# Patient Record
Sex: Male | Born: 1965
Health system: Southern US, Community
[De-identification: ages and names within clinical notes are randomized; demographics above are authoritative.]

## PROBLEM LIST (undated history)

## (undated) DIAGNOSIS — I739 Peripheral vascular disease, unspecified: Secondary | ICD-10-CM

## (undated) DIAGNOSIS — J449 Chronic obstructive pulmonary disease, unspecified: Secondary | ICD-10-CM

## (undated) DIAGNOSIS — N289 Disorder of kidney and ureter, unspecified: Secondary | ICD-10-CM

## (undated) DIAGNOSIS — T8859XA Other complications of anesthesia, initial encounter: Secondary | ICD-10-CM

## (undated) DIAGNOSIS — M1990 Unspecified osteoarthritis, unspecified site: Secondary | ICD-10-CM

## (undated) DIAGNOSIS — D62 Acute posthemorrhagic anemia: Secondary | ICD-10-CM

## (undated) DIAGNOSIS — I6529 Occlusion and stenosis of unspecified carotid artery: Secondary | ICD-10-CM

## (undated) DIAGNOSIS — E785 Hyperlipidemia, unspecified: Secondary | ICD-10-CM

## (undated) DIAGNOSIS — Z7982 Long term (current) use of aspirin: Secondary | ICD-10-CM

## (undated) DIAGNOSIS — I2510 Atherosclerotic heart disease of native coronary artery without angina pectoris: Secondary | ICD-10-CM

## (undated) DIAGNOSIS — Z720 Tobacco use: Secondary | ICD-10-CM

## (undated) DIAGNOSIS — Z7902 Long term (current) use of antithrombotics/antiplatelets: Secondary | ICD-10-CM

## (undated) DIAGNOSIS — E119 Type 2 diabetes mellitus without complications: Secondary | ICD-10-CM

## (undated) DIAGNOSIS — T7840XA Allergy, unspecified, initial encounter: Secondary | ICD-10-CM

## (undated) DIAGNOSIS — IMO0002 Reserved for concepts with insufficient information to code with codable children: Secondary | ICD-10-CM

## (undated) DIAGNOSIS — I7772 Dissection of iliac artery: Secondary | ICD-10-CM

## (undated) DIAGNOSIS — I700 Atherosclerosis of aorta: Secondary | ICD-10-CM

## (undated) DIAGNOSIS — K219 Gastro-esophageal reflux disease without esophagitis: Secondary | ICD-10-CM

## (undated) DIAGNOSIS — N400 Enlarged prostate without lower urinary tract symptoms: Secondary | ICD-10-CM

## (undated) DIAGNOSIS — J45909 Unspecified asthma, uncomplicated: Secondary | ICD-10-CM

## (undated) DIAGNOSIS — I779 Disorder of arteries and arterioles, unspecified: Secondary | ICD-10-CM

## (undated) DIAGNOSIS — I10 Essential (primary) hypertension: Secondary | ICD-10-CM

## (undated) DIAGNOSIS — T4145XA Adverse effect of unspecified anesthetic, initial encounter: Secondary | ICD-10-CM

## (undated) DIAGNOSIS — R0989 Other specified symptoms and signs involving the circulatory and respiratory systems: Secondary | ICD-10-CM

## (undated) HISTORY — DX: Unspecified asthma, uncomplicated: J45.909

## (undated) HISTORY — PX: BACK SURGERY: SHX140

## (undated) HISTORY — DX: Reserved for concepts with insufficient information to code with codable children: IMO0002

## (undated) HISTORY — DX: Essential (primary) hypertension: I10

## (undated) HISTORY — DX: Gastro-esophageal reflux disease without esophagitis: K21.9

## (undated) HISTORY — DX: Unspecified osteoarthritis, unspecified site: M19.90

## (undated) HISTORY — DX: Acute posthemorrhagic anemia: D62

## (undated) HISTORY — DX: Allergy, unspecified, initial encounter: T78.40XA

## (undated) HISTORY — PX: SPINE SURGERY: SHX786

---
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
## 2003-11-15 ENCOUNTER — Emergency Department (HOSPITAL_COMMUNITY): Admission: EM | Admit: 2003-11-15 | Discharge: 2003-11-15 | Payer: Self-pay | Admitting: Emergency Medicine

## 2004-07-11 ENCOUNTER — Emergency Department: Payer: Self-pay | Admitting: Emergency Medicine

## 2005-01-13 ENCOUNTER — Emergency Department (HOSPITAL_COMMUNITY): Admission: EM | Admit: 2005-01-13 | Discharge: 2005-01-13 | Payer: Self-pay | Admitting: Emergency Medicine

## 2006-01-14 ENCOUNTER — Emergency Department: Payer: Self-pay | Admitting: Internal Medicine

## 2006-01-23 ENCOUNTER — Ambulatory Visit: Payer: Self-pay | Admitting: Internal Medicine

## 2006-01-26 ENCOUNTER — Emergency Department: Payer: Self-pay | Admitting: Emergency Medicine

## 2006-11-22 ENCOUNTER — Inpatient Hospital Stay: Payer: Self-pay | Admitting: Internal Medicine

## 2006-11-22 ENCOUNTER — Other Ambulatory Visit: Payer: Self-pay

## 2006-11-25 ENCOUNTER — Inpatient Hospital Stay: Payer: Self-pay | Admitting: Unknown Physician Specialty

## 2007-12-02 ENCOUNTER — Ambulatory Visit: Payer: Self-pay | Admitting: Physician Assistant

## 2010-03-21 ENCOUNTER — Ambulatory Visit: Payer: Self-pay | Admitting: Neurosurgery

## 2010-07-04 ENCOUNTER — Other Ambulatory Visit (HOSPITAL_COMMUNITY): Payer: Self-pay | Admitting: Neurosurgery

## 2010-07-04 ENCOUNTER — Ambulatory Visit (HOSPITAL_COMMUNITY)
Admission: RE | Admit: 2010-07-04 | Discharge: 2010-07-04 | Disposition: A | Discharge status: Home / Self Care | Payer: Medicare Other | Source: Ambulatory Visit | Attending: Neurosurgery | Admitting: Neurosurgery

## 2010-07-04 ENCOUNTER — Encounter (HOSPITAL_COMMUNITY)
Admission: RE | Admit: 2010-07-04 | Discharge: 2010-07-04 | Disposition: A | Discharge status: Home / Self Care | Payer: Medicare Other | Source: Ambulatory Visit | Attending: Neurosurgery | Admitting: Neurosurgery

## 2010-07-04 DIAGNOSIS — I10 Essential (primary) hypertension: Secondary | ICD-10-CM | POA: Insufficient documentation

## 2010-07-04 DIAGNOSIS — Z01818 Encounter for other preprocedural examination: Secondary | ICD-10-CM | POA: Insufficient documentation

## 2010-07-04 DIAGNOSIS — Z01812 Encounter for preprocedural laboratory examination: Secondary | ICD-10-CM | POA: Insufficient documentation

## 2010-07-04 DIAGNOSIS — F17200 Nicotine dependence, unspecified, uncomplicated: Secondary | ICD-10-CM | POA: Insufficient documentation

## 2010-07-04 DIAGNOSIS — J438 Other emphysema: Secondary | ICD-10-CM | POA: Insufficient documentation

## 2010-07-04 DIAGNOSIS — Z01811 Encounter for preprocedural respiratory examination: Secondary | ICD-10-CM | POA: Insufficient documentation

## 2010-07-04 DIAGNOSIS — M5126 Other intervertebral disc displacement, lumbar region: Secondary | ICD-10-CM

## 2010-07-04 LAB — CBC
HCT: 46.3 % (ref 39.0–52.0)
Hemoglobin: 16 g/dL (ref 13.0–17.0)
MCH: 31.5 pg (ref 26.0–34.0)
MCV: 91.1 fL (ref 78.0–100.0)
Platelets: 407 10*3/uL — ABNORMAL HIGH (ref 150–400)
RBC: 5.08 MIL/uL (ref 4.22–5.81)
RDW: 13.3 % (ref 11.5–15.5)
WBC: 11 10*3/uL — ABNORMAL HIGH (ref 4.0–10.5)

## 2010-07-04 LAB — SURGICAL PCR SCREEN
MRSA, PCR: NEGATIVE
Staphylococcus aureus: NEGATIVE

## 2010-07-04 LAB — BASIC METABOLIC PANEL
BUN: 8 mg/dL (ref 6–23)
Calcium: 9.5 mg/dL (ref 8.4–10.5)
Chloride: 108 mEq/L (ref 96–112)
Creatinine, Ser: 0.71 mg/dL (ref 0.4–1.5)
GFR calc Af Amer: >60 mL/min (ref >60)
GFR calc non Af Amer: >60 mL/min (ref >60)
Glucose, Bld: 162 mg/dL — ABNORMAL HIGH (ref 70–99)
Potassium: 4.7 mEq/L (ref 3.5–5.1)
Sodium: 136 mEq/L (ref 135–145)

## 2010-07-09 ENCOUNTER — Inpatient Hospital Stay (HOSPITAL_COMMUNITY): Payer: Medicare Other

## 2010-07-09 ENCOUNTER — Inpatient Hospital Stay (HOSPITAL_COMMUNITY)
Admission: RE | Admit: 2010-07-09 | Discharge: 2010-07-11 | DRG: 460 | Disposition: A | Discharge status: Home Health | Payer: Medicare Other | Source: Ambulatory Visit | Attending: Neurosurgery | Admitting: Neurosurgery

## 2010-07-09 DIAGNOSIS — M51379 Other intervertebral disc degeneration, lumbosacral region without mention of lumbar back pain or lower extremity pain: Secondary | ICD-10-CM | POA: Diagnosis present

## 2010-07-09 DIAGNOSIS — I10 Essential (primary) hypertension: Secondary | ICD-10-CM | POA: Diagnosis present

## 2010-07-09 DIAGNOSIS — J449 Chronic obstructive pulmonary disease, unspecified: Secondary | ICD-10-CM | POA: Diagnosis present

## 2010-07-09 DIAGNOSIS — Z01810 Encounter for preprocedural cardiovascular examination: Secondary | ICD-10-CM

## 2010-07-09 DIAGNOSIS — J4489 Other specified chronic obstructive pulmonary disease: Secondary | ICD-10-CM | POA: Diagnosis present

## 2010-07-09 DIAGNOSIS — Z01812 Encounter for preprocedural laboratory examination: Secondary | ICD-10-CM

## 2010-07-09 DIAGNOSIS — F17200 Nicotine dependence, unspecified, uncomplicated: Secondary | ICD-10-CM | POA: Diagnosis present

## 2010-07-09 DIAGNOSIS — E119 Type 2 diabetes mellitus without complications: Secondary | ICD-10-CM | POA: Diagnosis present

## 2010-07-09 DIAGNOSIS — M5126 Other intervertebral disc displacement, lumbar region: Principal | ICD-10-CM | POA: Diagnosis present

## 2010-07-09 DIAGNOSIS — M5137 Other intervertebral disc degeneration, lumbosacral region: Secondary | ICD-10-CM | POA: Diagnosis present

## 2010-07-09 LAB — GLUCOSE, CAPILLARY
Glucose-Capillary: 102 mg/dL — ABNORMAL HIGH (ref 70–99)
Glucose-Capillary: 122 mg/dL — ABNORMAL HIGH (ref 70–99)
Glucose-Capillary: 165 mg/dL — ABNORMAL HIGH (ref 70–99)

## 2010-07-09 LAB — ABO/RH: ABO/RH(D): O POS

## 2010-07-09 LAB — TYPE AND SCREEN
ABO/RH(D): O POS
Antibody Screen: NEGATIVE

## 2010-07-10 LAB — POCT I-STAT 4, (NA,K, GLUC, HGB,HCT)
Glucose, Bld: 120 mg/dL — ABNORMAL HIGH (ref 70–99)
HCT: 36 % — ABNORMAL LOW (ref 39.0–52.0)
Hemoglobin: 12.2 g/dL — ABNORMAL LOW (ref 13.0–17.0)
Potassium: 4.3 mEq/L (ref 3.5–5.1)
Sodium: 139 mEq/L (ref 135–145)

## 2010-07-10 LAB — GLUCOSE, CAPILLARY
Glucose-Capillary: 159 mg/dL — ABNORMAL HIGH (ref 70–99)
Glucose-Capillary: 182 mg/dL — ABNORMAL HIGH (ref 70–99)
Glucose-Capillary: 121 mg/dL — ABNORMAL HIGH (ref 70–99)

## 2010-07-11 LAB — GLUCOSE, CAPILLARY
Glucose-Capillary: 136 mg/dL — ABNORMAL HIGH (ref 70–99)
Glucose-Capillary: 146 mg/dL — ABNORMAL HIGH (ref 70–99)

## 2010-07-12 NOTE — Op Note (Signed)
NAMEMELQUISEDEC, JOURNEY              ACCOUNT NO.:  [id number]  MEDICAL RECORD NO.:  [id number]           PATIENT TYPE:  I  LOCATION:  3013                         FACILITY:  [facility]  PHYSICIAN:  Donalee Citrin, M.D.        DATE OF BIRTH:  05-Aug-1965  DATE OF PROCEDURE:  07/09/2010 DATE OF DISCHARGE:                              OPERATIVE REPORT   PREOPERATIVE DIAGNOSIS:  Recurrent disk herniation, L4-L5 with lumbar spinal stenosis and left L5 radiculopathy.  PROCEDURE:  Redo decompressive laminectomy and posterior lumbar interbody fusion at L4-L5 using hybrid Telamon PEEK cage and Tangent allograft wedge.  Pedicle screw fixation at L4-L5 using the Globus REVERE pedicle screw system.  Posterolateral arthrodesis at L4-L5 using locally harvested autograft mixed with Actifuse and placement of large Hemovac drain.  SURGEON:  Donalee Citrin, MD  ASSISTANT:  Kathaleen Maser. Pool, MD  ANESTHESIA:  General endotracheal.  HISTORY OF PRESENT ILLNESS:  The patient is a 45 year old gentleman who underwent previous laminectomies with diskectomy many years ago and the patient had persistent and progressive back and left leg pain consistent with an L5 nerve root pattern.  MRI scan showed large recurrent disk herniation and severe degeneration of the flaps around the disk space. The patient was recommended reexploration, redo laminectomy, redo diskectomy, and interbody fusion at this level.  I went over the risks and benefits of the operation with him, he understood and agreed to proceed forward.  DESCRIPTION OF PROCEDURE:  The patient was brought to the OR, induced under general anesthesia, and positioned prone on the Wilson frame. Back was prepped and draped in the usual sterile fashion.  Old incision was opened up on the left side.  Scar tissue was dissected free and subperiosteal dissection carried out at lamina of L4-L5 bilaterally.  T- piece at L4-L5 were exposed.  Intraoperative x-ray identified  the appropriate level.  Spinous process was removed at L4.  A complete central decompression was begun first working on the right side where no previous surgery had been performed.  Complete medial facetectomy was performed decompressing the L4 and L5 on that side.  Then working through the scar, extensive amount of scar tissue causing severe stenosis around the L5 and a large recurrent disk herniation encased in scar were underneath the L5.  So working underneath the scar with sharp and blunt dissection, the L5 nerve was freed up off the pedicle and freed up off the recurrent disk herniation which was removed with a nerve hook and pituitary rongeurs.  The thecal sac was then mobilized and after the L4 and L5 were then exposed on the left and adequate decompression, diskectomy had been performed, attention was taken to the pedicle screw placement.  Using high-speed drill, pilot holes were drilled at L4, cannulated with the awl, probed, tapped with 5.5 tap, probed again, and 6 x 45 screw inserted at L4 on the left.  Again fluoroscopy was used at each step along the way as well as external and internal bony landmarks were confirmed no mediolateral breach.  Then the L5 screws were placed in the left and L4-L5 screws were placed on  the right.  After all 4 screws were placed, attention was taken to the interbody work.  The size 12 distractor was inserted on the left side L4- L5.  This was felt to be appropriate size and had good apposition of the endplates.  Disk space was then cleaned out in the right with 12 sized rotating cutter and chisel.  Then a Tangent allograft wedge was inserted on the right side and distractor was removed.  Fluoroscopy confirmed good position of the implant.  Disk space was then cleaned on the left, again with a rotating cutter and chisel.  Local autograft mixed with Actifuse and packed centrally after aggressive scraping of the central endplates with a 0-Epstein  curette.  Then a Telamon PEEK cage 10 x 20 mm packed with locally harvested autograft and mixed with Actifuse was inserted on the patient's left side.  Then after all the interbody workup was done, the wound was copiously irrigated.  Meticulous hemostasis was maintained.  Aggressive decortication was carried at T- piece and lateral gutters.  The remainder of the autograft mixed with Actifuse was then packed posterolaterally.  Then 45 mm rods were then placed and top-tighteners set down at L5.  L4 was compressed against L5. Postop fluoroscopy confirmed good position of the rods and implants. The foramina were reexplored and looked widely patent.  Gelfoam was laid on top of the dura and the wound was closed in layers with Vicryl after placement of a large Hemovac drain and the skin with 4-0 subcuticular. Benzoin and Steri-Strips were applied.  The patient went to the recovery room in stable condition.  At the end of the case, sponge, needle, and instrument counts were correct.          ______________________________ Donalee Citrin, M.D.     GC/MEDQ  D:  07/09/2010  T:  07/10/2010  Job:  [id number]  Electronically Signed by Donalee Citrin M.D. on 07/12/2010 11:26:57 AM

## 2010-07-12 NOTE — Discharge Summary (Signed)
  [name], Benjamin Conley              ACCOUNT NO.:  [id number]  MEDICAL RECORD NO.:  [id number]           PATIENT TYPE:  I  LOCATION:  3013                         FACILITY:  [facility]  PHYSICIAN:  Donalee Citrin, M.D.        DATE OF BIRTH:  1965/05/31  DATE OF ADMISSION:  07/09/2010 DATE OF DISCHARGE:  07/11/2010                              DISCHARGE SUMMARY   ADMITTING DIAGNOSIS:  Degenerative disk disease, recurrent herniated nucleus pulposus at L4-5.  PROCEDURE:  During this hospitalization was redo decompressive lumbar laminectomy and microdiskectomy with posterior lumbar interbody fusion, L4-5.  HOSPITAL COURSE:  The patient was admitted and immediately went the operating room and underwent the aforementioned procedure. Postoperatively, the patient did very well, had significant improvement in preoperative pain in his legs, progressively mobilized over the first couple of postoperative days and by the time of discharge, he was ambulating, voiding spontaneously, tolerating a regular diet, tolerating pain on p.o. pain medication, wound was clean and dry.  The patient was able to be discharged home with scheduled followup in approximately 1-2 weeks.  He was discharged on Percocet and Flexeril.  FINAL DIAGNOSIS:  Recurrent herniated nucleus pulposus at L4-5 and degenerative disk disease.          ______________________________ Donalee Citrin, M.D.     GC/MEDQ  D:  07/11/2010  T:  07/11/2010  Job:  [id number]  Electronically Signed by Donalee Citrin M.D. on 07/12/2010 11:27:00 AM

## 2010-08-09 ENCOUNTER — Ambulatory Visit
Admission: RE | Admit: 2010-08-09 | Discharge: 2010-08-09 | Disposition: A | Discharge status: Home / Self Care | Payer: Medicare Other | Source: Ambulatory Visit | Attending: Neurosurgery | Admitting: Neurosurgery

## 2010-08-09 ENCOUNTER — Other Ambulatory Visit: Payer: Self-pay | Admitting: Neurosurgery

## 2010-08-09 DIAGNOSIS — M545 Low back pain, unspecified: Secondary | ICD-10-CM

## 2010-12-05 ENCOUNTER — Ambulatory Visit: Payer: Self-pay | Admitting: Neurosurgery

## 2011-10-07 ENCOUNTER — Ambulatory Visit: Payer: Self-pay | Admitting: Neurosurgery

## 2013-08-30 ENCOUNTER — Emergency Department: Payer: Self-pay | Admitting: Emergency Medicine

## 2013-08-30 LAB — CBC
HCT: 47.8 % (ref 40.0–52.0)
HGB: 16 g/dL (ref 13.0–18.0)
MCH: 30.3 pg (ref 26.0–34.0)
MCHC: 33.5 g/dL (ref 32.0–36.0)
MCV: 90 fL (ref 80–100)
Platelet: 358 10*3/uL (ref 150–440)
RBC: 5.29 10*6/uL (ref 4.40–5.90)
RDW: 13.5 % (ref 11.5–14.5)
WBC: 10.5 10*3/uL (ref 3.8–10.6)

## 2013-08-31 LAB — BASIC METABOLIC PANEL
Anion Gap: 13 (ref 7–16)
BUN: 13 mg/dL (ref 7–18)
CHLORIDE: 101 mmol/L (ref 98–107)
Calcium, Total: 9.1 mg/dL (ref 8.5–10.1)
Co2: 23 mmol/L (ref 21–32)
Creatinine: 0.69 mg/dL (ref 0.60–1.30)
EGFR (African American): >60
EGFR (Non-African Amer.): >60
Glucose: 374 mg/dL — ABNORMAL HIGH (ref 65–99)
Osmolality: 289 (ref 275–301)
Potassium: 3.5 mmol/L (ref 3.5–5.1)
Sodium: 137 mmol/L (ref 136–145)

## 2013-08-31 LAB — TROPONIN I: Troponin-I: <0.02 ng/mL

## 2013-11-10 DIAGNOSIS — I10 Essential (primary) hypertension: Secondary | ICD-10-CM | POA: Insufficient documentation

## 2014-03-18 DIAGNOSIS — E119 Type 2 diabetes mellitus without complications: Secondary | ICD-10-CM | POA: Diagnosis not present

## 2014-03-18 DIAGNOSIS — E785 Hyperlipidemia, unspecified: Secondary | ICD-10-CM | POA: Diagnosis not present

## 2014-04-28 DIAGNOSIS — M5416 Radiculopathy, lumbar region: Secondary | ICD-10-CM | POA: Diagnosis not present

## 2014-04-28 DIAGNOSIS — Z6833 Body mass index (BMI) 33.0-33.9, adult: Secondary | ICD-10-CM | POA: Diagnosis not present

## 2014-04-28 DIAGNOSIS — M5137 Other intervertebral disc degeneration, lumbosacral region: Secondary | ICD-10-CM | POA: Diagnosis not present

## 2014-05-06 DIAGNOSIS — M5416 Radiculopathy, lumbar region: Secondary | ICD-10-CM | POA: Diagnosis not present

## 2014-05-12 DIAGNOSIS — M5126 Other intervertebral disc displacement, lumbar region: Secondary | ICD-10-CM | POA: Diagnosis not present

## 2014-05-12 DIAGNOSIS — M5416 Radiculopathy, lumbar region: Secondary | ICD-10-CM | POA: Diagnosis not present

## 2014-05-12 DIAGNOSIS — Z6834 Body mass index (BMI) 34.0-34.9, adult: Secondary | ICD-10-CM | POA: Diagnosis not present

## 2014-05-12 DIAGNOSIS — M4806 Spinal stenosis, lumbar region: Secondary | ICD-10-CM | POA: Diagnosis not present

## 2014-05-12 DIAGNOSIS — M5137 Other intervertebral disc degeneration, lumbosacral region: Secondary | ICD-10-CM | POA: Diagnosis not present

## 2014-05-30 ENCOUNTER — Encounter
Admit: 2014-05-30 | Disposition: A | Discharge status: Home / Self Care | Payer: Self-pay | Attending: Neurosurgery | Admitting: Neurosurgery

## 2014-05-30 DIAGNOSIS — R262 Difficulty in walking, not elsewhere classified: Secondary | ICD-10-CM | POA: Diagnosis not present

## 2014-05-30 DIAGNOSIS — M5418 Radiculopathy, sacral and sacrococcygeal region: Secondary | ICD-10-CM | POA: Diagnosis not present

## 2014-05-30 DIAGNOSIS — M545 Low back pain: Secondary | ICD-10-CM | POA: Diagnosis not present

## 2014-06-02 DIAGNOSIS — R262 Difficulty in walking, not elsewhere classified: Secondary | ICD-10-CM | POA: Diagnosis not present

## 2014-06-02 DIAGNOSIS — M545 Low back pain: Secondary | ICD-10-CM | POA: Diagnosis not present

## 2014-06-02 DIAGNOSIS — M5418 Radiculopathy, sacral and sacrococcygeal region: Secondary | ICD-10-CM | POA: Diagnosis not present

## 2014-06-06 DIAGNOSIS — M5418 Radiculopathy, sacral and sacrococcygeal region: Secondary | ICD-10-CM | POA: Diagnosis not present

## 2014-06-06 DIAGNOSIS — M545 Low back pain: Secondary | ICD-10-CM | POA: Diagnosis not present

## 2014-06-06 DIAGNOSIS — R262 Difficulty in walking, not elsewhere classified: Secondary | ICD-10-CM | POA: Diagnosis not present

## 2014-06-09 DIAGNOSIS — R262 Difficulty in walking, not elsewhere classified: Secondary | ICD-10-CM | POA: Diagnosis not present

## 2014-06-09 DIAGNOSIS — M545 Low back pain: Secondary | ICD-10-CM | POA: Diagnosis not present

## 2014-06-09 DIAGNOSIS — M5418 Radiculopathy, sacral and sacrococcygeal region: Secondary | ICD-10-CM | POA: Diagnosis not present

## 2014-06-10 ENCOUNTER — Encounter
Admit: 2014-06-10 | Disposition: A | Discharge status: Home / Self Care | Payer: Self-pay | Attending: Neurosurgery | Admitting: Neurosurgery

## 2014-06-13 DIAGNOSIS — R262 Difficulty in walking, not elsewhere classified: Secondary | ICD-10-CM | POA: Diagnosis not present

## 2014-06-13 DIAGNOSIS — M545 Low back pain: Secondary | ICD-10-CM | POA: Diagnosis not present

## 2014-06-13 DIAGNOSIS — M5416 Radiculopathy, lumbar region: Secondary | ICD-10-CM | POA: Diagnosis not present

## 2014-06-13 DIAGNOSIS — M79605 Pain in left leg: Secondary | ICD-10-CM | POA: Diagnosis not present

## 2014-06-15 DIAGNOSIS — M79605 Pain in left leg: Secondary | ICD-10-CM | POA: Diagnosis not present

## 2014-06-15 DIAGNOSIS — M545 Low back pain: Secondary | ICD-10-CM | POA: Diagnosis not present

## 2014-06-15 DIAGNOSIS — R262 Difficulty in walking, not elsewhere classified: Secondary | ICD-10-CM | POA: Diagnosis not present

## 2014-06-15 DIAGNOSIS — M5416 Radiculopathy, lumbar region: Secondary | ICD-10-CM | POA: Diagnosis not present

## 2014-06-20 DIAGNOSIS — M5416 Radiculopathy, lumbar region: Secondary | ICD-10-CM | POA: Diagnosis not present

## 2014-06-20 DIAGNOSIS — M79605 Pain in left leg: Secondary | ICD-10-CM | POA: Diagnosis not present

## 2014-06-20 DIAGNOSIS — M545 Low back pain: Secondary | ICD-10-CM | POA: Diagnosis not present

## 2014-06-20 DIAGNOSIS — R262 Difficulty in walking, not elsewhere classified: Secondary | ICD-10-CM | POA: Diagnosis not present

## 2014-06-22 DIAGNOSIS — R262 Difficulty in walking, not elsewhere classified: Secondary | ICD-10-CM | POA: Diagnosis not present

## 2014-06-22 DIAGNOSIS — M79605 Pain in left leg: Secondary | ICD-10-CM | POA: Diagnosis not present

## 2014-06-22 DIAGNOSIS — M5416 Radiculopathy, lumbar region: Secondary | ICD-10-CM | POA: Diagnosis not present

## 2014-06-22 DIAGNOSIS — M545 Low back pain: Secondary | ICD-10-CM | POA: Diagnosis not present

## 2014-06-23 DIAGNOSIS — M5137 Other intervertebral disc degeneration, lumbosacral region: Secondary | ICD-10-CM | POA: Diagnosis not present

## 2014-07-18 DIAGNOSIS — E119 Type 2 diabetes mellitus without complications: Secondary | ICD-10-CM | POA: Diagnosis not present

## 2014-07-18 DIAGNOSIS — E785 Hyperlipidemia, unspecified: Secondary | ICD-10-CM | POA: Diagnosis not present

## 2014-07-25 DIAGNOSIS — E785 Hyperlipidemia, unspecified: Secondary | ICD-10-CM | POA: Diagnosis not present

## 2014-07-25 DIAGNOSIS — Z0000 Encounter for general adult medical examination without abnormal findings: Secondary | ICD-10-CM | POA: Diagnosis not present

## 2014-07-25 DIAGNOSIS — E119 Type 2 diabetes mellitus without complications: Secondary | ICD-10-CM | POA: Diagnosis not present

## 2014-08-02 DIAGNOSIS — Z0000 Encounter for general adult medical examination without abnormal findings: Secondary | ICD-10-CM | POA: Diagnosis not present

## 2014-08-02 DIAGNOSIS — Z23 Encounter for immunization: Secondary | ICD-10-CM | POA: Diagnosis not present

## 2014-09-27 DIAGNOSIS — M5126 Other intervertebral disc displacement, lumbar region: Secondary | ICD-10-CM | POA: Diagnosis not present

## 2014-11-02 ENCOUNTER — Emergency Department
Admission: EM | Admit: 2014-11-02 | Discharge: 2014-11-02 | Disposition: A | Discharge status: Home / Self Care | Payer: Commercial Managed Care - HMO | Attending: Emergency Medicine | Admitting: Emergency Medicine

## 2014-11-02 DIAGNOSIS — R609 Edema, unspecified: Secondary | ICD-10-CM | POA: Diagnosis not present

## 2014-11-02 DIAGNOSIS — Z79899 Other long term (current) drug therapy: Secondary | ICD-10-CM | POA: Diagnosis not present

## 2014-11-02 DIAGNOSIS — Y92008 Other place in unspecified non-institutional (private) residence as the place of occurrence of the external cause: Secondary | ICD-10-CM | POA: Insufficient documentation

## 2014-11-02 DIAGNOSIS — Y998 Other external cause status: Secondary | ICD-10-CM | POA: Diagnosis not present

## 2014-11-02 DIAGNOSIS — E119 Type 2 diabetes mellitus without complications: Secondary | ICD-10-CM | POA: Diagnosis not present

## 2014-11-02 DIAGNOSIS — T782XXA Anaphylactic shock, unspecified, initial encounter: Secondary | ICD-10-CM

## 2014-11-02 DIAGNOSIS — R0600 Dyspnea, unspecified: Secondary | ICD-10-CM | POA: Diagnosis present

## 2014-11-02 DIAGNOSIS — Y9389 Activity, other specified: Secondary | ICD-10-CM | POA: Diagnosis not present

## 2014-11-02 DIAGNOSIS — T63481A Toxic effect of venom of other arthropod, accidental (unintentional), initial encounter: Secondary | ICD-10-CM

## 2014-11-02 DIAGNOSIS — Z720 Tobacco use: Secondary | ICD-10-CM | POA: Insufficient documentation

## 2014-11-02 DIAGNOSIS — T63461A Toxic effect of venom of wasps, accidental (unintentional), initial encounter: Secondary | ICD-10-CM | POA: Diagnosis not present

## 2014-11-02 DIAGNOSIS — T63441A Toxic effect of venom of bees, accidental (unintentional), initial encounter: Secondary | ICD-10-CM | POA: Diagnosis not present

## 2014-11-02 DIAGNOSIS — W57XXXA Bitten or stung by nonvenomous insect and other nonvenomous arthropods, initial encounter: Secondary | ICD-10-CM | POA: Diagnosis not present

## 2014-11-02 HISTORY — DX: Type 2 diabetes mellitus without complications: E11.9

## 2014-11-02 HISTORY — DX: Hyperlipidemia, unspecified: E78.5

## 2014-11-02 MED ORDER — EPINEPHRINE 0.3 MG/0.3ML IJ SOAJ: 0.3000 mg | Freq: Once | INTRAMUSCULAR | Status: DC

## 2014-11-02 MED ORDER — METHYLPREDNISOLONE SODIUM SUCC 125 MG IJ SOLR
125.0000 mg | Freq: Once | INTRAMUSCULAR | Status: AC
  Administered 2014-11-02: 125 mg via INTRAVENOUS

## 2014-11-02 MED ORDER — EPINEPHRINE HCL 1 MG/ML IJ SOLN
INTRAMUSCULAR | Status: AC
  Administered 2014-11-02: 0.3 mg via INTRAMUSCULAR
  Filled 2014-11-02: qty 1

## 2014-11-02 MED ORDER — DIPHENHYDRAMINE HCL 25 MG PO TABS: 25.0000 mg | ORAL_TABLET | Freq: Four times a day (QID) | ORAL | Status: DC | PRN

## 2014-11-02 MED ORDER — FAMOTIDINE IN NACL 20-0.9 MG/50ML-% IV SOLN
20.0000 mg | Freq: Once | INTRAVENOUS | Status: AC
  Administered 2014-11-02: 20 mg via INTRAVENOUS
  Filled 2014-11-02: qty 50

## 2014-11-02 MED ORDER — METHYLPREDNISOLONE SODIUM SUCC 125 MG IJ SOLR
INTRAMUSCULAR | Status: AC
  Administered 2014-11-02: 125 mg via INTRAVENOUS
  Filled 2014-11-02: qty 2

## 2014-11-02 MED ORDER — PREDNISONE 20 MG PO TABS: 40.0000 mg | ORAL_TABLET | Freq: Every day | ORAL | Status: DC

## 2014-11-02 MED ORDER — EPINEPHRINE HCL 1 MG/ML IJ SOLN
0.3000 mg | Freq: Once | INTRAMUSCULAR | Status: AC
  Administered 2014-11-02: 0.3 mg via INTRAMUSCULAR

## 2014-11-02 NOTE — Discharge Instructions (Signed)
Anaphylactic Reaction °An anaphylactic reaction is a sudden, severe allergic reaction that involves the whole body. It can be life threatening. A hospital stay is often required. People with asthma, eczema, or hay fever are slightly more likely to have an anaphylactic reaction. °CAUSES  °An anaphylactic reaction may be caused by anything to which you are allergic. After being exposed to the allergic substance, your immune system becomes sensitized to it. When you are exposed to that allergic substance again, an allergic reaction can occur. Common causes of an anaphylactic reaction include: °· Medicines. °· Foods, especially peanuts, wheat, shellfish, milk, and eggs. °· Insect bites or stings. °· Blood products. °· Chemicals, such as dyes, latex, and contrast material used for imaging tests. °SYMPTOMS  °When an allergic reaction occurs, the body releases histamine and other substances. These substances cause symptoms such as tightening of the airway. Symptoms often develop within seconds or minutes of exposure. Symptoms may include: °· Skin rash or hives. °· Itching. °· Chest tightness. °· Swelling of the eyes, tongue, or lips. °· Trouble breathing or swallowing. °· Lightheadedness or fainting. °· Anxiety or confusion. °· Stomach pains, vomiting, or diarrhea. °· Nasal congestion. °· A fast or irregular heartbeat (palpitations). °DIAGNOSIS  °Diagnosis is based on your history of recent exposure to allergic substances, your symptoms, and a physical exam. Your caregiver may also perform blood or urine tests to confirm the diagnosis. °TREATMENT  °Epinephrine medicine is the main treatment for an anaphylactic reaction. Other medicines that may be used for treatment include antihistamines, steroids, and albuterol. In severe cases, fluids and medicine to support blood pressure may be given through an intravenous line (IV). Even if you improve after treatment, you need to be observed to make sure your condition does not get  worse. This may require a stay in the hospital. °HOME CARE INSTRUCTIONS  °· Wear a medical alert bracelet or necklace stating your allergy. °· You and your family must learn how to use an anaphylaxis kit or give an epinephrine injection to temporarily treat an emergency allergic reaction. Always carry your epinephrine injection or anaphylaxis kit with you. This can be lifesaving if you have a severe reaction. °· Do not drive or perform tasks after treatment until the medicines used to treat your reaction have worn off, or until your caregiver says it is okay. °· If you have hives or a rash: °¨ Take medicines as directed by your caregiver. °¨ You may use an over-the-counter antihistamine (diphenhydramine) as needed. °¨ Apply cold compresses to the skin or take baths in cool water. Avoid hot baths or showers. °SEEK MEDICAL CARE IF:  °· You develop symptoms of an allergic reaction to a new substance. Symptoms may start right away or minutes later. °· You develop a rash, hives, or itching. °· You develop new symptoms. °SEEK IMMEDIATE MEDICAL CARE IF:  °· You have swelling of the mouth, difficulty breathing, or wheezing. °· You have a tight feeling in your chest or throat. °· You develop hives, swelling, or itching all over your body. °· You develop severe vomiting or diarrhea. °· You feel faint or pass out. °This is an emergency. Use your epinephrine injection or anaphylaxis kit as you have been instructed. Call your local emergency services (911 in [place] Even if you improve after the injection, you need to be examined at a hospital emergency department. °MAKE SURE YOU:  °· Understand these instructions. °· Will watch your condition. °· Will get help right away if you are not   doing well or get worse. Document Released: 02/25/2005 Document Revised: 03/02/2013 Document Reviewed: 05/29/2011 [facility] Patient Information 2015 [place], [place]. This information is not intended to replace advice given to you by your health  care provider. Make sure you discuss any questions you have with your health care provider.

## 2014-11-02 NOTE — ED Provider Notes (Signed)
[facility] Emergency Department Provider Note  ____________________________________________  Time seen: 1:05 PM on arrival by EMS  I have reviewed the triage vital signs and the nursing notes.   HISTORY  Chief Complaint Allergic Reaction    HPI Benjamin Conley is a 49 y.o. male who was at home when he was stung by wasp from 12:30 PM today. He had the sensation of throat swelling and difficulty breathing, diffuse rapid onset of hives, and vomiting. Denies any abdominal pain chest pain or syncope. He is no known wasp allergy although he does note that this past weekend he was stung by a bee or wasp of some sort and had a very large localized reaction with redness and swelling.  EMS gave the patient 50 mg of oral Benadryl   Past Medical History  Diagnosis Date  . Diabetes mellitus without complication   . Hyperlipemia     There are no active problems to display for this patient.   Past Surgical History  Procedure Laterality Date  . Back surgery      lumbar    Current Outpatient Rx  Name  Route  Sig  Dispense  Refill  . atorvastatin (LIPITOR) 40 MG tablet   Oral   Take 40 mg by mouth daily.         . metFORMIN (GLUCOPHAGE) 500 MG tablet   Oral   Take by mouth 2 (two) times daily with a meal.         . diphenhydrAMINE (BENADRYL) 25 MG tablet   Oral   Take 1 tablet (25 mg total) by mouth every 6 (six) hours as needed.   30 tablet   0   . EPINEPHrine 0.3 mg/0.3 mL IJ SOAJ injection   Intramuscular   Inject 0.3 mLs (0.3 mg total) into the muscle once. Follow package instructions as needed for severe allergy or anaphylactic reaction.   1 Device   2   . predniSONE (DELTASONE) 20 MG tablet   Oral   Take 2 tablets (40 mg total) by mouth daily.   8 tablet   0     Allergies Wasp venom  No family history on file.  Social History Social History  Substance Use Topics  . Smoking status: Current Every Day Smoker -- 1.00 packs/day     Types: Cigarettes  . Smokeless tobacco: Never Used  . Alcohol Use: No    Review of Systems  Constitutional: No fever or chills. No weight changes Eyes:No blurry vision or double vision.  ENT: No sore throat. Feels like throat is swollen, difficulty swallowing Cardiovascular: No chest pain. Respiratory: Shortness of breath Gastrointestinal: No abdominal pain at this time, recent vomiting during EMS transport.  No BRBPR or melena. Genitourinary: Negative for dysuria, urinary retention, bloody urine, or difficulty urinating. Musculoskeletal: Negative for back pain. No joint swelling or pain. Skin: Negative for rash. Neurological: Negative for headaches, focal weakness or numbness. Psychiatric:No anxiety or depression.   Endocrine:No hot/cold intolerance, changes in energy, or sleep difficulty.  10-point ROS otherwise negative.  ____________________________________________   PHYSICAL EXAM:  VITAL SIGNS: ED Triage Vitals  Enc Vitals Group     BP 11/02/14 1317 137/106 mmHg     Pulse Rate 11/02/14 1315 128     Resp 11/02/14 1315 30     Temp 11/02/14 1317 97.6 F (36.4 C)     Temp Source 11/02/14 1317 Oral     SpO2 11/02/14 1315 96 %     Weight 11/02/14  1317 197 lb (89.359 kg)     Height 11/02/14 1317 5\' 6"  (1.676 m)     Head Cir --      Peak Flow --      Pain Score --      Pain Loc --      Pain Edu? --      Excl. in [place]? --      Constitutional: Alert and oriented. Moderate distress. Eyes: No scleral icterus. No conjunctival pallor. PERRL. EOMI ENT   Head: Normocephalic and atraumatic.   Nose: No congestion/rhinnorhea. No septal hematoma   Mouth/Throat: MMM, no pharyngeal erythema. No peritonsillar mass. No uvula shift.   Neck: No stridor. No SubQ emphysema. No meningismus. Hematological/Lymphatic/Immunilogical: No cervical lymphadenopathy. Cardiovascular: Tachycardia heart rate 1:30. Normal and symmetric distal pulses are present in all extremities.  No murmurs, rubs, or gallops. Respiratory: Tachypnea restaurant rate 26-30, no retractions Breath sounds are clear and equal bilaterally. No wheezes/rales/rhonchi. Gastrointestinal: Soft and nontender. No distention. There is no CVA tenderness.  No rebound, rigidity, or guarding. Genitourinary: deferred Musculoskeletal: Nontender with normal range of motion in all extremities. No joint effusions.  No lower extremity tenderness.  No edema. Neurologic:   Normal speech and language.  CN 2-10 normal. Motor grossly intact. No pronator drift.  Normal gait. No gross focal neurologic deficits are appreciated.  Skin:  Skin is warm, dry and intact. Diffuse urticarial rash over all extremities and the trunk. Psychiatric: Mood and affect are normal. Speech and behavior are normal. Patient exhibits appropriate insight and judgment.  ____________________________________________    LABS (pertinent positives/negatives) (all labs ordered are listed, but only abnormal results are displayed) Labs Reviewed - No data to display ____________________________________________   EKG    ____________________________________________    RADIOLOGY    ____________________________________________   PROCEDURES CRITICAL CARE Performed by: Joni Fears, Wanona Stare   Total critical care time: 35 minutes  Critical care time was exclusive of separately billable procedures and treating other patients.  Critical care was necessary to treat or prevent imminent or life-threatening deterioration.  Critical care was time spent personally by me on the following activities: development of treatment plan with patient and/or surrogate as well as nursing, discussions with consultants, evaluation of patient's response to treatment, examination of patient, obtaining history from patient or surrogate, ordering and performing treatments and interventions, ordering and review of laboratory studies, ordering and review of  radiographic studies, pulse oximetry and re-evaluation of patient's condition. ____________________________________________   INITIAL IMPRESSION / ASSESSMENT AND PLAN / ED COURSE  Pertinent labs & imaging results that were available during my care of the patient were reviewed by me and considered in my medical decision making (see chart for details).  Patient presents with anaphylaxis with multiorgan system involvement after being stung by likely allergen from a wasp. He is tachycardic and tachypnea, so treat him with IV Pepcid and Solu-Medrol and intramuscular epinephrine. He is artery given 50 mg of oral Benadryl by EMS, as we will hold off on more Benadryl and await the onset of the oral Benadryl. We'll continue to monitor the patient given IV fluids in the meantime.  ----------------------------------------- 2:48 PM on 11/02/2014 -----------------------------------------  Patient improved. His reassessed at 1:30 and 2:00 and continues improving. Rash is still present but slightly better, vital signs are improving with rest or at about 20 and heart rate about 110. Blood pressure remained stable on oxygen level is stable. We'll keep the patient on steroids and antihistamines and provide a prescription for EpiPen and  have him follow-up with his primary care doctor in a few days.  ____________________________________________   FINAL CLINICAL IMPRESSION(S) / ED DIAGNOSES  Final diagnoses:  Anaphylaxis due to hymenoptera venom, accidental or unintentional, initial encounter      Carrie Mew, MD 11/02/14 [phone]

## 2014-11-02 NOTE — ED Notes (Signed)
Pt rash has improved, respirations WNL, states his throat does not feel as swollen..will continue to monitor the pt.

## 2014-11-02 NOTE — ED Notes (Signed)
Pt comes into the ED via EMS from home with c/o being stung by a wasp today around 1230pm and had sudden onset hives with feeling like he throat was closing.Marland KitchenEMS reports giving the pt benadryl 50mg  PO in route.Marland Kitchenrespirations WNL on arrival with ST noted on the monitor at 145.[name]

## 2014-11-02 NOTE — ED Notes (Signed)
Pt O2 sats dropped to 89% on RA, pt placed on 3L [place] and sats returned to 93-95%.[name]

## 2014-11-21 DIAGNOSIS — E119 Type 2 diabetes mellitus without complications: Secondary | ICD-10-CM | POA: Diagnosis not present

## 2014-11-21 DIAGNOSIS — E785 Hyperlipidemia, unspecified: Secondary | ICD-10-CM | POA: Diagnosis not present

## 2014-11-25 DIAGNOSIS — E119 Type 2 diabetes mellitus without complications: Secondary | ICD-10-CM | POA: Diagnosis not present

## 2014-11-25 DIAGNOSIS — E785 Hyperlipidemia, unspecified: Secondary | ICD-10-CM | POA: Diagnosis not present

## 2014-11-25 DIAGNOSIS — Z794 Long term (current) use of insulin: Secondary | ICD-10-CM | POA: Diagnosis not present

## 2015-02-15 DIAGNOSIS — M79652 Pain in left thigh: Secondary | ICD-10-CM | POA: Diagnosis not present

## 2015-02-15 DIAGNOSIS — M25562 Pain in left knee: Secondary | ICD-10-CM | POA: Diagnosis not present

## 2015-03-22 DIAGNOSIS — I10 Essential (primary) hypertension: Secondary | ICD-10-CM | POA: Diagnosis not present

## 2015-03-22 DIAGNOSIS — Z0000 Encounter for general adult medical examination without abnormal findings: Secondary | ICD-10-CM | POA: Diagnosis not present

## 2015-03-22 DIAGNOSIS — E119 Type 2 diabetes mellitus without complications: Secondary | ICD-10-CM | POA: Diagnosis not present

## 2015-03-28 DIAGNOSIS — I10 Essential (primary) hypertension: Secondary | ICD-10-CM | POA: Diagnosis not present

## 2015-03-28 DIAGNOSIS — Z794 Long term (current) use of insulin: Secondary | ICD-10-CM | POA: Diagnosis not present

## 2015-03-28 DIAGNOSIS — E119 Type 2 diabetes mellitus without complications: Secondary | ICD-10-CM | POA: Diagnosis not present

## 2015-03-28 DIAGNOSIS — Z79899 Other long term (current) drug therapy: Secondary | ICD-10-CM | POA: Diagnosis not present

## 2015-06-19 DIAGNOSIS — E119 Type 2 diabetes mellitus without complications: Secondary | ICD-10-CM | POA: Diagnosis not present

## 2015-06-19 DIAGNOSIS — Z794 Long term (current) use of insulin: Secondary | ICD-10-CM | POA: Diagnosis not present

## 2015-06-19 DIAGNOSIS — Z79899 Other long term (current) drug therapy: Secondary | ICD-10-CM | POA: Diagnosis not present

## 2015-06-26 DIAGNOSIS — R05 Cough: Secondary | ICD-10-CM | POA: Diagnosis not present

## 2015-11-16 DIAGNOSIS — Z1211 Encounter for screening for malignant neoplasm of colon: Secondary | ICD-10-CM | POA: Diagnosis not present

## 2015-11-16 DIAGNOSIS — K625 Hemorrhage of anus and rectum: Secondary | ICD-10-CM | POA: Diagnosis not present

## 2015-11-16 DIAGNOSIS — R197 Diarrhea, unspecified: Secondary | ICD-10-CM | POA: Diagnosis not present

## 2015-11-30 DIAGNOSIS — J45991 Cough variant asthma: Secondary | ICD-10-CM | POA: Diagnosis not present

## 2015-11-30 DIAGNOSIS — Z01818 Encounter for other preprocedural examination: Secondary | ICD-10-CM | POA: Diagnosis not present

## 2015-11-30 DIAGNOSIS — R05 Cough: Secondary | ICD-10-CM | POA: Diagnosis not present

## 2016-03-12 ENCOUNTER — Ambulatory Visit: Admit: 2016-03-12 | Payer: Medicare Other | Admitting: Gastroenterology

## 2016-03-12 SURGERY — COLONOSCOPY WITH PROPOFOL
Anesthesia: General

## 2018-01-30 DIAGNOSIS — H1089 Other conjunctivitis: Secondary | ICD-10-CM | POA: Diagnosis not present

## 2018-02-02 DIAGNOSIS — Z0100 Encounter for examination of eyes and vision without abnormal findings: Secondary | ICD-10-CM | POA: Diagnosis not present

## 2018-02-02 DIAGNOSIS — H524 Presbyopia: Secondary | ICD-10-CM | POA: Diagnosis not present

## 2018-02-02 DIAGNOSIS — E119 Type 2 diabetes mellitus without complications: Secondary | ICD-10-CM | POA: Diagnosis not present

## 2018-03-30 DIAGNOSIS — M7540 Impingement syndrome of unspecified shoulder: Secondary | ICD-10-CM | POA: Insufficient documentation

## 2018-03-30 DIAGNOSIS — M25552 Pain in left hip: Secondary | ICD-10-CM | POA: Diagnosis not present

## 2018-03-30 DIAGNOSIS — M7542 Impingement syndrome of left shoulder: Secondary | ICD-10-CM | POA: Diagnosis not present

## 2018-03-30 DIAGNOSIS — M545 Low back pain: Secondary | ICD-10-CM | POA: Diagnosis not present

## 2018-04-08 DIAGNOSIS — M25512 Pain in left shoulder: Secondary | ICD-10-CM | POA: Diagnosis not present

## 2018-04-08 DIAGNOSIS — M25552 Pain in left hip: Secondary | ICD-10-CM | POA: Diagnosis not present

## 2018-04-08 DIAGNOSIS — M6281 Muscle weakness (generalized): Secondary | ICD-10-CM | POA: Diagnosis not present

## 2018-04-13 DIAGNOSIS — M25512 Pain in left shoulder: Secondary | ICD-10-CM | POA: Diagnosis not present

## 2018-04-13 DIAGNOSIS — M25552 Pain in left hip: Secondary | ICD-10-CM | POA: Diagnosis not present

## 2018-04-13 DIAGNOSIS — M6281 Muscle weakness (generalized): Secondary | ICD-10-CM | POA: Diagnosis not present

## 2018-04-15 DIAGNOSIS — M25512 Pain in left shoulder: Secondary | ICD-10-CM | POA: Diagnosis not present

## 2018-04-15 DIAGNOSIS — M25552 Pain in left hip: Secondary | ICD-10-CM | POA: Diagnosis not present

## 2018-04-15 DIAGNOSIS — M6281 Muscle weakness (generalized): Secondary | ICD-10-CM | POA: Diagnosis not present

## 2018-04-20 DIAGNOSIS — M25512 Pain in left shoulder: Secondary | ICD-10-CM | POA: Diagnosis not present

## 2018-04-20 DIAGNOSIS — M25552 Pain in left hip: Secondary | ICD-10-CM | POA: Diagnosis not present

## 2018-04-20 DIAGNOSIS — M6281 Muscle weakness (generalized): Secondary | ICD-10-CM | POA: Diagnosis not present

## 2018-04-22 DIAGNOSIS — M6281 Muscle weakness (generalized): Secondary | ICD-10-CM | POA: Diagnosis not present

## 2018-04-22 DIAGNOSIS — M25512 Pain in left shoulder: Secondary | ICD-10-CM | POA: Diagnosis not present

## 2018-04-22 DIAGNOSIS — M25552 Pain in left hip: Secondary | ICD-10-CM | POA: Diagnosis not present

## 2018-04-28 DIAGNOSIS — M6281 Muscle weakness (generalized): Secondary | ICD-10-CM | POA: Diagnosis not present

## 2018-04-28 DIAGNOSIS — M25512 Pain in left shoulder: Secondary | ICD-10-CM | POA: Diagnosis not present

## 2018-04-28 DIAGNOSIS — M25552 Pain in left hip: Secondary | ICD-10-CM | POA: Diagnosis not present

## 2018-04-28 DIAGNOSIS — M7542 Impingement syndrome of left shoulder: Secondary | ICD-10-CM | POA: Diagnosis not present

## 2018-05-25 ENCOUNTER — Encounter: Payer: Self-pay | Admitting: Emergency Medicine

## 2018-05-25 ENCOUNTER — Other Ambulatory Visit: Payer: Self-pay

## 2018-05-25 DIAGNOSIS — Z5321 Procedure and treatment not carried out due to patient leaving prior to being seen by health care provider: Secondary | ICD-10-CM | POA: Insufficient documentation

## 2018-05-25 DIAGNOSIS — R05 Cough: Secondary | ICD-10-CM | POA: Insufficient documentation

## 2018-05-25 LAB — INFLUENZA PANEL BY PCR (TYPE A & B)
INFLAPCR: NEGATIVE
INFLBPCR: NEGATIVE

## 2018-05-25 NOTE — ED Triage Notes (Signed)
Pt arrived to the ED for complaints of cough, runny nose and being exposed to someone with the flu. Pt denies fever. Pt is AOx4 in no apparent distress.

## 2018-05-26 ENCOUNTER — Emergency Department
Admission: EM | Admit: 2018-05-26 | Discharge: 2018-05-26 | Disposition: A | Discharge status: Home / Self Care | Payer: Medicare HMO | Attending: Emergency Medicine | Admitting: Emergency Medicine

## 2018-10-14 DIAGNOSIS — Z1389 Encounter for screening for other disorder: Secondary | ICD-10-CM | POA: Diagnosis not present

## 2018-10-14 DIAGNOSIS — Z1159 Encounter for screening for other viral diseases: Secondary | ICD-10-CM | POA: Diagnosis not present

## 2018-10-14 DIAGNOSIS — E1165 Type 2 diabetes mellitus with hyperglycemia: Secondary | ICD-10-CM | POA: Diagnosis not present

## 2018-10-17 DIAGNOSIS — Z1211 Encounter for screening for malignant neoplasm of colon: Secondary | ICD-10-CM | POA: Diagnosis not present

## 2019-07-10 DIAGNOSIS — D735 Infarction of spleen: Secondary | ICD-10-CM

## 2019-07-10 HISTORY — PX: STENT PLACEMENT VASCULAR (ARMC HX): HXRAD1737

## 2019-07-10 HISTORY — DX: Infarction of spleen: D73.5

## 2019-08-01 ENCOUNTER — Inpatient Hospital Stay
Admission: EM | Admit: 2019-08-01 | Discharge: 2019-08-07 | DRG: 254 | Disposition: A | Discharge status: Home / Self Care | Payer: Medicare HMO | Attending: Internal Medicine | Admitting: Internal Medicine

## 2019-08-01 ENCOUNTER — Emergency Department: Payer: Medicare HMO

## 2019-08-01 ENCOUNTER — Other Ambulatory Visit: Payer: Self-pay

## 2019-08-01 DIAGNOSIS — J449 Chronic obstructive pulmonary disease, unspecified: Secondary | ICD-10-CM | POA: Diagnosis present

## 2019-08-01 DIAGNOSIS — E1165 Type 2 diabetes mellitus with hyperglycemia: Secondary | ICD-10-CM | POA: Diagnosis present

## 2019-08-01 DIAGNOSIS — F17210 Nicotine dependence, cigarettes, uncomplicated: Secondary | ICD-10-CM | POA: Diagnosis not present

## 2019-08-01 DIAGNOSIS — Z9114 Patient's other noncompliance with medication regimen: Secondary | ICD-10-CM

## 2019-08-01 DIAGNOSIS — D735 Infarction of spleen: Secondary | ICD-10-CM | POA: Diagnosis not present

## 2019-08-01 DIAGNOSIS — Z833 Family history of diabetes mellitus: Secondary | ICD-10-CM

## 2019-08-01 DIAGNOSIS — Z7984 Long term (current) use of oral hypoglycemic drugs: Secondary | ICD-10-CM | POA: Diagnosis not present

## 2019-08-01 DIAGNOSIS — I998 Other disorder of circulatory system: Secondary | ICD-10-CM

## 2019-08-01 DIAGNOSIS — Z83438 Family history of other disorder of lipoprotein metabolism and other lipidemia: Secondary | ICD-10-CM | POA: Diagnosis not present

## 2019-08-01 DIAGNOSIS — E785 Hyperlipidemia, unspecified: Secondary | ICD-10-CM

## 2019-08-01 DIAGNOSIS — E119 Type 2 diabetes mellitus without complications: Secondary | ICD-10-CM

## 2019-08-01 DIAGNOSIS — I748 Embolism and thrombosis of other arteries: Secondary | ICD-10-CM | POA: Diagnosis not present

## 2019-08-01 DIAGNOSIS — R1012 Left upper quadrant pain: Secondary | ICD-10-CM | POA: Diagnosis present

## 2019-08-01 DIAGNOSIS — Z7952 Long term (current) use of systemic steroids: Secondary | ICD-10-CM

## 2019-08-01 DIAGNOSIS — R000 Tachycardia, unspecified: Secondary | ICD-10-CM | POA: Diagnosis not present

## 2019-08-01 DIAGNOSIS — Z20822 Contact with and (suspected) exposure to covid-19: Secondary | ICD-10-CM | POA: Diagnosis not present

## 2019-08-01 DIAGNOSIS — R109 Unspecified abdominal pain: Secondary | ICD-10-CM | POA: Diagnosis not present

## 2019-08-01 DIAGNOSIS — R911 Solitary pulmonary nodule: Secondary | ICD-10-CM | POA: Diagnosis not present

## 2019-08-01 DIAGNOSIS — Z79899 Other long term (current) drug therapy: Secondary | ICD-10-CM

## 2019-08-01 DIAGNOSIS — J4489 Other specified chronic obstructive pulmonary disease: Secondary | ICD-10-CM | POA: Diagnosis present

## 2019-08-01 DIAGNOSIS — Z720 Tobacco use: Secondary | ICD-10-CM | POA: Diagnosis present

## 2019-08-01 DIAGNOSIS — Z03818 Encounter for observation for suspected exposure to other biological agents ruled out: Secondary | ICD-10-CM | POA: Diagnosis not present

## 2019-08-01 HISTORY — DX: Chronic obstructive pulmonary disease, unspecified: J44.9

## 2019-08-01 HISTORY — DX: Tobacco use: Z72.0

## 2019-08-01 LAB — CBC
HCT: 45.9 % (ref 39.0–52.0)
Hemoglobin: 15.5 g/dL (ref 13.0–17.0)
MCH: 30 pg (ref 26.0–34.0)
MCHC: 33.8 g/dL (ref 30.0–36.0)
MCV: 89 fL (ref 80.0–100.0)
Platelets: 383 10*3/uL (ref 150–400)
RBC: 5.16 MIL/uL (ref 4.22–5.81)
RDW: 12.4 % (ref 11.5–15.5)
WBC: 11.6 10*3/uL — ABNORMAL HIGH (ref 4.0–10.5)
nRBC: 0 % (ref 0.0–0.2)

## 2019-08-01 LAB — COMPREHENSIVE METABOLIC PANEL
ALT: 18 U/L (ref 0–44)
AST: 17 U/L (ref 15–41)
Albumin: 3.9 g/dL (ref 3.5–5.0)
Alkaline Phosphatase: 92 U/L (ref 38–126)
Anion gap: 12 (ref 5–15)
BUN: 17 mg/dL (ref 6–20)
CO2: 26 mmol/L (ref 22–32)
Calcium: 9.3 mg/dL (ref 8.9–10.3)
Chloride: 95 mmol/L — ABNORMAL LOW (ref 98–111)
Creatinine, Ser: 0.81 mg/dL (ref 0.61–1.24)
GFR calc Af Amer: >60 mL/min (ref >60)
GFR calc non Af Amer: >60 mL/min (ref >60)
Glucose, Bld: 285 mg/dL — ABNORMAL HIGH (ref 70–99)
Potassium: 3.9 mmol/L (ref 3.5–5.1)
Sodium: 133 mmol/L — ABNORMAL LOW (ref 135–145)
Total Bilirubin: 0.7 mg/dL (ref 0.3–1.2)
Total Protein: 8.2 g/dL — ABNORMAL HIGH (ref 6.5–8.1)

## 2019-08-01 LAB — ANTITHROMBIN III: AntiThromb III Func: 123 % — ABNORMAL HIGH (ref 75–120)

## 2019-08-01 LAB — URINALYSIS, COMPLETE (UACMP) WITH MICROSCOPIC
Bacteria, UA: NONE SEEN
Bilirubin Urine: NEGATIVE
Glucose, UA: >=500 mg/dL — AB
Hgb urine dipstick: NEGATIVE
Ketones, ur: 20 mg/dL — AB
Leukocytes,Ua: NEGATIVE
Nitrite: NEGATIVE
Protein, ur: NEGATIVE mg/dL
Specific Gravity, Urine: 1.024 (ref 1.005–1.030)
pH: 6 (ref 5.0–8.0)

## 2019-08-01 LAB — APTT: aPTT: 32 seconds (ref 24–36)

## 2019-08-01 LAB — HEPARIN LEVEL (UNFRACTIONATED)
Heparin Unfractionated: 0.51 IU/mL (ref 0.30–0.70)
Heparin Unfractionated: 0.36 IU/mL (ref 0.30–0.70)

## 2019-08-01 LAB — HEMOGLOBIN A1C
Hgb A1c MFr Bld: 11.3 % — ABNORMAL HIGH (ref 4.8–5.6)
Mean Plasma Glucose: 277.61 mg/dL

## 2019-08-01 LAB — GLUCOSE, CAPILLARY
Glucose-Capillary: 237 mg/dL — ABNORMAL HIGH (ref 70–99)
Glucose-Capillary: 246 mg/dL — ABNORMAL HIGH (ref 70–99)
Glucose-Capillary: 299 mg/dL — ABNORMAL HIGH (ref 70–99)
Glucose-Capillary: 241 mg/dL — ABNORMAL HIGH (ref 70–99)

## 2019-08-01 LAB — HIV ANTIBODY (ROUTINE TESTING W REFLEX): HIV Screen 4th Generation wRfx: NONREACTIVE

## 2019-08-01 LAB — PROTIME-INR
INR: 0.9 (ref 0.8–1.2)
Prothrombin Time: 11.3 seconds — ABNORMAL LOW (ref 11.4–15.2)

## 2019-08-01 LAB — SARS CORONAVIRUS 2 BY RT PCR (HOSPITAL ORDER, PERFORMED IN ~~LOC~~ HOSPITAL LAB): SARS Coronavirus 2: NEGATIVE

## 2019-08-01 LAB — LIPASE, BLOOD: Lipase: 20 U/L (ref 11–51)

## 2019-08-01 MED ORDER — HEPARIN BOLUS VIA INFUSION
4500.0000 [IU] | Freq: Once | INTRAVENOUS | Status: AC
  Administered 2019-08-01: 4500 [IU] via INTRAVENOUS
  Filled 2019-08-01: qty 4500

## 2019-08-01 MED ORDER — ACETAMINOPHEN 325 MG PO TABS: 650.0000 mg | ORAL_TABLET | Freq: Four times a day (QID) | ORAL | Status: DC | PRN

## 2019-08-01 MED ORDER — MORPHINE SULFATE (PF) 2 MG/ML IV SOLN
2.0000 mg | INTRAVENOUS | Status: DC | PRN
  Administered 2019-08-06 (×2): 2 mg via INTRAVENOUS
  Filled 2019-08-01 (×2): qty 1

## 2019-08-01 MED ORDER — IOHEXOL 9 MG/ML PO SOLN
500.0000 mL | Freq: Two times a day (BID) | ORAL | Status: DC | PRN
  Filled 2019-08-01: qty 500

## 2019-08-01 MED ORDER — NICOTINE 21 MG/24HR TD PT24
21.0000 mg | MEDICATED_PATCH | Freq: Every day | TRANSDERMAL | Status: DC
  Administered 2019-08-01 – 2019-08-07 (×7): 21 mg via TRANSDERMAL
  Filled 2019-08-01 (×7): qty 1

## 2019-08-01 MED ORDER — SODIUM CHLORIDE 0.9 % IV SOLN: INTRAVENOUS | Status: DC

## 2019-08-01 MED ORDER — ATORVASTATIN CALCIUM 20 MG PO TABS
40.0000 mg | ORAL_TABLET | Freq: Every day | ORAL | Status: DC
  Administered 2019-08-03: 40 mg via ORAL
  Filled 2019-08-01 (×6): qty 2

## 2019-08-01 MED ORDER — HEPARIN (PORCINE) 25000 UT/250ML-% IV SOLN
1550.0000 [IU]/h | INTRAVENOUS | Status: DC
  Administered 2019-08-01 (×2): 1300 [IU]/h via INTRAVENOUS
  Administered 2019-08-02 – 2019-08-03 (×2): 1450 [IU]/h via INTRAVENOUS
  Administered 2019-08-04: 1550 [IU]/h via INTRAVENOUS
  Administered 2019-08-04: 1450 [IU]/h via INTRAVENOUS
  Administered 2019-08-05 – 2019-08-06 (×2): 1550 [IU]/h via INTRAVENOUS
  Filled 2019-08-01 (×7): qty 250

## 2019-08-01 MED ORDER — IOHEXOL 300 MG/ML  SOLN
100.0000 mL | Freq: Once | INTRAMUSCULAR | Status: AC | PRN
  Administered 2019-08-01: 100 mL via INTRAVENOUS

## 2019-08-01 MED ORDER — OXYCODONE-ACETAMINOPHEN 5-325 MG PO TABS
1.0000 | ORAL_TABLET | ORAL | Status: DC | PRN
  Administered 2019-08-01 – 2019-08-04 (×5): 1 via ORAL
  Filled 2019-08-01 (×5): qty 1

## 2019-08-01 MED ORDER — SODIUM CHLORIDE 0.9% FLUSH
3.0000 mL | Freq: Once | INTRAVENOUS | Status: AC
  Administered 2019-08-01: 3 mL via INTRAVENOUS

## 2019-08-01 MED ORDER — DM-GUAIFENESIN ER 30-600 MG PO TB12
1.0000 | ORAL_TABLET | Freq: Two times a day (BID) | ORAL | Status: DC
  Administered 2019-08-01 – 2019-08-07 (×12): 1 via ORAL
  Filled 2019-08-01 (×12): qty 1

## 2019-08-01 MED ORDER — SENNOSIDES-DOCUSATE SODIUM 8.6-50 MG PO TABS: 1.0000 | ORAL_TABLET | Freq: Every evening | ORAL | Status: DC | PRN

## 2019-08-01 MED ORDER — ONDANSETRON HCL 4 MG/2ML IJ SOLN
4.0000 mg | Freq: Once | INTRAMUSCULAR | Status: AC
  Administered 2019-08-01: 4 mg via INTRAVENOUS
  Filled 2019-08-01: qty 2

## 2019-08-01 MED ORDER — ALBUTEROL SULFATE (2.5 MG/3ML) 0.083% IN NEBU: 3.0000 mL | INHALATION_SOLUTION | RESPIRATORY_TRACT | Status: DC | PRN

## 2019-08-01 MED ORDER — MORPHINE SULFATE (PF) 4 MG/ML IV SOLN
4.0000 mg | Freq: Once | INTRAVENOUS | Status: AC
  Administered 2019-08-01: 4 mg via INTRAVENOUS
  Filled 2019-08-01: qty 1

## 2019-08-01 MED ORDER — ONDANSETRON HCL 4 MG/2ML IJ SOLN: 4.0000 mg | Freq: Three times a day (TID) | INTRAMUSCULAR | Status: DC | PRN

## 2019-08-01 MED ORDER — INSULIN ASPART 100 UNIT/ML ~~LOC~~ SOLN
0.0000 [IU] | Freq: Three times a day (TID) | SUBCUTANEOUS | Status: DC
  Administered 2019-08-01 (×2): 3 [IU] via SUBCUTANEOUS
  Administered 2019-08-01: 5 [IU] via SUBCUTANEOUS
  Administered 2019-08-02: 3 [IU] via SUBCUTANEOUS
  Administered 2019-08-02: 7 [IU] via SUBCUTANEOUS
  Administered 2019-08-02 – 2019-08-03 (×2): 2 [IU] via SUBCUTANEOUS
  Administered 2019-08-03: 5 [IU] via SUBCUTANEOUS
  Administered 2019-08-03: 1 [IU] via SUBCUTANEOUS
  Administered 2019-08-04: 5 [IU] via SUBCUTANEOUS
  Administered 2019-08-04: 1 [IU] via SUBCUTANEOUS
  Administered 2019-08-04 – 2019-08-05 (×3): 2 [IU] via SUBCUTANEOUS
  Administered 2019-08-05: 1 [IU] via SUBCUTANEOUS
  Administered 2019-08-06: 2 [IU] via SUBCUTANEOUS
  Administered 2019-08-07: 1 [IU] via SUBCUTANEOUS
  Administered 2019-08-07: 3 [IU] via SUBCUTANEOUS
  Filled 2019-08-01 (×17): qty 1

## 2019-08-01 NOTE — H&P (Signed)
History and Physical    Benjamin Conley [id number] DOB: 04-15-1965 DOA: 08/01/2019  Referring MD/NP/PA:   PCP: Benjamin Sanes, MD   Patient coming from:  The patient is coming from home.  At baseline, pt is independent for most of ADL.        Chief Complaint: Abdominal pain  HPI: Benjamin Conley is a 54 y.o. male with medical history significant of hyperlipidemia, diabetes mellitus, tobacco abuse, COPD, who presents with abdominal pain.  Patient states that he has been having abdominal pain for more than 4 days.  The abdominal pain is located in the left upper abdomen, constant, sharp, 7 out of 10 severity, nonradiating.  No nausea, vomiting, diarrhea.  No fever or chills.  Patient states that he has a chronic mild dry cough due to COPD, which has not changed.  No chest pain, shortness of breath.  No symptoms of UTI or unilateral weakness.  Patient does not have rectal bleeding.  No recent fall or any injury.  ED Course: pt was found to have WBC 11.6, INR 0.9, PTT 32, lipase of 20, negative COVID-19 PCR, negative urinalysis, electrolytes renal function okay, temperature normal, blood pressure 145/94, tachycardia, oxygen saturation 98% on room air.  CT abdomen/pelvis that showed developing splenic infarcts, likely secondary to splenic artery occlusion, and splenic vein is patent. Pt is admitted to [place] bed as inpatient by accepting MD.  ED physician discussed with vascular surgeon, Dr. Teola Bradley.  Review of Systems:   General: no fevers, chills, no body weight gain, has fatigue HEENT: no blurry vision, hearing changes or sore throat Respiratory: no dyspnea, has coughing, no wheezing CV: no chest pain, no palpitations GI: no nausea, vomiting, has abdominal pain, no diarrhea, constipation GU: no dysuria, burning on urination, increased urinary frequency, hematuria  Ext: no leg edema Neuro: no unilateral weakness, numbness, or tingling, no vision change or hearing loss Skin: no rash,  no skin tear. MSK: No muscle spasm, no deformity, no limitation of range of movement in spin Heme: No easy bruising.  Travel history: No recent long distant travel.  Allergy:  Allergies  Allergen Reactions  . Wasp Venom Anaphylaxis  . Onion     Past Medical History:  Diagnosis Date  . COPD (chronic obstructive pulmonary disease) ([place])   . Diabetes mellitus without complication ([place])   . Hyperlipemia   . Tobacco abuse     Past Surgical History:  Procedure Laterality Date  . BACK SURGERY     lumbar    Social History:  reports that he has been smoking cigarettes. He has been smoking about 1.00 pack per day. He has never used smokeless tobacco. He reports that he does not drink alcohol or use drugs.  Family History:  Family History  Problem Relation Age of Onset  . Diabetes Mellitus II Mother   . Diabetes Mellitus II Brother   . Hyperlipidemia Brother      Prior to Admission medications   Medication Sig Start Date End Date Taking? Authorizing Provider  atorvastatin (LIPITOR) 40 MG tablet Take 40 mg by mouth daily.    [provider]  diphenhydrAMINE (BENADRYL) 25 MG tablet Take 1 tablet (25 mg total) by mouth every 6 (six) hours as needed. 11/02/14   Carrie Mew, MD  EPINEPHrine 0.3 mg/0.3 mL IJ SOAJ injection Inject 0.3 mLs (0.3 mg total) into the muscle once. Follow package instructions as needed for severe allergy or anaphylactic reaction. Patient not taking: Reported on 08/01/2019 11/02/14  Carrie Mew, MD  metFORMIN (GLUCOPHAGE) 500 MG tablet Take by mouth 2 (two) times daily with a meal.    [provider]  predniSONE (DELTASONE) 20 MG tablet Take 2 tablets (40 mg total) by mouth daily. 11/02/14   Carrie Mew, MD    Physical Exam: Vitals:   08/01/19 1100 08/01/19 1200 08/01/19 1230 08/01/19 1300  BP: 107/70     Pulse: (!) 101 95 91 93  Resp: 16 15 17 15   Temp:      TempSrc:      SpO2: 94% 93% 95% 95%  Weight:      Height:         General: Not in acute distress HEENT:       Eyes: PERRL, EOMI, no scleral icterus.       ENT: No discharge from the ears and nose, no pharynx injection, no tonsillar enlargement.        Neck: No JVD, no bruit, no mass felt. Heme: No neck lymph node enlargement. Cardiac: S1/S2, RRR, No murmurs, No gallops or rubs. Respiratory:  No rales, wheezing, rhonchi or rubs. GI: Soft, nondistended, has tenderness in LUQ, no rebound pain, no organomegaly, BS present. GU: No hematuria Ext: No pitting leg edema bilaterally. 2+DP/PT pulse bilaterally. Musculoskeletal: No joint deformities, No joint redness or warmth, no limitation of ROM in spin. Skin: No rashes.  Neuro: Alert, oriented X3, cranial nerves II-XII grossly intact, moves all extremities normally. Psych: Patient is not psychotic, no suicidal or hemocidal ideation.  Labs on Admission: I have personally reviewed following labs and imaging studies  CBC: Recent Labs  Lab 08/01/19 0137  WBC 11.6*  HGB 15.5  HCT 45.9  MCV 89.0  PLT [id number]   Basic Metabolic Panel: Recent Labs  Lab 08/01/19 0137  NA 133*  K 3.9  CL 95*  CO2 26  GLUCOSE 285*  BUN 17  CREATININE 0.81  CALCIUM 9.3   GFR: Estimated Creatinine Clearance: 101.4 mL/min (by C-G formula based on SCr of 0.81 mg/dL). Liver Function Tests: Recent Labs  Lab 08/01/19 0137  AST 17  ALT 18  ALKPHOS 92  BILITOT 0.7  PROT 8.2*  ALBUMIN 3.9   Recent Labs  Lab 08/01/19 0137  LIPASE 20   No results for input(s): AMMONIA in the last 168 hours. Coagulation Profile: Recent Labs  Lab 08/01/19 0610  INR 0.9   Cardiac Enzymes: No results for input(s): CKTOTAL, CKMB, CKMBINDEX, TROPONINI in the last 168 hours. BNP (last 3 results) No results for input(s): PROBNP in the last 8760 hours. HbA1C: Recent Labs    08/01/19 0731  HGBA1C 11.3*   CBG: Recent Labs  Lab 08/01/19 0826 08/01/19 1210  GLUCAP 237* 246*   Lipid Profile: No results for input(s): CHOL, HDL,  LDLCALC, TRIG, CHOLHDL, LDLDIRECT in the last 72 hours. Thyroid Function Tests: No results for input(s): TSH, T4TOTAL, FREET4, T3FREE, THYROIDAB in the last 72 hours. Anemia Panel: No results for input(s): VITAMINB12, FOLATE, FERRITIN, TIBC, IRON, RETICCTPCT in the last 72 hours. Urine analysis:    Component Value Date/Time   COLORURINE YELLOW (A) 08/01/2019 0137   APPEARANCEUR CLEAR (A) 08/01/2019 0137   LABSPEC 1.024 08/01/2019 0137   PHURINE 6.0 08/01/2019 0137   GLUCOSEU >=500 (A) 08/01/2019 0137   HGBUR NEGATIVE 08/01/2019 0137   BILIRUBINUR NEGATIVE 08/01/2019 0137   KETONESUR 20 (A) 08/01/2019 0137   PROTEINUR NEGATIVE 08/01/2019 0137   NITRITE NEGATIVE 08/01/2019 0137   LEUKOCYTESUR NEGATIVE 08/01/2019 0137   Sepsis  Labs: @LABRCNTIP (procalcitonin:4,lacticidven:4) ) Recent Results (from the past 240 hour(s))  SARS Coronavirus 2 by RT PCR (hospital order, performed in [facility] hospital lab) Nasopharyngeal Nasopharyngeal Swab     Status: None   Collection Time: 08/01/19  6:10 AM   Specimen: Nasopharyngeal Swab  Result Value Ref Range Status   SARS Coronavirus 2 NEGATIVE NEGATIVE Final    Comment: (NOTE) SARS-CoV-2 target nucleic acids are NOT DETECTED. The SARS-CoV-2 RNA is generally detectable in upper and lower respiratory specimens during the acute phase of infection. The lowest concentration of SARS-CoV-2 viral copies this assay can detect is 250 copies / mL. A negative result does not preclude SARS-CoV-2 infection and should not be used as the sole basis for treatment or other patient management decisions.  A negative result may occur with improper specimen collection / handling, submission of specimen other than nasopharyngeal swab, presence of viral mutation(s) within the areas targeted by this assay, and inadequate number of viral copies (<250 copies / mL). A negative result must be combined with clinical observations, patient history, and epidemiological  information. Fact Sheet for Patients:   StrictlyIdeas.no Fact Sheet for Healthcare Providers: BankingDealers.co.za This test is not yet approved or cleared  by the [place] FDA and has been authorized for detection and/or diagnosis of SARS-CoV-2 by FDA under an Emergency Use Authorization (EUA).  This EUA will remain in effect (meaning this test can be used) for the duration of the COVID-19 declaration under Section 564(b)(1) of the Act, 21 U.S.C. section 360bbb-3(b)(1), unless the authorization is terminated or revoked sooner. Performed at [facility], [address] [place], [place] [address]      Radiological Exams on Admission: CT Abdomen Pelvis W Contrast  Result Date: 08/01/2019 CLINICAL DATA:  Left upper quadrant abdominal pain x4 days EXAM: CT ABDOMEN AND PELVIS WITH CONTRAST TECHNIQUE: Multidetector CT imaging of the abdomen and pelvis was performed using the standard protocol following bolus administration of intravenous contrast. CONTRAST:  13mL OMNIPAQUE IOHEXOL 300 MG/ML  SOLN COMPARISON:  None. FINDINGS: Lower chest: Lung bases are clear. Hepatobiliary: Liver is within normal limits. Gallbladder is unremarkable. No intrahepatic or extrahepatic ductal dilatation. Pancreas: Within normal limits. Spleen: Heterogeneous hypoperfusion of the spleen, which persists on delayed imaging (series 7/image 1), suggesting developing splenic infarcts. Splenic vein is patent. However, there is splenic artery occlusion. Adrenals/Urinary Tract: Within normal limits. Kidneys are within normal limits.  No hydronephrosis. Bladder is mildly thick-walled although underdistended. Stomach/Bowel: Stomach is notable for a tiny hiatal hernia. No evidence of bowel obstruction. Appendix is not discretely visualized. Vascular/Lymphatic: No evidence of abdominal aortic aneurysm. Mild atherosclerotic calcifications the bilateral common iliac arteries.  Splenic artery occlusion, as described above. Reproductive: Prostate is unremarkable. Other: No abdominopelvic ascites. Musculoskeletal: Status post PLIF at L4-5. Mild degenerative changes of the visualized thoracolumbar spine. IMPRESSION: Developing splenic infarcts, likely secondary to splenic artery occlusion. Splenic vein is patent. These results were called by telephone at the time of interpretation on 08/01/2019 at 5:40 am to provider Dr Alfred Levins, who verbally acknowledged these results. Electronically Signed   By: Julian Hy M.D.   On: 08/01/2019 05:40     EKG: Independently reviewed.  Sinus rhythm, QTC 430, tachycardia, no ischemic change.  Assessment/Plan Principal Problem:   Splenic infarct Active Problems:   Diabetes mellitus without complication (HCC)   Hyperlipemia   COPD with chronic bronchitis (HCC)   Tobacco abuse   Splenic infarct: EDP discussed with vascular surgeon, Dr. Teola Bradley, "who recommended putting patient  on heparin and admitted to the hospitalist for close monitoring for possibility of splenic rupture and for evaluation of possible etiology of splenic artery occlusion".  -Admitted to MedSurg bed as inpatient -IV heparin started -As needed Percocet and morphine for pain -Check hypercoagulable panel  Diabetes mellitus without complication ([place]): Most recent A1c 6.8, well controled. Patient is taking Metformin at home -SSI  Hyperlipemia -lipitor  COPD with chronic bronchitis ([place]): stable -prn albuterol and Mucinex  Tobacco abuse: -Did counseling about importance of quitting smoking -Nicotine patch    DVT ppx: on IV Heparin   Code Status: Full code Family Communication:   Yes, patient's brother by phone Disposition Plan:  Anticipate discharge back to previous home environment Consults called: ED physician discussed with vascular surgeon, Dr. Teola Bradley Admission status: Med-surg bed  as inpt       Status is: Inpatient  Remains inpatient  appropriate because:Inpatient level of care appropriate due to severity of illness.  Patient has multiple comorbidities, now presents with splenic infarction.  His presentation is highly complicated.  Patient is at high risk of deteriorating, such as developing splenic rupture.  Patient will need to be monitored and treated in hospital for at least 2 days. Dispo: The patient is from: Home              Anticipated d/c is to: Home              Anticipated d/c date is: 2 days              Patient currently is not medically stable to d/c.           Date of Service 08/01/2019    Ivor Costa [facility]   If 7PM-7AM, please contact night-coverage [email or web address] 08/01/2019, 1:28 PM

## 2019-08-01 NOTE — TOC Initial Note (Signed)
Transition of Care [facility]) - Initial/Assessment Note    Patient Details  Name: Benjamin Conley MRN: [id number] Date of Birth: [birth year]  Transition of [facility]) CM/SW Contact:    Meriel Flavors, LCSW Phone Number: 08/01/2019, 4:45 PM  Clinical Narrative:                 Patient came to ER complaining of upper abdominal pain, admitted for possible Splenic infarct No TOC needs identified at this time. Patient will likely d/c back home/self        Patient Goals and CMS Choice        Expected Discharge Plan and Services                                                Prior Living Arrangements/Services                       Activities of Daily Living Home Assistive Devices/Equipment: None, Blood pressure cuff ADL Screening (condition at time of admission) Patient's cognitive ability adequate to safely complete daily activities?: Yes Is the patient deaf or have difficulty hearing?: No Does the patient have difficulty seeing, even when wearing glasses/contacts?: No Does the patient have difficulty concentrating, remembering, or making decisions?: No Patient able to express need for assistance with ADLs?: Yes Does the patient have difficulty dressing or bathing?: No Independently performs ADLs?: Yes (appropriate for developmental age) Does the patient have difficulty walking or climbing stairs?: Yes Weakness of Legs: Both Weakness of Arms/Hands: None  Permission Sought/Granted                  Emotional Assessment              Admission diagnosis:  Splenic infarction [D73.5] Splenic infarct [D73.5] 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  . COPD with chronic bronchitis ([place]) 08/01/2019  . Tobacco abuse 08/01/2019  . Diabetes mellitus without complication ([place])   . Hyperlipemia    PCP:  Charlotte Sanes, MD Pharmacy:   [facility] [place], [place] - [address] [place] [address] Phone: [phone] Fax: [phone]  [facility] [address] [place] - [place] [address] Phone: [phone] Fax: [phone]     Social Determinants of Health (SDOH) Interventions    Readmission Risk Interventions No flowsheet data found.

## 2019-08-01 NOTE — Consult Note (Signed)
ANTICOAGULATION CONSULT NOTE - Initial Consult  Pharmacy Consult for Heparin Drip Indication: Splenic Artery Occlusion  Allergies  Allergen Reactions  . Wasp Venom Anaphylaxis  . Coffee Flavor Nausea And Vomiting    Patient states coffee gives him nausea and stomach cramps  . Onion     Patient Measurements: Height: 5\' 4"  (162.6 cm) Weight: 81.2 kg (179 lb) IBW/kg (Calculated) : 59.2 Heparin Dosing Weight: 76.2 kg  Vital Signs: Temp: 98.4 F (36.9 C) (05/23 2114) Temp Source: Oral (05/23 2114) BP: 138/96 (05/23 2114) Pulse Rate: 105 (05/23 2114)  Labs: Recent Labs    08/01/19 0137 08/01/19 0610 08/01/19 1352 08/01/19 2041  HGB 15.5  --   --   --   HCT 45.9  --   --   --   PLT 383  --   --   --   APTT  --  32  --   --   LABPROT  --  11.3*  --   --   INR  --  0.9  --   --   HEPARINUNFRC  --   --  0.51 0.36  CREATININE 0.81  --   --   --     Estimated Creatinine Clearance: 101.4 mL/min (by C-G formula based on SCr of 0.81 mg/dL).   Medical History: Past Medical History:  Diagnosis Date  . COPD (chronic obstructive pulmonary disease) ([place])   . Diabetes mellitus without complication ([place])   . Hyperlipemia   . Tobacco abuse     Medications:  Medications Prior to Admission  Medication Sig Dispense Refill Last Dose  . atorvastatin (LIPITOR) 40 MG tablet Take 40 mg by mouth daily.   Not Taking at Unknown time  . diphenhydrAMINE (BENADRYL) 25 MG tablet Take 1 tablet (25 mg total) by mouth every 6 (six) hours as needed. 30 tablet 0   . EPINEPHrine 0.3 mg/0.3 mL IJ SOAJ injection Inject 0.3 mLs (0.3 mg total) into the muscle once. Follow package instructions as needed for severe allergy or anaphylactic reaction. (Patient not taking: Reported on 08/01/2019) 1 Device 2 Not Taking at Unknown time  . metFORMIN (GLUCOPHAGE) 500 MG tablet Take by mouth 2 (two) times daily with a meal.   Not Taking at Unknown time  . predniSONE (DELTASONE) 20 MG tablet Take 2 tablets (40 mg  total) by mouth daily. 8 tablet 0    Scheduled:  . atorvastatin  40 mg Oral Daily  . dextromethorphan-guaiFENesin  1 tablet Oral BID  . insulin aspart  0-9 Units Subcutaneous TID WC  . nicotine  21 mg Transdermal Daily   Infusions:  . heparin 1,300 Units/hr (08/01/19 2204)   PRN: acetaminophen, albuterol, iohexol, morphine injection, ondansetron (ZOFRAN) IV, oxyCODONE-acetaminophen, senna-docusate Anti-infectives (From admission, onward)   None      Assessment: Pharmacy has been consulted to initiate heparin infusion in 53yo patient complaining of left upper quadrant abdominal pain x 4 days. CT of abdomen shows developing splenic infarcts, likely secondary to splenic artery occlusion. Splenic vein is patent. Patient with no history of PTA anticoagulant use. Baseline labs: Hgb 15.5, Plts 383, aPTT/INR pending. Will initiate heparin drip immediately.  Given 4500 units bolus and heparin infusion started at 1300 units/hr  0523 1352 HL 0.51   Goal of Therapy:  Heparin level 0.3-0.7 units/ml Monitor platelets by anticoagulation protocol: Yes   Plan:  0523 2041: HL 0.36 therapeutic x 2 - Continue current rate of 1300 units/hr.  Check anti-Xa level with AM labs. Continue  to monitor H&H and platelets  Pearla Dubonnet, PharmD Clinical Pharmacist 08/01/2019 10:05 PM

## 2019-08-01 NOTE — Consult Note (Signed)
ANTICOAGULATION CONSULT NOTE - Initial Consult  Pharmacy Consult for Heparin Drip Indication: Splenic Artery Occlusion  Allergies  Allergen Reactions  . Wasp Venom Anaphylaxis  . Onion     Conley Measurements: Height: 5\' 4"  (162.6 cm) Weight: 81.2 kg (179 lb) IBW/kg (Calculated) : 59.2 Heparin Dosing Weight: 76.2 kg  Vital Signs: BP: 136/100 (05/23 1430) Pulse Rate: 101 (05/23 1430)  Labs: Recent Labs    08/01/19 0137 08/01/19 0610 08/01/19 1352  HGB 15.5  --   --   HCT 45.9  --   --   PLT 383  --   --   APTT  --  32  --   LABPROT  --  11.3*  --   INR  --  0.9  --   HEPARINUNFRC  --   --  0.51  CREATININE 0.81  --   --     Estimated Creatinine Clearance: 101.4 mL/min (by C-G formula based on SCr of 0.81 mg/dL).   Medical History: Past Medical History:  Diagnosis Date  . COPD (chronic obstructive pulmonary disease) ([place])   . Diabetes mellitus without complication ([place])   . Hyperlipemia   . Tobacco abuse     Medications:  (Not in a hospital admission)  Scheduled:  . dextromethorphan-guaiFENesin  1 tablet Oral BID  . insulin aspart  0-9 Units Subcutaneous TID WC  . nicotine  21 mg Transdermal Daily   Infusions:  . heparin 1,300 Units/hr (08/01/19 0745)   PRN: acetaminophen, albuterol, iohexol, morphine injection, ondansetron (ZOFRAN) IV, oxyCODONE-acetaminophen, senna-docusate Anti-infectives (From admission, onward)   None      Assessment: Pharmacy has been consulted to initiate heparin infusion in Benjamin Conley complaining of left upper quadrant abdominal pain x 4 days. CT of abdomen shows developing splenic infarcts, likely secondary to splenic artery occlusion. Splenic vein is patent. Conley with no history of PTA anticoagulant use. Baseline labs: Hgb 15.5, Plts 383, aPTT/INR pending. Will initiate heparin drip immediately.  Given 4500 units bolus and heparin infusion started at 1300 units/hr  0523 1352 HL 0.51  Goal of Therapy:  Heparin  level 0.3-0.7 units/ml Monitor platelets by anticoagulation protocol: Yes   Plan:  HL 0.51 therapeutic x 1 - Continue current rate of 1300 units/hr.  Check anti-Xa level in 6 hours and daily while on heparin Continue to monitor H&H and platelets  Lu Duffel, PharmD, BCPS Clinical Pharmacist 08/01/2019 2:32 PM

## 2019-08-01 NOTE — ED Notes (Signed)
Pt sitting on side of bed eating lunch.

## 2019-08-01 NOTE — ED Provider Notes (Signed)
[facility] Emergency Department Provider Note  ____________________________________________  Time seen: Approximately 5:56 AM  I have reviewed the triage vital signs and the nursing notes.   HISTORY  Chief Complaint Abdominal Pain   HPI Benjamin Conley is a 54 y.o. male with a history of diabetes and hyperlipidemia who presents for evaluation of abdominal pain.   Patient is complaining of sharp severe constant left upper quadrant abdominal pain for 4 days.  Normal appetite, no nausea, vomiting, diarrhea, trauma, fever, dysuria or hematuria.  Normal bowel movements.  No prior abdominal surgeries.  No chest pain or shortness of breath.  No recent Covid vaccinations or exposures.  Past Medical History:  Diagnosis Date  . Diabetes mellitus without complication ([place])   . Hyperlipemia     There are no problems to display for this patient.   Past Surgical History:  Procedure Laterality Date  . BACK SURGERY     lumbar    Prior to Admission medications   Medication Sig Start Date End Date Taking? Authorizing Provider  atorvastatin (LIPITOR) 40 MG tablet Take 40 mg by mouth daily.    [provider]  diphenhydrAMINE (BENADRYL) 25 MG tablet Take 1 tablet (25 mg total) by mouth every 6 (six) hours as needed. 11/02/14   Carrie Mew, MD  EPINEPHrine 0.3 mg/0.3 mL IJ SOAJ injection Inject 0.3 mLs (0.3 mg total) into the muscle once. Follow package instructions as needed for severe allergy or anaphylactic reaction. 11/02/14   Carrie Mew, MD  metFORMIN (GLUCOPHAGE) 500 MG tablet Take by mouth 2 (two) times daily with a meal.    [provider]  predniSONE (DELTASONE) 20 MG tablet Take 2 tablets (40 mg total) by mouth daily. 11/02/14   Carrie Mew, MD    Allergies Wasp venom and Onion  History reviewed. No pertinent family history.  Social History Social History   Tobacco Use  . Smoking status: Current Every Day Smoker      Packs/day: 1.00    Types: Cigarettes  . Smokeless tobacco: Never Used  Substance Use Topics  . Alcohol use: No  . Drug use: No    Review of Systems  Constitutional: Negative for fever. Eyes: Negative for visual changes. ENT: Negative for sore throat. Neck: No neck pain  Cardiovascular: Negative for chest pain. Respiratory: Negative for shortness of breath. Gastrointestinal: +LUQ abdominal pain. No vomiting or diarrhea. Genitourinary: Negative for dysuria. Musculoskeletal: Negative for back pain. Skin: Negative for rash. Neurological: Negative for headaches, weakness or numbness. Psych: No SI or HI  ____________________________________________   PHYSICAL EXAM:  VITAL SIGNS: ED Triage Vitals  Enc Vitals Group     BP 08/01/19 0135 (!) 143/94     Pulse Rate 08/01/19 0135 (!) 104     Resp 08/01/19 0539 18     Temp 08/01/19 0135 98.3 F (36.8 C)     Temp Source 08/01/19 0135 Oral     SpO2 08/01/19 0135 100 %     Weight 08/01/19 0124 179 lb (81.2 kg)     Height 08/01/19 0124 5\' 4"  (1.626 m)     Head Circumference --      Peak Flow --      Pain Score 08/01/19 0124 7     Pain Loc --      Pain Edu? --      Excl. in [place]? --     Constitutional: Alert and oriented. Well appearing and in no apparent distress. HEENT:  Head: Normocephalic and atraumatic.         Eyes: Conjunctivae are normal. Sclera is non-icteric.       Mouth/Throat: Mucous membranes are moist.       Neck: Supple with no signs of meningismus. Cardiovascular: Regular rate and rhythm. No murmurs, gallops, or rubs. 2+ symmetrical distal pulses are present in all extremities. No JVD. Respiratory: Normal respiratory effort. Lungs are clear to auscultation bilaterally. No wheezes, crackles, or rhonchi.  Gastrointestinal: Soft, tender to palpation in the left upper quadrant, and non distended with positive bowel sounds. No rebound or guarding. Genitourinary: No CVA tenderness. Musculoskeletal: No edema,  cyanosis, or erythema of extremities. Neurologic: Normal speech and language. Face is symmetric. Moving all extremities. No gross focal neurologic deficits are appreciated. Skin: Skin is warm, dry and intact. No rash noted. Psychiatric: Mood and affect are normal. Speech and behavior are normal.  ____________________________________________   LABS (all labs ordered are listed, but only abnormal results are displayed)  Labs Reviewed  COMPREHENSIVE METABOLIC PANEL - Abnormal; Notable for the following components:      Result Value   Sodium 133 (*)    Chloride 95 (*)    Glucose, Bld 285 (*)    Total Protein 8.2 (*)    All other components within normal limits  CBC - Abnormal; Notable for the following components:   WBC 11.6 (*)    All other components within normal limits  URINALYSIS, COMPLETE (UACMP) WITH MICROSCOPIC - Abnormal; Notable for the following components:   Color, Urine YELLOW (*)    APPearance CLEAR (*)    Glucose, UA >=500 (*)    Ketones, ur 20 (*)    All other components within normal limits  SARS CORONAVIRUS 2 BY RT PCR (HOSPITAL ORDER, [place] [facility])  LIPASE, BLOOD  APTT  PROTIME-INR   ____________________________________________  EKG  ED ECG REPORT I, Rudene Re, the attending physician, personally viewed and interpreted this ECG.  Sinus tachycardia, rate of 103, normal intervals, normal axis, no ST elevations or depressions.  Otherwise normal EKG. ____________________________________________  RADIOLOGY  I have personally reviewed the images performed during this visit and I agree with the Radiologist's read.   Interpretation by Radiologist:  CT Abdomen Pelvis W Contrast  Result Date: 08/01/2019 CLINICAL DATA:  Left upper quadrant abdominal pain x4 days EXAM: CT ABDOMEN AND PELVIS WITH CONTRAST TECHNIQUE: Multidetector CT imaging of the abdomen and pelvis was performed using the standard protocol following bolus  administration of intravenous contrast. CONTRAST:  155mL OMNIPAQUE IOHEXOL 300 MG/ML  SOLN COMPARISON:  None. FINDINGS: Lower chest: Lung bases are clear. Hepatobiliary: Liver is within normal limits. Gallbladder is unremarkable. No intrahepatic or extrahepatic ductal dilatation. Pancreas: Within normal limits. Spleen: Heterogeneous hypoperfusion of the spleen, which persists on delayed imaging (series 7/image 1), suggesting developing splenic infarcts. Splenic vein is patent. However, there is splenic artery occlusion. Adrenals/Urinary Tract: Within normal limits. Kidneys are within normal limits.  No hydronephrosis. Bladder is mildly thick-walled although underdistended. Stomach/Bowel: Stomach is notable for a tiny hiatal hernia. No evidence of bowel obstruction. Appendix is not discretely visualized. Vascular/Lymphatic: No evidence of abdominal aortic aneurysm. Mild atherosclerotic calcifications the bilateral common iliac arteries. Splenic artery occlusion, as described above. Reproductive: Prostate is unremarkable. Other: No abdominopelvic ascites. Musculoskeletal: Status post PLIF at L4-5. Mild degenerative changes of the visualized thoracolumbar spine. IMPRESSION: Developing splenic infarcts, likely secondary to splenic artery occlusion. Splenic vein is patent. These results were called by telephone at  the time of interpretation on 08/01/2019 at 5:40 am to provider Dr Alfred Levins, who verbally acknowledged these results. Electronically Signed   By: Julian Hy M.D.   On: 08/01/2019 05:40     ____________________________________________   PROCEDURES  Procedure(s) performed:yes .1-3 Lead EKG Interpretation Performed by: Rudene Re, MD Authorized by: Rudene Re, MD     Interpretation: normal     ECG rate assessment: normal     Rhythm: sinus rhythm     Ectopy: none     Critical Care performed: yes  CRITICAL CARE Performed by: Rudene Re  ?  Total critical care  time: 35 min  Critical care time was exclusive of separately billable procedures and treating other patients.  Critical care was necessary to treat or prevent imminent or life-threatening deterioration.  Critical care was time spent personally by me on the following activities: development of treatment plan with patient and/or surrogate as well as nursing, discussions with consultants, evaluation of patient's response to treatment, examination of patient, obtaining history from patient or surrogate, ordering and performing treatments and interventions, ordering and review of laboratory studies, ordering and review of radiographic studies, pulse oximetry and re-evaluation of patient's condition.  ____________________________________________   INITIAL IMPRESSION / ASSESSMENT AND PLAN / ED COURSE  54 y.o. male with a history of diabetes and hyperlipidemia who presents for evaluation of LUQ abdominal pain x 4 days.  Patient is well-appearing with normal vital signs, abdomen is soft with left upper quadrant tenderness but no rebound or guarding.  Review of medical records done.  Labs showing hyperglycemia no evidence of DKA.  Patient is noncompliant with his diabetic medications.  Mild leukocytosis with white count of 11.6.  UA negative for UTI.  CT abdomen pelvis showing splenic infarct which is confirmed by radiology most likely due to splenic artery occlusion.  Patient has no history of coagulopathy, no trauma, no history of sickle cell, no Covid vaccinations, no Covid infection.  Discussed with Dr. Teola Bradley from vascular surgery who recommended putting patient on heparin and admitted to the hospitalist for close monitoring for possibility of splenic rupture and for evaluation of possible etiology of splenic artery occlusion.  Patient denies any personal or family history of malignancy.  Will give IV morphine and Zofran for symptom relief.      _____________________________________________ Please note:   Patient was evaluated in Emergency Department today for the symptoms described in the history of present illness. Patient was evaluated in the context of the global COVID-19 pandemic, which necessitated consideration that the patient might be at risk for infection with the SARS-CoV-2 virus that causes COVID-19. Institutional protocols and algorithms that pertain to the evaluation of patients at risk for COVID-19 are in a state of rapid change based on information released by regulatory bodies including the CDC and federal and state organizations. These policies and algorithms were followed during the patient's care in the ED.  Some ED evaluations and interventions may be delayed as a result of limited staffing during the pandemic.   [place] Controlled Substance Database was reviewed by me. ____________________________________________   FINAL CLINICAL IMPRESSION(S) / ED DIAGNOSES   Final diagnoses:  Thrombosis of splenic artery (HCC)  Splenic infarct      NEW MEDICATIONS STARTED DURING THIS VISIT:  ED Discharge Orders    None       Note:  This document was prepared using Dragon voice recognition software and may include unintentional dictation errors.    Alfred Levins, [place], MD 08/01/19 [phone]

## 2019-08-01 NOTE — ED Triage Notes (Signed)
Four days of left upper quadrant abdominal pain. Patient denies N/V/D. States appetite is okay. Last NBM was two days ago; small amount

## 2019-08-01 NOTE — Consult Note (Signed)
ANTICOAGULATION CONSULT NOTE - Initial Consult  Pharmacy Consult for Heparin Drip Indication: Splenic Artery Occlusion  Allergies  Allergen Reactions  . Wasp Venom Anaphylaxis  . Onion     Patient Measurements: Height: 5\' 4"  (162.6 cm) Weight: 81.2 kg (179 lb) IBW/kg (Calculated) : 59.2 Heparin Dosing Weight: 76.2 kg  Vital Signs: Temp: 98.3 F (36.8 C) (05/23 0135) Temp Source: Oral (05/23 0135) BP: 145/94 (05/23 0539) Pulse Rate: 97 (05/23 0539)  Labs: Recent Labs    08/01/19 0137  HGB 15.5  HCT 45.9  PLT 383  CREATININE 0.81    Estimated Creatinine Clearance: 101.4 mL/min (by C-G formula based on SCr of 0.81 mg/dL).   Medical History: Past Medical History:  Diagnosis Date  . Diabetes mellitus without complication ([place])   . Hyperlipemia     Medications:  (Not in a hospital admission)  Scheduled:  . heparin  4,500 Units Intravenous Once  . sodium chloride flush  3 mL Intravenous Once   Infusions:  . heparin     PRN: iohexol Anti-infectives (From admission, onward)   None      Assessment: Pharmacy has been consulted to initiate heparin infusion in 53yo patient complaining of left upper quadrant abdominal pain x 4 days. CT of abdomen shows developing splenic infarcts, likely secondary to splenic artery occlusion. Splenic vein is patent. Patient with no history of PTA anticoagulant use. Baseline labs: Hgb 15.5, Plts 383, aPTT/INR pending. Will initiate heparin drip immediately.  Goal of Therapy:  Heparin level 0.3-0.7 units/ml Monitor platelets by anticoagulation protocol: Yes   Plan:  Give 4500 units bolus x 1 Start heparin infusion at 1300 units/hr Check anti-Xa level in 6 hours and daily while on heparin Continue to monitor H&H and platelets  Trystin Terhune A Roselynn Whitacre 08/01/2019,5:57 AM

## 2019-08-02 LAB — GLUCOSE, CAPILLARY
Glucose-Capillary: 229 mg/dL — ABNORMAL HIGH (ref 70–99)
Glucose-Capillary: 323 mg/dL — ABNORMAL HIGH (ref 70–99)
Glucose-Capillary: 191 mg/dL — ABNORMAL HIGH (ref 70–99)
Glucose-Capillary: 240 mg/dL — ABNORMAL HIGH (ref 70–99)

## 2019-08-02 LAB — BASIC METABOLIC PANEL
Anion gap: 11 (ref 5–15)
BUN: 10 mg/dL (ref 6–20)
CO2: 27 mmol/L (ref 22–32)
Calcium: 9.1 mg/dL (ref 8.9–10.3)
Chloride: 97 mmol/L — ABNORMAL LOW (ref 98–111)
Creatinine, Ser: 0.51 mg/dL — ABNORMAL LOW (ref 0.61–1.24)
GFR calc Af Amer: >60 mL/min (ref >60)
GFR calc non Af Amer: >60 mL/min (ref >60)
Glucose, Bld: 231 mg/dL — ABNORMAL HIGH (ref 70–99)
Potassium: 4.1 mmol/L (ref 3.5–5.1)
Sodium: 135 mmol/L (ref 135–145)

## 2019-08-02 LAB — CBC
HCT: 44.3 % (ref 39.0–52.0)
Hemoglobin: 14.9 g/dL (ref 13.0–17.0)
MCH: 30 pg (ref 26.0–34.0)
MCHC: 33.6 g/dL (ref 30.0–36.0)
MCV: 89.3 fL (ref 80.0–100.0)
Platelets: 377 10*3/uL (ref 150–400)
RBC: 4.96 MIL/uL (ref 4.22–5.81)
RDW: 12.3 % (ref 11.5–15.5)
WBC: 10.4 10*3/uL (ref 4.0–10.5)
nRBC: 0 % (ref 0.0–0.2)

## 2019-08-02 LAB — HEPARIN LEVEL (UNFRACTIONATED)
Heparin Unfractionated: 0.25 IU/mL — ABNORMAL LOW (ref 0.30–0.70)
Heparin Unfractionated: 0.36 IU/mL (ref 0.30–0.70)
Heparin Unfractionated: 0.4 IU/mL (ref 0.30–0.70)

## 2019-08-02 LAB — HOMOCYSTEINE: Homocysteine: 9.6 umol/L (ref 0.0–14.5)

## 2019-08-02 MED ORDER — INSULIN ASPART 100 UNIT/ML ~~LOC~~ SOLN
3.0000 [IU] | Freq: Three times a day (TID) | SUBCUTANEOUS | Status: DC
  Administered 2019-08-02 – 2019-08-06 (×11): 3 [IU] via SUBCUTANEOUS
  Filled 2019-08-02 (×11): qty 1

## 2019-08-02 MED ORDER — INSULIN GLARGINE 100 UNIT/ML ~~LOC~~ SOLN
15.0000 [IU] | Freq: Every day | SUBCUTANEOUS | Status: DC
  Administered 2019-08-02 – 2019-08-06 (×5): 15 [IU] via SUBCUTANEOUS
  Filled 2019-08-02 (×5): qty 0.15

## 2019-08-02 MED ORDER — LIVING WELL WITH DIABETES BOOK
Freq: Once | Status: AC
  Filled 2019-08-02: qty 1

## 2019-08-02 MED ORDER — HEPARIN BOLUS VIA INFUSION
1100.0000 [IU] | Freq: Once | INTRAVENOUS | Status: AC
  Administered 2019-08-02: 1100 [IU] via INTRAVENOUS
  Filled 2019-08-02: qty 1100

## 2019-08-02 NOTE — Consult Note (Signed)
ANTICOAGULATION CONSULT NOTE -  Pharmacy Consult for Heparin Drip Indication: Splenic Artery Occlusion  Allergies  Allergen Reactions  . Wasp Venom Anaphylaxis  . Coffee Flavor Nausea And Vomiting    Patient states coffee gives him nausea and stomach cramps  . Onion     Patient Measurements: Height: 5\' 4"  (162.6 cm) Weight: 81.2 kg (179 lb) IBW/kg (Calculated) : 59.2 Heparin Dosing Weight: 76.2 kg  Vital Signs: Temp: 98.7 F (37.1 C) (05/24 1154) Temp Source: Oral (05/24 1154) BP: 122/86 (05/24 1154) Pulse Rate: 107 (05/24 1154)  Labs: Recent Labs    08/01/19 0137 08/01/19 0610 08/01/19 1352 08/01/19 2041 08/02/19 0556 08/02/19 1352  HGB 15.5  --   --   --  14.9  --   HCT 45.9  --   --   --  44.3  --   PLT 383  --   --   --  377  --   APTT  --  32  --   --   --   --   LABPROT  --  11.3*  --   --   --   --   INR  --  0.9  --   --   --   --   HEPARINUNFRC  --   --    < > 0.36 0.25* 0.36  CREATININE 0.81  --   --   --  0.51*  --    < > = values in this interval not displayed.    Estimated Creatinine Clearance: 102.7 mL/min (A) (by C-G formula based on SCr of 0.51 mg/dL (L)).   Medical History: Past Medical History:  Diagnosis Date  . COPD (chronic obstructive pulmonary disease) ([place])   . Diabetes mellitus without complication ([place])   . Hyperlipemia   . Tobacco abuse     Medications:  Medications Prior to Admission  Medication Sig Dispense Refill Last Dose  . atorvastatin (LIPITOR) 40 MG tablet Take 40 mg by mouth daily.   Not Taking at Unknown time  . diphenhydrAMINE (BENADRYL) 25 MG tablet Take 1 tablet (25 mg total) by mouth every 6 (six) hours as needed. 30 tablet 0   . EPINEPHrine 0.3 mg/0.3 mL IJ SOAJ injection Inject 0.3 mLs (0.3 mg total) into the muscle once. Follow package instructions as needed for severe allergy or anaphylactic reaction. (Patient not taking: Reported on 08/01/2019) 1 Device 2 Not Taking at Unknown time  . metFORMIN (GLUCOPHAGE)  500 MG tablet Take by mouth 2 (two) times daily with a meal.   Not Taking at Unknown time  . predniSONE (DELTASONE) 20 MG tablet Take 2 tablets (40 mg total) by mouth daily. 8 tablet 0    Scheduled:  . atorvastatin  40 mg Oral Daily  . dextromethorphan-guaiFENesin  1 tablet Oral BID  . insulin aspart  0-9 Units Subcutaneous TID WC  . insulin aspart  3 Units Subcutaneous TID WC  . insulin glargine  15 Units Subcutaneous QHS  . nicotine  21 mg Transdermal Daily   Infusions:  . heparin 1,450 Units/hr (08/02/19 0808)   PRN: acetaminophen, albuterol, iohexol, morphine injection, ondansetron (ZOFRAN) IV, oxyCODONE-acetaminophen, senna-docusate Anti-infectives (From admission, onward)   None      Assessment: Pharmacy has been consulted to initiate heparin infusion in 54yo patient complaining of left upper quadrant abdominal pain x 4 days. CT of abdomen shows developing splenic infarcts, likely secondary to splenic artery occlusion. Splenic vein is patent. Patient with no history of PTA anticoagulant  use. Baseline labs: Hgb 15.5, Plts 383, aPTT/INR pending. Will initiate heparin drip immediately.  Given 4500 units bolus and heparin infusion started at 1300 units/hr  0523 1352 HL 0.51 0523 2041: HL 0.36 therapeutic x 2 - Continue current rate of 1300 units/hr. [id number] : HL 0.25 subtherapeutic - Will order bolus of 1100 units x 1 and increase drip rate to 1450 units/hr   Goal of Therapy:  Heparin level 0.3-0.7 units/ml Monitor platelets by anticoagulation protocol: Yes   Plan:  [id number]  : HL 0.36 therapeutic - Will continue drip rate of 1450 units/hr. Will check for confirmatory level in 6 hours  Check anti-Xa level with AM labs. Continue to monitor H&H and platelets  Noralee Space, PharmD Clinical Pharmacist 08/02/2019 3:25 PM

## 2019-08-02 NOTE — Progress Notes (Addendum)
Inpatient Diabetes Program Recommendations  AACE/ADA: New Consensus Statement on Inpatient Glycemic Control (2015)  Target Ranges:  Prepandial:   less than 140 mg/dL      Peak postprandial:   less than 180 mg/dL (1-2 hours)      Critically ill patients:  140 - 180 mg/dL   Lab Results  Component Value Date   GLUCAP 229 (H) 08/02/2019   HGBA1C 11.3 (H) 08/01/2019    Review of Glycemic Control Results for Benjamin Conley, Benjamin Conley (MRN [id number]) as of 08/02/2019 09:49  Ref. Range 08/01/2019 15:40 08/01/2019 21:11 08/02/2019 07:43  Glucose-Capillary Latest Ref Range: 70 - 99 mg/dL 299 (H) 241 (H) 229 (H)   Diabetes history: DM 2 Outpatient Diabetes medications:  Metformin 500 mg bid Current orders for Inpatient glycemic control:  Novolog sensitive tid with meals  Inpatient Diabetes Program Recommendations:    A1C markedly increased indicating average blood sugars=278 mg/dL.   Please consider adding Lantus 16 units daily.  Also consider adding Novolog 3 units tid with meals.  Will discuss A1C with patient.  May need insulin at d/c.   Thanks  Adah Perl, RN, BC-ADM Inpatient Diabetes Coordinator Pager [phone] (8a-5p)  16:30- Spoke to patient.  He states that he stopped taking insulin in 2017 because it made him feel bad and made him have diarrhea.  Explained that insulin usually doesn't cause GI issues and patient confirmed he was also on Metformin.  I wonder if this may have caused his GI upset.  Explained current A1C and the ill effects that high blood sugars have on blood sugars.  Patient states he has not felt bad since being off insulin.  Encouraged patient to take insulin and improve glycemic control.  Explained that MD has started basal insulin and we can see while he's here if it hurts his stomach.  Patient agreeable to this.  Ordered LWWD booklet as well.  May only be willing to do one shot a day at d/c?

## 2019-08-02 NOTE — Consult Note (Signed)
ANTICOAGULATION CONSULT NOTE -  Pharmacy Consult for Heparin Drip Indication: Splenic Artery Occlusion  Allergies  Allergen Reactions  . Wasp Venom Anaphylaxis  . Coffee Flavor Nausea And Vomiting    Patient states coffee gives him nausea and stomach cramps  . Onion     Patient Measurements: Height: 5\' 4"  (162.6 cm) Weight: 81.2 kg (179 lb) IBW/kg (Calculated) : 59.2 Heparin Dosing Weight: 76.2 kg  Vital Signs: Temp: 97.7 F (36.5 C) (05/24 0441) Temp Source: Oral (05/24 0441) BP: 125/97 (05/24 0441) Pulse Rate: 104 (05/24 0441)  Labs: Recent Labs    08/01/19 0137 08/01/19 0610 08/01/19 1352 08/01/19 2041 08/02/19 0556  HGB 15.5  --   --   --  14.9  HCT 45.9  --   --   --  44.3  PLT 383  --   --   --  377  APTT  --  32  --   --   --   LABPROT  --  11.3*  --   --   --   INR  --  0.9  --   --   --   HEPARINUNFRC  --   --  0.51 0.36 0.25*  CREATININE 0.81  --   --   --   --     Estimated Creatinine Clearance: 101.4 mL/min (by C-G formula based on SCr of 0.81 mg/dL).   Medical History: Past Medical History:  Diagnosis Date  . COPD (chronic obstructive pulmonary disease) ([place])   . Diabetes mellitus without complication ([place])   . Hyperlipemia   . Tobacco abuse     Medications:  Medications Prior to Admission  Medication Sig Dispense Refill Last Dose  . atorvastatin (LIPITOR) 40 MG tablet Take 40 mg by mouth daily.   Not Taking at Unknown time  . diphenhydrAMINE (BENADRYL) 25 MG tablet Take 1 tablet (25 mg total) by mouth every 6 (six) hours as needed. 30 tablet 0   . EPINEPHrine 0.3 mg/0.3 mL IJ SOAJ injection Inject 0.3 mLs (0.3 mg total) into the muscle once. Follow package instructions as needed for severe allergy or anaphylactic reaction. (Patient not taking: Reported on 08/01/2019) 1 Device 2 Not Taking at Unknown time  . metFORMIN (GLUCOPHAGE) 500 MG tablet Take by mouth 2 (two) times daily with a meal.   Not Taking at Unknown time  . predniSONE  (DELTASONE) 20 MG tablet Take 2 tablets (40 mg total) by mouth daily. 8 tablet 0    Scheduled:  . atorvastatin  40 mg Oral Daily  . dextromethorphan-guaiFENesin  1 tablet Oral BID  . insulin aspart  0-9 Units Subcutaneous TID WC  . nicotine  21 mg Transdermal Daily   Infusions:  . heparin 1,300 Units/hr (08/02/19 0520)   PRN: acetaminophen, albuterol, iohexol, morphine injection, ondansetron (ZOFRAN) IV, oxyCODONE-acetaminophen, senna-docusate Anti-infectives (From admission, onward)   None      Assessment: Pharmacy has been consulted to initiate heparin infusion in 53yo patient complaining of left upper quadrant abdominal pain x 4 days. CT of abdomen shows developing splenic infarcts, likely secondary to splenic artery occlusion. Splenic vein is patent. Patient with no history of PTA anticoagulant use. Baseline labs: Hgb 15.5, Plts 383, aPTT/INR pending. Will initiate heparin drip immediately.  Given 4500 units bolus and heparin infusion started at 1300 units/hr  0523 1352 HL 0.51 0523 2041: HL 0.36 therapeutic x 2 - Continue current rate of 1300 units/hr.   Goal of Therapy:  Heparin level 0.3-0.7 units/ml Monitor platelets  by anticoagulation protocol: Yes   Plan:  [id number] : HL 0.25 subtherapeutic - Will order bolus of 1100 units x 1 and increase drip rate to 1450 units/hr.  Check anti-Xa level with AM labs. Continue to monitor H&H and platelets  Noralee Space, PharmD Clinical Pharmacist 08/02/2019 7:27 AM

## 2019-08-02 NOTE — Progress Notes (Signed)
PROGRESS NOTE    Benjamin Conley  [id number] DOB: 1965-08-23 DOA: 08/01/2019 PCP: Charlotte Sanes, MD   Brief Narrative:  [name] is a 54 y.o. male with medical history significant of hyperlipidemia, diabetes mellitus, tobacco abuse, COPD, who presents with abdominal pain. CT abdomen/pelvis that showed developing splenic infarcts, likely secondary to splenic artery occlusion, and splenic vein is patent. Pt is admitted to [facility] bed as inpatient by accepting MD.  ED physician discussed with vascular surgeon, Dr. Teola Bradley.  And they advise starting him on heparin infusion.  Subjective: Continues to have left upper quadrant abdominal pain, 3 out of 10 this morning.  No nausea or vomiting.  He was asking for a cigarette.  Per patient he stopped taking his insulin couple of years ago, stating that he wants nature to take his course.  Per patient he does not want to live like a walking medicine cabinet.  Assessment & Plan:   Principal Problem:   Splenic infarct Active Problems:   Diabetes mellitus without complication (HCC)   Hyperlipemia   COPD with chronic bronchitis (HCC)   Tobacco abuse  Splenic infarct: EDP discussed with vascular surgeon, Dr. Teola Bradley, "who recommended putting patient on heparin and admitted to the hospitalist for close monitoring for possibility of splenic rupture and for evaluation of possible etiology of splenic artery occlusion". -Hypercoagulable labs are drawn-pending results. -Continue with heparin infusion. -Continue with pain management. -Continue to monitor as he is high risk for rupture.  Type 2 diabetes.  Prior A1c in the system was done in 2017 and it was 6.8.  Since then he stopped taking his insulin.  Repeat A1c 11.3 with hyperglycemia. Discussed in detail regarding taking care of his diabetes to prevent complications which can drastically decreased his quality of life. -Start him on Lantus 15 units at bedtime. -3 Units of NovoLog with  meals. -Continue with SSI  Hyperlipemia -lipitor  COPD with chronic bronchitis ([place]): stable -prn albuterol and Mucinex  Tobacco abuse: -Did counseling about importance of quitting smoking -Nicotine patch   Objective: Vitals:   08/01/19 2104 08/01/19 2114 08/02/19 0441 08/02/19 1154  BP:  (!) 138/96 (!) 125/97 122/86  Pulse: 87 (!) 105 (!) 104 (!) 107  Resp:  16 16   Temp:  98.4 F (36.9 C) 97.7 F (36.5 C) 98.7 F (37.1 C)  TempSrc:  Oral Oral Oral  SpO2: 92% 97% 96% 95%  Weight:      Height:        Intake/Output Summary (Last 24 hours) at 08/02/2019 1410 Last data filed at 08/02/2019 1000 Gross per 24 hour  Intake 1403.92 ml  Output 425 ml  Net 978.92 ml   Filed Weights   08/01/19 0124  Weight: 81.2 kg    Examination:  General exam: Appears calm and comfortable  Respiratory system: Clear to auscultation. Respiratory effort normal. Cardiovascular system: S1 & S2 heard, RRR. No JVD, murmurs, rubs, gallops or clicks. Gastrointestinal system: Soft, LUQ tenderness, nondistended, bowel sounds positive. Central nervous system: Alert and oriented. No focal neurological deficits.Symmetric 5 x 5 power. Extremities: No edema, no cyanosis, pulses intact and symmetrical. Skin: No rashes, lesions or ulcers Psychiatry: Judgement and insight appear normal. Mood & affect appropriate.    DVT prophylaxis: Heparin Code Status: Full Family Communication: Discussed with patient Disposition Plan:  Status is: Inpatient  Remains inpatient appropriate because:IV treatments appropriate due to intensity of illness or inability to take PO   Dispo: The patient is from: Home  Anticipated d/c is to: Home              Anticipated d/c date is: 2 days              Patient currently is not medically stable to d/c.  Patient is currently on heparin infusion for splenic infarct, pending lab work to find a cause.   Consultants:   Vascular surgery.  Procedures:   Antimicrobials:   Data Reviewed: I have personally reviewed following labs and imaging studies  CBC: Recent Labs  Lab 08/01/19 0137 08/02/19 0556  WBC 11.6* 10.4  HGB 15.5 14.9  HCT 45.9 44.3  MCV 89.0 89.3  PLT 383 [id number]   Basic Metabolic Panel: Recent Labs  Lab 08/01/19 0137 08/02/19 0556  NA 133* 135  K 3.9 4.1  CL 95* 97*  CO2 26 27  GLUCOSE 285* 231*  BUN 17 10  CREATININE 0.81 0.51*  CALCIUM 9.3 9.1   GFR: Estimated Creatinine Clearance: 102.7 mL/min (A) (by C-G formula based on SCr of 0.51 mg/dL (L)). Liver Function Tests: Recent Labs  Lab 08/01/19 0137  AST 17  ALT 18  ALKPHOS 92  BILITOT 0.7  PROT 8.2*  ALBUMIN 3.9   Recent Labs  Lab 08/01/19 0137  LIPASE 20   No results for input(s): AMMONIA in the last 168 hours. Coagulation Profile: Recent Labs  Lab 08/01/19 0610  INR 0.9   Cardiac Enzymes: No results for input(s): CKTOTAL, CKMB, CKMBINDEX, TROPONINI in the last 168 hours. BNP (last 3 results) No results for input(s): PROBNP in the last 8760 hours. HbA1C: Recent Labs    08/01/19 0731  HGBA1C 11.3*   CBG: Recent Labs  Lab 08/01/19 1210 08/01/19 1540 08/01/19 2111 08/02/19 0743 08/02/19 1152  GLUCAP 246* 299* 241* 229* 323*   Lipid Profile: No results for input(s): CHOL, HDL, LDLCALC, TRIG, CHOLHDL, LDLDIRECT in the last 72 hours. Thyroid Function Tests: No results for input(s): TSH, T4TOTAL, FREET4, T3FREE, THYROIDAB in the last 72 hours. Anemia Panel: No results for input(s): VITAMINB12, FOLATE, FERRITIN, TIBC, IRON, RETICCTPCT in the last 72 hours. Sepsis Labs: No results for input(s): PROCALCITON, LATICACIDVEN in the last 168 hours.  Recent Results (from the past 240 hour(s))  SARS Coronavirus 2 by RT PCR (hospital order, performed in [facility] hospital lab) Nasopharyngeal Nasopharyngeal Swab     Status: None   Collection Time: 08/01/19  6:10 AM   Specimen: Nasopharyngeal Swab  Result Value Ref Range Status   SARS  Coronavirus 2 NEGATIVE NEGATIVE Final    Comment: (NOTE) SARS-CoV-2 target nucleic acids are NOT DETECTED. The SARS-CoV-2 RNA is generally detectable in upper and lower respiratory specimens during the acute phase of infection. The lowest concentration of SARS-CoV-2 viral copies this assay can detect is 250 copies / mL. A negative result does not preclude SARS-CoV-2 infection and should not be used as the sole basis for treatment or other patient management decisions.  A negative result may occur with improper specimen collection / handling, submission of specimen other than nasopharyngeal swab, presence of viral mutation(s) within the areas targeted by this assay, and inadequate number of viral copies (<250 copies / mL). A negative result must be combined with clinical observations, patient history, and epidemiological information. Fact Sheet for Patients:   StrictlyIdeas.no Fact Sheet for Healthcare Providers: BankingDealers.co.za This test is not yet approved or cleared  by the [place] FDA and has been authorized for detection and/or diagnosis of SARS-CoV-2 by FDA under an Emergency  Use Authorization (EUA).  This EUA will remain in effect (meaning this test can be used) for the duration of the COVID-19 declaration under Section 564(b)(1) of the Act, 21 U.S.C. section 360bbb-3(b)(1), unless the authorization is terminated or revoked sooner. Performed at [facility], [address], [place], [place] [address]      Radiology Studies: CT Abdomen Pelvis W Contrast  Result Date: 08/01/2019 CLINICAL DATA:  Left upper quadrant abdominal pain x4 days EXAM: CT ABDOMEN AND PELVIS WITH CONTRAST TECHNIQUE: Multidetector CT imaging of the abdomen and pelvis was performed using the standard protocol following bolus administration of intravenous contrast. CONTRAST:  161mL OMNIPAQUE IOHEXOL 300 MG/ML  SOLN COMPARISON:  None. FINDINGS:  Lower chest: Lung bases are clear. Hepatobiliary: Liver is within normal limits. Gallbladder is unremarkable. No intrahepatic or extrahepatic ductal dilatation. Pancreas: Within normal limits. Spleen: Heterogeneous hypoperfusion of the spleen, which persists on delayed imaging (series 7/image 1), suggesting developing splenic infarcts. Splenic vein is patent. However, there is splenic artery occlusion. Adrenals/Urinary Tract: Within normal limits. Kidneys are within normal limits.  No hydronephrosis. Bladder is mildly thick-walled although underdistended. Stomach/Bowel: Stomach is notable for a tiny hiatal hernia. No evidence of bowel obstruction. Appendix is not discretely visualized. Vascular/Lymphatic: No evidence of abdominal aortic aneurysm. Mild atherosclerotic calcifications the bilateral common iliac arteries. Splenic artery occlusion, as described above. Reproductive: Prostate is unremarkable. Other: No abdominopelvic ascites. Musculoskeletal: Status post PLIF at L4-5. Mild degenerative changes of the visualized thoracolumbar spine. IMPRESSION: Developing splenic infarcts, likely secondary to splenic artery occlusion. Splenic vein is patent. These results were called by telephone at the time of interpretation on 08/01/2019 at 5:40 am to provider Dr Alfred Levins, who verbally acknowledged these results. Electronically Signed   By: Julian Hy M.D.   On: 08/01/2019 05:40    Scheduled Meds: . atorvastatin  40 mg Oral Daily  . dextromethorphan-guaiFENesin  1 tablet Oral BID  . insulin aspart  0-9 Units Subcutaneous TID WC  . insulin aspart  3 Units Subcutaneous TID WC  . insulin glargine  15 Units Subcutaneous QHS  . nicotine  21 mg Transdermal Daily   Continuous Infusions: . heparin 1,450 Units/hr (08/02/19 0808)     LOS: 1 day   Time spent: 45 minutes.  [name], MD [facility]  If 7PM-7AM, please contact night-coverage [email or web address]  08/02/2019, 2:10 PM   This record has  been created using Systems analyst. Errors have been sought and corrected,but may not always be located. Such creation errors do not reflect on the standard of care.

## 2019-08-02 NOTE — Consult Note (Signed)
ANTICOAGULATION CONSULT NOTE -  Pharmacy Consult for Heparin Drip Indication: Splenic Artery Occlusion  Allergies  Allergen Reactions  . Wasp Venom Anaphylaxis  . Coffee Flavor Nausea And Vomiting    Patient states coffee gives him nausea and stomach cramps  . Onion     Patient Measurements: Height: 5\' 4"  (162.6 cm) Weight: 81.2 kg (179 lb) IBW/kg (Calculated) : 59.2 Heparin Dosing Weight: 76.2 kg  Vital Signs: Temp: 98 F (36.7 C) (05/24 2012) Temp Source: Oral (05/24 2012) BP: 123/85 (05/24 2012) Pulse Rate: 94 (05/24 2012)  Labs: Recent Labs    08/01/19 0137 08/01/19 0610 08/01/19 1352 08/02/19 0556 08/02/19 1352 08/02/19 2034  HGB 15.5  --   --  14.9  --   --   HCT 45.9  --   --  44.3  --   --   PLT 383  --   --  377  --   --   APTT  --  32  --   --   --   --   LABPROT  --  11.3*  --   --   --   --   INR  --  0.9  --   --   --   --   HEPARINUNFRC  --   --    < > 0.25* 0.36 0.40  CREATININE 0.81  --   --  0.51*  --   --    < > = values in this interval not displayed.    Estimated Creatinine Clearance: 102.7 mL/min (A) (by C-G formula based on SCr of 0.51 mg/dL (L)).   Medical History: Past Medical History:  Diagnosis Date  . COPD (chronic obstructive pulmonary disease) ([place])   . Diabetes mellitus without complication ([place])   . Hyperlipemia   . Tobacco abuse     Medications:  Medications Prior to Admission  Medication Sig Dispense Refill Last Dose  . atorvastatin (LIPITOR) 40 MG tablet Take 40 mg by mouth daily.   Not Taking at Unknown time  . diphenhydrAMINE (BENADRYL) 25 MG tablet Take 1 tablet (25 mg total) by mouth every 6 (six) hours as needed. 30 tablet 0   . EPINEPHrine 0.3 mg/0.3 mL IJ SOAJ injection Inject 0.3 mLs (0.3 mg total) into the muscle once. Follow package instructions as needed for severe allergy or anaphylactic reaction. (Patient not taking: Reported on 08/01/2019) 1 Device 2 Not Taking at Unknown time  . metFORMIN (GLUCOPHAGE) 500  MG tablet Take by mouth 2 (two) times daily with a meal.   Not Taking at Unknown time  . predniSONE (DELTASONE) 20 MG tablet Take 2 tablets (40 mg total) by mouth daily. 8 tablet 0    Scheduled:  . atorvastatin  40 mg Oral Daily  . dextromethorphan-guaiFENesin  1 tablet Oral BID  . insulin aspart  0-9 Units Subcutaneous TID WC  . insulin aspart  3 Units Subcutaneous TID WC  . insulin glargine  15 Units Subcutaneous QHS  . nicotine  21 mg Transdermal Daily   Infusions:  . heparin 1,450 Units/hr (08/02/19 2112)   PRN: acetaminophen, albuterol, iohexol, morphine injection, ondansetron (ZOFRAN) IV, oxyCODONE-acetaminophen, senna-docusate Anti-infectives (From admission, onward)   None      Assessment: Pharmacy has been consulted to initiate heparin infusion in 54yo patient complaining of left upper quadrant abdominal pain x 4 days. CT of abdomen shows developing splenic infarcts, likely secondary to splenic artery occlusion. Splenic vein is patent. Patient with no history of PTA anticoagulant  use. Baseline labs: Hgb 15.5, Plts 383, aPTT/INR pending. Will initiate heparin drip immediately.  Given 4500 units bolus and heparin infusion started at 1300 units/hr  5/24 2034 HL 0.4    Goal of Therapy:  Heparin level 0.3-0.7 units/ml Monitor platelets by anticoagulation protocol: Yes   Plan:  Heparin level is therapeutic x 2 - Will continue drip rate of 1450 units/hr. Will check HL and CBC with AM labs.   Check anti-Xa level with AM labs. Continue to monitor H&H and platelets  Oswald Hillock, PharmD Clinical Pharmacist 08/02/2019 9:43 PM

## 2019-08-03 LAB — CBC
HCT: 44.6 % (ref 39.0–52.0)
Hemoglobin: 15 g/dL (ref 13.0–17.0)
MCH: 30.2 pg (ref 26.0–34.0)
MCHC: 33.6 g/dL (ref 30.0–36.0)
MCV: 89.7 fL (ref 80.0–100.0)
Platelets: 404 10*3/uL — ABNORMAL HIGH (ref 150–400)
RBC: 4.97 MIL/uL (ref 4.22–5.81)
RDW: 12.2 % (ref 11.5–15.5)
WBC: 9.9 10*3/uL (ref 4.0–10.5)
nRBC: 0 % (ref 0.0–0.2)

## 2019-08-03 LAB — CARDIOLIPIN ANTIBODIES, IGG, IGM, IGA
Anticardiolipin IgA: <9 APL U/mL (ref 0–11)
Anticardiolipin IgG: <9 GPL U/mL (ref 0–14)
Anticardiolipin IgM: <9 MPL U/mL (ref 0–12)

## 2019-08-03 LAB — PROTEIN C ACTIVITY: Protein C Activity: 165 % (ref 73–180)

## 2019-08-03 LAB — GLUCOSE, CAPILLARY
Glucose-Capillary: 182 mg/dL — ABNORMAL HIGH (ref 70–99)
Glucose-Capillary: 263 mg/dL — ABNORMAL HIGH (ref 70–99)
Glucose-Capillary: 143 mg/dL — ABNORMAL HIGH (ref 70–99)
Glucose-Capillary: 224 mg/dL — ABNORMAL HIGH (ref 70–99)

## 2019-08-03 LAB — PROTEIN S, TOTAL: Protein S Ag, Total: 120 % (ref 60–150)

## 2019-08-03 LAB — HEPARIN LEVEL (UNFRACTIONATED): Heparin Unfractionated: 0.39 IU/mL (ref 0.30–0.70)

## 2019-08-03 LAB — PROTEIN S ACTIVITY: Protein S Activity: 117 % (ref 63–140)

## 2019-08-03 NOTE — Consult Note (Signed)
ANTICOAGULATION CONSULT NOTE -  Pharmacy Consult for Heparin Drip Indication: Splenic Artery Occlusion  Allergies  Allergen Reactions  . Wasp Venom Anaphylaxis  . Coffee Flavor Nausea And Vomiting    Patient states coffee gives him nausea and stomach cramps  . Onion    Patient Measurements: Height: 5\' 4"  (162.6 cm) Weight: 81.2 kg (179 lb) IBW/kg (Calculated) : 59.2 Heparin Dosing Weight: 76.2 kg  Vital Signs: Temp: 98.5 F (36.9 C) (05/25 0514) Temp Source: Oral (05/25 0514) BP: 108/79 (05/25 0514) Pulse Rate: 107 (05/25 0514)  Labs: Recent Labs     0000 08/01/19 0137 08/01/19 0610 08/01/19 1352 08/02/19 0556 08/02/19 0556 08/02/19 1352 08/02/19 2034 08/03/19 0349  HGB   < > 15.5  --   --  14.9  --   --   --  15.0  HCT  --  45.9  --   --  44.3  --   --   --  44.6  PLT  --  383  --   --  377  --   --   --  404*  APTT  --   --  32  --   --   --   --   --   --   LABPROT  --   --  11.3*  --   --   --   --   --   --   INR  --   --  0.9  --   --   --   --   --   --   HEPARINUNFRC  --   --   --    < > 0.25*   < > 0.36 0.40 0.39  CREATININE  --  0.81  --   --  0.51*  --   --   --   --    < > = values in this interval not displayed.    Estimated Creatinine Clearance: 102.7 mL/min (A) (by C-G formula based on SCr of 0.51 mg/dL (L)).  Medical History: Past Medical History:  Diagnosis Date  . COPD (chronic obstructive pulmonary disease) ([place])   . Diabetes mellitus without complication ([place])   . Hyperlipemia   . Tobacco abuse     Medications:  Medications Prior to Admission  Medication Sig Dispense Refill Last Dose  . atorvastatin (LIPITOR) 40 MG tablet Take 40 mg by mouth daily.   Not Taking at Unknown time  . diphenhydrAMINE (BENADRYL) 25 MG tablet Take 1 tablet (25 mg total) by mouth every 6 (six) hours as needed. 30 tablet 0   . EPINEPHrine 0.3 mg/0.3 mL IJ SOAJ injection Inject 0.3 mLs (0.3 mg total) into the muscle once. Follow package instructions as needed  for severe allergy or anaphylactic reaction. (Patient not taking: Reported on 08/01/2019) 1 Device 2 Not Taking at Unknown time  . metFORMIN (GLUCOPHAGE) 500 MG tablet Take by mouth 2 (two) times daily with a meal.   Not Taking at Unknown time  . predniSONE (DELTASONE) 20 MG tablet Take 2 tablets (40 mg total) by mouth daily. 8 tablet 0    Scheduled:  . atorvastatin  40 mg Oral Daily  . dextromethorphan-guaiFENesin  1 tablet Oral BID  . insulin aspart  0-9 Units Subcutaneous TID WC  . insulin aspart  3 Units Subcutaneous TID WC  . insulin glargine  15 Units Subcutaneous QHS  . nicotine  21 mg Transdermal Daily   Infusions:  . heparin 1,450 Units/hr (08/03/19 0547)  PRN: acetaminophen, albuterol, iohexol, morphine injection, ondansetron (ZOFRAN) IV, oxyCODONE-acetaminophen, senna-docusate Anti-infectives (From admission, onward)   None     Assessment: Pharmacy has been consulted to initiate heparin infusion in 54yo patient complaining of left upper quadrant abdominal pain x 4 days. CT of abdomen shows developing splenic infarcts, likely secondary to splenic artery occlusion. Splenic vein is patent. Patient with no history of PTA anticoagulant use. Baseline labs: Hgb 15.5, Plts 383, aPTT/INR pending. Will initiate heparin drip immediately.  Given 4500 units bolus and heparin infusion started at 1300 units/hr  5/24 2034 HL 0.4  0525 0349 HL 0.39, CBC stable  Goal of Therapy:  Heparin level 0.3-0.7 units/ml Monitor platelets by anticoagulation protocol: Yes   Plan:  Heparin level is therapeutic x 3 - Will continue drip rate of 1450 units/hr. Will check HL and CBC with AM labs.   Check anti-Xa level with AM labs. Continue to monitor H&H and platelets  Ena Dawley, PharmD Clinical Pharmacist 54/25/2021 5:52 AM

## 2019-08-03 NOTE — Progress Notes (Signed)
PROGRESS NOTE    Benjamin Conley  [id number] DOB: 10/07/1965 DOA: 08/01/2019 PCP: Charlotte Sanes, MD   Brief Narrative:  Benjamin Conley is a 54 y.o. male with medical history significant of hyperlipidemia, diabetes mellitus, tobacco abuse, COPD, who presents with abdominal pain. CT abdomen/pelvis that showed developing splenic infarcts, likely secondary to splenic artery occlusion, and splenic vein is patent. Pt is admitted to [place] bed as inpatient by accepting MD.  ED physician discussed with vascular surgeon, Dr. Teola Bradley.  And they advise starting him on heparin infusion.  Subjective: His left upper quadrant pain is improving, continue to have some dull ache.  Denies any nausea or vomiting.  Assessment & Plan:   Principal Problem:   Splenic infarct Active Problems:   Diabetes mellitus without complication (HCC)   Hyperlipemia   COPD with chronic bronchitis (HCC)   Tobacco abuse  Splenic infarct: EDP discussed with vascular surgeon, Dr. Teola Bradley, "who recommended putting patient on heparin and admitted to the hospitalist for close monitoring for possibility of splenic rupture and for evaluation of possible etiology of splenic artery occlusion". -Hypercoagulable labs so far are normal homocystine, protein C and S, borderline elevation of Antithrombin III function, rest of the labs are pending. \-Continue with heparin infusion. -Continue with pain management. -Continue to monitor as he is high risk for rupture. -Started vascular surgery today, apparently was not in their list-awaiting further recommendations  Type 2 diabetes.  Prior A1c in the system was done in 2017 and it was 6.8.  Since then he stopped taking his insulin.  Repeat A1c 11.3 with hyperglycemia. Discussed in detail regarding taking care of his diabetes to prevent complications which can drastically decreased his quality of life. -Start him on Lantus 15 units at bedtime. -3 Units of NovoLog with  meals. -Continue with SSI  Hyperlipemia -lipitor  COPD with chronic bronchitis ([place]): stable -prn albuterol and Mucinex  Tobacco abuse: -Did counseling about importance of quitting smoking -Nicotine patch   Objective: Vitals:   08/02/19 1154 08/02/19 2012 08/03/19 0514 08/03/19 1142  BP: 122/86 123/85 108/79 112/80  Pulse: (!) 107 94 (!) 107 97  Resp:  18 18 18   Temp: 98.7 F (37.1 C) 98 F (36.7 C) 98.5 F (36.9 C) 98.4 F (36.9 C)  TempSrc: Oral Oral Oral Oral  SpO2: 95% 98% 96% 96%  Weight:      Height:        Intake/Output Summary (Last 24 hours) at 08/03/2019 1520 Last data filed at 08/03/2019 1330 Gross per 24 hour  Intake 719.65 ml  Output 1050 ml  Net -330.35 ml   Filed Weights   08/01/19 0124  Weight: 81.2 kg    Examination:  General exam: Appears calm and comfortable  Respiratory system: Clear to auscultation. Respiratory effort normal. Cardiovascular system: S1 & S2 heard, RRR. No JVD, murmurs, rubs, gallops or clicks. Gastrointestinal system: Soft, LUQ tenderness, nondistended, bowel sounds positive. Central nervous system: Alert and oriented. No focal neurological deficits.Symmetric 5 x 5 power. Extremities: No edema, no cyanosis, pulses intact and symmetrical. Skin: No rashes, lesions or ulcers Psychiatry: Judgement and insight appear normal. Mood & affect appropriate.    DVT prophylaxis: Heparin Code Status: Full Family Communication: Discussed with patient Disposition Plan:  Status is: Inpatient  Remains inpatient appropriate because:IV treatments appropriate due to intensity of illness or inability to take PO   Dispo: The patient is from: Home              Anticipated  d/c is to: Home              Anticipated d/c date is: 2 days              Patient currently is not medically stable to d/c.  Patient is currently on heparin infusion for splenic infarct, pending lab work to find a cause.  Pending vascular surgery  recommendations.   Consultants:   Vascular surgery.  Procedures:  Antimicrobials:   Data Reviewed: I have personally reviewed following labs and imaging studies  CBC: Recent Labs  Lab 08/01/19 0137 08/02/19 0556 08/03/19 0349  WBC 11.6* 10.4 9.9  HGB 15.5 14.9 15.0  HCT 45.9 44.3 44.6  MCV 89.0 89.3 89.7  PLT 383 377 [id number]*   Basic Metabolic Panel: Recent Labs  Lab 08/01/19 0137 08/02/19 0556  NA 133* 135  K 3.9 4.1  CL 95* 97*  CO2 26 27  GLUCOSE 285* 231*  BUN 17 10  CREATININE 0.81 0.51*  CALCIUM 9.3 9.1   GFR: Estimated Creatinine Clearance: 102.7 mL/min (A) (by C-G formula based on SCr of 0.51 mg/dL (L)). Liver Function Tests: Recent Labs  Lab 08/01/19 0137  AST 17  ALT 18  ALKPHOS 92  BILITOT 0.7  PROT 8.2*  ALBUMIN 3.9   Recent Labs  Lab 08/01/19 0137  LIPASE 20   No results for input(s): AMMONIA in the last 168 hours. Coagulation Profile: Recent Labs  Lab 08/01/19 0610  INR 0.9   Cardiac Enzymes: No results for input(s): CKTOTAL, CKMB, CKMBINDEX, TROPONINI in the last 168 hours. BNP (last 3 results) No results for input(s): PROBNP in the last 8760 hours. HbA1C: Recent Labs    08/01/19 0731  HGBA1C 11.3*   CBG: Recent Labs  Lab 08/02/19 1152 08/02/19 1634 08/02/19 2106 08/03/19 0751 08/03/19 1141  GLUCAP 323* 191* 240* 182* 263*   Lipid Profile: No results for input(s): CHOL, HDL, LDLCALC, TRIG, CHOLHDL, LDLDIRECT in the last 72 hours. Thyroid Function Tests: No results for input(s): TSH, T4TOTAL, FREET4, T3FREE, THYROIDAB in the last 72 hours. Anemia Panel: No results for input(s): VITAMINB12, FOLATE, FERRITIN, TIBC, IRON, RETICCTPCT in the last 72 hours. Sepsis Labs: No results for input(s): PROCALCITON, LATICACIDVEN in the last 168 hours.  Recent Results (from the past 240 hour(s))  SARS Coronavirus 2 by RT PCR (hospital order, performed in [facility] hospital lab) Nasopharyngeal Nasopharyngeal Swab     Status: None    Collection Time: 08/01/19  6:10 AM   Specimen: Nasopharyngeal Swab  Result Value Ref Range Status   SARS Coronavirus 2 NEGATIVE NEGATIVE Final    Comment: (NOTE) SARS-CoV-2 target nucleic acids are NOT DETECTED. The SARS-CoV-2 RNA is generally detectable in upper and lower respiratory specimens during the acute phase of infection. The lowest concentration of SARS-CoV-2 viral copies this assay can detect is 250 copies / mL. A negative result does not preclude SARS-CoV-2 infection and should not be used as the sole basis for treatment or other patient management decisions.  A negative result may occur with improper specimen collection / handling, submission of specimen other than nasopharyngeal swab, presence of viral mutation(s) within the areas targeted by this assay, and inadequate number of viral copies (<250 copies / mL). A negative result must be combined with clinical observations, patient history, and epidemiological information. Fact Sheet for Patients:   StrictlyIdeas.no Fact Sheet for Healthcare Providers: BankingDealers.co.za This test is not yet approved or cleared  by the [place] FDA and has been authorized  for detection and/or diagnosis of SARS-CoV-2 by FDA under an Emergency Use Authorization (EUA).  This EUA will remain in effect (meaning this test can be used) for the duration of the COVID-19 declaration under Section 564(b)(1) of the Act, 21 U.S.C. section 360bbb-3(b)(1), unless the authorization is terminated or revoked sooner. Performed at [facility], [address], [place], [place] [address]      Radiology Studies: No results found.  Scheduled Meds: . atorvastatin  40 mg Oral Daily  . dextromethorphan-guaiFENesin  1 tablet Oral BID  . insulin aspart  0-9 Units Subcutaneous TID WC  . insulin aspart  3 Units Subcutaneous TID WC  . insulin glargine  15 Units Subcutaneous QHS  . nicotine  21  mg Transdermal Daily   Continuous Infusions: . heparin 1,450 Units/hr (08/03/19 1035)     LOS: 2 days   Time spent: 40 minutes.  [name], MD [facility]  If 7PM-7AM, please contact night-coverage [email or web address]  08/03/2019, 3:20 PM   This record has been created using Systems analyst. Errors have been sought and corrected,but may not always be located. Such creation errors do not reflect on the standard of care.

## 2019-08-04 ENCOUNTER — Encounter: Payer: Self-pay | Admitting: Internal Medicine

## 2019-08-04 ENCOUNTER — Inpatient Hospital Stay: Payer: Medicare HMO

## 2019-08-04 LAB — HEPARIN LEVEL (UNFRACTIONATED)
Heparin Unfractionated: 0.29 IU/mL — ABNORMAL LOW (ref 0.30–0.70)
Heparin Unfractionated: 0.35 IU/mL (ref 0.30–0.70)
Heparin Unfractionated: 0.4 IU/mL (ref 0.30–0.70)

## 2019-08-04 LAB — GLUCOSE, CAPILLARY
Glucose-Capillary: 130 mg/dL — ABNORMAL HIGH (ref 70–99)
Glucose-Capillary: 195 mg/dL — ABNORMAL HIGH (ref 70–99)
Glucose-Capillary: 259 mg/dL — ABNORMAL HIGH (ref 70–99)
Glucose-Capillary: 193 mg/dL — ABNORMAL HIGH (ref 70–99)

## 2019-08-04 LAB — CBC
HCT: 43.3 % (ref 39.0–52.0)
Hemoglobin: 14.7 g/dL (ref 13.0–17.0)
MCH: 29.8 pg (ref 26.0–34.0)
MCHC: 33.9 g/dL (ref 30.0–36.0)
MCV: 87.7 fL (ref 80.0–100.0)
Platelets: 422 10*3/uL — ABNORMAL HIGH (ref 150–400)
RBC: 4.94 MIL/uL (ref 4.22–5.81)
RDW: 12.2 % (ref 11.5–15.5)
WBC: 10 10*3/uL (ref 4.0–10.5)
nRBC: 0 % (ref 0.0–0.2)

## 2019-08-04 LAB — DRVVT CONFIRM: dRVVT Confirm: 1.1 ratio (ref 0.8–1.2)

## 2019-08-04 LAB — DRVVT MIX: dRVVT Mix: 43 s — ABNORMAL HIGH (ref 0.0–40.4)

## 2019-08-04 LAB — LUPUS ANTICOAGULANT PANEL
DRVVT: 51 s — ABNORMAL HIGH (ref 0.0–47.0)
PTT Lupus Anticoagulant: 39.5 s (ref 0.0–51.9)

## 2019-08-04 LAB — BETA-2-GLYCOPROTEIN I ABS, IGG/M/A
Beta-2 Glyco I IgG: <9 GPI IgG units (ref 0–20)
Beta-2-Glycoprotein I IgA: <9 GPI IgA units (ref 0–25)
Beta-2-Glycoprotein I IgM: <9 GPI IgM units (ref 0–32)

## 2019-08-04 LAB — PROTEIN C, TOTAL: Protein C, Total: 152 % — ABNORMAL HIGH (ref 60–150)

## 2019-08-04 MED ORDER — IOHEXOL 350 MG/ML SOLN
75.0000 mL | Freq: Once | INTRAVENOUS | Status: AC | PRN
  Administered 2019-08-04: 75 mL via INTRAVENOUS

## 2019-08-04 NOTE — Progress Notes (Signed)
Inpatient Diabetes Program Recommendations  AACE/ADA: New Consensus Statement on Inpatient Glycemic Control   Target Ranges:  Prepandial:   less than 140 mg/dL      Peak postprandial:   less than 180 mg/dL (1-2 hours)      Critically ill patients:  140 - 180 mg/dL   Results for [name], Benjamin Conley (MRN [id number]) as of 08/04/2019 08:21  Ref. Range 08/03/2019 07:51 08/03/2019 11:41 08/03/2019 16:46 08/03/2019 21:15 08/04/2019 08:02  Glucose-Capillary Latest Ref Range: 70 - 99 mg/dL 182 (H) 263 (H) 143 (H) 224 (H) 130 (H)   Review of Glycemic Control  Diabetes history: DM2 Outpatient Diabetes medications: Metformin 500 mg BID Current orders for Inpatient glycemic control: Lantus 15 units QHS, Novolog 3 units TID with meals for meal coverage, Novolog 0-9 units TID with meals  Inpatient Diabetes Program Recommendations:   Insulin - Meal Coverage: Please consider increasing meal coverage to Novolog 5 units TID with meals if patient eats at least 50% of meals.  Thanks, Barnie Alderman, RN, MSN, CDE Diabetes Coordinator Inpatient Diabetes Program [phone] (Team Pager from 8am to 5pm)

## 2019-08-04 NOTE — Consult Note (Signed)
ANTICOAGULATION CONSULT NOTE -  Pharmacy Consult for Heparin Drip Indication: Splenic Artery Occlusion  Allergies  Allergen Reactions  . Wasp Venom Anaphylaxis  . Coffee Flavor Nausea And Vomiting    Patient states coffee gives him nausea and stomach cramps  . Onion    Patient Measurements: Height: 5\' 4"  (162.6 cm) Weight: 81.2 kg (179 lb) IBW/kg (Calculated) : 59.2 Heparin Dosing Weight: 76.2 kg  Vital Signs: Temp: 98.2 F (36.8 C) (05/26 1138) Temp Source: Oral (05/26 1138) BP: 119/71 (05/26 1138) Pulse Rate: 110 (05/26 1138)  Labs: Recent Labs    08/02/19 0556 08/02/19 1352 08/03/19 0349 08/04/19 0411 08/04/19 1200  HGB 14.9  --  15.0 14.7  --   HCT 44.3  --  44.6 43.3  --   PLT 377  --  404* 422*  --   HEPARINUNFRC 0.25*   < > 0.39 0.29* 0.35  CREATININE 0.51*  --   --   --   --    < > = values in this interval not displayed.    Estimated Creatinine Clearance: 102.7 mL/min (A) (by C-G formula based on SCr of 0.51 mg/dL (L)).  Medical History: Past Medical History:  Diagnosis Date  . COPD (chronic obstructive pulmonary disease) ([place])   . Diabetes mellitus without complication ([place])   . Hyperlipemia   . Tobacco abuse     Medications:  Medications Prior to Admission  Medication Sig Dispense Refill Last Dose  . atorvastatin (LIPITOR) 40 MG tablet Take 40 mg by mouth daily.   Not Taking at Unknown time  . diphenhydrAMINE (BENADRYL) 25 MG tablet Take 1 tablet (25 mg total) by mouth every 6 (six) hours as needed. 30 tablet 0   . EPINEPHrine 0.3 mg/0.3 mL IJ SOAJ injection Inject 0.3 mLs (0.3 mg total) into the muscle once. Follow package instructions as needed for severe allergy or anaphylactic reaction. (Patient not taking: Reported on 08/01/2019) 1 Device 2 Not Taking at Unknown time  . metFORMIN (GLUCOPHAGE) 500 MG tablet Take by mouth 2 (two) times daily with a meal.   Not Taking at Unknown time  . predniSONE (DELTASONE) 20 MG tablet Take 2 tablets (40 mg  total) by mouth daily. 8 tablet 0    Scheduled:  . atorvastatin  40 mg Oral Daily  . dextromethorphan-guaiFENesin  1 tablet Oral BID  . insulin aspart  0-9 Units Subcutaneous TID WC  . insulin aspart  3 Units Subcutaneous TID WC  . insulin glargine  15 Units Subcutaneous QHS  . nicotine  21 mg Transdermal Daily   Infusions:  . heparin 1,550 Units/hr (08/04/19 0532)   PRN: acetaminophen, albuterol, iohexol, morphine injection, ondansetron (ZOFRAN) IV, oxyCODONE-acetaminophen, senna-docusate Anti-infectives (From admission, onward)   None     Assessment: Pharmacy has been consulted to initiate heparin infusion in 53yo patient complaining of left upper quadrant abdominal pain x 4 days. CT of abdomen shows developing splenic infarcts, likely secondary to splenic artery occlusion. Splenic vein is patent. Patient with no history of PTA anticoagulant use. Baseline labs: Hgb 15.5, Plts 383, aPTT/INR pending. Will initiate heparin drip immediately.  Given 4500 units bolus and heparin infusion started at 1300 units/hr  5/24 2034 HL 0.4  0525 0349 HL 0.39, CBC stable 0526 0411 HL 0.29, subtherapeutic x 1.  CBC stable.  0526 1200 HL 0.35  Goal of Therapy:  Heparin level 0.3-0.7 units/ml Monitor platelets by anticoagulation protocol: Yes   Plan:  Heparin level is therapeutic.  - Will continue  heparin drip @ 1550 units/hr. Will check HL in 6 hours and CBC daily.   Continue to monitor H&H and platelets  Lu Duffel, PharmD Clinical Pharmacist 08/04/2019 2:13 PM

## 2019-08-04 NOTE — Consult Note (Signed)
ANTICOAGULATION CONSULT NOTE -  Pharmacy Consult for Heparin Drip Indication: Splenic Artery Occlusion  Allergies  Allergen Reactions  . Wasp Venom Anaphylaxis  . Coffee Flavor Nausea And Vomiting    Patient states coffee gives him nausea and stomach cramps  . Onion    Patient Measurements: Height: 5\' 4"  (162.6 cm) Weight: 81.2 kg (179 lb) IBW/kg (Calculated) : 59.2 Heparin Dosing Weight: 76.2 kg  Vital Signs: Temp: 98 F (36.7 C) (05/26 0434) Temp Source: Oral (05/26 0434) BP: 122/80 (05/26 0434) Pulse Rate: 91 (05/26 0434)  Labs: Recent Labs    08/01/19 0610 08/01/19 1352 08/02/19 0556 08/02/19 0556 08/02/19 1352 08/02/19 2034 08/03/19 0349 08/04/19 0411  HGB  --   --  14.9   < >  --   --  15.0 14.7  HCT  --   --  44.3  --   --   --  44.6 43.3  PLT  --   --  377  --   --   --  404* 422*  APTT 32  --   --   --   --   --   --   --   LABPROT 11.3*  --   --   --   --   --   --   --   INR 0.9  --   --   --   --   --   --   --   HEPARINUNFRC  --    < > 0.25*  --    < > 0.40 0.39 0.29*  CREATININE  --   --  0.51*  --   --   --   --   --    < > = values in this interval not displayed.    Estimated Creatinine Clearance: 102.7 mL/min (A) (by C-G formula based on SCr of 0.51 mg/dL (L)).  Medical History: Past Medical History:  Diagnosis Date  . COPD (chronic obstructive pulmonary disease) ([place])   . Diabetes mellitus without complication ([place])   . Hyperlipemia   . Tobacco abuse     Medications:  Medications Prior to Admission  Medication Sig Dispense Refill Last Dose  . atorvastatin (LIPITOR) 40 MG tablet Take 40 mg by mouth daily.   Not Taking at Unknown time  . diphenhydrAMINE (BENADRYL) 25 MG tablet Take 1 tablet (25 mg total) by mouth every 6 (six) hours as needed. 30 tablet 0   . EPINEPHrine 0.3 mg/0.3 mL IJ SOAJ injection Inject 0.3 mLs (0.3 mg total) into the muscle once. Follow package instructions as needed for severe allergy or anaphylactic reaction.  (Patient not taking: Reported on 08/01/2019) 1 Device 2 Not Taking at Unknown time  . metFORMIN (GLUCOPHAGE) 500 MG tablet Take by mouth 2 (two) times daily with a meal.   Not Taking at Unknown time  . predniSONE (DELTASONE) 20 MG tablet Take 2 tablets (40 mg total) by mouth daily. 8 tablet 0    Scheduled:  . atorvastatin  40 mg Oral Daily  . dextromethorphan-guaiFENesin  1 tablet Oral BID  . insulin aspart  0-9 Units Subcutaneous TID WC  . insulin aspart  3 Units Subcutaneous TID WC  . insulin glargine  15 Units Subcutaneous QHS  . nicotine  21 mg Transdermal Daily   Infusions:  . heparin 1,450 Units/hr (08/04/19 0211)   PRN: acetaminophen, albuterol, iohexol, morphine injection, ondansetron (ZOFRAN) IV, oxyCODONE-acetaminophen, senna-docusate Anti-infectives (From admission, onward)   None     Assessment:  Pharmacy has been consulted to initiate heparin infusion in 54yo patient complaining of left upper quadrant abdominal pain x 4 days. CT of abdomen shows developing splenic infarcts, likely secondary to splenic artery occlusion. Splenic vein is patent. Patient with no history of PTA anticoagulant use. Baseline labs: Hgb 15.5, Plts 383, aPTT/INR pending. Will initiate heparin drip immediately.  Given 4500 units bolus and heparin infusion started at 1300 units/hr  5/24 2034 HL 0.4  0525 0349 HL 0.39, CBC stable 0526 0411 HL 0.29, subtherapeutic x 1.  CBC stable.   Goal of Therapy:  Heparin level 0.3-0.7 units/ml Monitor platelets by anticoagulation protocol: Yes   Plan:  Heparin level is slightly subtherapeutic.  - Will increase Heparin drip to 1550 units/hr. Will check HL in 6 hours and CBC daily.   Continue to monitor H&H and platelets  Ena Dawley, PharmD Clinical Pharmacist 08/04/2019 5:30 AM

## 2019-08-04 NOTE — Progress Notes (Signed)
PROGRESS NOTE    Benjamin Conley  [id number] DOB: 26-Sep-1965 DOA: 08/01/2019 PCP: Charlotte Sanes, MD   Brief Narrative:  Benjamin Conley is a 54 y.o. male with medical history significant of hyperlipidemia, diabetes mellitus, tobacco abuse, COPD, who presents with abdominal pain. CT abdomen/pelvis that showed developing splenic infarcts, likely secondary to splenic artery occlusion, and splenic vein is patent. Pt is admitted to [facility] bed as inpatient by accepting MD.  ED physician discussed with vascular surgeon, Dr. Teola Bradley.  And they advise starting him on heparin infusion.  Subjective: Patient has no new complaints today,His left upper quadrant pain is improving,   Assessment & Plan:   Principal Problem:   Splenic infarct Active Problems:   Diabetes mellitus without complication (HCC)   Hyperlipemia   COPD with chronic bronchitis (HCC)   Tobacco abuse  Splenic infarct: EDP discussed with vascular surgeon, Dr. Teola Bradley, "who recommended putting patient on heparin and admitted to the hospitalist for close monitoring for possibility of splenic rupture and for evaluation of possible etiology of splenic artery occlusion". -Hypercoagulable labs so far are normal homocystine, protein C and S, borderline elevation of Antithrombin III function, dRVVT Mix mildly elevated,rest of the labs are pending. CTA of chest and abdomen with a small dissection and/or pseudoaneurysm of the distal splenic artery, caliber no greater than 6 mm, likely source of splenic infarctions. -Going for spleenic angiography by Vascular surgery on Friday. -Continue with heparin infusion. -Continue with pain management. -Continue to monitor as he is high risk for rupture.  Type 2 diabetes.  Prior A1c in the system was done in 2017 and it was 6.8.  Since then he stopped taking his insulin.  Repeat A1c 11.3 with hyperglycemia. Discussed in detail regarding taking care of his diabetes to prevent complications which  can drastically decreased his quality of life. -Start him on Lantus 15 units at bedtime. -3 Units of NovoLog with meals. -Continue with SSI  Hyperlipemia -lipitor  COPD with chronic bronchitis ([place]): stable -prn albuterol and Mucinex  Tobacco abuse: -Did counseling about importance of quitting smoking -Nicotine patch   Objective: Vitals:   08/04/19 0434 08/04/19 0900 08/04/19 1138 08/04/19 2027  BP: 122/80 117/82 119/71 113/84  Pulse: 91 (!) 103 (!) 110 98  Resp:   15   Temp: 98 F (36.7 C) 98.5 F (36.9 C) 98.2 F (36.8 C) 98.8 F (37.1 C)  TempSrc: Oral Oral Oral Oral  SpO2: 98% 97% 100% 97%  Weight:      Height:        Intake/Output Summary (Last 24 hours) at 08/04/2019 2206 Last data filed at 08/04/2019 2034 Gross per 24 hour  Intake 480 ml  Output 1725 ml  Net -1245 ml   Filed Weights   08/01/19 0124  Weight: 81.2 kg    Examination:  General exam: Appears calm and comfortable  Respiratory system: Clear to auscultation. Respiratory effort normal. Cardiovascular system: S1 & S2 heard, RRR. No JVD, murmurs, rubs, gallops or clicks. Gastrointestinal system: Soft, LUQ tenderness, nondistended, bowel sounds positive. Central nervous system: Alert and oriented. No focal neurological deficits.Symmetric 5 x 5 power. Extremities: No edema, no cyanosis, pulses intact and symmetrical. Skin: No rashes, lesions or ulcers Psychiatry: Judgement and insight appear normal. Mood & affect appropriate.    DVT prophylaxis: Heparin Code Status: Full Family Communication: Discussed with patient Disposition Plan:  Status is: Inpatient  Remains inpatient appropriate because:IV treatments appropriate due to intensity of illness or inability to take PO  Dispo: The patient is from: Home              Anticipated d/c is to: Home              Anticipated d/c date is: 2 days              Patient currently is not medically stable to d/c.  Patient is currently on heparin  infusion for splenic infarct, pending lab work to find a cause.  Pending vascular surgery recommendations.   Consultants:   Vascular surgery.  Procedures:  Antimicrobials:   Data Reviewed: I have personally reviewed following labs and imaging studies  CBC: Recent Labs  Lab 08/01/19 0137 08/02/19 0556 08/03/19 0349 08/04/19 0411  WBC 11.6* 10.4 9.9 10.0  HGB 15.5 14.9 15.0 14.7  HCT 45.9 44.3 44.6 43.3  MCV 89.0 89.3 89.7 87.7  PLT 383 377 404* [id number]*   Basic Metabolic Panel: Recent Labs  Lab 08/01/19 0137 08/02/19 0556  NA 133* 135  K 3.9 4.1  CL 95* 97*  CO2 26 27  GLUCOSE 285* 231*  BUN 17 10  CREATININE 0.81 0.51*  CALCIUM 9.3 9.1   GFR: Estimated Creatinine Clearance: 102.7 mL/min (A) (by C-G formula based on SCr of 0.51 mg/dL (L)). Liver Function Tests: Recent Labs  Lab 08/01/19 0137  AST 17  ALT 18  ALKPHOS 92  BILITOT 0.7  PROT 8.2*  ALBUMIN 3.9   Recent Labs  Lab 08/01/19 0137  LIPASE 20   No results for input(s): AMMONIA in the last 168 hours. Coagulation Profile: Recent Labs  Lab 08/01/19 0610  INR 0.9   Cardiac Enzymes: No results for input(s): CKTOTAL, CKMB, CKMBINDEX, TROPONINI in the last 168 hours. BNP (last 3 results) No results for input(s): PROBNP in the last 8760 hours. HbA1C: No results for input(s): HGBA1C in the last 72 hours. CBG: Recent Labs  Lab 08/03/19 2115 08/04/19 0802 08/04/19 1138 08/04/19 1603 08/04/19 2135  GLUCAP 224* 130* 195* 259* 193*   Lipid Profile: No results for input(s): CHOL, HDL, LDLCALC, TRIG, CHOLHDL, LDLDIRECT in the last 72 hours. Thyroid Function Tests: No results for input(s): TSH, T4TOTAL, FREET4, T3FREE, THYROIDAB in the last 72 hours. Anemia Panel: No results for input(s): VITAMINB12, FOLATE, FERRITIN, TIBC, IRON, RETICCTPCT in the last 72 hours. Sepsis Labs: No results for input(s): PROCALCITON, LATICACIDVEN in the last 168 hours.  Recent Results (from the past 240 hour(s))    SARS Coronavirus 2 by RT PCR (hospital order, performed in [facility] hospital lab) Nasopharyngeal Nasopharyngeal Swab     Status: None   Collection Time: 08/01/19  6:10 AM   Specimen: Nasopharyngeal Swab  Result Value Ref Range Status   SARS Coronavirus 2 NEGATIVE NEGATIVE Final    Comment: (NOTE) SARS-CoV-2 target nucleic acids are NOT DETECTED. The SARS-CoV-2 RNA is generally detectable in upper and lower respiratory specimens during the acute phase of infection. The lowest concentration of SARS-CoV-2 viral copies this assay can detect is 250 copies / mL. A negative result does not preclude SARS-CoV-2 infection and should not be used as the sole basis for treatment or other patient management decisions.  A negative result may occur with improper specimen collection / handling, submission of specimen other than nasopharyngeal swab, presence of viral mutation(s) within the areas targeted by this assay, and inadequate number of viral copies (<250 copies / mL). A negative result must be combined with clinical observations, patient history, and epidemiological information. Fact Sheet for Patients:  StrictlyIdeas.no Fact Sheet for Healthcare Providers: BankingDealers.co.za This test is not yet approved or cleared  by the [place] FDA and has been authorized for detection and/or diagnosis of SARS-CoV-2 by FDA under an Emergency Use Authorization (EUA).  This EUA will remain in effect (meaning this test can be used) for the duration of the COVID-19 declaration under Section 564(b)(1) of the Act, 21 U.S.C. section 360bbb-3(b)(1), unless the authorization is terminated or revoked sooner. Performed at [facility], [address] [place], [place] [address]      Radiology Studies: CT ANGIO ABDOMEN W &/OR WO CONTRAST  Result Date: 08/04/2019 CLINICAL DATA:  Ischemia, abdominal pain, developing splenic infarction on prior CT  EXAM: CT ANGIOGRAPHY CHEST AND ABDOMEN TECHNIQUE: Multidetector CT imaging of the chest and abdomen was performed using the standard protocol during bolus administration of intravenous contrast. Multiplanar CT image reconstructions and MIPs were obtained to evaluate the vascular anatomy. CONTRAST:  38mL OMNIPAQUE IOHEXOL 350 MG/ML SOLN COMPARISON:  CT abdomen pelvis, 08/01/2019 FINDINGS: CTA CHEST FINDINGS Cardiovascular: Preferential opacification of the thoracic aorta. Normal contour and caliber of the thoracic aorta. No evidence of aneurysm, dissection, or other acute aortic pathology. No significant atherosclerosis. Normal heart size. No pericardial effusion. Mediastinum/Nodes: No enlarged mediastinal, hilar, or axillary lymph nodes. Thyroid gland, trachea, and esophagus demonstrate no significant findings. Lungs/Pleura: Small, benign calcified pulmonary nodules of the left lower lobe. 3 mm noncalcified nodule of the lateral left lung base (series 5, image 107). No pleural effusion or pneumothorax. Musculoskeletal: No chest wall abnormality. No acute or significant osseous findings. Review of the MIP images confirms the above findings. CTA ABDOMEN FINDINGS VASCULAR Normal contour and caliber of the abdominal aorta. No evidence of aneurysm, dissection, or other acute aortic pathology. Standard branching pattern of the abdominal aorta with solitary bilateral renal arteries. There is a small dissection and or pseudoaneurysm of the distal splenic artery, caliber no greater than 6 mm (series 7, image 86, series 4, image 102, series 6, image 53) of and mild atherosclerosis of the included common iliac arteries. No significant aortic atherosclerosis Review of the MIP images confirms the above findings. NON-VASCULAR Hepatobiliary: No solid liver abnormality is seen. Hepatic steatosis. No gallstones, gallbladder wall thickening, or biliary dilatation. Pancreas: Unremarkable. No pancreatic ductal dilatation or surrounding  inflammatory changes. Spleen: Redemonstrated infarctions of the spleen. Adrenals/Urinary Tract: Adrenal glands are unremarkable. Kidneys are normal, without renal calculi, solid lesion, or hydronephrosis. Bladder is unremarkable. Stomach/Bowel: Stomach is within normal limits. Appendix appears normal. No evidence of bowel wall thickening, distention, or inflammatory changes. Lymphatic: No enlarged abdominal lymph nodes. Other: No abdominal wall hernia or abnormality. No abdominopelvic ascites. Musculoskeletal: No acute or significant osseous findings. Review of the MIP images confirms the above findings. IMPRESSION: 1. There is a small dissection and/or pseudoaneurysm of the distal splenic artery, caliber no greater than 6 mm, likely source of splenic infarctions. 2. No other significant vascular abnormality in the chest or abdomen. 3. Redemonstrated infarctions of the spleen. 4. Hepatic steatosis. 5. There is a 3 mm noncalcified nodule of the lateral left lung base. No follow-up needed if patient is low-risk. Non-contrast chest CT can be considered in 12 months if patient is high-risk. This recommendation follows the consensus statement: Guidelines for Management of Incidental Pulmonary Nodules Detected on CT Images: From the Fleischner Society 2017; Radiology 2017; 284:228-243. Electronically Signed   By: Eddie Candle M.D.   On: 08/04/2019 11:12   CT ANGIO CHEST AORTA W/CM &/OR WO/CM  Result Date: 08/04/2019 CLINICAL DATA:  Ischemia, abdominal pain, developing splenic infarction on prior CT EXAM: CT ANGIOGRAPHY CHEST AND ABDOMEN TECHNIQUE: Multidetector CT imaging of the chest and abdomen was performed using the standard protocol during bolus administration of intravenous contrast. Multiplanar CT image reconstructions and MIPs were obtained to evaluate the vascular anatomy. CONTRAST:  36mL OMNIPAQUE IOHEXOL 350 MG/ML SOLN COMPARISON:  CT abdomen pelvis, 08/01/2019 FINDINGS: CTA CHEST FINDINGS Cardiovascular:  Preferential opacification of the thoracic aorta. Normal contour and caliber of the thoracic aorta. No evidence of aneurysm, dissection, or other acute aortic pathology. No significant atherosclerosis. Normal heart size. No pericardial effusion. Mediastinum/Nodes: No enlarged mediastinal, hilar, or axillary lymph nodes. Thyroid gland, trachea, and esophagus demonstrate no significant findings. Lungs/Pleura: Small, benign calcified pulmonary nodules of the left lower lobe. 3 mm noncalcified nodule of the lateral left lung base (series 5, image 107). No pleural effusion or pneumothorax. Musculoskeletal: No chest wall abnormality. No acute or significant osseous findings. Review of the MIP images confirms the above findings. CTA ABDOMEN FINDINGS VASCULAR Normal contour and caliber of the abdominal aorta. No evidence of aneurysm, dissection, or other acute aortic pathology. Standard branching pattern of the abdominal aorta with solitary bilateral renal arteries. There is a small dissection and or pseudoaneurysm of the distal splenic artery, caliber no greater than 6 mm (series 7, image 86, series 4, image 102, series 6, image 53) of and mild atherosclerosis of the included common iliac arteries. No significant aortic atherosclerosis Review of the MIP images confirms the above findings. NON-VASCULAR Hepatobiliary: No solid liver abnormality is seen. Hepatic steatosis. No gallstones, gallbladder wall thickening, or biliary dilatation. Pancreas: Unremarkable. No pancreatic ductal dilatation or surrounding inflammatory changes. Spleen: Redemonstrated infarctions of the spleen. Adrenals/Urinary Tract: Adrenal glands are unremarkable. Kidneys are normal, without renal calculi, solid lesion, or hydronephrosis. Bladder is unremarkable. Stomach/Bowel: Stomach is within normal limits. Appendix appears normal. No evidence of bowel wall thickening, distention, or inflammatory changes. Lymphatic: No enlarged abdominal lymph nodes.  Other: No abdominal wall hernia or abnormality. No abdominopelvic ascites. Musculoskeletal: No acute or significant osseous findings. Review of the MIP images confirms the above findings. IMPRESSION: 1. There is a small dissection and/or pseudoaneurysm of the distal splenic artery, caliber no greater than 6 mm, likely source of splenic infarctions. 2. No other significant vascular abnormality in the chest or abdomen. 3. Redemonstrated infarctions of the spleen. 4. Hepatic steatosis. 5. There is a 3 mm noncalcified nodule of the lateral left lung base. No follow-up needed if patient is low-risk. Non-contrast chest CT can be considered in 12 months if patient is high-risk. This recommendation follows the consensus statement: Guidelines for Management of Incidental Pulmonary Nodules Detected on CT Images: From the Fleischner Society 2017; Radiology 2017; 284:228-243. Electronically Signed   By: Eddie Candle M.D.   On: 08/04/2019 11:12    Scheduled Meds: . atorvastatin  40 mg Oral Daily  . dextromethorphan-guaiFENesin  1 tablet Oral BID  . insulin aspart  0-9 Units Subcutaneous TID WC  . insulin aspart  3 Units Subcutaneous TID WC  . insulin glargine  15 Units Subcutaneous QHS  . nicotine  21 mg Transdermal Daily   Continuous Infusions: . heparin 1,550 Units/hr (08/04/19 1907)     LOS: 3 days   Time spent: 40 minutes.  [name], MD [facility]  If 7PM-7AM, please contact night-coverage [email or web address]  08/04/2019, 10:06 PM   This record has been created using Systems analyst. Errors have been sought  and corrected,but may not always be located. Such creation errors do not reflect on the standard of care.

## 2019-08-04 NOTE — Progress Notes (Signed)
[facility]    Plan: Splenic angiogram with Dr. [name] on Friday. Full consult to follow tomorrow AM  Discussed with Dr. Eber Hong [facility] PA-C 08/04/2019 4:46 PM

## 2019-08-04 NOTE — Care Management Important Message (Signed)
Important Message  Patient Details  Name: Rodner Kummer MRN: [id number] Date of Birth: 11/12/1965   Medicare Important Message Given:  Yes     Dannette Barbara 08/04/2019, 11:56 AM

## 2019-08-04 NOTE — Consult Note (Signed)
ANTICOAGULATION CONSULT NOTE -  Pharmacy Consult for Heparin Drip Indication: Splenic Artery Occlusion  Allergies  Allergen Reactions  . Wasp Venom Anaphylaxis  . Coffee Flavor Nausea And Vomiting    Patient states coffee gives him nausea and stomach cramps  . Onion    Patient Measurements: Height: 5\' 4"  (162.6 cm) Weight: 81.2 kg (179 lb) IBW/kg (Calculated) : 59.2 Heparin Dosing Weight: 76.2 kg  Vital Signs: Temp: 98.2 F (36.8 C) (05/26 1138) Temp Source: Oral (05/26 1138) BP: 119/71 (05/26 1138) Pulse Rate: 110 (05/26 1138)  Labs: Recent Labs    08/02/19 0556 08/02/19 1352 08/03/19 0349 08/03/19 0349 08/04/19 0411 08/04/19 1200 08/04/19 1730  HGB 14.9  --  15.0  --  14.7  --   --   HCT 44.3  --  44.6  --  43.3  --   --   PLT 377  --  404*  --  422*  --   --   HEPARINUNFRC 0.25*   < > 0.39   < > 0.29* 0.35 0.40  CREATININE 0.51*  --   --   --   --   --   --    < > = values in this interval not displayed.    Estimated Creatinine Clearance: 102.7 mL/min (A) (by C-G formula based on SCr of 0.51 mg/dL (L)).  Medical History: Past Medical History:  Diagnosis Date  . COPD (chronic obstructive pulmonary disease) ([place])   . Diabetes mellitus without complication ([place])   . Hyperlipemia   . Tobacco abuse     Medications:  Medications Prior to Admission  Medication Sig Dispense Refill Last Dose  . atorvastatin (LIPITOR) 40 MG tablet Take 40 mg by mouth daily.   Not Taking at Unknown time  . diphenhydrAMINE (BENADRYL) 25 MG tablet Take 1 tablet (25 mg total) by mouth every 6 (six) hours as needed. 30 tablet 0   . EPINEPHrine 0.3 mg/0.3 mL IJ SOAJ injection Inject 0.3 mLs (0.3 mg total) into the muscle once. Follow package instructions as needed for severe allergy or anaphylactic reaction. (Patient not taking: Reported on 08/01/2019) 1 Device 2 Not Taking at Unknown time  . metFORMIN (GLUCOPHAGE) 500 MG tablet Take by mouth 2 (two) times daily with a meal.   Not Taking  at Unknown time  . predniSONE (DELTASONE) 20 MG tablet Take 2 tablets (40 mg total) by mouth daily. 8 tablet 0    Scheduled:  . atorvastatin  40 mg Oral Daily  . dextromethorphan-guaiFENesin  1 tablet Oral BID  . insulin aspart  0-9 Units Subcutaneous TID WC  . insulin aspart  3 Units Subcutaneous TID WC  . insulin glargine  15 Units Subcutaneous QHS  . nicotine  21 mg Transdermal Daily   Infusions:  . heparin 1,550 Units/hr (08/04/19 0532)   PRN: acetaminophen, albuterol, iohexol, morphine injection, ondansetron (ZOFRAN) IV, oxyCODONE-acetaminophen, senna-docusate Anti-infectives (From admission, onward)   None     Assessment: Pharmacy has been consulted to initiate heparin infusion in 54yo patient complaining of left upper quadrant abdominal pain x 4 days. CT of abdomen shows developing splenic infarcts, likely secondary to splenic artery occlusion. Splenic vein is patent. Patient with no history of PTA anticoagulant use. Baseline labs: Hgb 15.5, Plts 383, aPTT/INR pending. Will initiate heparin drip immediately.  Given 4500 units bolus and heparin infusion started at 1300 units/hr  5/24 2034 HL 0.4  0525 0349 HL 0.39, CBC stable 0526 0411 HL 0.29, subtherapeutic x 1.  CBC  stable.  0526 1200 HL 0.35 0526 1730 HL 0.40  Goal of Therapy:  Heparin level 0.3-0.7 units/ml Monitor platelets by anticoagulation protocol: Yes   Plan:  Heparin level is therapeutic x2.  - Will continue heparin drip @ 1550 units/hr. Will recheck HL with AM labs and CBC daily.   Continue to monitor H&H and platelets  Pearla Dubonnet, PharmD Clinical Pharmacist 08/04/2019 5:54 PM

## 2019-08-05 ENCOUNTER — Other Ambulatory Visit (INDEPENDENT_AMBULATORY_CARE_PROVIDER_SITE_OTHER): Payer: Self-pay | Admitting: Vascular Surgery

## 2019-08-05 DIAGNOSIS — I748 Embolism and thrombosis of other arteries: Principal | ICD-10-CM

## 2019-08-05 DIAGNOSIS — I998 Other disorder of circulatory system: Secondary | ICD-10-CM

## 2019-08-05 LAB — CBC
HCT: 42.8 % (ref 39.0–52.0)
Hemoglobin: 14 g/dL (ref 13.0–17.0)
MCH: 29.4 pg (ref 26.0–34.0)
MCHC: 32.7 g/dL (ref 30.0–36.0)
MCV: 89.9 fL (ref 80.0–100.0)
Platelets: 453 10*3/uL — ABNORMAL HIGH (ref 150–400)
RBC: 4.76 MIL/uL (ref 4.22–5.81)
RDW: 12 % (ref 11.5–15.5)
WBC: 9.8 10*3/uL (ref 4.0–10.5)
nRBC: 0 % (ref 0.0–0.2)

## 2019-08-05 LAB — GLUCOSE, CAPILLARY
Glucose-Capillary: 172 mg/dL — ABNORMAL HIGH (ref 70–99)
Glucose-Capillary: 175 mg/dL — ABNORMAL HIGH (ref 70–99)
Glucose-Capillary: 142 mg/dL — ABNORMAL HIGH (ref 70–99)
Glucose-Capillary: 330 mg/dL — ABNORMAL HIGH (ref 70–99)

## 2019-08-05 LAB — HEPARIN LEVEL (UNFRACTIONATED): Heparin Unfractionated: 0.51 IU/mL (ref 0.30–0.70)

## 2019-08-05 LAB — FACTOR 5 LEIDEN

## 2019-08-05 LAB — PROTHROMBIN GENE MUTATION

## 2019-08-05 MED ORDER — SODIUM CHLORIDE 0.9 % IV SOLN: INTRAVENOUS | Status: AC

## 2019-08-05 NOTE — Consult Note (Signed)
ANTICOAGULATION CONSULT NOTE -  Pharmacy Consult for Heparin Drip Indication: Splenic Artery Occlusion  Allergies  Allergen Reactions  . Wasp Venom Anaphylaxis  . Coffee Flavor Nausea And Vomiting    Patient states coffee gives him nausea and stomach cramps  . Onion    Patient Measurements: Height: 5\' 4"  (162.6 cm) Weight: 81.2 kg (179 lb) IBW/kg (Calculated) : 59.2 Heparin Dosing Weight: 76.2 kg  Vital Signs: Temp: 97.3 F (36.3 C) (05/27 0506) Temp Source: Oral (05/27 0506) BP: 113/78 (05/27 0506) Pulse Rate: 84 (05/27 0506)  Labs: Recent Labs    08/03/19 0349 08/03/19 0349 08/04/19 0411 08/04/19 0411 08/04/19 1200 08/04/19 1730 08/05/19 0410  HGB 15.0   < > 14.7  --   --   --  14.0  HCT 44.6  --  43.3  --   --   --  42.8  PLT 404*  --  422*  --   --   --  453*  HEPARINUNFRC 0.39   < > 0.29*   < > 0.35 0.40 0.51   < > = values in this interval not displayed.    Estimated Creatinine Clearance: 102.7 mL/min (A) (by C-G formula based on SCr of 0.51 mg/dL (L)).  Medical History: Past Medical History:  Diagnosis Date  . COPD (chronic obstructive pulmonary disease) ([place])   . Diabetes mellitus without complication ([place])   . Hyperlipemia   . Tobacco abuse     Medications:  Medications Prior to Admission  Medication Sig Dispense Refill Last Dose  . atorvastatin (LIPITOR) 40 MG tablet Take 40 mg by mouth daily.   Not Taking at Unknown time  . diphenhydrAMINE (BENADRYL) 25 MG tablet Take 1 tablet (25 mg total) by mouth every 6 (six) hours as needed. 30 tablet 0   . EPINEPHrine 0.3 mg/0.3 mL IJ SOAJ injection Inject 0.3 mLs (0.3 mg total) into the muscle once. Follow package instructions as needed for severe allergy or anaphylactic reaction. (Patient not taking: Reported on 08/01/2019) 1 Device 2 Not Taking at Unknown time  . metFORMIN (GLUCOPHAGE) 500 MG tablet Take by mouth 2 (two) times daily with a meal.   Not Taking at Unknown time  . predniSONE (DELTASONE) 20 MG  tablet Take 2 tablets (40 mg total) by mouth daily. 8 tablet 0    Scheduled:  . atorvastatin  40 mg Oral Daily  . dextromethorphan-guaiFENesin  1 tablet Oral BID  . insulin aspart  0-9 Units Subcutaneous TID WC  . insulin aspart  3 Units Subcutaneous TID WC  . insulin glargine  15 Units Subcutaneous QHS  . nicotine  21 mg Transdermal Daily   Infusions:  . heparin 1,550 Units/hr (08/04/19 1907)   PRN: acetaminophen, albuterol, iohexol, morphine injection, ondansetron (ZOFRAN) IV, oxyCODONE-acetaminophen, senna-docusate Anti-infectives (From admission, onward)   None     Assessment: Pharmacy has been consulted to initiate heparin infusion in 53yo patient complaining of left upper quadrant abdominal pain x 4 days. CT of abdomen shows developing splenic infarcts, likely secondary to splenic artery occlusion. Splenic vein is patent. Patient with no history of PTA anticoagulant use. Baseline labs: Hgb 15.5, Plts 383, aPTT/INR pending. Will initiate heparin drip immediately.  Given 4500 units bolus and heparin infusion started at 1300 units/hr  5/24 2034 HL 0.4  0525 0349 HL 0.39, CBC stable 0526 0411 HL 0.29, subtherapeutic x 1.  CBC stable.  0526 1200 HL 0.35 0526 1730 HL 0.40 0527 0410 HL 0.51, therapeutic, CBC stable  Goal of  Therapy:  Heparin level 0.3-0.7 units/ml Monitor platelets by anticoagulation protocol: Yes   Plan:  Heparin level is therapeutic x 3.  - Will continue heparin drip @ 1550 units/hr. Will recheck HL with AM labs and CBC daily.   Continue to monitor H&H and platelets  Ena Dawley, PharmD Clinical Pharmacist 08/05/2019 6:46 AM

## 2019-08-05 NOTE — Progress Notes (Signed)
PROGRESS NOTE    Benjamin Conley  [id number] DOB: May 02, 1965 DOA: 08/01/2019 PCP: Charlotte Sanes, MD   Brief Narrative:  Benjamin Conley is a 54 y.o. male with medical history significant of hyperlipidemia, diabetes mellitus, tobacco abuse, COPD, who presents with abdominal pain. CT abdomen/pelvis that showed developing splenic infarcts, likely secondary to splenic artery occlusion, and splenic vein is patent. Pt is admitted to [place] bed as inpatient by accepting MD.  ED physician discussed with vascular surgeon, Dr. Teola Bradley.  And they advise starting him on heparin infusion.  Subjective: Patient has no new complaints today.  Assessment & Plan:   Principal Problem:   Splenic infarct Active Problems:   Diabetes mellitus without complication (HCC)   Hyperlipemia   COPD with chronic bronchitis (HCC)   Tobacco abuse  Splenic infarct: EDP discussed with vascular surgeon, Dr. Teola Bradley, "who recommended putting patient on heparin and admitted to the hospitalist for close monitoring for possibility of splenic rupture and for evaluation of possible etiology of splenic artery occlusion". -Hypercoagulable labs so far are normal homocystine, protein C and S, borderline elevation of Antithrombin III function, dRVVT Mix mildly elevated,rest of the labs are pending. CTA of chest and abdomen with a small dissection and/or pseudoaneurysm of the distal splenic artery, caliber no greater than 6 mm, likely source of splenic infarctions. -Going for spleenic angiography by Vascular surgery tomorrow. -Continue with heparin infusion. -Continue with pain management. -Continue to monitor as he is high risk for rupture.  Type 2 diabetes.  Prior A1c in the system was done in 2017 and it was 6.8.  Since then he stopped taking his insulin.  Repeat A1c 11.3 with hyperglycemia. Discussed in detail regarding taking care of his diabetes to prevent complications which can drastically decreased his quality of  life. -Continue Lantus 15 units at bedtime. -3 Units of NovoLog with meals. -Continue with SSI  Hyperlipemia -lipitor  COPD with chronic bronchitis ([place]): stable -prn albuterol and Mucinex  Tobacco abuse: -Did counseling about importance of quitting smoking -Nicotine patch   Objective: Vitals:   08/04/19 1138 08/04/19 2027 08/05/19 0506 08/05/19 1215  BP: 119/71 113/84 113/78 119/85  Pulse: (!) 110 98 84 92  Resp: 15  18   Temp: 98.2 F (36.8 C) 98.8 F (37.1 C) (!) 97.3 F (36.3 C) (!) 97.3 F (36.3 C)  TempSrc: Oral Oral Oral Oral  SpO2: 100% 97% 99% 99%  Weight:      Height:        Intake/Output Summary (Last 24 hours) at 08/05/2019 1910 Last data filed at 08/05/2019 1733 Gross per 24 hour  Intake 240 ml  Output 2225 ml  Net -1985 ml   Filed Weights   08/01/19 0124  Weight: 81.2 kg    Examination:  General exam: Appears calm and comfortable  Respiratory system: Clear to auscultation. Respiratory effort normal. Cardiovascular system: S1 & S2 heard, RRR. No JVD, murmurs, rubs, gallops or clicks. Gastrointestinal system: Soft, LUQ tenderness, nondistended, bowel sounds positive. Central nervous system: Alert and oriented. No focal neurological deficits.Symmetric 5 x 5 power. Extremities: No edema, no cyanosis, pulses intact and symmetrical. Skin: No rashes, lesions or ulcers Psychiatry: Judgement and insight appear normal. Mood & affect appropriate.    DVT prophylaxis: Heparin Code Status: Full Family Communication: Discussed with patient Disposition Plan:  Status is: Inpatient  Remains inpatient appropriate because:IV treatments appropriate due to intensity of illness or inability to take PO   Dispo: The patient is from: Home  Anticipated d/c is to: Home              Anticipated d/c date is: 2 days              Patient currently is not medically stable to d/c.  Patient is currently on heparin infusion for splenic infarct, pending lab  work to find a cause.  Going for splenic angiography tomorrow.  Consultants:   Vascular surgery.  Procedures:  Antimicrobials:   Data Reviewed: I have personally reviewed following labs and imaging studies  CBC: Recent Labs  Lab 08/01/19 0137 08/02/19 0556 08/03/19 0349 08/04/19 0411 08/05/19 0410  WBC 11.6* 10.4 9.9 10.0 9.8  HGB 15.5 14.9 15.0 14.7 14.0  HCT 45.9 44.3 44.6 43.3 42.8  MCV 89.0 89.3 89.7 87.7 89.9  PLT 383 377 404* 422* 0000000*   Basic Metabolic Panel: Recent Labs  Lab 08/01/19 0137 08/02/19 0556  NA 133* 135  K 3.9 4.1  CL 95* 97*  CO2 26 27  GLUCOSE 285* 231*  BUN 17 10  CREATININE 0.81 0.51*  CALCIUM 9.3 9.1   GFR: Estimated Creatinine Clearance: 102.7 mL/min (A) (by C-G formula based on SCr of 0.51 mg/dL (L)). Liver Function Tests: Recent Labs  Lab 08/01/19 0137  AST 17  ALT 18  ALKPHOS 92  BILITOT 0.7  PROT 8.2*  ALBUMIN 3.9   Recent Labs  Lab 08/01/19 0137  LIPASE 20   No results for input(s): AMMONIA in the last 168 hours. Coagulation Profile: Recent Labs  Lab 08/01/19 0610  INR 0.9   Cardiac Enzymes: No results for input(s): CKTOTAL, CKMB, CKMBINDEX, TROPONINI in the last 168 hours. BNP (last 3 results) No results for input(s): PROBNP in the last 8760 hours. HbA1C: No results for input(s): HGBA1C in the last 72 hours. CBG: Recent Labs  Lab 08/04/19 1603 08/04/19 2135 08/05/19 0755 08/05/19 1213 08/05/19 1641  GLUCAP 259* 193* 172* 175* 142*   Lipid Profile: No results for input(s): CHOL, HDL, LDLCALC, TRIG, CHOLHDL, LDLDIRECT in the last 72 hours. Thyroid Function Tests: No results for input(s): TSH, T4TOTAL, FREET4, T3FREE, THYROIDAB in the last 72 hours. Anemia Panel: No results for input(s): VITAMINB12, FOLATE, FERRITIN, TIBC, IRON, RETICCTPCT in the last 72 hours. Sepsis Labs: No results for input(s): PROCALCITON, LATICACIDVEN in the last 168 hours.  Recent Results (from the past 240 hour(s))  SARS  Coronavirus 2 by RT PCR (hospital order, performed in [facility] hospital lab) Nasopharyngeal Nasopharyngeal Swab     Status: None   Collection Time: 08/01/19  6:10 AM   Specimen: Nasopharyngeal Swab  Result Value Ref Range Status   SARS Coronavirus 2 NEGATIVE NEGATIVE Final    Comment: (NOTE) SARS-CoV-2 target nucleic acids are NOT DETECTED. The SARS-CoV-2 RNA is generally detectable in upper and lower respiratory specimens during the acute phase of infection. The lowest concentration of SARS-CoV-2 viral copies this assay can detect is 250 copies / mL. A negative result does not preclude SARS-CoV-2 infection and should not be used as the sole basis for treatment or other patient management decisions.  A negative result may occur with improper specimen collection / handling, submission of specimen other than nasopharyngeal swab, presence of viral mutation(s) within the areas targeted by this assay, and inadequate number of viral copies (<250 copies / mL). A negative result must be combined with clinical observations, patient history, and epidemiological information. Fact Sheet for Patients:   StrictlyIdeas.no Fact Sheet for Healthcare Providers: BankingDealers.co.za This test is not yet  approved or cleared  by the [place] and has been authorized for detection and/or diagnosis of SARS-CoV-2 by FDA under an Emergency Use Authorization (EUA).  This EUA will remain in effect (meaning this test can be used) for the duration of the COVID-19 declaration under Section 564(b)(1) of the Act, 21 U.S.C. section 360bbb-3(b)(1), unless the authorization is terminated or revoked sooner. Performed at [facility], [address] [place], [place] [address]      Radiology Studies: CT ANGIO ABDOMEN W &/OR WO CONTRAST  Result Date: 08/04/2019 CLINICAL DATA:  Ischemia, abdominal pain, developing splenic infarction on prior CT EXAM:  CT ANGIOGRAPHY CHEST AND ABDOMEN TECHNIQUE: Multidetector CT imaging of the chest and abdomen was performed using the standard protocol during bolus administration of intravenous contrast. Multiplanar CT image reconstructions and MIPs were obtained to evaluate the vascular anatomy. CONTRAST:  69mL OMNIPAQUE IOHEXOL 350 MG/ML SOLN COMPARISON:  CT abdomen pelvis, 08/01/2019 FINDINGS: CTA CHEST FINDINGS Cardiovascular: Preferential opacification of the thoracic aorta. Normal contour and caliber of the thoracic aorta. No evidence of aneurysm, dissection, or other acute aortic pathology. No significant atherosclerosis. Normal heart size. No pericardial effusion. Mediastinum/Nodes: No enlarged mediastinal, hilar, or axillary lymph nodes. Thyroid gland, trachea, and esophagus demonstrate no significant findings. Lungs/Pleura: Small, benign calcified pulmonary nodules of the left lower lobe. 3 mm noncalcified nodule of the lateral left lung base (series 5, image 107). No pleural effusion or pneumothorax. Musculoskeletal: No chest wall abnormality. No acute or significant osseous findings. Review of the MIP images confirms the above findings. CTA ABDOMEN FINDINGS VASCULAR Normal contour and caliber of the abdominal aorta. No evidence of aneurysm, dissection, or other acute aortic pathology. Standard branching pattern of the abdominal aorta with solitary bilateral renal arteries. There is a small dissection and or pseudoaneurysm of the distal splenic artery, caliber no greater than 6 mm (series 7, image 86, series 4, image 102, series 6, image 53) of and mild atherosclerosis of the included common iliac arteries. No significant aortic atherosclerosis Review of the MIP images confirms the above findings. NON-VASCULAR Hepatobiliary: No solid liver abnormality is seen. Hepatic steatosis. No gallstones, gallbladder wall thickening, or biliary dilatation. Pancreas: Unremarkable. No pancreatic ductal dilatation or surrounding  inflammatory changes. Spleen: Redemonstrated infarctions of the spleen. Adrenals/Urinary Tract: Adrenal glands are unremarkable. Kidneys are normal, without renal calculi, solid lesion, or hydronephrosis. Bladder is unremarkable. Stomach/Bowel: Stomach is within normal limits. Appendix appears normal. No evidence of bowel wall thickening, distention, or inflammatory changes. Lymphatic: No enlarged abdominal lymph nodes. Other: No abdominal wall hernia or abnormality. No abdominopelvic ascites. Musculoskeletal: No acute or significant osseous findings. Review of the MIP images confirms the above findings. IMPRESSION: 1. There is a small dissection and/or pseudoaneurysm of the distal splenic artery, caliber no greater than 6 mm, likely source of splenic infarctions. 2. No other significant vascular abnormality in the chest or abdomen. 3. Redemonstrated infarctions of the spleen. 4. Hepatic steatosis. 5. There is a 3 mm noncalcified nodule of the lateral left lung base. No follow-up needed if patient is low-risk. Non-contrast chest CT can be considered in 12 months if patient is high-risk. This recommendation follows the consensus statement: Guidelines for Management of Incidental Pulmonary Nodules Detected on CT Images: From the Fleischner Society 2017; Radiology 2017; 284:228-243. Electronically Signed   By: Eddie Candle M.D.   On: 08/04/2019 11:12   CT ANGIO CHEST AORTA W/CM &/OR WO/CM  Result Date: 08/04/2019 CLINICAL DATA:  Ischemia, abdominal pain, developing splenic  infarction on prior CT EXAM: CT ANGIOGRAPHY CHEST AND ABDOMEN TECHNIQUE: Multidetector CT imaging of the chest and abdomen was performed using the standard protocol during bolus administration of intravenous contrast. Multiplanar CT image reconstructions and MIPs were obtained to evaluate the vascular anatomy. CONTRAST:  63mL OMNIPAQUE IOHEXOL 350 MG/ML SOLN COMPARISON:  CT abdomen pelvis, 08/01/2019 FINDINGS: CTA CHEST FINDINGS Cardiovascular:  Preferential opacification of the thoracic aorta. Normal contour and caliber of the thoracic aorta. No evidence of aneurysm, dissection, or other acute aortic pathology. No significant atherosclerosis. Normal heart size. No pericardial effusion. Mediastinum/Nodes: No enlarged mediastinal, hilar, or axillary lymph nodes. Thyroid gland, trachea, and esophagus demonstrate no significant findings. Lungs/Pleura: Small, benign calcified pulmonary nodules of the left lower lobe. 3 mm noncalcified nodule of the lateral left lung base (series 5, image 107). No pleural effusion or pneumothorax. Musculoskeletal: No chest wall abnormality. No acute or significant osseous findings. Review of the MIP images confirms the above findings. CTA ABDOMEN FINDINGS VASCULAR Normal contour and caliber of the abdominal aorta. No evidence of aneurysm, dissection, or other acute aortic pathology. Standard branching pattern of the abdominal aorta with solitary bilateral renal arteries. There is a small dissection and or pseudoaneurysm of the distal splenic artery, caliber no greater than 6 mm (series 7, image 86, series 4, image 102, series 6, image 53) of and mild atherosclerosis of the included common iliac arteries. No significant aortic atherosclerosis Review of the MIP images confirms the above findings. NON-VASCULAR Hepatobiliary: No solid liver abnormality is seen. Hepatic steatosis. No gallstones, gallbladder wall thickening, or biliary dilatation. Pancreas: Unremarkable. No pancreatic ductal dilatation or surrounding inflammatory changes. Spleen: Redemonstrated infarctions of the spleen. Adrenals/Urinary Tract: Adrenal glands are unremarkable. Kidneys are normal, without renal calculi, solid lesion, or hydronephrosis. Bladder is unremarkable. Stomach/Bowel: Stomach is within normal limits. Appendix appears normal. No evidence of bowel wall thickening, distention, or inflammatory changes. Lymphatic: No enlarged abdominal lymph nodes.  Other: No abdominal wall hernia or abnormality. No abdominopelvic ascites. Musculoskeletal: No acute or significant osseous findings. Review of the MIP images confirms the above findings. IMPRESSION: 1. There is a small dissection and/or pseudoaneurysm of the distal splenic artery, caliber no greater than 6 mm, likely source of splenic infarctions. 2. No other significant vascular abnormality in the chest or abdomen. 3. Redemonstrated infarctions of the spleen. 4. Hepatic steatosis. 5. There is a 3 mm noncalcified nodule of the lateral left lung base. No follow-up needed if patient is low-risk. Non-contrast chest CT can be considered in 12 months if patient is high-risk. This recommendation follows the consensus statement: Guidelines for Management of Incidental Pulmonary Nodules Detected on CT Images: From the Fleischner Society 2017; Radiology 2017; 284:228-243. Electronically Signed   By: Eddie Candle M.D.   On: 08/04/2019 11:12    Scheduled Meds: . atorvastatin  40 mg Oral Daily  . dextromethorphan-guaiFENesin  1 tablet Oral BID  . insulin aspart  0-9 Units Subcutaneous TID WC  . insulin aspart  3 Units Subcutaneous TID WC  . insulin glargine  15 Units Subcutaneous QHS  . nicotine  21 mg Transdermal Daily   Continuous Infusions: . [START ON 08/06/2019] sodium chloride    . heparin 1,550 Units/hr (08/05/19 1122)     LOS: 4 days   Time spent: 40 minutes.  [name], MD [facility]  If 7PM-7AM, please contact night-coverage [email or web address]  08/05/2019, 7:10 PM   This record has been created using Systems analyst. Errors have been sought and corrected,but  may not always be located. Such creation errors do not reflect on the standard of care.

## 2019-08-05 NOTE — Progress Notes (Signed)
Inpatient Diabetes Program Recommendations  AACE/ADA: New Consensus Statement on Inpatient Glycemic Control   Target Ranges:  Prepandial:   less than 140 mg/dL      Peak postprandial:   less than 180 mg/dL (1-2 hours)      Critically ill patients:  140 - 180 mg/dL   Results for CLAIBORN, MCCALIP (MRN [id number]) as of 08/05/2019 08:04  Ref. Range 08/04/2019 08:02 08/04/2019 11:38 08/04/2019 16:03 08/04/2019 21:35  Glucose-Capillary Latest Ref Range: 70 - 99 mg/dL 130 (H) 195 (H) 259 (H) 193 (H)   Review of Glycemic Control  Diabetes history: DM2 Outpatient Diabetes medications: Metformin 500 mg BID Current orders for Inpatient glycemic control: Lantus 15 units QHS, Novolog 3 units TID with meals for meal coverage, Novolog 0-9 units TID with meals  Inpatient Diabetes Program Recommendations:   Insulin - Meal Coverage: Please consider increasing meal coverage to Novolog 5 units TID with meals if patient eats at least 50% of meals.  Thanks, Barnie Alderman, RN, MSN, CDE Diabetes Coordinator Inpatient Diabetes Program [phone] (Team Pager from 8am to 5pm)

## 2019-08-05 NOTE — Consult Note (Signed)
[name] Consult Note  MRN : [id number]  Benjamin Conley is a 54 y.o. (24-Mar-1965) male who presents with chief complaint of  Chief Complaint  Patient presents with  . Abdominal Pain   History of Present Illness:  The patient is a 54 year old male with past medical history of uncontrolled diabetes type 2, hyperlipidemia, COPD with chronic bronchitis, active tobacco abuse who presented to the [facility] emergency department with chief complaint of left upper quadrant pain.  Patient endorses a history of progressively worsening left upper quadrant pain describing the pain is severe and constant.  Denies any nausea, vomiting or diarrhea.  Denies any shortness of breath or chest pain.  08/04/19 CT A/P: 1. There is a small dissection and/or pseudoaneurysm of the distal splenic artery, caliber no greater than 6 mm, likely source of splenic infarctions. 2. No other significant vascular abnormality in the chest or abdomen. 3. Redemonstrated infarctions of the spleen. 4. Hepatic steatosis. 5. There is a 3 mm noncalcified nodule of the lateral left lung base.   Vascular surgery was consulted by Dr. Reesa Chew for possible vascular intervention.  Current Facility-Administered Medications  Medication Dose Route Frequency Provider Last Rate Last Admin  . [START ON 08/06/2019] 0.9 %  sodium chloride infusion   Intravenous Continuous Sacoya Mcgourty A, PA-C      . acetaminophen (TYLENOL) tablet 650 mg  650 mg Oral Q6H PRN Ivor Costa, MD      . albuterol (PROVENTIL) (2.5 MG/3ML) 0.083% nebulizer solution 3 mL  3 mL Inhalation Q4H PRN Ivor Costa, MD      . atorvastatin (LIPITOR) tablet 40 mg  40 mg Oral Daily Ivor Costa, MD   40 mg at 08/03/19 1009  . dextromethorphan-guaiFENesin (MUCINEX DM) 30-600 MG per 12 hr tablet 1 tablet  1 tablet Oral BID Ivor Costa, MD   1 tablet at 08/05/19 0819  . heparin ADULT infusion 100 units/mL (25000  units/271mL sodium chloride 0.45%)  1,550 Units/hr Intravenous Continuous Hart Robinsons A, RPH 15.5 mL/hr at 08/05/19 1122 1,550 Units/hr at 08/05/19 1122  . insulin aspart (novoLOG) injection 0-9 Units  0-9 Units Subcutaneous TID WC Ivor Costa, MD   2 Units at 08/05/19 0820  . insulin aspart (novoLOG) injection 3 Units  3 Units Subcutaneous TID WC [name], MD   3 Units at 08/05/19 0820  . insulin glargine (LANTUS) injection 15 Units  15 Units Subcutaneous QHS [name], MD   15 Units at 08/04/19 2225  . iohexol (OMNIPAQUE) 9 MG/ML oral solution 500 mL  500 mL Oral BID PRN Paulette Blanch, MD      . morphine 2 MG/ML injection 2 mg  2 mg Intravenous Q3H PRN Ivor Costa, MD      . nicotine (NICODERM CQ - dosed in mg/24 hours) patch 21 mg  21 mg Transdermal Daily Ivor Costa, MD   21 mg at 08/05/19 0820  . ondansetron (ZOFRAN) injection 4 mg  4 mg Intravenous Q8H PRN Ivor Costa, MD      . oxyCODONE-acetaminophen (PERCOCET/ROXICET) 5-325 MG per tablet 1 tablet  1 tablet Oral Q4H PRN Ivor Costa, MD   1 tablet at 08/04/19 2231  . senna-docusate (Senokot-S) tablet 1 tablet  1 tablet Oral QHS PRN Ivor Costa, MD       Past Medical History:  Diagnosis Date  . COPD (chronic obstructive pulmonary disease) ([place])   . Diabetes mellitus without complication ([place])   . Hyperlipemia   .  Tobacco abuse    Past Surgical History:  Procedure Laterality Date  . BACK SURGERY     lumbar   Social History Social History   Tobacco Use  . Smoking status: Current Every Day Smoker    Packs/day: 1.00    Types: Cigarettes  . Smokeless tobacco: Never Used  Substance Use Topics  . Alcohol use: No  . Drug use: No   Family History Family History  Problem Relation Age of Onset  . Diabetes Mellitus II Mother   . Diabetes Mellitus II Brother   . Hyperlipidemia Brother   Denies family history of peripheral artery disease, venous disease or renal disease.  Allergies  Allergen Reactions  . Wasp Venom Anaphylaxis   . Coffee Flavor Nausea And Vomiting    Patient states coffee gives him nausea and stomach cramps  . Onion    REVIEW OF SYSTEMS (Negative unless checked)  Constitutional: [] Weight loss  [] Fever  [] Chills Cardiac: [] Chest pain   [] Chest pressure   [] Palpitations   [] Shortness of breath when laying flat   [] Shortness of breath at rest   [] Shortness of breath with exertion. Vascular:  [] Pain in legs with walking   [] Pain in legs at rest   [] Pain in legs when laying flat   [] Claudication   [] Pain in feet when walking  [] Pain in feet at rest  [] Pain in feet when laying flat   [] History of DVT   [] Phlebitis   [] Swelling in legs   [] Varicose veins   [] Non-healing ulcers Pulmonary:   [] Uses home oxygen   [] Productive cough   [] Hemoptysis   [] Wheeze  [] COPD   [] Asthma Neurologic:  [] Dizziness  [] Blackouts   [] Seizures   [] History of stroke   [] History of TIA  [] Aphasia   [] Temporary blindness   [] Dysphagia   [] Weakness or numbness in arms   [] Weakness or numbness in legs Musculoskeletal:  [] Arthritis   [] Joint swelling   [] Joint pain   [] Low back pain Hematologic:  [] Easy bruising  [] Easy bleeding   [] Hypercoagulable state   [] Anemic  [] Hepatitis Gastrointestinal:  [] Blood in stool   [] Vomiting blood  [] Gastroesophageal reflux/heartburn   [] Difficulty swallowing. Genitourinary:  [] Chronic kidney disease   [] Difficult urination  [] Frequent urination  [] Burning with urination   [] Blood in urine Skin:  [] Rashes   [] Ulcers   [] Wounds Psychological:  [] History of anxiety   []  History of major depression.  Positive for left upper extremity pain  Physical Examination  Vitals:   08/04/19 1138 08/04/19 2027 08/05/19 0506 08/05/19 1215  BP: 119/71 113/84 113/78 119/85  Pulse: (!) 110 98 84 92  Resp: 15  18   Temp: 98.2 F (36.8 C) 98.8 F (37.1 C) (!) 97.3 F (36.3 C) (!) 97.3 F (36.3 C)  TempSrc: Oral Oral Oral Oral  SpO2: 100% 97% 99% 99%  Weight:      Height:       Body mass index is 30.73  kg/m. Gen:  WD/WN, NAD Head: [place]/AT, No temporalis wasting. Prominent temp pulse not noted. Ear/Nose/Throat: Hearing grossly intact, nares w/o erythema or drainage, oropharynx w/o Erythema/Exudate Eyes: Sclera non-icteric, conjunctiva clear Neck: Trachea midline.  No JVD.  Pulmonary:  Good air movement, respirations not labored, equal bilaterally.  Cardiac: RRR, normal S1, S2. Vascular:  Vessel Right Left  Radial Palpable Palpable  Ulnar Palpable Palpable  Brachial Palpable Palpable  Carotid Palpable, without bruit Palpable, without bruit  Aorta Not palpable N/A  Femoral Palpable Palpable  Popliteal Palpable Palpable  PT Palpable Palpable  DP Palpable Palpable   Gastrointestinal: soft, non-tender/non-distended. No guarding/reflex.  Musculoskeletal: M/S 5/5 throughout.  Extremities without ischemic changes.  No deformity or atrophy. No edema. Neurologic: Sensation grossly intact in extremities.  Symmetrical.  Speech is fluent. Motor exam as listed above. Psychiatric: Judgment intact, Mood & affect appropriate for pt's clinical situation. Dermatologic: No rashes or ulcers noted.  No cellulitis or open wounds. Lymph : No Cervical, Axillary, or Inguinal lymphadenopathy.  CBC Lab Results  Component Value Date   WBC 9.8 08/05/2019   HGB 14.0 08/05/2019   HCT 42.8 08/05/2019   MCV 89.9 08/05/2019   PLT 453 (H) 08/05/2019   BMET    Component Value Date/Time   NA 135 08/02/2019 0556   NA 137 08/30/2013 2322   K 4.1 08/02/2019 0556   K 3.5 08/30/2013 2322   CL 97 (L) 08/02/2019 0556   CL 101 08/30/2013 2322   CO2 27 08/02/2019 0556   CO2 23 08/30/2013 2322   GLUCOSE 231 (H) 08/02/2019 0556   GLUCOSE 374 (H) 08/30/2013 2322   BUN 10 08/02/2019 0556   BUN 13 08/30/2013 2322   CREATININE 0.51 (L) 08/02/2019 0556   CREATININE 0.69 08/30/2013 2322   CALCIUM 9.1 08/02/2019 0556   CALCIUM 9.1 08/30/2013 2322   GFRNONAA >60 08/02/2019 0556   GFRNONAA >60 08/30/2013 2322   GFRAA  >60 08/02/2019 0556   GFRAA >60 08/30/2013 2322   Estimated Creatinine Clearance: 102.7 mL/min (A) (by C-G formula based on SCr of 0.51 mg/dL (L)).  COAG Lab Results  Component Value Date   INR 0.9 08/01/2019   Radiology CT ANGIO ABDOMEN W &/OR WO CONTRAST  Result Date: 08/04/2019 CLINICAL DATA:  Ischemia, abdominal pain, developing splenic infarction on prior CT EXAM: CT ANGIOGRAPHY CHEST AND ABDOMEN TECHNIQUE: Multidetector CT imaging of the chest and abdomen was performed using the standard protocol during bolus administration of intravenous contrast. Multiplanar CT image reconstructions and MIPs were obtained to evaluate the vascular anatomy. CONTRAST:  77mL OMNIPAQUE IOHEXOL 350 MG/ML SOLN COMPARISON:  CT abdomen pelvis, 08/01/2019 FINDINGS: CTA CHEST FINDINGS Cardiovascular: Preferential opacification of the thoracic aorta. Normal contour and caliber of the thoracic aorta. No evidence of aneurysm, dissection, or other acute aortic pathology. No significant atherosclerosis. Normal heart size. No pericardial effusion. Mediastinum/Nodes: No enlarged mediastinal, hilar, or axillary lymph nodes. Thyroid gland, trachea, and esophagus demonstrate no significant findings. Lungs/Pleura: Small, benign calcified pulmonary nodules of the left lower lobe. 3 mm noncalcified nodule of the lateral left lung base (series 5, image 107). No pleural effusion or pneumothorax. Musculoskeletal: No chest wall abnormality. No acute or significant osseous findings. Review of the MIP images confirms the above findings. CTA ABDOMEN FINDINGS VASCULAR Normal contour and caliber of the abdominal aorta. No evidence of aneurysm, dissection, or other acute aortic pathology. Standard branching pattern of the abdominal aorta with solitary bilateral renal arteries. There is a small dissection and or pseudoaneurysm of the distal splenic artery, caliber no greater than 6 mm (series 7, image 86, series 4, image 102, series 6, image 53)  of and mild atherosclerosis of the included common iliac arteries. No significant aortic atherosclerosis Review of the MIP images confirms the above findings. NON-VASCULAR Hepatobiliary: No solid liver abnormality is seen. Hepatic steatosis. No gallstones, gallbladder wall thickening, or biliary dilatation. Pancreas: Unremarkable. No pancreatic ductal dilatation or surrounding inflammatory changes. Spleen: Redemonstrated infarctions of the spleen. Adrenals/Urinary Tract: Adrenal glands are unremarkable. Kidneys are normal, without renal calculi, solid  lesion, or hydronephrosis. Bladder is unremarkable. Stomach/Bowel: Stomach is within normal limits. Appendix appears normal. No evidence of bowel wall thickening, distention, or inflammatory changes. Lymphatic: No enlarged abdominal lymph nodes. Other: No abdominal wall hernia or abnormality. No abdominopelvic ascites. Musculoskeletal: No acute or significant osseous findings. Review of the MIP images confirms the above findings. IMPRESSION: 1. There is a small dissection and/or pseudoaneurysm of the distal splenic artery, caliber no greater than 6 mm, likely source of splenic infarctions. 2. No other significant vascular abnormality in the chest or abdomen. 3. Redemonstrated infarctions of the spleen. 4. Hepatic steatosis. 5. There is a 3 mm noncalcified nodule of the lateral left lung base. No follow-up needed if patient is low-risk. Non-contrast chest CT can be considered in 12 months if patient is high-risk. This recommendation follows the consensus statement: Guidelines for Management of Incidental Pulmonary Nodules Detected on CT Images: From the Fleischner Society 2017; Radiology 2017; 284:228-243. Electronically Signed   By: Eddie Candle M.D.   On: 08/04/2019 11:12   CT Abdomen Pelvis W Contrast  Result Date: 08/01/2019 CLINICAL DATA:  Left upper quadrant abdominal pain x4 days EXAM: CT ABDOMEN AND PELVIS WITH CONTRAST TECHNIQUE: Multidetector CT imaging of  the abdomen and pelvis was performed using the standard protocol following bolus administration of intravenous contrast. CONTRAST:  128mL OMNIPAQUE IOHEXOL 300 MG/ML  SOLN COMPARISON:  None. FINDINGS: Lower chest: Lung bases are clear. Hepatobiliary: Liver is within normal limits. Gallbladder is unremarkable. No intrahepatic or extrahepatic ductal dilatation. Pancreas: Within normal limits. Spleen: Heterogeneous hypoperfusion of the spleen, which persists on delayed imaging (series 7/image 1), suggesting developing splenic infarcts. Splenic vein is patent. However, there is splenic artery occlusion. Adrenals/Urinary Tract: Within normal limits. Kidneys are within normal limits.  No hydronephrosis. Bladder is mildly thick-walled although underdistended. Stomach/Bowel: Stomach is notable for a tiny hiatal hernia. No evidence of bowel obstruction. Appendix is not discretely visualized. Vascular/Lymphatic: No evidence of abdominal aortic aneurysm. Mild atherosclerotic calcifications the bilateral common iliac arteries. Splenic artery occlusion, as described above. Reproductive: Prostate is unremarkable. Other: No abdominopelvic ascites. Musculoskeletal: Status post PLIF at L4-5. Mild degenerative changes of the visualized thoracolumbar spine. IMPRESSION: Developing splenic infarcts, likely secondary to splenic artery occlusion. Splenic vein is patent. These results were called by telephone at the time of interpretation on 08/01/2019 at 5:40 am to provider Dr Alfred Levins, who verbally acknowledged these results. Electronically Signed   By: Julian Hy M.D.   On: 08/01/2019 05:40   CT ANGIO CHEST AORTA W/CM &/OR WO/CM  Result Date: 08/04/2019 CLINICAL DATA:  Ischemia, abdominal pain, developing splenic infarction on prior CT EXAM: CT ANGIOGRAPHY CHEST AND ABDOMEN TECHNIQUE: Multidetector CT imaging of the chest and abdomen was performed using the standard protocol during bolus administration of intravenous contrast.  Multiplanar CT image reconstructions and MIPs were obtained to evaluate the vascular anatomy. CONTRAST:  69mL OMNIPAQUE IOHEXOL 350 MG/ML SOLN COMPARISON:  CT abdomen pelvis, 08/01/2019 FINDINGS: CTA CHEST FINDINGS Cardiovascular: Preferential opacification of the thoracic aorta. Normal contour and caliber of the thoracic aorta. No evidence of aneurysm, dissection, or other acute aortic pathology. No significant atherosclerosis. Normal heart size. No pericardial effusion. Mediastinum/Nodes: No enlarged mediastinal, hilar, or axillary lymph nodes. Thyroid gland, trachea, and esophagus demonstrate no significant findings. Lungs/Pleura: Small, benign calcified pulmonary nodules of the left lower lobe. 3 mm noncalcified nodule of the lateral left lung base (series 5, image 107). No pleural effusion or pneumothorax. Musculoskeletal: No chest wall abnormality. No acute or significant  osseous findings. Review of the MIP images confirms the above findings. CTA ABDOMEN FINDINGS VASCULAR Normal contour and caliber of the abdominal aorta. No evidence of aneurysm, dissection, or other acute aortic pathology. Standard branching pattern of the abdominal aorta with solitary bilateral renal arteries. There is a small dissection and or pseudoaneurysm of the distal splenic artery, caliber no greater than 6 mm (series 7, image 86, series 4, image 102, series 6, image 53) of and mild atherosclerosis of the included common iliac arteries. No significant aortic atherosclerosis Review of the MIP images confirms the above findings. NON-VASCULAR Hepatobiliary: No solid liver abnormality is seen. Hepatic steatosis. No gallstones, gallbladder wall thickening, or biliary dilatation. Pancreas: Unremarkable. No pancreatic ductal dilatation or surrounding inflammatory changes. Spleen: Redemonstrated infarctions of the spleen. Adrenals/Urinary Tract: Adrenal glands are unremarkable. Kidneys are normal, without renal calculi, solid lesion, or  hydronephrosis. Bladder is unremarkable. Stomach/Bowel: Stomach is within normal limits. Appendix appears normal. No evidence of bowel wall thickening, distention, or inflammatory changes. Lymphatic: No enlarged abdominal lymph nodes. Other: No abdominal wall hernia or abnormality. No abdominopelvic ascites. Musculoskeletal: No acute or significant osseous findings. Review of the MIP images confirms the above findings. IMPRESSION: 1. There is a small dissection and/or pseudoaneurysm of the distal splenic artery, caliber no greater than 6 mm, likely source of splenic infarctions. 2. No other significant vascular abnormality in the chest or abdomen. 3. Redemonstrated infarctions of the spleen. 4. Hepatic steatosis. 5. There is a 3 mm noncalcified nodule of the lateral left lung base. No follow-up needed if patient is low-risk. Non-contrast chest CT can be considered in 12 months if patient is high-risk. This recommendation follows the consensus statement: Guidelines for Management of Incidental Pulmonary Nodules Detected on CT Images: From the Fleischner Society 2017; Radiology 2017; 284:228-243. Electronically Signed   By: Eddie Candle M.D.   On: 08/04/2019 11:12   Assessment/Plan The patient is a 54 year old male with past medical history of uncontrolled diabetes type 2, hyperlipidemia, COPD with chronic bronchitis, active tobacco abuse who presented to the [facility] emergency department with chief complaint of left upper quadrant pain.  Found to have possible dissection and/or pseudoaneurysm of the distal splenic artery with splenic infarctions  1.  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  Recommend the patient undergo a splenic angiogram to assess his anatomy and rule out dissection or pseudoaneurysm as cause of splenic artery thrombosis/splenic infarctions.  Procedure,  risks and benefits plan to the patient.  All questions answered.  The patient was to proceed.  Continue heparin drip this does not need to be stopped for the angiogram.  We will plan on this early tomorrow afternoon with Dr. [name]  2.  Hyperlipidemia: Recommend aspirin and statin for medical management Encouraged good control as its slows the progression of atherosclerotic disease.  3. Diabetes: Now on appropriate medications.  Patient had stopped his diabetic medication on his own in the past. Encouraged good control as its slows the progression of atherosclerotic disease.  4. Tobacco Abuse: We had a discussion for approximately three minutes regarding the absolute need for smoking cessation due to the deleterious nature of tobacco on the vascular system. We discussed the tobacco use would diminish patency of any intervention, and likely significantly worsen progression of disease. We discussed multiple agents for quitting including replacement therapy or medications to reduce cravings such as Chantix. The patient voices their  understanding of the importance of smoking cessation.  Discussed with Dr. Francene Castle, PA-C  08/05/2019 2:30 PM  This note was created with Dragon medical transcription system.  Any error is purely unintentional

## 2019-08-06 ENCOUNTER — Encounter
Admission: EM | Disposition: A | Discharge status: Home / Self Care | Payer: Self-pay | Source: Home / Self Care | Attending: Internal Medicine

## 2019-08-06 DIAGNOSIS — D735 Infarction of spleen: Secondary | ICD-10-CM | POA: Diagnosis not present

## 2019-08-06 HISTORY — PX: VISCERAL ANGIOGRAPHY: CATH118276

## 2019-08-06 LAB — BASIC METABOLIC PANEL
Anion gap: 8 (ref 5–15)
BUN: 14 mg/dL (ref 6–20)
CO2: 25 mmol/L (ref 22–32)
Calcium: 9 mg/dL (ref 8.9–10.3)
Chloride: 105 mmol/L (ref 98–111)
Creatinine, Ser: 0.62 mg/dL (ref 0.61–1.24)
GFR calc Af Amer: >60 mL/min (ref >60)
GFR calc non Af Amer: >60 mL/min (ref >60)
Glucose, Bld: 254 mg/dL — ABNORMAL HIGH (ref 70–99)
Potassium: 3.9 mmol/L (ref 3.5–5.1)
Sodium: 138 mmol/L (ref 135–145)

## 2019-08-06 LAB — CBC
HCT: 41.6 % (ref 39.0–52.0)
Hemoglobin: 14.1 g/dL (ref 13.0–17.0)
MCH: 29.5 pg (ref 26.0–34.0)
MCHC: 33.9 g/dL (ref 30.0–36.0)
MCV: 87 fL (ref 80.0–100.0)
Platelets: 486 10*3/uL — ABNORMAL HIGH (ref 150–400)
RBC: 4.78 MIL/uL (ref 4.22–5.81)
RDW: 12.1 % (ref 11.5–15.5)
WBC: 8.8 10*3/uL (ref 4.0–10.5)
nRBC: 0 % (ref 0.0–0.2)

## 2019-08-06 LAB — HEPARIN LEVEL (UNFRACTIONATED): Heparin Unfractionated: 0.42 IU/mL (ref 0.30–0.70)

## 2019-08-06 LAB — GLUCOSE, CAPILLARY
Glucose-Capillary: 196 mg/dL — ABNORMAL HIGH (ref 70–99)
Glucose-Capillary: 97 mg/dL (ref 70–99)
Glucose-Capillary: 112 mg/dL — ABNORMAL HIGH (ref 70–99)
Glucose-Capillary: 103 mg/dL — ABNORMAL HIGH (ref 70–99)
Glucose-Capillary: 286 mg/dL — ABNORMAL HIGH (ref 70–99)

## 2019-08-06 SURGERY — VISCERAL ANGIOGRAPHY
Anesthesia: Moderate Sedation

## 2019-08-06 MED ORDER — FENTANYL CITRATE (PF) 100 MCG/2ML IJ SOLN
INTRAMUSCULAR | Status: AC
  Filled 2019-08-06: qty 2

## 2019-08-06 MED ORDER — HEPARIN SODIUM (PORCINE) 1000 UNIT/ML IJ SOLN
INTRAMUSCULAR | Status: AC
  Filled 2019-08-06: qty 1

## 2019-08-06 MED ORDER — METHYLPREDNISOLONE SODIUM SUCC 125 MG IJ SOLR: 125.0000 mg | Freq: Once | INTRAMUSCULAR | Status: DC | PRN

## 2019-08-06 MED ORDER — ASPIRIN EC 81 MG PO TBEC
81.0000 mg | DELAYED_RELEASE_TABLET | Freq: Every day | ORAL | Status: DC
  Administered 2019-08-06 – 2019-08-07 (×2): 81 mg via ORAL
  Filled 2019-08-06: qty 1

## 2019-08-06 MED ORDER — DIPHENHYDRAMINE HCL 50 MG/ML IJ SOLN: 50.0000 mg | Freq: Once | INTRAMUSCULAR | Status: DC | PRN

## 2019-08-06 MED ORDER — SODIUM CHLORIDE 0.9 % IV SOLN: INTRAVENOUS | Status: DC

## 2019-08-06 MED ORDER — HEPARIN SODIUM (PORCINE) 1000 UNIT/ML IJ SOLN
INTRAMUSCULAR | Status: DC | PRN
  Administered 2019-08-06: 4000 [IU] via INTRAVENOUS

## 2019-08-06 MED ORDER — MIDAZOLAM HCL 2 MG/2ML IJ SOLN
INTRAMUSCULAR | Status: DC | PRN
  Administered 2019-08-06 (×2): 1 mg via INTRAVENOUS
  Administered 2019-08-06: 0.5 mg via INTRAVENOUS
  Administered 2019-08-06: 2 mg via INTRAVENOUS
  Administered 2019-08-06: 1 mg via INTRAVENOUS

## 2019-08-06 MED ORDER — ASPIRIN EC 81 MG PO TBEC
DELAYED_RELEASE_TABLET | ORAL | Status: AC
  Filled 2019-08-06: qty 1

## 2019-08-06 MED ORDER — MIDAZOLAM HCL 5 MG/5ML IJ SOLN
INTRAMUSCULAR | Status: AC
  Filled 2019-08-06: qty 5

## 2019-08-06 MED ORDER — CLOPIDOGREL BISULFATE 75 MG PO TABS
ORAL_TABLET | ORAL | Status: AC
  Administered 2019-08-06: 300 mg via ORAL
  Filled 2019-08-06: qty 4

## 2019-08-06 MED ORDER — FENTANYL CITRATE (PF) 100 MCG/2ML IJ SOLN
INTRAMUSCULAR | Status: DC | PRN
  Administered 2019-08-06 (×2): 12.5 ug via INTRAVENOUS
  Administered 2019-08-06 (×2): 25 ug via INTRAVENOUS
  Administered 2019-08-06: 50 ug via INTRAVENOUS

## 2019-08-06 MED ORDER — MIDAZOLAM HCL 2 MG/ML PO SYRP
8.0000 mg | ORAL_SOLUTION | Freq: Once | ORAL | Status: DC | PRN
  Filled 2019-08-06: qty 4

## 2019-08-06 MED ORDER — CLOPIDOGREL BISULFATE 75 MG PO TABS
150.0000 mg | ORAL_TABLET | Freq: Every day | ORAL | Status: DC
  Administered 2019-08-07: 150 mg via ORAL
  Filled 2019-08-06: qty 2

## 2019-08-06 MED ORDER — INSULIN ASPART 100 UNIT/ML ~~LOC~~ SOLN
5.0000 [IU] | Freq: Three times a day (TID) | SUBCUTANEOUS | Status: DC
  Administered 2019-08-07 (×2): 5 [IU] via SUBCUTANEOUS
  Filled 2019-08-06 (×2): qty 1

## 2019-08-06 MED ORDER — CEFAZOLIN SODIUM-DEXTROSE 1-4 GM/50ML-% IV SOLN
1.0000 g | Freq: Once | INTRAVENOUS | Status: AC
  Administered 2019-08-06: 1 g via INTRAVENOUS
  Filled 2019-08-06: qty 50

## 2019-08-06 MED ORDER — FAMOTIDINE 20 MG PO TABS: 40.0000 mg | ORAL_TABLET | Freq: Once | ORAL | Status: DC | PRN

## 2019-08-06 MED ORDER — CLOPIDOGREL BISULFATE 300 MG PO TABS: 300.0000 mg | ORAL_TABLET | ORAL | Status: AC

## 2019-08-06 MED ORDER — MIDAZOLAM HCL 2 MG/2ML IJ SOLN
INTRAMUSCULAR | Status: AC
  Filled 2019-08-06: qty 2

## 2019-08-06 MED ORDER — CEFAZOLIN SODIUM-DEXTROSE 1-4 GM/50ML-% IV SOLN
INTRAVENOUS | Status: AC
  Filled 2019-08-06: qty 50

## 2019-08-06 MED ORDER — HYDROMORPHONE HCL 1 MG/ML IJ SOLN: 1.0000 mg | Freq: Once | INTRAMUSCULAR | Status: DC | PRN

## 2019-08-06 MED ORDER — ONDANSETRON HCL 4 MG/2ML IJ SOLN: 4.0000 mg | Freq: Four times a day (QID) | INTRAMUSCULAR | Status: DC | PRN

## 2019-08-06 SURGICAL SUPPLY — 24 items
CATH ANGIO 5F PIGTAIL 65CM (CATHETERS) ×1 IMPLANT
CATH MICROCATH PRGRT 2.8F 110 (CATHETERS) IMPLANT
CATH SEEKER 014X150 (CATHETERS) ×1 IMPLANT
CATH VS15FR (CATHETERS) ×1 IMPLANT
DEVICE PRESTO INFLATION (MISCELLANEOUS) ×1 IMPLANT
DEVICE SAFEGUARD 24CM (GAUZE/BANDAGES/DRESSINGS) ×1 IMPLANT
DEVICE STARCLOSE SE CLOSURE (Vascular Products) ×1 IMPLANT
GLIDEWIRE ADV .014X300CM (WIRE) ×1 IMPLANT
GLIDEWIRE STIFF .35X180X3 HYDR (WIRE) ×1 IMPLANT
GUIDEWIRE PFTE-COATED .018X300 (WIRE) ×2 IMPLANT
MICROCATH PROGREAT 2.8F 110 CM (CATHETERS) ×2
NDL ENTRY 21GA 7CM ECHOTIP (NEEDLE) IMPLANT
NEEDLE ENTRY 21GA 7CM ECHOTIP (NEEDLE) ×2 IMPLANT
PACK ANGIOGRAPHY (CUSTOM PROCEDURE TRAY) ×1 IMPLANT
SET INTRO CAPELLA COAXIAL (SET/KITS/TRAYS/PACK) ×1 IMPLANT
SHEATH ANL 5FRX45 (SHEATH) ×1 IMPLANT
SHEATH ANL2 6FRX45 HC (SHEATH) ×1 IMPLANT
SHEATH BRITE TIP 5FRX11 (SHEATH) ×2 IMPLANT
STENT HERCULINK RX 5.0X15X135 (Permanent Stent) IMPLANT
STENT RESOLUTE ONYX 4.5X15 (Permanent Stent) ×1 IMPLANT
SYR MEDRAD MARK 7 150ML (SYRINGE) ×1 IMPLANT
TUBING CONTRAST HIGH PRESS 72 (TUBING) ×2 IMPLANT
WIRE G 018X200 V18 (WIRE) ×1 IMPLANT
WIRE SPARTACORE .014X300CM (WIRE) ×1 IMPLANT

## 2019-08-06 NOTE — Consult Note (Signed)
ANTICOAGULATION CONSULT NOTE -  Pharmacy Consult for Heparin Drip Indication: Splenic Artery Occlusion  Allergies  Allergen Reactions  . Wasp Venom Anaphylaxis  . Coffee Flavor Nausea And Vomiting    Patient states coffee gives him nausea and stomach cramps  . Onion    Patient Measurements: Height: 5\' 4"  (162.6 cm) Weight: 81.2 kg (179 lb) IBW/kg (Calculated) : 59.2 Heparin Dosing Weight: 76.2 kg  Vital Signs: Temp: 98 F (36.7 C) (05/28 0513) Temp Source: Oral (05/28 0513) BP: 117/84 (05/28 0513) Pulse Rate: 93 (05/28 0513)  Labs: Recent Labs    08/04/19 0411 08/04/19 0411 08/04/19 1200 08/04/19 1730 08/05/19 0410 08/06/19 0523  HGB 14.7   < >  --   --  14.0 14.1  HCT 43.3  --   --   --  42.8 41.6  PLT 422*  --   --   --  453* 486*  HEPARINUNFRC 0.29*  --    < > 0.40 0.51 0.42  CREATININE  --   --   --   --   --  0.62   < > = values in this interval not displayed.    Estimated Creatinine Clearance: 102.7 mL/min (by C-G formula based on SCr of 0.62 mg/dL).  Medical History: Past Medical History:  Diagnosis Date  . COPD (chronic obstructive pulmonary disease) ([place])   . Diabetes mellitus without complication ([place])   . Hyperlipemia   . Tobacco abuse     Medications:  Medications Prior to Admission  Medication Sig Dispense Refill Last Dose  . atorvastatin (LIPITOR) 40 MG tablet Take 40 mg by mouth daily.   Not Taking at Unknown time  . diphenhydrAMINE (BENADRYL) 25 MG tablet Take 1 tablet (25 mg total) by mouth every 6 (six) hours as needed. 30 tablet 0   . EPINEPHrine 0.3 mg/0.3 mL IJ SOAJ injection Inject 0.3 mLs (0.3 mg total) into the muscle once. Follow package instructions as needed for severe allergy or anaphylactic reaction. (Patient not taking: Reported on 08/01/2019) 1 Device 2 Not Taking at Unknown time  . metFORMIN (GLUCOPHAGE) 500 MG tablet Take by mouth 2 (two) times daily with a meal.   Not Taking at Unknown time  . predniSONE (DELTASONE) 20 MG  tablet Take 2 tablets (40 mg total) by mouth daily. 8 tablet 0    Scheduled:  . atorvastatin  40 mg Oral Daily  . dextromethorphan-guaiFENesin  1 tablet Oral BID  . insulin aspart  0-9 Units Subcutaneous TID WC  . insulin aspart  3 Units Subcutaneous TID WC  . insulin glargine  15 Units Subcutaneous QHS  . nicotine  21 mg Transdermal Daily   Infusions:  . sodium chloride 75 mL/hr at 08/06/19 0027  . heparin 1,550 Units/hr (08/06/19 0555)   PRN: acetaminophen, albuterol, iohexol, morphine injection, ondansetron (ZOFRAN) IV, oxyCODONE-acetaminophen, senna-docusate Anti-infectives (From admission, onward)   None     Assessment: Pharmacy has been consulted to initiate heparin infusion in 54yo patient complaining of left upper quadrant abdominal pain x 4 days. CT of abdomen shows developing splenic infarcts, likely secondary to splenic artery occlusion. Splenic vein is patent. Patient with no history of PTA anticoagulant use. Baseline labs: Hgb 15.5, Plts 383, aPTT/INR pending. Will initiate heparin drip immediately.  Given 4500 units bolus and heparin infusion started at 1300 units/hr  5/24 2034 HL 0.4  0525 0349 HL 0.39, CBC stable 0526 0411 HL 0.29, subtherapeutic x 1.  CBC stable.  0526 1200 HL 0.35 0526 1730  HL 0.40 0527 0410 HL 0.51, therapeutic, CBC stable 0528 0523 HL 0.42, therapeutic, CBC stable  Goal of Therapy:  Heparin level 0.3-0.7 units/ml Monitor platelets by anticoagulation protocol: Yes   Plan:  Heparin level is therapeutic x 4.  - Will continue heparin drip @ 1550 units/hr. Will recheck HL with AM labs and CBC daily.   Continue to monitor H&H and platelets  Ena Dawley, PharmD Clinical Pharmacist 08/06/2019 6:28 AM

## 2019-08-06 NOTE — H&P (Signed)
[facility] History & Physical Update  The patient was interviewed and re-examined.  The patient's previous History and Physical has been reviewed and is unchanged.  There is no change in the plan of care. We plan to proceed with the scheduled procedure.  Hortencia Pilar, MD  08/06/2019, 4:19 PM

## 2019-08-06 NOTE — Progress Notes (Signed)
PROGRESS NOTE    Benjamin Conley  [id number] DOB: September 15, 1965 DOA: 08/01/2019 PCP: Charlotte Sanes, MD   Brief Narrative:  Benjamin Conley is a 54 y.o. male with medical history significant of hyperlipidemia, diabetes mellitus, tobacco abuse, COPD, who presents with abdominal pain. CT abdomen/pelvis that showed developing splenic infarcts, likely secondary to splenic artery occlusion, and splenic vein is patent. Pt is admitted to [facility] bed as inpatient by accepting MD.  ED physician discussed with vascular surgeon, Dr. Teola Bradley.  And they advise starting him on heparin infusion.  Subjective: Patient has no new complaints today.  He was waiting for his angiography later today.  Assessment & Plan:   Principal Problem:   Splenic infarct Active Problems:   Diabetes mellitus without complication (HCC)   Hyperlipemia   COPD with chronic bronchitis (HCC)   Tobacco abuse  Splenic infarct: EDP discussed with vascular surgeon, Dr. Teola Bradley, "who recommended putting patient on heparin and admitted to the hospitalist for close monitoring for possibility of splenic rupture and for evaluation of possible etiology of splenic artery occlusion". -Hypercoagulable labs so far are normal homocystine, protein C and S, borderline elevation of Antithrombin III function, dRVVT Mix mildly elevated,rest of the labs are pending. CTA of chest and abdomen with a small dissection and/or pseudoaneurysm of the distal splenic artery, caliber no greater than 6 mm, likely source of splenic infarctions. -Going for spleenic angiography by Vascular surgery today. -Continue with heparin infusion. -Continue with pain management. -Continue to monitor as he is high risk for rupture.  Type 2 diabetes.  Prior A1c in the system was done in 2017 and it was 6.8.  Since then he stopped taking his insulin.  Repeat A1c 11.3 with hyperglycemia. Discussed in detail regarding taking care of his diabetes to prevent complications  which can drastically decreased his quality of life. -Continue Lantus 15 units at bedtime. -3 Units of NovoLog with meals. -Continue with SSI  Hyperlipemia -lipitor  COPD with chronic bronchitis ([place]): stable -prn albuterol and Mucinex  Tobacco abuse: -Did counseling about importance of quitting smoking -Nicotine patch   Objective: Vitals:   08/06/19 0513 08/06/19 0942 08/06/19 1147 08/06/19 1408  BP: 117/84 109/79 119/77   Pulse: 93 79 84 93  Resp: 16 16 (!) 22 20  Temp: 98 F (36.7 C) 98.3 F (36.8 C) 97.7 F (36.5 C) 98 F (36.7 C)  TempSrc: Oral Oral Oral Oral  SpO2: 97% 98% 99% 98%  Weight:    81.2 kg  Height:    5\' 4"  (1.626 m)    Intake/Output Summary (Last 24 hours) at 08/06/2019 1457 Last data filed at 08/06/2019 0740 Gross per 24 hour  Intake 2472.63 ml  Output 2525 ml  Net -52.37 ml   Filed Weights   08/01/19 0124 08/06/19 1408  Weight: 81.2 kg 81.2 kg    Examination:  General exam: Appears calm and comfortable  Respiratory system: Clear to auscultation. Respiratory effort normal. Cardiovascular system: S1 & S2 heard, RRR. No JVD, murmurs, rubs, gallops or clicks. Gastrointestinal system: Soft, LUQ tenderness, nondistended, bowel sounds positive. Central nervous system: Alert and oriented. No focal neurological deficits.Symmetric 5 x 5 power. Extremities: No edema, no cyanosis, pulses intact and symmetrical. Skin: No rashes, lesions or ulcers Psychiatry: Judgement and insight appear normal. Mood & affect appropriate.    DVT prophylaxis: Heparin Code Status: Full Family Communication: Discussed with patient Disposition Plan:  Status is: Inpatient  Remains inpatient appropriate because:IV treatments appropriate due to intensity of illness  or inability to take PO   Dispo: The patient is from: Home              Anticipated d/c is to: Home              Anticipated d/c date is: 1 day.              Patient currently is not medically stable to  d/c.  Patient is currently on heparin infusion for splenic infarct, pending lab work to find a cause.  Going for splenic angiography today.  Consultants:   Vascular surgery.  Procedures:  Antimicrobials:   Data Reviewed: I have personally reviewed following labs and imaging studies  CBC: Recent Labs  Lab 08/02/19 0556 08/03/19 0349 08/04/19 0411 08/05/19 0410 08/06/19 0523  WBC 10.4 9.9 10.0 9.8 8.8  HGB 14.9 15.0 14.7 14.0 14.1  HCT 44.3 44.6 43.3 42.8 41.6  MCV 89.3 89.7 87.7 89.9 87.0  PLT 377 404* 422* 453* [id number]*   Basic Metabolic Panel: Recent Labs  Lab 08/01/19 0137 08/02/19 0556 08/06/19 0523  NA 133* 135 138  K 3.9 4.1 3.9  CL 95* 97* 105  CO2 26 27 25   GLUCOSE 285* 231* 254*  BUN 17 10 14   CREATININE 0.81 0.51* 0.62  CALCIUM 9.3 9.1 9.0   GFR: Estimated Creatinine Clearance: 102.7 mL/min (by C-G formula based on SCr of 0.62 mg/dL). Liver Function Tests: Recent Labs  Lab 08/01/19 0137  AST 17  ALT 18  ALKPHOS 92  BILITOT 0.7  PROT 8.2*  ALBUMIN 3.9   Recent Labs  Lab 08/01/19 0137  LIPASE 20   No results for input(s): AMMONIA in the last 168 hours. Coagulation Profile: Recent Labs  Lab 08/01/19 0610  INR 0.9   Cardiac Enzymes: No results for input(s): CKTOTAL, CKMB, CKMBINDEX, TROPONINI in the last 168 hours. BNP (last 3 results) No results for input(s): PROBNP in the last 8760 hours. HbA1C: No results for input(s): HGBA1C in the last 72 hours. CBG: Recent Labs  Lab 08/05/19 1213 08/05/19 1641 08/05/19 2050 08/06/19 0738 08/06/19 1144  GLUCAP 175* 142* 330* 196* 97   Lipid Profile: No results for input(s): CHOL, HDL, LDLCALC, TRIG, CHOLHDL, LDLDIRECT in the last 72 hours. Thyroid Function Tests: No results for input(s): TSH, T4TOTAL, FREET4, T3FREE, THYROIDAB in the last 72 hours. Anemia Panel: No results for input(s): VITAMINB12, FOLATE, FERRITIN, TIBC, IRON, RETICCTPCT in the last 72 hours. Sepsis Labs: No results for  input(s): PROCALCITON, LATICACIDVEN in the last 168 hours.  Recent Results (from the past 240 hour(s))  SARS Coronavirus 2 by RT PCR (hospital order, performed in [facility] hospital lab) Nasopharyngeal Nasopharyngeal Swab     Status: None   Collection Time: 08/01/19  6:10 AM   Specimen: Nasopharyngeal Swab  Result Value Ref Range Status   SARS Coronavirus 2 NEGATIVE NEGATIVE Final    Comment: (NOTE) SARS-CoV-2 target nucleic acids are NOT DETECTED. The SARS-CoV-2 RNA is generally detectable in upper and lower respiratory specimens during the acute phase of infection. The lowest concentration of SARS-CoV-2 viral copies this assay can detect is 250 copies / mL. A negative result does not preclude SARS-CoV-2 infection and should not be used as the sole basis for treatment or other patient management decisions.  A negative result may occur with improper specimen collection / handling, submission of specimen other than nasopharyngeal swab, presence of viral mutation(s) within the areas targeted by this assay, and inadequate number of viral copies (<250 copies /  mL). A negative result must be combined with clinical observations, patient history, and epidemiological information. Fact Sheet for Patients:   StrictlyIdeas.no Fact Sheet for Healthcare Providers: BankingDealers.co.za This test is not yet approved or cleared  by the [place] FDA and has been authorized for detection and/or diagnosis of SARS-CoV-2 by FDA under an Emergency Use Authorization (EUA).  This EUA will remain in effect (meaning this test can be used) for the duration of the COVID-19 declaration under Section 564(b)(1) of the Act, 21 U.S.C. section 360bbb-3(b)(1), unless the authorization is terminated or revoked sooner. Performed at [facility], [address], [place], [place] [address]      Radiology Studies: No results found.  Scheduled Meds: .  [MAR Hold] atorvastatin  40 mg Oral Daily  . [MAR Hold] dextromethorphan-guaiFENesin  1 tablet Oral BID  . [MAR Hold] insulin aspart  0-9 Units Subcutaneous TID WC  . [MAR Hold] insulin aspart  5 Units Subcutaneous TID WC  . [MAR Hold] insulin glargine  15 Units Subcutaneous QHS  . [MAR Hold] nicotine  21 mg Transdermal Daily   Continuous Infusions: . sodium chloride 75 mL/hr at 08/06/19 1233  . sodium chloride    . ceFAZolin    .  ceFAZolin (ANCEF) IV    . heparin 1,550 Units/hr (08/06/19 0740)     LOS: 5 days   Time spent: 35 minutes.  [name], MD [facility]  If 7PM-7AM, please contact night-coverage [email or web address]  08/06/2019, 2:57 PM   This record has been created using Systems analyst. Errors have been sought and corrected,but may not always be located. Such creation errors do not reflect on the standard of care.

## 2019-08-06 NOTE — Op Note (Addendum)
[facility] Percutaneous Study/Intervention Procedural Note   Date of Surgery: 08/06/2019  Surgeon(s): Hortencia Pilar   Assistants:None  Pre-operative Diagnosis: Splenic infarction; ischemic spleen Post-operative diagnosis: Same; subtotal occlusion of the distal splenic artery  Procedure(s) Performed: 1. Ultrasound guidance for vascular access right femoral artery 2. Catheter placement into splenic artery from right femoral approach 3. Aortogram and selective celiac angiogram 4. Balloon expandable stent placement to the splenic artery using a 4.5 mm diameter x 15 mm length resolute Onyx balloon expandable stent 5. StarClose closure device right femoral artery  Anesthesia: Continuous ECG pulse oximetry and cardiopulmonary monitoring was performed by the interventional radiology nurse.  Parenteral Versed and fentanyl was utilized.  Total sedation time was approximately 1 hour 20 minutes.  Fluoro Time: 16.3 minutes  Contrast: 50 cc  Indications: Patient is a 54 year old gentleman who presented with the acute onset of left flank pain.  Work-up has demonstrated a splenic infarction associated with ischemia.  CT angiogram suggests a stricture or occlusion in the more distal splenic artery.  Angiogram is performed to evaluate the lesion more thoroughly and potentially allow treatment. Risks and benefits were discussed and informed consent was obtained.  Procedure: The patient was identified and appropriate procedural time out was performed. The patient was then placed supine on the table and prepped and draped in the usual sterile fashion.Moderate conscious sedation was administered during a face to face encounter with the patient with the RN monitoring their vital signs, mental status, telemetry and pulse oximetry throughout the procedure.  Ultrasound was used to evaluate the right  common femoral artery. It was patent . A digital ultrasound image was acquired. A micropuncture needle was used to access the right common femoral artery under direct ultrasound guidance and a permanent image was performed. Micro sheath followed by a 0.035 J wire was advanced without resistance and a 5Fr sheath was placed. Pigtail catheter was then placed into the aorta and a lateral projection view was performed which showed typical orientations of the celiac and SMA.  I then selective cannulated the celiac with a VS 1. Selective imaging of the celiac and subsequently the splenic artery demonstrated subtotal occlusion approximately two thirds distal to the origin. The patient was then given 4000 units of intravenous heparin and I exchanged for a 6 French Ansell sheath and was able to negotiate the [place] so that the tip was within the splenic artery proper allowing me to perform treatment. I was able to navigate across the lesion without difficulty with a whisper wire and a 0.014 seeker catheter.  Initially I attempted to cross the lesion with a Herculink stent but this would not track through the tortuous splenic artery ultimately after trying several different wires still being unable to get the Herculink through I then selected a 4.5 mm diameter by 15 mm length balloon expandable resolute Onyx stent which tracked quite easily and deployed this across the stenosis of the splenic artery.  The balloon was inflated to 12 atm.  Less than 5% residual stenosis was seen on completion angiogram and I elected to terminate the procedure. The guide catheter was removed and oblique arteriogram was performed of the right femoral artery. StarClose closure device was deployed in the usual fashion with excellent hemostatic result.  Findings:  Aortogram:The aorta is opacified with a bolus injection contrast.  It is widely patent.  There is minimal evidence for atherosclerotic changes.  There are normal  origins to the SMA and the celiac.  There  appears to be a 40% smooth narrowing of the origin of the celiac consistent with mild median arcuate ligament syndrome.  Beyond this lesion the celiac is widely patent.  This did not appear to be hemodynamically significant and therefore I did not feel that intervention at this level is indicated. Splenic artery:The common hepatic and splenic arteries are widely patent proximally.  The celiac is quite tortuous.  There is a subtotal occlusion of the splenic artery approximately two thirds distal to its origin.  Beyond this there is reconstitution of the artery with filling of the splenic hilum.  Following angioplasty and stent placement with the resolute Onyx stent there is now wide patency with less than 5% residual stenosis and normal filling of the splenic hilum.     Disposition: Patient was taken to the recovery room in stable condition having tolerated the procedure well.   Hortencia Pilar 08/06/2019 6:19 PM   This note was created with Dragon Medical transcription system. Any errors in dictation are purely unintentional.

## 2019-08-06 NOTE — Care Management Important Message (Signed)
Important Message  Patient Details  Name: Benjamin Conley MRN: [id number] Date of Birth: [birth year]   Medicare Important Message Given:  Yes     Dannette Barbara 08/06/2019, 12:22 PM

## 2019-08-07 LAB — CBC
HCT: 41.1 % (ref 39.0–52.0)
Hemoglobin: 13.5 g/dL (ref 13.0–17.0)
MCH: 29.5 pg (ref 26.0–34.0)
MCHC: 32.8 g/dL (ref 30.0–36.0)
MCV: 89.9 fL (ref 80.0–100.0)
Platelets: 525 10*3/uL — ABNORMAL HIGH (ref 150–400)
RBC: 4.57 MIL/uL (ref 4.22–5.81)
RDW: 12.3 % (ref 11.5–15.5)
WBC: 9.6 10*3/uL (ref 4.0–10.5)
nRBC: 0 % (ref 0.0–0.2)

## 2019-08-07 LAB — BASIC METABOLIC PANEL
Anion gap: 6 (ref 5–15)
BUN: 10 mg/dL (ref 6–20)
CO2: 29 mmol/L (ref 22–32)
Calcium: 8.7 mg/dL — ABNORMAL LOW (ref 8.9–10.3)
Chloride: 102 mmol/L (ref 98–111)
Creatinine, Ser: 0.61 mg/dL (ref 0.61–1.24)
GFR calc Af Amer: >60 mL/min (ref >60)
GFR calc non Af Amer: >60 mL/min (ref >60)
Glucose, Bld: 158 mg/dL — ABNORMAL HIGH (ref 70–99)
Potassium: 3.8 mmol/L (ref 3.5–5.1)
Sodium: 137 mmol/L (ref 135–145)

## 2019-08-07 LAB — MAGNESIUM: Magnesium: 2.1 mg/dL (ref 1.7–2.4)

## 2019-08-07 LAB — GLUCOSE, CAPILLARY
Glucose-Capillary: 147 mg/dL — ABNORMAL HIGH (ref 70–99)
Glucose-Capillary: 227 mg/dL — ABNORMAL HIGH (ref 70–99)

## 2019-08-07 MED ORDER — ATORVASTATIN CALCIUM 40 MG PO TABS: 40.0000 mg | ORAL_TABLET | Freq: Every day | ORAL | 1 refills | Status: DC

## 2019-08-07 MED ORDER — ASPIRIN 81 MG PO TBEC: 81.0000 mg | DELAYED_RELEASE_TABLET | Freq: Every day | ORAL | 1 refills | Status: DC

## 2019-08-07 MED ORDER — NICOTINE 21 MG/24HR TD PT24: 21.0000 mg | MEDICATED_PATCH | Freq: Every day | TRANSDERMAL | 0 refills | Status: DC

## 2019-08-07 MED ORDER — PEN NEEDLES 31G X 5 MM MISC
20.0000 [IU] | Freq: Every day | 2 refills | Status: DC
Start: 2019-08-07 — End: 2019-08-21

## 2019-08-07 MED ORDER — METFORMIN HCL 500 MG PO TABS: 500.0000 mg | ORAL_TABLET | Freq: Two times a day (BID) | ORAL | 3 refills | Status: DC

## 2019-08-07 MED ORDER — BLOOD GLUCOSE MONITOR KIT: PACK | 0 refills | Status: DC

## 2019-08-07 MED ORDER — INSULIN GLARGINE 100 UNIT/ML SOLOSTAR PEN: 20.0000 [IU] | PEN_INJECTOR | Freq: Every day | SUBCUTANEOUS | 11 refills | Status: DC

## 2019-08-07 MED ORDER — CLOPIDOGREL BISULFATE 75 MG PO TABS: 150.0000 mg | ORAL_TABLET | Freq: Every day | ORAL | 1 refills | Status: DC

## 2019-08-07 MED ORDER — INSULIN GLARGINE 100 UNIT/ML ~~LOC~~ SOLN
20.0000 [IU] | Freq: Every day | SUBCUTANEOUS | Status: DC
  Filled 2019-08-07: qty 0.2

## 2019-08-07 NOTE — Progress Notes (Signed)
Patient is being discharged home.  Discharge papers given and explained to pt.  Pt verbalize understanding.  Meds and f/u reviewed.  Rx sent electronically to pharmacy.  Pt made aware.  Awaiting transportation.

## 2019-08-07 NOTE — Discharge Summary (Signed)
Physician Discharge Summary  Benjamin Conley WUJ:[id number] DOB: [birth year] DOA: 08/01/2019  PCP: Charlotte Sanes, MD  Admit date: 08/01/2019 Discharge date: 08/07/2019  Admitted From: Home Disposition:  Home  Recommendations for Outpatient Follow-up:  1. Follow up with PCP in 1-2 weeks 2. Follow-up with vascular surgery. 3. Please obtain BMP/CBC in one week 4. Please follow up on the following pending results:None  Home Health: No Equipment/Devices:None Discharge Condition: Stable CODE STATUS: Full Diet recommendation: Heart Healthy / Carb Modified   Brief/Interim Summary: Benjamin Ray Creameris a 54 y.o.malewith medical history significant ofhyperlipidemia, diabetes mellitus, tobacco abuse, COPD, who presents with abdominal pain. CT abdomen/pelvis that showed developing splenic infarcts, likely secondary to splenic artery occlusion, and splenic vein is patent.  He was started on heparin infusion and vascular surgery was consulted. Hypercoagulable labs so far are normal homocystine, protein C and S, borderline elevation of Antithrombin III function, dRVVT Mix mildly elevated. CTA of chest and abdomen with a small dissection and/or pseudoaneurysm of the distal splenic artery, caliber no greater than 6 mm, likely source of splenic infarctions. Vascular surgery took him for angiography and found a stenosis of distal splenic artery.  A successful stent was placed.  Patient was discharged home on aspirin and Plavix by vascular surgery and he will follow-up with them as an outpatient.  He will also continue with Lipitor.  Patient has been history of diabetes.  He stopped taking all his medications for the last couple of years, stating that he does not want to be a walking medicine cabinet.  A1c was elevated at 11.3.  Blood sugar was maintained with basal and short acting insulin during hospitalization.  He was discharged home on Lantus of 20 units at bedtime and requested to resume his prior  dose of Metformin.  He needs a close follow-up with his primary care physician for further management.  We counseled him quite extensively.  Discharge Diagnoses:  Principal Problem:   Splenic infarct Active Problems:   Diabetes mellitus without complication ([place])   Hyperlipemia   COPD with chronic bronchitis (HCC)   Tobacco abuse  Discharge Instructions  Discharge Instructions    Diet - low sodium heart healthy   Complete by: As directed    Discharge instructions   Complete by: As directed    It was pleasure taking care of you. You will take aspirin and Plavix along with Lipitor daily and will follow up with vascular surgery. Take care of your diabetes.  We are sending you home on Lantus 20 units at bedtime and Metformin twice daily.  Please follow-up with your primary care physician for further needs and a better control of your diabetes.   Increase activity slowly   Complete by: As directed    Increase activity slowly   Complete by: As directed      Allergies as of 08/07/2019      Reactions   Wasp Venom Anaphylaxis   Coffee Flavor Nausea And Vomiting   Patient states coffee gives him nausea and stomach cramps   Onion       Medication List    STOP taking these medications   predniSONE 20 MG tablet Commonly known as: Deltasone     TAKE these medications   aspirin 81 MG EC tablet Take 1 tablet (81 mg total) by mouth daily.   atorvastatin 40 MG tablet Commonly known as: LIPITOR Take 1 tablet (40 mg total) by mouth daily.   blood glucose meter kit and supplies Kit Dispense based  on patient and insurance preference. Use up to four times daily as directed. (FOR ICD-9 250.00, 250.01).   clopidogrel 75 MG tablet Commonly known as: PLAVIX Take 2 tablets (150 mg total) by mouth daily.   diphenhydrAMINE 25 MG tablet Commonly known as: BENADRYL Take 1 tablet (25 mg total) by mouth every 6 (six) hours as needed.   EPINEPHrine 0.3 mg/0.3 mL Soaj injection Commonly  known as: EPI-PEN Inject 0.3 mLs (0.3 mg total) into the muscle once. Follow package instructions as needed for severe allergy or anaphylactic reaction.   insulin glargine 100 UNIT/ML Solostar Pen Commonly known as: LANTUS Inject 20 Units into the skin at bedtime.   metFORMIN 500 MG tablet Commonly known as: GLUCOPHAGE Take 1 tablet (500 mg total) by mouth 2 (two) times daily with a meal. What changed: how much to take   nicotine 21 mg/24hr patch Commonly known as: NICODERM CQ - dosed in mg/24 hours Place 1 patch (21 mg total) onto the skin daily.   Pen Needles 31G X 5 MM Misc 20 Units by Does not apply route at bedtime.      Follow-up Information    Schnier, [name], MD Follow up in 1 month(s).   Specialties: Vascular Surgery, Cardiology, Radiology, Vascular Surgery Why: Will need splenic artery duplex with visit.  Contact information: [place] [address] [phone]        Charlotte Sanes, MD. Schedule an appointment as soon as possible for a visit.   Specialty: Family Medicine Contact information: [address] [address] [phone]          Allergies  Allergen Reactions  . Wasp Venom Anaphylaxis  . Coffee Flavor Nausea And Vomiting    Patient states coffee gives him nausea and stomach cramps  . Onion     Consultations:  Vascular surgery  Procedures/Studies: CT ANGIO ABDOMEN W &/OR WO CONTRAST  Result Date: 08/04/2019 CLINICAL DATA:  Ischemia, abdominal pain, developing splenic infarction on prior CT EXAM: CT ANGIOGRAPHY CHEST AND ABDOMEN TECHNIQUE: Multidetector CT imaging of the chest and abdomen was performed using the standard protocol during bolus administration of intravenous contrast. Multiplanar CT image reconstructions and MIPs were obtained to evaluate the vascular anatomy. CONTRAST:  34m OMNIPAQUE IOHEXOL 350 MG/ML SOLN COMPARISON:  CT abdomen pelvis, 08/01/2019 FINDINGS: CTA CHEST FINDINGS Cardiovascular:  Preferential opacification of the thoracic aorta. Normal contour and caliber of the thoracic aorta. No evidence of aneurysm, dissection, or other acute aortic pathology. No significant atherosclerosis. Normal heart size. No pericardial effusion. Mediastinum/Nodes: No enlarged mediastinal, hilar, or axillary lymph nodes. Thyroid gland, trachea, and esophagus demonstrate no significant findings. Lungs/Pleura: Small, benign calcified pulmonary nodules of the left lower lobe. 3 mm noncalcified nodule of the lateral left lung base (series 5, image 107). No pleural effusion or pneumothorax. Musculoskeletal: No chest wall abnormality. No acute or significant osseous findings. Review of the MIP images confirms the above findings. CTA ABDOMEN FINDINGS VASCULAR Normal contour and caliber of the abdominal aorta. No evidence of aneurysm, dissection, or other acute aortic pathology. Standard branching pattern of the abdominal aorta with solitary bilateral renal arteries. There is a small dissection and or pseudoaneurysm of the distal splenic artery, caliber no greater than 6 mm (series 7, image 86, series 4, image 102, series 6, image 53) of and mild atherosclerosis of the included common iliac arteries. No significant aortic atherosclerosis Review of the MIP images confirms the above findings. NON-VASCULAR Hepatobiliary: No solid liver abnormality is seen.  Hepatic steatosis. No gallstones, gallbladder wall thickening, or biliary dilatation. Pancreas: Unremarkable. No pancreatic ductal dilatation or surrounding inflammatory changes. Spleen: Redemonstrated infarctions of the spleen. Adrenals/Urinary Tract: Adrenal glands are unremarkable. Kidneys are normal, without renal calculi, solid lesion, or hydronephrosis. Bladder is unremarkable. Stomach/Bowel: Stomach is within normal limits. Appendix appears normal. No evidence of bowel wall thickening, distention, or inflammatory changes. Lymphatic: No enlarged abdominal lymph nodes.  Other: No abdominal wall hernia or abnormality. No abdominopelvic ascites. Musculoskeletal: No acute or significant osseous findings. Review of the MIP images confirms the above findings. IMPRESSION: 1. There is a small dissection and/or pseudoaneurysm of the distal splenic artery, caliber no greater than 6 mm, likely source of splenic infarctions. 2. No other significant vascular abnormality in the chest or abdomen. 3. Redemonstrated infarctions of the spleen. 4. Hepatic steatosis. 5. There is a 3 mm noncalcified nodule of the lateral left lung base. No follow-up needed if patient is low-risk. Non-contrast chest CT can be considered in 12 months if patient is high-risk. This recommendation follows the consensus statement: Guidelines for Management of Incidental Pulmonary Nodules Detected on CT Images: From the Fleischner Society 2017; Radiology 2017; 284:228-243. Electronically Signed   By: Eddie Candle M.D.   On: 08/04/2019 11:12   CT Abdomen Pelvis W Contrast  Result Date: 08/01/2019 CLINICAL DATA:  Left upper quadrant abdominal pain x4 days EXAM: CT ABDOMEN AND PELVIS WITH CONTRAST TECHNIQUE: Multidetector CT imaging of the abdomen and pelvis was performed using the standard protocol following bolus administration of intravenous contrast. CONTRAST:  121m OMNIPAQUE IOHEXOL 300 MG/ML  SOLN COMPARISON:  None. FINDINGS: Lower chest: Lung bases are clear. Hepatobiliary: Liver is within normal limits. Gallbladder is unremarkable. No intrahepatic or extrahepatic ductal dilatation. Pancreas: Within normal limits. Spleen: Heterogeneous hypoperfusion of the spleen, which persists on delayed imaging (series 7/image 1), suggesting developing splenic infarcts. Splenic vein is patent. However, there is splenic artery occlusion. Adrenals/Urinary Tract: Within normal limits. Kidneys are within normal limits.  No hydronephrosis. Bladder is mildly thick-walled although underdistended. Stomach/Bowel: Stomach is notable for a  tiny hiatal hernia. No evidence of bowel obstruction. Appendix is not discretely visualized. Vascular/Lymphatic: No evidence of abdominal aortic aneurysm. Mild atherosclerotic calcifications the bilateral common iliac arteries. Splenic artery occlusion, as described above. Reproductive: Prostate is unremarkable. Other: No abdominopelvic ascites. Musculoskeletal: Status post PLIF at L4-5. Mild degenerative changes of the visualized thoracolumbar spine. IMPRESSION: Developing splenic infarcts, likely secondary to splenic artery occlusion. Splenic vein is patent. These results were called by telephone at the time of interpretation on 08/01/2019 at 5:40 am to provider Dr VAlfred Levins who verbally acknowledged these results. Electronically Signed   By: SJulian HyM.D.   On: 08/01/2019 05:40   CT ANGIO CHEST AORTA W/CM &/OR WO/CM  Result Date: 08/04/2019 CLINICAL DATA:  Ischemia, abdominal pain, developing splenic infarction on prior CT EXAM: CT ANGIOGRAPHY CHEST AND ABDOMEN TECHNIQUE: Multidetector CT imaging of the chest and abdomen was performed using the standard protocol during bolus administration of intravenous contrast. Multiplanar CT image reconstructions and MIPs were obtained to evaluate the vascular anatomy. CONTRAST:  763mOMNIPAQUE IOHEXOL 350 MG/ML SOLN COMPARISON:  CT abdomen pelvis, 08/01/2019 FINDINGS: CTA CHEST FINDINGS Cardiovascular: Preferential opacification of the thoracic aorta. Normal contour and caliber of the thoracic aorta. No evidence of aneurysm, dissection, or other acute aortic pathology. No significant atherosclerosis. Normal heart size. No pericardial effusion. Mediastinum/Nodes: No enlarged mediastinal, hilar, or axillary lymph nodes. Thyroid gland, trachea, and esophagus demonstrate no significant findings.  Lungs/Pleura: Small, benign calcified pulmonary nodules of the left lower lobe. 3 mm noncalcified nodule of the lateral left lung base (series 5, image 107). No pleural  effusion or pneumothorax. Musculoskeletal: No chest wall abnormality. No acute or significant osseous findings. Review of the MIP images confirms the above findings. CTA ABDOMEN FINDINGS VASCULAR Normal contour and caliber of the abdominal aorta. No evidence of aneurysm, dissection, or other acute aortic pathology. Standard branching pattern of the abdominal aorta with solitary bilateral renal arteries. There is a small dissection and or pseudoaneurysm of the distal splenic artery, caliber no greater than 6 mm (series 7, image 86, series 4, image 102, series 6, image 53) of and mild atherosclerosis of the included common iliac arteries. No significant aortic atherosclerosis Review of the MIP images confirms the above findings. NON-VASCULAR Hepatobiliary: No solid liver abnormality is seen. Hepatic steatosis. No gallstones, gallbladder wall thickening, or biliary dilatation. Pancreas: Unremarkable. No pancreatic ductal dilatation or surrounding inflammatory changes. Spleen: Redemonstrated infarctions of the spleen. Adrenals/Urinary Tract: Adrenal glands are unremarkable. Kidneys are normal, without renal calculi, solid lesion, or hydronephrosis. Bladder is unremarkable. Stomach/Bowel: Stomach is within normal limits. Appendix appears normal. No evidence of bowel wall thickening, distention, or inflammatory changes. Lymphatic: No enlarged abdominal lymph nodes. Other: No abdominal wall hernia or abnormality. No abdominopelvic ascites. Musculoskeletal: No acute or significant osseous findings. Review of the MIP images confirms the above findings. IMPRESSION: 1. There is a small dissection and/or pseudoaneurysm of the distal splenic artery, caliber no greater than 6 mm, likely source of splenic infarctions. 2. No other significant vascular abnormality in the chest or abdomen. 3. Redemonstrated infarctions of the spleen. 4. Hepatic steatosis. 5. There is a 3 mm noncalcified nodule of the lateral left lung base. No  follow-up needed if patient is low-risk. Non-contrast chest CT can be considered in 12 months if patient is high-risk. This recommendation follows the consensus statement: Guidelines for Management of Incidental Pulmonary Nodules Detected on CT Images: From the Fleischner Society 2017; Radiology 2017; 284:228-243. Electronically Signed   By: Eddie Candle M.D.   On: 08/04/2019 11:12    Subjective: Was feeling better when seen today.  Denies any more pain.  He was ready to go home.  Discharge Exam: Vitals:   08/07/19 0755 08/07/19 1132  BP: 110/84 136/90  Pulse: 96 88  Resp: (!) 24 20  Temp: 99.6 F (37.6 C) 97.9 F (36.6 C)  SpO2: 97% 99%   Vitals:   08/06/19 1949 08/07/19 0500 08/07/19 0755 08/07/19 1132  BP: 106/67 127/82 110/84 136/90  Pulse: 91 86 96 88  Resp: 18 18 (!) 24 20  Temp: 98 F (36.7 C) 98.1 F (36.7 C) 99.6 F (37.6 C) 97.9 F (36.6 C)  TempSrc: Oral Oral Oral Oral  SpO2: 100% 98% 97% 99%  Weight:      Height:        General: Pt is alert, awake, not in acute distress Cardiovascular: RRR, S1/S2 +, no rubs, no gallops Respiratory: CTA bilaterally, no wheezing, no rhonchi Abdominal: Soft, NT, ND, bowel sounds + Extremities: no edema, no cyanosis   The results of significant diagnostics from this hospitalization (including imaging, microbiology, ancillary and laboratory) are listed below for reference.    Microbiology: Recent Results (from the past 240 hour(s))  SARS Coronavirus 2 by RT PCR (hospital order, performed in [facility] hospital lab) Nasopharyngeal Nasopharyngeal Swab     Status: None   Collection Time: 08/01/19  6:10 AM  Specimen: Nasopharyngeal Swab  Result Value Ref Range Status   SARS Coronavirus 2 NEGATIVE NEGATIVE Final    Comment: (NOTE) SARS-CoV-2 target nucleic acids are NOT DETECTED. The SARS-CoV-2 RNA is generally detectable in upper and lower respiratory specimens during the acute phase of infection. The lowest concentration of  SARS-CoV-2 viral copies this assay can detect is 250 copies / mL. A negative result does not preclude SARS-CoV-2 infection and should not be used as the sole basis for treatment or other patient management decisions.  A negative result may occur with improper specimen collection / handling, submission of specimen other than nasopharyngeal swab, presence of viral mutation(s) within the areas targeted by this assay, and inadequate number of viral copies (<250 copies / mL). A negative result must be combined with clinical observations, patient history, and epidemiological information. Fact Sheet for Patients:   StrictlyIdeas.no Fact Sheet for Healthcare Providers: BankingDealers.co.za This test is not yet approved or cleared  by the [place] FDA and has been authorized for detection and/or diagnosis of SARS-CoV-2 by FDA under an Emergency Use Authorization (EUA).  This EUA will remain in effect (meaning this test can be used) for the duration of the COVID-19 declaration under Section 564(b)(1) of the Act, 21 U.S.C. section 360bbb-3(b)(1), unless the authorization is terminated or revoked sooner. Performed at [facility], [address] [place], [place] [address]      Labs: BNP (last 3 results) No results for input(s): BNP in the last 8760 hours. Basic Metabolic Panel: Recent Labs  Lab 08/01/19 0137 08/02/19 0556 08/06/19 0523 08/07/19 0440  NA 133* 135 138 137  K 3.9 4.1 3.9 3.8  CL 95* 97* 105 102  CO2 26 27 25 29   GLUCOSE 285* 231* 254* 158*  BUN 17 10 14 10   CREATININE 0.81 0.51* 0.62 0.61  CALCIUM 9.3 9.1 9.0 8.7*  MG  --   --   --  2.1   Liver Function Tests: Recent Labs  Lab 08/01/19 0137  AST 17  ALT 18  ALKPHOS 92  BILITOT 0.7  PROT 8.2*  ALBUMIN 3.9   Recent Labs  Lab 08/01/19 0137  LIPASE 20   No results for input(s): AMMONIA in the last 168 hours. CBC: Recent Labs  Lab 08/03/19 0349  08/04/19 0411 08/05/19 0410 08/06/19 0523 08/07/19 0440  WBC 9.9 10.0 9.8 8.8 9.6  HGB 15.0 14.7 14.0 14.1 13.5  HCT 44.6 43.3 42.8 41.6 41.1  MCV 89.7 87.7 89.9 87.0 89.9  PLT 404* 422* 453* 486* 525*   Cardiac Enzymes: No results for input(s): CKTOTAL, CKMB, CKMBINDEX, TROPONINI in the last 168 hours. BNP: Invalid input(s): POCBNP CBG: Recent Labs  Lab 08/06/19 1502 08/06/19 1814 08/06/19 2102 08/07/19 0751 08/07/19 1128  GLUCAP 112* 103* 286* 147* 227*   D-Dimer No results for input(s): DDIMER in the last 72 hours. Hgb A1c No results for input(s): HGBA1C in the last 72 hours. Lipid Profile No results for input(s): CHOL, HDL, LDLCALC, TRIG, CHOLHDL, LDLDIRECT in the last 72 hours. Thyroid function studies No results for input(s): TSH, T4TOTAL, T3FREE, THYROIDAB in the last 72 hours.  Invalid input(s): FREET3 Anemia work up No results for input(s): VITAMINB12, FOLATE, FERRITIN, TIBC, IRON, RETICCTPCT in the last 72 hours. Urinalysis    Component Value Date/Time   COLORURINE YELLOW (A) 08/01/2019 0137   APPEARANCEUR CLEAR (A) 08/01/2019 0137   LABSPEC 1.024 08/01/2019 0137   PHURINE 6.0 08/01/2019 0137   GLUCOSEU >=500 (A) 08/01/2019 0137  HGBUR NEGATIVE 08/01/2019 [place] 08/01/2019 0137   KETONESUR 20 (A) 08/01/2019 0137   PROTEINUR NEGATIVE 08/01/2019 0137   NITRITE NEGATIVE 08/01/2019 0137   LEUKOCYTESUR NEGATIVE 08/01/2019 0137   Sepsis Labs Invalid input(s): PROCALCITONIN,  WBC,  LACTICIDVEN Microbiology Recent Results (from the past 240 hour(s))  SARS Coronavirus 2 by RT PCR (hospital order, performed in [facility] hospital lab) Nasopharyngeal Nasopharyngeal Swab     Status: None   Collection Time: 08/01/19  6:10 AM   Specimen: Nasopharyngeal Swab  Result Value Ref Range Status   SARS Coronavirus 2 NEGATIVE NEGATIVE Final    Comment: (NOTE) SARS-CoV-2 target nucleic acids are NOT DETECTED. The SARS-CoV-2 RNA is generally  detectable in upper and lower respiratory specimens during the acute phase of infection. The lowest concentration of SARS-CoV-2 viral copies this assay can detect is 250 copies / mL. A negative result does not preclude SARS-CoV-2 infection and should not be used as the sole basis for treatment or other patient management decisions.  A negative result may occur with improper specimen collection / handling, submission of specimen other than nasopharyngeal swab, presence of viral mutation(s) within the areas targeted by this assay, and inadequate number of viral copies (<250 copies / mL). A negative result must be combined with clinical observations, patient history, and epidemiological information. Fact Sheet for Patients:   StrictlyIdeas.no Fact Sheet for Healthcare Providers: BankingDealers.co.za This test is not yet approved or cleared  by the [place] FDA and has been authorized for detection and/or diagnosis of SARS-CoV-2 by FDA under an Emergency Use Authorization (EUA).  This EUA will remain in effect (meaning this test can be used) for the duration of the COVID-19 declaration under Section 564(b)(1) of the Act, 21 U.S.C. section 360bbb-3(b)(1), unless the authorization is terminated or revoked sooner. Performed at [facility], [address] [place], [place] [address]     Time coordinating discharge: Over 30 minutes  SIGNED:  [name], MD  [facility] 08/07/2019, 1:40 PM  If 7PM-7AM, please contact night-coverage [email or web address]  This record has been created using Systems analyst. Errors have been sought and corrected,but may not always be located. Such creation errors do not reflect on the standard of care.

## 2019-08-07 NOTE — Progress Notes (Signed)
    Subjective  - POD #1, s/p splenic artery stent  Pain much better today Wants to go home   Physical Exam:  Right groin soft.   abd soft with minimal tenderness       Assessment/Plan:  POD #1  Stable for discharge from vascular perspective Remove groin dressing prior to discharge Continue asa, plavix, and statin  Wells Brigitte Soderberg 08/07/2019 10:40 AM --  Vitals:   08/07/19 0500 08/07/19 0755  BP: 127/82 110/84  Pulse: 86 96  Resp: 18 (!) 24  Temp: 98.1 F (36.7 C) 99.6 F (37.6 C)  SpO2: 98% 97%    Intake/Output Summary (Last 24 hours) at 08/07/2019 1040 Last data filed at 08/07/2019 0912 Gross per 24 hour  Intake --  Output 1450 ml  Net -1450 ml     Laboratory CBC    Component Value Date/Time   WBC 9.6 08/07/2019 0440   HGB 13.5 08/07/2019 0440   HGB 16.0 08/30/2013 2322   HCT 41.1 08/07/2019 0440   HCT 47.8 08/30/2013 2322   PLT 525 (H) 08/07/2019 0440   PLT 358 08/30/2013 2322    BMET    Component Value Date/Time   NA 137 08/07/2019 0440   NA 137 08/30/2013 2322   K 3.8 08/07/2019 0440   K 3.5 08/30/2013 2322   CL 102 08/07/2019 0440   CL 101 08/30/2013 2322   CO2 29 08/07/2019 0440   CO2 23 08/30/2013 2322   GLUCOSE 158 (H) 08/07/2019 0440   GLUCOSE 374 (H) 08/30/2013 2322   BUN 10 08/07/2019 0440   BUN 13 08/30/2013 2322   CREATININE 0.61 08/07/2019 0440   CREATININE 0.69 08/30/2013 2322   CALCIUM 8.7 (L) 08/07/2019 0440   CALCIUM 9.1 08/30/2013 2322   GFRNONAA >60 08/07/2019 0440   GFRNONAA >60 08/30/2013 2322   GFRAA >60 08/07/2019 0440   GFRAA >60 08/30/2013 2322    COAG Lab Results  Component Value Date   INR 0.9 08/01/2019   No results found for: PTT  Antibiotics Anti-infectives (From admission, onward)   Start     Dose/Rate Route Frequency Ordered Stop   08/06/19 1400  ceFAZolin (ANCEF) IVPB 1 g/50 mL premix    Note to Pharmacy: To be given in specials   1 g 100 mL/hr over 30 Minutes Intravenous  Once 08/06/19  1340 08/06/19 1853       V. Leia Alf, M.D., [facility] [place] Office: [phone] Pager:  [phone]

## 2019-08-09 ENCOUNTER — Telehealth: Payer: Self-pay | Admitting: *Deleted

## 2019-08-10 ENCOUNTER — Other Ambulatory Visit: Payer: Self-pay

## 2019-08-10 ENCOUNTER — Encounter: Payer: Self-pay | Admitting: Cardiology

## 2019-08-11 ENCOUNTER — Encounter: Payer: Self-pay | Admitting: Nurse Practitioner

## 2019-08-11 ENCOUNTER — Other Ambulatory Visit: Payer: Self-pay

## 2019-08-11 ENCOUNTER — Ambulatory Visit (INDEPENDENT_AMBULATORY_CARE_PROVIDER_SITE_OTHER): Payer: Medicare HMO | Admitting: Nurse Practitioner

## 2019-08-11 VITALS — BP 120/82 | HR 80 | Temp 97.4°F | Ht 64.0 in | Wt 173.0 lb

## 2019-08-11 DIAGNOSIS — Z720 Tobacco use: Secondary | ICD-10-CM | POA: Diagnosis not present

## 2019-08-11 DIAGNOSIS — D735 Infarction of spleen: Secondary | ICD-10-CM | POA: Diagnosis not present

## 2019-08-11 DIAGNOSIS — E119 Type 2 diabetes mellitus without complications: Secondary | ICD-10-CM

## 2019-08-11 DIAGNOSIS — I10 Essential (primary) hypertension: Secondary | ICD-10-CM | POA: Diagnosis not present

## 2019-08-11 DIAGNOSIS — E875 Hyperkalemia: Secondary | ICD-10-CM | POA: Diagnosis not present

## 2019-08-11 DIAGNOSIS — J449 Chronic obstructive pulmonary disease, unspecified: Secondary | ICD-10-CM

## 2019-08-11 DIAGNOSIS — E785 Hyperlipidemia, unspecified: Secondary | ICD-10-CM | POA: Diagnosis not present

## 2019-08-11 LAB — LIPID PANEL
Cholesterol: 175 mg/dL (ref 0–200)
HDL: 30.8 mg/dL — ABNORMAL LOW (ref >39.00)
LDL Cholesterol: 108 mg/dL — ABNORMAL HIGH (ref 0–99)
NonHDL: 144.69
Total CHOL/HDL Ratio: 6
Triglycerides: 184 mg/dL — ABNORMAL HIGH (ref 0.0–149.0)
VLDL: 36.8 mg/dL (ref 0.0–40.0)

## 2019-08-11 LAB — BASIC METABOLIC PANEL
BUN: 11 mg/dL (ref 6–23)
CO2: 30 mEq/L (ref 19–32)
Calcium: 10.1 mg/dL (ref 8.4–10.5)
Chloride: 99 mEq/L (ref 96–112)
Creatinine, Ser: 0.66 mg/dL (ref 0.40–1.50)
GFR: 125.87 mL/min (ref >60.00)
Glucose, Bld: 214 mg/dL — ABNORMAL HIGH (ref 70–99)
Potassium: 5.2 mEq/L — ABNORMAL HIGH (ref 3.5–5.1)
Sodium: 135 mEq/L (ref 135–145)

## 2019-08-11 MED ORDER — CLOPIDOGREL BISULFATE 75 MG PO TABS: 150.0000 mg | ORAL_TABLET | Freq: Every day | ORAL | 1 refills | Status: DC

## 2019-08-11 NOTE — Progress Notes (Signed)
New Patient Office Visit  Subjective:  Patient ID: Benjamin Conley, male    DOB: 10-28-1965  Age: 54 y.o. MRN: [id number]  CC:  Chief Complaint  Patient presents with  . Transitions Of Care    hospital f/u [facility]    HPI Benjamin Conley is a 54 year old male who presents to get established with a PCP.  He presented to the emergency department with left upper quadrant abdominal pain.  He was admitted 08/01/19-08/07/19. Work-up demonstrated a splenic infarction associated with ischemia.  He underwent a CT angiogram that showed a stricture or occlusion in the distal splenic artery.  He was started on heparin infusion and vascular surgery was consulted.  Hypercoagulable labs so far normal homocystine, protein C and S, borderline elevation Antithrombin III function,dRVVT Mix mildly elevated.  CTA of the chest and abdomen with a small dissection and/or pseudoaneurysm of the distal splenic artery, caliber no greater than 6 mm, likely source of splenic infarctions.  He underwent an angiography by Dr. [name] and found a stenosis of distal splenic artery.  Successful stent was placed.  Patient was discharged home on aspirin and Plavix per vascular surgery.  He will also continue Lipitor.   Discharge diagnoses: Splenic Infarction Active problems:  Diabetes mellitus without complication,  Hyperlipidemia,  COPD with chronic bronchitis,  Tobacco abuse  Splenic infarction and s/p stent placement: He reports that he is taking his aspirin 81 mg daily.  He reports that he is unable to get the Plavix filled.  He says it was not at the pharmacy.  Patient does have health insurance and has no problems with medication affordability.  We looked into this today and he needs a prior authorization that was not completed.  We completed this, and I re-ordered his Plavix 75 mg 1 tablet twice daily as per recommendation of vascular surgery.  He will continue on his aspirin as well.  Patient states he is feeling well post  procedure.  Still has some mild left upper quadrant discomfort especially when he presses on the area or stretches his body.  He has follow-up arranged with vascular surgeon 09/09/2019.  T2DM: Patient has a history of diabetes mellitus and stopped taking all of his chronic medications in 2015-05-22.  He quit taking insulin before his mother died in May 22, 2015. He got tired of taking it and being a "talking medicine cabinet."  He wants to "let nature take its course." His admitting A1c was 11.3.  He was started on basal and short acting insulin during the hospitalization and discharged home on Lantus 20 units at bedtime and advised to resume his prior dose of Metformin 500 mg twice daily.    Currently, he reports today that he is taking his insulin and Metformin as directed.  He reports that he is checking his blood sugars 3 times daily.FBS -147, before lunch-280-240 and at bedtime- 200-300. Recalls results today by memory. He eats whatever he wants and skips breakfast. His first meal is at lunch at 1pm and supper is anywhere from  4pm-9pm.  No diarrhea with Metformin.  No hypoglycemia. He has not had an eye exam as long as he can remember.  He denies any neuropathy.  Lab Results  Component Value Date   HGBA1C 11.3 (H) 08/01/2019   Over wight: 29.7 : Advised heart healthy carb modified. Diet on DC from [facility].   Wt Readings from Last 3 Encounters:  08/11/19 173 lb (78.5 kg)  08/06/19 179 lb 0.2 oz (81.2 kg)  05/25/18 185 lb (83.9 kg)    HLD:  Lab Results  Component Value Date   CHOL 175 08/11/2019   HDL 30.80 (L) 08/11/2019   LDLCALC 108 (H) 08/11/2019   TRIG 184.0 (H) 08/11/2019   CHOLHDL 6 08/11/2019    Past Medical History:  Diagnosis Date  . COPD (chronic obstructive pulmonary disease) ([place])   . Diabetes mellitus without complication ([place])   . Hyperlipemia   . 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 Tobacco abuse     Past Surgical History:  Procedure Laterality Date  . BACK SURGERY     lumbar  .  STENT PLACEMENT VASCULAR ([place] [facility])  07/2019   stenosis of distal splenic artery and stent placed  . VISCERAL ANGIOGRAPHY N/A 08/06/2019   Procedure: VISCERAL ANGIOGRAPHY;  Surgeon: [name], MD;  Location: [facility];  Service: Cardiovascular;  Laterality: N/A;    Family History  Problem Relation Age of Onset  . Diabetes Mellitus II Mother   . Diabetes Mellitus II Brother   . Hyperlipidemia Brother     Social History   Socioeconomic History  . Marital status: Single    Spouse name: Not on file  . Number of children: Not on file  . Years of education: Not on file  . Highest education level: Not on file  Occupational History  . Not on file  Tobacco Use  . Smoking status: Current Every Day Smoker    Packs/day: 2.00    Years: 48.00    Pack years: 96.00    Types: Cigarettes  . Smokeless tobacco: Never Used  Substance and Sexual Activity  . Alcohol use: No    Comment: Never been a problem  . Drug use: No  . Sexual activity: Yes  Other Topics Concern  . Not on file  Social History Narrative   Single, lives alone, unemployed.   Social Determinants of Health   Financial Resource Strain:   . Difficulty of Paying Living Expenses:   Food Insecurity:   . Worried About Charity fundraiser in the Last Year:   . Arboriculturist in the Last Year:   Transportation Needs:   . Film/video editor (Medical):   [name] Lack of Transportation (Non-Medical):   Physical Activity:   . Days of Exercise per Week:   . Minutes of Exercise per Session:   Stress:   . Feeling of Stress :   Social Connections:   . Frequency of Communication with Friends and Family:   . Frequency of Social Gatherings with Friends and Family:   . Attends Religious Services:   . Active Member of Clubs or Organizations:   . Attends Archivist Meetings:   [name] Marital Status:   Intimate Partner Violence:   . Fear of Current or Ex-Partner:   . Emotionally Abused:   [name] Physically Abused:    . Sexually Abused:     ROS Review of Systems  Constitutional: Negative for chills, fatigue and fever.  HENT: Negative for congestion, sinus pressure and sore throat.   Eyes: Negative.   Respiratory: Positive for cough and shortness of breath.        Smokers cough and COPD with slight SOB- baseline. Smokes 2 ppd. Thinking about quitting. Smoking since 54 yo. He has tried Chantix, nicotine patches- worked a little while and would get urge and start back. He saw PULM in 2017- good PFTs. Used inhalers in the past.   Cardiovascular: Negative for chest pain, palpitations  and leg swelling.  Gastrointestinal: Positive for abdominal pain.       LUQ pain improving  Endocrine: Negative for cold intolerance, heat intolerance, polydipsia and polyuria.  Genitourinary: Negative for difficulty urinating.  Musculoskeletal: Positive for arthralgias and back pain.       Rods in his back and chronic left hip pain arthritis. No meds now- quiet now.  Skin: Negative.   Allergic/Immunologic: Negative for environmental allergies.  Neurological: Negative for dizziness, seizures, syncope, light-headedness and headaches.  Hematological: Negative for adenopathy. Does not bruise/bleed easily.  Psychiatric/Behavioral:       No concerns about depression/anxiety: PHQ-9:9 . He felt depressed a while back- 7 years ago. Sleep issues d/t back, poor energy, not working since back surgery. No SI/HI.     Objective:   Today's Vitals: BP 120/82 (BP Location: Left Arm, Patient Position: Sitting, Cuff Size: Normal)   Pulse 80   Temp (!) 97.4 F (36.3 C) (Skin)   Ht 5' 4"  (1.626 m)   Wt 173 lb (78.5 kg)   SpO2 99%   BMI 29.70 kg/m   Physical Exam Vitals reviewed.  Constitutional:      Appearance: Normal appearance.  HENT:     Head: Normocephalic.  Eyes:     Conjunctiva/sclera: Conjunctivae normal.     Pupils: Pupils are equal, round, and reactive to light.  Cardiovascular:     Rate and Rhythm: Normal rate and  regular rhythm.     Pulses: Normal pulses.     Heart sounds: Normal heart sounds.  Pulmonary:     Effort: Pulmonary effort is normal.     Breath sounds: Normal breath sounds. No wheezing, rhonchi or rales.  Abdominal:     Palpations: Abdomen is soft.     Tenderness: There is abdominal tenderness.     Comments: Mild LUQ tenderness- improving from splenic infarct and stent placement.  Musculoskeletal:        General: Normal range of motion.     Cervical back: Normal range of motion and neck supple.  Skin:    General: Skin is warm and dry.  Neurological:     General: No focal deficit present.     Mental Status: He is alert and oriented to person, place, and time.  Psychiatric:        Mood and Affect: Mood normal.        Behavior: Behavior normal.     Assessment & Plan:   Problem List Items Addressed This Visit      Cardiovascular and Mediastinum   Hypertension   Relevant Orders   Referral to Chronic Care Management Services     Respiratory   COPD with chronic bronchitis ([place])     Endocrine   Diabetes mellitus without complication ([place]) - Primary   Relevant Orders   Referral to Chronic Care Management Services   CBC with Differential/Platelet   Basic Metabolic Panel (BMET) (Completed)   Lipid Profile (Completed)   Urine Microalbumin w/creat. ratio     Other   Splenic infarct   Relevant Orders   Referral to Chronic Care Management Services   Hyperlipemia   Relevant Orders   Referral to Chronic Care Management Services   Tobacco abuse   Relevant Orders   Referral to Chronic Care Management Services    Other Visit Diagnoses    Hyperkalemia       Relevant Orders   Basic Metabolic Panel (BMET)      Outpatient Encounter Medications as of 08/11/2019  Medication Sig  .  aspirin EC 81 MG EC tablet Take 1 tablet (81 mg total) by mouth daily.  [name] atorvastatin (LIPITOR) 40 MG tablet Take 1 tablet (40 mg total) by mouth daily.  . blood glucose meter kit and supplies KIT  Dispense based on patient and insurance preference. Use up to four times daily as directed. (FOR ICD-9 250.00, 250.01).  . Blood Glucose Monitoring Suppl (GLUCOCOM BLOOD GLUCOSE MONITOR) DEVI USE TO CHECK BLOOD SUGARS DAILY  . clopidogrel (PLAVIX) 75 MG tablet Take 2 tablets (150 mg total) by mouth daily.  . diphenhydrAMINE (BENADRYL) 25 MG tablet Take 1 tablet (25 mg total) by mouth every 6 (six) hours as needed.  [name] EPINEPHrine 0.3 mg/0.3 mL IJ SOAJ injection Inject 0.3 mLs (0.3 mg total) into the muscle once. Follow package instructions as needed for severe allergy or anaphylactic reaction.  . insulin glargine (LANTUS) 100 UNIT/ML Solostar Pen Inject 20 Units into the skin at bedtime.  . Insulin Pen Needle (PEN NEEDLES) 31G X 5 MM MISC 20 Units by Does not apply route at bedtime.  . metFORMIN (GLUCOPHAGE) 500 MG tablet Take 1 tablet (500 mg total) by mouth 2 (two) times daily with a meal.  . [DISCONTINUED] clopidogrel (PLAVIX) 75 MG tablet Take 2 tablets (150 mg total) by mouth daily.  . [DISCONTINUED] lisinopril (ZESTRIL) 5 MG tablet TAKE 1 TABLET EVERY DAY  . [DISCONTINUED] nicotine (NICODERM CQ - DOSED IN MG/24 HOURS) 21 mg/24hr patch Place 1 patch (21 mg total) onto the skin daily.  . [DISCONTINUED] sildenafil (REVATIO) 20 MG tablet Take 1-5 pills as needed   No facility-administered encounter medications on file as of 08/11/2019.   Please [facility] the [facility] to get the PLAVIX- and take as directed. He has not yet filled this Rx.  He is not interested in changing his eating pattern.  He reports that he has not had hypoglycemic episodes on his current insulin and Metformin regimen.  I have requested consult with the  chronic care management team.  They may be able to help [place] with his chronic health problems.  The patient voices that he does not want to take medication. He is not taking lisinopril.  His potassium was little elevated and we can repeat that.  He reports he does not have  problems with depression or anxiety.  He will need close follow-up for medication compliance.  Discussed the importance of tobacco cessation, benefits of quitting, implications of continued use. Provided patient with information on 1-800-QUIT-NOW .   Please see me in 2 weeks.   Follow-up: Return in about 2 weeks (around 08/25/2019).  This visit occurred during the SARS-CoV-2 public health emergency.  Safety protocols were in place, including screening questions prior to the visit, additional usage of staff PPE, and extensive cleaning of exam room while observing appropriate contact time as indicated for disinfecting solutions.   [name], NP

## 2019-08-11 NOTE — Patient Instructions (Addendum)
Please go to the lab today and we will call you with the results.  Please go to the [facility] to get the PLAVIX- and take as directed.   I have placed a referral to our Chronic care Management team and you will be getting a phone call form them. They will help [place] with smoking and diabetes, BP, cholesterol  management.   Please see me in 2 weeks.  Diabetes Mellitus and Nutrition, Adult When you have diabetes (diabetes mellitus), it is very important to have healthy eating habits because your blood sugar (glucose) levels are greatly affected by what you eat and drink. Eating healthy foods in the appropriate amounts, at about the same times every day, can help you:  Control your blood glucose.  Lower your risk of heart disease.  Improve your blood pressure.  Reach or maintain a healthy weight. Every person with diabetes is different, and each person has different needs for a meal plan. Your health care provider may recommend that you work with a diet and nutrition specialist (dietitian) to make a meal plan that is best for you. Your meal plan may vary depending on factors such as:  The calories you need.  The medicines you take.  Your weight.  Your blood glucose, blood pressure, and cholesterol levels.  Your activity level.  Other health conditions you have, such as heart or kidney disease. How do carbohydrates affect me? Carbohydrates, also called carbs, affect your blood glucose level more than any other type of food. Eating carbs naturally raises the amount of glucose in your blood. Carb counting is a method for keeping track of how many carbs you eat. Counting carbs is important to keep your blood glucose at a healthy level, especially if you use insulin or take certain oral diabetes medicines. It is important to know how many carbs you can safely have in each meal. This is different for every person. Your dietitian can help you calculate how many carbs you should have at each  meal and for each snack. Foods that contain carbs include:  Bread, cereal, rice, pasta, and crackers.  Potatoes and corn.  Peas, beans, and lentils.  Milk and yogurt.  Fruit and juice.  Desserts, such as cakes, cookies, ice cream, and candy. How does alcohol affect me? Alcohol can cause a sudden decrease in blood glucose (hypoglycemia), especially if you use insulin or take certain oral diabetes medicines. Hypoglycemia can be a life-threatening condition. Symptoms of hypoglycemia (sleepiness, dizziness, and confusion) are similar to symptoms of having too much alcohol. If your health care provider says that alcohol is safe for you, follow these guidelines:  Limit alcohol intake to no more than 1 drink per day for nonpregnant women and 2 drinks per day for men. One drink equals 12 oz of beer, 5 oz of wine, or 1 oz of hard liquor.  Do not drink on an empty stomach.  Keep yourself hydrated with water, diet soda, or unsweetened iced tea.  Keep in mind that regular soda, juice, and other mixers may contain a lot of sugar and must be counted as carbs. What are tips for following this plan?  Reading food labels  Start by checking the serving size on the "Nutrition Facts" label of packaged foods and drinks. The amount of calories, carbs, fats, and other nutrients listed on the label is based on one serving of the item. Many items contain more than one serving per package.  Check the total grams (g) of  carbs in one serving. You can calculate the number of servings of carbs in one serving by dividing the total carbs by 15. For example, if a food has 30 g of total carbs, it would be equal to 2 servings of carbs.  Check the number of grams (g) of saturated and trans fats in one serving. Choose foods that have low or no amount of these fats.  Check the number of milligrams (mg) of salt (sodium) in one serving. Most people should limit total sodium intake to less than 2,300 mg per  day.  Always check the nutrition information of foods labeled as "low-fat" or "nonfat". These foods may be higher in added sugar or refined carbs and should be avoided.  Talk to your dietitian to identify your daily goals for nutrients listed on the label. Shopping  Avoid buying canned, premade, or processed foods. These foods tend to be high in fat, sodium, and added sugar.  Shop around the outside edge of the grocery store. This includes fresh fruits and vegetables, bulk grains, fresh meats, and fresh dairy. Cooking  Use low-heat cooking methods, such as baking, instead of high-heat cooking methods like deep frying.  Cook using healthy oils, such as olive, canola, or sunflower oil.  Avoid cooking with butter, cream, or high-fat meats. Meal planning  Eat meals and snacks regularly, preferably at the same times every day. Avoid going long periods of time without eating.  Eat foods high in fiber, such as fresh fruits, vegetables, beans, and whole grains. Talk to your dietitian about how many servings of carbs you can eat at each meal.  Eat 4-6 ounces (oz) of lean protein each day, such as lean meat, chicken, fish, eggs, or tofu. One oz of lean protein is equal to: ? 1 oz of meat, chicken, or fish. ? 1 egg. ?  cup of tofu.  Eat some foods each day that contain healthy fats, such as avocado, nuts, seeds, and fish. Lifestyle  Check your blood glucose regularly.  Exercise regularly as told by your health care provider. This may include: ? 150 minutes of moderate-intensity or vigorous-intensity exercise each week. This could be brisk walking, biking, or water aerobics. ? Stretching and doing strength exercises, such as yoga or weightlifting, at least 2 times a week.  Take medicines as told by your health care provider.  Do not use any products that contain nicotine or tobacco, such as cigarettes and e-cigarettes. If you need help quitting, ask your health care provider.  Work with  a Social worker or diabetes educator to identify strategies to manage stress and any emotional and social challenges. Questions to ask a health care provider  Do I need to meet with a diabetes educator?  Do I need to meet with a dietitian?  What number can I call if I have questions?  When are the best times to check my blood glucose? Where to find more information:  American Diabetes Association: diabetes.org  Academy of Nutrition and Dietetics: www.eatright.[employer] of Diabetes and Digestive and Kidney Diseases (NIH): [employer] Summary  A healthy meal plan will help you control your blood glucose and maintain a healthy lifestyle.  Working with a diet and nutrition specialist (dietitian) can help you make a meal plan that is best for you.  Keep in mind that carbohydrates (carbs) and alcohol have immediate effects on your blood glucose levels. It is important to count carbs and to use alcohol carefully. This information is not intended to replace  advice given to you by your health care provider. Make sure you discuss any questions you have with your health care provider. Document Revised: 02/07/2017 Document Reviewed: 04/01/2016 Elsevier Patient Education  2020 [employer].  Steps to Quit Smoking Smoking tobacco is the leading cause of preventable death. It can affect almost every organ in the body. Smoking puts you and those around you at risk for developing many serious chronic diseases. Quitting smoking can be difficult, but it is one of the best things that you can do for your health. It is never too late to quit. How do I get ready to quit? When you decide to quit smoking, create a plan to help you succeed. Before you quit:  Pick a date to quit. Set a date within the next 2 weeks to give you time to prepare.  Write down the reasons why you are quitting. Keep this list in places where you will see it often.  Tell your family, friends, and co-workers that  you are quitting. Support from your loved ones can make quitting easier.  Talk with your health care provider about your options for quitting smoking.  Find out what treatment options are covered by your health insurance.  Identify people, places, things, and activities that make you want to smoke (triggers). Avoid them. What first steps can I take to quit smoking?  Throw away all cigarettes at home, at work, and in your car.  Throw away smoking accessories, such as Scientist, research (medical).  Clean your car. Make sure to empty the ashtray.  Clean your home, including curtains and carpets. What strategies can I use to quit smoking? Talk with your health care provider about combining strategies, such as taking medicines while you are also receiving in-person counseling. Using these two strategies together makes you more likely to succeed in quitting than if you used either strategy on its own.  If you are pregnant or breastfeeding, talk with your health care provider about finding counseling or other support strategies to quit smoking. Do not take medicine to help you quit smoking unless your health care provider tells you to do so. To quit smoking: Quit right away  Quit smoking completely, instead of gradually reducing how much you smoke over a period of time. Research shows that stopping smoking right away is more successful than gradually quitting.  Attend in-person counseling to help you build problem-solving skills. You are more likely to succeed in quitting if you attend counseling sessions regularly. Even short sessions of 10 minutes can be effective. Take medicine You may take medicines to help you quit smoking. Some medicines require a prescription and some you can purchase over-the-counter. Medicines may have nicotine in them to replace the nicotine in cigarettes. Medicines may:  Help to stop cravings.  Help to relieve withdrawal symptoms. Your health care provider may  recommend:  Nicotine patches, gum, or lozenges.  Nicotine inhalers or sprays.  Non-nicotine medicine that is taken by mouth. Find resources Find resources and support systems that can help you to quit smoking and remain smoke-free after you quit. These resources are most helpful when you use them often. They include:  Online chats with a Social worker.  Telephone quitlines.  Printed Furniture conservator/restorer.  Support groups or group counseling.  Text messaging programs.  Mobile phone apps or applications. Use apps that can help you stick to your quit plan by providing reminders, tips, and encouragement. There are many free apps for mobile devices as well as websites. Examples  include Quit Guide from the [employer] and smokefree.gov What things can I do to make it easier to quit?   Reach out to your family and friends for support and encouragement. Call telephone quitlines (1-800-QUIT-NOW), reach out to support groups, or work with a counselor for support.  Ask people who smoke to avoid smoking around you.  Avoid places that trigger you to smoke, such as bars, parties, or smoke-break areas at work.  Spend time with people who do not smoke.  Lessen the stress in your life. Stress can be a smoking trigger for some people. To lessen stress, try: ? Exercising regularly. ? Doing deep-breathing exercises. ? Doing yoga. ? Meditating. ? Performing a body scan. This involves closing your eyes, scanning your body from head to toe, and noticing which parts of your body are particularly tense. Try to relax the muscles in those areas. How will I feel when I quit smoking? Day 1 to 3 weeks Within the first 24 hours of quitting smoking, you may start to feel withdrawal symptoms. These symptoms are usually most noticeable 2-3 days after quitting, but they usually do not last for more than 2-3 weeks. You may experience these symptoms:  Mood swings.  Restlessness, anxiety, or irritability.  Trouble  concentrating.  Dizziness.  Strong cravings for sugary foods and nicotine.  Mild weight gain.  Constipation.  Nausea.  Coughing or a sore throat.  Changes in how the medicines that you take for unrelated issues work in your body.  Depression.  Trouble sleeping (insomnia). Week 3 and afterward After the first 2-3 weeks of quitting, you may start to notice more positive results, such as:  Improved sense of smell and taste.  Decreased coughing and sore throat.  Slower heart rate.  Lower blood pressure.  Clearer skin.  The ability to breathe more easily.  Fewer sick days. Quitting smoking can be very challenging. Do not get discouraged if you are not successful the first time. Some people need to make many attempts to quit before they achieve long-term success. Do your best to stick to your quit plan, and talk with your health care provider if you have any questions or concerns. Summary  Smoking tobacco is the leading cause of preventable death. Quitting smoking is one of the best things that you can do for your health.  When you decide to quit smoking, create a plan to help you succeed.  Quit smoking right away, not slowly over a period of time.  When you start quitting, seek help from your health care provider, family, or friends. This information is not intended to replace advice given to you by your health care provider. Make sure you discuss any questions you have with your health care provider. Document Revised: 11/20/2018 Document Reviewed: 05/16/2018 Elsevier Patient Education  [place].

## 2019-08-12 ENCOUNTER — Encounter: Payer: Self-pay | Admitting: Nurse Practitioner

## 2019-08-13 ENCOUNTER — Other Ambulatory Visit: Payer: Self-pay

## 2019-08-13 ENCOUNTER — Telehealth: Payer: Self-pay | Admitting: Nurse Practitioner

## 2019-08-13 ENCOUNTER — Other Ambulatory Visit (INDEPENDENT_AMBULATORY_CARE_PROVIDER_SITE_OTHER): Payer: Medicare HMO

## 2019-08-13 DIAGNOSIS — E875 Hyperkalemia: Secondary | ICD-10-CM | POA: Diagnosis not present

## 2019-08-13 DIAGNOSIS — E119 Type 2 diabetes mellitus without complications: Secondary | ICD-10-CM

## 2019-08-13 LAB — CBC WITH DIFFERENTIAL/PLATELET
Basophils Absolute: 0.1 10*3/uL (ref 0.0–0.1)
Basophils Relative: 0.8 % (ref 0.0–3.0)
Eosinophils Absolute: 0.1 10*3/uL (ref 0.0–0.7)
Eosinophils Relative: 1.2 % (ref 0.0–5.0)
HCT: 41.7 % (ref 39.0–52.0)
Hemoglobin: 13.7 g/dL (ref 13.0–17.0)
Lymphocytes Relative: 33.2 % (ref 12.0–46.0)
Lymphs Abs: 3.4 10*3/uL (ref 0.7–4.0)
MCHC: 32.9 g/dL (ref 30.0–36.0)
MCV: 90.5 fl (ref 78.0–100.0)
Monocytes Absolute: 0.9 10*3/uL (ref 0.1–1.0)
Monocytes Relative: 8.5 % (ref 3.0–12.0)
Neutro Abs: 5.8 10*3/uL (ref 1.4–7.7)
Neutrophils Relative %: 56.3 % (ref 43.0–77.0)
Platelets: 687 10*3/uL — ABNORMAL HIGH (ref 150.0–400.0)
RBC: 4.61 Mil/uL (ref 4.22–5.81)
RDW: 13.5 % (ref 11.5–15.5)
WBC: 10.3 10*3/uL (ref 4.0–10.5)

## 2019-08-13 LAB — BASIC METABOLIC PANEL
BUN: 12 mg/dL (ref 6–23)
CO2: 31 mEq/L (ref 19–32)
Calcium: 9.8 mg/dL (ref 8.4–10.5)
Chloride: 101 mEq/L (ref 96–112)
Creatinine, Ser: 0.62 mg/dL (ref 0.40–1.50)
GFR: 135.29 mL/min (ref >60.00)
Glucose, Bld: 188 mg/dL — ABNORMAL HIGH (ref 70–99)
Potassium: 4.4 mEq/L (ref 3.5–5.1)
Sodium: 139 mEq/L (ref 135–145)

## 2019-08-13 LAB — MICROALBUMIN / CREATININE URINE RATIO
Creatinine,U: 70.7 mg/dL
Microalb Creat Ratio: 1 mg/g (ref 0.0–30.0)
Microalb, Ur: <0.7 mg/dL (ref 0.0–1.9)

## 2019-08-13 NOTE — Chronic Care Management (AMB) (Signed)
Chronic Care Management   Note  08/13/2019 Name: Keymari Sato MRN: [id number] DOB: 05-Nov-1965  Robley Fries Weissinger is a 54 y.o. year old male who is a primary care patient of [name], NP. I reached out to [employer] by phone today in response to a referral sent by Mr. Rocklin Soderquist Heming's PCP, [name] NP     Mr. Kassa was given information about Chronic Care Management services today including:  1. CCM service includes personalized support from designated clinical staff supervised by his physician, including individualized plan of care and coordination with other care providers 2. 24/7 contact phone numbers for assistance for urgent and routine care needs. 3. Service will only be billed when office clinical staff spend 20 minutes or more in a month to coordinate care. 4. Only one practitioner may furnish and bill the service in a calendar month. 5. The patient may stop CCM services at any time (effective at the end of the month) by phone call to the office staff. 6. The patient will be responsible for cost sharing (co-pay) of up to 20% of the service fee (after annual deductible is met).  Patient agreed to services and verbal consent obtained.   Follow up plan: Telephone appointment with care management team member scheduled for:08/27/2019  Glenna Durand, [facility] ??[email or web address]  ??[phone]

## 2019-08-18 ENCOUNTER — Other Ambulatory Visit: Payer: Self-pay | Admitting: Nurse Practitioner

## 2019-08-18 DIAGNOSIS — D735 Infarction of spleen: Secondary | ICD-10-CM

## 2019-08-18 DIAGNOSIS — D75839 Thrombocytosis, unspecified: Secondary | ICD-10-CM

## 2019-08-18 NOTE — Progress Notes (Signed)
Referral  hematology placed for thrombocytosis, hx of splenic infarct.

## 2019-08-19 ENCOUNTER — Other Ambulatory Visit: Payer: Self-pay

## 2019-08-19 ENCOUNTER — Telehealth: Payer: Self-pay | Admitting: Nurse Practitioner

## 2019-08-19 ENCOUNTER — Encounter: Payer: Self-pay | Admitting: Nurse Practitioner

## 2019-08-19 ENCOUNTER — Ambulatory Visit (INDEPENDENT_AMBULATORY_CARE_PROVIDER_SITE_OTHER): Payer: Medicare HMO | Admitting: Nurse Practitioner

## 2019-08-19 VITALS — Temp 97.5°F | Ht 64.02 in | Wt 175.0 lb

## 2019-08-19 DIAGNOSIS — I471 Supraventricular tachycardia, unspecified: Secondary | ICD-10-CM

## 2019-08-19 DIAGNOSIS — R000 Tachycardia, unspecified: Secondary | ICD-10-CM | POA: Insufficient documentation

## 2019-08-19 DIAGNOSIS — R079 Chest pain, unspecified: Secondary | ICD-10-CM | POA: Insufficient documentation

## 2019-08-19 DIAGNOSIS — I951 Orthostatic hypotension: Secondary | ICD-10-CM | POA: Insufficient documentation

## 2019-08-19 DIAGNOSIS — R42 Dizziness and giddiness: Secondary | ICD-10-CM

## 2019-08-19 DIAGNOSIS — D735 Infarction of spleen: Secondary | ICD-10-CM | POA: Diagnosis not present

## 2019-08-19 HISTORY — DX: Supraventricular tachycardia, unspecified: I47.10

## 2019-08-19 HISTORY — DX: Supraventricular tachycardia: I47.1

## 2019-08-19 LAB — COMPREHENSIVE METABOLIC PANEL
ALT: 20 U/L (ref 0–53)
AST: 14 U/L (ref 0–37)
Albumin: 4.1 g/dL (ref 3.5–5.2)
Alkaline Phosphatase: 98 U/L (ref 39–117)
BUN: 17 mg/dL (ref 6–23)
CO2: 30 mEq/L (ref 19–32)
Calcium: 9.5 mg/dL (ref 8.4–10.5)
Chloride: 100 mEq/L (ref 96–112)
Creatinine, Ser: 0.62 mg/dL (ref 0.40–1.50)
GFR: 135.28 mL/min (ref >60.00)
Glucose, Bld: 192 mg/dL — ABNORMAL HIGH (ref 70–99)
Potassium: 3.9 mEq/L (ref 3.5–5.1)
Sodium: 137 mEq/L (ref 135–145)
Total Bilirubin: 0.2 mg/dL (ref 0.2–1.2)
Total Protein: 7.4 g/dL (ref 6.0–8.3)

## 2019-08-19 LAB — CBC WITH DIFFERENTIAL/PLATELET
Basophils Absolute: 0.1 10*3/uL (ref 0.0–0.1)
Basophils Relative: 0.8 % (ref 0.0–3.0)
Eosinophils Absolute: 0.1 10*3/uL (ref 0.0–0.7)
Eosinophils Relative: 0.8 % (ref 0.0–5.0)
HCT: 35.9 % — ABNORMAL LOW (ref 39.0–52.0)
Hemoglobin: 11.9 g/dL — ABNORMAL LOW (ref 13.0–17.0)
Lymphocytes Relative: 31.8 % (ref 12.0–46.0)
Lymphs Abs: 3.8 10*3/uL (ref 0.7–4.0)
MCHC: 33.2 g/dL (ref 30.0–36.0)
MCV: 90.7 fl (ref 78.0–100.0)
Monocytes Absolute: 1.1 10*3/uL — ABNORMAL HIGH (ref 0.1–1.0)
Monocytes Relative: 9.6 % (ref 3.0–12.0)
Neutro Abs: 6.8 10*3/uL (ref 1.4–7.7)
Neutrophils Relative %: 57 % (ref 43.0–77.0)
Platelets: 659 10*3/uL — ABNORMAL HIGH (ref 150.0–400.0)
RBC: 3.96 Mil/uL — ABNORMAL LOW (ref 4.22–5.81)
RDW: 13.4 % (ref 11.5–15.5)
WBC: 12 10*3/uL — ABNORMAL HIGH (ref 4.0–10.5)

## 2019-08-19 NOTE — Patient Instructions (Addendum)
We have  ordered a brain scan for your dizziness and lightheadedness.    We have ordered you to see cardiology as well for your fast heart rate, dizziness lightheadedness and mild intermittent left chest discomfort.  Please go to our laboratory after this for blood work.  I will call you with results and further instructions pending results.  Go home, take it easy, do not do any exertional or heavy work especially outdoors.  Try to cut back on tobacco use.  We can discuss nicotine patch in the near future.  If you develop shortness of breath, chest pain, worsening dizziness or lightheadedness, headache,  or any new symptoms of concern please call 911 for emergency care.      Dizziness Dizziness is a common problem. It is a feeling of unsteadiness or light-headedness. You may feel like you are about to faint. Dizziness can lead to injury if you stumble or fall. Anyone can become dizzy, but dizziness is more common in older adults. This condition can be caused by a number of things, including medicines, dehydration, or illness. Follow these instructions at home: Eating and drinking  Drink enough fluid to keep your urine clear or pale yellow. This helps to keep you from becoming dehydrated. Try to drink more clear fluids, such as water.  Do not drink alcohol.  Limit your caffeine intake if told to do so by your health care provider. Check ingredients and nutrition facts to see if a food or beverage contains caffeine.  Limit your salt (sodium) intake if told to do so by your health care provider. Check ingredients and nutrition facts to see if a food or beverage contains sodium. Activity  Avoid making quick movements. ? Rise slowly from chairs and steady yourself until you feel okay. ? In the morning, first sit up on the side of the bed. When you feel okay, stand slowly while you hold onto something until you know that your balance is fine.  If you need to stand in one place for a long  time, move your legs often. Tighten and relax the muscles in your legs while you are standing.  Do not drive or use heavy machinery if you feel dizzy.  Avoid bending down if you feel dizzy. Place items in your home so that they are easy for you to reach without leaning over. Lifestyle  Do not use any products that contain nicotine or tobacco, such as cigarettes and e-cigarettes. If you need help quitting, ask your health care provider.  Try to reduce your stress level by using methods such as yoga or meditation. Talk with your health care provider if you need help to manage your stress. General instructions  Watch your dizziness for any changes.  Take over-the-counter and prescription medicines only as told by your health care provider. Talk with your health care provider if you think that your dizziness is caused by a medicine that you are taking.  Tell a friend or a family member that you are feeling dizzy. If he or she notices any changes in your behavior, have this person call your health care provider.  Keep all follow-up visits as told by your health care provider. This is important. Contact a health care provider if:  Your dizziness does not go away.  Your dizziness or light-headedness gets worse.  You feel nauseous.  You have reduced hearing.  You have new symptoms.  You are unsteady on your feet or you feel like the room is spinning. Get  help right away if:  You vomit or have diarrhea and are unable to eat or drink anything.  You have problems talking, walking, swallowing, or using your arms, hands, or legs.  You feel generally weak.  You are not thinking clearly or you have trouble forming sentences. It may take a friend or family member to notice this.  You have chest pain, abdominal pain, shortness of breath, or sweating.  Your vision changes.  You have any bleeding.  You have a severe headache.  You have neck pain or a stiff neck.  You have a  fever. These symptoms may represent a serious problem that is an emergency. Do not wait to see if the symptoms will go away. Get medical help right away. Call your local emergency services (911 in the [place] Do not drive yourself to the hospital. Summary  Dizziness is a feeling of unsteadiness or light-headedness. This condition can be caused by a number of things, including medicines, dehydration, or illness.  Anyone can become dizzy, but dizziness is more common in older adults.  Drink enough fluid to keep your urine clear or pale yellow. Do not drink alcohol.  Avoid making quick movements if you feel dizzy. Monitor your dizziness for any changes. This information is not intended to replace advice given to you by your health care provider. Make sure you discuss any questions you have with your health care provider. Document Revised: 02/28/2017 Document Reviewed: 03/30/2016 Elsevier Patient Education  [place].  Nonspecific Chest Pain, Adult Chest pain can be caused by many different conditions. It can be caused by a condition that is life-threatening and requires treatment right away. It can also be caused by something that is not life-threatening. If you have chest pain, it can be hard to know the difference, so it is important to get help right away to make sure that you do not have a serious condition. Some life-threatening causes of chest pain include:  Heart attack.  A tear in the body's main blood vessel (aortic dissection).  Inflammation around your heart (pericarditis).  A problem in the lungs, such as a blood clot (pulmonary embolism) or a collapsed lung (pneumothorax). Some non life-threatening causes of chest pain include:  Heartburn.  Anxiety or stress.  Damage to the bones, muscles, and cartilage that make up your chest wall.  Pneumonia or bronchitis.  Shingles infection (varicella-zoster virus). Chest pain can feel like:  Pain or discomfort on the  surface of your chest or deep in your chest.  Crushing, pressure, aching, or squeezing pain.  Burning or tingling.  Dull or sharp pain that is worse when you move, cough, or take a deep breath.  Pain or discomfort that is also felt in your back, neck, jaw, shoulder, or arm, or pain that spreads to any of these areas. Your chest pain may come and go. It may also be constant. Your health care provider will do lab tests and other studies to find the cause of your pain. Treatment will depend on the cause of your chest pain. Follow these instructions at home: Medicines  Take over-the-counter and prescription medicines only as told by your health care provider.  If you were prescribed an antibiotic, take it as told by your health care provider. Do not stop taking the antibiotic even if you start to feel better. Lifestyle   Rest as directed by your health care provider.  Do not use any products that contain nicotine or tobacco, such as cigarettes and  e-cigarettes. If you need help quitting, ask your health care provider.  Do not drink alcohol.  Make healthy lifestyle choices as recommended. These may include: ? Getting regular exercise. Ask your health care provider to suggest some activities that are safe for you. ? Eating a heart-healthy diet. This includes plenty of fresh fruits and vegetables, whole grains, low-fat (lean) protein, and low-fat dairy products. A dietitian can help you find healthy eating options. ? Maintaining a healthy weight. ? Managing any other health conditions you have, such as high blood pressure (hypertension) or diabetes. ? Reducing stress, such as with yoga or relaxation techniques. General instructions  Pay attention to any changes in your symptoms. Tell your health care provider about them or any new symptoms.  Avoid any activities that cause chest pain.  Keep all follow-up visits as told by your health care provider. This is important. This includes  visits for any further testing if your chest pain does not go away. Contact a health care provider if:  Your chest pain does not go away.  You feel depressed.  You have a fever. Get help right away if:  Your chest pain gets worse.  You have a cough that gets worse, or you cough up blood.  You have severe pain in your abdomen.  You faint.  You have sudden, unexplained chest discomfort.  You have sudden, unexplained discomfort in your arms, back, neck, or jaw.  You have shortness of breath at any time.  You suddenly start to sweat, or your skin gets clammy.  You feel nausea or you vomit.  You suddenly feel lightheaded or dizzy.  You have severe weakness, or unexplained weakness or fatigue.  Your heart begins to beat quickly, or it feels like it is skipping beats. These symptoms may represent a serious problem that is an emergency. Do not wait to see if the symptoms will go away. Get medical help right away. Call your local emergency services (911 in the [place] Do not drive yourself to the hospital. Summary  Chest pain can be caused by a condition that is serious and requires urgent treatment. It may also be caused by something that is not life-threatening.  If you have chest pain, it is very important to see your health care provider. Your health care provider may do lab tests and other studies to find the cause of your pain.  Follow your health care provider's instructions on taking medicines, making lifestyle changes, and getting emergency treatment if symptoms become worse.  Keep all follow-up visits as told by your health care provider. This includes visits for any further testing if your chest pain does not go away. This information is not intended to replace advice given to you by your health care provider. Make sure you discuss any questions you have with your health care provider. Document Revised: 08/28/2017 Document Reviewed: 08/28/2017 Elsevier Patient Education   [place].  Sinus Tachycardia  Sinus tachycardia is a kind of fast heartbeat. In sinus tachycardia, the heart beats more than 100 times a minute. Sinus tachycardia starts in a part of the heart called the sinus node. Sinus tachycardia may be harmless, or it may be a sign of a serious condition. What are the causes? This condition may be caused by:  Exercise or exertion.  A fever.  Pain.  Loss of body fluids (dehydration).  Severe bleeding (hemorrhage).  Anxiety and stress.  Certain substances, including: ? Alcohol. ? Caffeine. ? Tobacco and nicotine products. ? Cold  medicines. ? Illegal drugs.  Medical conditions including: ? Heart disease. ? An infection. ? An overactive thyroid (hyperthyroidism). ? A lack of red blood cells (anemia). What are the signs or symptoms? Symptoms of this condition include:  A feeling that the heart is beating quickly (palpitations).  Suddenly noticing your heartbeat (cardiac awareness).  Dizziness.  Tiredness (fatigue).  Shortness of breath.  Chest pain.  Nausea.  Fainting. How is this diagnosed? This condition is diagnosed with:  A physical exam.  Other tests, such as: ? Blood tests. ? An electrocardiogram (ECG). This test measures the electrical activity of the heart. ? Ambulatory cardiac monitor. This records your heartbeats for 24 hours or more. You may be referred to a heart specialist (cardiologist). How is this treated? Treatment for this condition depends on the cause or the underlying condition. Treatment may involve:  Treating the underlying condition.  Taking new medicines or changing your current medicines as told by your health care provider.  Making changes to your diet or lifestyle. Follow these instructions at home: Lifestyle   Do not use any products that contain nicotine or tobacco, such as cigarettes and e-cigarettes. If you need help quitting, ask your health care provider.  Do not  use illegal drugs, such as cocaine.  Learn relaxation methods to help you when you get stressed or anxious. These include deep breathing.  Avoid caffeine or other stimulants. Alcohol use   Do not drink alcohol if: ? Your health care provider tells you not to drink. ? You are pregnant, may be pregnant, or are planning to become pregnant.  If you drink alcohol, limit how much you have: ? 0-1 drink a day for women. ? 0-2 drinks a day for men.  Be aware of how much alcohol is in your drink. In the [place] one drink equals one typical bottle of beer (12 oz), one-half glass of wine (5 oz), or one shot of hard liquor (1 oz). General instructions  Drink enough fluids to keep your urine pale yellow.  Take over-the-counter and prescription medicines only as told by your health care provider.  Keep all follow-up visits as told by your health care provider. This is important. Contact a health care provider if you have:  A fever.  Vomiting or diarrhea that does not go away. Get help right away if you:  Have pain in your chest, upper arms, jaw, or neck.  Become weak or dizzy.  Feel faint.  Have palpitations that do not go away. Summary  In sinus tachycardia, the heart beats more than 100 times a minute.  Sinus tachycardia may be harmless, or it may be a sign of a serious condition.  Treatment for this condition depends on the cause or the underlying condition.  Get help right away if you have pain in your chest, upper arms, jaw, or neck. This information is not intended to replace advice given to you by your health care provider. Make sure you discuss any questions you have with your health care provider. Document Revised: 04/16/2017 Document Reviewed: 04/16/2017 Elsevier Patient Education  [place].

## 2019-08-19 NOTE — Progress Notes (Signed)
Established Patient Office Visit  Subjective:  Patient ID: Benjamin Conley, male    DOB: [birth year]  Age: 54 y.o. MRN: [id number]  CC:  Chief Complaint  Patient presents with  . Medication Check    HPI Benjamin Conley is a 54 year old male with T2DM, COPD, TOB, HLD,  and recent splenic infarct admission 08/01/2019 through 08/07/2019.  He underwent angiography by Dr. [name], and a successful stent was placed at the stenosis of the distal splenic artery.  He was discharged home on aspirin and Plavix.   He established care at this office on 08/11/2019 and he was not taking the Plavix.  I re-ordered it and made sure it was  pre-authorized and ready for pick up. He started Plavix  and has been on ASA 81 mg daily.  Laboratory studies reveal a rising platelet count and he has appointment with Dr. Grayland Ormond next Friday.  He telephoned in the office today reporting onset of dizziness and lightheadedness that started about 3 to 4 days ago.  He reports it came on  him suddenly and occurs if he is sitting,  or just walking, and especially if he bends over. When he bends over and lifts his head up, he feels like he could pass out.  Yesterday, he felt like his eyeballs had a lot of pressure and like they were going to pop out of his eyes. He does not have a headache.   He does have a blood pressure cuff at home and checked his blood pressure reporting 120/70-80 range.  His heart rate has been higher at home around  100-120.  His baseline heart rate is in the 80s.  He does check his blood sugars and they have been running around 150s, on new start Lantus insulin and Metformin.  He has had no hypoglycemic episodes.  He has had no fevers or chills.  He does not feel systemically ill.  He has had no unusual cough,  or sputum production.   Orthostatic vital signs obtained: Supine:  118/82 ,  heart rate 110  Sitting:    120/72,   heart rate 119,  Standing: 122/68, heart rate 122. Pulse oximetry 98%  He is  getting infrequent left chest tightness off and on -has little pressure but lasts a few minutes and resolves.  This is nonexertional.  He thinks this started just over the last few days as well.  Feels like may be a muscle spasm.  He has noted no unusual shortness of breath or DOE.  He has been a heavy lifelong smoker, and reports he is at his baseline.  He has noted no leg pain or swelling.  He has had no prolonged car rides or sitting.  He denies GERD symptoms.  No nausea or vomiting.  He denies headache, blurred vision, extremity numbness, tingling or weakness.   He has noted no difficulty with speech or with his thought processes.  He has had no seizure activity.   He does have mild constipation, and straining to have a stool on Monday saw blood smear on the brown stool and small blood streak on  the toilet tissue.  No blood in the commode.  He has not had a bowel movement since.  He denies any abdominal pain, cramps, or discomfort.  He is eating a normal diet and says he is hydrated. His LUQ abdominal pain from the spleen infarct/stent is getting better- less discomfort.   BP Readings from Last 3 Encounters:  08/11/19 120/82  08/07/19 136/90  05/25/18 (!) 154/84   Pulse Readings from Last 3 Encounters:  08/11/19 80  08/07/19 88  05/25/18 (!) 115    Past Medical History:  Diagnosis Date  . COPD (chronic obstructive pulmonary disease) ([place])   . Diabetes mellitus without complication ([place])   . Hyperlipemia   . 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 Tobacco abuse     Past Surgical History:  Procedure Laterality Date  . BACK SURGERY     lumbar  . STENT PLACEMENT VASCULAR ([place] [facility])  07/2019   stenosis of distal splenic artery and stent placed  . VISCERAL ANGIOGRAPHY N/A 08/06/2019   Procedure: VISCERAL ANGIOGRAPHY;  Surgeon: [name], MD;  Location: [facility];  Service: Cardiovascular;  Laterality: N/A;    Family History  Problem Relation Age of Onset  . Diabetes  Mellitus II Mother   . Diabetes Mellitus II Brother   . Hyperlipidemia Brother     Social History   Socioeconomic History  . Marital status: Single    Spouse name: Not on file  . Number of children: Not on file  . Years of education: Not on file  . Highest education level: Not on file  Occupational History  . Not on file  Tobacco Use  . Smoking status: Current Every Day Smoker    Packs/day: 2.00    Years: 48.00    Pack years: 96.00    Types: Cigarettes  . Smokeless tobacco: Never Used  Vaping Use  . Vaping Use: Never used  Substance and Sexual Activity  . Alcohol use: No    Comment: Never been a problem  . Drug use: No  . Sexual activity: Yes  Other Topics Concern  . Not on file  Social History Narrative   Single, lives alone, unemployed.   Social Determinants of Health   Financial Resource Strain:   . Difficulty of Paying Living Expenses:   Food Insecurity:   . Worried About Charity fundraiser in the Last Year:   . Arboriculturist in the Last Year:   Transportation Needs:   . Film/video editor (Medical):   [name] Lack of Transportation (Non-Medical):   Physical Activity:   . Days of Exercise per Week:   . Minutes of Exercise per Session:   Stress:   . Feeling of Stress :   Social Connections:   . Frequency of Communication with Friends and Family:   . Frequency of Social Gatherings with Friends and Family:   . Attends Religious Services:   . Active Member of Clubs or Organizations:   . Attends Archivist Meetings:   [name] Marital Status:   Intimate Partner Violence:   . Fear of Current or Ex-Partner:   . Emotionally Abused:   [name] Physically Abused:   . Sexually Abused:     Outpatient Medications Prior to Visit  Medication Sig Dispense Refill  . aspirin EC 81 MG EC tablet Take 1 tablet (81 mg total) by mouth daily. 90 tablet 1  . atorvastatin (LIPITOR) 40 MG tablet Take 1 tablet (40 mg total) by mouth daily. 90 tablet 1  . blood glucose meter  kit and supplies KIT Dispense based on patient and insurance preference. Use up to four times daily as directed. (FOR ICD-9 250.00, 250.01). 1 each 0  . Blood Glucose Monitoring Suppl (GLUCOCOM BLOOD GLUCOSE MONITOR) DEVI USE TO CHECK BLOOD SUGARS DAILY    . clopidogrel (PLAVIX) 75 MG tablet Take 2 tablets (  150 mg total) by mouth daily. 90 tablet 1  . diphenhydrAMINE (BENADRYL) 25 MG tablet Take 1 tablet (25 mg total) by mouth every 6 (six) hours as needed. 30 tablet 0  . EPINEPHrine 0.3 mg/0.3 mL IJ SOAJ injection Inject 0.3 mLs (0.3 mg total) into the muscle once. Follow package instructions as needed for severe allergy or anaphylactic reaction. 1 Device 2  . insulin glargine (LANTUS) 100 UNIT/ML Solostar Pen Inject 20 Units into the skin at bedtime. 15 mL 11  . Insulin Pen Needle (PEN NEEDLES) 31G X 5 MM MISC 20 Units by Does not apply route at bedtime. 100 each 2  . metFORMIN (GLUCOPHAGE) 500 MG tablet Take 1 tablet (500 mg total) by mouth 2 (two) times daily with a meal. 60 tablet 3  . RELION TRUE METRIX TEST STRIPS test strip      No facility-administered medications prior to visit.    Allergies  Allergen Reactions  . Wasp Venom Anaphylaxis  . Wasp Venom Protein Anaphylaxis  . Coffee Flavor Nausea And Vomiting    Patient states coffee gives him nausea and stomach cramps  . Onion     Review of systems obtained and pertinent positives noted in history of present illness otherwise negative.   Objective:      Temp (!) 97.5 F (36.4 C)   Ht 5' 4.02" (1.626 m)   Wt 175 lb (79.4 kg)   SpO2 98%   BMI 30.02 kg/m    Physical Exam Vitals reviewed.  Constitutional:      General: He is not in acute distress.    Appearance: Normal appearance. He is normal weight.  HENT:     Head: Normocephalic.     Right Ear: Tympanic membrane normal.     Left Ear: Tympanic membrane normal.     Ears:     Comments: Some scattered cerum noted bilateral, not impacted. Eyes:     Extraocular  Movements: Extraocular movements intact.     Conjunctiva/sclera: Conjunctivae normal.     Pupils: Pupils are equal, round, and reactive to light.  Cardiovascular:     Rate and Rhythm: Regular rhythm. Tachycardia present.     Pulses: Normal pulses.     Heart sounds: Normal heart sounds. No murmur heard.      Comments: No Lext edema, calves are soft and non tender- not enlarged. Normal lower ext.  Pulmonary:     Effort: Pulmonary effort is normal.     Breath sounds: Normal breath sounds. No wheezing or rales.  Abdominal:     Palpations: Abdomen is soft.     Comments: Very slight LUQ tenderness today- improved from last visit. Soft and non tender otherwise.   Musculoskeletal:        General: Normal range of motion.     Cervical back: Normal range of motion and neck supple.     Right lower leg: No edema.     Left lower leg: No edema.  Skin:    General: Skin is warm and dry.  Neurological:     General: No focal deficit present.     Mental Status: He is alert and oriented to person, place, and time.     Cranial Nerves: No cranial nerve deficit.     Sensory: No sensory deficit.     Motor: No weakness.     Coordination: Coordination normal.     Gait: Gait normal.  Psychiatric:        Mood and Affect: Mood normal.  Behavior: Behavior normal.     Health Maintenance Due  Topic Date Due  . Hepatitis C Screening  Never done  . PNEUMOCOCCAL POLYSACCHARIDE VACCINE AGE 60-64 HIGH RISK  Never done  . FOOT EXAM  Never done  . OPHTHALMOLOGY EXAM  Never done  . COVID-19 Vaccine (1) Never done  . COLONOSCOPY  Never done    There are no preventive care reminders to display for this patient.  No results found for: TSH Lab Results  Component Value Date   WBC 12.0 (H) 08/19/2019   HGB 11.9 (L) 08/19/2019   HCT 35.9 (L) 08/19/2019   MCV 90.7 08/19/2019   PLT 659.0 (H) 08/19/2019   Lab Results  Component Value Date   NA 137 08/19/2019   K 3.9 08/19/2019   CO2 30 08/19/2019    GLUCOSE 192 (H) 08/19/2019   BUN 17 08/19/2019   CREATININE 0.62 08/19/2019   BILITOT 0.2 08/19/2019   ALKPHOS 98 08/19/2019   AST 14 08/19/2019   ALT 20 08/19/2019   PROT 7.4 08/19/2019   ALBUMIN 4.1 08/19/2019   CALCIUM 9.5 08/19/2019   ANIONGAP 6 08/07/2019   GFR 135.28 08/19/2019   Lab Results  Component Value Date   CHOL 175 08/11/2019   Lab Results  Component Value Date   HDL 30.80 (L) 08/11/2019   Lab Results  Component Value Date   LDLCALC 108 (H) 08/11/2019   Lab Results  Component Value Date   TRIG 184.0 (H) 08/11/2019   Lab Results  Component Value Date   CHOLHDL 6 08/11/2019   Lab Results  Component Value Date   HGBA1C 11.3 (H) 08/01/2019      Assessment & Plan:   Problem List Items Addressed This Visit      Other   Splenic infarct - Primary   Relevant Orders   Ambulatory referral to Cardiology   MR Brain Wo Contrast   Dizziness   Relevant Orders   Comp Met (CMET) (Completed)   CBC with Differential/Platelet (Completed)   MR Brain Wo Contrast   Sinus tachycardia   Relevant Orders   Ambulatory referral to Cardiology   Chest pain   Relevant Orders   Ambulatory referral to Cardiology   CT ANGIO CHEST PE W OR WO CONTRAST      No orders of the defined types were placed in this encounter. This case was discussed with Dr. Einar Pheasant in collaboration of care.    Patient Advised:  We have  ordered a brain scan for your dizziness and lightheadedness.    We have ordered you to see cardiology as well for your fast heart rate, dizziness lightheadedness and mild intermittent left chest discomfort.  Please go to our laboratory after this for blood work.  I will call you with results and further instructions pending results.  Go home, take it easy, do not do any exertional or heavy work especially outdoors.  Try to cut back on tobacco use.  We can discuss nicotine patch in the near future.  If you develop shortness of breath, chest pain,  worsening dizziness or lightheadedness, headache,  or any new symptoms of concern please call 911 for emergency care.  Addendum: Laboratory studies return with slightly low hemoglobin 11.9 down from 13.7, WBC 12.0, platelet count says is still elevated but stable 659.  C-Met is unremarkable except for non fasting glucose in diabetic patient 192.  His renal function is normal and he does not appear to be dehydrated.  Patient has  an appointment with hematology next Fri.    I spoke with Dr. [name] who is on-call today and we discussed the case.  Concerning is the patient's tachycardia, intermittent left chest discomfort although he does not endorse worsening shortness of breath, cough, fevers or chills, or exertional component.  We discussed the fact the patient had a delay in starting his post splenic artery stent Plavix therapy.  The patient does not show signs of orthostatic hypotension to explain the tachycardia.  He does not show signs of acute bleeding the abdomen or dehydration.  Decision was made to obtain a CT chest angio tomorrow which will help [place] rule out a PE, pneumonia, atelectasis, and  view some of the spleen as well.  Patient has no abdominal pain and  marked improvement in abdominal tenderness and abdomen exam was unremarkable.  We would expect him to have abdominal pain similar to what he had when he came in for admission if he has developed occlusion of the stent.  He had negative edema, calf tenderness.  Negative carotid bruit.    EKG today showed ST with no acute ST-T changes. Cardiology consult has been placed.  He will require close follow-up.  I was unable to reach him by phone today and [facility] for him to call the office tomorrow.   Follow-up: Return in about 6 days (around 08/25/2019).   This visit occurred during the SARS-CoV-2 public health emergency.  Safety protocols were in place, including screening questions prior to the visit, additional usage of staff PPE, and extensive  cleaning of exam room while observing appropriate contact time as indicated for disinfecting solutions.   [name], NP

## 2019-08-19 NOTE — Telephone Encounter (Signed)
We need to make sure he is taking his meds correctly. If he can't read off the labels and tell you what he is taking- he will need to be seen in person with his medications- all of them -in hand. Can he come in today and see me? I have time.

## 2019-08-19 NOTE — Telephone Encounter (Signed)
Pt started a couple new medication(he was unsure of what they are) the other day and since then he has been having lightheadedness upon standing  and feels like his eyes are going to pop out at times

## 2019-08-19 NOTE — Telephone Encounter (Signed)
Appoinjtment has been scheduled.

## 2019-08-20 ENCOUNTER — Encounter: Payer: Self-pay | Admitting: Nurse Practitioner

## 2019-08-20 ENCOUNTER — Other Ambulatory Visit: Payer: Self-pay | Admitting: Nurse Practitioner

## 2019-08-20 ENCOUNTER — Ambulatory Visit (INDEPENDENT_AMBULATORY_CARE_PROVIDER_SITE_OTHER): Payer: Medicare HMO | Admitting: Cardiology

## 2019-08-20 ENCOUNTER — Ambulatory Visit
Admission: RE | Admit: 2019-08-20 | Discharge: 2019-08-20 | Disposition: A | Discharge status: Home / Self Care | Payer: Medicare HMO | Source: Ambulatory Visit | Attending: Nurse Practitioner | Admitting: Nurse Practitioner

## 2019-08-20 ENCOUNTER — Encounter: Payer: Self-pay | Admitting: Cardiology

## 2019-08-20 ENCOUNTER — Telehealth: Payer: Self-pay | Admitting: Nurse Practitioner

## 2019-08-20 ENCOUNTER — Other Ambulatory Visit (INDEPENDENT_AMBULATORY_CARE_PROVIDER_SITE_OTHER): Payer: Medicare HMO

## 2019-08-20 ENCOUNTER — Ambulatory Visit: Payer: Medicare HMO

## 2019-08-20 VITALS — BP 112/76 | Ht 64.0 in | Wt 175.2 lb

## 2019-08-20 DIAGNOSIS — R0600 Dyspnea, unspecified: Secondary | ICD-10-CM

## 2019-08-20 DIAGNOSIS — J984 Other disorders of lung: Secondary | ICD-10-CM | POA: Diagnosis not present

## 2019-08-20 DIAGNOSIS — K5521 Angiodysplasia of colon with hemorrhage: Secondary | ICD-10-CM | POA: Diagnosis present

## 2019-08-20 DIAGNOSIS — R000 Tachycardia, unspecified: Secondary | ICD-10-CM

## 2019-08-20 DIAGNOSIS — F17210 Nicotine dependence, cigarettes, uncomplicated: Secondary | ICD-10-CM | POA: Diagnosis present

## 2019-08-20 DIAGNOSIS — D62 Acute posthemorrhagic anemia: Secondary | ICD-10-CM | POA: Diagnosis present

## 2019-08-20 DIAGNOSIS — R42 Dizziness and giddiness: Secondary | ICD-10-CM

## 2019-08-20 DIAGNOSIS — D735 Infarction of spleen: Secondary | ICD-10-CM

## 2019-08-20 DIAGNOSIS — R55 Syncope and collapse: Secondary | ICD-10-CM | POA: Diagnosis not present

## 2019-08-20 DIAGNOSIS — R0609 Other forms of dyspnea: Secondary | ICD-10-CM

## 2019-08-20 DIAGNOSIS — Z7902 Long term (current) use of antithrombotics/antiplatelets: Secondary | ICD-10-CM | POA: Diagnosis not present

## 2019-08-20 DIAGNOSIS — Z95820 Peripheral vascular angioplasty status with implants and grafts: Secondary | ICD-10-CM | POA: Diagnosis not present

## 2019-08-20 DIAGNOSIS — Z20822 Contact with and (suspected) exposure to covid-19: Secondary | ICD-10-CM | POA: Diagnosis present

## 2019-08-20 DIAGNOSIS — E7800 Pure hypercholesterolemia, unspecified: Secondary | ICD-10-CM

## 2019-08-20 DIAGNOSIS — J449 Chronic obstructive pulmonary disease, unspecified: Secondary | ICD-10-CM | POA: Diagnosis present

## 2019-08-20 DIAGNOSIS — K922 Gastrointestinal hemorrhage, unspecified: Secondary | ICD-10-CM | POA: Diagnosis present

## 2019-08-20 DIAGNOSIS — I10 Essential (primary) hypertension: Secondary | ICD-10-CM | POA: Diagnosis present

## 2019-08-20 DIAGNOSIS — K31811 Angiodysplasia of stomach and duodenum with bleeding: Secondary | ICD-10-CM | POA: Diagnosis present

## 2019-08-20 DIAGNOSIS — F17200 Nicotine dependence, unspecified, uncomplicated: Secondary | ICD-10-CM

## 2019-08-20 DIAGNOSIS — R079 Chest pain, unspecified: Secondary | ICD-10-CM | POA: Diagnosis not present

## 2019-08-20 DIAGNOSIS — R9089 Other abnormal findings on diagnostic imaging of central nervous system: Secondary | ICD-10-CM

## 2019-08-20 DIAGNOSIS — E1165 Type 2 diabetes mellitus with hyperglycemia: Secondary | ICD-10-CM | POA: Diagnosis present

## 2019-08-20 DIAGNOSIS — IMO0001 Reserved for inherently not codable concepts without codable children: Secondary | ICD-10-CM

## 2019-08-20 DIAGNOSIS — Z833 Family history of diabetes mellitus: Secondary | ICD-10-CM | POA: Diagnosis not present

## 2019-08-20 DIAGNOSIS — E119 Type 2 diabetes mellitus without complications: Secondary | ICD-10-CM | POA: Diagnosis not present

## 2019-08-20 DIAGNOSIS — R0902 Hypoxemia: Secondary | ICD-10-CM | POA: Diagnosis not present

## 2019-08-20 DIAGNOSIS — K640 First degree hemorrhoids: Secondary | ICD-10-CM | POA: Diagnosis present

## 2019-08-20 DIAGNOSIS — D72829 Elevated white blood cell count, unspecified: Secondary | ICD-10-CM

## 2019-08-20 DIAGNOSIS — E0865 Diabetes mellitus due to underlying condition with hyperglycemia: Secondary | ICD-10-CM | POA: Diagnosis not present

## 2019-08-20 DIAGNOSIS — R58 Hemorrhage, not elsewhere classified: Secondary | ICD-10-CM | POA: Diagnosis not present

## 2019-08-20 DIAGNOSIS — I951 Orthostatic hypotension: Secondary | ICD-10-CM | POA: Diagnosis present

## 2019-08-20 DIAGNOSIS — Q2733 Arteriovenous malformation of digestive system vessel: Secondary | ICD-10-CM | POA: Diagnosis not present

## 2019-08-20 DIAGNOSIS — I471 Supraventricular tachycardia: Secondary | ICD-10-CM

## 2019-08-20 DIAGNOSIS — Z83438 Family history of other disorder of lipoprotein metabolism and other lipidemia: Secondary | ICD-10-CM | POA: Diagnosis not present

## 2019-08-20 DIAGNOSIS — K635 Polyp of colon: Secondary | ICD-10-CM | POA: Diagnosis present

## 2019-08-20 DIAGNOSIS — E785 Hyperlipidemia, unspecified: Secondary | ICD-10-CM | POA: Diagnosis present

## 2019-08-20 DIAGNOSIS — Z79899 Other long term (current) drug therapy: Secondary | ICD-10-CM | POA: Diagnosis not present

## 2019-08-20 DIAGNOSIS — K5520 Angiodysplasia of colon without hemorrhage: Secondary | ICD-10-CM | POA: Diagnosis not present

## 2019-08-20 DIAGNOSIS — K921 Melena: Secondary | ICD-10-CM | POA: Diagnosis not present

## 2019-08-20 DIAGNOSIS — I4711 Inappropriate sinus tachycardia, so stated: Secondary | ICD-10-CM

## 2019-08-20 DIAGNOSIS — Z7982 Long term (current) use of aspirin: Secondary | ICD-10-CM | POA: Diagnosis not present

## 2019-08-20 DIAGNOSIS — Z794 Long term (current) use of insulin: Secondary | ICD-10-CM | POA: Diagnosis not present

## 2019-08-20 DIAGNOSIS — E876 Hypokalemia: Secondary | ICD-10-CM | POA: Diagnosis present

## 2019-08-20 LAB — TSH: TSH: 1.68 u[IU]/mL (ref 0.35–4.50)

## 2019-08-20 MED ORDER — METOPROLOL SUCCINATE ER 25 MG PO TB24
25.0000 mg | ORAL_TABLET | Freq: Every day | ORAL | 3 refills | Status: DC
Start: 2019-08-20 — End: 2020-04-17

## 2019-08-20 NOTE — Progress Notes (Signed)
Tried to call Dr. Henrine Screws office twice and did not get an answer either time. Office number is [phone]

## 2019-08-20 NOTE — Telephone Encounter (Signed)
I called him with the results of the brain MRI. He read it and his questions were answered.He agrees to Neurology referral.   CXR PA/LAT today at [facility] for his tachycardia and mild leukocytosis.   For dizziness: MRI showed normal sinuses, normal orbitals, no acute brain changes to explain.  He does  report today that he has a splashing and  ringing in his ears and that has been present for the last 3 to 4 months. This is a/w positional vertigo.   I advised over-the-counter Debrox to use as package instructed and we will examine his ears next week.  If he needs irrigation we can do that.  He did not have a cerumen impaction noted, but there may be not wax that is troubling him.  If no improvement, consider ENT referral.

## 2019-08-20 NOTE — Progress Notes (Signed)
I would like to speak with Dr. Henrine Screws office staff to give him a patient update and to discuss next plan of care.   PLAN: Dr. Ronalee Belts is in agreement with plans to start with a CXR as not overly concerned with a PE.

## 2019-08-20 NOTE — Progress Notes (Signed)
Adding TSH to his labs from yesterday for tachycardia per Cardiology recommendation.

## 2019-08-20 NOTE — Progress Notes (Signed)
Cardiology Office Note:    Date:  08/20/2019   ID:  Benjamin Conley, DOB [birth year], MRN [id number]  PCP:  Benjamin Regal, NP  [facility] Cardiologist:  Benjamin Sable, MD  Lead Electrophysiologist:  None   Referring MD: Benjamin Regal, NP   Chief Complaint  Patient presents with   New Patient (Initial Visit)    Hospital F/U-Splenic infarct. Patient also reports chest pain/tachycardia/dizziness; Meds verbally reviewed with patient.    History of Present Illness:    Benjamin Conley is a 54 y.o. male with a hx of diabetes, splenic infarct status post stent placement, COPD, hyperlipidemia, current smoker x40+ years who presents due to dizziness, shortness of breath and chest pain.  Patient had worsening left upper quadrant abdominal pain.  He presented to the ED about 2 weeks ago where CT abdomen showed distal splenic aneurysm, obstruction, dissection.  He was diagnosed with splenic infarct and had a stent placed to the splenic artery.  His abdominal pain symptoms have improved somewhat, he has occasional left-sided chest discomfort which he attributes to the splenic infarct.  He states having shortness of breath with exertion which is ongoing for some time now.  He attributes the shortness of breath to COPD.  He is a current smoker.  Patient also notes dizziness over the past 3 to 4 days.  Symptoms usually occur when he bends down and rises.  Or standing up from a seated position.  Denies palpitations or irregular heartbeats.  States his heart rates have been elevated over the past 2 to 3 weeks, up to 120 bpm upon checks at home.  Past Medical History:  Diagnosis Date   COPD (chronic obstructive pulmonary disease) ([place])    Diabetes mellitus without complication ([place])    Hyperlipemia    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   Tobacco abuse     Past Surgical History:  Procedure Laterality Date   BACK SURGERY     lumbar   STENT PLACEMENT VASCULAR ([place] [facility])   07/2019   stenosis of distal splenic artery and stent placed   VISCERAL ANGIOGRAPHY N/A 08/06/2019   Procedure: VISCERAL ANGIOGRAPHY;  Surgeon: Benjamin Cabal, MD;  Location: [facility];  Service: Cardiovascular;  Laterality: N/A;    Current Medications: Current Meds  Medication Sig   aspirin EC 81 MG EC tablet Take 1 tablet (81 mg total) by mouth daily.   atorvastatin (LIPITOR) 40 MG tablet Take 1 tablet (40 mg total) by mouth daily.   blood glucose meter kit and supplies KIT Dispense based on patient and insurance preference. Use up to four times daily as directed. (FOR ICD-9 250.00, 250.01).   Blood Glucose Monitoring Suppl (GLUCOCOM BLOOD GLUCOSE MONITOR) DEVI USE TO CHECK BLOOD SUGARS DAILY   clopidogrel (PLAVIX) 75 MG tablet Take 2 tablets (150 mg total) by mouth daily.   diphenhydrAMINE (BENADRYL) 25 MG tablet Take 1 tablet (25 mg total) by mouth every 6 (six) hours as needed.   EPINEPHrine 0.3 mg/0.3 mL IJ SOAJ injection Inject 0.3 mLs (0.3 mg total) into the muscle once. Follow package instructions as needed for severe allergy or anaphylactic reaction.   insulin glargine (LANTUS) 100 UNIT/ML Solostar Pen Inject 20 Units into the skin at bedtime.   Insulin Pen Needle (PEN NEEDLES) 31G X 5 MM MISC 20 Units by Does not apply route at bedtime.   metFORMIN (GLUCOPHAGE) 500 MG tablet Take 1 tablet (500 mg total) by mouth 2 (two) times daily with a  meal.   RELION TRUE METRIX TEST STRIPS test strip      Allergies:   Wasp venom, Wasp venom protein, Coffee flavor, and Onion   Social History   Socioeconomic History   Marital status: Single    Spouse name: Not on file   Number of children: Not on file   Years of education: Not on file   Highest education level: Not on file  Occupational History   Not on file  Tobacco Use   Smoking status: Current Every Day Smoker    Packs/day: 2.00    Years: 48.00    Pack years: 96.00    Types: Cigarettes    Smokeless tobacco: Never Used  Scientific laboratory technician Use: Never used  Substance and Sexual Activity   Alcohol use: No    Comment: Never been a problem   Drug use: No   Sexual activity: Yes  Other Topics Concern   Not on file  Social History Narrative   Single, lives alone, unemployed.   Social Determinants of Health   Financial Resource Strain:    Difficulty of Paying Living Expenses:   Food Insecurity:    Worried About Charity fundraiser in the Last Year:    Arboriculturist in the Last Year:   Transportation Needs:    Film/video editor (Medical):    Lack of Transportation (Non-Medical):   Physical Activity:    Days of Exercise per Week:    Minutes of Exercise per Session:   Stress:    Feeling of Stress :   Social Connections:    Frequency of Communication with Friends and Family:    Frequency of Social Gatherings with Friends and Family:    Attends Religious Services:    Active Member of Clubs or Organizations:    Attends Archivist Meetings:    Marital Status:      Family History: The patient's family history includes Diabetes Mellitus II in his brother and mother; Hyperlipidemia in his brother.  ROS:   Please see the history of present illness.     All other systems reviewed and are negative.  EKGs/Labs/Other Studies Reviewed:    The following studies were reviewed today:   EKG:  EKG is  ordered today.  The ekg ordered today demonstrates sinus tachycardia  Recent Labs: 08/07/2019: Magnesium 2.1 08/19/2019: ALT 20; BUN 17; Creatinine, Ser 0.62; Hemoglobin 11.9; Platelets 659.0; Potassium 3.9; Sodium 137  Recent Lipid Panel    Component Value Date/Time   CHOL 175 08/11/2019 1138   TRIG 184.0 (H) 08/11/2019 1138   HDL 30.80 (L) 08/11/2019 1138   CHOLHDL 6 08/11/2019 1138   VLDL 36.8 08/11/2019 1138   LDLCALC 108 (H) 08/11/2019 1138    Physical Exam:    VS:  BP 112/76 (BP Location: Right Arm, Patient Position: Sitting,  Cuff Size: Normal)    Ht 5' 4"  (1.626 m)    Wt 175 lb 4 oz (79.5 kg)    SpO2 98%    BMI 30.08 kg/m     Wt Readings from Last 3 Encounters:  08/20/19 175 lb 4 oz (79.5 kg)  08/19/19 175 lb (79.4 kg)  08/11/19 173 lb (78.5 kg)     GEN:  Well nourished, well developed in no acute distress HEENT: Normal NECK: No JVD; No carotid bruits LYMPHATICS: No lymphadenopathy CARDIAC: Tachycardic, regular, no murmurs, rubs, gallops RESPIRATORY:  Clear to auscultation without rales, wheezing or rhonchi  ABDOMEN: Soft, non-tender, non-distended MUSCULOSKELETAL:  No edema; No deformity  SKIN: Warm and dry NEUROLOGIC:  Alert and oriented x 3 PSYCHIATRIC:  Normal affect   ASSESSMENT:    1. Dyspnea on exertion   2. Inappropriate sinus tachycardia   3. Pure hypercholesterolemia   4. Smoking   5. Dizziness   6. Chest pain, unspecified type    PLAN:    In order of problems listed above:  1. Patient with dyspnea on exertion and atypical chest pain.  Symptoms could be secondary to COPD, but she has risk factors of diabetes, hyperlipidemia, current smoker.  Will evaluate patient with echocardiogram and Lexiscan Myoview for presence of ischemia. 2. Patient's heart rate elevated up to 114 bpm.  EKG shows sinus tachycardia.  He likely has inappropriate sinus tachycardia.  Recommend thyroid testing by primary care provider.  We will start Toprol-XL 25 mg daily, to help with tachycardia as this could lead to cardiac dysfunction. 3. History of hyperlipidemia continue statin. 4. Patient is a current smoker.  Smoking cessation advised. 5. Patient with history of dizziness, orthostatic vitals in the office today did not show any evidence for orthostasis.  He however felt dizzy upon moving from laying to sitting position and sitting to standing.  Symptoms also alcohol when he bends and raises up.  These are consistent with positional vertigo and not cardiac etiology.  MRI was performed today.  Management as per  primary care/neurology.  Follow-up after echocardiogram and Myoview.  Total encounter time 62 minutes  Greater than 50% was spent in counseling and coordination of care with the patient    Medication Adjustments/Labs and Tests Ordered: Current medicines are reviewed at length with the patient today.  Concerns regarding medicines are outlined above.  Orders Placed This Encounter  Procedures   NM Myocar Multi W/Spect W/Wall Motion / EF   EKG 12-Lead   ECHOCARDIOGRAM COMPLETE   Meds ordered this encounter  Medications   metoprolol succinate (TOPROL XL) 25 MG 24 hr tablet    Sig: Take 1 tablet (25 mg total) by mouth daily.    Dispense:  90 tablet    Refill:  3    Patient Instructions  Medication Instructions:  Your physician has recommended you make the following change in your medication:  1. START Metoprolol succinate 25 mg once daily  *If you need a refill on your cardiac medications before your next appointment, please call your pharmacy*   Lab Work: None  If you have labs (blood work) drawn today and your tests are completely normal, you will receive your results only by:  [place] (if you have MyChart) OR  A paper copy in the mail If you have any lab test that is abnormal or we need to change your treatment, we will call you to review the results.   Testing/Procedures:  Your physician has requested that you have an echocardiogram. Echocardiography is a painless test that uses sound waves to create images of your heart. It provides your doctor with information about the size and shape of your heart and how well your hearts chambers and valves are working. This procedure takes approximately one hour. There are no restrictions for this procedure.   [place]  Your caregiver has ordered a Stress Test with nuclear imaging. The purpose of this test is to evaluate the blood supply to your heart muscle. This procedure is referred to as a "Non-Invasive  Stress Test." This is because other than having an IV started in your vein, nothing is inserted or "  invades" your body. Cardiac stress tests are done to find areas of poor blood flow to the heart by determining the extent of coronary artery disease (CAD). Some patients exercise on a treadmill, which naturally increases the blood flow to your heart, while others who are  unable to walk on a treadmill due to physical limitations have a pharmacologic/chemical stress agent called Lexiscan . This medicine will mimic walking on a treadmill by temporarily increasing your coronary blood flow.   Please note: these test may take anywhere between 2-4 hours to complete  PLEASE REPORT TO [place] AT THE FIRST DESK WILL DIRECT YOU WHERE TO GO  Date of Procedure:_____________________________________  Arrival Time for Procedure:______________________________  Instructions regarding medication:   _XX___ : Hold diabetes medication morning of procedure  _XX___:  Hold Metoprolol succinate the morning of procedure  _XX___:  Take only 1/2 dose of insulin night before test. (10 units)  PLEASE NOTIFY THE OFFICE AT LEAST 24 HOURS IN ADVANCE IF YOU ARE UNABLE TO KEEP YOUR APPOINTMENT.  [phone] AND  PLEASE NOTIFY NUCLEAR MEDICINE AT [facility] [place] AT LEAST 24 HOURS IN ADVANCE IF YOU ARE UNABLE TO KEEP YOUR APPOINTMENT. [phone]  How to prepare for your Myoview test:  1. Do not eat or drink after midnight 2. No caffeine for 24 hours prior to test 3. No smoking 24 hours prior to test. 4. Your medication may be taken with water.  If your doctor stopped a medication because of this test, do not take that medication. 5. Ladies, please do not wear dresses.  Skirts or pants are appropriate. Please wear a short sleeve shirt. 6. No perfume, cologne or lotion. 7. Wear comfortable walking shoes.     Follow-Up: At [facility], you and your health needs are our priority.  As part of  our continuing mission to provide you with exceptional heart care, we have created designated Provider Care Teams.  These Care Teams include your primary Cardiologist (physician) and Advanced Practice Providers (APPs -  Physician Assistants and Nurse Practitioners) who all work together to provide you with the care you need, when you need it.   Your next appointment:   Follow up after testing  The format for your next appointment:   In Person  Provider:    You may see Benjamin Sable, MD or one of the following Advanced Practice Providers on your designated Care Team:    Murray Hodgkins, NP  Christell Faith, PA-C  Marrianne Mood, PA-C      Signed, Benjamin Sable, MD  08/20/2019 11:54 AM    [place]

## 2019-08-20 NOTE — Patient Instructions (Addendum)
Medication Instructions:  Your physician has recommended you make the following change in your medication:  1. START Metoprolol succinate 25 mg once daily  *If you need a refill on your cardiac medications before your next appointment, please call your pharmacy*   Lab Work: None  If you have labs (blood work) drawn today and your tests are completely normal, you will receive your results only by: [name] MyChart Message (if you have MyChart) OR . A paper copy in the mail If you have any lab test that is abnormal or we need to change your treatment, we will call you to review the results.   Testing/Procedures:  Your physician has requested that you have an echocardiogram. Echocardiography is a painless test that uses sound waves to create images of your heart. It provides your doctor with information about the size and shape of your heart and how well your heart's chambers and valves are working. This procedure takes approximately one hour. There are no restrictions for this procedure.   [place]  Your caregiver has ordered a Stress Test with nuclear imaging. The purpose of this test is to evaluate the blood supply to your heart muscle. This procedure is referred to as a "Non-Invasive Stress Test." This is because other than having an IV started in your vein, nothing is inserted or "invades" your body. Cardiac stress tests are done to find areas of poor blood flow to the heart by determining the extent of coronary artery disease (CAD). Some patients exercise on a treadmill, which naturally increases the blood flow to your heart, while others who are  unable to walk on a treadmill due to physical limitations have a pharmacologic/chemical stress agent called Lexiscan . This medicine will mimic walking on a treadmill by temporarily increasing your coronary blood flow.   Please note: these test may take anywhere between 2-4 hours to complete  PLEASE REPORT TO [place] AT THE FIRST DESK WILL DIRECT YOU WHERE TO GO  Date of Procedure:_____________________________________  Arrival Time for Procedure:______________________________  Instructions regarding medication:   _XX___ : Hold diabetes medication morning of procedure  _XX___:  Hold Metoprolol succinate the morning of procedure  _XX___:  Take only 1/2 dose of insulin night before test. (10 units)  PLEASE NOTIFY THE OFFICE AT LEAST 24 HOURS IN ADVANCE IF YOU ARE UNABLE TO KEEP YOUR APPOINTMENT.  [phone] AND  PLEASE NOTIFY NUCLEAR MEDICINE AT [facility] AT LEAST 24 HOURS IN ADVANCE IF YOU ARE UNABLE TO KEEP YOUR APPOINTMENT. [phone]  How to prepare for your Myoview test:  1. Do not eat or drink after midnight 2. No caffeine for 24 hours prior to test 3. No smoking 24 hours prior to test. 4. Your medication may be taken with water.  If your doctor stopped a medication because of this test, do not take that medication. 5. Ladies, please do not wear dresses.  Skirts or pants are appropriate. Please wear a short sleeve shirt. 6. No perfume, cologne or lotion. 7. Wear comfortable walking shoes.     Follow-Up: At [facility], you and your health needs are our priority.  As part of our continuing mission to provide you with exceptional heart care, we have created designated Provider Care Teams.  These Care Teams include your primary Cardiologist (physician) and Advanced Practice Providers (APPs -  Physician Assistants and Nurse Practitioners) who all work together to provide you with the care you need, when you need it.  Your next appointment:   Follow up after testing  The format for your next appointment:   In Person  Provider:    You may see [name], MD or one of the following Advanced Practice Providers on your designated Care Team:    Murray Hodgkins, NP  Christell Faith, PA-C  Marrianne Mood, PA-C

## 2019-08-21 ENCOUNTER — Other Ambulatory Visit: Payer: Self-pay

## 2019-08-21 ENCOUNTER — Emergency Department
Admission: EM | Admit: 2019-08-21 | Discharge: 2019-08-21 | Discharge status: Against Medical Advice | Payer: Medicare HMO | Source: Home / Self Care

## 2019-08-21 ENCOUNTER — Inpatient Hospital Stay
Admission: EM | Admit: 2019-08-21 | Discharge: 2019-08-25 | DRG: 378 | Disposition: A | Discharge status: Home / Self Care | Payer: Medicare HMO | Attending: Internal Medicine | Admitting: Internal Medicine

## 2019-08-21 ENCOUNTER — Emergency Department: Payer: Medicare HMO

## 2019-08-21 DIAGNOSIS — K5521 Angiodysplasia of colon with hemorrhage: Secondary | ICD-10-CM | POA: Diagnosis present

## 2019-08-21 DIAGNOSIS — K922 Gastrointestinal hemorrhage, unspecified: Secondary | ICD-10-CM | POA: Diagnosis present

## 2019-08-21 DIAGNOSIS — J4489 Other specified chronic obstructive pulmonary disease: Secondary | ICD-10-CM | POA: Diagnosis present

## 2019-08-21 DIAGNOSIS — Z7982 Long term (current) use of aspirin: Secondary | ICD-10-CM

## 2019-08-21 DIAGNOSIS — E876 Hypokalemia: Secondary | ICD-10-CM | POA: Diagnosis present

## 2019-08-21 DIAGNOSIS — K921 Melena: Secondary | ICD-10-CM

## 2019-08-21 DIAGNOSIS — R42 Dizziness and giddiness: Secondary | ICD-10-CM

## 2019-08-21 DIAGNOSIS — Z95820 Peripheral vascular angioplasty status with implants and grafts: Secondary | ICD-10-CM

## 2019-08-21 DIAGNOSIS — J449 Chronic obstructive pulmonary disease, unspecified: Secondary | ICD-10-CM | POA: Diagnosis present

## 2019-08-21 DIAGNOSIS — K31811 Angiodysplasia of stomach and duodenum with bleeding: Principal | ICD-10-CM | POA: Diagnosis present

## 2019-08-21 DIAGNOSIS — Z833 Family history of diabetes mellitus: Secondary | ICD-10-CM

## 2019-08-21 DIAGNOSIS — K640 First degree hemorrhoids: Secondary | ICD-10-CM | POA: Diagnosis present

## 2019-08-21 DIAGNOSIS — Z7902 Long term (current) use of antithrombotics/antiplatelets: Secondary | ICD-10-CM

## 2019-08-21 DIAGNOSIS — Z95828 Presence of other vascular implants and grafts: Secondary | ICD-10-CM

## 2019-08-21 DIAGNOSIS — E785 Hyperlipidemia, unspecified: Secondary | ICD-10-CM | POA: Diagnosis present

## 2019-08-21 DIAGNOSIS — R55 Syncope and collapse: Secondary | ICD-10-CM

## 2019-08-21 DIAGNOSIS — Z83438 Family history of other disorder of lipoprotein metabolism and other lipidemia: Secondary | ICD-10-CM

## 2019-08-21 DIAGNOSIS — K635 Polyp of colon: Secondary | ICD-10-CM | POA: Diagnosis present

## 2019-08-21 DIAGNOSIS — E1165 Type 2 diabetes mellitus with hyperglycemia: Secondary | ICD-10-CM | POA: Diagnosis present

## 2019-08-21 DIAGNOSIS — F17210 Nicotine dependence, cigarettes, uncomplicated: Secondary | ICD-10-CM | POA: Diagnosis present

## 2019-08-21 DIAGNOSIS — Z20822 Contact with and (suspected) exposure to covid-19: Secondary | ICD-10-CM | POA: Diagnosis present

## 2019-08-21 DIAGNOSIS — Z79899 Other long term (current) drug therapy: Secondary | ICD-10-CM

## 2019-08-21 DIAGNOSIS — Z794 Long term (current) use of insulin: Secondary | ICD-10-CM

## 2019-08-21 DIAGNOSIS — K5520 Angiodysplasia of colon without hemorrhage: Secondary | ICD-10-CM

## 2019-08-21 DIAGNOSIS — I10 Essential (primary) hypertension: Secondary | ICD-10-CM | POA: Diagnosis present

## 2019-08-21 DIAGNOSIS — D62 Acute posthemorrhagic anemia: Secondary | ICD-10-CM

## 2019-08-21 DIAGNOSIS — I951 Orthostatic hypotension: Secondary | ICD-10-CM | POA: Diagnosis present

## 2019-08-21 DIAGNOSIS — E119 Type 2 diabetes mellitus without complications: Secondary | ICD-10-CM

## 2019-08-21 HISTORY — DX: Melena: K92.1

## 2019-08-21 HISTORY — DX: Syncope and collapse: R42

## 2019-08-21 HISTORY — DX: Gastrointestinal hemorrhage, unspecified: K92.2

## 2019-08-21 HISTORY — DX: Syncope and collapse: R55

## 2019-08-21 HISTORY — DX: Other complications of anesthesia, initial encounter: T88.59XA

## 2019-08-21 LAB — BASIC METABOLIC PANEL
Anion gap: 10 (ref 5–15)
BUN: 8 mg/dL (ref 6–20)
CO2: 25 mmol/L (ref 22–32)
Calcium: 8.7 mg/dL — ABNORMAL LOW (ref 8.9–10.3)
Chloride: 101 mmol/L (ref 98–111)
Creatinine, Ser: 0.67 mg/dL (ref 0.61–1.24)
GFR calc Af Amer: >60 mL/min (ref >60)
GFR calc non Af Amer: >60 mL/min (ref >60)
Glucose, Bld: 268 mg/dL — ABNORMAL HIGH (ref 70–99)
Potassium: 3.7 mmol/L (ref 3.5–5.1)
Sodium: 136 mmol/L (ref 135–145)

## 2019-08-21 LAB — HEMOGLOBIN AND HEMATOCRIT, BLOOD
HCT: 31.2 % — ABNORMAL LOW (ref 39.0–52.0)
Hemoglobin: 10.3 g/dL — ABNORMAL LOW (ref 13.0–17.0)

## 2019-08-21 LAB — COMPREHENSIVE METABOLIC PANEL
ALT: 21 U/L (ref 0–44)
AST: 15 U/L (ref 15–41)
Albumin: 3.8 g/dL (ref 3.5–5.0)
Alkaline Phosphatase: 78 U/L (ref 38–126)
Anion gap: 9 (ref 5–15)
BUN: 10 mg/dL (ref 6–20)
CO2: 29 mmol/L (ref 22–32)
Calcium: 9 mg/dL (ref 8.9–10.3)
Chloride: 101 mmol/L (ref 98–111)
Creatinine, Ser: 0.7 mg/dL (ref 0.61–1.24)
GFR calc Af Amer: >60 mL/min (ref >60)
GFR calc non Af Amer: >60 mL/min (ref >60)
Glucose, Bld: 134 mg/dL — ABNORMAL HIGH (ref 70–99)
Potassium: 3.2 mmol/L — ABNORMAL LOW (ref 3.5–5.1)
Sodium: 139 mmol/L (ref 135–145)
Total Bilirubin: 0.6 mg/dL (ref 0.3–1.2)
Total Protein: 7.6 g/dL (ref 6.5–8.1)

## 2019-08-21 LAB — CBC WITH DIFFERENTIAL/PLATELET
Abs Immature Granulocytes: 0.03 10*3/uL (ref 0.00–0.07)
Basophils Absolute: 0.1 10*3/uL (ref 0.0–0.1)
Basophils Relative: 1 %
Eosinophils Absolute: 0.1 10*3/uL (ref 0.0–0.5)
Eosinophils Relative: 1 %
HCT: 34 % — ABNORMAL LOW (ref 39.0–52.0)
Hemoglobin: 11.5 g/dL — ABNORMAL LOW (ref 13.0–17.0)
Immature Granulocytes: 0 %
Lymphocytes Relative: 34 %
Lymphs Abs: 3.2 10*3/uL (ref 0.7–4.0)
MCH: 29.9 pg (ref 26.0–34.0)
MCHC: 33.8 g/dL (ref 30.0–36.0)
MCV: 88.3 fL (ref 80.0–100.0)
Monocytes Absolute: 0.9 10*3/uL (ref 0.1–1.0)
Monocytes Relative: 9 %
Neutro Abs: 5.3 10*3/uL (ref 1.7–7.7)
Neutrophils Relative %: 55 %
Platelets: 650 10*3/uL — ABNORMAL HIGH (ref 150–400)
RBC: 3.85 MIL/uL — ABNORMAL LOW (ref 4.22–5.81)
RDW: 13.2 % (ref 11.5–15.5)
WBC: 9.6 10*3/uL (ref 4.0–10.5)
nRBC: 0 % (ref 0.0–0.2)

## 2019-08-21 LAB — CBC
HCT: 35.1 % — ABNORMAL LOW (ref 39.0–52.0)
Hemoglobin: 11.5 g/dL — ABNORMAL LOW (ref 13.0–17.0)
MCH: 29.9 pg (ref 26.0–34.0)
MCHC: 32.8 g/dL (ref 30.0–36.0)
MCV: 91.4 fL (ref 80.0–100.0)
Platelets: 655 10*3/uL — ABNORMAL HIGH (ref 150–400)
RBC: 3.84 MIL/uL — ABNORMAL LOW (ref 4.22–5.81)
RDW: 13 % (ref 11.5–15.5)
WBC: 15.5 10*3/uL — ABNORMAL HIGH (ref 4.0–10.5)
nRBC: 0 % (ref 0.0–0.2)

## 2019-08-21 LAB — TYPE AND SCREEN
ABO/RH(D): O POS
Antibody Screen: NEGATIVE

## 2019-08-21 LAB — TROPONIN I (HIGH SENSITIVITY)
Troponin I (High Sensitivity): 3 ng/L (ref <18)
Troponin I (High Sensitivity): 4 ng/L (ref <18)

## 2019-08-21 LAB — PROTIME-INR
INR: 1 (ref 0.8–1.2)
Prothrombin Time: 12.8 seconds (ref 11.4–15.2)

## 2019-08-21 LAB — SARS CORONAVIRUS 2 BY RT PCR (HOSPITAL ORDER, PERFORMED IN ~~LOC~~ HOSPITAL LAB): SARS Coronavirus 2: NEGATIVE

## 2019-08-21 LAB — GLUCOSE, CAPILLARY
Glucose-Capillary: 131 mg/dL — ABNORMAL HIGH (ref 70–99)
Glucose-Capillary: 77 mg/dL (ref 70–99)

## 2019-08-21 LAB — APTT: aPTT: 32 seconds (ref 24–36)

## 2019-08-21 MED ORDER — PANTOPRAZOLE SODIUM 40 MG IV SOLR: 40.0000 mg | Freq: Two times a day (BID) | INTRAVENOUS | Status: DC

## 2019-08-21 MED ORDER — SODIUM CHLORIDE 0.9 % IV SOLN
8.0000 mg/h | INTRAVENOUS | Status: DC
  Administered 2019-08-21 – 2019-08-24 (×5): 8 mg/h via INTRAVENOUS
  Filled 2019-08-21 (×6): qty 80

## 2019-08-21 MED ORDER — IOHEXOL 350 MG/ML SOLN
75.0000 mL | Freq: Once | INTRAVENOUS | Status: AC | PRN
  Administered 2019-08-21: 75 mL via INTRAVENOUS

## 2019-08-21 MED ORDER — ACETAMINOPHEN 325 MG PO TABS: 650.0000 mg | ORAL_TABLET | Freq: Four times a day (QID) | ORAL | Status: DC | PRN

## 2019-08-21 MED ORDER — SODIUM CHLORIDE 0.9 % IV SOLN
80.0000 mg | Freq: Once | INTRAVENOUS | Status: AC
  Administered 2019-08-21: 80 mg via INTRAVENOUS
  Filled 2019-08-21: qty 80

## 2019-08-21 MED ORDER — INSULIN ASPART 100 UNIT/ML ~~LOC~~ SOLN
0.0000 [IU] | SUBCUTANEOUS | Status: DC
  Administered 2019-08-21 – 2019-08-22 (×2): 2 [IU] via SUBCUTANEOUS
  Administered 2019-08-22: 3 [IU] via SUBCUTANEOUS
  Administered 2019-08-23: 2 [IU] via SUBCUTANEOUS
  Administered 2019-08-23: 3 [IU] via SUBCUTANEOUS
  Administered 2019-08-23 – 2019-08-24 (×2): 5 [IU] via SUBCUTANEOUS
  Administered 2019-08-25: 2 [IU] via SUBCUTANEOUS
  Administered 2019-08-25: 5 [IU] via SUBCUTANEOUS
  Filled 2019-08-21 (×9): qty 1

## 2019-08-21 MED ORDER — SODIUM CHLORIDE 0.9 % IV SOLN: INTRAVENOUS | Status: DC

## 2019-08-21 MED ORDER — ONDANSETRON HCL 4 MG/2ML IJ SOLN: 4.0000 mg | Freq: Four times a day (QID) | INTRAMUSCULAR | Status: DC | PRN

## 2019-08-21 MED ORDER — MORPHINE SULFATE (PF) 2 MG/ML IV SOLN: 2.0000 mg | INTRAVENOUS | Status: DC | PRN

## 2019-08-21 MED ORDER — HYDROCODONE-ACETAMINOPHEN 5-325 MG PO TABS: 1.0000 | ORAL_TABLET | ORAL | Status: DC | PRN

## 2019-08-21 MED ORDER — ACETAMINOPHEN 650 MG RE SUPP: 650.0000 mg | Freq: Four times a day (QID) | RECTAL | Status: DC | PRN

## 2019-08-21 MED ORDER — ONDANSETRON HCL 4 MG PO TABS: 4.0000 mg | ORAL_TABLET | Freq: Four times a day (QID) | ORAL | Status: DC | PRN

## 2019-08-21 NOTE — ED Triage Notes (Signed)
Patient arrived via EMS, from home, AOx4 and ambulatory. Patient chief complaint is weakness that has been going on for about a week. Patient did recently have a stent placed in the spleen.   Patient states he had several near syncopal episodes today and some black tarry stools with about 2 stools prior having some bright red streaks of blood.

## 2019-08-21 NOTE — H&P (Signed)
History and Physical    Benjamin Conley NID:[id number] DOB: [birth year] DOA: 08/21/2019  PCP: [name], NP   Patient coming from: home I have personally briefly reviewed patient's old medical records in [place]  Chief Complaint: Weakness, black stool, almost passed out, recent stenting spleen  HPI: Benjamin Conley is a 54 y.o. male with medical history significant for diabetes and hypertension, who is status post placement of splenic artery stent on 08/06/2019 by Dr. Franchot Gallo, no on aspirin and Plavix who complains of a 1 week history of weakness and lightheadedness feeling like he was going to pass out.  In the past 24 hours of arrival he had 3 episodes of black stool.  He denies associated abdominal pain or vomiting.  He denies chest pain, shortness of breath or palpitations.  Denies headache, blurred vision or one-sided weakness numbness or tingling.  Denies nausea, vomiting or dysuria.  He saw his cardiologist yesterday with a complaint of weakness and had an MRI of the brain that showed no acute intracranial abnormality but did show some possibly nonspecific findings.   ED Course: On arrival she was slightly tachycardic at 111 but with blood pressure of 162/99 and otherwise normal vitals.  Blood work notable for hemoglobin of 11.5 which is down from 15.6. on 08/13/19.  Stool guaiac was positive.  CT angio of the chest today showed no evidence of PE.  Showed splenic artery stent with mottled up appearance of the spleen consistent with prior infarcts.  Patient was started on Protonix bolus and infusion.  Hospitalist consulted for admission.  Review of Systems: As per HPI otherwise 10 point review of systems negative.    Past Medical History:  Diagnosis Date  . COPD (chronic obstructive pulmonary disease) ([place])   . Diabetes mellitus without complication ([place])   . Hyperlipemia   . 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 Tobacco abuse     Past Surgical History:  Procedure  Laterality Date  . BACK SURGERY     lumbar  . STENT PLACEMENT VASCULAR ([place] HX)  07/2019   stenosis of distal splenic artery and stent placed  . VISCERAL ANGIOGRAPHY N/A 08/06/2019   Procedure: VISCERAL ANGIOGRAPHY;  Surgeon: [name], MD;  Location: [facility];  Service: Cardiovascular;  Laterality: N/A;     reports that he has been smoking cigarettes. He has a 96.00 pack-year smoking history. He has never used smokeless tobacco. He reports that he does not drink alcohol and does not use drugs.  Allergies  Allergen Reactions  . Wasp Venom Anaphylaxis  . Wasp Venom Protein Anaphylaxis  . Coffee Flavor Nausea And Vomiting    Patient states coffee gives him nausea and stomach cramps  . Onion     Family History  Problem Relation Age of Onset  . Diabetes Mellitus II Mother   . Diabetes Mellitus II Brother   . Hyperlipidemia Brother      Prior to Admission medications   Medication Sig Start Date End Date Taking? Authorizing Provider  aspirin EC 81 MG EC tablet Take 1 tablet (81 mg total) by mouth daily. 08/07/19  Yes [name], MD  atorvastatin (LIPITOR) 40 MG tablet Take 1 tablet (40 mg total) by mouth daily. 08/07/19  Yes [name], MD  clopidogrel (PLAVIX) 75 MG tablet Take 2 tablets (150 mg total) by mouth daily. 08/11/19  Yes [name], NP  insulin glargine (LANTUS) 100 UNIT/ML Solostar Pen Inject 20 Units into the skin at bedtime.  08/07/19  Yes [name], MD  metFORMIN (GLUCOPHAGE) 500 MG tablet Take 1 tablet (500 mg total) by mouth 2 (two) times daily with a meal. 08/07/19  Yes [name], MD  metoprolol succinate (TOPROL XL) 25 MG 24 hr tablet Take 1 tablet (25 mg total) by mouth daily. 08/20/19  Yes [name], MD    Physical Exam: Vitals:   08/21/19 1730 08/21/19 1750 08/21/19 1800 08/21/19 1850  BP: 113/78  104/79   Pulse: (!) 101 92 92 96  Resp: (!) 25 (!) 24 10 (!) 21  SpO2: 98% 98% 100% 98%  Weight:      Height:          Vitals:   08/21/19 1730 08/21/19 1750 08/21/19 1800 08/21/19 1850  BP: 113/78  104/79   Pulse: (!) 101 92 92 96  Resp: (!) 25 (!) 24 10 (!) 21  SpO2: 98% 98% 100% 98%  Weight:      Height:          Constitutional: Alert and oriented x 3 . Not in any apparent distress HEENT:      Head: Normocephalic and atraumatic.         Eyes: PERLA, EOMI, Conjunctivae are normal. Sclera is non-icteric.       Mouth/Throat: Mucous membranes are moist.       Neck: Supple with no signs of meningismus. Cardiovascular: Regular rate and rhythm. No murmurs, gallops, or rubs. 2+ symmetrical distal pulses are present . No JVD. No LE edema Respiratory: Respiratory effort normal .Lungs sounds clear bilaterally. No wheezes, crackles, or rhonchi.  Gastrointestinal: Soft, non tender, and non distended with positive bowel sounds. No rebound or guarding. Genitourinary: No CVA tenderness. Musculoskeletal: Nontender with normal range of motion in all extremities. No edema, cyanosis, or erythema of extremities. Neurologic: Normal speech and language. Face is symmetric. Moving all extremities. No gross focal neurologic deficits . Skin: Skin is warm, dry.  No rash or ulcers Psychiatric: Mood and affect are normal Speech and behavior are normal   Labs on Admission: I have personally reviewed following labs and imaging studies  CBC: Recent Labs  Lab 08/19/19 1257 08/21/19 0318 08/21/19 1620  WBC 12.0* 15.5* 9.6  NEUTROABS 6.8  --  5.3  HGB 11.9* 11.5* 11.5*  HCT 35.9* 35.1* 34.0*  MCV 90.7 91.4 88.3  PLT 659.0* 655* 921*   Basic Metabolic Panel: Recent Labs  Lab 08/19/19 1257 08/21/19 0318 08/21/19 1620  NA 137 139 136  K 3.9 3.2* 3.7  CL 100 101 101  CO2 30 29 25   GLUCOSE 192* 134* 268*  BUN 17 10 8   CREATININE 0.62 0.70 0.67  CALCIUM 9.5 9.0 8.7*   GFR: Estimated Creatinine Clearance: 101.7 mL/min (by C-G formula based on SCr of 0.67 mg/dL). Liver Function Tests: Recent Labs  Lab  08/19/19 1257 08/21/19 0318  AST 14 15  ALT 20 21  ALKPHOS 98 78  BILITOT 0.2 0.6  PROT 7.4 7.6  ALBUMIN 4.1 3.8   No results for input(s): LIPASE, AMYLASE in the last 168 hours. No results for input(s): AMMONIA in the last 168 hours. Coagulation Profile: Recent Labs  Lab 08/21/19 1620  INR 1.0   Cardiac Enzymes: No results for input(s): CKTOTAL, CKMB, CKMBINDEX, TROPONINI in the last 168 hours. BNP (last 3 results) No results for input(s): PROBNP in the last 8760 hours. HbA1C: No results for input(s): HGBA1C in the last 72 hours. CBG: No results for input(s): GLUCAP in the last 168  hours. Lipid Profile: No results for input(s): CHOL, HDL, LDLCALC, TRIG, CHOLHDL, LDLDIRECT in the last 72 hours. Thyroid Function Tests: Recent Labs    08/20/19 1452  TSH 1.68   Anemia Panel: No results for input(s): VITAMINB12, FOLATE, FERRITIN, TIBC, IRON, RETICCTPCT in the last 72 hours. Urine analysis:    Component Value Date/Time   COLORURINE YELLOW (A) 08/01/2019 0137   APPEARANCEUR CLEAR (A) 08/01/2019 0137   LABSPEC 1.024 08/01/2019 0137   PHURINE 6.0 08/01/2019 0137   GLUCOSEU >=500 (A) 08/01/2019 0137   HGBUR NEGATIVE 08/01/2019 0137   BILIRUBINUR NEGATIVE 08/01/2019 0137   KETONESUR 20 (A) 08/01/2019 0137   PROTEINUR NEGATIVE 08/01/2019 0137   NITRITE NEGATIVE 08/01/2019 0137   LEUKOCYTESUR NEGATIVE 08/01/2019 0137    Radiological Exams on Admission: DG Chest 2 View  Result Date: 08/21/2019 CLINICAL DATA:  New onset tachycardia and leukocytosis EXAM: CHEST - 2 VIEW COMPARISON:  08/04/2019 CT FINDINGS: The heart size and mediastinal contours are within normal limits. Both lungs are clear. The visualized skeletal structures are unremarkable. IMPRESSION: No active cardiopulmonary disease. Electronically Signed   By: Inez Catalina M.D.   On: 08/21/2019 18:58   CT Angio Chest PE W and/or Wo Contrast  Result Date: 08/21/2019 CLINICAL DATA:  Tachycardia and weakness EXAM: CT  ANGIOGRAPHY CHEST WITH CONTRAST TECHNIQUE: Multidetector CT imaging of the chest was performed using the standard protocol during bolus administration of intravenous contrast. Multiplanar CT image reconstructions and MIPs were obtained to evaluate the vascular anatomy. CONTRAST:  47mL OMNIPAQUE IOHEXOL 350 MG/ML SOLN COMPARISON:  Chest x-ray from the previous day. FINDINGS: Cardiovascular: Thoracic aorta and its branches are within normal limits. No cardiac enlargement is seen. Mild coronary calcifications are noted. The pulmonary artery shows a normal branching pattern. No definitive filling defect to suggest pulmonary embolism is seen. Mediastinum/Nodes: Thoracic inlet is within normal limits. No sizable hilar or mediastinal adenopathy is noted. A few small calcified lymph nodes are noted within the left infrahilar region. The esophagus is air-filled and within normal limits. Lungs/Pleura: The lungs are well aerated bilaterally. No focal infiltrate or sizable effusion is seen. No pneumothorax is seen. No parenchymal nodules are noted. Upper Abdomen: Scattered calcifications are noted within the spleen consistent with prior granulomatous disease. Splenic artery stent is noted. Persistent mottled enhancement of the spleen is noted consistent with the prior infarcts. The remainder of the upper abdomen appears within normal limits. Musculoskeletal: No chest wall abnormality. No acute or significant osseous findings. Review of the MIP images confirms the above findings. IMPRESSION: No evidence of pulmonary emboli. Changes of prior granulomatous disease. Patent splenic artery stent. Persistent mottled enhancement of the spleen is noted consistent with prior infarcts Electronically Signed   By: Inez Catalina M.D.   On: 08/21/2019 18:58   MR Brain Wo Contrast  Result Date: 08/20/2019 CLINICAL DATA:  Dizziness, nonspecific.  Dizziness for 4-5 days. EXAM: MRI HEAD WITHOUT CONTRAST TECHNIQUE: Multiplanar, multiecho pulse  sequences of the brain and surrounding structures were obtained without intravenous contrast. COMPARISON:  None. FINDINGS: Brain: No acute infarct, hemorrhage, or mass lesion is present. Periventricular and scattered subcortical T2 hyperintensities bilaterally are moderately advanced for age. Mild atrophy is present. The ventricles are proportionate to the degree of atrophy. No significant extraaxial fluid collection is present. White matter changes continue into the brainstem. Cerebellum is unremarkable. The internal auditory canals are within normal limits. Vascular: Flow is present in the major intracranial arteries. Skull and upper cervical spine: The craniocervical junction  is normal. Upper cervical spine is within normal limits. Marrow signal is unremarkable. Sinuses/Orbits: The paranasal sinuses and mastoid air cells are clear. The globes and orbits are within normal limits. IMPRESSION: 1. No acute intracranial abnormality. 2. Periventricular and scattered subcortical T2 hyperintensities bilaterally are moderately advanced for age. The finding is nonspecific but can be seen in the setting of chronic microvascular ischemia, a demyelinating process such as multiple sclerosis, vasculitis, complicated migraine headaches, or as the sequelae of a prior infectious or inflammatory process. Electronically Signed   By: San Morelle M.D.   On: 08/20/2019 09:50    EKG: Independently reviewed.   Assessment/Plan Principal Problem: Melena/upper GI bleed Acute blood loss anemia/symptomatic anemia -Patient presents with 1 day history of melanotic stools preceded by postural dizziness.   -Stool for occult blood positive -Type and cross and transfuse -Serial H&H's -Close hemodynamic monitoring -Hold aspirin and Plavix for now -Continue IV Protonix infusion -GI consult    Postural dizziness with presyncope Symptomatic anemia -Presyncope likely related to GI bleed/symptomatic anemia  -Hemoglobin 11.5,  down from 13.7 on 08/13/2019 -Management as outlined above    Presence of arterial stent - splenic artery -Patient underwent splenic artery stent placement on 08/06/2019 by Dr. Franchot Gallo -Currently on aspirin and Plavix which can continue to give another at this time patient stable but will defer to vascular.  Next dose scheduled for in the a.m.    Diabetes mellitus without complication (HCC) -Sliding scale insulin coverage -Hemoglobin A1c on 528 was 11.4 consistent with poor outpatient control    COPD with chronic bronchitis ([place]) -Not acutely exacerbated -DuoNebs as needed    Hypertension -Blood pressure stable.  Continue home antihypertensives  DVT prophylaxis: SCDs given GI bleed Code Status: full code  Family Communication:  none  Disposition Plan: Back to previous home environment Consults called: GI Status:obs    Athena Masse MD [facility]     08/21/2019, 8:12 PM

## 2019-08-21 NOTE — ED Notes (Signed)
Pt states he is going home. Pt encouraged to return for any reason.

## 2019-08-21 NOTE — ED Notes (Signed)
Waiting on protonix from pharmacy.  

## 2019-08-21 NOTE — ED Provider Notes (Signed)
[facility] Emergency Department Provider Note   ____________________________________________   I have reviewed the triage vital signs and the nursing notes.   HISTORY  Chief Complaint Weakness   History limited by: Not Limited   HPI Benjamin Conley is a 54 y.o. male who presents to the emergency department today because of concern for near syncope. The patient states that he has been feeling weak and dizzy for roughly 1 week. He recently underwent renal artery stenting. Says that he has been worked up for the dizziness, had MRI which was negative for acute lesion. Today he felt like he almost passed out twice. Additionally the patient states that he noticed GI bleeding. Says that he has had multiple black bowel movements today. Has started plavix since the stent placement. Had some slight abdominal discomfort however that has resolved.   Records reviewed. Per medical record review patient has a history of COPD, DM, HLD.   Past Medical History:  Diagnosis Date  . COPD (chronic obstructive pulmonary disease) ([place])   . Diabetes mellitus without complication ([place])   . Hyperlipemia   . 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 Tobacco abuse     Patient Active Problem List   Diagnosis Date Noted  . Paroxysmal supraventricular tachycardia ([place]) 08/19/2019  . Dizziness 08/19/2019  . Sinus tachycardia 08/19/2019  . Chest pain 08/19/2019  . 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  . COPD with chronic bronchitis ([place]) 08/01/2019  . Tobacco abuse 08/01/2019  . Diabetes mellitus without complication ([place])   . Hyperlipemia   . Hypertension 11/10/2013    Past Surgical History:  Procedure Laterality Date  . BACK SURGERY     lumbar  . STENT PLACEMENT VASCULAR ([place] HX)  07/2019   stenosis of distal splenic artery and stent placed  . VISCERAL ANGIOGRAPHY N/A 08/06/2019   Procedure: VISCERAL ANGIOGRAPHY;  Surgeon: [name], MD;  Location: [facility];  Service:  Cardiovascular;  Laterality: N/A;    Prior to Admission medications   Medication Sig Start Date End Date Taking? Authorizing Provider  aspirin EC 81 MG EC tablet Take 1 tablet (81 mg total) by mouth daily. 08/07/19   [name], MD  atorvastatin (LIPITOR) 40 MG tablet Take 1 tablet (40 mg total) by mouth daily. 08/07/19   [name], MD  blood glucose meter kit and supplies KIT Dispense based on patient and insurance preference. Use up to four times daily as directed. (FOR ICD-9 250.00, 250.01). 08/07/19   [name], MD  Blood Glucose Monitoring Suppl (GLUCOCOM BLOOD GLUCOSE MONITOR) DEVI USE TO CHECK BLOOD SUGARS DAILY 04/20/14   [provider]  clopidogrel (PLAVIX) 75 MG tablet Take 2 tablets (150 mg total) by mouth daily. 08/11/19   [name], NP  diphenhydrAMINE (BENADRYL) 25 MG tablet Take 1 tablet (25 mg total) by mouth every 6 (six) hours as needed. 11/02/14   Carrie Mew, MD  EPINEPHrine 0.3 mg/0.3 mL IJ SOAJ injection Inject 0.3 mLs (0.3 mg total) into the muscle once. Follow package instructions as needed for severe allergy or anaphylactic reaction. 11/02/14   Carrie Mew, MD  insulin glargine (LANTUS) 100 UNIT/ML Solostar Pen Inject 20 Units into the skin at bedtime. 08/07/19   [name], MD  Insulin Pen Needle (PEN NEEDLES) 31G X 5 MM MISC 20 Units by Does not apply route at bedtime. 08/07/19   [name], MD  metFORMIN (GLUCOPHAGE) 500 MG tablet Take 1 tablet (500 mg total) by mouth 2 (two) times daily  with a meal. 08/07/19   [name], MD  metoprolol succinate (TOPROL XL) 25 MG 24 hr tablet Take 1 tablet (25 mg total) by mouth daily. 08/20/19   [name], MD  RELION TRUE METRIX TEST STRIPS test strip  08/09/19   [provider]    Allergies Wasp venom, Wasp venom protein, Coffee flavor, and Onion  Family History  Problem Relation Age of Onset  . Diabetes Mellitus II Mother   . Diabetes Mellitus II Brother   .  Hyperlipidemia Brother     Social History Social History   Tobacco Use  . Smoking status: Current Every Day Smoker    Packs/day: 2.00    Years: 48.00    Pack years: 96.00    Types: Cigarettes  . Smokeless tobacco: Never Used  Vaping Use  . Vaping Use: Never used  Substance Use Topics  . Alcohol use: No    Comment: Never been a problem  . Drug use: No    Review of Systems Constitutional: No fever/chills Eyes: No visual changes. ENT: No sore throat. Cardiovascular: Denies chest pain. Respiratory: Denies shortness of breath. Gastrointestinal: Positive for abdominal pain, resolved. Positive for rectal bleeding, black bowel movements.   Genitourinary: Negative for dysuria. Musculoskeletal: Negative for back pain. Skin: Negative for rash. Neurological: Positive for dizziness. ____________________________________________   PHYSICAL EXAM:  VITAL SIGNS: ED Triage Vitals  Enc Vitals Group     BP 08/21/19 1610 (!) 162/99     Pulse Rate 08/21/19 1610 (!) 111     Resp 08/21/19 1610 (!) 21     Temp --      Temp src --      SpO2 08/21/19 1610 99 %     Weight 08/21/19 1621 175 lb (79.4 kg)     Height 08/21/19 1621 5' 4"  (1.626 m)     Head Circumference --      Peak Flow --      Pain Score 08/21/19 1621 0    Constitutional: Alert and oriented.  Eyes: Conjunctivae are normal.  ENT      Head: Normocephalic and atraumatic.      Nose: No congestion/rhinnorhea.      Mouth/Throat: Mucous membranes are moist.      Neck: No stridor. Hematological/Lymphatic/Immunilogical: No cervical lymphadenopathy. Cardiovascular: Tachycardic, regular rhythm.  No murmurs, rubs, or gallops.  Respiratory: Normal respiratory effort without tachypnea nor retractions. Breath sounds are clear and equal bilaterally. No wheezes/rales/rhonchi. Gastrointestinal: Soft and non tender. No rebound. No guarding.  Rectal: Black stool on glove. GUIAC positive.  Musculoskeletal: Normal range of motion in all  extremities. No lower extremity edema. Neurologic:  Normal speech and language. No gross focal neurologic deficits are appreciated.  Skin:  Skin is warm, dry and intact. No rash noted. Psychiatric: Mood and affect are normal. Speech and behavior are normal. Patient exhibits appropriate insight and judgment.  ____________________________________________    LABS (pertinent positives/negatives)  BMP wnl except glu 268, ca 8.7 CBC wbc 9.6, hgb 11.5, plt 650 Trop hs 4 ____________________________________________   EKG  I, Nance Pear, attending physician, personally viewed and interpreted this EKG  EKG Time: 1616 Rate: 109 Rhythm: sinus tachycardia Axis: normal Intervals: qtc 435 QRS: narrow ST changes: no st elevation Impression: abnormal ekg  ____________________________________________    RADIOLOGY  CT angio No PE  ____________________________________________   PROCEDURES  Procedures  ____________________________________________   INITIAL IMPRESSION / ASSESSMENT AND PLAN / ED COURSE  Pertinent labs & imaging results that were available  during my care of the patient were reviewed by me and considered in my medical decision making (see chart for details).   Patient presented to the emergency department today because of concerns for dizziness, weakness near syncopal episodes as well as GI bleed.  In terms of the dizziness patient has been worked up for this as an outpatient with negative MRI.  He states today it was worse with a near syncopal episode.  I did obtain a CT angio to investigate for possible PE.  This was negative.  Additionally blood work without concerning anemia.  Terms of the GI bleed he did have melanotic stool.  He was recently started on Plavix.  Do think patient would benefit from observation further management in the hospital.  I discussed findings and plan with patient. ____________________________________________   FINAL CLINICAL  IMPRESSION(S) / ED DIAGNOSES  Final diagnoses:  Near syncope  Gastrointestinal hemorrhage, unspecified gastrointestinal hemorrhage type     Note: This dictation was prepared with Dragon dictation. Any transcriptional errors that result from this process are unintentional     Nance Pear, MD 08/21/19 2014

## 2019-08-21 NOTE — ED Triage Notes (Signed)
Pt states 4-5 days of dizziness and bleeding in stools. Pt states did have left sided abd pain earlier, but is gone currently. Pt denies vomiting, fever.

## 2019-08-22 DIAGNOSIS — R55 Syncope and collapse: Secondary | ICD-10-CM

## 2019-08-22 DIAGNOSIS — Z79899 Other long term (current) drug therapy: Secondary | ICD-10-CM | POA: Diagnosis not present

## 2019-08-22 DIAGNOSIS — R42 Dizziness and giddiness: Secondary | ICD-10-CM

## 2019-08-22 DIAGNOSIS — K5521 Angiodysplasia of colon with hemorrhage: Secondary | ICD-10-CM | POA: Diagnosis present

## 2019-08-22 DIAGNOSIS — Z794 Long term (current) use of insulin: Secondary | ICD-10-CM | POA: Diagnosis not present

## 2019-08-22 DIAGNOSIS — D62 Acute posthemorrhagic anemia: Secondary | ICD-10-CM | POA: Diagnosis present

## 2019-08-22 DIAGNOSIS — Z83438 Family history of other disorder of lipoprotein metabolism and other lipidemia: Secondary | ICD-10-CM | POA: Diagnosis not present

## 2019-08-22 DIAGNOSIS — K5520 Angiodysplasia of colon without hemorrhage: Secondary | ICD-10-CM | POA: Diagnosis not present

## 2019-08-22 DIAGNOSIS — K635 Polyp of colon: Secondary | ICD-10-CM | POA: Diagnosis present

## 2019-08-22 DIAGNOSIS — E785 Hyperlipidemia, unspecified: Secondary | ICD-10-CM | POA: Diagnosis present

## 2019-08-22 DIAGNOSIS — K640 First degree hemorrhoids: Secondary | ICD-10-CM | POA: Diagnosis present

## 2019-08-22 DIAGNOSIS — I10 Essential (primary) hypertension: Secondary | ICD-10-CM | POA: Diagnosis present

## 2019-08-22 DIAGNOSIS — F17210 Nicotine dependence, cigarettes, uncomplicated: Secondary | ICD-10-CM | POA: Diagnosis present

## 2019-08-22 DIAGNOSIS — E876 Hypokalemia: Secondary | ICD-10-CM | POA: Diagnosis present

## 2019-08-22 DIAGNOSIS — E0865 Diabetes mellitus due to underlying condition with hyperglycemia: Secondary | ICD-10-CM | POA: Diagnosis not present

## 2019-08-22 DIAGNOSIS — E1165 Type 2 diabetes mellitus with hyperglycemia: Secondary | ICD-10-CM | POA: Diagnosis present

## 2019-08-22 DIAGNOSIS — Z833 Family history of diabetes mellitus: Secondary | ICD-10-CM | POA: Diagnosis not present

## 2019-08-22 DIAGNOSIS — K31811 Angiodysplasia of stomach and duodenum with bleeding: Secondary | ICD-10-CM | POA: Diagnosis present

## 2019-08-22 DIAGNOSIS — K921 Melena: Secondary | ICD-10-CM | POA: Diagnosis present

## 2019-08-22 DIAGNOSIS — I951 Orthostatic hypotension: Secondary | ICD-10-CM | POA: Diagnosis present

## 2019-08-22 DIAGNOSIS — K922 Gastrointestinal hemorrhage, unspecified: Secondary | ICD-10-CM | POA: Diagnosis present

## 2019-08-22 DIAGNOSIS — E119 Type 2 diabetes mellitus without complications: Secondary | ICD-10-CM | POA: Diagnosis not present

## 2019-08-22 DIAGNOSIS — Z7982 Long term (current) use of aspirin: Secondary | ICD-10-CM | POA: Diagnosis not present

## 2019-08-22 DIAGNOSIS — Z95820 Peripheral vascular angioplasty status with implants and grafts: Secondary | ICD-10-CM | POA: Diagnosis not present

## 2019-08-22 DIAGNOSIS — Z7902 Long term (current) use of antithrombotics/antiplatelets: Secondary | ICD-10-CM | POA: Diagnosis not present

## 2019-08-22 DIAGNOSIS — Z20822 Contact with and (suspected) exposure to covid-19: Secondary | ICD-10-CM | POA: Diagnosis present

## 2019-08-22 DIAGNOSIS — J449 Chronic obstructive pulmonary disease, unspecified: Secondary | ICD-10-CM | POA: Diagnosis present

## 2019-08-22 LAB — GLUCOSE, CAPILLARY
Glucose-Capillary: 98 mg/dL (ref 70–99)
Glucose-Capillary: 108 mg/dL — ABNORMAL HIGH (ref 70–99)
Glucose-Capillary: 101 mg/dL — ABNORMAL HIGH (ref 70–99)
Glucose-Capillary: 187 mg/dL — ABNORMAL HIGH (ref 70–99)
Glucose-Capillary: 126 mg/dL — ABNORMAL HIGH (ref 70–99)

## 2019-08-22 LAB — HEMOGLOBIN AND HEMATOCRIT, BLOOD
HCT: 31.4 % — ABNORMAL LOW (ref 39.0–52.0)
HCT: 31.1 % — ABNORMAL LOW (ref 39.0–52.0)
Hemoglobin: 10.1 g/dL — ABNORMAL LOW (ref 13.0–17.0)
Hemoglobin: 10 g/dL — ABNORMAL LOW (ref 13.0–17.0)

## 2019-08-22 MED ORDER — INSULIN GLARGINE 100 UNIT/ML ~~LOC~~ SOLN
20.0000 [IU] | Freq: Every day | SUBCUTANEOUS | Status: DC
  Administered 2019-08-22: 20 [IU] via SUBCUTANEOUS
  Filled 2019-08-22 (×2): qty 0.2

## 2019-08-22 MED ORDER — NICOTINE 21 MG/24HR TD PT24
21.0000 mg | MEDICATED_PATCH | Freq: Every day | TRANSDERMAL | Status: DC
  Administered 2019-08-23 – 2019-08-24 (×2): 21 mg via TRANSDERMAL
  Filled 2019-08-22 (×3): qty 1

## 2019-08-22 NOTE — ED Notes (Signed)
Report to royce, rn.  

## 2019-08-22 NOTE — Progress Notes (Signed)
PROGRESS NOTE    Benjamin Conley  ZHY:[id number] DOB: [birth year] DOA: 08/21/2019 PCP: [name], NP  Chief Complaint  Patient presents with  . Weakness    Brief Narrative:  54 year old male with hypertension and type 2 diabetes mellitus, recent splenic artery stent and on aspirin and Plavix presented with 1 week of generalized weakness with lightheadedness and near syncopal episode.  On the day of admission he had 3 episodes of black stool.  No nausea, vomiting, abdominal pain or hematemesis.  He saw his cardiologist 1 day back with the symptoms and had an MRI of the brain that was unremarkable. In the ED he was tachycardic in the 110s, blood pressure 160/99 but following admission was hypotensive with systolic blood pressure in the 80s.  Blood work showed hemoglobin of 11.5 (almost 3 g drop from 1 week ago).  Stool Hemoccult was positive.  CT angiogram of the chest negative for PE, showed a splenic artery stent with mottled appearance suggestive of prior infarct.  Patient started on Protonix drip and admitted to medical floor.   Assessment & Plan:   Principal Problem:   Melena with acute blood loss anemia About 3 g drop in hemoglobin.  Continue Protonix drip.  Aspirin and Plavix on hold.  Reports he has lost almost 8 pounds in the past 2 weeks.  GI consult appreciated and recommends at least 3-5 days off Plavix before endoscopic procedure and possible need for hemostasis. Serial H&H, type and screen.  Patient continues to smoke and counseled on cessation.  Denies use of NSAIDs.  Active Problems: Near syncope Secondary to orthostatic hypotension and symptomatic anemia.  Continue IV fluids.  Symptoms better today.  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  Surgery as needed.     Diabetes mellitus type II, uncontrolled with hyperglycemia (HCC) A1c of 11.3.  Resume bedtime Lantus.  Hold Metformin and monitor on sliding scale  coverage.    COPD with chronic bronchitis ([place]) Not on any meds.  Counseled on smoking cessation.  (Smokes 1/2 pack/day).  Nicotine patch.  Dyslipidemia Continue statin  Hypokalemia Replenished   DVT prophylaxis: SCDs Code Status: Full code Family Communication: None Disposition:   Status is: Inpatient  Remains inpatient appropriate because:Hemodynamically unstable.  Needs serial H&H monitoring and inpatient GI work-up.   Dispo: The patient is from: Home              Anticipated d/c is to: Home              Anticipated d/c date is: 3 days              Patient currently is not medically stable to d/c.       Consultants:   GI   Procedures: CT angiogram chest  Antimicrobials: (None  Subjective: Seen and examined.  Denies any dizziness, chest pain or shortness of breath.  Has not had melena since admission.  Objective: Vitals:   08/22/19 0700 08/22/19 0730 08/22/19 0800 08/22/19 0843  BP: 94/71 99/70 115/89 116/85  Pulse: 88 90 95 88  Resp: 15  18 17   Temp:    98.5 F (36.9 C)  TempSrc:    Oral  SpO2: 96% 98% 100% 100%  Weight:      Height:        Intake/Output Summary (Last 24 hours) at 08/22/2019 1142 Last data filed at 08/21/2019 2100 Gross per 24 hour  Intake 100 ml  Output --  Net 100 ml   Filed Weights   08/21/19 1621  Weight: 79.4 kg    Examination:  General: Middle-aged male not in distress HEENT: Pallor present, moist mucosa, supple neck Chest: Clear bilaterally CVs: Normal S1-S2 GI: Soft, nondistended, epigastric tenderness to pressure, bowel sounds present Musculoskeletal: Warm, no edema    Data Reviewed: I have personally reviewed following labs and imaging studies  CBC: Recent Labs  Lab 08/19/19 1257 08/21/19 0318 08/21/19 1620 08/21/19 2335 08/22/19 0530  WBC 12.0* 15.5* 9.6  --   --   NEUTROABS 6.8  --  5.3  --   --   HGB 11.9* 11.5* 11.5* 10.3* 10.1*  HCT 35.9* 35.1* 34.0* 31.2* 31.4*  MCV 90.7 91.4 88.3  --   --    PLT 659.0* 655* 650*  --   --     Basic Metabolic Panel: Recent Labs  Lab 08/19/19 1257 08/21/19 0318 08/21/19 1620  NA 137 139 136  K 3.9 3.2* 3.7  CL 100 101 101  CO2 30 29 25   GLUCOSE 192* 134* 268*  BUN 17 10 8   CREATININE 0.62 0.70 0.67  CALCIUM 9.5 9.0 8.7*    GFR: Estimated Creatinine Clearance: 101.7 mL/min (by C-G formula based on SCr of 0.67 mg/dL).  Liver Function Tests: Recent Labs  Lab 08/19/19 1257 08/21/19 0318  AST 14 15  ALT 20 21  ALKPHOS 98 78  BILITOT 0.2 0.6  PROT 7.4 7.6  ALBUMIN 4.1 3.8    CBG: Recent Labs  Lab 08/21/19 2104 08/21/19 2332 08/22/19 0328 08/22/19 0716  GLUCAP 131* 77 98 108*     Recent Results (from the past 240 hour(s))  SARS Coronavirus 2 by RT PCR (hospital order, performed in [facility] hospital lab) Nasopharyngeal Nasopharyngeal Swab     Status: None   Collection Time: 08/21/19  7:20 PM   Specimen: Nasopharyngeal Swab  Result Value Ref Range Status   SARS Coronavirus 2 NEGATIVE NEGATIVE Final    Comment: (NOTE) SARS-CoV-2 target nucleic acids are NOT DETECTED.  The SARS-CoV-2 RNA is generally detectable in upper and lower respiratory specimens during the acute phase of infection. The lowest concentration of SARS-CoV-2 viral copies this assay can detect is 250 copies / mL. A negative result does not preclude SARS-CoV-2 infection and should not be used as the sole basis for treatment or other patient management decisions.  A negative result may occur with improper specimen collection / handling, submission of specimen other than nasopharyngeal swab, presence of viral mutation(s) within the areas targeted by this assay, and inadequate number of viral copies (<250 copies / mL). A negative result must be combined with clinical observations, patient history, and epidemiological information.  Fact Sheet for Patients:   StrictlyIdeas.no  Fact Sheet for Healthcare  Providers: BankingDealers.co.za  This test is not yet approved or  cleared by the [place] FDA and has been authorized for detection and/or diagnosis of SARS-CoV-2 by FDA under an Emergency Use Authorization (EUA).  This EUA will remain in effect (meaning this test can be used) for the duration of the COVID-19 declaration under Section 564(b)(1) of the Act, 21 U.S.C. section 360bbb-3(b)(1), unless the authorization is terminated or revoked sooner.  Performed at [facility], [address] [place], [place] [address]          Radiology Studies: DG Chest 2 View  Result Date: 08/21/2019 CLINICAL DATA:  New onset tachycardia and leukocytosis EXAM: CHEST - 2 VIEW COMPARISON:  08/04/2019 CT FINDINGS: The heart size and mediastinal contours are within normal limits. Both lungs are clear. The visualized skeletal structures are unremarkable. IMPRESSION: No active cardiopulmonary disease. Electronically Signed   By: Inez Catalina M.D.   On: 08/21/2019 18:58   CT Angio Chest PE W and/or Wo Contrast  Result Date: 08/21/2019 CLINICAL DATA:  Tachycardia and weakness EXAM: CT ANGIOGRAPHY CHEST WITH CONTRAST TECHNIQUE: Multidetector CT imaging of the chest was performed using the standard protocol during bolus administration of intravenous contrast. Multiplanar CT image reconstructions and MIPs were obtained to evaluate the vascular anatomy. CONTRAST:  47mL OMNIPAQUE IOHEXOL 350 MG/ML SOLN COMPARISON:  Chest x-ray from the previous day. FINDINGS: Cardiovascular: Thoracic aorta and its branches are within normal limits. No cardiac enlargement is seen. Mild coronary calcifications are noted. The pulmonary artery shows a normal branching pattern. No definitive filling defect to suggest pulmonary embolism is seen. Mediastinum/Nodes: Thoracic inlet is within normal limits. No sizable hilar or mediastinal adenopathy is noted. A few small calcified lymph nodes are noted within  the left infrahilar region. The esophagus is air-filled and within normal limits. Lungs/Pleura: The lungs are well aerated bilaterally. No focal infiltrate or sizable effusion is seen. No pneumothorax is seen. No parenchymal nodules are noted. Upper Abdomen: Scattered calcifications are noted within the spleen consistent with prior granulomatous disease. Splenic artery stent is noted. Persistent mottled enhancement of the spleen is noted consistent with the prior infarcts. The remainder of the upper abdomen appears within normal limits. Musculoskeletal: No chest wall abnormality. No acute or significant osseous findings. Review of the MIP images confirms the above findings. IMPRESSION: No evidence of pulmonary emboli. Changes of prior granulomatous disease. Patent splenic artery stent. Persistent mottled enhancement of the spleen is noted consistent with prior infarcts Electronically Signed   By: Inez Catalina M.D.   On: 08/21/2019 18:58        Scheduled Meds: . insulin aspart  0-15 Units Subcutaneous Q4H  . nicotine  21 mg Transdermal Daily  . [START ON 08/25/2019] pantoprazole  40 mg Intravenous Q12H   Continuous Infusions: . sodium chloride 100 mL/hr at 08/21/19 2112  . pantoprozole (PROTONIX) infusion 8 mg/hr (08/21/19 2110)     LOS: 0 days    Time spent: 35 minutes    Raliegh Scobie, MD [facility]   To contact the attending provider between 7A-7P or the covering provider during after hours 7P-7A, please log into the web site [email or web address] and access using universal [place] password for that web site. If you do not have the password, please call the hospital operator.  08/22/2019, 11:42 AM

## 2019-08-22 NOTE — Consult Note (Signed)
Vonda Antigua, MD [address], [place], [place], [place], [address] [address], [place], [place], [place], [address] Phone: [phone]  Fax: [phone]  Consultation  Referring Provider:     Dr. Clementeen Graham Primary Care Physician:  [name], NP Reason for Consultation:    Melena  Date of Admission:  08/21/2019 Date of Consultation:  08/22/2019         HPI:   Benjamin Conley is a 54 y.o. male with history of splenic artery stenting, on aspirin and Plavix, presents with 2 days of melena.  Denies abdominal pain.  No hematemesis.  Last Plavix use was yesterday.  Has not had a bowel movement today.  Baseline hemoglobin, 13-14.  11.9 on presentation.  10.1 this morning.  Past Medical History:  Diagnosis Date  . COPD (chronic obstructive pulmonary disease) ([place])   . Diabetes mellitus without complication ([place])   . Hyperlipemia   . 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 Tobacco abuse     Past Surgical History:  Procedure Laterality Date  . BACK SURGERY     lumbar  . STENT PLACEMENT VASCULAR ([place] [facility])  07/2019   stenosis of distal splenic artery and stent placed  . VISCERAL ANGIOGRAPHY N/A 08/06/2019   Procedure: VISCERAL ANGIOGRAPHY;  Surgeon: [name], MD;  Location: [facility];  Service: Cardiovascular;  Laterality: N/A;    Prior to Admission medications   Medication Sig Start Date End Date Taking? Authorizing Provider  aspirin EC 81 MG EC tablet Take 1 tablet (81 mg total) by mouth daily. 08/07/19  Yes [name], MD  atorvastatin (LIPITOR) 40 MG tablet Take 1 tablet (40 mg total) by mouth daily. 08/07/19  Yes [name], MD  clopidogrel (PLAVIX) 75 MG tablet Take 2 tablets (150 mg total) by mouth daily. 08/11/19  Yes [name], NP  insulin glargine (LANTUS) 100 UNIT/ML Solostar Pen Inject 20 Units into the skin at bedtime. 08/07/19  Yes [name], MD  metFORMIN (GLUCOPHAGE) 500 MG tablet Take 1 tablet (500 mg total) by mouth 2  (two) times daily with a meal. 08/07/19  Yes [name], MD  metoprolol succinate (TOPROL XL) 25 MG 24 hr tablet Take 1 tablet (25 mg total) by mouth daily. 08/20/19  Yes [name], MD    Family History  Problem Relation Age of Onset  . Diabetes Mellitus II Mother   . Diabetes Mellitus II Brother   . Hyperlipidemia Brother      Social History   Tobacco Use  . Smoking status: Current Every Day Smoker    Packs/day: 2.00    Years: 48.00    Pack years: 96.00    Types: Cigarettes  . Smokeless tobacco: Never Used  Vaping Use  . Vaping Use: Never used  Substance Use Topics  . Alcohol use: No    Comment: Never been a problem  . Drug use: No    Allergies as of 08/21/2019 - Review Complete 08/21/2019  Allergen Reaction Noted  . Wasp venom Anaphylaxis 11/02/2014  . Wasp venom protein Anaphylaxis 11/25/2014  . Coffee flavor Nausea And Vomiting 08/01/2019  . Onion  05/25/2018    Review of Systems:    All systems reviewed and negative except where noted in HPI.   Physical Exam:  Vital signs in last 24 hours: Vitals:   08/22/19 0700 08/22/19 0730 08/22/19 0800 08/22/19 0843  BP: 94/71 99/70 115/89 116/85  Pulse: 88 90 95 88  Resp: 15  18  17  Temp:    98.5 F (36.9 C)  TempSrc:    Oral  SpO2: 96% 98% 100% 100%  Weight:      Height:       Last BM Date: 08/21/19 General:   Pleasant, cooperative in NAD Head:  Normocephalic and atraumatic. Eyes:   No icterus.   Conjunctiva pink. PERRLA. Ears:  Normal auditory acuity. Neck:  Supple; no masses or thyroidomegaly Lungs: Respirations even and unlabored. Lungs clear to auscultation bilaterally.   No wheezes, crackles, or rhonchi.  Abdomen:  Soft, nondistended, nontender. Normal bowel sounds. No appreciable masses or hepatomegaly.  No rebound or guarding.  Neurologic:  Alert and oriented x3;  grossly normal neurologically. Skin:  Intact without significant lesions or rashes. Cervical Nodes:  No significant cervical  adenopathy. Psych:  Alert and cooperative. Normal affect.  LAB RESULTS: Recent Labs    08/19/19 1257 08/19/19 1257 08/21/19 0318 08/21/19 0318 08/21/19 1620 08/21/19 2335 08/22/19 0530  WBC 12.0*  --  15.5*  --  9.6  --   --   HGB 11.9*   < > 11.5*   < > 11.5* 10.3* 10.1*  HCT 35.9*   < > 35.1*   < > 34.0* 31.2* 31.4*  PLT 659.0*  --  655*  --  650*  --   --    < > = values in this interval not displayed.   BMET Recent Labs    08/19/19 1257 08/21/19 0318 08/21/19 1620  NA 137 139 136  K 3.9 3.2* 3.7  CL 100 101 101  CO2 30 29 25   GLUCOSE 192* 134* 268*  BUN 17 10 8   CREATININE 0.62 0.70 0.67  CALCIUM 9.5 9.0 8.7*   LFT Recent Labs    08/21/19 0318  PROT 7.6  ALBUMIN 3.8  AST 15  ALT 21  ALKPHOS 78  BILITOT 0.6   PT/INR Recent Labs    08/21/19 1620  LABPROT 12.8  INR 1.0    STUDIES: DG Chest 2 View  Result Date: 08/21/2019 CLINICAL DATA:  New onset tachycardia and leukocytosis EXAM: CHEST - 2 VIEW COMPARISON:  08/04/2019 CT FINDINGS: The heart size and mediastinal contours are within normal limits. Both lungs are clear. The visualized skeletal structures are unremarkable. IMPRESSION: No active cardiopulmonary disease. Electronically Signed   By: Inez Catalina M.D.   On: 08/21/2019 18:58   CT Angio Chest PE W and/or Wo Contrast  Result Date: 08/21/2019 CLINICAL DATA:  Tachycardia and weakness EXAM: CT ANGIOGRAPHY CHEST WITH CONTRAST TECHNIQUE: Multidetector CT imaging of the chest was performed using the standard protocol during bolus administration of intravenous contrast. Multiplanar CT image reconstructions and MIPs were obtained to evaluate the vascular anatomy. CONTRAST:  19mL OMNIPAQUE IOHEXOL 350 MG/ML SOLN COMPARISON:  Chest x-ray from the previous day. FINDINGS: Cardiovascular: Thoracic aorta and its branches are within normal limits. No cardiac enlargement is seen. Mild coronary calcifications are noted. The pulmonary artery shows a normal branching  pattern. No definitive filling defect to suggest pulmonary embolism is seen. Mediastinum/Nodes: Thoracic inlet is within normal limits. No sizable hilar or mediastinal adenopathy is noted. A few small calcified lymph nodes are noted within the left infrahilar region. The esophagus is air-filled and within normal limits. Lungs/Pleura: The lungs are well aerated bilaterally. No focal infiltrate or sizable effusion is seen. No pneumothorax is seen. No parenchymal nodules are noted. Upper Abdomen: Scattered calcifications are noted within the spleen consistent with prior granulomatous disease. Splenic artery stent is  noted. Persistent mottled enhancement of the spleen is noted consistent with the prior infarcts. The remainder of the upper abdomen appears within normal limits. Musculoskeletal: No chest wall abnormality. No acute or significant osseous findings. Review of the MIP images confirms the above findings. IMPRESSION: No evidence of pulmonary emboli. Changes of prior granulomatous disease. Patent splenic artery stent. Persistent mottled enhancement of the spleen is noted consistent with prior infarcts Electronically Signed   By: Inez Catalina M.D.   On: 08/21/2019 18:58      Impression / Plan:   Benjamin Conley is a 54 y.o. y/o male with melena for 2 days, hemodynamically stable with lowest hemoglobin at 10.1 since presentation, with recent aspirin and Plavix use due to splenic artery stent  Patient will need 3 to 5 days off Plavix before proceeding with endoscopic procedures, as  therapeutic endoscopic procedures for hemostasis are considered high risk  Patient is hemodynamically stable at this time, with no further bowel movement since admission.  However, if patient has recurrence of bleeding prior to the above timeline to hold Plavix, would recommend RBC scan or CTA depending on the rate of bleeding at that time.  If localized with these studies, patient will need vascular evaluation.  Earlier  endoscopy can also be considered if needed and patient will need to be reevaluated at that time  PPI IV twice daily  Continue serial CBCs and transfuse PRN Avoid NSAIDs Maintain 2 large-bore IV lines Please page GI with any acute hemodynamic changes, or signs of active GI bleeding  Dr. Allen Norris to follow from tomorrow  Thank you for involving me in the care of this patient.      LOS: 0 days   Virgel Manifold, MD  08/22/2019, 10:43 AM

## 2019-08-22 NOTE — Plan of Care (Signed)
Pt admitted today from the ED.  VSS. Denies pain.   Per pt he had black stool x2 during the shift. Denies n/v.  Tolerates clear liquid diet.

## 2019-08-23 ENCOUNTER — Telehealth: Payer: Self-pay | Admitting: Nurse Practitioner

## 2019-08-23 DIAGNOSIS — E119 Type 2 diabetes mellitus without complications: Secondary | ICD-10-CM

## 2019-08-23 DIAGNOSIS — K921 Melena: Secondary | ICD-10-CM

## 2019-08-23 LAB — GLUCOSE, CAPILLARY
Glucose-Capillary: 107 mg/dL — ABNORMAL HIGH (ref 70–99)
Glucose-Capillary: 150 mg/dL — ABNORMAL HIGH (ref 70–99)
Glucose-Capillary: 154 mg/dL — ABNORMAL HIGH (ref 70–99)
Glucose-Capillary: 64 mg/dL — ABNORMAL LOW (ref 70–99)
Glucose-Capillary: 78 mg/dL (ref 70–99)
Glucose-Capillary: 88 mg/dL (ref 70–99)

## 2019-08-23 LAB — HEMOGLOBIN AND HEMATOCRIT, BLOOD
HCT: 30.5 % — ABNORMAL LOW (ref 39.0–52.0)
HCT: 31 % — ABNORMAL LOW (ref 39.0–52.0)
Hemoglobin: 10.4 g/dL — ABNORMAL LOW (ref 13.0–17.0)
Hemoglobin: 9.9 g/dL — ABNORMAL LOW (ref 13.0–17.0)

## 2019-08-23 MED ORDER — PEG 3350-KCL-NA BICARB-NACL 420 G PO SOLR
4000.0000 mL | Freq: Once | ORAL | Status: AC
  Administered 2019-08-23: 4000 mL via ORAL
  Filled 2019-08-23: qty 4000

## 2019-08-23 MED ORDER — SODIUM CHLORIDE 0.9 % IV SOLN: INTRAVENOUS | Status: AC

## 2019-08-23 MED ORDER — INSULIN GLARGINE 100 UNIT/ML ~~LOC~~ SOLN
10.0000 [IU] | Freq: Every day | SUBCUTANEOUS | Status: DC
  Administered 2019-08-23 – 2019-08-24 (×2): 10 [IU] via SUBCUTANEOUS
  Filled 2019-08-23 (×3): qty 0.1

## 2019-08-23 NOTE — Progress Notes (Signed)
Initial Nutrition Assessment  DOCUMENTATION CODES:   Not applicable  INTERVENTION:   RD will monitor for diet advancement and order supplements if needed  NUTRITION DIAGNOSIS:   Inadequate oral intake related to acute illness (GIB) as evidenced by other (comment) (pt on clear liquid diet).  GOAL:   Patient will meet greater than or equal to 90% of their needs  MONITOR:   Supplement acceptance, Labs, Weight trends, Skin, I & O's, Diet advancement, PO intake  REASON FOR ASSESSMENT:   Malnutrition Screening Tool    ASSESSMENT:   54 year old male with COPD, hypertension, type 2 diabetes mellitus, recent splenic artery stent and on aspirin and Plavix presented with 1 week of generalized weakness with lightheadedness and near syncopal episode.   Pt eating 100% of his clear liquid diet. Plan is for NPO tonight and EGD/colonoscopy tomorrow. RD will monitor for diet advancement and order supplements if needed.   Per chart, pt down 10lbs(5%) over the past year; RD unsure how recently weight loss occurred.   Medications reviewed and include: insulin, nicotine, protonix  Labs reviewed: Hgb 10.4(L), Hct 31.0(L) cbgs- 88, 107, 154, 64, 78 x 24 hrs AIC 11.3(H)- 5/23  NUTRITION - FOCUSED PHYSICAL EXAM:  unable to perform at this time   Diet Order:   Diet Order            Diet NPO time specified  Diet effective midnight           Diet clear liquid Room service appropriate? Yes; Fluid consistency: Thin  Diet effective now                EDUCATION NEEDS:   No education needs have been identified at this time  Skin:  Skin Assessment: Reviewed RN Assessment  Last BM:  6/14- TYPE 7  Height:   Ht Readings from Last 1 Encounters:  08/21/19 5\' 4"  (1.626 m)    Weight:   Wt Readings from Last 1 Encounters:  08/21/19 79.4 kg    Ideal Body Weight:  59 kg  BMI:  Body mass index is 30.04 kg/m.  Estimated Nutritional Needs:   Kcal:  1800-2100kcal/day  Protein:   90-100g/day  Fluid:  >1.8L/day  Koleen Distance MS, RD, LDN Please refer to [facility] for RD and/or RD on-call/weekend/after hours pager

## 2019-08-23 NOTE — Telephone Encounter (Signed)
Pt wanted to let PCP know that he is in the hospital and has been there since Saturday. He went to fainting and blood in stool. FYI

## 2019-08-23 NOTE — Progress Notes (Signed)
PROGRESS NOTE    Benjamin Conley  WCB:[id number] DOB: [birth year] DOA: 08/21/2019 PCP: [name], NP  Chief Complaint  Patient presents with  . Weakness    Brief Narrative:  54 year old male with hypertension and type 2 diabetes mellitus, recent splenic artery stent and on aspirin and Plavix presented with 1 week of generalized weakness with lightheadedness and near syncopal episode.  On the day of admission he had 3 episodes of black stool.  No nausea, vomiting, abdominal pain or hematemesis.  He saw his cardiologist 1 day back with the symptoms and had an MRI of the brain that was unremarkable. In the ED he was tachycardic in the 110s, blood pressure 160/99 but following admission was hypotensive with systolic blood pressure in the 80s.  Blood work showed hemoglobin of 11.5 (almost 3 g drop from 1 week ago).  Stool Hemoccult was positive.  CT angiogram of the chest negative for PE, showed a splenic artery stent with mottled appearance suggestive of prior infarct.  Patient started on Protonix drip and admitted to medical floor.   Assessment & Plan:   Principal Problem:   Melena with acute blood loss anemia About 3 g drop in hemoglobin.  Denies use of NSAIDs.  Aspirin and Plavix on hold.  Continue Protonix drip.  Reports he has lost almost 8 pounds in the past 2 weeks.  GI consult appreciated and recommends at least 3-5 days off Plavix before endoscopic procedure and possible need for hemostasis. Monitor serial H&H.  Type and screen.  Counseled on smoking cessation.  Patient continues to smoke and counseled on cessation.    Active Problems: Near syncope Secondary to orthostatic hypotension and symptomatic anemia.  Improved with IV fluids.  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  Notified vascular surgery (had follow-up appointment in the office with Marcelle Overlie PA this week).     Diabetes mellitus type II,  uncontrolled with hyperglycemia (HCC) A1c of 11.3.  Continue bedtime Lantus.  CBG stable hold Metformin and monitor on sliding scale coverage.    COPD with chronic bronchitis ([place]) Not on any meds.  Counseled on smoking cessation.  (Smokes 1 and half pack/day).  Nicotine patch.  Dyslipidemia Continue statin  Hypokalemia Replenished   DVT prophylaxis: SCDs Code Status: Full code Family Communication: None Disposition:   Status is: Inpatient  Remains inpatient appropriate because:Hemodynamically unstable.  Needs inpatient GI work-up for symptomatic anemia and GI bleed.   Dispo: The patient is from: Home              Anticipated d/c is to: Home              Anticipated d/c date is: 3 days              Patient currently is not medically stable to d/c.       Consultants:   GI   Procedures: CT angiogram chest  Antimicrobials: (None  Subjective: Seen and examined.  Denies any dizziness.  No further melena.  Objective: Vitals:   08/22/19 1633 08/22/19 2033 08/23/19 0003 08/23/19 0748  BP: 110/87 112/84 100/70 113/76  Pulse: 82 82 85 87  Resp: 16 16 16    Temp:  98.3 F (36.8 C) 97.8 F (36.6 C) (!) 97.5 F (36.4 C)  TempSrc:  Oral Oral Oral  SpO2: 100% 100% 99% 100%  Weight:      Height:        Intake/Output  Summary (Last 24 hours) at 08/23/2019 1232 Last data filed at 08/23/2019 1019 Gross per 24 hour  Intake 2994.23 ml  Output 1845 ml  Net 1149.23 ml   Filed Weights   08/21/19 1621  Weight: 79.4 kg   Physical exam Not in distress HEENT: Pallor +, moist mucosa Chest: Clear CVs: Normal S1-S2 GI: Soft, nondistended, no epigastric tenderness, bowel sounds present Musculoskeletal: Warm, no edema   Data Reviewed: I have personally reviewed following labs and imaging studies  CBC: Recent Labs  Lab 08/19/19 1257 08/19/19 1257 08/21/19 0318 08/21/19 0318 08/21/19 1620 08/21/19 1620 08/21/19 2335 08/22/19 0530 08/22/19 1552 08/22/19 2354  08/23/19 0835  WBC 12.0*  --  15.5*  --  9.6  --   --   --   --   --   --   NEUTROABS 6.8  --   --   --  5.3  --   --   --   --   --   --   HGB 11.9*   < > 11.5*   < > 11.5*   < > 10.3* 10.1* 10.0* 9.9* 10.4*  HCT 35.9*   < > 35.1*   < > 34.0*   < > 31.2* 31.4* 31.1* 30.5* 31.0*  MCV 90.7  --  91.4  --  88.3  --   --   --   --   --   --   PLT 659.0*  --  655*  --  650*  --   --   --   --   --   --    < > = values in this interval not displayed.    Basic Metabolic Panel: Recent Labs  Lab 08/19/19 1257 08/21/19 0318 08/21/19 1620  NA 137 139 136  K 3.9 3.2* 3.7  CL 100 101 101  CO2 30 29 25   GLUCOSE 192* 134* 268*  BUN 17 10 8   CREATININE 0.62 0.70 0.67  CALCIUM 9.5 9.0 8.7*    GFR: Estimated Creatinine Clearance: 101.7 mL/min (by C-G formula based on SCr of 0.67 mg/dL).  Liver Function Tests: Recent Labs  Lab 08/19/19 1257 08/21/19 0318  AST 14 15  ALT 20 21  ALKPHOS 98 78  BILITOT 0.2 0.6  PROT 7.4 7.6  ALBUMIN 4.1 3.8    CBG: Recent Labs  Lab 08/22/19 2003 08/23/19 0004 08/23/19 0424 08/23/19 0749 08/23/19 1134  GLUCAP 126* 150* 88 107* 154*     Recent Results (from the past 240 hour(s))  SARS Coronavirus 2 by RT PCR (hospital order, performed in [facility] hospital lab) Nasopharyngeal Nasopharyngeal Swab     Status: None   Collection Time: 08/21/19  7:20 PM   Specimen: Nasopharyngeal Swab  Result Value Ref Range Status   SARS Coronavirus 2 NEGATIVE NEGATIVE Final    Comment: (NOTE) SARS-CoV-2 target nucleic acids are NOT DETECTED.  The SARS-CoV-2 RNA is generally detectable in upper and lower respiratory specimens during the acute phase of infection. The lowest concentration of SARS-CoV-2 viral copies this assay can detect is 250 copies / mL. A negative result does not preclude SARS-CoV-2 infection and should not be used as the sole basis for treatment or other patient management decisions.  A negative result may occur with improper specimen  collection / handling, submission of specimen other than nasopharyngeal swab, presence of viral mutation(s) within the areas targeted by this assay, and inadequate number of viral copies (<250 copies / mL). A negative result must be  combined with clinical observations, patient history, and epidemiological information.  Fact Sheet for Patients:   StrictlyIdeas.no  Fact Sheet for Healthcare Providers: BankingDealers.co.za  This test is not yet approved or  cleared by the [place] FDA and has been authorized for detection and/or diagnosis of SARS-CoV-2 by FDA under an Emergency Use Authorization (EUA).  This EUA will remain in effect (meaning this test can be used) for the duration of the COVID-19 declaration under Section 564(b)(1) of the Act, 21 U.S.C. section 360bbb-3(b)(1), unless the authorization is terminated or revoked sooner.  Performed at [facility], [address] [place], [place] [address]          Radiology Studies: CT Angio Chest PE W and/or Wo Contrast  Result Date: 08/21/2019 CLINICAL DATA:  Tachycardia and weakness EXAM: CT ANGIOGRAPHY CHEST WITH CONTRAST TECHNIQUE: Multidetector CT imaging of the chest was performed using the standard protocol during bolus administration of intravenous contrast. Multiplanar CT image reconstructions and MIPs were obtained to evaluate the vascular anatomy. CONTRAST:  44mL OMNIPAQUE IOHEXOL 350 MG/ML SOLN COMPARISON:  Chest x-ray from the previous day. FINDINGS: Cardiovascular: Thoracic aorta and its branches are within normal limits. No cardiac enlargement is seen. Mild coronary calcifications are noted. The pulmonary artery shows a normal branching pattern. No definitive filling defect to suggest pulmonary embolism is seen. Mediastinum/Nodes: Thoracic inlet is within normal limits. No sizable hilar or mediastinal adenopathy is noted. A few small calcified lymph nodes are  noted within the left infrahilar region. The esophagus is air-filled and within normal limits. Lungs/Pleura: The lungs are well aerated bilaterally. No focal infiltrate or sizable effusion is seen. No pneumothorax is seen. No parenchymal nodules are noted. Upper Abdomen: Scattered calcifications are noted within the spleen consistent with prior granulomatous disease. Splenic artery stent is noted. Persistent mottled enhancement of the spleen is noted consistent with the prior infarcts. The remainder of the upper abdomen appears within normal limits. Musculoskeletal: No chest wall abnormality. No acute or significant osseous findings. Review of the MIP images confirms the above findings. IMPRESSION: No evidence of pulmonary emboli. Changes of prior granulomatous disease. Patent splenic artery stent. Persistent mottled enhancement of the spleen is noted consistent with prior infarcts Electronically Signed   By: Inez Catalina M.D.   On: 08/21/2019 18:58        Scheduled Meds: . insulin aspart  0-15 Units Subcutaneous Q4H  . insulin glargine  20 Units Subcutaneous QHS  . nicotine  21 mg Transdermal Daily  . [START ON 08/25/2019] pantoprazole  40 mg Intravenous Q12H  . polyethylene glycol-electrolytes  4,000 mL Oral Once   Continuous Infusions: . pantoprozole (PROTONIX) infusion 8 mg/hr (08/23/19 0428)     LOS: 1 day    Time spent: 25 minutes    Shamyra Farias, MD [facility]   To contact the attending provider between 7A-7P or the covering provider during after hours 7P-7A, please log into the web site [email or web address] and access using universal [place] password for that web site. If you do not have the password, please call the hospital operator.  08/23/2019, 12:32 PM

## 2019-08-23 NOTE — Progress Notes (Signed)
Ch visited with Pt as part of routine rounding. Pt was cordial, but did not want prayer or visit.

## 2019-08-23 NOTE — Progress Notes (Addendum)
  Shift Summary:  Patient had no acute events overnight.  VSS and urinary output adequate.  No BMs on this shift.  Protonix gtt and IVF continued.  Patient SR on tele. FSBSs range between 88 and 150. Will continue to monitor patient.

## 2019-08-23 NOTE — Progress Notes (Signed)
Benjamin Lame, MD [facility]   [address] [place], [place] [address] Phone: [phone] Fax : [phone]   Subjective: This patient came in with anemia and had been on Plavix for splenic artery stent placement.  The patient also takes aspirin.  The patient states that his last stools have been clearing up but he came in with black stools.  The patient was found to have tachycardia and was admitted on Plavix.  The patient's Plavix was stopped and GI was consulted for source of his anemia/melena.  The patient denies ever having a colonoscopy in the past.   Objective: Vital signs in last 24 hours: Vitals:   08/22/19 1633 08/22/19 2033 08/23/19 0003 08/23/19 0748  BP: 110/87 112/84 100/70 113/76  Pulse: 82 82 85 87  Resp: 16 16 16    Temp:  98.3 F (36.8 C) 97.8 F (36.6 C) (!) 97.5 F (36.4 C)  TempSrc:  Oral Oral Oral  SpO2: 100% 100% 99% 100%  Weight:      Height:       Weight change:   Intake/Output Summary (Last 24 hours) at 08/23/2019 1316 Last data filed at 08/23/2019 1019 Gross per 24 hour  Intake 2994.23 ml  Output 1845 ml  Net 1149.23 ml     Exam: Heart:: Regular rate and rhythm, S1S2 present or without murmur or extra heart sounds Lungs: normal and clear to auscultation and percussion Abdomen: soft, nontender, normal bowel sounds   Lab Results: @LABTEST2 @ Micro Results: Recent Results (from the past 240 hour(s))  SARS Coronavirus 2 by RT PCR (hospital order, performed in [place] hospital lab) Nasopharyngeal Nasopharyngeal Swab     Status: None   Collection Time: 08/21/19  7:20 PM   Specimen: Nasopharyngeal Swab  Result Value Ref Range Status   SARS Coronavirus 2 NEGATIVE NEGATIVE Final    Comment: (NOTE) SARS-CoV-2 target nucleic acids are NOT DETECTED.  The SARS-CoV-2 RNA is generally detectable in upper and lower respiratory specimens during the acute phase of infection. The lowest concentration of SARS-CoV-2 viral copies this assay can  detect is 250 copies / mL. A negative result does not preclude SARS-CoV-2 infection and should not be used as the sole basis for treatment or other patient management decisions.  A negative result may occur with improper specimen collection / handling, submission of specimen other than nasopharyngeal swab, presence of viral mutation(s) within the areas targeted by this assay, and inadequate number of viral copies (<250 copies / mL). A negative result must be combined with clinical observations, patient history, and epidemiological information.  Fact Sheet for Patients:   StrictlyIdeas.no  Fact Sheet for Healthcare Providers: BankingDealers.co.za  This test is not yet approved or  cleared by the [place] FDA and has been authorized for detection and/or diagnosis of SARS-CoV-2 by FDA under an Emergency Use Authorization (EUA).  This EUA will remain in effect (meaning this test can be used) for the duration of the COVID-19 declaration under Section 564(b)(1) of the Act, 21 U.S.C. section 360bbb-3(b)(1), unless the authorization is terminated or revoked sooner.  Performed at [facility], [place], [place] [address]    Studies/Results: CT Angio Chest PE W and/or Wo Contrast  Result Date: 08/21/2019 CLINICAL DATA:  Tachycardia and weakness EXAM: CT ANGIOGRAPHY CHEST WITH CONTRAST TECHNIQUE: Multidetector CT imaging of the chest was performed using the standard protocol during bolus administration of intravenous contrast. Multiplanar CT image reconstructions and MIPs were obtained to evaluate the vascular anatomy. CONTRAST:  70mL OMNIPAQUE IOHEXOL 350 MG/ML SOLN COMPARISON:  Chest x-ray from the previous day. FINDINGS: Cardiovascular: Thoracic aorta and its branches are within normal limits. No cardiac enlargement is seen. Mild coronary calcifications are noted. The pulmonary artery shows a normal branching pattern.  No definitive filling defect to suggest pulmonary embolism is seen. Mediastinum/Nodes: Thoracic inlet is within normal limits. No sizable hilar or mediastinal adenopathy is noted. A few small calcified lymph nodes are noted within the left infrahilar region. The esophagus is air-filled and within normal limits. Lungs/Pleura: The lungs are well aerated bilaterally. No focal infiltrate or sizable effusion is seen. No pneumothorax is seen. No parenchymal nodules are noted. Upper Abdomen: Scattered calcifications are noted within the spleen consistent with prior granulomatous disease. Splenic artery stent is noted. Persistent mottled enhancement of the spleen is noted consistent with the prior infarcts. The remainder of the upper abdomen appears within normal limits. Musculoskeletal: No chest wall abnormality. No acute or significant osseous findings. Review of the MIP images confirms the above findings. IMPRESSION: No evidence of pulmonary emboli. Changes of prior granulomatous disease. Patent splenic artery stent. Persistent mottled enhancement of the spleen is noted consistent with prior infarcts Electronically Signed   By: Inez Catalina M.D.   On: 08/21/2019 18:58   Medications: I have reviewed the patient's current medications. Scheduled Meds: . insulin aspart  0-15 Units Subcutaneous Q4H  . insulin glargine  20 Units Subcutaneous QHS  . nicotine  21 mg Transdermal Daily  . [START ON 08/25/2019] pantoprazole  40 mg Intravenous Q12H  . polyethylene glycol-electrolytes  4,000 mL Oral Once   Continuous Infusions: . pantoprozole (PROTONIX) infusion 8 mg/hr (08/23/19 0428)   PRN Meds:.acetaminophen **OR** acetaminophen, HYDROcodone-acetaminophen, morphine injection, ondansetron **OR** ondansetron (ZOFRAN) IV   Assessment: Principal Problem:   Melena Active Problems:   Diabetes mellitus without complication (HCC)   COPD with chronic bronchitis (HCC)   Hypertension   Postural dizziness with  presyncope   Presence of arterial stent - splenic artery   Upper GI bleed   Gastrointestinal hemorrhage with melena    Plan: This patient came in with a history of a GI bleed likely from an upper source but the patient has never had a colonoscopy.  Since the patient will need to go on anticoagulation and is at risk of any further stopping of his anticoagulation I have explained to him that a upper endoscopy and colonoscopy should be done at the same time.  The patient will be set up for an EGD and colonoscopy for tomorrow.  The patient will also be given a prep for the colonoscopy to be done tomorrow..   LOS: 1 day   River Mckercher 08/23/2019, 1:16 PM Pager [phone] 7am-5pm  Check AMION for 5pm -7am coverage and on weekends

## 2019-08-23 NOTE — TOC Initial Note (Signed)
Transition of Care [facility]) - Initial/Assessment Note    Patient Details  Name: Benjamin Conley MRN: [id number] Date of Birth: [birth year]  [facility] CM/SW Contact:    Elease Hashimoto, LCSW Phone Number: 08/23/2019, 9:32 AM  Clinical Narrative:  Met with pt who lives with his brother and was independent prior to admission. He is followed by a PCP and does drive along with his brother. They assist one another and pt feels he has no needs except to figure out the bleeding issue. Will continue to follow and work on any discharge needs.                 Expected Discharge Plan: Home/Self Care Barriers to Discharge: Continued Medical Work up   Patient Goals and CMS Choice Patient states their goals for this hospitalization and ongoing recovery are:: I hope they find out what is causing me to bleed and fix it      Expected Discharge Plan and Services Expected Discharge Plan: Home/Self Care In-house Referral: Clinical Social Work     Living arrangements for the past 2 months: Mobile Home                                      Prior Living Arrangements/Services Living arrangements for the past 2 months: Mobile Home Lives with:: Siblings Patient language and need for interpreter reviewed:: No Do you feel safe going back to the place where you live?: Yes      Need for Family Participation in Patient Care: No (Comment) Care giver support system in place?: No (comment)   Criminal Activity/Legal Involvement Pertinent to Current Situation/Hospitalization: No - Comment as needed  Activities of Daily Living Home Assistive Devices/Equipment: Cane (specify quad or straight) (Straight) ADL Screening (condition at time of admission) Patient's cognitive ability adequate to safely complete daily activities?: Yes Is the patient deaf or have difficulty hearing?: No Does the patient have difficulty seeing, even when wearing glasses/contacts?: No Does the patient have  difficulty concentrating, remembering, or making decisions?: No Patient able to express need for assistance with ADLs?: Yes Does the patient have difficulty dressing or bathing?: No Independently performs ADLs?: Yes (appropriate for developmental age) Does the patient have difficulty walking or climbing stairs?: Yes Weakness of Legs: Both Weakness of Arms/Hands: None  Permission Sought/Granted Permission sought to share information with : Family Supports Permission granted to share information with : Yes, Verbal Permission Granted  Share Information with NAME: Annalee Genta     Permission granted to share info w Relationship: brother     Emotional Assessment Appearance:: Appears stated age Attitude/Demeanor/Rapport: Gracious Affect (typically observed): Adaptable, Accepting Orientation: : Oriented to Self, Oriented to Place, Oriented to  Time, Oriented to Situation Alcohol / Substance Use: Tobacco Use Psych Involvement: No (comment)  Admission diagnosis:  Upper GI bleed [K92.2] Near syncope [R55] Gastrointestinal hemorrhage with melena [K92.1] Gastrointestinal hemorrhage, unspecified gastrointestinal hemorrhage type [K92.2] Patient Active Problem List   Diagnosis Date Noted   Gastrointestinal hemorrhage with melena 08/22/2019   Melena 08/21/2019   Postural dizziness with presyncope 08/21/2019   Presence of arterial stent - splenic artery 08/21/2019   Upper GI bleed 08/21/2019   Paroxysmal supraventricular tachycardia ([place]) 08/19/2019   Dizziness 08/19/2019   Sinus tachycardia 08/19/2019   Chest pain 08/19/2019   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   COPD with chronic bronchitis ([place]) 08/01/2019   Tobacco abuse 08/01/2019  Diabetes mellitus without complication ([place])    Hyperlipemia    Hypertension 11/10/2013   PCP:  [name], NP Pharmacy:   [facility] [place], [place] - [address] [place]  [address] Phone: [phone] Fax: [phone]  [place] [address] [place] - [place] [address] Phone: [phone] Fax: [phone]     Social Determinants of Health (SDOH) Interventions    Readmission Risk Interventions No flowsheet data found.

## 2019-08-23 NOTE — Plan of Care (Signed)
  Problem: Education: Goal: Ability to identify signs and symptoms of gastrointestinal bleeding will improve Outcome: Progressing   Problem: Bowel/Gastric: Goal: Will show no signs and symptoms of gastrointestinal bleeding Outcome: Progressing   Problem: Education: Goal: Knowledge of General Education information will improve Description: Including pain rating scale, medication(s)/side effects and non-pharmacologic comfort measures Outcome: Progressing   Problem: Clinical Measurements: Goal: Ability to maintain clinical measurements within normal limits will improve Outcome: Progressing   Problem: Activity: Goal: Risk for activity intolerance will decrease Outcome: Progressing   Problem: Pain Managment: Goal: General experience of comfort will improve Outcome: Progressing   Problem: Safety: Goal: Ability to remain free from injury will improve Outcome: Progressing

## 2019-08-24 ENCOUNTER — Encounter
Admission: EM | Disposition: A | Discharge status: Home / Self Care | Payer: Self-pay | Source: Home / Self Care | Attending: Internal Medicine

## 2019-08-24 ENCOUNTER — Other Ambulatory Visit: Payer: Self-pay

## 2019-08-24 ENCOUNTER — Inpatient Hospital Stay: Payer: Medicare HMO | Admitting: Certified Registered"

## 2019-08-24 ENCOUNTER — Encounter: Payer: Self-pay | Admitting: Internal Medicine

## 2019-08-24 DIAGNOSIS — D62 Acute posthemorrhagic anemia: Secondary | ICD-10-CM

## 2019-08-24 DIAGNOSIS — K922 Gastrointestinal hemorrhage, unspecified: Secondary | ICD-10-CM

## 2019-08-24 DIAGNOSIS — D75839 Thrombocytosis, unspecified: Secondary | ICD-10-CM | POA: Insufficient documentation

## 2019-08-24 DIAGNOSIS — K5520 Angiodysplasia of colon without hemorrhage: Secondary | ICD-10-CM

## 2019-08-24 HISTORY — PX: ESOPHAGOGASTRODUODENOSCOPY (EGD) WITH PROPOFOL: SHX5813

## 2019-08-24 HISTORY — PX: COLONOSCOPY WITH PROPOFOL: SHX5780

## 2019-08-24 LAB — GLUCOSE, CAPILLARY
Glucose-Capillary: 116 mg/dL — ABNORMAL HIGH (ref 70–99)
Glucose-Capillary: 119 mg/dL — ABNORMAL HIGH (ref 70–99)
Glucose-Capillary: 185 mg/dL — ABNORMAL HIGH (ref 70–99)
Glucose-Capillary: 205 mg/dL — ABNORMAL HIGH (ref 70–99)
Glucose-Capillary: 219 mg/dL — ABNORMAL HIGH (ref 70–99)
Glucose-Capillary: 46 mg/dL — ABNORMAL LOW (ref 70–99)
Glucose-Capillary: 77 mg/dL (ref 70–99)
Glucose-Capillary: 78 mg/dL (ref 70–99)
Glucose-Capillary: 86 mg/dL (ref 70–99)
Glucose-Capillary: 87 mg/dL (ref 70–99)
Glucose-Capillary: 99 mg/dL (ref 70–99)

## 2019-08-24 LAB — HEMOGLOBIN AND HEMATOCRIT, BLOOD
HCT: 30.7 % — ABNORMAL LOW (ref 39.0–52.0)
Hemoglobin: 10 g/dL — ABNORMAL LOW (ref 13.0–17.0)

## 2019-08-24 SURGERY — COLONOSCOPY WITH PROPOFOL
Anesthesia: General

## 2019-08-24 MED ORDER — PHENYLEPHRINE HCL (PRESSORS) 10 MG/ML IV SOLN
INTRAVENOUS | Status: DC | PRN
  Administered 2019-08-24 (×3): 100 ug via INTRAVENOUS

## 2019-08-24 MED ORDER — EPHEDRINE SULFATE 50 MG/ML IJ SOLN
INTRAMUSCULAR | Status: DC | PRN
Start: 2019-08-24 — End: 2019-08-24
  Administered 2019-08-24 (×2): 5 mg via INTRAVENOUS

## 2019-08-24 MED ORDER — ONDANSETRON HCL 4 MG/2ML IJ SOLN: 4.0000 mg | Freq: Once | INTRAMUSCULAR | Status: DC | PRN

## 2019-08-24 MED ORDER — PROPOFOL 500 MG/50ML IV EMUL
INTRAVENOUS | Status: DC | PRN
  Administered 2019-08-24: 145 ug/kg/min via INTRAVENOUS

## 2019-08-24 MED ORDER — DEXTROSE 50 % IV SOLN: 25.0000 g | INTRAVENOUS | Status: AC

## 2019-08-24 MED ORDER — PROPOFOL 10 MG/ML IV BOLUS
INTRAVENOUS | Status: DC | PRN
  Administered 2019-08-24: 50 mg via INTRAVENOUS
  Administered 2019-08-24: 10 mg via INTRAVENOUS

## 2019-08-24 MED ORDER — GLYCOPYRROLATE 0.2 MG/ML IJ SOLN
INTRAMUSCULAR | Status: DC | PRN
  Administered 2019-08-24: .2 mg via INTRAVENOUS

## 2019-08-24 MED ORDER — DEXTROSE 50 % IV SOLN
12.5000 g | Freq: Once | INTRAVENOUS | Status: AC
  Administered 2019-08-24: 12.5 g via INTRAVENOUS

## 2019-08-24 MED ORDER — FENTANYL CITRATE (PF) 100 MCG/2ML IJ SOLN: 25.0000 ug | INTRAMUSCULAR | Status: DC | PRN

## 2019-08-24 MED ORDER — DEXTROSE 50 % IV SOLN
INTRAVENOUS | Status: AC
  Filled 2019-08-24: qty 50

## 2019-08-24 MED ORDER — PANTOPRAZOLE SODIUM 40 MG IV SOLR
40.0000 mg | Freq: Two times a day (BID) | INTRAVENOUS | Status: DC
  Administered 2019-08-24 – 2019-08-25 (×3): 40 mg via INTRAVENOUS
  Filled 2019-08-24 (×3): qty 40

## 2019-08-24 MED ORDER — DEXTROSE 50 % IV SOLN
INTRAVENOUS | Status: AC
  Administered 2019-08-24: 25 g via INTRAVENOUS
  Filled 2019-08-24: qty 50

## 2019-08-24 MED ORDER — LIDOCAINE HCL (CARDIAC) PF 100 MG/5ML IV SOSY
PREFILLED_SYRINGE | INTRAVENOUS | Status: DC | PRN
  Administered 2019-08-24: 100 mg via INTRAVENOUS

## 2019-08-24 NOTE — Plan of Care (Signed)
°  Problem: Education: Goal: Ability to identify signs and symptoms of gastrointestinal bleeding will improve Outcome: Progressing   Problem: Bowel/Gastric: Goal: Will show no signs and symptoms of gastrointestinal bleeding Outcome: Progressing   Problem: Education: Goal: Knowledge of General Education information will improve Description: Including pain rating scale, medication(s)/side effects and non-pharmacologic comfort measures Outcome: Progressing   Problem: Clinical Measurements: Goal: Ability to maintain clinical measurements within normal limits will improve Outcome: Progressing   Problem: Activity: Goal: Risk for activity intolerance will decrease Outcome: Progressing   Problem: Pain Managment: Goal: General experience of comfort will improve Outcome: Progressing   Problem: Safety: Goal: Ability to remain free from injury will improve Outcome: Progressing

## 2019-08-24 NOTE — Plan of Care (Signed)
  Problem: Education: Goal: Ability to identify signs and symptoms of gastrointestinal bleeding will improve Outcome: Progressing   Problem: Bowel/Gastric: Goal: Will show no signs and symptoms of gastrointestinal bleeding Outcome: Progressing   Problem: Education: Goal: Knowledge of General Education information will improve Description: Including pain rating scale, medication(s)/side effects and non-pharmacologic comfort measures Outcome: Progressing   Problem: Clinical Measurements: Goal: Ability to maintain clinical measurements within normal limits will improve Outcome: Progressing   Problem: Activity: Goal: Risk for activity intolerance will decrease Outcome: Progressing   Problem: Pain Managment: Goal: General experience of comfort will improve Outcome: Progressing   Problem: Safety: Goal: Ability to remain free from injury will improve Outcome: Progressing

## 2019-08-24 NOTE — Transfer of Care (Signed)
Immediate Anesthesia Transfer of Care Note  Patient: Benjamin Conley  Procedure(s) Performed: COLONOSCOPY WITH PROPOFOL (N/A ) ESOPHAGOGASTRODUODENOSCOPY (EGD) WITH PROPOFOL (N/A )  Patient Location: Endoscopy Unit  Anesthesia Type:General  Level of Consciousness: awake, drowsy and patient cooperative  Airway & Oxygen Therapy: Patient Spontanous Breathing and Patient connected to face mask oxygen  Post-op Assessment: Report given to RN and Post -op Vital signs reviewed and stable  Post vital signs: Reviewed and stable  Last Vitals:  Vitals Value Taken Time  BP    Temp    Pulse    Resp    SpO2      Last Pain:  Vitals:   08/24/19 1035  TempSrc: Temporal  PainSc: 2          Complications: No complications documented.

## 2019-08-24 NOTE — Progress Notes (Signed)
[place]  [phone]) [phone] [phone]) [phone]  ID: [name] OB: 12/04/1965  MR#: [id number]  VQM#:[id number]  Patient Care Team: [name], NP as PCP - General (Nurse Practitioner) [name], MD as PCP - Cardiology (Cardiology) [name], [facility] as Pharmacist (Pharmacist) Lloyd Huger, MD as Consulting Physician (Oncology)  CHIEF COMPLAINT: Iron deficiency anemia, thrombocytosis.  INTERVAL HISTORY: Patient is a 54 year old male who was noted to have an increasing platelet count and decreasing hemoglobin on routine blood work.  He currently feels well and is asymptomatic.  He does not complain of any weakness or fatigue.  He has a good appetite and denies weight loss.  He has no chest pain, shortness of breath, cough, or hemoptysis.  He denies any nausea, vomiting, constipation, or diarrhea.  He denies any melena or hematochezia.  He has no urinary complaints.  Patient feels at his baseline offers no specific complaints today.  REVIEW OF SYSTEMS:   Review of Systems  Constitutional: Negative.  Negative for fever, malaise/fatigue and weight loss.  Respiratory: Negative.  Negative for cough, hemoptysis and shortness of breath.   Cardiovascular: Negative.  Negative for chest pain and leg swelling.  Gastrointestinal: Negative.  Negative for abdominal pain, blood in stool and melena.  Genitourinary: Negative.  Negative for hematuria.  Musculoskeletal: Negative.  Negative for back pain.  Skin: Negative.  Negative for rash.  Neurological: Negative.  Negative for dizziness, weakness and headaches.  Psychiatric/Behavioral: Negative.  The patient is not nervous/anxious.     As per HPI. Otherwise, a complete review of systems is negative.  PAST MEDICAL HISTORY: Past Medical History:  Diagnosis Date  . Complication of anesthesia    c/o difficulty breathing after anesthesia  . COPD (chronic obstructive pulmonary disease)  ([place])   . Diabetes mellitus without complication ([place])   . Hyperlipemia   . 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 Tobacco abuse     PAST SURGICAL HISTORY: Past Surgical History:  Procedure Laterality Date  . BACK SURGERY     lumbar  . COLONOSCOPY WITH PROPOFOL N/A 08/24/2019   Procedure: COLONOSCOPY WITH PROPOFOL;  Surgeon: [name], MD;  Location: [facility] ENDOSCOPY;  Service: Endoscopy;  Laterality: N/A;  . ESOPHAGOGASTRODUODENOSCOPY (EGD) WITH PROPOFOL N/A 08/24/2019   Procedure: ESOPHAGOGASTRODUODENOSCOPY (EGD) WITH PROPOFOL;  Surgeon: [name], MD;  Location: [facility] ENDOSCOPY;  Service: Endoscopy;  Laterality: N/A;  . STENT PLACEMENT VASCULAR ([place] [facility])  07/2019   stenosis of distal splenic artery and stent placed  . VISCERAL ANGIOGRAPHY N/A 08/06/2019   Procedure: VISCERAL ANGIOGRAPHY;  Surgeon: [name], MD;  Location: [place] CV LAB;  Service: Cardiovascular;  Laterality: N/A;    FAMILY HISTORY: Family History  Problem Relation Age of Onset  . Diabetes Mellitus II Mother   . Diabetes Mellitus II Brother   . Hyperlipidemia Brother   . Colon cancer Maternal Aunt   . Colon cancer Maternal Uncle     ADVANCED DIRECTIVES (Y/N):  N  HEALTH MAINTENANCE: Social History   Tobacco Use  . Smoking status: Current Every Day Smoker    Packs/day: 2.00    Years: 48.00    Pack years: 96.00    Types: Cigarettes  . Smokeless tobacco: Never Used  Vaping Use  . Vaping Use: Never used  Substance Use Topics  . Alcohol use: No    Comment: Never been a problem  . Drug use: No     Colonoscopy:  PAP:  Bone density:  Lipid panel:  Allergies  Allergen Reactions  . Wasp Venom Anaphylaxis  . Wasp Venom Protein Anaphylaxis  . Coffee Flavor Nausea And Vomiting    Patient states coffee gives him nausea and stomach cramps  . Onion     Current Outpatient Medications  Medication Sig Dispense Refill  . aspirin EC 81 MG EC tablet Take 1 tablet (81 mg total) by mouth daily.  90 tablet 1  . atorvastatin (LIPITOR) 40 MG tablet Take 1 tablet (40 mg total) by mouth daily. 90 tablet 1  . clopidogrel (PLAVIX) 75 MG tablet Take 2 tablets (150 mg total) by mouth daily. 90 tablet 1  . insulin glargine (LANTUS) 100 UNIT/ML Solostar Pen Inject 20 Units into the skin at bedtime. 15 mL 11  . metFORMIN (GLUCOPHAGE) 500 MG tablet Take 1 tablet (500 mg total) by mouth 2 (two) times daily with a meal. 60 tablet 3  . metoprolol succinate (TOPROL XL) 25 MG 24 hr tablet Take 1 tablet (25 mg total) by mouth daily. 90 tablet 3  . pantoprazole (PROTONIX) 40 MG tablet Take 1 tablet (40 mg total) by mouth daily. 30 tablet 0   No current facility-administered medications for this visit.    OBJECTIVE: Vitals:   08/27/19 1341  BP: 127/82  Pulse: 95  Temp: (!) 97 F (36.1 C)  SpO2: 100%     Body mass index is 30.07 kg/m.    ECOG FS:0 - Asymptomatic  General: Well-developed, well-nourished, no acute distress. Eyes: Pink conjunctiva, anicteric sclera. HEENT: Normocephalic, moist mucous membranes. Lungs: No audible wheezing or coughing. Heart: Regular rate and rhythm. Abdomen: Soft, nontender, no obvious distention. Musculoskeletal: No edema, cyanosis, or clubbing. Neuro: Alert, answering all questions appropriately. Cranial nerves grossly intact. Skin: No rashes or petechiae noted. Psych: Normal affect. Lymphatics: No cervical, calvicular, axillary or inguinal LAD.   LAB RESULTS:  Lab Results  Component Value Date   NA 136 08/21/2019   K 3.7 08/21/2019   CL 101 08/21/2019   CO2 25 08/21/2019   GLUCOSE 268 (H) 08/21/2019   BUN 8 08/21/2019   CREATININE 0.67 08/21/2019   CALCIUM 8.7 (L) 08/21/2019   PROT 7.6 08/21/2019   ALBUMIN 3.8 08/21/2019   AST 15 08/21/2019   ALT 21 08/21/2019   ALKPHOS 78 08/21/2019   BILITOT 0.6 08/21/2019   GFRNONAA >60 08/21/2019   GFRAA >60 08/21/2019    Lab Results  Component Value Date   WBC 8.4 08/27/2019   NEUTROABS 5.3  08/21/2019   HGB 10.4 (L) 08/27/2019   HCT 32.0 (L) 08/27/2019   MCV 90.7 08/27/2019   PLT 565 (H) 08/27/2019   Lab Results  Component Value Date   IRON 35 (L) 08/27/2019   TIBC 371 08/27/2019   IRONPCTSAT 9 (L) 08/27/2019   Lab Results  Component Value Date   FERRITIN 28 08/27/2019     STUDIES: DG Chest 2 View  Result Date: 08/21/2019 CLINICAL DATA:  New onset tachycardia and leukocytosis EXAM: CHEST - 2 VIEW COMPARISON:  08/04/2019 CT FINDINGS: The heart size and mediastinal contours are within normal limits. Both lungs are clear. The visualized skeletal structures are unremarkable. IMPRESSION: No active cardiopulmonary disease. Electronically Signed   By: Inez Catalina M.D.   On: 08/21/2019 18:58   CT Angio Chest PE W and/or Wo Contrast  Result Date: 08/21/2019 CLINICAL DATA:  Tachycardia and weakness EXAM: CT ANGIOGRAPHY CHEST WITH CONTRAST TECHNIQUE: Multidetector CT imaging of the chest was performed using the standard protocol during bolus administration of  intravenous contrast. Multiplanar CT image reconstructions and MIPs were obtained to evaluate the vascular anatomy. CONTRAST:  74mL OMNIPAQUE IOHEXOL 350 MG/ML SOLN COMPARISON:  Chest x-ray from the previous day. FINDINGS: Cardiovascular: Thoracic aorta and its branches are within normal limits. No cardiac enlargement is seen. Mild coronary calcifications are noted. The pulmonary artery shows a normal branching pattern. No definitive filling defect to suggest pulmonary embolism is seen. Mediastinum/Nodes: Thoracic inlet is within normal limits. No sizable hilar or mediastinal adenopathy is noted. A few small calcified lymph nodes are noted within the left infrahilar region. The esophagus is air-filled and within normal limits. Lungs/Pleura: The lungs are well aerated bilaterally. No focal infiltrate or sizable effusion is seen. No pneumothorax is seen. No parenchymal nodules are noted. Upper Abdomen: Scattered calcifications are  noted within the spleen consistent with prior granulomatous disease. Splenic artery stent is noted. Persistent mottled enhancement of the spleen is noted consistent with the prior infarcts. The remainder of the upper abdomen appears within normal limits. Musculoskeletal: No chest wall abnormality. No acute or significant osseous findings. Review of the MIP images confirms the above findings. IMPRESSION: No evidence of pulmonary emboli. Changes of prior granulomatous disease. Patent splenic artery stent. Persistent mottled enhancement of the spleen is noted consistent with prior infarcts Electronically Signed   By: Inez Catalina M.D.   On: 08/21/2019 18:58   CT ANGIO ABDOMEN W &/OR WO CONTRAST  Result Date: 08/04/2019 CLINICAL DATA:  Ischemia, abdominal pain, developing splenic infarction on prior CT EXAM: CT ANGIOGRAPHY CHEST AND ABDOMEN TECHNIQUE: Multidetector CT imaging of the chest and abdomen was performed using the standard protocol during bolus administration of intravenous contrast. Multiplanar CT image reconstructions and MIPs were obtained to evaluate the vascular anatomy. CONTRAST:  65mL OMNIPAQUE IOHEXOL 350 MG/ML SOLN COMPARISON:  CT abdomen pelvis, 08/01/2019 FINDINGS: CTA CHEST FINDINGS Cardiovascular: Preferential opacification of the thoracic aorta. Normal contour and caliber of the thoracic aorta. No evidence of aneurysm, dissection, or other acute aortic pathology. No significant atherosclerosis. Normal heart size. No pericardial effusion. Mediastinum/Nodes: No enlarged mediastinal, hilar, or axillary lymph nodes. Thyroid gland, trachea, and esophagus demonstrate no significant findings. Lungs/Pleura: Small, benign calcified pulmonary nodules of the left lower lobe. 3 mm noncalcified nodule of the lateral left lung base (series 5, image 107). No pleural effusion or pneumothorax. Musculoskeletal: No chest wall abnormality. No acute or significant osseous findings. Review of the MIP images  confirms the above findings. CTA ABDOMEN FINDINGS VASCULAR Normal contour and caliber of the abdominal aorta. No evidence of aneurysm, dissection, or other acute aortic pathology. Standard branching pattern of the abdominal aorta with solitary bilateral renal arteries. There is a small dissection and or pseudoaneurysm of the distal splenic artery, caliber no greater than 6 mm (series 7, image 86, series 4, image 102, series 6, image 53) of and mild atherosclerosis of the included common iliac arteries. No significant aortic atherosclerosis Review of the MIP images confirms the above findings. NON-VASCULAR Hepatobiliary: No solid liver abnormality is seen. Hepatic steatosis. No gallstones, gallbladder wall thickening, or biliary dilatation. Pancreas: Unremarkable. No pancreatic ductal dilatation or surrounding inflammatory changes. Spleen: Redemonstrated infarctions of the spleen. Adrenals/Urinary Tract: Adrenal glands are unremarkable. Kidneys are normal, without renal calculi, solid lesion, or hydronephrosis. Bladder is unremarkable. Stomach/Bowel: Stomach is within normal limits. Appendix appears normal. No evidence of bowel wall thickening, distention, or inflammatory changes. Lymphatic: No enlarged abdominal lymph nodes. Other: No abdominal wall hernia or abnormality. No abdominopelvic ascites. Musculoskeletal: No acute or  significant osseous findings. Review of the MIP images confirms the above findings. IMPRESSION: 1. There is a small dissection and/or pseudoaneurysm of the distal splenic artery, caliber no greater than 6 mm, likely source of splenic infarctions. 2. No other significant vascular abnormality in the chest or abdomen. 3. Redemonstrated infarctions of the spleen. 4. Hepatic steatosis. 5. There is a 3 mm noncalcified nodule of the lateral left lung base. No follow-up needed if patient is low-risk. Non-contrast chest CT can be considered in 12 months if patient is high-risk. This recommendation  follows the consensus statement: Guidelines for Management of Incidental Pulmonary Nodules Detected on CT Images: From the Fleischner Society 2017; Radiology 2017; 284:228-243. Electronically Signed   By: Eddie Candle M.D.   On: 08/04/2019 11:12   MR Brain Wo Contrast  Result Date: 08/20/2019 CLINICAL DATA:  Dizziness, nonspecific.  Dizziness for 4-5 days. EXAM: MRI HEAD WITHOUT CONTRAST TECHNIQUE: Multiplanar, multiecho pulse sequences of the brain and surrounding structures were obtained without intravenous contrast. COMPARISON:  None. FINDINGS: Brain: No acute infarct, hemorrhage, or mass lesion is present. Periventricular and scattered subcortical T2 hyperintensities bilaterally are moderately advanced for age. Mild atrophy is present. The ventricles are proportionate to the degree of atrophy. No significant extraaxial fluid collection is present. White matter changes continue into the brainstem. Cerebellum is unremarkable. The internal auditory canals are within normal limits. Vascular: Flow is present in the major intracranial arteries. Skull and upper cervical spine: The craniocervical junction is normal. Upper cervical spine is within normal limits. Marrow signal is unremarkable. Sinuses/Orbits: The paranasal sinuses and mastoid air cells are clear. The globes and orbits are within normal limits. IMPRESSION: 1. No acute intracranial abnormality. 2. Periventricular and scattered subcortical T2 hyperintensities bilaterally are moderately advanced for age. The finding is nonspecific but can be seen in the setting of chronic microvascular ischemia, a demyelinating process such as multiple sclerosis, vasculitis, complicated migraine headaches, or as the sequelae of a prior infectious or inflammatory process. Electronically Signed   By: San Morelle M.D.   On: 08/20/2019 09:50   CT Abdomen Pelvis W Contrast  Result Date: 08/01/2019 CLINICAL DATA:  Left upper quadrant abdominal pain x4 days EXAM: CT  ABDOMEN AND PELVIS WITH CONTRAST TECHNIQUE: Multidetector CT imaging of the abdomen and pelvis was performed using the standard protocol following bolus administration of intravenous contrast. CONTRAST:  175mL OMNIPAQUE IOHEXOL 300 MG/ML  SOLN COMPARISON:  None. FINDINGS: Lower chest: Lung bases are clear. Hepatobiliary: Liver is within normal limits. Gallbladder is unremarkable. No intrahepatic or extrahepatic ductal dilatation. Pancreas: Within normal limits. Spleen: Heterogeneous hypoperfusion of the spleen, which persists on delayed imaging (series 7/image 1), suggesting developing splenic infarcts. Splenic vein is patent. However, there is splenic artery occlusion. Adrenals/Urinary Tract: Within normal limits. Kidneys are within normal limits.  No hydronephrosis. Bladder is mildly thick-walled although underdistended. Stomach/Bowel: Stomach is notable for a tiny hiatal hernia. No evidence of bowel obstruction. Appendix is not discretely visualized. Vascular/Lymphatic: No evidence of abdominal aortic aneurysm. Mild atherosclerotic calcifications the bilateral common iliac arteries. Splenic artery occlusion, as described above. Reproductive: Prostate is unremarkable. Other: No abdominopelvic ascites. Musculoskeletal: Status post PLIF at L4-5. Mild degenerative changes of the visualized thoracolumbar spine. IMPRESSION: Developing splenic infarcts, likely secondary to splenic artery occlusion. Splenic vein is patent. These results were called by telephone at the time of interpretation on 08/01/2019 at 5:40 am to provider Dr Alfred Levins, who verbally acknowledged these results. Electronically Signed   By: Henderson Newcomer.D.  On: 08/01/2019 05:40   PERIPHERAL VASCULAR CATHETERIZATION  Result Date: 08/10/2019 See op note  CT ANGIO CHEST AORTA W/CM &/OR WO/CM  Result Date: 08/04/2019 CLINICAL DATA:  Ischemia, abdominal pain, developing splenic infarction on prior CT EXAM: CT ANGIOGRAPHY CHEST AND ABDOMEN  TECHNIQUE: Multidetector CT imaging of the chest and abdomen was performed using the standard protocol during bolus administration of intravenous contrast. Multiplanar CT image reconstructions and MIPs were obtained to evaluate the vascular anatomy. CONTRAST:  30mL OMNIPAQUE IOHEXOL 350 MG/ML SOLN COMPARISON:  CT abdomen pelvis, 08/01/2019 FINDINGS: CTA CHEST FINDINGS Cardiovascular: Preferential opacification of the thoracic aorta. Normal contour and caliber of the thoracic aorta. No evidence of aneurysm, dissection, or other acute aortic pathology. No significant atherosclerosis. Normal heart size. No pericardial effusion. Mediastinum/Nodes: No enlarged mediastinal, hilar, or axillary lymph nodes. Thyroid gland, trachea, and esophagus demonstrate no significant findings. Lungs/Pleura: Small, benign calcified pulmonary nodules of the left lower lobe. 3 mm noncalcified nodule of the lateral left lung base (series 5, image 107). No pleural effusion or pneumothorax. Musculoskeletal: No chest wall abnormality. No acute or significant osseous findings. Review of the MIP images confirms the above findings. CTA ABDOMEN FINDINGS VASCULAR Normal contour and caliber of the abdominal aorta. No evidence of aneurysm, dissection, or other acute aortic pathology. Standard branching pattern of the abdominal aorta with solitary bilateral renal arteries. There is a small dissection and or pseudoaneurysm of the distal splenic artery, caliber no greater than 6 mm (series 7, image 86, series 4, image 102, series 6, image 53) of and mild atherosclerosis of the included common iliac arteries. No significant aortic atherosclerosis Review of the MIP images confirms the above findings. NON-VASCULAR Hepatobiliary: No solid liver abnormality is seen. Hepatic steatosis. No gallstones, gallbladder wall thickening, or biliary dilatation. Pancreas: Unremarkable. No pancreatic ductal dilatation or surrounding inflammatory changes. Spleen:  Redemonstrated infarctions of the spleen. Adrenals/Urinary Tract: Adrenal glands are unremarkable. Kidneys are normal, without renal calculi, solid lesion, or hydronephrosis. Bladder is unremarkable. Stomach/Bowel: Stomach is within normal limits. Appendix appears normal. No evidence of bowel wall thickening, distention, or inflammatory changes. Lymphatic: No enlarged abdominal lymph nodes. Other: No abdominal wall hernia or abnormality. No abdominopelvic ascites. Musculoskeletal: No acute or significant osseous findings. Review of the MIP images confirms the above findings. IMPRESSION: 1. There is a small dissection and/or pseudoaneurysm of the distal splenic artery, caliber no greater than 6 mm, likely source of splenic infarctions. 2. No other significant vascular abnormality in the chest or abdomen. 3. Redemonstrated infarctions of the spleen. 4. Hepatic steatosis. 5. There is a 3 mm noncalcified nodule of the lateral left lung base. No follow-up needed if patient is low-risk. Non-contrast chest CT can be considered in 12 months if patient is high-risk. This recommendation follows the consensus statement: Guidelines for Management of Incidental Pulmonary Nodules Detected on CT Images: From the Fleischner Society 2017; Radiology 2017; 284:228-243. Electronically Signed   By: Eddie Candle M.D.   On: 08/04/2019 11:12    ASSESSMENT: Iron deficiency anemia, thrombocytosis. PLAN:   1. Iron deficiency anemia: Although patient's hemoglobin has improved, it remains decreased to 10.4 along with decreased iron stores.  Colonoscopy and EGD in June 2021 revealed a total of 4 angiodysplastic lesions that were cauterized with argon laser.  No other significant pathology was noted.  Patient will benefit from IV iron.  Return to clinic in 3 weeks for further evaluation, discussion of his laboratory results, and initiation of treatment. 2.  Thrombocytosis: Secondary to iron  deficiency.  IV iron as above.  JAK2 mutation was  sent for completeness and is pending at time of dictation. 3.  B12 deficiency: Patient will receive IM B12 injections at next clinic visit.  I spent a total of 45 minutes reviewing chart data, face-to-face evaluation with the patient, counseling and coordination of care as detailed above.   Patient expressed understanding and was in agreement with this plan. He also understands that He can call clinic at any time with any questions, concerns, or complaints.    Lloyd Huger, MD   08/29/2019 8:16 AM

## 2019-08-24 NOTE — Progress Notes (Signed)
PROGRESS NOTE    Benjamin Conley  FOY:[id number] DOB: 1965/03/17 DOA: 08/21/2019 PCP: [name], NP  Chief Complaint  Patient presents with  . Weakness    Brief Narrative:  54 year old male with hypertension and type 2 diabetes mellitus, recent splenic artery stent and on aspirin and Plavix presented with 1 week of generalized weakness with lightheadedness and near syncopal episode.  On the day of admission he had 3 episodes of black stool.  No nausea, vomiting, abdominal pain or hematemesis.  He saw his cardiologist 1 day back with the symptoms and had an MRI of the brain that was unremarkable. In the ED he was tachycardic in the 110s, blood pressure 160/99 but following admission was hypotensive with systolic blood pressure in the 80s.  Blood work showed hemoglobin of 11.5 (almost 3 g drop from 1 week ago).  Stool Hemoccult was positive.  CT angiogram of the chest negative for PE, showed a splenic artery stent with mottled appearance suggestive of prior infarct.  Patient started on Protonix drip and admitted to medical floor.   Assessment & Plan:   Principal Problem:   Melena with acute blood loss anemia About 3 g drop in hemoglobin.  Denies use of NSAIDs.  Aspirin and Plavix on hold.  Received Protonix reports he has lost almost 8 pounds in the past 2 weeks.  EGD done showing 2 nonbleeding angiodysplastic lesion in the duodenum which was treated with APC.  Colonoscopy showed nonbleeding angiodysplastic lesion treated with APC.  Nonbleeding internal hemorrhoids. Follow H&H in a.m.  Counseled on smoking cessation. Discussed with GI on resuming Plavix.  Active Problems: Near syncope Secondary to orthostatic hypotension and symptomatic anemia.  Improved with IV fluids.  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  Notified vascular surgery (had follow-up appointment in the office with Marcelle Overlie PA on 6/16.  Patient  informed to reschedule appointment.).     Diabetes mellitus type II, uncontrolled with hyperglycemia (HCC) A1c of 11.3.  Continue bedtime Lantus.  CBG stable, hold Metformin and monitor on sliding scale coverage.    COPD with chronic bronchitis ([place]) Not on any meds.  Counseled on smoking cessation.  (Smokes 1 and half pack/day).  Nicotine patch.  Dyslipidemia Continue statin  Hypokalemia Replenished   DVT prophylaxis: SCDs Code Status: Full code Family Communication: None Disposition:   Status is: Inpatient  Remains inpatient appropriate because:Hemodynamically unstable.  Needs overnight monitoring post EGD and APC for stable H&H.   Dispo: The patient is from: Home              Anticipated d/c is to: Home              Anticipated d/c date is: 1 day              Patient currently is not medically stable to d/c.       Consultants:   GI   Procedures: CT angiogram chest, EGD and colonoscopy  Antimicrobials: (None  Subjective: Seen and examined.  No overnight events.  Objective: Vitals:   08/24/19 1149 08/24/19 1159 08/24/19 1209 08/24/19 1255  BP: (!) 86/44 108/74 116/73 124/82  Pulse: 93 (!) 104 94 84  Resp: 20 (!) 22 13 18   Temp: (!) 96.9 F (36.1 C)   (!) 97.5 F (36.4 C)  TempSrc: Temporal   Oral  SpO2: 100% 100% 100% 100%  Weight:      Height:  Intake/Output Summary (Last 24 hours) at 08/24/2019 1354 Last data filed at 08/24/2019 1346 Gross per 24 hour  Intake 2333.24 ml  Output 3800 ml  Net -1466.76 ml   Filed Weights   08/21/19 1621  Weight: 79.4 kg   Physical exam Not in distress HEENT: Pallor present, moist mucosa Chest: Clear bilaterally CVs: Normal S1-S2 GI: Soft, nondistended, nontender Musculoskeletal: Warm, no edema    Data Reviewed: I have personally reviewed following labs and imaging studies  CBC: Recent Labs  Lab 08/19/19 1257 08/19/19 1257 08/21/19 0318 08/21/19 0318 08/21/19 1620 08/21/19 2335  08/22/19 0530 08/22/19 1552 08/22/19 2354 08/23/19 0835 08/24/19 0756  WBC 12.0*  --  15.5*  --  9.6  --   --   --   --   --   --   NEUTROABS 6.8  --   --   --  5.3  --   --   --   --   --   --   HGB 11.9*   < > 11.5*   < > 11.5*   < > 10.1* 10.0* 9.9* 10.4* 10.0*  HCT 35.9*   < > 35.1*   < > 34.0*   < > 31.4* 31.1* 30.5* 31.0* 30.7*  MCV 90.7  --  91.4  --  88.3  --   --   --   --   --   --   PLT 659.0*  --  655*  --  650*  --   --   --   --   --   --    < > = values in this interval not displayed.    Basic Metabolic Panel: Recent Labs  Lab 08/19/19 1257 08/21/19 0318 08/21/19 1620  NA 137 139 136  K 3.9 3.2* 3.7  CL 100 101 101  CO2 30 29 25   GLUCOSE 192* 134* 268*  BUN 17 10 8   CREATININE 0.62 0.70 0.67  CALCIUM 9.5 9.0 8.7*    GFR: Estimated Creatinine Clearance: 101.7 mL/min (by C-G formula based on SCr of 0.67 mg/dL).  Liver Function Tests: Recent Labs  Lab 08/19/19 1257 08/21/19 0318  AST 14 15  ALT 20 21  ALKPHOS 98 78  BILITOT 0.2 0.6  PROT 7.4 7.6  ALBUMIN 4.1 3.8    CBG: Recent Labs  Lab 08/24/19 0150 08/24/19 0354 08/24/19 0751 08/24/19 1040 08/24/19 1256  GLUCAP 185* 116* 86 77 99     Recent Results (from the past 240 hour(s))  SARS Coronavirus 2 by RT PCR (hospital order, performed in [facility] hospital lab) Nasopharyngeal Nasopharyngeal Swab     Status: None   Collection Time: 08/21/19  7:20 PM   Specimen: Nasopharyngeal Swab  Result Value Ref Range Status   SARS Coronavirus 2 NEGATIVE NEGATIVE Final    Comment: (NOTE) SARS-CoV-2 target nucleic acids are NOT DETECTED.  The SARS-CoV-2 RNA is generally detectable in upper and lower respiratory specimens during the acute phase of infection. The lowest concentration of SARS-CoV-2 viral copies this assay can detect is 250 copies / mL. A negative result does not preclude SARS-CoV-2 infection and should not be used as the sole basis for treatment or other patient management decisions.   A negative result may occur with improper specimen collection / handling, submission of specimen other than nasopharyngeal swab, presence of viral mutation(s) within the areas targeted by this assay, and inadequate number of viral copies (<250 copies / mL). A negative result must be combined with  clinical observations, patient history, and epidemiological information.  Fact Sheet for Patients:   StrictlyIdeas.no  Fact Sheet for Healthcare Providers: BankingDealers.co.za  This test is not yet approved or  cleared by the [place] FDA and has been authorized for detection and/or diagnosis of SARS-CoV-2 by FDA under an Emergency Use Authorization (EUA).  This EUA will remain in effect (meaning this test can be used) for the duration of the COVID-19 declaration under Section 564(b)(1) of the Act, 21 U.S.C. section 360bbb-3(b)(1), unless the authorization is terminated or revoked sooner.  Performed at [facility], [address], [place], [place] [address]          Radiology Studies: No results found.      Scheduled Meds: . dextrose      . insulin aspart  0-15 Units Subcutaneous Q4H  . insulin glargine  10 Units Subcutaneous QHS  . nicotine  21 mg Transdermal Daily  . pantoprazole  40 mg Intravenous Q12H   Continuous Infusions: . sodium chloride 75 mL/hr at 08/24/19 1305     LOS: 2 days    Time spent: 25 minutes    Mathius Birkeland, MD [facility]   To contact the attending provider between 7A-7P or the covering provider during after hours 7P-7A, please log into the web site [email or web address] and access using universal [place] password for that web site. If you do not have the password, please call the hospital operator.  08/24/2019, 1:54 PM

## 2019-08-24 NOTE — Progress Notes (Signed)
Patient FSBS rechecked after scheduled FSBS taken ~0000,  due to patient concern of feeling as if his was low.  Repeat FSBS 46 @ ~0100, Patient NPO due to pending EGD today. Hypoglycemic protocol was not implemented and c/o worsening hypoglycemia while awaiting order implementation and subsequent med verification, ad so 118 mL apple juice given to patient. Provider notified, instructed to implement hypoglycemic protocol order set. Once implemented patient admin 25 g of Dextrose 50. FSBS re-check 185, will continue to monitor. Patient NPO since MN with exception of 118 mL of apple juice at about 0100-0130. Will continue to monitor patient.

## 2019-08-24 NOTE — Progress Notes (Signed)
Shift Summary:  VSS and urinary output adequate. Patient drank all of bowel prep. BMs  clear and light brown with very little to no sediment.  Protonix gtt and IVF continued. Patient SR on tele. FSBSs range between 46 and 219. Patient NPO since midnight with exception  If 118 mLs of apple juice at about 0100-0130. Will continue to monitor patient. Possible colonoscopy/egd today.  Will continue to monitor.

## 2019-08-24 NOTE — Anesthesia Postprocedure Evaluation (Signed)
Anesthesia Post Note  Patient: Benjamin Conley  Procedure(s) Performed: COLONOSCOPY WITH PROPOFOL (N/A ) ESOPHAGOGASTRODUODENOSCOPY (EGD) WITH PROPOFOL (N/A )  Patient location during evaluation: Endoscopy Anesthesia Type: General Level of consciousness: awake and alert and oriented Pain management: pain level controlled Vital Signs Assessment: post-procedure vital signs reviewed and stable Respiratory status: spontaneous breathing Cardiovascular status: blood pressure returned to baseline Anesthetic complications: no   No complications documented.   Last Vitals:  Vitals:   08/24/19 1159 08/24/19 1209  BP: 108/74 116/73  Pulse: (!) 104 94  Resp: (!) 22 13  Temp:    SpO2: 100% 100%    Last Pain:  Vitals:   08/24/19 1209  TempSrc:   PainSc: 0-No pain                 Dasani Crear

## 2019-08-24 NOTE — Anesthesia Preprocedure Evaluation (Signed)
Anesthesia Evaluation  Patient identified by MRN, date of birth, ID band Patient awake    Reviewed: Allergy & Precautions, NPO status , Patient's Chart, lab work & pertinent test results  History of Anesthesia Complications (+) history of anesthetic complications  Airway Mallampati: III       Dental   Pulmonary COPD, Current Smoker,           Cardiovascular hypertension,      Neuro/Psych negative neurological ROS  negative psych ROS   GI/Hepatic negative GI ROS,   Endo/Other  diabetes  Renal/GU   negative genitourinary   Musculoskeletal negative musculoskeletal ROS (+)   Abdominal   Peds negative pediatric ROS (+)  Hematology negative hematology ROS (+)   Anesthesia Other Findings Past Medical History: No date: Complication of anesthesia     Comment:  c/o difficulty breathing after anesthesia No date: COPD (chronic obstructive pulmonary disease) (HCC) No date: Diabetes mellitus without complication (HCC) No date: Hyperlipemia 07/2019: Splenic infarction No date: Tobacco abuse  Reproductive/Obstetrics                             Anesthesia Physical Anesthesia Plan  ASA: III  Anesthesia Plan: General   Post-op Pain Management:    Induction: Intravenous  PONV Risk Score and Plan:   Airway Management Planned: Nasal Cannula  Additional Equipment:   Intra-op Plan:   Post-operative Plan:   Informed Consent: I have reviewed the patients History and Physical, chart, labs and discussed the procedure including the risks, benefits and alternatives for the proposed anesthesia with the patient or authorized representative who has indicated his/her understanding and acceptance.     Dental advisory given  Plan Discussed with: CRNA and Surgeon  Anesthesia Plan Comments:         Anesthesia Quick Evaluation

## 2019-08-24 NOTE — OR Nursing (Signed)
Transporter Roselyn Reef here to take pt back to his room via bed.

## 2019-08-24 NOTE — Progress Notes (Signed)
Inpatient Diabetes Program Recommendations  AACE/ADA: New Consensus Statement on Inpatient Glycemic Control (2015)  Target Ranges:  Prepandial:   less than 140 mg/dL      Peak postprandial:   less than 180 mg/dL (1-2 hours)      Critically ill patients:  140 - 180 mg/dL   Lab Results  Component Value Date   GLUCAP 86 08/24/2019   HGBA1C 11.3 (H) 08/01/2019    Review of Glycemic Control Results for Benjamin Conley, Benjamin Conley (MRN [id number]) as of 08/24/2019 09:22  Ref. Range 08/23/2019 11:34 08/23/2019 16:08 08/23/2019 16:29 08/23/2019 20:06 08/24/2019 00:01 08/24/2019 01:01 08/24/2019 01:50 08/24/2019 03:54 08/24/2019 07:51  Glucose-Capillary Latest Ref Range: 70 - 99 mg/dL 154 (H)  3 units given late at 1409 pm 64 (L) 78 219 (H)  Novolog 5 units given late at 2127 pm  Lantus 10 units 87 46 (L) 185 (H) 116 (H) 86   Diabetes history: DM 2 Outpatient Diabetes medications: Lantus 20 units, Metformin 500 mg bid Current orders for Inpatient glycemic control:  Lantus 10 units Novolog 0-15 units Q4 hours  A1c 11.3% in 5/23. DM Coordinator spoke with pt at that time and pt was placed back on insulin  Inpatient Diabetes Program Recommendations:    Pt having hypoglycemia after Novolog doses were given late. Consider decreasing Novolog to 0-9 units Q4 hours  Thanks,  Tama Headings RN, MSN, BC-ADM Inpatient Diabetes Coordinator Team Pager [phone] (8a-5p)

## 2019-08-24 NOTE — Op Note (Signed)
[facility] Gastroenterology Patient Name: Benjamin Conley Procedure Date: 08/24/2019 11:08 AM MRN: [id number] Account #: [id number] Date of Birth: 12-07-1965 Admit Type: Inpatient Age: 54 Room: [facility] ENDO ROOM 4 Gender: Male Note Status: Finalized Procedure:             Colonoscopy Indications:           Acute post hemorrhagic anemia Providers:             [name] MD, MD Referring MD:          Janalyn Harder. Jerelene Redden, NP (Referring MD) Medicines:             Propofol per Anesthesia Complications:         No immediate complications. Procedure:             Pre-Anesthesia Assessment:                        - Prior to the procedure, a History and Physical was                         performed, and patient medications and allergies were                         reviewed. The patient's tolerance of previous                         anesthesia was also reviewed. The risks and benefits                         of the procedure and the sedation options and risks                         were discussed with the patient. All questions were                         answered, and informed consent was obtained. Prior                         Anticoagulants: The patient has taken no previous                         anticoagulant or antiplatelet agents. ASA Grade                         Assessment: III - A patient with severe systemic                         disease. After reviewing the risks and benefits, the                         patient was deemed in satisfactory condition to                         undergo the procedure.                        After obtaining informed consent, the colonoscope was  passed under direct vision. Throughout the procedure,                         the patient's blood pressure, pulse, and oxygen                         saturations were monitored continuously. The                         Colonoscope was introduced through the anus and                          advanced to the the cecum, identified by appendiceal                         orifice and ileocecal valve. The colonoscopy was                         performed without difficulty. The patient tolerated                         the procedure well. The quality of the bowel                         preparation was excellent. Findings:      The perianal and digital rectal examinations were normal.      A 4 mm polyp was found in the sigmoid colon. The polyp was sessile. The       polyp was removed with a cold biopsy forceps. Resection and retrieval       were complete.      Non-bleeding internal hemorrhoids were found during retroflexion. The       hemorrhoids were Grade I (internal hemorrhoids that do not prolapse).      Two small angiodysplastic lesions without bleeding were found in the       cecum. Coagulation for tissue destruction using argon plasma at 2       liters/minute and 30 watts was successful.      A single small angiodysplastic lesion without bleeding was found in the       ascending colon. Coagulation for tissue destruction using argon plasma       at 2 liters/minute and 30 watts was successful. Impression:            - One 4 mm polyp in the sigmoid colon, removed with a                         cold biopsy forceps. Resected and retrieved.                        - Non-bleeding internal hemorrhoids.                        - Two non-bleeding colonic angiodysplastic lesions.                         Treated with argon plasma coagulation (APC).                        - A single non-bleeding colonic angiodysplastic  lesion. Treated with argon plasma coagulation (APC). Recommendation:        - Return patient to hospital ward for ongoing care.                        - Resume previous diet.                        - Continue present medications. Procedure Code(s):     --- Professional ---                        [phone], Colonoscopy, flexible;  with ablation of                         tumor(s), polyp(s), or other lesion(s) (includes pre-                         and post-dilation and guide wire passage, when                         performed)                        [id number], 26, Colonoscopy, flexible; with biopsy, single                         or multiple Diagnosis Code(s):     --- Professional ---                        D62, Acute posthemorrhagic anemia                        K55.20, Angiodysplasia of colon without hemorrhage                        K63.5, Polyp of colon CPT copyright 2019 American Medical Association. All rights reserved. The codes documented in this report are preliminary and upon coder review may  be revised to meet current compliance requirements. [name] MD, MD 08/24/2019 11:48:54 AM This report has been signed electronically. Number of Addenda: 0 Note Initiated On: 08/24/2019 11:08 AM Scope Withdrawal Time: 0 hours 10 minutes 4 seconds  Total Procedure Duration: 0 hours 14 minutes 3 seconds  Estimated Blood Loss:  Estimated blood loss: none.      [facility]

## 2019-08-24 NOTE — Op Note (Signed)
[facility] Gastroenterology Patient Name: Benjamin Conley Procedure Date: 08/24/2019 11:08 AM MRN: [id number] Account #: [id number] Date of Birth: 08/15/1965 Admit Type: Inpatient Age: 54 Room: [facility] ENDO ROOM 4 Gender: Male Note Status: Finalized Procedure:             Upper GI endoscopy Indications:           Acute post hemorrhagic anemia Providers:             [name] MD, MD Referring MD:          Janalyn Harder. Jerelene Redden, NP (Referring MD) Medicines:             Propofol per Anesthesia Complications:         No immediate complications. Procedure:             Pre-Anesthesia Assessment:                        - Prior to the procedure, a History and Physical was                         performed, and patient medications and allergies were                         reviewed. The patient's tolerance of previous                         anesthesia was also reviewed. The risks and benefits                         of the procedure and the sedation options and risks                         were discussed with the patient. All questions were                         answered, and informed consent was obtained. Prior                         Anticoagulants: The patient has taken Plavix                         (clopidogrel), last dose was 5 days prior to                         procedure. ASA Grade Assessment: III - A patient with                         severe systemic disease. After reviewing the risks and                         benefits, the patient was deemed in satisfactory                         condition to undergo the procedure.                        After obtaining informed consent, the endoscope was  passed under direct vision. Throughout the procedure,                         the patient's blood pressure, pulse, and oxygen                         saturations were monitored continuously. The Endoscope                         was introduced  through the mouth, and advanced to the                         third part of duodenum. The upper GI endoscopy was                         accomplished without difficulty. The patient tolerated                         the procedure well. Findings:      The examined esophagus was normal.      The entire examined stomach was normal.      Two angiodysplastic lesions without bleeding were found in the second       portion of the duodenum and in the third portion of the duodenum.       Coagulation for hemostasis using argon plasma at 2 liters/minute and 60       watts was successful. Impression:            - Normal esophagus.                        - Normal stomach.                        - Two non-bleeding angiodysplastic lesions in the                         duodenum. Treated with argon plasma coagulation (APC).                        - No specimens collected. Recommendation:        - Return patient to hospital ward for ongoing care.                        -                        - Continue present medications.                        - Perform a colonoscopy today. Procedure Code(s):     --- Professional ---                        [phone], Esophagogastroduodenoscopy, flexible,                         transoral; with control of bleeding, any method Diagnosis Code(s):     --- Professional ---                        D62, Acute posthemorrhagic anemia  K31.819, Angiodysplasia of stomach and duodenum                         without bleeding CPT copyright 2019 American Medical Association. All rights reserved. The codes documented in this report are preliminary and upon coder review may  be revised to meet current compliance requirements. [name] MD, MD 08/24/2019 11:29:42 AM This report has been signed electronically. Number of Addenda: 0 Note Initiated On: 08/24/2019 11:08 AM Estimated Blood Loss:  Estimated blood loss: none.      [facility]

## 2019-08-25 ENCOUNTER — Encounter: Payer: Self-pay | Admitting: Gastroenterology

## 2019-08-25 ENCOUNTER — Ambulatory Visit: Payer: Medicare HMO | Admitting: Nurse Practitioner

## 2019-08-25 LAB — SURGICAL PATHOLOGY

## 2019-08-25 LAB — GLUCOSE, CAPILLARY
Glucose-Capillary: 96 mg/dL (ref 70–99)
Glucose-Capillary: 130 mg/dL — ABNORMAL HIGH (ref 70–99)
Glucose-Capillary: 241 mg/dL — ABNORMAL HIGH (ref 70–99)

## 2019-08-25 LAB — CBC
HCT: 28.7 % — ABNORMAL LOW (ref 39.0–52.0)
Hemoglobin: 9.4 g/dL — ABNORMAL LOW (ref 13.0–17.0)
MCH: 29.7 pg (ref 26.0–34.0)
MCHC: 32.8 g/dL (ref 30.0–36.0)
MCV: 90.8 fL (ref 80.0–100.0)
Platelets: 554 10*3/uL — ABNORMAL HIGH (ref 150–400)
RBC: 3.16 MIL/uL — ABNORMAL LOW (ref 4.22–5.81)
RDW: 13.3 % (ref 11.5–15.5)
WBC: 10 10*3/uL (ref 4.0–10.5)
nRBC: 0 % (ref 0.0–0.2)

## 2019-08-25 MED ORDER — CLOPIDOGREL BISULFATE 75 MG PO TABS: 150.0000 mg | ORAL_TABLET | Freq: Every day | ORAL | 1 refills | Status: DC

## 2019-08-25 MED ORDER — PANTOPRAZOLE SODIUM 40 MG PO TBEC
40.0000 mg | DELAYED_RELEASE_TABLET | Freq: Every day | ORAL | 0 refills | Status: DC
Start: 2019-08-25 — End: 2019-09-27

## 2019-08-25 NOTE — Progress Notes (Signed)
Shift Summary:  Patient has had no acute events overnight. Patient tolerating diet well. FSBS raned between 78 -205. VSS and urinary output adequate and BMs. Patient SR on tele. Will continue to monitor.

## 2019-08-25 NOTE — TOC Transition Note (Signed)
Transition of Care [facility]) - CM/SW Discharge Note   Patient Details  Name: Benjamin Conley MRN: [id number] Date of Birth: 1965/12/15  [facility] CM/SW Contact:  Elease Hashimoto, LCSW Phone Number: 08/25/2019, 1:20 PM   Clinical Narrative:  Pt medically stable to return home with his brother. He has no discharge needs. Will follow up with GI and PCP. Brother to come and pick him up and transport home. No further follow due to DC today.     Final next level of care: Home/Self Care Barriers to Discharge: Barriers Resolved   Patient Goals and CMS Choice Patient states their goals for this hospitalization and ongoing recovery are:: I hope they find out what is causing me to bleed and fix it      Discharge Placement                Patient to be transferred to facility by: Freddie-brother Name of family member notified: brother Patient and family notified of of transfer: 08/25/19  Discharge Plan and Services In-house Referral: Clinical Social Work                                   Social Determinants of Health (SDOH) Interventions     Readmission Risk Interventions No flowsheet data found.

## 2019-08-25 NOTE — Discharge Summary (Signed)
Physician Discharge Summary  Juwon Scripter QHU:[id number] DOB: [birth year] DOA: 08/21/2019  PCP: [name], NP  Admit date: 08/21/2019 Discharge date: 08/25/2019  Admitted From: home Disposition:  home  Recommendations for Outpatient Follow-up:  1. Follow up with PCP in 1-2 weeks 2. F/u vascular surg in 1 week 3. F/u GI in 2 weeks   Home Health: no  Equipment/Devices:   Discharge Condition: stable  CODE STATUS: full  Diet recommendation: Heart Healthy / Carb Modified   Brief/Interim Summary: HPI was taken from Dr. Damita Dunnings: Charlotte Crumb Bralon Antkowiak is a 54 y.o. male with medical history significant for diabetes and hypertension, who is status post placement of splenic artery stent on 08/06/2019 by Dr. Franchot Gallo, no on aspirin and Plavix who complains of a 1 week history of weakness and lightheadedness feeling like he was going to pass out.  In the past 24 hours of arrival he had 3 episodes of black stool.  He denies associated abdominal pain or vomiting.  He denies chest pain, shortness of breath or palpitations.  Denies headache, blurred vision or one-sided weakness numbness or tingling.  Denies nausea, vomiting or dysuria.  He saw his cardiologist yesterday with a complaint of weakness and had an MRI of the brain that showed no acute intracranial abnormality but did show some possibly nonspecific findings.   ED Course: On arrival she was slightly tachycardic at 111 but with blood pressure of 162/99 and otherwise normal vitals.  Blood work notable for hemoglobin of 11.5 which is down from 15.6. on 08/13/19.  Stool guaiac was positive.  CT angio of the chest today showed no evidence of PE.  Showed splenic artery stent with mottled up appearance of the spleen consistent with prior infarcts.  Patient was started on Protonix bolus and infusion.  Hospitalist consulted for admission.  Hospital Course from Dr. Lenise Herald 08/25/19: Pt presented w/ melena and had a EGD and colonoscopy done. EGD showed  angiodysplastic lesions w/o bleeding in duodenum s/p hemostasis w/ argon plasma. Colonoscopy showed 78mm polyp in the  sigmoid colon which was removed for biopsy, internal non-bleeding hemorrhoids, & 2 non-bleeding colonic angiodysplastic lesions. Pt may restart taking aspirin but must hold plavix for another 3 days as per GI. Pt did not need therapy as per was ambulating and transferring independently. For more information, please see previous progress notes.   Discharge Diagnoses:  Principal Problem:   Melena Active Problems:   Diabetes mellitus without complication (HCC)   COPD with chronic bronchitis (HCC)   Hypertension   Postural dizziness with presyncope   Presence of arterial stent - splenic artery   Upper GI bleed   Gastrointestinal hemorrhage with melena   Acute posthemorrhagic anemia   Angiodysplasia of intestinal tract     Melena: with acute blood loss anemia.  Denies use of NSAIDs.  Aspirin and plavix on hold. EGD done showing 2 nonbleeding angiodysplastic lesion in the duodenum which was treated with APC.  Colonoscopy showed nonbleeding angiodysplastic lesion treated with APC.  Nonbleeding internal hemorrhoids.  Near syncope: secondary to orthostatic hypotension and symptomatic anemia. 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 Restart aspirin and hold Plavix x 3 days more as per GI. Notified vascular surgery (had follow-up appointment in the office with Marcelle Overlie PA on 6/16.  Patient informed to reschedule appointment.).  DM2: uncontrolled. HbA1c of 11.3.  Continue lantus & SSI w/ accuchecks. Continue to hold metformin   COPD with chronic bronchitis: nebs prn. W/o exacerbation  Smoker: smokes 1 and half pack/day.  Nicotine patch to prevent w/drawal. Smoking cessation counseling   Dyslipidemia: continue statin  Hypokalemia: WNL. Will continue to monitor    Discharge Instructions  Discharge Instructions    Diet - low sodium heart  healthy   Complete by: As directed    Diet Carb Modified   Complete by: As directed    Discharge instructions   Complete by: As directed    May restart taking aspirin but do NOT take plavix/clopidogrel for 3 days starting 08/25/19. F/u PCP in 1-2 weeks. F/u GI, Dr. Allen Norris, in 1-2 weeks   Increase activity slowly   Complete by: As directed      Allergies as of 08/25/2019      Reactions   Wasp Venom Anaphylaxis   Wasp Venom Protein Anaphylaxis   Coffee Flavor Nausea And Vomiting   Patient states coffee gives him nausea and stomach cramps   Onion       Medication List    TAKE these medications   aspirin 81 MG EC tablet Take 1 tablet (81 mg total) by mouth daily.   atorvastatin 40 MG tablet Commonly known as: LIPITOR Take 1 tablet (40 mg total) by mouth daily.   clopidogrel 75 MG tablet Commonly known as: PLAVIX Take 2 tablets (150 mg total) by mouth daily.   insulin glargine 100 UNIT/ML Solostar Pen Commonly known as: LANTUS Inject 20 Units into the skin at bedtime.   metFORMIN 500 MG tablet Commonly known as: GLUCOPHAGE Take 1 tablet (500 mg total) by mouth 2 (two) times daily with a meal.   metoprolol succinate 25 MG 24 hr tablet Commonly known as: Toprol XL Take 1 tablet (25 mg total) by mouth daily.   pantoprazole 40 MG tablet Commonly known as: Protonix Take 1 tablet (40 mg total) by mouth daily.       Follow-up Information    [name], NP Follow up.   Specialty: Nurse Practitioner Contact information: [address] [place] [address] [phone]        [name], MD .   Specialties: Cardiology, Radiology Contact information: [address] [place] [address] [phone]              Allergies  Allergen Reactions  . Wasp Venom Anaphylaxis  . Wasp Venom Protein Anaphylaxis  . Coffee Flavor Nausea And Vomiting    Patient states coffee gives him nausea and stomach cramps  . Onion      Consultations:  GI   Procedures/Studies: DG Chest 2 View  Result Date: 08/21/2019 CLINICAL DATA:  New onset tachycardia and leukocytosis EXAM: CHEST - 2 VIEW COMPARISON:  08/04/2019 CT FINDINGS: The heart size and mediastinal contours are within normal limits. Both lungs are clear. The visualized skeletal structures are unremarkable. IMPRESSION: No active cardiopulmonary disease. Electronically Signed   By: Inez Catalina M.D.   On: 08/21/2019 18:58   CT Angio Chest PE W and/or Wo Contrast  Result Date: 08/21/2019 CLINICAL DATA:  Tachycardia and weakness EXAM: CT ANGIOGRAPHY CHEST WITH CONTRAST TECHNIQUE: Multidetector CT imaging of the chest was performed using the standard protocol during bolus administration of intravenous contrast. Multiplanar CT image reconstructions and MIPs were obtained to evaluate the vascular anatomy. CONTRAST:  56mL OMNIPAQUE IOHEXOL 350 MG/ML SOLN COMPARISON:  Chest x-ray from the previous day. FINDINGS: Cardiovascular: Thoracic aorta and its branches are within normal limits. No cardiac enlargement is seen. Mild coronary calcifications are noted. The pulmonary artery shows  a normal branching pattern. No definitive filling defect to suggest pulmonary embolism is seen. Mediastinum/Nodes: Thoracic inlet is within normal limits. No sizable hilar or mediastinal adenopathy is noted. A few small calcified lymph nodes are noted within the left infrahilar region. The esophagus is air-filled and within normal limits. Lungs/Pleura: The lungs are well aerated bilaterally. No focal infiltrate or sizable effusion is seen. No pneumothorax is seen. No parenchymal nodules are noted. Upper Abdomen: Scattered calcifications are noted within the spleen consistent with prior granulomatous disease. Splenic artery stent is noted. Persistent mottled enhancement of the spleen is noted consistent with the prior infarcts. The remainder of the upper abdomen appears within normal limits.  Musculoskeletal: No chest wall abnormality. No acute or significant osseous findings. Review of the MIP images confirms the above findings. IMPRESSION: No evidence of pulmonary emboli. Changes of prior granulomatous disease. Patent splenic artery stent. Persistent mottled enhancement of the spleen is noted consistent with prior infarcts Electronically Signed   By: Inez Catalina M.D.   On: 08/21/2019 18:58   CT ANGIO ABDOMEN W &/OR WO CONTRAST  Result Date: 08/04/2019 CLINICAL DATA:  Ischemia, abdominal pain, developing splenic infarction on prior CT EXAM: CT ANGIOGRAPHY CHEST AND ABDOMEN TECHNIQUE: Multidetector CT imaging of the chest and abdomen was performed using the standard protocol during bolus administration of intravenous contrast. Multiplanar CT image reconstructions and MIPs were obtained to evaluate the vascular anatomy. CONTRAST:  38mL OMNIPAQUE IOHEXOL 350 MG/ML SOLN COMPARISON:  CT abdomen pelvis, 08/01/2019 FINDINGS: CTA CHEST FINDINGS Cardiovascular: Preferential opacification of the thoracic aorta. Normal contour and caliber of the thoracic aorta. No evidence of aneurysm, dissection, or other acute aortic pathology. No significant atherosclerosis. Normal heart size. No pericardial effusion. Mediastinum/Nodes: No enlarged mediastinal, hilar, or axillary lymph nodes. Thyroid gland, trachea, and esophagus demonstrate no significant findings. Lungs/Pleura: Small, benign calcified pulmonary nodules of the left lower lobe. 3 mm noncalcified nodule of the lateral left lung base (series 5, image 107). No pleural effusion or pneumothorax. Musculoskeletal: No chest wall abnormality. No acute or significant osseous findings. Review of the MIP images confirms the above findings. CTA ABDOMEN FINDINGS VASCULAR Normal contour and caliber of the abdominal aorta. No evidence of aneurysm, dissection, or other acute aortic pathology. Standard branching pattern of the abdominal aorta with solitary bilateral renal  arteries. There is a small dissection and or pseudoaneurysm of the distal splenic artery, caliber no greater than 6 mm (series 7, image 86, series 4, image 102, series 6, image 53) of and mild atherosclerosis of the included common iliac arteries. No significant aortic atherosclerosis Review of the MIP images confirms the above findings. NON-VASCULAR Hepatobiliary: No solid liver abnormality is seen. Hepatic steatosis. No gallstones, gallbladder wall thickening, or biliary dilatation. Pancreas: Unremarkable. No pancreatic ductal dilatation or surrounding inflammatory changes. Spleen: Redemonstrated infarctions of the spleen. Adrenals/Urinary Tract: Adrenal glands are unremarkable. Kidneys are normal, without renal calculi, solid lesion, or hydronephrosis. Bladder is unremarkable. Stomach/Bowel: Stomach is within normal limits. Appendix appears normal. No evidence of bowel wall thickening, distention, or inflammatory changes. Lymphatic: No enlarged abdominal lymph nodes. Other: No abdominal wall hernia or abnormality. No abdominopelvic ascites. Musculoskeletal: No acute or significant osseous findings. Review of the MIP images confirms the above findings. IMPRESSION: 1. There is a small dissection and/or pseudoaneurysm of the distal splenic artery, caliber no greater than 6 mm, likely source of splenic infarctions. 2. No other significant vascular abnormality in the chest or abdomen. 3. Redemonstrated infarctions of the spleen. 4. Hepatic  steatosis. 5. There is a 3 mm noncalcified nodule of the lateral left lung base. No follow-up needed if patient is low-risk. Non-contrast chest CT can be considered in 12 months if patient is high-risk. This recommendation follows the consensus statement: Guidelines for Management of Incidental Pulmonary Nodules Detected on CT Images: From the Fleischner Society 2017; Radiology 2017; 284:228-243. Electronically Signed   By: Eddie Candle M.D.   On: 08/04/2019 11:12   MR Brain Wo  Contrast  Result Date: 08/20/2019 CLINICAL DATA:  Dizziness, nonspecific.  Dizziness for 4-5 days. EXAM: MRI HEAD WITHOUT CONTRAST TECHNIQUE: Multiplanar, multiecho pulse sequences of the brain and surrounding structures were obtained without intravenous contrast. COMPARISON:  None. FINDINGS: Brain: No acute infarct, hemorrhage, or mass lesion is present. Periventricular and scattered subcortical T2 hyperintensities bilaterally are moderately advanced for age. Mild atrophy is present. The ventricles are proportionate to the degree of atrophy. No significant extraaxial fluid collection is present. White matter changes continue into the brainstem. Cerebellum is unremarkable. The internal auditory canals are within normal limits. Vascular: Flow is present in the major intracranial arteries. Skull and upper cervical spine: The craniocervical junction is normal. Upper cervical spine is within normal limits. Marrow signal is unremarkable. Sinuses/Orbits: The paranasal sinuses and mastoid air cells are clear. The globes and orbits are within normal limits. IMPRESSION: 1. No acute intracranial abnormality. 2. Periventricular and scattered subcortical T2 hyperintensities bilaterally are moderately advanced for age. The finding is nonspecific but can be seen in the setting of chronic microvascular ischemia, a demyelinating process such as multiple sclerosis, vasculitis, complicated migraine headaches, or as the sequelae of a prior infectious or inflammatory process. Electronically Signed   By: San Morelle M.D.   On: 08/20/2019 09:50   CT Abdomen Pelvis W Contrast  Result Date: 08/01/2019 CLINICAL DATA:  Left upper quadrant abdominal pain x4 days EXAM: CT ABDOMEN AND PELVIS WITH CONTRAST TECHNIQUE: Multidetector CT imaging of the abdomen and pelvis was performed using the standard protocol following bolus administration of intravenous contrast. CONTRAST:  123mL OMNIPAQUE IOHEXOL 300 MG/ML  SOLN COMPARISON:   None. FINDINGS: Lower chest: Lung bases are clear. Hepatobiliary: Liver is within normal limits. Gallbladder is unremarkable. No intrahepatic or extrahepatic ductal dilatation. Pancreas: Within normal limits. Spleen: Heterogeneous hypoperfusion of the spleen, which persists on delayed imaging (series 7/image 1), suggesting developing splenic infarcts. Splenic vein is patent. However, there is splenic artery occlusion. Adrenals/Urinary Tract: Within normal limits. Kidneys are within normal limits.  No hydronephrosis. Bladder is mildly thick-walled although underdistended. Stomach/Bowel: Stomach is notable for a tiny hiatal hernia. No evidence of bowel obstruction. Appendix is not discretely visualized. Vascular/Lymphatic: No evidence of abdominal aortic aneurysm. Mild atherosclerotic calcifications the bilateral common iliac arteries. Splenic artery occlusion, as described above. Reproductive: Prostate is unremarkable. Other: No abdominopelvic ascites. Musculoskeletal: Status post PLIF at L4-5. Mild degenerative changes of the visualized thoracolumbar spine. IMPRESSION: Developing splenic infarcts, likely secondary to splenic artery occlusion. Splenic vein is patent. These results were called by telephone at the time of interpretation on 08/01/2019 at 5:40 am to provider Dr Alfred Levins, who verbally acknowledged these results. Electronically Signed   By: Julian Hy M.D.   On: 08/01/2019 05:40   PERIPHERAL VASCULAR CATHETERIZATION  Result Date: 08/10/2019 See op note  CT ANGIO CHEST AORTA W/CM &/OR WO/CM  Result Date: 08/04/2019 CLINICAL DATA:  Ischemia, abdominal pain, developing splenic infarction on prior CT EXAM: CT ANGIOGRAPHY CHEST AND ABDOMEN TECHNIQUE: Multidetector CT imaging of the chest and abdomen was  performed using the standard protocol during bolus administration of intravenous contrast. Multiplanar CT image reconstructions and MIPs were obtained to evaluate the vascular anatomy. CONTRAST:   80mL OMNIPAQUE IOHEXOL 350 MG/ML SOLN COMPARISON:  CT abdomen pelvis, 08/01/2019 FINDINGS: CTA CHEST FINDINGS Cardiovascular: Preferential opacification of the thoracic aorta. Normal contour and caliber of the thoracic aorta. No evidence of aneurysm, dissection, or other acute aortic pathology. No significant atherosclerosis. Normal heart size. No pericardial effusion. Mediastinum/Nodes: No enlarged mediastinal, hilar, or axillary lymph nodes. Thyroid gland, trachea, and esophagus demonstrate no significant findings. Lungs/Pleura: Small, benign calcified pulmonary nodules of the left lower lobe. 3 mm noncalcified nodule of the lateral left lung base (series 5, image 107). No pleural effusion or pneumothorax. Musculoskeletal: No chest wall abnormality. No acute or significant osseous findings. Review of the MIP images confirms the above findings. CTA ABDOMEN FINDINGS VASCULAR Normal contour and caliber of the abdominal aorta. No evidence of aneurysm, dissection, or other acute aortic pathology. Standard branching pattern of the abdominal aorta with solitary bilateral renal arteries. There is a small dissection and or pseudoaneurysm of the distal splenic artery, caliber no greater than 6 mm (series 7, image 86, series 4, image 102, series 6, image 53) of and mild atherosclerosis of the included common iliac arteries. No significant aortic atherosclerosis Review of the MIP images confirms the above findings. NON-VASCULAR Hepatobiliary: No solid liver abnormality is seen. Hepatic steatosis. No gallstones, gallbladder wall thickening, or biliary dilatation. Pancreas: Unremarkable. No pancreatic ductal dilatation or surrounding inflammatory changes. Spleen: Redemonstrated infarctions of the spleen. Adrenals/Urinary Tract: Adrenal glands are unremarkable. Kidneys are normal, without renal calculi, solid lesion, or hydronephrosis. Bladder is unremarkable. Stomach/Bowel: Stomach is within normal limits. Appendix appears  normal. No evidence of bowel wall thickening, distention, or inflammatory changes. Lymphatic: No enlarged abdominal lymph nodes. Other: No abdominal wall hernia or abnormality. No abdominopelvic ascites. Musculoskeletal: No acute or significant osseous findings. Review of the MIP images confirms the above findings. IMPRESSION: 1. There is a small dissection and/or pseudoaneurysm of the distal splenic artery, caliber no greater than 6 mm, likely source of splenic infarctions. 2. No other significant vascular abnormality in the chest or abdomen. 3. Redemonstrated infarctions of the spleen. 4. Hepatic steatosis. 5. There is a 3 mm noncalcified nodule of the lateral left lung base. No follow-up needed if patient is low-risk. Non-contrast chest CT can be considered in 12 months if patient is high-risk. This recommendation follows the consensus statement: Guidelines for Management of Incidental Pulmonary Nodules Detected on CT Images: From the Fleischner Society 2017; Radiology 2017; 284:228-243. Electronically Signed   By: Eddie Candle M.D.   On: 08/04/2019 11:12    (Echo, Carotid, EGD, Colonoscopy, ERCP)    Subjective: Pt c/o fatigue   Discharge Exam: Vitals:   08/25/19 0744 08/25/19 0745  BP: 105/74 113/76  Pulse: 81 81  Resp:  16  Temp: 98.1 F (36.7 C)   SpO2: 98%    Vitals:   08/24/19 1558 08/24/19 2020 08/25/19 0744 08/25/19 0745  BP: 116/82 111/79 105/74 113/76  Pulse: 82 82 81 81  Resp: 16 18  16   Temp: 97.6 F (36.4 C) 98.9 F (37.2 C) 98.1 F (36.7 C)   TempSrc: Oral Oral Oral   SpO2: 100% 100% 98%   Weight:      Height:        General: Pt is alert, awake, not in acute distress Cardiovascular:  S1/S2 +, no rubs, no gallops Respiratory: CTA bilaterally, no wheezing,  no rhonchi Abdominal: Soft, NT, ND, bowel sounds + Extremities: no edema, no cyanosis    The results of significant diagnostics from this hospitalization (including imaging, microbiology, ancillary and  laboratory) are listed below for reference.     Microbiology: Recent Results (from the past 240 hour(s))  SARS Coronavirus 2 by RT PCR (hospital order, performed in [facility] hospital lab) Nasopharyngeal Nasopharyngeal Swab     Status: None   Collection Time: 08/21/19  7:20 PM   Specimen: Nasopharyngeal Swab  Result Value Ref Range Status   SARS Coronavirus 2 NEGATIVE NEGATIVE Final    Comment: (NOTE) SARS-CoV-2 target nucleic acids are NOT DETECTED.  The SARS-CoV-2 RNA is generally detectable in upper and lower respiratory specimens during the acute phase of infection. The lowest concentration of SARS-CoV-2 viral copies this assay can detect is 250 copies / mL. A negative result does not preclude SARS-CoV-2 infection and should not be used as the sole basis for treatment or other patient management decisions.  A negative result may occur with improper specimen collection / handling, submission of specimen other than nasopharyngeal swab, presence of viral mutation(s) within the areas targeted by this assay, and inadequate number of viral copies (<250 copies / mL). A negative result must be combined with clinical observations, patient history, and epidemiological information.  Fact Sheet for Patients:   StrictlyIdeas.no  Fact Sheet for Healthcare Providers: BankingDealers.co.za  This test is not yet approved or  cleared by the [place] FDA and has been authorized for detection and/or diagnosis of SARS-CoV-2 by FDA under an Emergency Use Authorization (EUA).  This EUA will remain in effect (meaning this test can be used) for the duration of the COVID-19 declaration under Section 564(b)(1) of the Act, 21 U.S.C. section 360bbb-3(b)(1), unless the authorization is terminated or revoked sooner.  Performed at [facility], [address] [place], [place] [address]      Labs: BNP (last 3 results) No results for  input(s): BNP in the last 8760 hours. Basic Metabolic Panel: Recent Labs  Lab 08/19/19 1257 08/21/19 0318 08/21/19 1620  NA 137 139 136  K 3.9 3.2* 3.7  CL 100 101 101  CO2 30 29 25   GLUCOSE 192* 134* 268*  BUN 17 10 8   CREATININE 0.62 0.70 0.67  CALCIUM 9.5 9.0 8.7*   Liver Function Tests: Recent Labs  Lab 08/19/19 1257 08/21/19 0318  AST 14 15  ALT 20 21  ALKPHOS 98 78  BILITOT 0.2 0.6  PROT 7.4 7.6  ALBUMIN 4.1 3.8   No results for input(s): LIPASE, AMYLASE in the last 168 hours. No results for input(s): AMMONIA in the last 168 hours. CBC: Recent Labs  Lab 08/19/19 1257 08/19/19 1257 08/21/19 0318 08/21/19 0318 08/21/19 1620 08/21/19 2335 08/22/19 1552 08/22/19 2354 08/23/19 0835 08/24/19 0756 08/25/19 0450  WBC 12.0*  --  15.5*  --  9.6  --   --   --   --   --  10.0  NEUTROABS 6.8  --   --   --  5.3  --   --   --   --   --   --   HGB 11.9*   < > 11.5*   < > 11.5*   < > 10.0* 9.9* 10.4* 10.0* 9.4*  HCT 35.9*   < > 35.1*   < > 34.0*   < > 31.1* 30.5* 31.0* 30.7* 28.7*  MCV 90.7  --  91.4  --  88.3  --   --   --   --   --  90.8  PLT 659.0*  --  655*  --  650*  --   --   --   --   --  554*   < > = values in this interval not displayed.   Cardiac Enzymes: No results for input(s): CKTOTAL, CKMB, CKMBINDEX, TROPONINI in the last 168 hours. BNP: Invalid input(s): POCBNP CBG: Recent Labs  Lab 08/24/19 2020 08/24/19 2329 08/25/19 0413 08/25/19 0747 08/25/19 1131  GLUCAP 205* 78 96 130* 241*   D-Dimer No results for input(s): DDIMER in the last 72 hours. Hgb A1c No results for input(s): HGBA1C in the last 72 hours. Lipid Profile No results for input(s): CHOL, HDL, LDLCALC, TRIG, CHOLHDL, LDLDIRECT in the last 72 hours. Thyroid function studies No results for input(s): TSH, T4TOTAL, T3FREE, THYROIDAB in the last 72 hours.  Invalid input(s): FREET3 Anemia work up No results for input(s): VITAMINB12, FOLATE, FERRITIN, TIBC, IRON, RETICCTPCT in the  last 72 hours. Urinalysis    Component Value Date/Time   COLORURINE YELLOW (A) 08/01/2019 0137   APPEARANCEUR CLEAR (A) 08/01/2019 0137   LABSPEC 1.024 08/01/2019 0137   PHURINE 6.0 08/01/2019 0137   GLUCOSEU >=500 (A) 08/01/2019 0137   HGBUR NEGATIVE 08/01/2019 0137   BILIRUBINUR NEGATIVE 08/01/2019 0137   KETONESUR 20 (A) 08/01/2019 0137   PROTEINUR NEGATIVE 08/01/2019 0137   NITRITE NEGATIVE 08/01/2019 0137   LEUKOCYTESUR NEGATIVE 08/01/2019 0137   Sepsis Labs Invalid input(s): PROCALCITONIN,  WBC,  LACTICIDVEN Microbiology Recent Results (from the past 240 hour(s))  SARS Coronavirus 2 by RT PCR (hospital order, performed in [place] hospital lab) Nasopharyngeal Nasopharyngeal Swab     Status: None   Collection Time: 08/21/19  7:20 PM   Specimen: Nasopharyngeal Swab  Result Value Ref Range Status   SARS Coronavirus 2 NEGATIVE NEGATIVE Final    Comment: (NOTE) SARS-CoV-2 target nucleic acids are NOT DETECTED.  The SARS-CoV-2 RNA is generally detectable in upper and lower respiratory specimens during the acute phase of infection. The lowest concentration of SARS-CoV-2 viral copies this assay can detect is 250 copies / mL. A negative result does not preclude SARS-CoV-2 infection and should not be used as the sole basis for treatment or other patient management decisions.  A negative result may occur with improper specimen collection / handling, submission of specimen other than nasopharyngeal swab, presence of viral mutation(s) within the areas targeted by this assay, and inadequate number of viral copies (<250 copies / mL). A negative result must be combined with clinical observations, patient history, and epidemiological information.  Fact Sheet for Patients:   StrictlyIdeas.no  Fact Sheet for Healthcare Providers: BankingDealers.co.za  This test is not yet approved or  cleared by the [place] FDA and has been  authorized for detection and/or diagnosis of SARS-CoV-2 by FDA under an Emergency Use Authorization (EUA).  This EUA will remain in effect (meaning this test can be used) for the duration of the COVID-19 declaration under Section 564(b)(1) of the Act, 21 U.S.C. section 360bbb-3(b)(1), unless the authorization is terminated or revoked sooner.  Performed at [facility], [address], [place], [place] [address]      Time coordinating discharge: Over 30 minutes  SIGNED:   [name], MD  [facility] 08/25/2019, 12:51 PM Pager   If 7PM-7AM, please contact night-coverage [email or web address]

## 2019-08-25 NOTE — Plan of Care (Signed)
  Problem: Education: Goal: Ability to identify signs and symptoms of gastrointestinal bleeding will improve Outcome: Progressing   Problem: Bowel/Gastric: Goal: Will show no signs and symptoms of gastrointestinal bleeding Outcome: Progressing   Problem: Education: Goal: Knowledge of General Education information will improve Description: Including pain rating scale, medication(s)/side effects and non-pharmacologic comfort measures Outcome: Progressing   Problem: Clinical Measurements: Goal: Ability to maintain clinical measurements within normal limits will improve Outcome: Progressing   Problem: Activity: Goal: Risk for activity intolerance will decrease Outcome: Progressing   Problem: Pain Managment: Goal: General experience of comfort will improve Outcome: Progressing   Problem: Safety: Goal: Ability to remain free from injury will improve Outcome: Progressing

## 2019-08-25 NOTE — Care Management Important Message (Signed)
Important Message  Patient Details  Name: Benjamin Conley MRN: [id number] Date of Birth: [birth year]   Medicare Important Message Given:  Yes     Juliann Pulse A Niel Peretti 08/25/2019, 10:40 AM

## 2019-08-25 NOTE — Discharge Instructions (Signed)
Carbohydrate Counting for Diabetes Mellitus, Adult  Carbohydrate counting is a method of keeping track of how many carbohydrates you eat. Eating carbohydrates naturally increases the amount of sugar (glucose) in the blood. Counting how many carbohydrates you eat helps keep your blood glucose within normal limits, which helps you manage your diabetes (diabetes mellitus). It is important to know how many carbohydrates you can safely have in each meal. This is different for every person. A diet and nutrition specialist (registered dietitian) can help you make a meal plan and calculate how many carbohydrates you should have at each meal and snack. Carbohydrates are found in the following foods:  Grains, such as breads and cereals.  Dried beans and soy products.  Starchy vegetables, such as potatoes, peas, and corn.  Fruit and fruit juices.  Milk and yogurt.  Sweets and snack foods, such as cake, cookies, candy, chips, and soft drinks. How do I count carbohydrates? There are two ways to count carbohydrates in food. You can use either of the methods or a combination of both. Reading "Nutrition Facts" on packaged food The "Nutrition Facts" list is included on the labels of almost all packaged foods and beverages in the [place] It includes:  The serving size.  Information about nutrients in each serving, including the grams (g) of carbohydrate per serving. To use the "Nutrition Facts":  Decide how many servings you will have.  Multiply the number of servings by the number of carbohydrates per serving.  The resulting number is the total amount of carbohydrates that you will be having. Learning standard serving sizes of other foods When you eat carbohydrate foods that are not packaged or do not include "Nutrition Facts" on the label, you need to measure the servings in order to count the amount of carbohydrates:  Measure the foods that you will eat with a food scale or measuring cup, if  needed.  Decide how many standard-size servings you will eat.  Multiply the number of servings by 15. Most carbohydrate-rich foods have about 15 g of carbohydrates per serving. ? For example, if you eat 8 oz (170 g) of strawberries, you will have eaten 2 servings and 30 g of carbohydrates (2 servings x 15 g = 30 g).  For foods that have more than one food mixed, such as soups and casseroles, you must count the carbohydrates in each food that is included. The following list contains standard serving sizes of common carbohydrate-rich foods. Each of these servings has about 15 g of carbohydrates:   hamburger bun or  English muffin.   oz (15 mL) syrup.   oz (14 g) jelly.  1 slice of bread.  1 six-inch tortilla.  3 oz (85 g) cooked rice or pasta.  4 oz (113 g) cooked dried beans.  4 oz (113 g) starchy vegetable, such as peas, corn, or potatoes.  4 oz (113 g) hot cereal.  4 oz (113 g) mashed potatoes or  of a large baked potato.  4 oz (113 g) canned or frozen fruit.  4 oz (120 mL) fruit juice.  4-6 crackers.  6 chicken nuggets.  6 oz (170 g) unsweetened dry cereal.  6 oz (170 g) plain fat-free yogurt or yogurt sweetened with artificial sweeteners.  8 oz (240 mL) milk.  8 oz (170 g) fresh fruit or one small piece of fruit.  24 oz (680 g) popped popcorn. Example of carbohydrate counting Sample meal  3 oz (85 g) chicken breast.  6 oz (170 g)   brown rice.  4 oz (113 g) corn.  8 oz (240 mL) milk.  8 oz (170 g) strawberries with sugar-free whipped topping. Carbohydrate calculation 1. Identify the foods that contain carbohydrates: ? Rice. ? Corn. ? Milk. ? Strawberries. 2. Calculate how many servings you have of each food: ? 2 servings rice. ? 1 serving corn. ? 1 serving milk. ? 1 serving strawberries. 3. Multiply each number of servings by 15 g: ? 2 servings rice x 15 g = 30 g. ? 1 serving corn x 15 g = 15 g. ? 1 serving milk x 15 g = 15 g. ? 1  serving strawberries x 15 g = 15 g. 4. Add together all of the amounts to find the total grams of carbohydrates eaten: ? 30 g + 15 g + 15 g + 15 g = 75 g of carbohydrates total. Summary  Carbohydrate counting is a method of keeping track of how many carbohydrates you eat.  Eating carbohydrates naturally increases the amount of sugar (glucose) in the blood.  Counting how many carbohydrates you eat helps keep your blood glucose within normal limits, which helps you manage your diabetes.  A diet and nutrition specialist (registered dietitian) can help you make a meal plan and calculate how many carbohydrates you should have at each meal and snack. This information is not intended to replace advice given to you by your health care provider. Make sure you discuss any questions you have with your health care provider. Document Revised: 09/19/2016 Document Reviewed: 08/09/2015 Elsevier Patient Education  2020 Elsevier Inc.  

## 2019-08-27 ENCOUNTER — Inpatient Hospital Stay: Payer: Medicare HMO

## 2019-08-27 ENCOUNTER — Encounter: Payer: Self-pay | Admitting: Oncology

## 2019-08-27 ENCOUNTER — Telehealth: Payer: Self-pay

## 2019-08-27 ENCOUNTER — Ambulatory Visit (INDEPENDENT_AMBULATORY_CARE_PROVIDER_SITE_OTHER): Payer: Medicare HMO | Admitting: Pharmacist

## 2019-08-27 ENCOUNTER — Inpatient Hospital Stay: Payer: Medicare HMO | Attending: Oncology | Admitting: Oncology

## 2019-08-27 ENCOUNTER — Other Ambulatory Visit: Payer: Self-pay

## 2019-08-27 DIAGNOSIS — E119 Type 2 diabetes mellitus without complications: Secondary | ICD-10-CM

## 2019-08-27 DIAGNOSIS — D735 Infarction of spleen: Secondary | ICD-10-CM

## 2019-08-27 DIAGNOSIS — E538 Deficiency of other specified B group vitamins: Secondary | ICD-10-CM | POA: Diagnosis not present

## 2019-08-27 DIAGNOSIS — D473 Essential (hemorrhagic) thrombocythemia: Secondary | ICD-10-CM | POA: Diagnosis not present

## 2019-08-27 DIAGNOSIS — D75839 Thrombocytosis, unspecified: Secondary | ICD-10-CM

## 2019-08-27 DIAGNOSIS — F17210 Nicotine dependence, cigarettes, uncomplicated: Secondary | ICD-10-CM | POA: Insufficient documentation

## 2019-08-27 DIAGNOSIS — D500 Iron deficiency anemia secondary to blood loss (chronic): Secondary | ICD-10-CM

## 2019-08-27 DIAGNOSIS — I10 Essential (primary) hypertension: Secondary | ICD-10-CM

## 2019-08-27 DIAGNOSIS — D509 Iron deficiency anemia, unspecified: Secondary | ICD-10-CM | POA: Insufficient documentation

## 2019-08-27 DIAGNOSIS — E785 Hyperlipidemia, unspecified: Secondary | ICD-10-CM | POA: Diagnosis not present

## 2019-08-27 DIAGNOSIS — Z79899 Other long term (current) drug therapy: Secondary | ICD-10-CM | POA: Insufficient documentation

## 2019-08-27 LAB — DAT, POLYSPECIFIC AHG (ARMC ONLY): Polyspecific AHG test: NEGATIVE

## 2019-08-27 LAB — FERRITIN: Ferritin: 28 ng/mL (ref 24–336)

## 2019-08-27 LAB — CBC
HCT: 32 % — ABNORMAL LOW (ref 39.0–52.0)
Hemoglobin: 10.4 g/dL — ABNORMAL LOW (ref 13.0–17.0)
MCH: 29.5 pg (ref 26.0–34.0)
MCHC: 32.5 g/dL (ref 30.0–36.0)
MCV: 90.7 fL (ref 80.0–100.0)
Platelets: 565 10*3/uL — ABNORMAL HIGH (ref 150–400)
RBC: 3.53 MIL/uL — ABNORMAL LOW (ref 4.22–5.81)
RDW: 13.4 % (ref 11.5–15.5)
WBC: 8.4 10*3/uL (ref 4.0–10.5)
nRBC: 0 % (ref 0.0–0.2)

## 2019-08-27 LAB — IRON AND TIBC
Iron: 35 ug/dL — ABNORMAL LOW (ref 45–182)
Saturation Ratios: 9 % — ABNORMAL LOW (ref 17.9–39.5)
TIBC: 371 ug/dL (ref 250–450)
UIBC: 336 ug/dL

## 2019-08-27 LAB — FOLATE: Folate: 9.6 ng/mL (ref >5.9)

## 2019-08-27 LAB — RETICULOCYTES
Immature Retic Fract: 26 % — ABNORMAL HIGH (ref 2.3–15.9)
RBC.: 3.5 MIL/uL — ABNORMAL LOW (ref 4.22–5.81)
Retic Count, Absolute: 87.5 10*3/uL (ref 19.0–186.0)
Retic Ct Pct: 2.5 % (ref 0.4–3.1)

## 2019-08-27 LAB — LACTATE DEHYDROGENASE: LDH: 115 U/L (ref 98–192)

## 2019-08-27 LAB — VITAMIN B12: Vitamin B-12: 162 pg/mL — ABNORMAL LOW (ref 180–914)

## 2019-08-27 NOTE — Patient Instructions (Addendum)
[name],   It was great talking to you today!  Remember, our goal A1c is <7% - this is the best way to reduce your risk of long term complications from diabetes. An A1c of <7% corresponds with fasting sugar readings <130 and 2 hour after meal readings <180. Between now and your next appointment with [name], please check your sugar twice daily - fasting (before you eat your first meal of the day) and then 2 hours after supper. Write down these readings on the attached log and bring to your appointment with [name].   See the enclosed handout. Look specifically at the last page, and pay attention to appropriate portion sizes of carbohydrates.   Call me with any questions!  Catie Darnelle Maffucci, PharmD [phone]  Visit Information  Goals Addressed              This Visit's Progress     Patient Stated   .  PharmD "I want to stay healthy" (pt-stated)        CARE PLAN ENTRY (see longitudinal plan of care for additional care plan information)  Current Barriers:  . Diabetes: uncontrolled; complicated by chronic medical conditions including splenic infarct , most recent A1c 11.3% o Reports that he stopped his medications in 2016, and has been drinking so much soda, explaining his significant A1c elevation o Expresses his desire to avoid hypoglycemia - had an episode in the hospital of hypo down to 46 o Expresses his desire to avoid unnecessary medications . Most recent eGFR: >60 mL/min . Current antihyperglycemic regimen: metformin 500 mg BID, Lantus 20 units daily  o Skipped last night's insulin d/t a reading of 106 . Denies any episodes of hypoglycemia  . Reports hyperglycemic symptoms, including polyuria (w/ decreased stream),  . Current meal patterns: o Breakfast: generally skips, wakes up around 9-10 am; sometimes eats a sandwich, sometimes an egg; o Lunch: sandwich, sometimes hamburger o Supper: double cheeseburger (fixed from home); generally fixes supper at the house;  pork chops tonight, mashed taters, corn;  o Drinks: has a "pepsi addiction"; has cut back to 1 fountain drink a day; drinking juice (no sugar added) . Current exercise: mowing the yard, metal detecting  . Current blood glucose readings:  o Fasting: 130-160s o After meals: 140-200s . Cardiovascular risk reduction: o Current hypertensive regimen: metoprolol succinate 25 mg daily  o Current hyperlipidemia regimen: atorvastatin 40 mg daily  o Current antiplatelet regimen: ASA 81 mg daily + clopidogrel 150 mg daily (s/p splenic infarction); confirms he has been holding clopidogrel since discharge for GIB, and will restart tomorrow  Pharmacist Clinical Goal(s):  [name] Over the next 90 days, patient will work with PharmD and primary care provider to address optimized medication management  Interventions: . Comprehensive medication review performed, medication list updated in electronic medical record . Inter-disciplinary care team collaboration (see longitudinal plan of care) . Extensive dietary discussion. Encouraged to switch from soda/juice to water w/ flavoring packets or diet drinks. Discussed doing this will avoid unnecessary medication addition. He verbalizes understanding. Mailing Planning healthy meals handout for patient to review appropriate portion sizes of carbohydrate meals . Reviewed goal A1c, goal fasting, and goal 2 hour post prandial glucose. Patient is to check fasting and 2 hour post supper readings between now and his next PCP visit. Mailing log for his use . Reviewed max dose metformin. Renal function appropriate. Recommend increasing to 1000 mg BID. Will collaborate w/ PCP on this recommendation . Patient worried about overnight hypoglycemia,  leading to skipping insulin. Recommend he move Lantus administration to the morning. He feels more comfortable with this. Will start tomorrow.   Patient Self Care Activities:  . Patient will check blood glucose BID, document, and provide at  future appointments . Patient will take medications as prescribed . Patient will report any questions or concerns to provider   Initial goal documentation        The patient verbalized understanding of instructions provided today and agreed to receive a mailed copy of patient instruction and/or educational materials.  Plan: - Scheduled f/u call in ~ 6 weeks  Catie Darnelle Maffucci, PharmD, [place], [place] Pharmacist [place] [phone]

## 2019-08-27 NOTE — Chronic Care Management (AMB) (Signed)
Chronic Care Management   Note  08/27/2019 Name: Benjamin Conley MRN: [id number] DOB: 1966-01-19   Subjective:  Benjamin Conley is a 54 y.o. year old male who is a primary care patient of [name], NP. The CCM team was consulted for assistance with chronic disease management and care coordination needs.    Contacted patient for medication management review.   Review of patient status, including review of consultants reports, laboratory and other test data, was performed as part of comprehensive evaluation and provision of chronic care management services.   SDOH (Social Determinants of Health) assessments and interventions performed:  no  Objective:  Lab Results  Component Value Date   CREATININE 0.67 08/21/2019   CREATININE 0.70 08/21/2019   CREATININE 0.62 08/19/2019    Lab Results  Component Value Date   HGBA1C 11.3 (H) 08/01/2019       Component Value Date/Time   CHOL 175 08/11/2019 1138   TRIG 184.0 (H) 08/11/2019 1138   HDL 30.80 (L) 08/11/2019 1138   CHOLHDL 6 08/11/2019 1138   VLDL 36.8 08/11/2019 1138   LDLCALC 108 (H) 08/11/2019 1138    Clinical ASCVD: No  The 10-year ASCVD risk score [name], et al., 2013) is: 25.3%   Values used to calculate the score:     Age: 60 years     Sex: Male     Is Non-Hispanic African American: No     Diabetic: Yes     Tobacco smoker: Yes     Systolic Blood Pressure: 916 mmHg     Is BP treated: Yes     HDL Cholesterol: 30.8 mg/dL     Total Cholesterol: 175 mg/dL    BP Readings from Last 3 Encounters:  08/27/19 127/82  08/25/19 113/76  08/21/19 104/73    Allergies  Allergen Reactions   Wasp Venom Anaphylaxis   Wasp Venom Protein Anaphylaxis   Coffee Flavor Nausea And Vomiting    Patient states coffee gives him nausea and stomach cramps   Onion     Medications Reviewed Today    Reviewed by [name], [name] (Pharmacist) on 08/27/19 at 1539  Med List Status: <None>  Medication  Order Taking? Sig Documenting Provider Last Dose Status Informant  aspirin EC 81 MG EC tablet [id number] Yes Take 1 tablet (81 mg total) by mouth daily. [name], MD Taking Active Self  atorvastatin (LIPITOR) 40 MG tablet [id number] Yes Take 1 tablet (40 mg total) by mouth daily. [name], MD Taking Active Self  clopidogrel (PLAVIX) 75 MG tablet [id number] Yes Take 2 tablets (150 mg total) by mouth daily. [name], MD Taking Active   insulin glargine (LANTUS) 100 UNIT/ML Solostar Pen [id number] Yes Inject 20 Units into the skin at bedtime. [name], MD Taking Active Self  metFORMIN (GLUCOPHAGE) 500 MG tablet [id number] Yes Take 1 tablet (500 mg total) by mouth 2 (two) times daily with a meal. [name], MD Taking Active Self  metoprolol succinate (TOPROL XL) 25 MG 24 hr tablet [id number] Yes Take 1 tablet (25 mg total) by mouth daily. [name], MD Taking Active Self  pantoprazole (PROTONIX) 40 MG tablet [id number] Yes Take 1 tablet (40 mg total) by mouth daily. [name], MD Taking Active            Assessment:   Goals Addressed              This Visit's Progress     Patient Stated  PharmD "I want to stay healthy" (pt-stated)        CARE PLAN ENTRY (see longitudinal plan of care for additional care plan information)  Current Barriers:   Diabetes: uncontrolled; complicated by chronic medical conditions including splenic infarct , most recent A1c 11.3% o Reports that he stopped his medications in 2016, and has been drinking so much soda, explaining his significant A1c elevation o Expresses his desire to avoid hypoglycemia - had an episode in the hospital of hypo down to 10 o Expresses his desire to avoid unnecessary medications  Most recent eGFR: >60 mL/min  Current antihyperglycemic regimen: metformin 500 mg BID, Lantus 20 units daily  o Skipped last night's insulin d/t a reading of 106  Denies any episodes of hypoglycemia    Reports hyperglycemic symptoms, including polyuria (w/ decreased stream),   Current meal patterns: o Breakfast: generally skips, wakes up around 9-10 am; sometimes eats a sandwich, sometimes an egg; o Lunch: sandwich, sometimes hamburger o Supper: double cheeseburger (fixed from home); generally fixes supper at the house; pork chops tonight, mashed taters, corn;  o Drinks: has a "pepsi addiction"; has cut back to 1 fountain drink a day; drinking juice (no sugar added)  Current exercise: mowing the yard, metal detecting   Current blood glucose readings:  o Fasting: 130-160s o After meals: 140-200s  Cardiovascular risk reduction: o Current hypertensive regimen: metoprolol succinate 25 mg daily  o Current hyperlipidemia regimen: atorvastatin 40 mg daily  o Current antiplatelet regimen: ASA 81 mg daily + clopidogrel 150 mg daily (s/p splenic infarction); confirms he has been holding clopidogrel since discharge for GIB, and will restart tomorrow  Pharmacist Clinical Goal(s):   Over the next 90 days, patient will work with PharmD and primary care provider to address optimized medication management  Interventions:  Comprehensive medication review performed, medication list updated in electronic medical record  Inter-disciplinary care team collaboration (see longitudinal plan of care)  Extensive dietary discussion. Encouraged to switch from soda/juice to water w/ flavoring packets or diet drinks. Discussed doing this will avoid unnecessary medication addition. He verbalizes understanding. Mailing Planning healthy meals handout for patient to review appropriate portion sizes of carbohydrate meals  Reviewed goal A1c, goal fasting, and goal 2 hour post prandial glucose. Patient is to check fasting and 2 hour post supper readings between now and his next PCP visit. Mailing log for his use  Reviewed max dose metformin. Renal function appropriate. Recommend increasing to 1000 mg BID. Will  collaborate w/ PCP on this recommendation  Patient worried about overnight hypoglycemia, leading to skipping insulin. Recommend he move Lantus administration to the morning. He feels more comfortable with this. Will start tomorrow.   Patient Self Care Activities:   Patient will check blood glucose BID, document, and provide at future appointments  Patient will take medications as prescribed  Patient will report any questions or concerns to provider   Initial goal documentation        Plan: - Scheduled f/u call in ~ 6 weeks  Catie Darnelle Maffucci, PharmD, [place], [place] Pharmacist [place] [phone]

## 2019-08-27 NOTE — Telephone Encounter (Signed)
Transition Care Management Follow-up Telephone Call  Date of discharge and from where: 08/25/19 from [facility]  How have you been since you were released from the hospital? Patient states, "I am doing pretty good, I feel better." Denies site of bloody stool, chest pain, SOB, abd pain, n/v/d and all other symptoms.   Any questions or concerns? L hand knuckles slightly swelling since yesterday, wrist a little stiff since IV was removed from the area. Notes he was "stuck" just above the L wrist. Denies redness, streaks, pain, cool to touch/feeling. Encouraged to go to ED if symptoms persist or worsen.    Items Reviewed:  Did the pt receive and understand the discharge instructions provided? Increase activity as tolerated.  Medications obtained and verified? Yes, restart taking aspirin but do not take plavix for 3 days starting 08/25/19. Taking all other scheduled medications as directed.   Any new allergies since your discharge? No  Dietary orders reviewed? Heart healthy/ modified carb  Do you have support at home? Yes, brother  Functional Questionnaire: (I = Independent and D = Dependent) ADLs: i  Bathing/Dressing- i  Meal Prep- i  Eating- i  Maintaining continence- i  Transferring/Ambulation- i  Managing Meds- i  Follow up appointments reviewed:   PCP Hospital f/u appt confirmed? Scheduled to see [name] on 09/06/19 @ 11:00.  Sooner appointment offered, declined.   [facility] f/u appt confirmed? Scheduled to see Cardiology and will schedule with GI, Dr. Allen Norris [phone].   Are transportation arrangements needed? No  If their condition worsens, is the pt aware to call PCP or go to the Emergency Dept.? Yes  Was the patient provided with contact information for the PCP's office or ED? Yes  Was to pt encouraged to call back with questions or concerns? Yes

## 2019-08-27 NOTE — Progress Notes (Signed)
Pt here for first visit at [facility]. Questions and concerns gone over. Pt has no specific questions or concerns at this time.

## 2019-08-28 LAB — HAPTOGLOBIN: Haptoglobin: 273 mg/dL (ref 29–370)

## 2019-08-29 ENCOUNTER — Other Ambulatory Visit: Payer: Self-pay

## 2019-08-29 ENCOUNTER — Emergency Department: Payer: Medicare HMO

## 2019-08-29 ENCOUNTER — Encounter: Payer: Self-pay | Admitting: Emergency Medicine

## 2019-08-29 ENCOUNTER — Emergency Department
Admission: EM | Admit: 2019-08-29 | Discharge: 2019-08-29 | Disposition: A | Discharge status: Home / Self Care | Payer: Medicare HMO | Attending: Emergency Medicine | Admitting: Emergency Medicine

## 2019-08-29 DIAGNOSIS — Z794 Long term (current) use of insulin: Secondary | ICD-10-CM | POA: Insufficient documentation

## 2019-08-29 DIAGNOSIS — J449 Chronic obstructive pulmonary disease, unspecified: Secondary | ICD-10-CM | POA: Diagnosis not present

## 2019-08-29 DIAGNOSIS — D500 Iron deficiency anemia secondary to blood loss (chronic): Secondary | ICD-10-CM | POA: Insufficient documentation

## 2019-08-29 DIAGNOSIS — M795 Residual foreign body in soft tissue: Secondary | ICD-10-CM

## 2019-08-29 DIAGNOSIS — M25532 Pain in left wrist: Secondary | ICD-10-CM | POA: Diagnosis present

## 2019-08-29 DIAGNOSIS — F17210 Nicotine dependence, cigarettes, uncomplicated: Secondary | ICD-10-CM | POA: Diagnosis not present

## 2019-08-29 DIAGNOSIS — I10 Essential (primary) hypertension: Secondary | ICD-10-CM | POA: Insufficient documentation

## 2019-08-29 DIAGNOSIS — Z7982 Long term (current) use of aspirin: Secondary | ICD-10-CM | POA: Insufficient documentation

## 2019-08-29 DIAGNOSIS — M7989 Other specified soft tissue disorders: Secondary | ICD-10-CM | POA: Diagnosis not present

## 2019-08-29 DIAGNOSIS — Z79899 Other long term (current) drug therapy: Secondary | ICD-10-CM | POA: Diagnosis not present

## 2019-08-29 DIAGNOSIS — I808 Phlebitis and thrombophlebitis of other sites: Secondary | ICD-10-CM

## 2019-08-29 DIAGNOSIS — E119 Type 2 diabetes mellitus without complications: Secondary | ICD-10-CM | POA: Diagnosis not present

## 2019-08-29 DIAGNOSIS — S60852A Superficial foreign body of left wrist, initial encounter: Secondary | ICD-10-CM | POA: Diagnosis not present

## 2019-08-29 MED ORDER — CEPHALEXIN 500 MG PO CAPS
500.0000 mg | ORAL_CAPSULE | Freq: Three times a day (TID) | ORAL | 0 refills | Status: DC
Start: 2019-08-29 — End: 2019-09-06

## 2019-08-29 NOTE — ED Provider Notes (Signed)
[facility] Emergency Department Provider Note  ____________________________________________   First MD Initiated Contact with Patient 08/29/19 1114     (approximate)  I have reviewed the triage vital signs and the nursing notes.   HISTORY  Chief Complaint Wrist Pain    HPI Benjamin Conley is a 54 y.o. male presents emergency department with concerns of an IV catheter being stuck in the wrist.  States that he hit his arm against the bed catheter stop up in his arm and then the nurse removed it.  States he feels like part of the cath may still be in his arm.  Area became very swollen and hard.  Now he can feel a small straight hard object in the dorsum of the left wrist.  No fever or chills.  No drainage from site.    Past Medical History:  Diagnosis Date  . Complication of anesthesia    c/o difficulty breathing after anesthesia  . COPD (chronic obstructive pulmonary disease) ([place])   . Diabetes mellitus without complication ([place])   . Hyperlipemia   . 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 Tobacco abuse     Patient Active Problem List   Diagnosis Date Noted  . Iron deficiency anemia due to chronic blood loss 08/29/2019  . Thrombocytosis ([place]) 08/24/2019  . Acute posthemorrhagic anemia   . Angiodysplasia of intestinal tract   . Gastrointestinal hemorrhage with melena 08/22/2019  . Melena 08/21/2019  . Postural dizziness with presyncope 08/21/2019  . Presence of arterial stent - splenic artery 08/21/2019  . Upper GI bleed 08/21/2019  . Paroxysmal supraventricular tachycardia ([place]) 08/19/2019  . Dizziness 08/19/2019  . Sinus tachycardia 08/19/2019  . Chest pain 08/19/2019  . 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  . COPD with chronic bronchitis ([place]) 08/01/2019  . Tobacco abuse 08/01/2019  . Diabetes mellitus without complication ([place])   . Hyperlipemia   . Hypertension 11/10/2013    Past Surgical History:  Procedure Laterality Date  . BACK SURGERY      lumbar  . COLONOSCOPY WITH PROPOFOL N/A 08/24/2019   Procedure: COLONOSCOPY WITH PROPOFOL;  Surgeon: [name], MD;  [facility] ENDOSCOPY;  Service: Endoscopy;  Laterality: N/A;  . ESOPHAGOGASTRODUODENOSCOPY (EGD) WITH PROPOFOL N/A 08/24/2019   Procedure: ESOPHAGOGASTRODUODENOSCOPY (EGD) WITH PROPOFOL;  Surgeon: [name], MD;  Location: [facility] ENDOSCOPY;  Service: Endoscopy;  Laterality: N/A;  . STENT PLACEMENT VASCULAR ([place] [facility])  07/2019   stenosis of distal splenic artery and stent placed  . VISCERAL ANGIOGRAPHY N/A 08/06/2019   Procedure: VISCERAL ANGIOGRAPHY;  Surgeon: [name], MD;  Location: [facility] CV LAB;  Service: Cardiovascular;  Laterality: N/A;    Prior to Admission medications   Medication Sig Start Date End Date Taking? Authorizing Provider  aspirin EC 81 MG EC tablet Take 1 tablet (81 mg total) by mouth daily. 08/07/19   [name], MD  atorvastatin (LIPITOR) 40 MG tablet Take 1 tablet (40 mg total) by mouth daily. 08/07/19   [name], MD  cephALEXin (KEFLEX) 500 MG capsule Take 1 capsule (500 mg total) by mouth 3 (three) times daily. 08/29/19   Caryn Section Linden Dolin, PA-C  clopidogrel (PLAVIX) 75 MG tablet Take 2 tablets (150 mg total) by mouth daily. 08/25/19   [name], MD  insulin glargine (LANTUS) 100 UNIT/ML Solostar Pen Inject 20 Units into the skin at bedtime. 08/07/19   [name], MD  metFORMIN (GLUCOPHAGE) 500 MG tablet Take 1 tablet (500 mg total) by mouth 2 (two) times daily  with a meal. 08/07/19   [name], MD  metoprolol succinate (TOPROL XL) 25 MG 24 hr tablet Take 1 tablet (25 mg total) by mouth daily. 08/20/19   [name], MD  pantoprazole (PROTONIX) 40 MG tablet Take 1 tablet (40 mg total) by mouth daily. 08/25/19 09/24/19  [name], MD    Allergies Wasp venom, Wasp venom protein, Coffee flavor, and Onion  Family History  Problem Relation Age of Onset  . Diabetes Mellitus II Mother   . Diabetes  Mellitus II Brother   . Hyperlipidemia Brother   . Colon cancer Maternal Aunt   . Colon cancer Maternal Uncle     Social History Social History   Tobacco Use  . Smoking status: Current Every Day Smoker    Packs/day: 2.00    Years: 48.00    Pack years: 96.00    Types: Cigarettes  . Smokeless tobacco: Never Used  Vaping Use  . Vaping Use: Never used  Substance Use Topics  . Alcohol use: No    Comment: Never been a problem  . Drug use: No    Review of Systems  Constitutional: No fever/chills Eyes: No visual changes. ENT: No sore throat. Respiratory: Denies cough Cardiovascular: Denies chest pain Gastrointestinal: Denies abdominal pain Genitourinary: Negative for dysuria. Musculoskeletal: Negative for back pain.  Questionable foreign body in the left wrist Skin: Negative for rash. Psychiatric: no mood changes,     ____________________________________________   PHYSICAL EXAM:  VITAL SIGNS: ED Triage Vitals  Enc Vitals Group     BP 08/29/19 1010 132/80     Pulse Rate 08/29/19 1010 91     Resp 08/29/19 1010 16     Temp 08/29/19 1010 98.3 F (36.8 C)     Temp Source 08/29/19 1010 Oral     SpO2 08/29/19 1010 100 %     Weight 08/29/19 1008 175 lb (79.4 kg)     Height 08/29/19 1008 5\' 4"  (1.626 m)     Head Circumference --      Peak Flow --      Pain Score 08/29/19 1008 2     Pain Loc --      Pain Edu? --      Excl. in [place]? --     Constitutional: Alert and oriented. Well appearing and in no acute distress. Eyes: Conjunctivae are normal.  Head: Atraumatic. Nose: No congestion/rhinnorhea. Mouth/Throat: Mucous membranes are moist.   Neck:  supple no lymphadenopathy noted Cardiovascular: Normal rate, regular rhythm.  Respiratory: Normal respiratory effort.  No retractions,  GU: deferred Musculoskeletal: FROM all extremities, warm and well perfused, left wrist does have a 1 inch hard area noted along the vein of the left wrist, area is mobile, no pus or  drainage is noted.  Minimal swelling noted Neurologic:  Normal speech and language.  Skin:  Skin is warm, dry and intact. No rash noted. Psychiatric: Mood and affect are normal. Speech and behavior are normal.  ____________________________________________   LABS (all labs ordered are listed, but only abnormal results are displayed)  Labs Reviewed - No data to display ____________________________________________   ____________________________________________  RADIOLOGY  Ultrasound soft tissue left wrist shows thrombophlebitis of the left wrist  ____________________________________________   PROCEDURES  Procedure(s) performed: No  Procedures    ____________________________________________   INITIAL IMPRESSION / ASSESSMENT AND PLAN / ED COURSE  Pertinent labs & imaging results that were available during my care of the patient were reviewed by me and considered in my  medical decision making (see chart for details).   Patient is 54 year old male presents emergency department with concerns of left wrist pain and feeling like the catheter from his IV is still stuck in his wrist.  See HPI.  Physical exam shows the patient to appear well.  Area is a little tender you do feel a hard sensation along the vein.  Ultrasound soft tissue of the left wrist does show thrombophlebitis but no foreign body.  I explained this to the patient.  He is currently on Plavix and aspirin he should continue these medications.  Is given prescription for Keflex to prevent infection.  Follow-up with his regular doctor this week for recheck.  Return emergency department worsening.  Is discharged stable condition.    Benjamin Conley was evaluated in Emergency Department on 08/29/2019 for the symptoms described in the history of present illness. He was evaluated in the context of the global COVID-19 pandemic, which necessitated consideration that the patient might be at risk for infection with the  SARS-CoV-2 virus that causes COVID-19. Institutional protocols and algorithms that pertain to the evaluation of patients at risk for COVID-19 are in a state of rapid change based on information released by regulatory bodies including the CDC and federal and state organizations. These policies and algorithms were followed during the patient's care in the ED.   As part of my medical decision making, I reviewed the following data within the [place] notes reviewed and incorporated, Old chart reviewed, Radiograph reviewed , Notes from prior ED visits and [place] Controlled Substance Database  ____________________________________________   FINAL CLINICAL IMPRESSION(S) / ED DIAGNOSES  Final diagnoses:  Foreign body (FB) in soft tissue  Thrombophlebitis of arm, left      NEW MEDICATIONS STARTED DURING THIS VISIT:  New Prescriptions   CEPHALEXIN (KEFLEX) 500 MG CAPSULE    Take 1 capsule (500 mg total) by mouth 3 (three) times daily.     Note:  This document was prepared using Dragon voice recognition software and may include unintentional dictation errors.    Versie Starks, PA-C 08/29/19 1316    Blake Divine, MD 08/29/19 [phone]

## 2019-08-29 NOTE — ED Notes (Signed)
See triage note, pt states "I think I have an IV catheter stuck in my wrist". Reports burning to left wrist. No swelling or irritation noted to area.  Pt reports he was in the hospital on Wednesday and RN did take the IV out when he was in the hospital.

## 2019-08-29 NOTE — ED Triage Notes (Signed)
Pt to ED via POV states that he was in the hospital last week. Pt reports that he had an IV in his left wrist. Pt states that he hit his wrist on the bed and the IV became dislodged. Pt states that the nurse came in and removed the IV. Pt states that yesterday he noted swelling in the area and this morning the area feels hard. Pt wants to be sure that the catheter was not left in his arm. Pt is in NAD.

## 2019-08-29 NOTE — ED Notes (Signed)
First Nurse Note: Pt to ED via POV, pt states that he thinks that there is an IV catheter in his left wrist. Pt is in NAD.

## 2019-08-29 NOTE — Discharge Instructions (Addendum)
Continue to take your Plavix and aspirin daily.  You have been placed on antibiotic to prevent infection.  If worsening return emergency department. This is a superficial thrombophlebitis which is not dangerous.  Follow-up with your doctor in 3 to 4 days for a follow-up appointment.

## 2019-08-30 ENCOUNTER — Encounter
Admission: RE | Admit: 2019-08-30 | Discharge: 2019-08-30 | Disposition: A | Discharge status: Home / Self Care | Payer: Medicare HMO | Source: Ambulatory Visit | Attending: Cardiology | Admitting: Cardiology

## 2019-08-30 ENCOUNTER — Telehealth: Payer: Self-pay | Admitting: Cardiology

## 2019-08-30 ENCOUNTER — Other Ambulatory Visit: Payer: Self-pay | Admitting: Nurse Practitioner

## 2019-08-30 DIAGNOSIS — R000 Tachycardia, unspecified: Secondary | ICD-10-CM | POA: Insufficient documentation

## 2019-08-30 DIAGNOSIS — R079 Chest pain, unspecified: Secondary | ICD-10-CM

## 2019-08-30 DIAGNOSIS — I4711 Inappropriate sinus tachycardia, so stated: Secondary | ICD-10-CM

## 2019-08-30 DIAGNOSIS — R0600 Dyspnea, unspecified: Secondary | ICD-10-CM | POA: Insufficient documentation

## 2019-08-30 DIAGNOSIS — R0609 Other forms of dyspnea: Secondary | ICD-10-CM

## 2019-08-30 MED ORDER — METFORMIN HCL 1000 MG PO TABS
1000.0000 mg | ORAL_TABLET | Freq: Two times a day (BID) | ORAL | 3 refills | Status: DC
Start: 2019-08-30 — End: 2019-09-07

## 2019-08-30 NOTE — Telephone Encounter (Signed)
Patient states he smoked a cigarrete this morning and was unable to take his stress test. He would like to Dr. Garen Lah to call him something in to help him stop smoking.

## 2019-08-30 NOTE — Progress Notes (Signed)
Increased his dose of metformin to 1 gm twice daily with meals. Monitor for GI disturbance and diarrhea. F/up visit 09/06/19.

## 2019-08-31 ENCOUNTER — Telehealth: Payer: Self-pay | Admitting: Nurse Practitioner

## 2019-08-31 NOTE — Telephone Encounter (Signed)
Patient aware of rx dose change and advised to bring in all medications to next OV

## 2019-08-31 NOTE — Telephone Encounter (Signed)
Please call him and advise that he increase his Metformin to 1000 mg twice daily. He hs 500 mg tabs now. I re- ordered it at 1000 mg. Also, ask him to put all of his medicines in a bag and bring in to me at next OV.

## 2019-09-01 ENCOUNTER — Other Ambulatory Visit: Payer: Self-pay

## 2019-09-01 ENCOUNTER — Encounter: Payer: Self-pay | Admitting: Nurse Practitioner

## 2019-09-01 ENCOUNTER — Encounter
Admission: RE | Admit: 2019-09-01 | Discharge: 2019-09-01 | Disposition: A | Discharge status: Home / Self Care | Payer: Medicare HMO | Source: Ambulatory Visit | Attending: Cardiology | Admitting: Cardiology

## 2019-09-01 DIAGNOSIS — R079 Chest pain, unspecified: Secondary | ICD-10-CM | POA: Insufficient documentation

## 2019-09-01 DIAGNOSIS — R0600 Dyspnea, unspecified: Secondary | ICD-10-CM | POA: Diagnosis not present

## 2019-09-01 DIAGNOSIS — R000 Tachycardia, unspecified: Secondary | ICD-10-CM | POA: Diagnosis not present

## 2019-09-01 LAB — NM MYOCAR MULTI W/SPECT W/WALL MOTION / EF
Estimated workload: 1 METS
Exercise duration (min): 0 min
Exercise duration (sec): 0 s
LV dias vol: 103 mL (ref 62–150)
LV sys vol: 42 mL
MPHR: 167 {beats}/min
Peak HR: 117 {beats}/min
Percent HR: 70 %
Rest HR: 85 {beats}/min
TID: 1.03

## 2019-09-01 LAB — JAK2 GENOTYPR

## 2019-09-01 MED ORDER — TECHNETIUM TC 99M TETROFOSMIN IV KIT
32.0800 | PACK | Freq: Once | INTRAVENOUS | Status: AC | PRN
  Administered 2019-09-01: 32.08 via INTRAVENOUS

## 2019-09-01 MED ORDER — REGADENOSON 0.4 MG/5ML IV SOLN
0.4000 mg | Freq: Once | INTRAVENOUS | Status: AC
  Administered 2019-09-01: 0.4 mg via INTRAVENOUS

## 2019-09-01 MED ORDER — TECHNETIUM TC 99M TETROFOSMIN IV KIT
10.0000 | PACK | Freq: Once | INTRAVENOUS | Status: AC | PRN
  Administered 2019-09-01: 10.82 via INTRAVENOUS

## 2019-09-01 NOTE — Telephone Encounter (Signed)
I called him with dose back down to Metformin 500 mg BID again until diarrhea improves. I will see him in the office. He is feeling well.

## 2019-09-06 ENCOUNTER — Ambulatory Visit (INDEPENDENT_AMBULATORY_CARE_PROVIDER_SITE_OTHER): Payer: Medicare HMO | Admitting: Nurse Practitioner

## 2019-09-06 ENCOUNTER — Other Ambulatory Visit: Payer: Self-pay

## 2019-09-06 ENCOUNTER — Encounter: Payer: Self-pay | Admitting: Podiatry

## 2019-09-06 ENCOUNTER — Ambulatory Visit (INDEPENDENT_AMBULATORY_CARE_PROVIDER_SITE_OTHER): Payer: Medicare HMO | Admitting: Podiatry

## 2019-09-06 ENCOUNTER — Encounter: Payer: Self-pay | Admitting: Nurse Practitioner

## 2019-09-06 VITALS — BP 112/72 | HR 97 | Temp 98.1°F | Ht 64.0 in | Wt 175.1 lb

## 2019-09-06 DIAGNOSIS — D689 Coagulation defect, unspecified: Secondary | ICD-10-CM

## 2019-09-06 DIAGNOSIS — R42 Dizziness and giddiness: Secondary | ICD-10-CM

## 2019-09-06 DIAGNOSIS — B351 Tinea unguium: Secondary | ICD-10-CM | POA: Diagnosis not present

## 2019-09-06 DIAGNOSIS — E119 Type 2 diabetes mellitus without complications: Secondary | ICD-10-CM | POA: Diagnosis not present

## 2019-09-06 DIAGNOSIS — M79674 Pain in right toe(s): Secondary | ICD-10-CM

## 2019-09-06 DIAGNOSIS — D735 Infarction of spleen: Secondary | ICD-10-CM | POA: Diagnosis not present

## 2019-09-06 DIAGNOSIS — K922 Gastrointestinal hemorrhage, unspecified: Secondary | ICD-10-CM | POA: Diagnosis not present

## 2019-09-06 DIAGNOSIS — E1140 Type 2 diabetes mellitus with diabetic neuropathy, unspecified: Secondary | ICD-10-CM | POA: Insufficient documentation

## 2019-09-06 DIAGNOSIS — M79675 Pain in left toe(s): Secondary | ICD-10-CM

## 2019-09-06 DIAGNOSIS — IMO0002 Reserved for concepts with insufficient information to code with codable children: Secondary | ICD-10-CM | POA: Insufficient documentation

## 2019-09-06 DIAGNOSIS — E1142 Type 2 diabetes mellitus with diabetic polyneuropathy: Secondary | ICD-10-CM

## 2019-09-06 LAB — CBC WITH DIFFERENTIAL/PLATELET
Basophils Absolute: 0.1 10*3/uL (ref 0.0–0.1)
Basophils Relative: 0.8 % (ref 0.0–3.0)
Eosinophils Absolute: 0.1 10*3/uL (ref 0.0–0.7)
Eosinophils Relative: 1 % (ref 0.0–5.0)
HCT: 36.5 % — ABNORMAL LOW (ref 39.0–52.0)
Hemoglobin: 12.1 g/dL — ABNORMAL LOW (ref 13.0–17.0)
Lymphocytes Relative: 37.2 % (ref 12.0–46.0)
Lymphs Abs: 3.2 10*3/uL (ref 0.7–4.0)
MCHC: 33.2 g/dL (ref 30.0–36.0)
MCV: 90.7 fl (ref 78.0–100.0)
Monocytes Absolute: 0.7 10*3/uL (ref 0.1–1.0)
Monocytes Relative: 7.8 % (ref 3.0–12.0)
Neutro Abs: 4.7 10*3/uL (ref 1.4–7.7)
Neutrophils Relative %: 53.2 % (ref 43.0–77.0)
Platelets: 448 10*3/uL — ABNORMAL HIGH (ref 150.0–400.0)
RBC: 4.03 Mil/uL — ABNORMAL LOW (ref 4.22–5.81)
RDW: 14.3 % (ref 11.5–15.5)
WBC: 8.7 10*3/uL (ref 4.0–10.5)

## 2019-09-06 LAB — COMPREHENSIVE METABOLIC PANEL
ALT: 17 U/L (ref 0–53)
AST: 11 U/L (ref 0–37)
Albumin: 4.5 g/dL (ref 3.5–5.2)
Alkaline Phosphatase: 79 U/L (ref 39–117)
BUN: 14 mg/dL (ref 6–23)
CO2: 31 mEq/L (ref 19–32)
Calcium: 9.9 mg/dL (ref 8.4–10.5)
Chloride: 99 mEq/L (ref 96–112)
Creatinine, Ser: 0.62 mg/dL (ref 0.40–1.50)
GFR: 135.25 mL/min (ref >60.00)
Glucose, Bld: 180 mg/dL — ABNORMAL HIGH (ref 70–99)
Potassium: 4.7 mEq/L (ref 3.5–5.1)
Sodium: 137 mEq/L (ref 135–145)
Total Bilirubin: 0.3 mg/dL (ref 0.2–1.2)
Total Protein: 7.4 g/dL (ref 6.0–8.3)

## 2019-09-06 NOTE — Progress Notes (Signed)
Established Patient Office Visit  Subjective:  Patient ID: Benjamin Conley, male    DOB: 10/10/1965  Age: 54 y.o. MRN: [id number]  CC:  Chief Complaint  Patient presents with  . Follow-up    hospital follow up    HPI Benjamin Conley is a 54 year old with history of uncontrolled diabetes mellitus, hypertension, HLD, tobacco dependence, splenic artery is stented 08/06/2019, presents for follow-up of chronic health problems.  He is followed by several specialists: Vascular surgery, Hematology, Cardiology, Chronic Care Management.   He had a recent hospital admission for upper GI bleed.  The EGD showed AVMs without bleeding in the duodenum treated with argon plasma.  The colonoscopy showed a 4 mm polyp, non bleeding internal hemorrhoids and 2 non bleeding colonic AVMs.  Patient was restarted on his aspirin and Plavix.  He was also seen in the emergency room 08/29/19 for forearm IV site vein stiffness. Dx as thrombophlebitis he was given a prescription for Keflex but did not start.  He has had no signs of infection at that site.  T2DM: Metformin 500 mg BID- doing Ok.  He did not tolerate 1 g twice daily as he had severe diarrhea. He will cut those 1 gm tablets in half.  We backed him down to what he is comfortable with.  He is taking Lantus 15 units in the morning and he checked his blood sugar- last recalls 216 and this FBS 164. No lows at home. He did have lows in the hospital: His body felt shakey, hot all over, could hardly move when has blood sugar fell to 46. He felt hot, sweaty, shakey when his blood sugar was 64 in the hospital. He does have glucose tablets, in the house and knows how to use them.He is eating regular meals. He is still weak and slowly gaining his strength back. Check met b today.   Lab Results  Component Value Date   HGBA1C 11.3 (H) 08/01/2019   Hx of UGI bleed and AVM's :  Pantoprazole 40 mg daily- indefinite use. He is not passing any black stool. No abdomen pain or  cramps. No BM since he had diarrhea from increased doses of Metformin. He does not feel constipated. Eating normally- gained one pound. No LUQ pain. No N/V.  He has an appointment with Hematology to receive IV iron treatments and B12. Check Hgb today.   NEURO:He still gets a little dizzy if tilts his head back or if kneeling over and raises up. Neurology referral was placed for MRI changes showing periventricular and scattered subcortical T2 hyperintensities bilaterally.  Patient reports that several years ago he was coughing and noticed he lost the vision in his eyes. This is happened 2017.   HTN: On metoprolol XL 25 mg per day. He has had no chest pain, shortness of breath, DOE.  Dizziness is better, but still happens occasionally.  Orthostatic vital signs: Supine 110/80 pulse 78 Sitting 108/72 pulse 106 Standing 104/68 pulse 112  HLD: Presents on atorvastatin 40 mg daily- started 08/07/19. F/up with Cardiology  Lab Results  Component Value Date   CHOL 175 08/11/2019   HDL 30.80 (L) 08/11/2019   LDLCALC 108 (H) 08/11/2019   TRIG 184.0 (H) 08/11/2019   CHOLHDL 6 08/11/2019   Tobacco: He and his brother make their own cigarettes in the living room. Not ready to quit, yet. Will talk to is brother to see if they can get that out of the house- too tempting otherwise.   Past  Medical History:  Diagnosis Date  . Complication of anesthesia    c/o difficulty breathing after anesthesia  . COPD (chronic obstructive pulmonary disease) ([place])   . Diabetes mellitus without complication ([place])   . Hyperlipemia   . 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 Tobacco abuse     Past Surgical History:  Procedure Laterality Date  . BACK SURGERY     lumbar  . COLONOSCOPY WITH PROPOFOL N/A 08/24/2019   Procedure: COLONOSCOPY WITH PROPOFOL;  Surgeon: [name], MD;  Location: [facility] ENDOSCOPY;  Service: Endoscopy;  Laterality: N/A;  . ESOPHAGOGASTRODUODENOSCOPY (EGD) WITH PROPOFOL N/A 08/24/2019   Procedure:  ESOPHAGOGASTRODUODENOSCOPY (EGD) WITH PROPOFOL;  Surgeon: [name], MD;  Location: [facility] ENDOSCOPY;  Service: Endoscopy;  Laterality: N/A;  . STENT PLACEMENT VASCULAR ([place] [facility])  07/2019   stenosis of distal splenic artery and stent placed  . VISCERAL ANGIOGRAPHY N/A 08/06/2019   Procedure: VISCERAL ANGIOGRAPHY;  Surgeon: [name], MD;  Location: [place] CV LAB;  Service: Cardiovascular;  Laterality: N/A;    Family History  Problem Relation Age of Onset  . Diabetes Mellitus II Mother   . Diabetes Mellitus II Brother   . Hyperlipidemia Brother   . Colon cancer Maternal Aunt   . Colon cancer Maternal Uncle     Social History   Socioeconomic History  . Marital status: Single    Spouse name: Not on file  . Number of children: Not on file  . Years of education: Not on file  . Highest education level: Not on file  Occupational History  . Not on file  Tobacco Use  . Smoking status: Current Every Day Smoker    Packs/day: 2.00    Years: 48.00    Pack years: 96.00    Types: Cigarettes  . Smokeless tobacco: Never Used  Vaping Use  . Vaping Use: Never used  Substance and Sexual Activity  . Alcohol use: No    Comment: Never been a problem  . Drug use: No  . Sexual activity: Yes  Other Topics Concern  . Not on file  Social History Narrative   Single, lives alone, unemployed.   Social Determinants of Health   Financial Resource Strain:   . Difficulty of Paying Living Expenses:   Food Insecurity:   . Worried About Charity fundraiser in the Last Year:   . Arboriculturist in the Last Year:   Transportation Needs:   . Film/video editor (Medical):   [name] Lack of Transportation (Non-Medical):   Physical Activity:   . Days of Exercise per Week:   . Minutes of Exercise per Session:   Stress:   . Feeling of Stress :   Social Connections:   . Frequency of Communication with Friends and Family:   . Frequency of Social Gatherings with Friends and Family:   .  Attends Religious Services:   . Active Member of Clubs or Organizations:   . Attends Archivist Meetings:   [name] Marital Status:   Intimate Partner Violence:   . Fear of Current or Ex-Partner:   . Emotionally Abused:   [name] Physically Abused:   . Sexually Abused:     Outpatient Medications Prior to Visit  Medication Sig Dispense Refill  . aspirin EC 81 MG EC tablet Take 1 tablet (81 mg total) by mouth daily. 90 tablet 1  . atorvastatin (LIPITOR) 40 MG tablet Take 1 tablet (40 mg total) by mouth daily. 90 tablet 1  . clopidogrel (  PLAVIX) 75 MG tablet Take 2 tablets (150 mg total) by mouth daily. 90 tablet 1  . insulin glargine (LANTUS) 100 UNIT/ML Solostar Pen Inject 20 Units into the skin at bedtime. 15 mL 11  . metoprolol succinate (TOPROL XL) 25 MG 24 hr tablet Take 1 tablet (25 mg total) by mouth daily. 90 tablet 3  . ofloxacin (OCUFLOX) 0.3 % ophthalmic solution ofloxacin 0.3 % eye drops  INSTILL 1 DROP INTO LEFT EYE 4 TIMES DAILY    . pantoprazole (PROTONIX) 40 MG tablet Take 1 tablet (40 mg total) by mouth daily. 30 tablet 0  . cephALEXin (KEFLEX) 500 MG capsule Take 1 capsule (500 mg total) by mouth 3 (three) times daily. 21 capsule 0  . metFORMIN (GLUCOPHAGE) 1000 MG tablet Take 1 tablet (1,000 mg total) by mouth 2 (two) times daily with a meal. 180 tablet 3  . cyclobenzaprine (FLEXERIL) 10 MG tablet cyclobenzaprine 10 mg tablet    . meloxicam (MOBIC) 15 MG tablet meloxicam 15 mg tablet     No facility-administered medications prior to visit.    Allergies  Allergen Reactions  . Wasp Venom Anaphylaxis  . Wasp Venom Protein Anaphylaxis  . Coffee Flavor Nausea And Vomiting    Patient states coffee gives him nausea and stomach cramps  . Onion    Review of Systems  Constitutional: Negative for chills and fever.  HENT: Negative.   Eyes: Negative.   Respiratory: Positive for cough.        Chronic smokers cough  Cardiovascular: Positive for chest pain. Negative for  palpitations and leg swelling.  Gastrointestinal: Negative for abdominal pain, blood in stool, constipation, diarrhea and nausea.  Endocrine: Negative for cold intolerance, heat intolerance, polydipsia and polyuria.  Genitourinary: Negative for difficulty urinating and hematuria.  Musculoskeletal: Negative.        His concern is toenails turn black and fall off. Feet in poor condition. Never wears socks. Shoes are worn out.  Allergic/Immunologic: Negative.   Neurological: Positive for dizziness. Negative for seizures, light-headedness and headaches.  Hematological: Negative for adenopathy. Bruises/bleeds easily.  Psychiatric/Behavioral:       No concerns with depression/anxiety.    Pertinent positives none history of present illness otherwise negative.   Objective:    Physical Exam Vitals reviewed.  Constitutional:      Appearance: Normal appearance.  HENT:     Head: Normocephalic.  Eyes:     Pupils: Pupils are equal, round, and reactive to light.  Cardiovascular:     Rate and Rhythm: Normal rate and regular rhythm.     Pulses: Normal pulses.     Heart sounds: Normal heart sounds.  Pulmonary:     Effort: Pulmonary effort is normal.     Breath sounds: Normal breath sounds. No wheezing or rales.  Abdominal:     Palpations: Abdomen is soft.     Tenderness: There is no abdominal tenderness.  Musculoskeletal:        General: Normal range of motion.     Cervical back: Normal range of motion and neck supple.     Comments: Feet with dark nailbeds  Skin:    General: Skin is warm and dry.  Neurological:     General: No focal deficit present.     Mental Status: He is alert and oriented to person, place, and time.  Psychiatric:        Mood and Affect: Mood normal.        Behavior: Behavior normal.  BP 112/72 (BP Location: Left Arm, Patient Position: Sitting, Cuff Size: Normal)   Pulse 97   Temp 98.1 F (36.7 C) (Skin)   Ht 5' 4"  (1.626 m)   Wt 175 lb 1.6 oz (79.4 kg)    SpO2 98%   BMI 30.06 kg/m  Wt Readings from Last 3 Encounters:  09/06/19 175 lb 1.6 oz (79.4 kg)  08/29/19 175 lb (79.4 kg)  08/27/19 175 lb 3.2 oz (79.5 kg)     Health Maintenance Due  Topic Date Due  . Hepatitis C Screening  Never done  . PNEUMOCOCCAL POLYSACCHARIDE VACCINE AGE 25-64 HIGH RISK  Never done  . FOOT EXAM  Never done  . OPHTHALMOLOGY EXAM  Never done  . COVID-19 Vaccine (1) Never done    There are no preventive care reminders to display for this patient.  Lab Results  Component Value Date   TSH 1.68 08/20/2019   Lab Results  Component Value Date   WBC 8.7 09/06/2019   HGB 12.1 (L) 09/06/2019   HCT 36.5 (L) 09/06/2019   MCV 90.7 09/06/2019   PLT 448.0 (H) 09/06/2019   Lab Results  Component Value Date   NA 137 09/06/2019   K 4.7 09/06/2019   CO2 31 09/06/2019   GLUCOSE 180 (H) 09/06/2019   BUN 14 09/06/2019   CREATININE 0.62 09/06/2019   BILITOT 0.3 09/06/2019   ALKPHOS 79 09/06/2019   AST 11 09/06/2019   ALT 17 09/06/2019   PROT 7.4 09/06/2019   ALBUMIN 4.5 09/06/2019   CALCIUM 9.9 09/06/2019   ANIONGAP 10 08/21/2019   GFR 135.25 09/06/2019   Lab Results  Component Value Date   CHOL 175 08/11/2019   Lab Results  Component Value Date   HDL 30.80 (L) 08/11/2019   Lab Results  Component Value Date   LDLCALC 108 (H) 08/11/2019   Lab Results  Component Value Date   TRIG 184.0 (H) 08/11/2019   Lab Results  Component Value Date   CHOLHDL 6 08/11/2019   Lab Results  Component Value Date   HGBA1C 11.3 (H) 08/01/2019      Assessment & Plan:   Problem List Items Addressed This Visit      Digestive   Upper GI bleed   Relevant Orders   CBC with Differential/Platelet (Completed)     Endocrine   Diabetes mellitus without complication ([place]) - Primary   Relevant Medications   metFORMIN (GLUCOPHAGE) 1000 MG tablet   Other Relevant Orders   Comp Met (CMET) (Completed)   Ambulatory referral to Podiatry     Other   Splenic  infarct   Relevant Orders   CBC with Differential/Platelet (Completed)   Dizziness   Relevant Orders   Comp Met (CMET) (Completed)      Meds ordered this encounter  Medications  . metFORMIN (GLUCOPHAGE) 1000 MG tablet    Sig: Take 0.5 tablets (500 mg total) by mouth 2 (two) times daily with a meal.    Dispense:  180 tablet    Refill:  3    Order Specific Question:   Supervising Provider    Answer:   Einar Pheasant [[id number]]   Labs today and will check your blood sugar and Hgb since you feel dizzy.  He looks well.  We discussed hypoglycemia signs and symptoms and he was advised to keep his glucose tablets in various rooms in the house in his car.  He has them all at one spot in the house.  He is going  to continue with Metformin 500 mg twice daily and Lantus 15  units in the morning.   Referral to podiatry for toenail and foot care.  Patient advised that he needs to wear socks, as that will keep his feet cleaner, and  have less bacteria that can infect his toes. He needs new shoes. He reports diabetic neuropathy.  Keep the NEUROLOGY appt  As planned.   Follow-up: Return in about 1 month (around 10/06/2019).  This visit occurred during the SARS-CoV-2 public health emergency.  Safety protocols were in place, including screening questions prior to the visit, additional usage of staff PPE, and extensive cleaning of exam room while observing appropriate contact time as indicated for disinfecting solutions.    [name], NP

## 2019-09-06 NOTE — Patient Instructions (Addendum)
Labs today and will check your blood sugar and Hgb since you feel dizzy.   His body felt shakey, hot all over, could hardly move when has blood sugar fell to 46. In the hospital.  He felt hot, sweaty, shakey when his blood sugar was 64- in the hospital.   Keep your glucose  tablets in various rooms and car- keep them handy.   Stay on the Metformin 500 mg twice daily and Lantus 15  units in the morning.   Keep the NEUROLOGY appt .

## 2019-09-06 NOTE — Progress Notes (Signed)
This patient returns to my office for at risk foot care.  This patient requires this care by a professional since this patient will be at risk due to having coagulation defect, diabetes.  Patient is taking plavix.  Patient says his nails are turning black and frequently come off .  This patient is unable to cut nails himself since the patient cannot reach his nails.These nails are painful walking and wearing shoes.  This patient presents for at risk foot care today.  General Appearance  Alert, conversant and in no acute stress.  Vascular  Dorsalis pedis and posterior tibial  pulses are palpable  bilaterally.  Capillary return is within normal limits  bilaterally. Temperature is within normal limits  bilaterally.  Neurologic  Senn-Weinstein monofilament wire test absent  bilaterally. Muscle power within normal limits bilaterally.  Nails Thick disfigured discolored nails with subungual debris  from hallux to fifth toes bilaterally. No evidence of bacterial infection or drainage bilaterally.  Orthopedic  No limitations of motion  feet .  No crepitus or effusions noted.  Mild  HAV  B/L.  Skin  normotropic skin with no porokeratosis noted bilaterally.  No signs of infections or ulcers noted.     Onychomycosis  Pain in right toes  Pain in left toes  Diabetic neuropathy.  Consent was obtained for treatment procedures.   Mechanical debridement of nails 1-5  bilaterally performed with a nail nipper.  Filed with dremel without incident. Patient qualifies for diabetic shoes due to DPN and HAV.  This patient would benefit from a new pair of diabetic shoes. Patient to make an appointment with pedorthist. These shoes may cause trauma to the nails which he is unable to feel due to absent  LOPS.   Return office visit  3 months                    Told patient to return for periodic foot care and evaluation due to potential at risk complications.   Gardiner Barefoot DPM

## 2019-09-07 ENCOUNTER — Telehealth: Payer: Self-pay

## 2019-09-07 MED ORDER — METFORMIN HCL 1000 MG PO TABS: 500.0000 mg | ORAL_TABLET | Freq: Two times a day (BID) | ORAL | 3 refills | Status: DC

## 2019-09-07 NOTE — Telephone Encounter (Signed)
Call to patient to review stress test results.    Pt verbalized understanding and has no further questions at this time.    Advised pt to call for any further questions or concerns.  No further orders.   

## 2019-09-07 NOTE — Telephone Encounter (Signed)
-----   Message from [name], MD sent at 09/06/2019  6:39 PM EDT ----- No risk stress test, no evidence for ischemia.

## 2019-09-08 ENCOUNTER — Encounter: Payer: Self-pay | Admitting: Nurse Practitioner

## 2019-09-09 ENCOUNTER — Encounter: Payer: Self-pay | Admitting: Nurse Practitioner

## 2019-09-09 ENCOUNTER — Ambulatory Visit
Admission: EM | Admit: 2019-09-09 | Discharge: 2019-09-09 | Disposition: A | Discharge status: Home / Self Care | Payer: Medicare HMO | Attending: Emergency Medicine | Admitting: Emergency Medicine

## 2019-09-09 ENCOUNTER — Encounter (INDEPENDENT_AMBULATORY_CARE_PROVIDER_SITE_OTHER): Payer: Medicare HMO

## 2019-09-09 ENCOUNTER — Ambulatory Visit (INDEPENDENT_AMBULATORY_CARE_PROVIDER_SITE_OTHER): Payer: Medicare HMO | Admitting: Nurse Practitioner

## 2019-09-09 ENCOUNTER — Other Ambulatory Visit: Payer: Self-pay

## 2019-09-09 DIAGNOSIS — K047 Periapical abscess without sinus: Secondary | ICD-10-CM

## 2019-09-09 DIAGNOSIS — K0889 Other specified disorders of teeth and supporting structures: Secondary | ICD-10-CM | POA: Diagnosis not present

## 2019-09-09 MED ORDER — RELION TRUE METRIX TEST STRIPS VI STRP: ORAL_STRIP | 12 refills | Status: DC

## 2019-09-09 MED ORDER — AMOXICILLIN 875 MG PO TABS
875.0000 mg | ORAL_TABLET | Freq: Two times a day (BID) | ORAL | 0 refills | Status: AC
Start: 2019-09-09 — End: 2019-09-16

## 2019-09-09 NOTE — ED Provider Notes (Signed)
Benjamin Conley    CSN: [id number] Arrival date & time: 09/09/19  1338      History   Chief Complaint Chief Complaint  Patient presents with  . Dental Pain  . medical advice    HPI Benjamin Conley is a 54 y.o. male.   Patient presents with left upper dental pain and gum swelling.  He had some leftover cephalexin at home and started taking that last night.  He states he experienced high blood pressure with this medication (140/80).  He denies fever, chills, difficulty swallowing, shortness of breath, or other symptoms.  The history is provided by the patient.    Past Medical History:  Diagnosis Date  . Complication of anesthesia    c/o difficulty breathing after anesthesia  . COPD (chronic obstructive pulmonary disease) ([place])   . Diabetes mellitus without complication ([place])   . Hyperlipemia   . 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 Tobacco abuse     Patient Active Problem List   Diagnosis Date Noted  . Pain due to onychomycosis of toenails of both feet 09/06/2019  . Diabetic neuropathy ([place]) 09/06/2019  . Coagulation disorder ([place]) 09/06/2019  . Iron deficiency anemia due to chronic blood loss 08/29/2019  . Thrombocytosis ([place]) 08/24/2019  . Acute posthemorrhagic anemia   . Angiodysplasia of intestinal tract   . Gastrointestinal hemorrhage with melena 08/22/2019  . Melena 08/21/2019  . Postural dizziness with presyncope 08/21/2019  . Presence of arterial stent - splenic artery 08/21/2019  . Upper GI bleed 08/21/2019  . Paroxysmal supraventricular tachycardia ([place]) 08/19/2019  . Dizziness 08/19/2019  . Sinus tachycardia 08/19/2019  . Chest pain 08/19/2019  . 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  . COPD with chronic bronchitis ([place]) 08/01/2019  . Tobacco abuse 08/01/2019  . Diabetes mellitus without complication ([place])   . Hyperlipemia   . Hypertension 11/10/2013    Past Surgical History:  Procedure Laterality Date  . BACK SURGERY     lumbar  . COLONOSCOPY WITH  PROPOFOL N/A 08/24/2019   Procedure: COLONOSCOPY WITH PROPOFOL;  Surgeon: [name], MD;  Location: [facility] ENDOSCOPY;  Service: Endoscopy;  Laterality: N/A;  . ESOPHAGOGASTRODUODENOSCOPY (EGD) WITH PROPOFOL N/A 08/24/2019   Procedure: ESOPHAGOGASTRODUODENOSCOPY (EGD) WITH PROPOFOL;  Surgeon: [name], MD;  Location[facility] ENDOSCOPY;  Service: Endoscopy;  Laterality: N/A;  . STENT PLACEMENT VASCULAR ([place] HX)  07/2019   stenosis of distal splenic artery and stent placed  . VISCERAL ANGIOGRAPHY N/A 08/06/2019   Procedure: VISCERAL ANGIOGRAPHY;  Surgeon: [name], MD;  Location: [place] CV LAB;  Service: Cardiovascular;  Laterality: N/A;       Home Medications    Prior to Admission medications   Medication Sig Start Date End Date Taking? Authorizing Provider  amoxicillin (AMOXIL) 875 MG tablet Take 1 tablet (875 mg total) by mouth 2 (two) times daily for 7 days. 09/09/19 09/16/19  Sharion Balloon, NP  aspirin EC 81 MG EC tablet Take 1 tablet (81 mg total) by mouth daily. 08/07/19   [name], MD  atorvastatin (LIPITOR) 40 MG tablet Take 1 tablet (40 mg total) by mouth daily. 08/07/19   [name], MD  clopidogrel (PLAVIX) 75 MG tablet Take 2 tablets (150 mg total) by mouth daily. 08/25/19   [name], MD  insulin glargine (LANTUS) 100 UNIT/ML Solostar Pen Inject 20 Units into the skin at bedtime. 08/07/19   [name], MD  metFORMIN (GLUCOPHAGE) 1000 MG tablet Take 0.5 tablets (500 mg total) by mouth 2 (two)  times daily with a meal. 09/07/19   [name], NP  metoprolol succinate (TOPROL XL) 25 MG 24 hr tablet Take 1 tablet (25 mg total) by mouth daily. 08/20/19   [name], MD  ofloxacin (OCUFLOX) 0.3 % ophthalmic solution ofloxacin 0.3 % eye drops  INSTILL 1 DROP INTO LEFT EYE 4 TIMES DAILY    [provider]  pantoprazole (PROTONIX) 40 MG tablet Take 1 tablet (40 mg total) by mouth daily. 08/25/19 09/24/19  [name], MD    Family  History Family History  Problem Relation Age of Onset  . Diabetes Mellitus II Mother   . Diabetes Mellitus II Brother   . Hyperlipidemia Brother   . Colon cancer Maternal Aunt   . Colon cancer Maternal Uncle     Social History Social History   Tobacco Use  . Smoking status: Current Every Day Smoker    Packs/day: 2.00    Years: 48.00    Pack years: 96.00    Types: Cigarettes  . Smokeless tobacco: Never Used  Vaping Use  . Vaping Use: Never used  Substance Use Topics  . Alcohol use: No    Comment: Never been a problem  . Drug use: No     Allergies   Wasp venom, Wasp venom protein, Coffee flavor, and Onion   Review of Systems Review of Systems  Constitutional: Negative for chills and fever.  HENT: Positive for dental problem. Negative for ear pain, sore throat and trouble swallowing.   Eyes: Negative for pain and visual disturbance.  Respiratory: Negative for cough and shortness of breath.   Cardiovascular: Negative for chest pain and palpitations.  Gastrointestinal: Negative for abdominal pain and vomiting.  Genitourinary: Negative for dysuria and hematuria.  Musculoskeletal: Negative for arthralgias and back pain.  Skin: Negative for color change and rash.  Neurological: Negative for seizures and syncope.  All other systems reviewed and are negative.    Physical Exam Triage Vital Signs ED Triage Vitals [09/09/19 1341]  Enc Vitals Group     BP 124/84     Pulse Rate (!) 112     Resp 16     Temp 97.9 F (36.6 C)     Temp src      SpO2 97 %     Weight      Height      Head Circumference      Peak Flow      Pain Score      Pain Loc      Pain Edu?      Excl. in [place]?    No data found.  Updated Vital Signs BP 124/84   Pulse (!) 112   Temp 97.9 F (36.6 C)   Resp 16   SpO2 97%   Visual Acuity Right Eye Distance:   Left Eye Distance:   Bilateral Distance:    Right Eye Near:   Left Eye Near:    Bilateral Near:     Physical Exam Vitals and  nursing note reviewed.  Constitutional:      General: He is not in acute distress.    Appearance: He is well-developed.  HENT:     Head: Normocephalic and atraumatic.     Mouth/Throat:     Mouth: Mucous membranes are moist.     Dentition: Dental caries present. No gingival swelling.     Pharynx: Oropharynx is clear. Uvula midline. No uvula swelling.     Comments: Multiple missing teeth; remaining teeth in  poor repair.  Eyes:     Conjunctiva/sclera: Conjunctivae normal.  Cardiovascular:     Rate and Rhythm: Normal rate and regular rhythm.     Heart sounds: No murmur heard.   Pulmonary:     Effort: Pulmonary effort is normal. No respiratory distress.     Breath sounds: Normal breath sounds.  Abdominal:     Palpations: Abdomen is soft.     Tenderness: There is no abdominal tenderness. There is no guarding.  Musculoskeletal:     Cervical back: Neck supple.  Skin:    General: Skin is warm and dry.     Findings: No rash.  Neurological:     General: No focal deficit present.     Mental Status: He is alert and oriented to person, place, and time.     Gait: Gait normal.  Psychiatric:        Mood and Affect: Mood normal.        Behavior: Behavior normal.      UC Treatments / Results  Labs (all labs ordered are listed, but only abnormal results are displayed) Labs Reviewed - No data to display  EKG   Radiology No results found.  Procedures Procedures (including critical care time)  Medications Ordered in UC Medications - No data to display  Initial Impression / Assessment and Plan / UC Course  I have reviewed the triage vital signs and the nursing notes.  Pertinent labs & imaging results that were available during my care of the patient were reviewed by me and considered in my medical decision making (see chart for details).   Dental pain and infection.  Treating with amoxicillin.  Instructed patient to stop taking the cephalexin.  Instructed him to schedule an  appointment with a dentist as soon as possible; dental resource guide provided.  Instructed him to return here or go to the ED if he has worsening symptoms.  Patient agrees to plan of care.   Final Clinical Impressions(s) / UC Diagnoses   Final diagnoses:  Pain, dental  Dental infection     Discharge Instructions     Take the antibiotic as prescribed.    A dental resource guide is attached.  Please call to make an appointment with a dentist as soon as possible.    Return here or go to the emergency department if you develop increased pain, swelling, fever, chills, or other concerning symptoms.        ED Prescriptions    Medication Sig Dispense Auth. Provider   amoxicillin (AMOXIL) 875 MG tablet Take 1 tablet (875 mg total) by mouth 2 (two) times daily for 7 days. 14 tablet Sharion Balloon, NP     I have reviewed the PDMP during this encounter.   Sharion Balloon, NP 09/09/19 [phone]

## 2019-09-09 NOTE — Discharge Instructions (Addendum)
Take the antibiotic as prescribed.    A dental resource guide is attached.  Please call to make an appointment with a dentist as soon as possible.    Return here or go to the emergency department if you develop increased pain, swelling, fever, chills, or other concerning symptoms.

## 2019-09-09 NOTE — ED Triage Notes (Signed)
C/o dental pain and swelling that started last night. Also reports that he started taking cephalexin 500mg  TID today and reports his BP shot up 140/80 and began experiencing lower right back pain which has since resolved. Unsure if he should continue taking abx.

## 2019-09-12 NOTE — Progress Notes (Signed)
Benjamin Conley  [phone] Fax:(336) [phone]  ID: Benjamin Conley OB: 1965/10/14  MR#: [id number]  BHA#:[id number]  Patient Care Team: [name], NP as PCP - General (Nurse Practitioner) [name], MD as PCP - Cardiology (Cardiology) [name], [facility] as Pharmacist (Pharmacist) Lloyd Huger, MD as Consulting Physician (Oncology)  CHIEF COMPLAINT: Iron deficiency anemia, thrombocytosis.  INTERVAL HISTORY: Patient returns to clinic today for further evaluation, discussion of his laboratory results, and initiation of treatment with IV Venofer and IM B12.  He continues to feel well remains asymptomatic.  He does not complain of any weakness or fatigue.  He has a good appetite and denies weight loss.  He has no chest pain, shortness of breath, cough, or hemoptysis.  He denies any nausea, vomiting, constipation, or diarrhea.  He denies any melena or hematochezia.  He has no urinary complaints.  Patient offers no specific complaints today.  REVIEW OF SYSTEMS:   Review of Systems  Constitutional: Negative.  Negative for fever, malaise/fatigue and weight loss.  Respiratory: Negative.  Negative for cough, hemoptysis and shortness of breath.   Cardiovascular: Negative.  Negative for chest pain and leg swelling.  Gastrointestinal: Negative.  Negative for abdominal pain, blood in stool and melena.  Genitourinary: Negative.  Negative for hematuria.  Musculoskeletal: Negative.  Negative for back pain.  Skin: Negative.  Negative for rash.  Neurological: Negative.  Negative for dizziness, weakness and headaches.  Psychiatric/Behavioral: Negative.  The patient is not nervous/anxious.     As per HPI. Otherwise, a complete review of systems is negative.  PAST MEDICAL HISTORY: Past Medical History:  Diagnosis Date  . Complication of anesthesia    c/o difficulty breathing after anesthesia  . COPD (chronic obstructive pulmonary disease)  ([place])   . Diabetes mellitus without complication ([place])   . Hyperlipemia   . 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 Tobacco abuse     PAST SURGICAL HISTORY: Past Surgical History:  Procedure Laterality Date  . BACK SURGERY     lumbar  . COLONOSCOPY WITH PROPOFOL N/A 08/24/2019   Procedure: COLONOSCOPY WITH PROPOFOL;  Surgeon: [name], MD;  Location: [facility] ENDOSCOPY;  Service: Endoscopy;  Laterality: N/A;  . ESOPHAGOGASTRODUODENOSCOPY (EGD) WITH PROPOFOL N/A 08/24/2019   Procedure: ESOPHAGOGASTRODUODENOSCOPY (EGD) WITH PROPOFOL;  Surgeon: [name], MD;  Location: [facility] ENDOSCOPY;  Service: Endoscopy;  Laterality: N/A;  . STENT PLACEMENT VASCULAR ([place] HX)  07/2019   stenosis of distal splenic artery and stent placed  . VISCERAL ANGIOGRAPHY N/A 08/06/2019   Procedure: VISCERAL ANGIOGRAPHY;  Surgeon: [name], MD;  Location: [place] CV LAB;  Service: Cardiovascular;  Laterality: N/A;    FAMILY HISTORY: Family History  Problem Relation Age of Onset  . Diabetes Mellitus II Mother   . Diabetes Mellitus II Brother   . Hyperlipidemia Brother   . Colon cancer Maternal Aunt   . Colon cancer Maternal Uncle     ADVANCED DIRECTIVES (Y/N):  N  HEALTH MAINTENANCE: Social History   Tobacco Use  . Smoking status: Current Every Day Smoker    Packs/day: 2.00    Years: 48.00    Pack years: 96.00    Types: Cigarettes  . Smokeless tobacco: Never Used  Vaping Use  . Vaping Use: Never used  Substance Use Topics  . Alcohol use: No    Comment: Never been a problem  . Drug use: No     Colonoscopy:  PAP:  Bone density:  Lipid panel:  Allergies  Allergen Reactions  . Wasp Venom Anaphylaxis  . Wasp Venom Protein Anaphylaxis  . Coffee Flavor Nausea And Vomiting    Patient states coffee gives him nausea and stomach cramps  . Onion     Current Outpatient Medications  Medication Sig Dispense Refill  . aspirin EC 81 MG EC tablet Take 1 tablet (81 mg total) by mouth daily.  90 tablet 1  . atorvastatin (LIPITOR) 40 MG tablet Take 1 tablet (40 mg total) by mouth daily. 90 tablet 1  . clopidogrel (PLAVIX) 75 MG tablet Take 2 tablets (150 mg total) by mouth daily. 90 tablet 1  . glucose blood (RELION TRUE METRIX TEST STRIPS) test strip Use as instructed to check blood sugars twice daily. Dx E11.9 200 each 12  . insulin glargine (LANTUS) 100 UNIT/ML Solostar Pen Inject 20 Units into the skin at bedtime. 15 mL 11  . metFORMIN (GLUCOPHAGE) 1000 MG tablet Take 0.5 tablets (500 mg total) by mouth 2 (two) times daily with a meal. 180 tablet 3  . metoprolol succinate (TOPROL XL) 25 MG 24 hr tablet Take 1 tablet (25 mg total) by mouth daily. 90 tablet 3  . ofloxacin (OCUFLOX) 0.3 % ophthalmic solution ofloxacin 0.3 % eye drops  INSTILL 1 DROP INTO LEFT EYE 4 TIMES DAILY    . pantoprazole (PROTONIX) 40 MG tablet Take 1 tablet (40 mg total) by mouth daily. 30 tablet 0   No current facility-administered medications for this visit.    OBJECTIVE: Vitals:   09/17/19 1357  BP: 120/73  Pulse: (!) 101  Temp: 98.7 F (37.1 C)  SpO2: 100%     Body mass index is 30.93 kg/m.    ECOG FS:0 - Asymptomatic  General: Well-developed, well-nourished, no acute distress. Eyes: Pink conjunctiva, anicteric sclera. HEENT: Normocephalic, moist mucous membranes. Lungs: No audible wheezing or coughing. Heart: Regular rate and rhythm. Abdomen: Soft, nontender, no obvious distention. Musculoskeletal: No edema, cyanosis, or clubbing. Neuro: Alert, answering all questions appropriately. Cranial nerves grossly intact. Skin: No rashes or petechiae noted. Psych: Normal affect.  LAB RESULTS:  Lab Results  Component Value Date   NA 137 09/06/2019   K 4.7 09/06/2019   CL 99 09/06/2019   CO2 31 09/06/2019   GLUCOSE 180 (H) 09/06/2019   BUN 14 09/06/2019   CREATININE 0.62 09/06/2019   CALCIUM 9.9 09/06/2019   PROT 7.4 09/06/2019   ALBUMIN 4.5 09/06/2019   AST 11 09/06/2019   ALT 17  09/06/2019   ALKPHOS 79 09/06/2019   BILITOT 0.3 09/06/2019   GFRNONAA >60 08/21/2019   GFRAA >60 08/21/2019    Lab Results  Component Value Date   WBC 8.7 09/06/2019   NEUTROABS 4.7 09/06/2019   HGB 12.1 (L) 09/06/2019   HCT 36.5 (L) 09/06/2019   MCV 90.7 09/06/2019   PLT 448.0 (H) 09/06/2019   Lab Results  Component Value Date   IRON 35 (L) 08/27/2019   TIBC 371 08/27/2019   IRONPCTSAT 9 (L) 08/27/2019   Lab Results  Component Value Date   FERRITIN 28 08/27/2019     STUDIES: DG Chest 2 View  Result Date: 08/21/2019 CLINICAL DATA:  New onset tachycardia and leukocytosis EXAM: CHEST - 2 VIEW COMPARISON:  08/04/2019 CT FINDINGS: The heart size and mediastinal contours are within normal limits. Both lungs are clear. The visualized skeletal structures are unremarkable. IMPRESSION: No active cardiopulmonary disease. Electronically Signed   By: Inez Catalina M.D.   On: 08/21/2019 18:58   CT Angio Chest  PE W and/or Wo Contrast  Result Date: 08/21/2019 CLINICAL DATA:  Tachycardia and weakness EXAM: CT ANGIOGRAPHY CHEST WITH CONTRAST TECHNIQUE: Multidetector CT imaging of the chest was performed using the standard protocol during bolus administration of intravenous contrast. Multiplanar CT image reconstructions and MIPs were obtained to evaluate the vascular anatomy. CONTRAST:  36mL OMNIPAQUE IOHEXOL 350 MG/ML SOLN COMPARISON:  Chest x-ray from the previous day. FINDINGS: Cardiovascular: Thoracic aorta and its branches are within normal limits. No cardiac enlargement is seen. Mild coronary calcifications are noted. The pulmonary artery shows a normal branching pattern. No definitive filling defect to suggest pulmonary embolism is seen. Mediastinum/Nodes: Thoracic inlet is within normal limits. No sizable hilar or mediastinal adenopathy is noted. A few small calcified lymph nodes are noted within the left infrahilar region. The esophagus is air-filled and within normal limits. Lungs/Pleura:  The lungs are well aerated bilaterally. No focal infiltrate or sizable effusion is seen. No pneumothorax is seen. No parenchymal nodules are noted. Upper Abdomen: Scattered calcifications are noted within the spleen consistent with prior granulomatous disease. Splenic artery stent is noted. Persistent mottled enhancement of the spleen is noted consistent with the prior infarcts. The remainder of the upper abdomen appears within normal limits. Musculoskeletal: No chest wall abnormality. No acute or significant osseous findings. Review of the MIP images confirms the above findings. IMPRESSION: No evidence of pulmonary emboli. Changes of prior granulomatous disease. Patent splenic artery stent. Persistent mottled enhancement of the spleen is noted consistent with prior infarcts Electronically Signed   By: Inez Catalina M.D.   On: 08/21/2019 18:58   MR Brain Wo Contrast  Result Date: 08/20/2019 CLINICAL DATA:  Dizziness, nonspecific.  Dizziness for 4-5 days. EXAM: MRI HEAD WITHOUT CONTRAST TECHNIQUE: Multiplanar, multiecho pulse sequences of the brain and surrounding structures were obtained without intravenous contrast. COMPARISON:  None. FINDINGS: Brain: No acute infarct, hemorrhage, or mass lesion is present. Periventricular and scattered subcortical T2 hyperintensities bilaterally are moderately advanced for age. Mild atrophy is present. The ventricles are proportionate to the degree of atrophy. No significant extraaxial fluid collection is present. White matter changes continue into the brainstem. Cerebellum is unremarkable. The internal auditory canals are within normal limits. Vascular: Flow is present in the major intracranial arteries. Skull and upper cervical spine: The craniocervical junction is normal. Upper cervical spine is within normal limits. Marrow signal is unremarkable. Sinuses/Orbits: The paranasal sinuses and mastoid air cells are clear. The globes and orbits are within normal limits.  IMPRESSION: 1. No acute intracranial abnormality. 2. Periventricular and scattered subcortical T2 hyperintensities bilaterally are moderately advanced for age. The finding is nonspecific but can be seen in the setting of chronic microvascular ischemia, a demyelinating process such as multiple sclerosis, vasculitis, complicated migraine headaches, or as the sequelae of a prior infectious or inflammatory process. Electronically Signed   By: San Morelle M.D.   On: 08/20/2019 09:50   NM Myocar Multi W/Spect W/Wall Motion / EF  Result Date: 09/01/2019  Low risk, probably normal pharmacologic myocardial perfusions stress test.  There is a small in size, mild in severity, minimally reversible defect involving the mid anteroseptal and apical anterior segments most consistent with artifact but cannot rule out subtle scar and periinfarct ischemia.  The left ventricular ejection fraction is normal to mildly decreased (59% by Siemens calculation, 45% by QGS). Recommend echo correlation.  There is no significant coronary artery calcification on the attenuation correction CT.    [place] LT UPPER EXTREM LTD SOFT TISSUE NON VASCULAR  Result Date: 08/29/2019 CLINICAL DATA:  Dorsal left hand and wrist pain and swelling. Recent left hand IV which was removed approximately 1 week ago EXAM: ULTRASOUND LEFT UPPER EXTREMITY LIMITED TECHNIQUE: Ultrasound examination of the upper extremity soft tissues was performed in the area of clinical concern. COMPARISON:  None. FINDINGS: Within the dorsal aspect of the left hand and wrist in the area of patient's clinical concern, there is a superficial vein with an occlusive echogenic thrombus compatible with a superficial thrombophlebitis. No surrounding soft tissue edema. No fluid collection. No solid or cystic mass or other abnormality. No sonographic evidence of a soft tissue foreign body. IMPRESSION: Superficial thrombophlebitis of a vein in the region of the dorsal left hand and  wrist. Findings are likely secondary to recent IV catheter at this location. There is no soft tissue fluid collection or evidence of foreign body. Electronically Signed   By: Davina Poke D.O.   On: 08/29/2019 13:01    ASSESSMENT: Iron deficiency anemia, thrombocytosis. PLAN:   1. Iron deficiency anemia: Patient's most recent hemoglobin is only mildly decreased at 12.1, but his iron stores are significantly reduced. Colonoscopy and EGD in June 2021 revealed a total of 4 angiodysplastic lesions that were cauterized with argon laser.  No other significant pathology was noted.  Proceed with 510 mg IV Feraheme today.  Return to clinic in 1 week for second infusion.  Patient will then return to clinic in 3 months with repeat laboratory work, further evaluation, and consideration of additional treatment if necessary. 2.  Thrombocytosis: Secondary to iron deficiency.  IV iron as above.  JAK2 mutation is negative. 3.  B12 deficiency: Proceed with 1000 mcg intramuscular B12 today.  Patient will receive a second dose at his appointment in 3 months.  I spent a total of 30 minutes reviewing chart data, face-to-face evaluation with the patient, counseling and coordination of care as detailed above.  Patient expressed understanding and was in agreement with this plan. He also understands that He can call clinic at any time with any questions, concerns, or complaints.    Lloyd Huger, MD   09/18/2019 1:34 PM

## 2019-09-15 ENCOUNTER — Encounter: Payer: Self-pay | Admitting: Nurse Practitioner

## 2019-09-16 NOTE — Telephone Encounter (Signed)
Can you triage this.patient message?

## 2019-09-17 ENCOUNTER — Other Ambulatory Visit: Payer: Self-pay

## 2019-09-17 ENCOUNTER — Inpatient Hospital Stay: Payer: Medicare HMO | Attending: Oncology | Admitting: Oncology

## 2019-09-17 ENCOUNTER — Telehealth: Payer: Medicare HMO | Admitting: Oncology

## 2019-09-17 ENCOUNTER — Inpatient Hospital Stay: Payer: Medicare HMO

## 2019-09-17 ENCOUNTER — Encounter: Payer: Self-pay | Admitting: Oncology

## 2019-09-17 VITALS — BP 120/73 | HR 101 | Temp 98.7°F | Wt 180.2 lb

## 2019-09-17 VITALS — BP 100/64 | HR 87 | Temp 96.1°F

## 2019-09-17 DIAGNOSIS — D500 Iron deficiency anemia secondary to blood loss (chronic): Secondary | ICD-10-CM

## 2019-09-17 DIAGNOSIS — E538 Deficiency of other specified B group vitamins: Secondary | ICD-10-CM | POA: Diagnosis not present

## 2019-09-17 DIAGNOSIS — Z79899 Other long term (current) drug therapy: Secondary | ICD-10-CM | POA: Insufficient documentation

## 2019-09-17 DIAGNOSIS — D509 Iron deficiency anemia, unspecified: Secondary | ICD-10-CM | POA: Insufficient documentation

## 2019-09-17 DIAGNOSIS — D473 Essential (hemorrhagic) thrombocythemia: Secondary | ICD-10-CM | POA: Diagnosis not present

## 2019-09-17 MED ORDER — CYANOCOBALAMIN 1000 MCG/ML IJ SOLN
1000.0000 ug | Freq: Once | INTRAMUSCULAR | Status: AC
  Administered 2019-09-17: 1000 ug via INTRAMUSCULAR
  Filled 2019-09-17: qty 1

## 2019-09-17 MED ORDER — SODIUM CHLORIDE 0.9 % IV SOLN
Freq: Once | INTRAVENOUS | Status: AC
  Filled 2019-09-17: qty 250

## 2019-09-17 MED ORDER — SODIUM CHLORIDE 0.9 % IV SOLN
510.0000 mg | Freq: Once | INTRAVENOUS | Status: AC
  Administered 2019-09-17: 510 mg via INTRAVENOUS
  Filled 2019-09-17: qty 510

## 2019-09-17 NOTE — Progress Notes (Signed)
Pt tolerated infusion well. No s/s of distress or reaction noted. Pt educated to report to ER in the event of an emergency or call clinic with any questions, pt verbalizes understanding. Pt and VS stable at discharge.

## 2019-09-21 ENCOUNTER — Encounter: Payer: Self-pay | Admitting: Nurse Practitioner

## 2019-09-21 ENCOUNTER — Telehealth: Payer: Self-pay | Admitting: Cardiology

## 2019-09-21 NOTE — Telephone Encounter (Signed)
His CARDIOLOGIST has to make blood thinner recommendations.

## 2019-09-21 NOTE — Telephone Encounter (Signed)
   [facility] Pre-operative Risk Assessment    HEARTCARE STAFF: - Please ensure there is not already an duplicate clearance open for this procedure. - Under Visit Info/Reason for Call, type in Other and utilize the format Clearance MM/DD/YY or Clearance TBD. Do not use dashes or single digits. - If request is for dental extraction, please clarify the # of teeth to be extracted.  Request for surgical clearance:  What type of surgery is being performed? 7 extractions   When is this surgery scheduled? TBD  What type of clearance is required (medical clearance vs. Pharmacy clearance to hold med vs. Both)? both  Are there any medications that need to be held prior to surgery and how long? Plavix instructions    Practice name and name of physician performing surgery? [facility] - Dr Marc Morgans  What is the office phone number? [phone]   7.   What is the office fax number? [phone]  8.   Anesthesia type (None, local, MAC, general) ? None   Ace Gins 09/21/2019, 12:41 PM  _________________________________________________________________

## 2019-09-21 NOTE — Telephone Encounter (Signed)
   Primary Cardiologist: [name], MD  Chart reviewed as part of pre-operative protocol coverage. Appears patient established care with Dr. Garen Lah 08/2018 at which time he had chest pain/DOE complaints. He underwent a stress test 09/01/19 which without ischemia, though EF reportedly 45%. Echo is pending for 09/28/19. Will await these results prior to providing further preop assessment.  In regards to his plavix, it appears this was started following a distal splenic artery stent for a splenic infarct, managed by vascular surgery. Will defer recommendations for holding plavix to his vascular surgeon, Dr. [name].   Abigail Butts, PA-C 09/21/2019, 1:00 PM

## 2019-09-22 ENCOUNTER — Telehealth: Payer: Self-pay | Admitting: Nurse Practitioner

## 2019-09-22 ENCOUNTER — Encounter: Payer: Self-pay | Admitting: Nurse Practitioner

## 2019-09-22 ENCOUNTER — Ambulatory Visit: Payer: Medicare HMO | Admitting: Orthotics

## 2019-09-22 ENCOUNTER — Other Ambulatory Visit: Payer: Self-pay

## 2019-09-22 DIAGNOSIS — H9313 Tinnitus, bilateral: Secondary | ICD-10-CM | POA: Diagnosis not present

## 2019-09-22 DIAGNOSIS — B351 Tinea unguium: Secondary | ICD-10-CM

## 2019-09-22 DIAGNOSIS — M79674 Pain in right toe(s): Secondary | ICD-10-CM

## 2019-09-22 DIAGNOSIS — G479 Sleep disorder, unspecified: Secondary | ICD-10-CM | POA: Diagnosis not present

## 2019-09-22 DIAGNOSIS — R9089 Other abnormal findings on diagnostic imaging of central nervous system: Secondary | ICD-10-CM | POA: Insufficient documentation

## 2019-09-22 DIAGNOSIS — R42 Dizziness and giddiness: Secondary | ICD-10-CM | POA: Diagnosis not present

## 2019-09-22 DIAGNOSIS — E1142 Type 2 diabetes mellitus with diabetic polyneuropathy: Secondary | ICD-10-CM

## 2019-09-22 NOTE — Telephone Encounter (Signed)
I called Mr. Nguyenthi and spoke to him about his feeling smothered at night.  He says it also happens some during the daytime and has been an intermittent problem in the past and he was told it was probably  COPD and even asthma.  He has noticed that it occurs more at night, but he has had it during the daytime to when he would walk into the laundromat or place where there is a lot of chemicals.  He checked his pulse ox today is 98%, heart rate is his baseline 100, he is breathing well and has no shortness of breath, wheezing, cough, chest pain dizziness,or  lightheadedness.  He saw Dr. Melrose Nakayama in Neurology today and has plans to have a carotid ultrasound done.  He has not had sleep apnea studies.He feels well.     Plan: Office visit with me tomorrow at 1130.

## 2019-09-22 NOTE — Telephone Encounter (Signed)
Done

## 2019-09-22 NOTE — Progress Notes (Signed)
Being seen by NP and couldn't tell me who was the managing physician; niether could the dr. Gabriel Carina.

## 2019-09-22 NOTE — Telephone Encounter (Signed)
Benjamin Conley called from [facility] and stated that pt need diabetic shoes and [name] can not sign for his shoes has to have her supervisor to sign off on them. Please contact pt to let him know.

## 2019-09-23 ENCOUNTER — Emergency Department
Admission: EM | Admit: 2019-09-23 | Discharge: 2019-09-23 | Disposition: A | Discharge status: Home / Self Care | Payer: Medicare HMO | Attending: Emergency Medicine | Admitting: Emergency Medicine

## 2019-09-23 ENCOUNTER — Emergency Department: Payer: Medicare HMO

## 2019-09-23 ENCOUNTER — Ambulatory Visit: Payer: Medicare HMO | Admitting: Nurse Practitioner

## 2019-09-23 ENCOUNTER — Other Ambulatory Visit: Payer: Self-pay

## 2019-09-23 ENCOUNTER — Inpatient Hospital Stay: Payer: Medicare HMO

## 2019-09-23 DIAGNOSIS — E1140 Type 2 diabetes mellitus with diabetic neuropathy, unspecified: Secondary | ICD-10-CM | POA: Insufficient documentation

## 2019-09-23 DIAGNOSIS — R9082 White matter disease, unspecified: Secondary | ICD-10-CM | POA: Diagnosis not present

## 2019-09-23 DIAGNOSIS — M25512 Pain in left shoulder: Secondary | ICD-10-CM | POA: Insufficient documentation

## 2019-09-23 DIAGNOSIS — Z79899 Other long term (current) drug therapy: Secondary | ICD-10-CM | POA: Diagnosis not present

## 2019-09-23 DIAGNOSIS — Z794 Long term (current) use of insulin: Secondary | ICD-10-CM | POA: Diagnosis not present

## 2019-09-23 DIAGNOSIS — I10 Essential (primary) hypertension: Secondary | ICD-10-CM | POA: Diagnosis not present

## 2019-09-23 DIAGNOSIS — R21 Rash and other nonspecific skin eruption: Secondary | ICD-10-CM | POA: Diagnosis not present

## 2019-09-23 DIAGNOSIS — F17210 Nicotine dependence, cigarettes, uncomplicated: Secondary | ICD-10-CM | POA: Insufficient documentation

## 2019-09-23 DIAGNOSIS — Z7982 Long term (current) use of aspirin: Secondary | ICD-10-CM | POA: Insufficient documentation

## 2019-09-23 DIAGNOSIS — R0602 Shortness of breath: Secondary | ICD-10-CM | POA: Diagnosis not present

## 2019-09-23 DIAGNOSIS — Z20822 Contact with and (suspected) exposure to covid-19: Secondary | ICD-10-CM | POA: Insufficient documentation

## 2019-09-23 DIAGNOSIS — R519 Headache, unspecified: Secondary | ICD-10-CM | POA: Insufficient documentation

## 2019-09-23 DIAGNOSIS — J441 Chronic obstructive pulmonary disease with (acute) exacerbation: Secondary | ICD-10-CM | POA: Diagnosis not present

## 2019-09-23 LAB — TROPONIN I (HIGH SENSITIVITY)
Troponin I (High Sensitivity): 5 ng/L (ref <18)
Troponin I (High Sensitivity): 5 ng/L (ref <18)

## 2019-09-23 LAB — CBC
HCT: 34.6 % — ABNORMAL LOW (ref 39.0–52.0)
Hemoglobin: 11.4 g/dL — ABNORMAL LOW (ref 13.0–17.0)
MCH: 29.3 pg (ref 26.0–34.0)
MCHC: 32.9 g/dL (ref 30.0–36.0)
MCV: 88.9 fL (ref 80.0–100.0)
Platelets: 756 10*3/uL — ABNORMAL HIGH (ref 150–400)
RBC: 3.89 MIL/uL — ABNORMAL LOW (ref 4.22–5.81)
RDW: 14.6 % (ref 11.5–15.5)
WBC: 9.7 10*3/uL (ref 4.0–10.5)
nRBC: 0 % (ref 0.0–0.2)

## 2019-09-23 LAB — BRAIN NATRIURETIC PEPTIDE: B Natriuretic Peptide: 35.9 pg/mL (ref 0.0–100.0)

## 2019-09-23 LAB — BASIC METABOLIC PANEL
Anion gap: 12 (ref 5–15)
BUN: 8 mg/dL (ref 6–20)
CO2: 25 mmol/L (ref 22–32)
Calcium: 9.1 mg/dL (ref 8.9–10.3)
Chloride: 103 mmol/L (ref 98–111)
Creatinine, Ser: 0.64 mg/dL (ref 0.61–1.24)
GFR calc Af Amer: >60 mL/min (ref >60)
GFR calc non Af Amer: >60 mL/min (ref >60)
Glucose, Bld: 126 mg/dL — ABNORMAL HIGH (ref 70–99)
Potassium: 3.8 mmol/L (ref 3.5–5.1)
Sodium: 140 mmol/L (ref 135–145)

## 2019-09-23 LAB — SARS CORONAVIRUS 2 BY RT PCR (HOSPITAL ORDER, PERFORMED IN ~~LOC~~ HOSPITAL LAB): SARS Coronavirus 2: NEGATIVE

## 2019-09-23 MED ORDER — IOHEXOL 350 MG/ML SOLN
75.0000 mL | Freq: Once | INTRAVENOUS | Status: AC | PRN
  Administered 2019-09-23: 75 mL via INTRAVENOUS

## 2019-09-23 MED ORDER — ACETAMINOPHEN 500 MG PO TABS
1000.0000 mg | ORAL_TABLET | Freq: Once | ORAL | Status: AC
  Administered 2019-09-23: 1000 mg via ORAL
  Filled 2019-09-23: qty 2

## 2019-09-23 MED ORDER — IPRATROPIUM-ALBUTEROL 0.5-2.5 (3) MG/3ML IN SOLN
3.0000 mL | Freq: Once | RESPIRATORY_TRACT | Status: AC
  Administered 2019-09-23: 3 mL via RESPIRATORY_TRACT
  Filled 2019-09-23: qty 9

## 2019-09-23 MED ORDER — IPRATROPIUM-ALBUTEROL 0.5-2.5 (3) MG/3ML IN SOLN
3.0000 mL | Freq: Once | RESPIRATORY_TRACT | Status: AC
  Administered 2019-09-23: 3 mL via RESPIRATORY_TRACT

## 2019-09-23 MED ORDER — SODIUM CHLORIDE 0.9% FLUSH: 3.0000 mL | Freq: Once | INTRAVENOUS | Status: DC

## 2019-09-23 MED ORDER — PREDNISONE 20 MG PO TABS: 40.0000 mg | ORAL_TABLET | Freq: Every day | ORAL | 0 refills | Status: AC

## 2019-09-23 MED ORDER — METHYLPREDNISOLONE SODIUM SUCC 125 MG IJ SOLR
125.0000 mg | Freq: Once | INTRAMUSCULAR | Status: AC
  Administered 2019-09-23: 125 mg via INTRAVENOUS
  Filled 2019-09-23: qty 2

## 2019-09-23 NOTE — ED Notes (Signed)
Ambulatory oxygen sat 98% with no respiratory difficulty. EDP made aware. Juice provided to pt.

## 2019-09-23 NOTE — ED Provider Notes (Signed)
[facility] Emergency Department Provider Note  ____________________________________________   First MD Initiated Contact with Patient 09/23/19 [phone]     (approximate)  I have reviewed the triage vital signs and the nursing notes.   HISTORY  Chief Complaint Shortness of Breath    HPI Benjamin Conley is a 54 y.o. male with COPD, hyperlipidemia, diabetes who comes in with shortness of breath.  Patient reports 4 days of shortness of breath.  Patient states that the shortness of breath is mostly at nighttime, moderate, nothing makes it better.  He reports a history of COPD but states that this feels worse than his normal.  He reports a chronic cough, no fevers, no Covid contacts.  Reports some pain underneath his left shoulder blade that is new.  He reports having a recent splenic clot that he is on Plavix for.  Denies any leg swelling or other risk factors for PE.  He also reports having a headache for the past few days but seems to be more new than normal.  However currently the pain is only mild in nature.          Past Medical History:  Diagnosis Date  . Complication of anesthesia    c/o difficulty breathing after anesthesia  . COPD (chronic obstructive pulmonary disease) ([place])   . Diabetes mellitus without complication ([place])   . Hyperlipemia   . 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 Tobacco abuse     Patient Active Problem List   Diagnosis Date Noted  . Pain due to onychomycosis of toenails of both feet 09/06/2019  . Diabetic neuropathy ([place]) 09/06/2019  . Coagulation disorder ([place]) 09/06/2019  . Iron deficiency anemia due to chronic blood loss 08/29/2019  . Thrombocytosis ([place]) 08/24/2019  . Acute posthemorrhagic anemia   . Angiodysplasia of intestinal tract   . Gastrointestinal hemorrhage with melena 08/22/2019  . Melena 08/21/2019  . Postural dizziness with presyncope 08/21/2019  . Presence of arterial stent - splenic artery 08/21/2019  . Upper  GI bleed 08/21/2019  . Paroxysmal supraventricular tachycardia ([place]) 08/19/2019  . Dizziness 08/19/2019  . Sinus tachycardia 08/19/2019  . Chest pain 08/19/2019  . 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  . COPD with chronic bronchitis ([place]) 08/01/2019  . Tobacco abuse 08/01/2019  . Diabetes mellitus without complication ([place])   . Hyperlipemia   . Impingement syndrome of shoulder region 03/30/2018  . Hypertension 11/10/2013    Past Surgical History:  Procedure Laterality Date  . BACK SURGERY     lumbar  . COLONOSCOPY WITH PROPOFOL N/A 08/24/2019   Procedure: COLONOSCOPY WITH PROPOFOL;  Surgeon: [name], MD;  Location: [facility] ENDOSCOPY;  Service: Endoscopy;  Laterality: N/A;  . ESOPHAGOGASTRODUODENOSCOPY (EGD) WITH PROPOFOL N/A 08/24/2019   Procedure: ESOPHAGOGASTRODUODENOSCOPY (EGD) WITH PROPOFOL;  Surgeon: [name], MD;  Location: [facility] ENDOSCOPY;  Service: Endoscopy;  Laterality: N/A;  . STENT PLACEMENT VASCULAR ([place] [facility])  07/2019   stenosis of distal splenic artery and stent placed  . VISCERAL ANGIOGRAPHY N/A 08/06/2019   Procedure: VISCERAL ANGIOGRAPHY;  Surgeon: [name], MD;  Location: [facility] CV LAB;  Service: Cardiovascular;  Laterality: N/A;    Prior to Admission medications   Medication Sig Start Date End Date Taking? Authorizing Provider  aspirin EC 81 MG EC tablet Take 1 tablet (81 mg total) by mouth daily. 08/07/19   [name], MD  atorvastatin (LIPITOR) 40 MG tablet Take 1 tablet (40 mg total) by mouth daily. 08/07/19   [name], MD  clopidogrel (  PLAVIX) 75 MG tablet Take 2 tablets (150 mg total) by mouth daily. 08/25/19   [name], MD  glucose blood (RELION TRUE METRIX TEST STRIPS) test strip Use as instructed to check blood sugars twice daily. Dx E11.9 09/09/19   [name], NP  insulin glargine (LANTUS) 100 UNIT/ML Solostar Pen Inject 20 Units into the skin at bedtime. 08/07/19   [name], MD  metFORMIN (GLUCOPHAGE) 1000 MG tablet  Take 0.5 tablets (500 mg total) by mouth 2 (two) times daily with a meal. 09/07/19   [name], NP  metoprolol succinate (TOPROL XL) 25 MG 24 hr tablet Take 1 tablet (25 mg total) by mouth daily. 08/20/19   [name], MD  ofloxacin (OCUFLOX) 0.3 % ophthalmic solution ofloxacin 0.3 % eye drops  INSTILL 1 DROP INTO LEFT EYE 4 TIMES DAILY    [provider]  pantoprazole (PROTONIX) 40 MG tablet Take 1 tablet (40 mg total) by mouth daily. 08/25/19 09/24/19  [name], MD    Allergies Wasp venom, Wasp venom protein, Coffee flavor, and Onion  Family History  Problem Relation Age of Onset  . Diabetes Mellitus II Mother   . Diabetes Mellitus II Brother   . Hyperlipidemia Brother   . Colon cancer Maternal Aunt   . Colon cancer Maternal Uncle     Social History Social History   Tobacco Use  . Smoking status: Current Every Day Smoker    Packs/day: 2.00    Years: 48.00    Pack years: 96.00    Types: Cigarettes  . Smokeless tobacco: Never Used  Vaping Use  . Vaping Use: Never used  Substance Use Topics  . Alcohol use: No    Comment: Never been a problem  . Drug use: No      Review of Systems Constitutional: No fever/chills Eyes: No visual changes. ENT: No sore throat. Cardiovascular: No chest pain, Respiratory: Positive for SOB Gastrointestinal: No abdominal pain.  No nausea, no vomiting.  No diarrhea.  No constipation. Genitourinary: Negative for dysuria. Musculoskeletal: Negative for back pain.  Left shoulder blade pain Skin: Negative for rash. Neurological: Negative for headaches, focal weakness or numbness. All other ROS negative ____________________________________________   PHYSICAL EXAM:  VITAL SIGNS: ED Triage Vitals [09/23/19 0058]  Enc Vitals Group     BP (!) 165/95     Pulse Rate (!) 105     Resp 18     Temp (!) 97.5 F (36.4 C)     Temp Source Oral     SpO2 100 %     Weight 179 lb (81.2 kg)     Height 5\' 4"  (1.626 m)      Head Circumference      Peak Flow      Pain Score 1     Pain Loc      Pain Edu?      Excl. in [place]?     Constitutional: Alert and oriented. Well appearing and in no acute distress. Eyes: Conjunctivae are normal. EOMI. Head: Atraumatic. Nose: No congestion/rhinnorhea. Mouth/Throat: Mucous membranes are moist.   Neck: No stridor. Trachea Midline. FROM Cardiovascular: Tachycardic, regular rhythm. Grossly normal heart sounds.  Good peripheral circulation. Respiratory: Clear lungs, no increased work of breathing Gastrointestinal: Soft and nontender. No distention. No abdominal bruits.  Musculoskeletal: No lower extremity tenderness nor edema.  No joint effusions. Neurologic:  Normal speech and language. No gross focal neurologic deficits are appreciated.  Skin:  Skin is warm, dry  and intact. No rash noted. Psychiatric: Mood and affect are normal. Speech and behavior are normal. GU: Deferred   ____________________________________________   LABS (all labs ordered are listed, but only abnormal results are displayed)  Labs Reviewed  BASIC METABOLIC PANEL - Abnormal; Notable for the following components:      Result Value   Glucose, Bld 126 (*)    All other components within normal limits  CBC - Abnormal; Notable for the following components:   RBC 3.89 (*)    Hemoglobin 11.4 (*)    HCT 34.6 (*)    Platelets 756 (*)    All other components within normal limits  TROPONIN I (HIGH SENSITIVITY)  TROPONIN I (HIGH SENSITIVITY)   ____________________________________________   ED ECG REPORT I, Vanessa Almira, the attending physician, personally viewed and interpreted this ECG.  Sinus tachycardia rate of 109, no ST elevation, T wave inversion lead III, normal intervals ____________________________________________  RADIOLOGY Robert Bellow, personally viewed and evaluated these images (plain radiographs) as part of my medical decision making, as well as reviewing the written report by  the radiologist.  ED MD interpretation: No pneumonia  Official radiology report(s): DG Chest 2 View  Result Date: 09/23/2019 CLINICAL DATA:  54 year old male with shortness of breath. EXAM: CHEST - 2 VIEW COMPARISON:  Chest radiograph dated 08/20/2019. FINDINGS: The heart size and mediastinal contours are within normal limits. Both lungs are clear. The visualized skeletal structures are unremarkable. IMPRESSION: No active cardiopulmonary disease. Electronically Signed   By: Anner Crete M.D.   On: 09/23/2019 01:25    ____________________________________________   PROCEDURES  Procedure(s) performed (including Critical Care):  Procedures   ____________________________________________   INITIAL IMPRESSION / ASSESSMENT AND PLAN / ED COURSE   Noa Trashawn Oquendo was evaluated in Emergency Department on 09/23/2019 for the symptoms described in the history of present illness. He was evaluated in the context of the global COVID-19 pandemic, which necessitated consideration that the patient might be at risk for infection with the SARS-CoV-2 virus that causes COVID-19. Institutional protocols and algorithms that pertain to the evaluation of patients at risk for COVID-19 are in a state of rapid change based on information released by regulatory bodies including the CDC and federal and state organizations. These policies and algorithms were followed during the patient's care in the ED.     Pt presents with SOB.  Patient has a history of COPD and suspect is most likely related to it but he does not have any significant wheezing and is pretachycardic when up to the 130s with breathing.  Will get CT PE given he does have a history of recent splenic clot just make sure is no evidence of pulmonary embolism.  Will get CT head as well given he reports some new headaches in case we need to anticoagulate patient make sure no mass.    PNA-will get xray to evaluation Anemia-CBC to evaluate ACS- will get  trops Arrhythmia-Will get EKG and keep on monitor.  COVID- will get testing per algorithm. PE-lower suspicion given no risk factors and other cause more likely  Labs are notable for normal white count although his platelets are significantly elevated at 756.  Patient had prior platelet elevation will have patient follow this up with her primary care doctor  Hemoglobin is around baseline at 11.4  Cardiac markers negative  Chest x-ray negative for pneumonia  CT PE negative  CT head negative  Reevaluated patient and breathing is much better.  Patient is  a little tachycardic but he is not taking his metoprolol for 2 days he reports.  He states that he normally has some elevated heart rates.  We will have patient ambulate around the room but he feels comfortable going home.  We will give a course of prednisone.  Patient has follow-up with his primary care doctor later this afternoon.  Discussed with patient that he should discuss with the primary care doctor about the possibility of sleep apnea given some these episodes seem to be occurring at nighttime   ____________________________________________   FINAL CLINICAL IMPRESSION(S) / ED DIAGNOSES   Final diagnoses:  COPD exacerbation ([place])     MEDICATIONS GIVEN DURING THIS VISIT:  Medications  sodium chloride flush (NS) 0.9 % injection 3 mL (has no administration in time range)     ED Discharge Orders    None       Note:  This document was prepared using Dragon voice recognition software and may include unintentional dictation errors.   Vanessa Miller, MD 09/23/19 [phone]

## 2019-09-23 NOTE — ED Triage Notes (Signed)
Pt to the er for sob and chest pain x 3 days. Pt has a hx of copd and his doctors tell him he is heading for a stroke. Pt smokes a pack and a half a day. Pt is not on oxygen at home. Pt denies fever. Pt reports headaches as well.

## 2019-09-23 NOTE — Discharge Instructions (Signed)
Your CT scans were negative.  I suspect this could be from your COPD and will give you a short course of steroids.  However he should talk to your primary care doctor because it could be related to your to sleep apnea you may need further work-up for this.  To note your platelets were elevated at 756.  You should discuss this with your primary care doctor.

## 2019-09-23 NOTE — ED Notes (Signed)
Pt reports clot in spleen last month. Pt currently taking plavix and aspirin.   Pt reports feeling SOB after he falls asleep, not position dependant. Pt reports no swelling to legs.  Pt with headache for 4 days

## 2019-09-23 NOTE — Telephone Encounter (Signed)
Dr Nicki Reaper is not in the office. Will have her sign when she returns.

## 2019-09-23 NOTE — ED Notes (Signed)
Pt reports he has intermittently had headaches that affected his vision temporarily  Pt reports intermittent sharp back pain

## 2019-09-23 NOTE — Progress Notes (Signed)
Pt was seen in ER this morning for what looks like COPD Exacerbation. Platelet count was 756 today compared to 448 on 09/06/19. He has reached out to his PCP. Pt states he was feeling extremely shakey after ER discharged him but according to printed Discharge summery that he has in hand he recieved 3 Duonebs and Solu-medrol. Pt states the shakey feeling has improved, but not completely resolved. Dr. Tasia Catchings aware. Per Dr. Tasia Catchings reschedule pt for next week, no need for extra labs. Pt rescheduled and notified of appt. Pt verbalizes understanding and agrees with plan.  Pt stable at discharge.

## 2019-09-24 ENCOUNTER — Encounter: Payer: Self-pay | Admitting: Nurse Practitioner

## 2019-09-27 ENCOUNTER — Encounter: Payer: Self-pay | Admitting: Nurse Practitioner

## 2019-09-27 ENCOUNTER — Inpatient Hospital Stay: Payer: Medicare HMO

## 2019-09-27 ENCOUNTER — Telehealth (INDEPENDENT_AMBULATORY_CARE_PROVIDER_SITE_OTHER): Payer: Medicare HMO | Admitting: Nurse Practitioner

## 2019-09-27 ENCOUNTER — Other Ambulatory Visit: Payer: Self-pay

## 2019-09-27 VITALS — BP 146/90 | Temp 97.9°F | Ht 64.0 in | Wt 180.0 lb

## 2019-09-27 VITALS — BP 119/71 | HR 100 | Temp 97.2°F | Resp 18

## 2019-09-27 DIAGNOSIS — D75839 Thrombocytosis, unspecified: Secondary | ICD-10-CM

## 2019-09-27 DIAGNOSIS — R42 Dizziness and giddiness: Secondary | ICD-10-CM

## 2019-09-27 DIAGNOSIS — D500 Iron deficiency anemia secondary to blood loss (chronic): Secondary | ICD-10-CM | POA: Diagnosis not present

## 2019-09-27 DIAGNOSIS — J441 Chronic obstructive pulmonary disease with (acute) exacerbation: Secondary | ICD-10-CM | POA: Diagnosis not present

## 2019-09-27 DIAGNOSIS — Z79899 Other long term (current) drug therapy: Secondary | ICD-10-CM | POA: Diagnosis not present

## 2019-09-27 DIAGNOSIS — E119 Type 2 diabetes mellitus without complications: Secondary | ICD-10-CM

## 2019-09-27 DIAGNOSIS — E538 Deficiency of other specified B group vitamins: Secondary | ICD-10-CM | POA: Diagnosis not present

## 2019-09-27 DIAGNOSIS — H9319 Tinnitus, unspecified ear: Secondary | ICD-10-CM

## 2019-09-27 DIAGNOSIS — D473 Essential (hemorrhagic) thrombocythemia: Secondary | ICD-10-CM | POA: Diagnosis not present

## 2019-09-27 DIAGNOSIS — D509 Iron deficiency anemia, unspecified: Secondary | ICD-10-CM | POA: Diagnosis not present

## 2019-09-27 MED ORDER — SODIUM CHLORIDE 0.9 % IV SOLN
Freq: Once | INTRAVENOUS | Status: AC
  Filled 2019-09-27: qty 250

## 2019-09-27 MED ORDER — INSULIN GLARGINE 100 UNIT/ML SOLOSTAR PEN: 20.0000 [IU] | PEN_INJECTOR | Freq: Every day | SUBCUTANEOUS | 11 refills | Status: DC

## 2019-09-27 MED ORDER — ATORVASTATIN CALCIUM 40 MG PO TABS: 40.0000 mg | ORAL_TABLET | Freq: Every day | ORAL | 1 refills | Status: DC

## 2019-09-27 MED ORDER — RELION TRUE METRIX TEST STRIPS VI STRP: ORAL_STRIP | 12 refills | Status: DC

## 2019-09-27 MED ORDER — SODIUM CHLORIDE 0.9 % IV SOLN
510.0000 mg | Freq: Once | INTRAVENOUS | Status: AC
  Administered 2019-09-27: 510 mg via INTRAVENOUS
  Filled 2019-09-27: qty 510

## 2019-09-27 NOTE — Patient Instructions (Signed)
Please continue with the antibiotic and the prednisone taper the emergency room provider.  It sounds like your COPD and lung problem is getting much better.  I have placed a pulmonary referral in to address your COPD, heavy tobacco use, and to get testing to rule out sleep apnea.  You should receive a call from their office to schedule an appointment.  You have ringing in your ears, persistent dizziness, and I have placed referral into ears nose and throat specialist Dr. Mali McQueen.  You should receive a call from their office to schedule an appointment.  I refilled your usual medications.  Please see me in the office second week in August. .

## 2019-09-27 NOTE — Telephone Encounter (Signed)
This should be in your quick sign folder to sign; I can send off once signed

## 2019-09-27 NOTE — Progress Notes (Signed)
Virtual Visit via Video Note  I connected with@ on 09/27/19 at  2:00 PM EDT by a video enabled telemedicine application and verified that I am speaking with the correct person using two identifiers. This visit type was conducted due to national recommendations for restrictions regarding the COVID-19 Pandemic (e.g. social distancing).  This format is felt to be most appropriate for this patient at this time.   I discussed the limitations of evaluation and management by telemedicine and the availability of in person appointments. The patient expressed understanding and agreed to proceed.  I connected with "pt Name" today at by telephone and verified that I am speaking with the correct person using two identifiers.  Location patient: home  Location provider: work  Persons participating in the virtual visit: patient, provider    I discussed the limitations, risks, security and privacy concerns of performing an evaluation and management service by telephone and the availability of in person appointments. I also discussed with the patient that there may be a patient responsible charge related to this service. The patient expressed understanding and agreed to proceed.    History of Present Illness: This 54 year old male with complex medical history went to the emergency room went to the ED on 09/23/2019 for COPD exacerbation.  He reported a 4-day history of shortness of breath mostly at nighttime but also during the day.  He felt like he could not get his air out.  He also reported pain under the left shoulder blade.  He had a recent splenic clot is on Plavix.  He had no leg swelling or calf pain.  He does have intermittent headache and chronic intermittent dizziness.  Blood pressure was 165/95 with pulse 105 temp 97.5 in the ED.  He had not taken his metoprolol for a couple days.  He was well-appearing with clear lung fields.  No abdominal pain.   09/23/19:  Laboratory studies showed a glucose of 126,  hemoglobin 11.4 platelets 756 and he has been followed by hematology for iron deficiency anemia and thrombocytosis.  The EKG showed sinus tach without any acute changes.  Cardiac markers were negative.  Chest x-ray negative for pneumonia, CT PE negative, CT head negative.  ST without ST elevation, T wave inversion and normal intervals.    Was treated with for COPD flare with  Prednisone 40 mg daily for 5 days.  He is breathing better now.  He still has a little cough, no fevers or chills.  No shortness of breath or wheezing.  He feels like he is getting air in and out now.  He still smokes 1.5 packs/day rolls his own cigarettes.  What concerns him today is that he continues to have ringing in his ear.  He has really bad noise sensitivity.  This has been ongoing since December 2020.  He has not seen ENT.  Type 2 diabetes Diabetes mellitus: On Lantus now 20 units at bedtime.  He has been checking his blood sugars twice a day and he eats 10- 12 hours apart: 7/12: FBS 94 and pp supper 138 7/13: FBS 120 and EP 21 7/14: FBS 115 and PP 128 7/15: FBS 223 and PP 214 7/16: FBS 183 and PP 260 7/17: FBS 245, PP 323 7/18: FBS 114 and PP 313  Anemia/elevated platelets: He is followed by hematology and is seeing Dr. Grayland Ormond.  He is receiving IV iron transfusion.    I have reviewed past medical, surgical, medication, allergy, social and family histories today and updated them  in Epic where appropriate.   Past Medical History:  Diagnosis Date  . Complication of anesthesia    c/o difficulty breathing after anesthesia  . COPD (chronic obstructive pulmonary disease) ([place])   . Diabetes mellitus without complication ([place])   . Hyperlipemia   . 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 Tobacco abuse      Social History   Socioeconomic History  . Marital status: Single    Spouse name: Not on file  . Number of children: Not on file  . Years of education: Not on file  . Highest education level: Not on file   Occupational History  . Not on file  Tobacco Use  . Smoking status: Current Every Day Smoker    Packs/day: 2.00    Years: 48.00    Pack years: 96.00    Types: Cigarettes  . Smokeless tobacco: Never Used  Vaping Use  . Vaping Use: Never used  Substance and Sexual Activity  . Alcohol use: No    Comment: Never been a problem  . Drug use: No  . Sexual activity: Yes  Other Topics Concern  . Not on file  Social History Narrative   Single, lives alone, unemployed.   Social Determinants of Health   Financial Resource Strain:   . Difficulty of Paying Living Expenses:   Food Insecurity:   . Worried About Charity fundraiser in the Last Year:   . Arboriculturist in the Last Year:   Transportation Needs:   . Film/video editor (Medical):   [name] Lack of Transportation (Non-Medical):   Physical Activity:   . Days of Exercise per Week:   . Minutes of Exercise per Session:   Stress:   . Feeling of Stress :   Social Connections:   . Frequency of Communication with Friends and Family:   . Frequency of Social Gatherings with Friends and Family:   . Attends Religious Services:   . Active Member of Clubs or Organizations:   . Attends Archivist Meetings:   [name] Marital Status:   Intimate Partner Violence:   . Fear of Current or Ex-Partner:   . Emotionally Abused:   [name] Physically Abused:   . Sexually Abused:     Past Surgical History:  Procedure Laterality Date  . BACK SURGERY     lumbar  . COLONOSCOPY WITH PROPOFOL N/A 08/24/2019   Procedure: COLONOSCOPY WITH PROPOFOL;  Surgeon: [name], MD;  Location[facility] ENDOSCOPY;  Service: Endoscopy;  Laterality: N/A;  . ESOPHAGOGASTRODUODENOSCOPY (EGD) WITH PROPOFOL N/A 08/24/2019   Procedure: ESOPHAGOGASTRODUODENOSCOPY (EGD) WITH PROPOFOL;  Surgeon: [name], MD;  Location: [facility] ENDOSCOPY;  Service: Endoscopy;  Laterality: N/A;  . STENT PLACEMENT VASCULAR ([place] HX)  07/2019   stenosis of distal splenic artery and stent  placed  . VISCERAL ANGIOGRAPHY N/A 08/06/2019   Procedure: VISCERAL ANGIOGRAPHY;  Surgeon: [name], MD;  Location: [place] CV LAB;  Service: Cardiovascular;  Laterality: N/A;    Family History  Problem Relation Age of Onset  . Diabetes Mellitus II Mother   . Diabetes Mellitus II Brother   . Hyperlipidemia Brother   . Colon cancer Maternal Aunt   . Colon cancer Maternal Uncle     Allergies  Allergen Reactions  . Wasp Venom Anaphylaxis  . Wasp Venom Protein Anaphylaxis  . Coffee Flavor Nausea And Vomiting    Patient states coffee gives him nausea and stomach cramps  . Onion     Current Outpatient Medications  on File Prior to Visit  Medication Sig Dispense Refill  . aspirin EC 81 MG EC tablet Take 1 tablet (81 mg total) by mouth daily. 90 tablet 1  . atorvastatin (LIPITOR) 40 MG tablet Take 1 tablet (40 mg total) by mouth daily. 90 tablet 1  . clopidogrel (PLAVIX) 75 MG tablet Take 2 tablets (150 mg total) by mouth daily. 90 tablet 1  . glucose blood (RELION TRUE METRIX TEST STRIPS) test strip Use as instructed to check blood sugars twice daily. Dx E11.9 200 each 12  . insulin glargine (LANTUS) 100 UNIT/ML Solostar Pen Inject 20 Units into the skin at bedtime. 15 mL 11  . metFORMIN (GLUCOPHAGE) 1000 MG tablet Take 0.5 tablets (500 mg total) by mouth 2 (two) times daily with a meal. 180 tablet 3  . metoprolol succinate (TOPROL XL) 25 MG 24 hr tablet Take 1 tablet (25 mg total) by mouth daily. 90 tablet 3  . ofloxacin (OCUFLOX) 0.3 % ophthalmic solution ofloxacin 0.3 % eye drops  INSTILL 1 DROP INTO LEFT EYE 4 TIMES DAILY    . predniSONE (DELTASONE) 20 MG tablet Take 2 tablets (40 mg total) by mouth daily for 5 days. 10 tablet 0   No current facility-administered medications on file prior to visit.    BP (!) 146/90   Temp 97.9 F (36.6 C) (Temporal)   Ht 5\' 4"  (1.626 m)   Wt 180 lb (81.6 kg)   BMI 30.90 kg/m    Observations/Objective: Gen: Awake, alert, no  acute distress Resp: Breathing is even and non-labored Psych: calm/pleasant demeanor Neuro: Alert and Oriented x 3, + facial symmetry, speech is clear.  Imaging  results:  CLINICAL DATA:  Shortness of breath and chest pain for 3 days  EXAM: CT ANGIOGRAPHY CHEST WITH CONTRAST  TECHNIQUE: Multidetector CT imaging of the chest was performed using the standard protocol during bolus administration of intravenous contrast. Multiplanar CT image reconstructions and MIPs were obtained to evaluate the vascular anatomy.  CONTRAST:  84mL OMNIPAQUE IOHEXOL 350 MG/ML SOLN  COMPARISON:  08/21/2019  FINDINGS: Cardiovascular: Satisfactory opacification of the pulmonary arteries to the segmental level. No evidence of pulmonary embolism. Normal heart size. No pericardial effusion.  Mediastinum/Nodes: Negative for adenopathy or mass.  Lungs/Pleura: Granulomatous calcifications clustered in the left lower lobe. There is no edema, consolidation, effusion, or pneumothorax.  Upper Abdomen: Remote insult to the spleen with small heterogeneous shape and granulomatous calcifications with cystic density. There is a splenic artery stent present.  Musculoskeletal: No acute or aggressive finding  Review of the MIP images confirms the above findings.  IMPRESSION: Negative for pulmonary embolism or other acute process.   Electronically Signed   By: Monte Fantasia M.D.   On: 09/23/2019 06:49  Assessment and Plan: COPD exacerbation with normal chest x-ray, CT angio of the chest PE with and without contrast showed no PE or other acute process.    Dizziness-chronic-head CT without contrast negative, brain MRI performed 08/20/2019 did show no acute intracranial abnormality but there were findings nonspecific periventricular and scattered subcortical T2 hyperintensities bilaterally moderately advanced for age.  He has been referred to neurology.  TINNITUS-bilateral ears-onset last year and  is worse when he is around a noisy environment.   T2DM: He is on Metformin 1 g taking 1/2 tablet daily.  He absolutely cannot go higher than this. He reports he has tried several times.  He has bad diarrhea.  He is tolerating the Lantus 20 units at bedtime.  Blood  sugars are up from likely prednisone.  He does eat late supper is often after 9 PM-10 pm.  He has reported no hypoglycemic events.  He cannot tolerate Metformin  It sounds like your COPD and lung problem is getting much better.  I have placed a pulmonary referral in to address your COPD, heavy tobacco use, and to get testing to rule out sleep apnea.  You should receive a call from their office to schedule an appointment.  BP up on prednisone and will need to follow.   You have ringing in your ears, persistent dizziness, and I have placed referral into ears nose and throat specialist Dr. Mali McQueen.  You should receive a call from their office to schedule an appointment.  Your blood pressure is elevated today.  Your blood sugar is elevated today.  This is likely some of the side effects of prednisone.  We will address at office visit.  Follow Up Instructions:  I refilled your usual medications.  Please see me in the office second week in August.   I discussed the assessment and treatment plan with the patient. The patient was provided an opportunity to ask questions and all were answered. The patient agreed with the plan and demonstrated an understanding of the instructions.   The patient was advised to call back or seek an in-person evaluation if the symptoms worsen or if the condition fails to improve as anticipated.   [name], NP

## 2019-09-28 ENCOUNTER — Ambulatory Visit (INDEPENDENT_AMBULATORY_CARE_PROVIDER_SITE_OTHER): Payer: Medicare HMO

## 2019-09-28 DIAGNOSIS — R0600 Dyspnea, unspecified: Secondary | ICD-10-CM | POA: Diagnosis not present

## 2019-09-28 DIAGNOSIS — R000 Tachycardia, unspecified: Secondary | ICD-10-CM | POA: Diagnosis not present

## 2019-09-28 DIAGNOSIS — R079 Chest pain, unspecified: Secondary | ICD-10-CM | POA: Diagnosis not present

## 2019-09-28 DIAGNOSIS — R0609 Other forms of dyspnea: Secondary | ICD-10-CM

## 2019-09-28 LAB — ECHOCARDIOGRAM COMPLETE
AR max vel: 3.04 cm2
AV Area VTI: 3.12 cm2
AV Area mean vel: 3.25 cm2
AV Mean grad: 3 mmHg
AV Peak grad: 6.2 mmHg
Ao pk vel: 1.24 m/s
Area-P 1/2: 6.48 cm2
S' Lateral: 3.4 cm
Single Plane A4C EF: 51.2 %

## 2019-09-28 NOTE — Telephone Encounter (Signed)
I went through my paperwork.  I did not see this form.

## 2019-09-28 NOTE — Telephone Encounter (Signed)
Left message for [facility] to send over new form to be signed by Dr. Nicki Reaper

## 2019-09-29 ENCOUNTER — Other Ambulatory Visit: Payer: Medicare HMO | Admitting: Orthotics

## 2019-09-30 ENCOUNTER — Ambulatory Visit (INDEPENDENT_AMBULATORY_CARE_PROVIDER_SITE_OTHER): Payer: Medicare HMO | Admitting: Cardiology

## 2019-09-30 ENCOUNTER — Other Ambulatory Visit: Payer: Self-pay

## 2019-09-30 ENCOUNTER — Telehealth: Payer: Self-pay | Admitting: Podiatry

## 2019-09-30 ENCOUNTER — Encounter: Payer: Self-pay | Admitting: Cardiology

## 2019-09-30 VITALS — BP 108/70 | HR 90 | Ht 64.0 in | Wt 182.0 lb

## 2019-09-30 DIAGNOSIS — R0609 Other forms of dyspnea: Secondary | ICD-10-CM

## 2019-09-30 DIAGNOSIS — R000 Tachycardia, unspecified: Secondary | ICD-10-CM

## 2019-09-30 DIAGNOSIS — E7800 Pure hypercholesterolemia, unspecified: Secondary | ICD-10-CM

## 2019-09-30 DIAGNOSIS — F17200 Nicotine dependence, unspecified, uncomplicated: Secondary | ICD-10-CM | POA: Diagnosis not present

## 2019-09-30 DIAGNOSIS — R0600 Dyspnea, unspecified: Secondary | ICD-10-CM

## 2019-09-30 NOTE — Telephone Encounter (Signed)
Dr Garen Lah you saw this patient today- can you comment on pre op clearance for multiple dental extractions.  Dr [name] can the patient hold Plavix 5-7 days pre op? He was apparently placed on Plavix May 2021 by vascular surgery after he had a splenic artery infarct.  Please respond to CV DIV PRE OP  Thanks  Kerin Ransom PA-C 09/30/2019 11:52 AM

## 2019-09-30 NOTE — Telephone Encounter (Signed)
Spoke with [facility] in [place] and they are sending new papers for Dr. Nicki Reaper to sign for patients diabetic shoes

## 2019-09-30 NOTE — Telephone Encounter (Signed)
Received call from pt primary care doctor's office asking for the paperwork for pt to get diabetic shoes. I asked what md/do is overseeing Benjamin Paradise NP and was told Benjamin Pheasant md.  I faxed paperwork to them.

## 2019-09-30 NOTE — Progress Notes (Signed)
Cardiology Office Note:    Date:  09/30/2019   ID:  [name], DOB 07-Jul-1965, MRN [id number]  PCP:  [name], NP  [facility] Cardiologist:  [name], MD  [place] Electrophysiologist:  None   Referring MD: [name], NP   Chief Complaint  Patient presents with  . office visit    F/U after echo; Meds verbally reviewed with patient.    History of Present Illness:    Benjamin Conley is a 54 y.o. male with a hx of diabetes, splenic infarct status post stent placement, COPD, hyperlipidemia, current smoker x40+ years who presents for follow-up.  He was last seen due to shortness of breath and nonspecific chest discomfort.  Shortness of breath was attributed to COPD but due to atypical chest discomfort, echo and Myoview was ordered to evaluate presence of CAD.  Patient also states having elevated heart rates at rest with rates over 120 bpm.  This was deemed inappropriate and beta-blocker started.  His heart rates have improved since starting the beta-blocker.  Symptoms of shortness of breath have been roughly the same.  He states finding difficulty sometimes taking and into his lawn.  He has an appointment scheduled with pulmonary medicine next month.  Historical notes Patient had worsening left upper quadrant abdominal pain.  He presented to the ED about 2 weeks ago where CT abdomen showed distal splenic aneurysm, obstruction, dissection.  He was diagnosed with splenic infarct and had a stent placed to the splenic artery.  His abdominal pain symptoms have improved somewhat, he has occasional left-sided chest discomfort which he attributes to the splenic infarct.  He states having shortness of breath with exertion which is ongoing for some time now.  He attributes the shortness of breath to COPD.  He is a current smoker.  Patient also notes dizziness over the past 3 to 4 days.  Symptoms usually occur when he bends down and rises.  Or standing up from a  seated position.  Denies palpitations or irregular heartbeats.  States his heart rates have been elevated over the past 2 to 3 weeks, up to 120 bpm upon checks at home.  Past Medical History:  Diagnosis Date  . Complication of anesthesia    c/o difficulty breathing after anesthesia  . COPD (chronic obstructive pulmonary disease) ([place])   . Diabetes mellitus without complication ([place])   . Hyperlipemia   . 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 Tobacco abuse     Past Surgical History:  Procedure Laterality Date  . BACK SURGERY     lumbar  . COLONOSCOPY WITH PROPOFOL N/A 08/24/2019   Procedure: COLONOSCOPY WITH PROPOFOL;  Surgeon: [name], MD;  [facility] ENDOSCOPY;  Service: Endoscopy;  Laterality: N/A;  . ESOPHAGOGASTRODUODENOSCOPY (EGD) WITH PROPOFOL N/A 08/24/2019   Procedure: ESOPHAGOGASTRODUODENOSCOPY (EGD) WITH PROPOFOL;  Surgeon: [name], MD;  Location: [facility] ENDOSCOPY;  Service: Endoscopy;  Laterality: N/A;  . STENT PLACEMENT VASCULAR ([place] HX)  07/2019   stenosis of distal splenic artery and stent placed  . VISCERAL ANGIOGRAPHY N/A 08/06/2019   Procedure: VISCERAL ANGIOGRAPHY;  Surgeon: [name], MD;  Location: [place] CV LAB;  Service: Cardiovascular;  Laterality: N/A;    Current Medications: Current Meds  Medication Sig  . aspirin EC 81 MG EC tablet Take 1 tablet (81 mg total) by mouth daily.  [name] atorvastatin (LIPITOR) 40 MG tablet Take 1 tablet (40 mg total) by mouth daily.  . clopidogrel (PLAVIX) 75 MG tablet Take  2 tablets (150 mg total) by mouth daily.  [name] glucose blood (RELION TRUE METRIX TEST STRIPS) test strip Use as instructed to check blood sugars twice daily. Dx E11.9  . insulin glargine (LANTUS) 100 UNIT/ML Solostar Pen Inject 20 Units into the skin at bedtime.  . metFORMIN (GLUCOPHAGE) 1000 MG tablet Take 0.5 tablets (500 mg total) by mouth 2 (two) times daily with a meal.  . metFORMIN (GLUCOPHAGE) 500 MG tablet Take 1,000 mg by mouth 2 (two)  times daily with a meal.  . metoprolol succinate (TOPROL XL) 25 MG 24 hr tablet Take 1 tablet (25 mg total) by mouth daily.     Allergies:   Wasp venom, Wasp venom protein, Coffee flavor, and Onion   Social History   Socioeconomic History  . Marital status: Single    Spouse name: Not on file  . Number of children: Not on file  . Years of education: Not on file  . Highest education level: Not on file  Occupational History  . Not on file  Tobacco Use  . Smoking status: Current Every Day Smoker    Packs/day: 2.00    Years: 48.00    Pack years: 96.00    Types: Cigarettes  . Smokeless tobacco: Never Used  Vaping Use  . Vaping Use: Never used  Substance and Sexual Activity  . Alcohol use: No    Comment: Never been a problem  . Drug use: No  . Sexual activity: Yes  Other Topics Concern  . Not on file  Social History Narrative   Single, lives alone, unemployed.   Social Determinants of Health   Financial Resource Strain:   . Difficulty of Paying Living Expenses:   Food Insecurity:   . Worried About Charity fundraiser in the Last Year:   . Arboriculturist in the Last Year:   Transportation Needs:   . Film/video editor (Medical):   [name] Lack of Transportation (Non-Medical):   Physical Activity:   . Days of Exercise per Week:   . Minutes of Exercise per Session:   Stress:   . Feeling of Stress :   Social Connections:   . Frequency of Communication with Friends and Family:   . Frequency of Social Gatherings with Friends and Family:   . Attends Religious Services:   . Active Member of Clubs or Organizations:   . Attends Archivist Meetings:   [name] Marital Status:      Family History: The patient's family history includes Colon cancer in his maternal aunt and maternal uncle; Diabetes Mellitus II in his brother and mother; Hyperlipidemia in his brother.  ROS:   Please see the history of present illness.     All other systems reviewed and are  negative.  EKGs/Labs/Other Studies Reviewed:    The following studies were reviewed today:   EKG:  EKG is  ordered today.  The ekg ordered today demonstrates sinus rhythm, heart rate 90  Recent Labs: 08/07/2019: Magnesium 2.1 08/20/2019: TSH 1.68 09/06/2019: ALT 17 09/23/2019: B Natriuretic Peptide 35.9; BUN 8; Creatinine, Ser 0.64; Hemoglobin 11.4; Platelets 756; Potassium 3.8; Sodium 140  Recent Lipid Panel    Component Value Date/Time   CHOL 175 08/11/2019 1138   TRIG 184.0 (H) 08/11/2019 1138   HDL 30.80 (L) 08/11/2019 1138   CHOLHDL 6 08/11/2019 1138   VLDL 36.8 08/11/2019 1138   LDLCALC 108 (H) 08/11/2019 1138    Physical Exam:    VS:  BP 108/70 (  BP Location: Left Arm, Patient Position: Sitting, Cuff Size: Normal)   Pulse 90   Ht 5\' 4"  (1.626 m)   Wt 182 lb (82.6 kg)   SpO2 98%   BMI 31.24 kg/m     Wt Readings from Last 3 Encounters:  09/30/19 182 lb (82.6 kg)  09/27/19 180 lb (81.6 kg)  09/23/19 179 lb (81.2 kg)     GEN:  Well nourished, well developed in no acute distress HEENT: Normal NECK: No JVD; No carotid bruits LYMPHATICS: No lymphadenopathy CARDIAC: Tachycardic, regular, no murmurs, rubs, gallops RESPIRATORY:  Clear to auscultation without rales, wheezing or rhonchi  ABDOMEN: Soft, non-tender, non-distended MUSCULOSKELETAL:  No edema; No deformity  SKIN: Warm and dry NEUROLOGIC:  Alert and oriented x 3 PSYCHIATRIC:  Normal affect   ASSESSMENT:    1. Dyspnea on exertion   2. Inappropriate sinus tachycardia   3. Pure hypercholesterolemia   4. Smoking    PLAN:    In order of problems listed above:  1. Patient with dyspnea on exertion and atypical chest pain.  Echocardiogram showed normal systolic and diastolic function, EF 55 to 60%.  Lexiscan with no evidence for ischemia, low risk scan.  Symptoms likely secondary to COPD.  Patient has appointment to see pulmonary medicine.  I recommend he keep that appointment.  Also will follow-up with PCP  regarding prescription of a rescue inhaler if needed. 2. Patient's heart was previously elevated, deemed inappropriate sinus tach.  Started on Toprol-XL with improved heart rates.  Continue Toprol-XL as prescribed.  EKG today shows sinus rhythm with heart rate 90s. 3. History of hyperlipidemia continue statin as prescribed. 4. Patient is a current smoker.  Smoking cessation strongly advised.  Follow-up in 6 months  Total encounter time 42 minutes  Greater than 50% was spent in counseling and coordination of care with the patient Including but not limited to explanation of test results, recommendations regarding pulmonary medicine referral    Medication Adjustments/Labs and Tests Ordered: Current medicines are reviewed at length with the patient today.  Concerns regarding medicines are outlined above.  Orders Placed This Encounter  Procedures  . EKG 12-Lead   No orders of the defined types were placed in this encounter.   Patient Instructions  Medication Instructions:  Your physician recommends that you continue on your current medications as directed. Please refer to the Current Medication list given to you today.  *If you need a refill on your cardiac medications before your next appointment, please call your pharmacy*   Lab Work: None ordered If you have labs (blood work) drawn today and your tests are completely normal, you will receive your results only by: [name] MyChart Message (if you have MyChart) OR . A paper copy in the mail If you have any lab test that is abnormal or we need to change your treatment, we will call you to review the results.   Testing/Procedures: None ordered   Follow-Up: At [facility], you and your health needs are our priority.  As part of our continuing mission to provide you with exceptional heart care, we have created designated Provider Care Teams.  These Care Teams include your primary Cardiologist (physician) and Advanced Practice Providers  (APPs -  Physician Assistants and Nurse Practitioners) who all work together to provide you with the care you need, when you need it.  We recommend signing up for the patient portal called "MyChart".  Sign up information is provided on this After Visit Summary.  MyChart is  used to connect with patients for Virtual Visits (Telemedicine).  Patients are able to view lab/test results, encounter notes, upcoming appointments, etc.  Non-urgent messages can be sent to your provider as well.   To learn more about what you can do with MyChart, go to [email or web address].    Your next appointment:   6 month(s)  The format for your next appointment:   In Person  Provider:    You may see [name], MD or one of the following Advanced Practice Providers on your designated Care Team:    Murray Hodgkins, NP  Christell Faith, PA-C  Marrianne Mood, PA-C      Signed, [name], MD  09/30/2019 1:31 PM    [place]

## 2019-09-30 NOTE — Patient Instructions (Signed)
Medication Instructions:  Your physician recommends that you continue on your current medications as directed. Please refer to the Current Medication list given to you today.  *If you need a refill on your cardiac medications before your next appointment, please call your pharmacy*   Lab Work: None ordered If you have labs (blood work) drawn today and your tests are completely normal, you will receive your results only by: [name] MyChart Message (if you have MyChart) OR . A paper copy in the mail If you have any lab test that is abnormal or we need to change your treatment, we will call you to review the results.   Testing/Procedures: None ordered   Follow-Up: At [facility], you and your health needs are our priority.  As part of our continuing mission to provide you with exceptional heart care, we have created designated Provider Care Teams.  These Care Teams include your primary Cardiologist (physician) and Advanced Practice Providers (APPs -  Physician Assistants and Nurse Practitioners) who all work together to provide you with the care you need, when you need it.  We recommend signing up for the patient portal called "MyChart".  Sign up information is provided on this After Visit Summary.  MyChart is used to connect with patients for Virtual Visits (Telemedicine).  Patients are able to view lab/test results, encounter notes, upcoming appointments, etc.  Non-urgent messages can be sent to your provider as well.   To learn more about what you can do with MyChart, go to [email or web address].    Your next appointment:   6 month(s)  The format for your next appointment:   In Person  Provider:    You may see [name], MD or one of the following Advanced Practice Providers on your designated Care Team:    Murray Hodgkins, NP  Christell Faith, PA-C  Marrianne Mood, PA-C

## 2019-10-01 ENCOUNTER — Other Ambulatory Visit (INDEPENDENT_AMBULATORY_CARE_PROVIDER_SITE_OTHER): Payer: Self-pay | Admitting: Vascular Surgery

## 2019-10-01 DIAGNOSIS — Z95820 Peripheral vascular angioplasty status with implants and grafts: Secondary | ICD-10-CM

## 2019-10-01 DIAGNOSIS — D735 Infarction of spleen: Secondary | ICD-10-CM

## 2019-10-01 NOTE — Telephone Encounter (Signed)
Forms were faxed to [place] yesterday and should be in your folder to sign

## 2019-10-02 NOTE — Telephone Encounter (Signed)
Form given to [employer].  Has an appt in August.  After evaluation, will give to me to sign.

## 2019-10-03 DIAGNOSIS — I748 Embolism and thrombosis of other arteries: Secondary | ICD-10-CM | POA: Insufficient documentation

## 2019-10-03 NOTE — Progress Notes (Signed)
MRN : [id number]  Benjamin Conley is a 54 y.o. (10-18-1965) male who presents with chief complaint of No chief complaint on file. Benjamin Conley  History of Present Illness:   The patient returns to the office for followup and review status post splenic artery angiogram with intervention.   Balloon expandable stent placement to the splenic artery using a 4.5 mm diameter x 15 mm length resolute Onyx balloon expandable stent  He notes his flank pain has resolved.  The patient also notes deterioration in his lower extremity symptoms. There has been interval shortening of the patient's claudication distance and mild rest pain symptoms.  No new ulcers or wounds have occurred since the last visit.  There have been no significant changes to the patient's overall health care.  The patient denies amaurosis fugax or recent TIA symptoms. There are no recent neurological changes noted. The patient denies history of DVT, PE or superficial thrombophlebitis. The patient denies recent episodes of angina or shortness of breath.   Duplex US of the splenic artery shows widely patent artery  No outpatient medications have been marked as taking for the 10/04/19 encounter (Appointment) with Benjamin Conley, Benjamin Lory, MD.    Past Medical History:  Diagnosis Date  . Complication of anesthesia    c/o difficulty breathing after anesthesia  . COPD (chronic obstructive pulmonary disease) ([place])   . Diabetes mellitus without complication (Benjamin Conley)   . Hyperlipemia   . 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 Tobacco abuse     Past Surgical History:  Procedure Laterality Date  . BACK SURGERY     lumbar  . COLONOSCOPY WITH PROPOFOL N/A 08/24/2019   Procedure: COLONOSCOPY WITH PROPOFOL;  Surgeon: [name], MD;  Location: [facility] ENDOSCOPY;  Service: Endoscopy;  Laterality: N/A;  . ESOPHAGOGASTRODUODENOSCOPY (EGD) WITH PROPOFOL N/A 08/24/2019   Procedure: ESOPHAGOGASTRODUODENOSCOPY (EGD) WITH PROPOFOL;  Surgeon: [name], MD;   Location: [facility] ENDOSCOPY;  Service: Endoscopy;  Laterality: N/A;  . STENT PLACEMENT VASCULAR ([place] [facility])  07/2019   stenosis of distal splenic artery and stent placed  . VISCERAL ANGIOGRAPHY N/A 08/06/2019   Procedure: VISCERAL ANGIOGRAPHY;  Surgeon: [name], MD;  Location: [place] CV LAB;  Service: Cardiovascular;  Laterality: N/A;    Social History Social History   Tobacco Use  . Smoking status: Current Every Day Smoker    Packs/day: 2.00    Years: 48.00    Pack years: 96.00    Types: Cigarettes  . Smokeless tobacco: Never Used  Vaping Use  . Vaping Use: Never used  Substance Use Topics  . Alcohol use: No    Comment: Never been a problem  . Drug use: No    Family History Family History  Problem Relation Age of Onset  . Diabetes Mellitus II Mother   . Diabetes Mellitus II Brother   . Hyperlipidemia Brother   . Colon cancer Maternal Aunt   . Colon cancer Maternal Uncle     Allergies  Allergen Reactions  . Wasp Venom Anaphylaxis  . Wasp Venom Protein Anaphylaxis  . Coffee Flavor Nausea And Vomiting    Patient states coffee gives him nausea and stomach cramps  . Onion      REVIEW OF SYSTEMS (Negative unless checked)  Constitutional: [] Weight loss  [] Fever  [] Chills Cardiac: [] Chest pain   [] Chest pressure   [] Palpitations   [] Shortness of breath when laying flat   [] Shortness of breath with exertion. Vascular:  [x] Pain in legs with walking   [x] Pain in  legs at rest  [] History of DVT   [] Phlebitis   [x] Swelling in legs   [] Varicose veins   [] Non-healing ulcers Pulmonary:   [] Uses home oxygen   [] Productive cough   [] Hemoptysis   [] Wheeze  [] COPD   [] Asthma Neurologic:  [] Dizziness   [] Seizures   [] History of stroke   [] History of TIA  [] Aphasia   [] Vissual changes   [] Weakness or numbness in arm   [] Weakness or numbness in leg Musculoskeletal:   [] Joint swelling   [x] Joint pain   [] Low back pain Hematologic:  [] Easy bruising  [] Easy bleeding    [] Hypercoagulable state   [] Anemic Gastrointestinal:  [] Diarrhea   [] Vomiting  [] Gastroesophageal reflux/heartburn   [] Difficulty swallowing. Genitourinary:  [] Chronic kidney disease   [] Difficult urination  [] Frequent urination   [] Blood in urine Skin:  [] Rashes   [] Ulcers  Psychological:  [] History of anxiety   []  History of major depression.  Physical Examination  There were no vitals filed for this visit. There is no height or weight on file to calculate BMI. Gen: WD/WN, NAD Head: [place]/AT, No temporalis wasting.  Ear/Nose/Throat: Hearing grossly intact, nares w/o erythema or drainage Eyes: PER, EOMI, sclera nonicteric.  Neck: Supple, no large masses.   Pulmonary:  Good air movement, no audible wheezing bilaterally, no use of accessory muscles.  Cardiac: RRR, no JVD Vascular:  Vessel Right Left  Radial Palpable Palpable  PT Not Palpable Not Palpable  DP Not Palpable Not Palpable  Gastrointestinal: Non-distended. No guarding/no peritoneal signs.  Musculoskeletal: M/S 5/5 throughout.  No deformity or atrophy.  Neurologic: CN 2-12 intact. Symmetrical.  Speech is fluent. Motor exam as listed above. Psychiatric: Judgment intact, Mood & affect appropriate for pt's clinical situation. Dermatologic: No rashes or ulcers noted.  No changes consistent with cellulitis.  CBC Lab Results  Component Value Date   WBC 9.7 09/23/2019   HGB 11.4 (L) 09/23/2019   HCT 34.6 (L) 09/23/2019   MCV 88.9 09/23/2019   PLT 756 (H) 09/23/2019    BMET    Component Value Date/Time   NA 140 09/23/2019 0100   NA 137 08/30/2013 2322   K 3.8 09/23/2019 0100   K 3.5 08/30/2013 2322   CL 103 09/23/2019 0100   CL 101 08/30/2013 2322   CO2 25 09/23/2019 0100   CO2 23 08/30/2013 2322   GLUCOSE 126 (H) 09/23/2019 0100   GLUCOSE 374 (H) 08/30/2013 2322   BUN 8 09/23/2019 0100   BUN 13 08/30/2013 2322   CREATININE 0.64 09/23/2019 0100   CREATININE 0.69 08/30/2013 2322   CALCIUM 9.1 09/23/2019 0100    CALCIUM 9.1 08/30/2013 2322   GFRNONAA >60 09/23/2019 0100   GFRNONAA >60 08/30/2013 2322   GFRAA >60 09/23/2019 0100   GFRAA >60 08/30/2013 2322   Estimated Creatinine Clearance: 103.6 mL/min (by C-G formula based on SCr of 0.64 mg/dL).  COAG Lab Results  Component Value Date   INR 1.0 08/21/2019   INR 0.9 08/01/2019    Radiology DG Chest 2 View  Result Date: 09/23/2019 CLINICAL DATA:  54 year old male with shortness of breath. EXAM: CHEST - 2 VIEW COMPARISON:  Chest radiograph dated 08/20/2019. FINDINGS: The heart size and mediastinal contours are within normal limits. Both lungs are clear. The visualized skeletal structures are unremarkable. IMPRESSION: No active cardiopulmonary disease. Electronically Signed   By: Anner Crete M.D.   On: 09/23/2019 01:25   CT Head Wo Contrast  Result Date: 09/23/2019 CLINICAL DATA:  Shortness of breath and chest  pain for 3 days. Headaches EXAM: CT HEAD WITHOUT CONTRAST TECHNIQUE: Contiguous axial images were obtained from the base of the skull through the vertex without intravenous contrast. COMPARISON:  Brain MRI 08/20/2019 FINDINGS: Brain: No evidence of acute infarction, hemorrhage, hydrocephalus, extra-axial collection or mass lesion/mass effect. Mild periventricular chronic white matter disease. Vascular: No hyperdense vessel. Skull: Normal. Negative for fracture or focal lesion. Sinuses/Orbits: Negative IMPRESSION: No acute finding or change from a recent brain MRI. Electronically Signed   By: Monte Fantasia M.D.   On: 09/23/2019 06:40   CT Angio Chest PE W and/or Wo Contrast  Result Date: 09/23/2019 CLINICAL DATA:  Shortness of breath and chest pain for 3 days EXAM: CT ANGIOGRAPHY CHEST WITH CONTRAST TECHNIQUE: Multidetector CT imaging of the chest was performed using the standard protocol during bolus administration of intravenous contrast. Multiplanar CT image reconstructions and MIPs were obtained to evaluate the vascular anatomy.  CONTRAST:  49mL OMNIPAQUE IOHEXOL 350 MG/ML SOLN COMPARISON:  08/21/2019 FINDINGS: Cardiovascular: Satisfactory opacification of the pulmonary arteries to the segmental level. No evidence of pulmonary embolism. Normal heart size. No pericardial effusion. Mediastinum/Nodes: Negative for adenopathy or mass. Lungs/Pleura: Granulomatous calcifications clustered in the left lower lobe. There is no edema, consolidation, effusion, or pneumothorax. Upper Abdomen: Remote insult to the spleen with small heterogeneous shape and granulomatous calcifications with cystic density. There is a splenic artery stent present. Musculoskeletal: No acute or aggressive finding Review of the MIP images confirms the above findings. IMPRESSION: Negative for pulmonary embolism or other acute process. Electronically Signed   By: Monte Fantasia M.D.   On: 09/23/2019 06:49   ECHOCARDIOGRAM COMPLETE  Result Date: 09/28/2019    ECHOCARDIOGRAM REPORT   Patient Name:   [facility] RAY Shadwick Date of Exam: 09/28/2019 Medical Rec #:  [id number]          Height:       64.0 in Accession #:    [id number]         Weight:       180.0 lb Date of Birth:  Aug 20, 1965          BSA:          1.871 m Patient Age:    11 years           BP:           132/78 mmHg Patient Gender: M                  HR:           95 bpm. Exam Location:  [place] Procedure: 2D Echo, Cardiac Doppler and Color Doppler Indications:    R07.9* Chest pain, unspecified; R06.02 SOB; R42 Lightheaded  History:        Patient has no prior history of Echocardiogram examinations.                 COPD, Signs/Symptoms:Dizziness/Lightheadedness, Dyspnea and                 Shortness of Breath; Risk Factors:Hypertension, Diabetes,                 Dyslipidemia and Current Smoker.  Sonographer:    Pilar Jarvis RDMS, RVT, RDCS Referring Phys: [id number] BRIAN AGBOR-ETANG IMPRESSIONS  1. Left ventricular ejection fraction, by estimation, is 55 to 60%. The left ventricle has normal function. The left ventricle  has no regional wall motion abnormalities. Left ventricular diastolic parameters were normal. The average left ventricular global longitudinal strain is -10.0 %.  2. Right ventricular systolic function is normal. The right ventricular size is normal. Tricuspid regurgitation signal is inadequate for assessing PA pressure.  3. The mitral valve is normal in structure. Trivial mitral valve regurgitation. No evidence of mitral stenosis.  4. The aortic valve is grossly normal. Aortic valve regurgitation is not visualized. No aortic stenosis is present.  5. The inferior vena cava is dilated in size with >50% respiratory variability, suggesting right atrial pressure of 8 mmHg. FINDINGS  Left Ventricle: Left ventricular ejection fraction, by estimation, is 55 to 60%. The left ventricle has normal function. The left ventricle has no regional wall motion abnormalities. The average left ventricular global longitudinal strain is -10.0 %. The left ventricular internal cavity size was normal in size. There is no left ventricular hypertrophy. Left ventricular diastolic parameters were normal. Right Ventricle: The right ventricular size is normal. No increase in right ventricular wall thickness. Right ventricular systolic function is normal. Tricuspid regurgitation signal is inadequate for assessing PA pressure. Left Atrium: Left atrial size was normal in size. Right Atrium: Right atrial size was normal in size. Pericardium: There is no evidence of pericardial effusion. Mitral Valve: The mitral valve is normal in structure. Trivial mitral valve regurgitation. No evidence of mitral valve stenosis. Tricuspid Valve: The tricuspid valve is normal in structure. Tricuspid valve regurgitation is trivial. Aortic Valve: The aortic valve is grossly normal. Aortic valve regurgitation is not visualized. No aortic stenosis is present. Aortic valve mean gradient measures 3.0 mmHg. Aortic valve peak gradient measures 6.2 mmHg. Aortic valve area,  by VTI measures 3.12 cm. Pulmonic Valve: The pulmonic valve was normal in structure. Pulmonic valve regurgitation is not visualized. No evidence of pulmonic stenosis. Aorta: The aortic root and ascending aorta are structurally normal, with no evidence of dilitation. Pulmonary Artery: The pulmonary artery is of normal size. Venous: The inferior vena cava is dilated in size with greater than 50% respiratory variability, suggesting right atrial pressure of 8 mmHg. IAS/Shunts: No atrial level shunt detected by color flow Doppler.  LEFT VENTRICLE PLAX 2D LVIDd:         5.00 cm     Diastology LVIDs:         3.40 cm     LV e' lateral:   11.00 cm/s LV PW:         1.00 cm     LV E/e' lateral: 9.3 LV IVS:        0.90 cm     LV e' medial:    8.81 cm/s LVOT diam:     2.20 cm     LV E/e' medial:  11.6 LV SV:         78 LV SV Index:   42          2D Longitudinal Strain LVOT Area:     3.80 cm    2D Strain GLS Avg:     -10.0 %  LV Volumes (MOD) LV vol d, MOD A4C: 94.0 ml 3D Volume EF: LV vol s, MOD A4C: 45.9 ml 3D EF:        56 % LV SV MOD A4C:     94.0 ml LV EDV:       121 ml                            LV ESV:       54 ml  LV SV:        68 ml RIGHT VENTRICLE             IVC RV Basal diam:  3.20 cm     IVC diam: 2.10 cm RV S prime:     14.50 cm/s TAPSE (M-mode): 2.6 cm LEFT ATRIUM             Index       RIGHT ATRIUM           Index LA diam:        3.90 cm 2.08 cm/m  RA Area:     15.70 cm LA Vol (A2C):   46.7 ml 24.96 ml/m RA Volume:   39.70 ml  21.22 ml/m LA Vol (A4C):   34.3 ml 18.34 ml/m LA Biplane Vol: 40.0 ml 21.38 ml/m  AORTIC VALVE                   PULMONIC VALVE AV Area (Vmax):    3.04 cm    PV Vmax:       0.75 m/s AV Area (Vmean):   3.25 cm    PV Peak grad:  2.2 mmHg AV Area (VTI):     3.12 cm AV Vmax:           124.00 cm/s AV Vmean:          85.900 cm/s AV VTI:            0.251 m AV Peak Grad:      6.2 mmHg AV Mean Grad:      3.0 mmHg LVOT Vmax:         99.20 cm/s LVOT Vmean:         73.400 cm/s LVOT VTI:          0.206 m LVOT/AV VTI ratio: 0.82  AORTA Ao Root diam: 3.30 cm Ao Asc diam:  3.50 cm Ao Arch diam: 3.3 cm MITRAL VALVE MV Area (PHT): 6.48 cm     SHUNTS MV Decel Time: 117 msec     Systemic VTI:  0.21 m MV E velocity: 102.00 cm/s  Systemic Diam: 2.20 cm MV A velocity: 66.00 cm/s MV E/A ratio:  1.55 Harrell Gave End MD Electronically signed by Nelva Bush MD Signature Date/Time: 09/28/2019/4:30:42 PM    Final      Assessment/Plan 1. Splenic infarct Symptoms have resolved continue to follow with duplex  2. Atherosclerosis of native artery of both lower extremities with rest pain ([place]) Recommend:  Patient should undergo arterial duplex of the lower extremity ASAP because there has been a significant deterioration in the patient's lower extremity symptoms.  The patient states they are having increased pain and a marked decrease in the distance that they can walk.  The risks and benefits as well as the alternatives were discussed in detail with the patient.  All questions were answered.  Patient agrees to proceed and understands this could be a prelude to angiography and intervention.  The patient will follow up with me in the office to review the studies.  - VAS [place] LOWER EXTREMITY ARTERIAL DUPLEX; Future - VAS [place] ABI WITH/WO TBI; Future  3. Essential hypertension Continue antihypertensive medications as already ordered, these medications have been reviewed and there are no changes at this time.   4. COPD with chronic bronchitis ([place]) Continue pulmonary medications and aerosols as already ordered, these medications have been reviewed and there are no changes at this time.    5. Diabetes mellitus without  complication ([place]) Continue hypoglycemic medications as already ordered, these medications have been reviewed and there are no changes at this time.  Hgb A1C to be monitored as already arranged by primary service   6. Hyperlipidemia, unspecified  hyperlipidemia type Continue statin as ordered and reviewed, no changes at this time     Hortencia Pilar, MD  10/03/2019 11:50 AM

## 2019-10-04 ENCOUNTER — Telehealth: Payer: Self-pay | Admitting: Nurse Practitioner

## 2019-10-04 ENCOUNTER — Other Ambulatory Visit: Payer: Self-pay

## 2019-10-04 ENCOUNTER — Encounter (INDEPENDENT_AMBULATORY_CARE_PROVIDER_SITE_OTHER): Payer: Self-pay | Admitting: Vascular Surgery

## 2019-10-04 ENCOUNTER — Ambulatory Visit (INDEPENDENT_AMBULATORY_CARE_PROVIDER_SITE_OTHER): Payer: Medicare HMO

## 2019-10-04 ENCOUNTER — Ambulatory Visit (INDEPENDENT_AMBULATORY_CARE_PROVIDER_SITE_OTHER): Payer: Medicare HMO | Admitting: Vascular Surgery

## 2019-10-04 VITALS — BP 100/68 | HR 99 | Resp 16 | Wt 178.0 lb

## 2019-10-04 DIAGNOSIS — J449 Chronic obstructive pulmonary disease, unspecified: Secondary | ICD-10-CM

## 2019-10-04 DIAGNOSIS — I10 Essential (primary) hypertension: Secondary | ICD-10-CM

## 2019-10-04 DIAGNOSIS — D735 Infarction of spleen: Secondary | ICD-10-CM | POA: Diagnosis not present

## 2019-10-04 DIAGNOSIS — I70223 Atherosclerosis of native arteries of extremities with rest pain, bilateral legs: Secondary | ICD-10-CM

## 2019-10-04 DIAGNOSIS — E119 Type 2 diabetes mellitus without complications: Secondary | ICD-10-CM

## 2019-10-04 DIAGNOSIS — E785 Hyperlipidemia, unspecified: Secondary | ICD-10-CM | POA: Diagnosis not present

## 2019-10-04 DIAGNOSIS — Z95820 Peripheral vascular angioplasty status with implants and grafts: Secondary | ICD-10-CM | POA: Diagnosis not present

## 2019-10-04 MED ORDER — ALBUTEROL SULFATE HFA 108 (90 BASE) MCG/ACT IN AERS
2.0000 | INHALATION_SPRAY | Freq: Four times a day (QID) | RESPIRATORY_TRACT | 3 refills | Status: DC | PRN
Start: 2019-10-04 — End: 2020-10-16

## 2019-10-04 NOTE — Telephone Encounter (Signed)
Please call him and advise that he should have an albuterol inhaler in his possession to use if he ever feels that chest tightness again like he did before he went into the hospital.   Please have him ask the pharmacist to demonstrate how to use this inhaler properly.   Please keep appt with me as arranged. Bring in the meds he is taking and his BS readings. I can help him with the inhaler if he has further questions.

## 2019-10-05 DIAGNOSIS — H9319 Tinnitus, unspecified ear: Secondary | ICD-10-CM | POA: Diagnosis not present

## 2019-10-05 DIAGNOSIS — R42 Dizziness and giddiness: Secondary | ICD-10-CM | POA: Diagnosis not present

## 2019-10-05 DIAGNOSIS — H903 Sensorineural hearing loss, bilateral: Secondary | ICD-10-CM | POA: Diagnosis not present

## 2019-10-05 NOTE — Telephone Encounter (Signed)
Patient aware to pick up inhaler and bring meds and BS readings to his appt next month

## 2019-10-06 ENCOUNTER — Encounter (INDEPENDENT_AMBULATORY_CARE_PROVIDER_SITE_OTHER): Payer: Self-pay | Admitting: Vascular Surgery

## 2019-10-06 ENCOUNTER — Ambulatory Visit: Payer: Medicare HMO | Admitting: Nurse Practitioner

## 2019-10-06 ENCOUNTER — Encounter: Payer: Self-pay | Admitting: Nurse Practitioner

## 2019-10-08 ENCOUNTER — Telehealth: Payer: Self-pay | Admitting: *Deleted

## 2019-10-08 NOTE — Chronic Care Management (AMB) (Signed)
  Chronic Care Management   Note  10/08/2019 Name: Benjamin Conley MRN: [id number] DOB: 1966/03/04  Benjamin Conley is a 54 y.o. year old male who is a primary care patient of [name], NP and is actively engaged with the care management team. I reached out to Benjamin Conley by phone today to assist with re-scheduling a follow up visit with the Pharmacist  Follow up plan: Telephone appointment with care management team member scheduled for: 10/11/2019 @1430   [address], [place], [place] [address] Direct Dial: [email or web address]

## 2019-10-11 ENCOUNTER — Telehealth: Payer: Medicare HMO

## 2019-10-11 ENCOUNTER — Encounter: Payer: Self-pay | Admitting: Pharmacist

## 2019-10-11 ENCOUNTER — Ambulatory Visit (INDEPENDENT_AMBULATORY_CARE_PROVIDER_SITE_OTHER): Payer: Medicare HMO | Admitting: Pharmacist

## 2019-10-11 DIAGNOSIS — E119 Type 2 diabetes mellitus without complications: Secondary | ICD-10-CM

## 2019-10-11 DIAGNOSIS — Z720 Tobacco use: Secondary | ICD-10-CM

## 2019-10-11 NOTE — Patient Instructions (Signed)
Visit Information  Goals Addressed              This Visit's Progress     Patient Stated   .  PharmD "I want to stay healthy" (pt-stated)        CARE PLAN ENTRY (see longitudinal plan of care for additional care plan information)  Current Barriers:  . Diabetes: uncontrolled; complicated by chronic medical conditions including splenic infarct , most recent A1c 11.3% o Reports his biggest concern today is "cutting his bad habits" - soda consumption and smoking o Is overwhelmed with multiple appointments coming up: Vascular 8/11; PCP 8/12, Audiology 8/13, Neurology 8/17, Pulm 8/26, Podiatry 9/30 . Most recent eGFR: >60 mL/min . Current antihyperglycemic regimen: metformin 500 mg BID, Lantus 20 units daily o Dose limited by diarrhea. Has never tried extended release metformin . Denies any episodes of hypoglycemia  . Current glucose readings:  o Fastings 150-170 o Post prandial running 270-300s . Current meal patterns: o Drinks: was drinking 24 pack of pepsi daily, now has cut back to a 24 pack lasting a week. Down to ~5 drinks per day. No sugar added juice, sometimes flavored water  . Cardiovascular risk reduction: o Current hypertensive regimen: metoprolol succinate 25 mg daily  o Current hyperlipidemia regimen: atorvastatin 40 mg daily;  o Current antiplatelet regimen: ASA 81 mg daily + clopidogrel 150 mg daily (s/p splenic infarction) . Tobacco Abuse: smoking 1.5 ppd, started at age 44. Quit up to a year before, but restarted after Chantix was discontinued. Notes that he has lots of worries recently about his health, and uses cigarettes to deal with the worries. Is amenable to re-starting Chantix at this time.   Pharmacist Clinical Goal(s):  [name] Over the next 90 days, patient will work with PharmD and primary care provider to address optimized medication management  Interventions: . Comprehensive medication review performed, medication list updated in electronic medical  record . Inter-disciplinary care team collaboration (see longitudinal plan of care) . Patient wants to set a goal of reducing soda to no more than 3 servings per day. Will f/u on this goal moving forward . Discussed tobacco cessation. Recommend restarting Chantix. Will discuss w/ PCP. Encouraged patient to also contact [facility] for CBT support through tobacco cessation. . Recommend changing metformin to XR formulation, as this may allow for dose increases moving forward. Will discuss w/ PCP . Moving forward, consider switching basal insulin to GLP1.  . Moving forward, will discuss referral to [facility] for RN CM for disease self-managed education and support. Patient was not interested in any extra calls at this time.  Patient Self Care Activities:  . Patient will check blood glucose BID, document, and provide at future appointments . Patient will take medications as prescribed . Patient will report any questions or concerns to provider   Please see past updates related to this goal by clicking on the "Past Updates" button in the selected goal         The patient verbalized understanding of instructions provided today and declined a print copy of patient instruction materials.   Plan:  - Scheduled f/u call in ~ 4 weeks  Catie Darnelle Maffucci, PharmD, [place], [place] Pharmacist [employer] [phone]

## 2019-10-11 NOTE — Chronic Care Management (AMB) (Signed)
Chronic Care Management   Follow Up Note   10/11/2019 Name: Benjamin Conley MRN: [id number] DOB: 05/13/1965  Referred by: [name], NP Reason for referral : Chronic Care Management (Medication Management)   Benjamin Conley is a 54 y.o. year old male who is a primary care patient of [name], NP. The CCM team was consulted for assistance with chronic disease management and care coordination needs.    Contacted patient for medication management review.  Review of patient status, including review of consultants reports, relevant laboratory and other test results, and collaboration with appropriate care team members and the patient's provider was performed as part of comprehensive patient evaluation and provision of chronic care management services.    SDOH (Social Determinants of Health) assessments performed: Yes See Care Plan activities for detailed interventions related to SDOH)  SDOH Interventions     Most Recent Value  SDOH Interventions  SDOH Interventions for the Following Domains Tobacco  Stress Interventions Provide Counseling  Tobacco Interventions Cessation Materials Given and Reviewed       Outpatient Encounter Medications as of 10/11/2019  Medication Sig  . aspirin EC 81 MG EC tablet Take 1 tablet (81 mg total) by mouth daily.  . clopidogrel (PLAVIX) 75 MG tablet Take 2 tablets (150 mg total) by mouth daily.  [name] glucose blood (RELION TRUE METRIX TEST STRIPS) test strip Use as instructed to check blood sugars twice daily. Dx E11.9  . insulin glargine (LANTUS) 100 UNIT/ML Solostar Pen Inject 20 Units into the skin at bedtime.  . metFORMIN (GLUCOPHAGE) 1000 MG tablet Take 0.5 tablets (500 mg total) by mouth 2 (two) times daily with a meal.  . albuterol (VENTOLIN HFA) 108 (90 Base) MCG/ACT inhaler Inhale 2 puffs into the lungs every 6 (six) hours as needed for wheezing or shortness of breath.  [name] atorvastatin (LIPITOR) 40 MG tablet Take 1 tablet (40 mg  total) by mouth daily.  . metoprolol succinate (TOPROL XL) 25 MG 24 hr tablet Take 1 tablet (25 mg total) by mouth daily.  [name] ofloxacin (OCUFLOX) 0.3 % ophthalmic solution ofloxacin 0.3 % eye drops  INSTILL 1 DROP INTO LEFT EYE 4 TIMES DAILY (Patient not taking: Reported on 10/04/2019)  . [DISCONTINUED] metFORMIN (GLUCOPHAGE) 500 MG tablet Take 1,000 mg by mouth 2 (two) times daily with a meal.   No facility-administered encounter medications on file as of 10/11/2019.     Objective:   Goals Addressed              This Visit's Progress     Patient Stated   .  PharmD "I want to stay healthy" (pt-stated)        CARE PLAN ENTRY (see longitudinal plan of care for additional care plan information)  Current Barriers:  . Diabetes: uncontrolled; complicated by chronic medical conditions including splenic infarct , most recent A1c 11.3% o Reports his biggest concern today is "cutting his bad habits" - soda consumption and smoking o Is overwhelmed with multiple appointments coming up: Vascular 8/11; PCP 8/12, Audiology 8/13, Neurology 8/17, Pulm 8/26, Podiatry 9/30 . Most recent eGFR: >60 mL/min . Current antihyperglycemic regimen: metformin 500 mg BID, Lantus 20 units daily o Dose limited by diarrhea. Has never tried extended release metformin . Denies any episodes of hypoglycemia  . Current glucose readings:  o Fastings 150-170 o Post prandial running 270-300s . Current meal patterns: o Drinks: was drinking 24 pack of pepsi daily, now has cut back to a 24  pack lasting a week. Down to ~5 drinks per day. No sugar added juice, sometimes flavored water  . Cardiovascular risk reduction: o Current hypertensive regimen: metoprolol succinate 25 mg daily  o Current hyperlipidemia regimen: atorvastatin 40 mg daily;  o Current antiplatelet regimen: ASA 81 mg daily + clopidogrel 150 mg daily (s/p splenic infarction) . Tobacco Abuse: smoking 1.5 ppd, started at age 86. Quit up to a year before, but  restarted after Chantix was discontinued. Notes that he has lots of worries recently about his health, and uses cigarettes to deal with the worries. Is amenable to re-starting Chantix at this time.   Pharmacist Clinical Goal(s):  [name] Over the next 90 days, patient will work with PharmD and primary care provider to address optimized medication management  Interventions: . Comprehensive medication review performed, medication list updated in electronic medical record . Inter-disciplinary care team collaboration (see longitudinal plan of care) . Patient wants to set a goal of reducing soda to no more than 3 servings per day. Will f/u on this goal moving forward . Discussed tobacco cessation. Recommend restarting Chantix. Will discuss w/ PCP. Encouraged patient to also contact [facility] for CBT support through tobacco cessation. . Recommend changing metformin to XR formulation, as this may allow for dose increases moving forward. Will discuss w/ PCP . Moving forward, consider switching basal insulin to GLP1.  . Moving forward, will discuss referral to [facility] for RN CM for disease self-managed education and support. Patient was not interested in any extra calls at this time.  Patient Self Care Activities:  . Patient will check blood glucose BID, document, and provide at future appointments . Patient will take medications as prescribed . Patient will report any questions or concerns to provider   Please see past updates related to this goal by clicking on the "Past Updates" button in the selected goal          Plan:  - Scheduled f/u call in ~ 4 weeks  Catie Darnelle Maffucci, PharmD, [place], [place] Pharmacist [place] [phone]

## 2019-10-11 NOTE — Telephone Encounter (Signed)
   Primary Cardiologist: [name], MD  Chart reviewed as part of pre-operative protocol coverage. He has a history of DOE, tobacco use, inappropriate sinus tachycardia. Lexiscan Myoview 09/01/19 was low risk. Echo 09/2019 showed normal LVEF and no wall motion abnormalities. He was last seen in clinic 09/30/19 by Dr. Garen Lah. Given past medical history and time since last visit, based on ACC/AHA guidelines, Benjamin Conley would be at acceptable risk for the planned procedure without further cardiovascular testing.   Procedure: 7 extractions Date: TBD  Of note, he is on Plavix which was started by Vascular Surgery (Dr. [name]) after distal splenic artery stent infarct. Will defer recommendations for holding Plavix to Dr. [name]. Of note, the patient has upcoming imaging with their office and appointment on 10/20/19. Will route to them as FYI.   I will route this recommendation to the requesting party via Epic fax function and remove from pre-op pool.  Please call with questions.  Loel Dubonnet, NP 10/11/2019, 2:33 PM

## 2019-10-12 ENCOUNTER — Telehealth: Payer: Medicare HMO

## 2019-10-13 ENCOUNTER — Encounter (INDEPENDENT_AMBULATORY_CARE_PROVIDER_SITE_OTHER): Payer: Self-pay

## 2019-10-13 MED ORDER — CHANTIX STARTING MONTH PAK 0.5 MG X 11 & 1 MG X 42 PO TABS
ORAL_TABLET | ORAL | 2 refills | Status: DC
Start: 2019-10-13 — End: 2019-10-14

## 2019-10-13 MED ORDER — METFORMIN HCL ER 500 MG PO TB24: ORAL_TABLET | ORAL | 1 refills | Status: DC

## 2019-10-13 NOTE — Telephone Encounter (Signed)
Catie:   I gave him a  Chantix prescription to try. I also gave him  Metformin XR 500 mg BID.    Please call and discuss with him:   Please tell family,  that you are starting Chantix so they can not only support you, but make sure you do not show any unusual thoughts, or  Behavior of self harm- suicide- this is rare however I want you to be vigilant.   Make a plan to slowly decrease smoking as you are on the chantix until your quit date. ( see below for detailed instructions).    Be mindful of  common side effects- mainly GI upset, so please TAKE with FOOD. You may also have strange dreams, however this too is less common.   Select a quit date within 7 days of starting Chantix. Goal is you should stop smoking within 8 to 35 days of starting chantix  Initial: Days 1 to 3: 0.5 mg by mouth once daily Days 4 to 7: 0.5 mg by mouth twice daily   Maintenance (? Day 8): 1 mg by mouth twice daily for 11 weeks. We may consider a temporary or permanent dose reduction if usual dose of 1 mg twice per day is not tolerated.   Slow decrease of smoking:   If you are not able or willing to quit abruptly, begin treatment with vareniciline ( Chantix) and reduce smoking by 50% from baseline within the first 4 weeks, by an additional 50% in the next 4 weeks, and continue reducing with the goal of complete abstinence by 12 weeks.   If successfully quits smoking at the end of the 12 weeks, may continue for another 12 weeks to help maintain success. If you are motivated to quit and do not succeed in stopping smoking during prior therapy, or relapse after treatment, I would encourage you to make another attempt with varenicline ( Chantix) once factors contributing to the failed attempt have been identified and addressed.

## 2019-10-14 ENCOUNTER — Ambulatory Visit: Payer: Medicare HMO | Admitting: Pharmacist

## 2019-10-14 DIAGNOSIS — J449 Chronic obstructive pulmonary disease, unspecified: Secondary | ICD-10-CM | POA: Diagnosis not present

## 2019-10-14 DIAGNOSIS — E785 Hyperlipidemia, unspecified: Secondary | ICD-10-CM | POA: Diagnosis not present

## 2019-10-14 DIAGNOSIS — E119 Type 2 diabetes mellitus without complications: Secondary | ICD-10-CM

## 2019-10-14 DIAGNOSIS — Z720 Tobacco use: Secondary | ICD-10-CM

## 2019-10-14 MED ORDER — VARENICLINE TARTRATE 1 MG PO TABS: 1.0000 mg | ORAL_TABLET | Freq: Two times a day (BID) | ORAL | 3 refills | Status: DC

## 2019-10-14 NOTE — Chronic Care Management (AMB) (Signed)
Chronic Care Management   Follow Up Note   10/14/2019 Name: Benjamin Conley MRN: [id number] DOB: [birth year]  Referred by: [name], NP Reason for referral : Chronic Care Management (Medication Management)   Benjamin Conley is a 54 y.o. year old male who is a primary care patient of [name], NP. The CCM team was consulted for assistance with chronic disease management and care coordination needs.    Contacted patient to follow up on medication changes recommended at last appt.   Review of patient status, including review of consultants reports, relevant laboratory and other test results, and collaboration with appropriate care team members and the patient's provider was performed as part of comprehensive patient evaluation and provision of chronic care management services.    SDOH (Social Determinants of Health) assessments performed: Yes See Care Plan activities for detailed interventions related to [facility])     Outpatient Encounter Medications as of 10/14/2019  Medication Sig  . albuterol (VENTOLIN HFA) 108 (90 Base) MCG/ACT inhaler Inhale 2 puffs into the lungs every 6 (six) hours as needed for wheezing or shortness of breath.  [name] aspirin EC 81 MG EC tablet Take 1 tablet (81 mg total) by mouth daily.  [name] atorvastatin (LIPITOR) 40 MG tablet Take 1 tablet (40 mg total) by mouth daily.  . clopidogrel (PLAVIX) 75 MG tablet Take 2 tablets (150 mg total) by mouth daily.  [name] glucose blood (RELION TRUE METRIX TEST STRIPS) test strip Use as instructed to check blood sugars twice daily. Dx E11.9  . insulin glargine (LANTUS) 100 UNIT/ML Solostar Pen Inject 20 Units into the skin at bedtime.  . metFORMIN (GLUCOPHAGE-XR) 500 MG 24 hr tablet Take 1 pill with breakfast and 1 pill with supper every day.  . metoprolol succinate (TOPROL XL) 25 MG 24 hr tablet Take 1 tablet (25 mg total) by mouth daily.  [name] ofloxacin (OCUFLOX) 0.3 % ophthalmic solution ofloxacin 0.3 % eye drops  INSTILL  1 DROP INTO LEFT EYE 4 TIMES DAILY (Patient not taking: Reported on 10/04/2019)  . varenicline (APO-VARENICLINE) 1 MG tablet Take 1 tablet (1 mg total) by mouth 2 (two) times daily. Take 0.5 mg daily x 3 days, then 0.5 mg BID x 4 days, then 1 mg BID thereafter  . [DISCONTINUED] varenicline (CHANTIX STARTING MONTH PAK) 0.5 MG X 11 & 1 MG X 42 tablet Take one 0.5 mg tablet by mouth once daily for 3 days, then increase to one 0.5 mg tablet twice daily for 4 days, then increase to one 1 mg tablet twice daily.   No facility-administered encounter medications on file as of 10/14/2019.     Objective:   Goals Addressed              This Visit's Progress     Patient Stated   .  PharmD "I want to stay healthy" (pt-stated)        CARE PLAN ENTRY (see longitudinal plan of care for additional care plan information)  Current Barriers:  . Diabetes: uncontrolled; complicated by chronic medical conditions including splenic infarct , most recent A1c 11.3% . Most recent eGFR: >60 mL/min . Current antihyperglycemic regimen: metformin 500 mg BID, Lantus 20 units daily - metformin XR 500 mg BID just prescribed by PPC . Current meal patterns: o Drinks: was drinking 24 pack of pepsi daily, now has cut back to a 24 pack lasting a week. Down to ~5 drinks per day. No sugar added juice, sometimes flavored water  .  Cardiovascular risk reduction: o Current hypertensive regimen: metoprolol succinate 25 mg daily  o Current hyperlipidemia regimen: atorvastatin 40 mg daily;  o Current antiplatelet regimen: ASA 81 mg daily + clopidogrel 150 mg daily (s/p splenic infarction) . Tobacco Abuse: smoking 1.5 ppd, started at age 75. Quit up to a year before, but restarted after Chantix was discontinued. Notes that he has lots of worries recently about his health, and uses cigarettes to deal with the worries. Is amenable to re-starting Chantix at this time. Starting Chantix script now.  Pharmacist Clinical Goal(s):  [name] Over the  next 90 days, patient will work with PharmD and primary care provider to address optimized medication management  Interventions: . Comprehensive medication review performed, medication list updated in electronic medical record . Inter-disciplinary care team collaboration (see longitudinal plan of care) . Contacted pharmacy to discuss back order. They cannot order varenicline from [facility] at this time, but can order apo-varenicline. Per permission from PCP, changed script to apo-varenicline and sent for patient.  . Contacted patient, counseled on varenicline as per myChart message documentation from PCP. Counseled on change to metformin ER as well.  Patient Self Care Activities:  . Patient will check blood glucose BID, document, and provide at future appointments . Patient will take medications as prescribed . Patient will report any questions or concerns to provider   Please see past updates related to this goal by clicking on the "Past Updates" button in the selected goal          Plan:  - Will outreach as previously scheduled  Catie Darnelle Maffucci, PharmD, [place], [place] Pharmacist [place] [phone]

## 2019-10-14 NOTE — Patient Instructions (Signed)
Visit Information  Goals Addressed              This Visit's Progress     Patient Stated   .  PharmD "I want to stay healthy" (pt-stated)        CARE PLAN ENTRY (see longitudinal plan of care for additional care plan information)  Current Barriers:  . Diabetes: uncontrolled; complicated by chronic medical conditions including splenic infarct , most recent A1c 11.3% . Most recent eGFR: >60 mL/min . Current antihyperglycemic regimen: metformin 500 mg BID, Lantus 20 units daily - metformin XR 500 mg BID just prescribed by [facility] . Current meal patterns: o Drinks: was drinking 24 pack of pepsi daily, now has cut back to a 24 pack lasting a week. Down to ~5 drinks per day. No sugar added juice, sometimes flavored water  . Cardiovascular risk reduction: o Current hypertensive regimen: metoprolol succinate 25 mg daily  o Current hyperlipidemia regimen: atorvastatin 40 mg daily;  o Current antiplatelet regimen: ASA 81 mg daily + clopidogrel 150 mg daily (s/p splenic infarction) . Tobacco Abuse: smoking 1.5 ppd, started at age 10. Quit up to a year before, but restarted after Chantix was discontinued. Notes that he has lots of worries recently about his health, and uses cigarettes to deal with the worries. Is amenable to re-starting Chantix at this time. Starting Chantix script now.  Pharmacist Clinical Goal(s):  [name] Over the next 90 days, patient will work with PharmD and primary care provider to address optimized medication management  Interventions: . Comprehensive medication review performed, medication list updated in electronic medical record . Inter-disciplinary care team collaboration (see longitudinal plan of care) . Contacted pharmacy to discuss back order. They cannot order varenicline from [facility] at this time, but can order apo-varenicline. Per permission from PCP, changed script to apo-varenicline and sent for patient.  . Contacted patient, counseled on varenicline as per myChart  message documentation from PCP. Counseled on change to metformin ER as well.  Patient Self Care Activities:  . Patient will check blood glucose BID, document, and provide at future appointments . Patient will take medications as prescribed . Patient will report any questions or concerns to provider   Please see past updates related to this goal by clicking on the "Past Updates" button in the selected goal         The patient verbalized understanding of instructions provided today and declined a print copy of patient instruction materials.   Plan:  - Will outreach as previously scheduled  Catie Darnelle Maffucci, PharmD, [place], [place] Pharmacist [place] [phone]

## 2019-10-18 ENCOUNTER — Telehealth: Payer: Self-pay | Admitting: Nurse Practitioner

## 2019-10-18 NOTE — Telephone Encounter (Signed)
Pt wanted a called back about his meter

## 2019-10-18 NOTE — Telephone Encounter (Signed)
Spoke with patient and he is having trouble getting his test strips filled at [facility]. I advised patient I will call the pharmacy and see what can be done to get his strips filled before he runs out.

## 2019-10-19 ENCOUNTER — Ambulatory Visit (INDEPENDENT_AMBULATORY_CARE_PROVIDER_SITE_OTHER): Payer: Medicare HMO

## 2019-10-19 VITALS — Ht 64.0 in | Wt 178.0 lb

## 2019-10-19 DIAGNOSIS — Z0000 Encounter for general adult medical examination without abnormal findings: Secondary | ICD-10-CM | POA: Diagnosis not present

## 2019-10-19 NOTE — Progress Notes (Signed)
Subjective:   Benjamin Conley is a 54 y.o. male who presents for an Initial Medicare Annual Wellness Visit.  Review of Systems    No ROS.  Medicare Wellness Virtual Visit.   Cardiac Risk Factors include: advanced age (>61mn, >>35women);diabetes mellitus;hypertension;male gender;smoking/ tobacco exposure     Objective:    Today's Vitals   10/19/19 0834  Weight: 178 lb (80.7 kg)  Height: _0  (1.626 m)   Body mass index is 30.55 kg/m.  Advanced Directives 10/19/2019 09/23/2019 09/17/2019 08/29/2019 08/27/2019 08/21/2019 08/01/2019  Does Patient Have a Medical Advance Directive? Yes _1  No  Type of Advance Directive Living will - - - - - -  Does patient want to make changes to medical advance directive? No - Patient declined - - - - - -  Would patient like information on creating a medical advance directive? - - No - Patient declined No - Patient declined No - Patient declined No - Patient declined No - Patient declined    Current Medications (verified) Outpatient Encounter Medications as of 10/19/2019  Medication Sig  . albuterol (VENTOLIN HFA) 108 (90 Base) MCG/ACT inhaler Inhale 2 puffs into the lungs every 6 (six) hours as needed for wheezing or shortness of breath.  .Marland Kitchenaspirin EC 81 MG EC tablet Take 1 tablet (81 mg total) by mouth daily.  .Marland Kitchenatorvastatin (LIPITOR) 40 MG tablet Take 1 tablet (40 mg total) by mouth daily.  . clopidogrel (PLAVIX) 75 MG tablet Take 2 tablets (150 mg total) by mouth daily.  .Marland Kitchenglucose blood (RELION TRUE METRIX TEST STRIPS) test strip Use as instructed to check blood sugars twice daily. Dx E11.9  . insulin glargine (LANTUS) 100 UNIT/ML Solostar Pen Inject 20 Units into the skin at bedtime.  . metFORMIN (GLUCOPHAGE-XR) 500 MG 24 hr tablet Take 1 pill with breakfast and 1 pill with supper every day.  . metoprolol succinate (TOPROL XL) 25 MG 24 hr tablet Take 1 tablet (25 mg total) by mouth daily.  .Marland Kitchenofloxacin (OCUFLOX) 0.3 % ophthalmic  solution ofloxacin 0.3 % eye drops  INSTILL 1 DROP INTO LEFT EYE 4 TIMES DAILY (Patient not taking: Reported on 10/04/2019)  . varenicline (APO-VARENICLINE) 1 MG tablet Take 1 tablet (1 mg total) by mouth 2 (two) times daily. Take 0.5 mg daily x 3 days, then 0.5 mg BID x 4 days, then 1 mg BID thereafter   No facility-administered encounter medications on file as of 10/19/2019.    Allergies (verified) Wasp venom, Wasp venom protein, Coffee flavor, and Onion   History: Past Medical History:  Diagnosis Date  . Complication of anesthesia    c/o difficulty breathing after anesthesia  . COPD (chronic obstructive pulmonary disease) ([place]   . Diabetes mellitus without complication ([place]   . Hyperlipemia   . 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 Tobacco abuse    Past Surgical History:  Procedure Laterality Date  . BACK SURGERY     lumbar  . COLONOSCOPY WITH PROPOFOL N/A 08/24/2019   Procedure: COLONOSCOPY WITH PROPOFOL;  Surgeon: WLucilla Lame MD;  [facility];  Service: Endoscopy;  Laterality: N/A;  . ESOPHAGOGASTRODUODENOSCOPY (EGD) WITH PROPOFOL N/A 08/24/2019   Procedure: ESOPHAGOGASTRODUODENOSCOPY (EGD) WITH PROPOFOL;  Surgeon: WLucilla Lame MD;  [facility];  Service: Endoscopy;  Laterality: N/A;  . STENT PLACEMENT VASCULAR ([place])  07/2019   stenosis of distal splenic artery and stent placed  . VISCERAL ANGIOGRAPHY N/A 08/06/2019   Procedure: VISCERAL ANGIOGRAPHY;  Surgeon: [name], MD;  Location: [facility];  Service: Cardiovascular;  Laterality: N/A;   Family History  Problem Relation Age of Onset  . Diabetes Mellitus II Mother   . Diabetes Mellitus II Brother   . Hyperlipidemia Brother   . Colon cancer Maternal Aunt   . Colon cancer Maternal Uncle    Social History   Socioeconomic History  . Marital status: Single    Spouse name: Not on file  . Number of children: Not on file  . Years of education: Not on file  . Highest education  level: Not on file  Occupational History  . Not on file  Tobacco Use  . Smoking status: Current Every Day Smoker    Packs/day: 2.00    Years: 48.00    Pack years: 96.00    Types: Cigarettes  . Smokeless tobacco: Never Used  Vaping Use  . Vaping Use: Never used  Substance and Sexual Activity  . Alcohol use: No    Comment: Never been a problem  . Drug use: No  . Sexual activity: Yes  Other Topics Concern  . Not on file  Social History Narrative   Single, lives alone, unemployed.   Social Determinants of Health   Financial Resource Strain:   . Difficulty of Paying Living Expenses:   Food Insecurity: No Food Insecurity  . Worried About Charity fundraiser in the Last Year: Never true  . Ran Out of Food in the Last Year: Never true  Transportation Needs: No Transportation Needs  . Lack of Transportation (Medical): No  . Lack of Transportation (Non-Medical): No  Physical Activity:   . Days of Exercise per Week:   . Minutes of Exercise per Session:   Stress: Stress Concern Present  . Feeling of Stress : Very much  Social Connections:   . Frequency of Communication with Friends and Family:   . Frequency of Social Gatherings with Friends and Family:   . Attends Religious Services:   . Active Member of Clubs or Organizations:   . Attends Archivist Meetings:   [name] Marital Status:     Tobacco Counseling Ready to quit: Not Answered Counseling given: Not Answered   Clinical Intake:  Pre-visit preparation completed: Yes        Diabetes: Yes (Followed by PCP)  How often do you need to have someone help you when you read instructions, pamphlets, or other written materials from your doctor or pharmacy?: 1 - Never Interpreter Needed?: No      Activities of Daily Living In your present state of health, do you have any difficulty performing the following activities: 10/19/2019 08/22/2019  Hearing? Y N  Comment Hearing aids -  Vision? N N  Difficulty  concentrating or making decisions? N N  Walking or climbing stairs? N Y  Dressing or bathing? N N  Doing errands, shopping? N N  Preparing Food and eating ? N -  Using the Toilet? N -  In the past six months, have you accidently leaked urine? Y -  Do you have problems with loss of bowel control? N -  Managing your Medications? N -  Managing your Finances? N -  Housekeeping or managing your Housekeeping? N -  Some recent data might be hidden    Patient Care Team: [name], NP as PCP - General (Nurse Practitioner) [name], MD as PCP - Cardiology (Cardiology) [name], [facility] as Pharmacist (Pharmacist) Lloyd Huger, MD  as Consulting Physician (Oncology)  Indicate any recent [place] you may have received from other than [facility] providers in the past year (date may be approximate).     Assessment:   This is a routine wellness examination for Chancy.  I connected with Patryck today by telephone and verified that I am speaking with the correct person using two identifiers. Location patient: home Location provider: work Persons participating in the virtual visit: patient, Marine scientist.    I discussed the limitations, risks, security and privacy concerns of performing an evaluation and management service by telephone and the availability of in person appointments. The patient expressed understanding and verbally consented to this telephonic visit.    Interactive audio and video telecommunications were attempted between this provider and patient, however failed, due to patient having technical difficulties OR patient did not have access to video capability.  We continued and completed visit with audio only.  Some vital signs may be absent or patient reported.   Hearing/Vision screen  Hearing Screening   _0  _1  _2  _3  _4  _5  _6  _7  _8   Right ear:           Left ear:           Comments: Hearing aid  Vision Screening  Comments: Followed by [facility] Wears corrective lenses No retinopathy reported Visual acuity not assessed, virtual visit.  They have seen their ophthalmologist in the last 12 months.     Dietary issues and exercise activities discussed: Current Exercise Habits: Home exercise routine, Type of exercise: walking, Intensity: Mild  Goals      Patient Stated   .  I need to drink more water (pt-stated)    .  PharmD "I want to stay healthy" (pt-stated)      CARE PLAN ENTRY (see longitudinal plan of care for additional care plan information)  Current Barriers:  . Diabetes: uncontrolled; complicated by chronic medical conditions including splenic infarct , most recent A1c 11.3% . Most recent eGFR: >60 mL/min . Current antihyperglycemic regimen: metformin 500 mg BID, Lantus 20 units daily - metformin XR 500 mg BID just prescribed by [facility] . Current meal patterns: o Drinks: was drinking 24 pack of pepsi daily, now has cut back to a 24 pack lasting a week. Down to ~5 drinks per day. No sugar added juice, sometimes flavored water  . Cardiovascular risk reduction: o Current hypertensive regimen: metoprolol succinate 25 mg daily  o Current hyperlipidemia regimen: atorvastatin 40 mg daily;  o Current antiplatelet regimen: ASA 81 mg daily + clopidogrel 150 mg daily (s/p splenic infarction) . Tobacco Abuse: smoking 1.5 ppd, started at age 61. Quit up to a year before, but restarted after Chantix was discontinued. Notes that he has lots of worries recently about his health, and uses cigarettes to deal with the worries. Is amenable to re-starting Chantix at this time. Starting Chantix script now.  Pharmacist Clinical Goal(s):  [name] Over the next 90 days, patient will work with PharmD and primary care provider to address optimized medication management  Interventions: . Comprehensive medication review performed, medication list updated in electronic medical record . Inter-disciplinary care team  collaboration (see longitudinal plan of care) . Contacted pharmacy to discuss back order. They cannot order varenicline from [facility] at this time, but can order apo-varenicline. Per permission from PCP, changed script to apo-varenicline and sent for patient.  . Contacted patient, counseled on varenicline as per myChart message documentation from PCP. Counseled on change to metformin ER as well.  Patient Self Care Activities:  . Patient will check blood glucose BID, document, and provide at future appointments . Patient will take medications as prescribed . Patient will report any questions or concerns to provider   Please see past updates related to this goal by clicking on the "Past Updates" button in the selected goal        Depression Screen PHQ 2/9 Scores 10/19/2019 08/19/2019 08/11/2019  PHQ - 2 Score 0 0 1  PHQ- 9 Score - 3 9    Fall Risk Fall Risk  10/19/2019 08/19/2019  Falls in the past year? 0 0  Number falls in past yr: 0 0  Injury with Fall? - 0  Follow up Falls evaluation completed Falls evaluation completed   Handrails in use when climbing stairs? Yes  Home free of loose throw rugs in walkways, pet beds, electrical cords, etc? Yes  Adequate lighting in your home to reduce risk of falls? Yes   ASSISTIVE DEVICES UTILIZED TO PREVENT FALLS: Use of a cane, walker or w/c? Yes , cane  Grab bars in the bathroom? No  Shower chair or bench in shower? No  Elevated toilet seat or a handicapped toilet? No   TIMED UP AND GO:  Was the test performed? No . Virtual visit.   Cognitive Function:     6CIT Screen 10/19/2019  What Year? 0 points  What month? 0 points  Months in reverse 4 points    Immunizations Immunization History  Administered Date(s) Administered  . Td 03/11/2004  . Tdap 08/02/2014   Health Maintenance Health Maintenance  Topic Date Due  . FOOT EXAM  Never done  . COVID-19 Vaccine (1) 11/04/2019 (Originally 10/20/1977)  . INFLUENZA VACCINE  06/08/2020  (Originally 10/10/2019)  . PNEUMOCOCCAL POLYSACCHARIDE VACCINE AGE 45-64 HIGH RISK  10/18/2020 (Originally 10/21/1967)  . Hepatitis C Screening  10/18/2020 (Originally 06-Dec-1965)  . OPHTHALMOLOGY EXAM  11/10/2019  . HEMOGLOBIN A1C  02/01/2020  . URINE MICROALBUMIN  08/12/2020  . TETANUS/TDAP  08/01/2024  . COLONOSCOPY  08/23/2029  . HIV Screening  Completed    Hepatitis C Screening deferred per patient preference.   Dental Screening: Recommended annual dental exams for proper oral hygiene.   Community Resource Referral / Chronic Care Management: CRR required this visit?  No   CCM required this visit?  No      Plan:   Keep all routine maintenance appointments.   Follow up 10/21/19 @ 1:30  Follow up 11/09/19 @ 10:45  I have personally reviewed and noted the following in the patient's chart:   . Medical and social history . Use of alcohol, tobacco or illicit drugs  . Current medications and supplements . Functional ability and status . Nutritional status . Physical activity . Advanced directives . List of other physicians . Hospitalizations, surgeries, and ER visits in previous 12 months . Vitals . Screenings to include cognitive, depression, and falls . Referrals and appointments  In addition, I have reviewed and discussed with patient certain preventive protocols, quality metrics, and best practice recommendations. A written personalized care plan for preventive services as well as general preventive health recommendations were provided to patient via mychart.     Varney Biles, LPN   4/49/2010

## 2019-10-19 NOTE — Patient Instructions (Addendum)
Benjamin Conley , Thank you for taking time to come for your Medicare Wellness Visit. I appreciate your ongoing commitment to your health goals. Please review the following plan we discussed and let me know if I can assist you in the future.   These are the goals we discussed: Goals      Patient Stated   .  I need to drink more water (pt-stated)    .  PharmD "I want to stay healthy" (pt-stated)      CARE PLAN ENTRY (see longitudinal plan of care for additional care plan information)  Current Barriers:  . Diabetes: uncontrolled; complicated by chronic medical conditions including splenic infarct , most recent A1c 11.3% . Most recent eGFR: >60 mL/min . Current antihyperglycemic regimen: metformin 500 mg BID, Lantus 20 units daily - metformin XR 500 mg BID just prescribed by [facility] . Current meal patterns: o Drinks: was drinking 24 pack of pepsi daily, now has cut back to a 24 pack lasting a week. Down to ~5 drinks per day. No sugar added juice, sometimes flavored water  . Cardiovascular risk reduction: o Current hypertensive regimen: metoprolol succinate 25 mg daily  o Current hyperlipidemia regimen: atorvastatin 40 mg daily;  o Current antiplatelet regimen: ASA 81 mg daily + clopidogrel 150 mg daily (s/p splenic infarction) . Tobacco Abuse: smoking 1.5 ppd, started at age 64. Quit up to a year before, but restarted after Chantix was discontinued. Notes that he has lots of worries recently about his health, and uses cigarettes to deal with the worries. Is amenable to re-starting Chantix at this time. Starting Chantix script now.  Pharmacist Clinical Goal(s):  [name] Over the next 90 days, patient will work with PharmD and primary care provider to address optimized medication management  Interventions: . Comprehensive medication review performed, medication list updated in electronic medical record . Inter-disciplinary care team collaboration (see longitudinal plan of care) . Contacted pharmacy to  discuss back order. They cannot order varenicline from [place] at this time, but can order apo-varenicline. Per permission from PCP, changed script to apo-varenicline and sent for patient.  . Contacted patient, counseled on varenicline as per myChart message documentation from PCP. Counseled on change to metformin ER as well.  Patient Self Care Activities:  . Patient will check blood glucose BID, document, and provide at future appointments . Patient will take medications as prescribed . Patient will report any questions or concerns to provider   Please see past updates related to this goal by clicking on the "Past Updates" button in the selected goal         This is a list of the screening recommended for you and due dates:  Health Maintenance  Topic Date Due  . Complete foot exam   Never done  . COVID-19 Vaccine (1) 11/04/2019*  . Flu Shot  06/08/2020*  . Pneumococcal vaccine  10/18/2020*  .  Hepatitis C: One time screening is recommended by Center for Disease Control  (CDC) for  adults born from [birth year] through 1965.   10/18/2020*  . Eye exam for diabetics  11/10/2019  . Hemoglobin A1C  02/01/2020  . Urine Protein Check  08/12/2020  . Tetanus Vaccine  08/01/2024  . Colon Cancer Screening  08/23/2029  . HIV Screening  Completed  *Topic was postponed. The date shown is not the original due date.    Immunizations Immunization History  Administered Date(s) Administered  . Td 03/11/2004  . Tdap 08/02/2014   Keep all routine maintenance appointments.  Follow up 10/21/19 @ 1:30  Follow up 11/09/19 @ 10:45  Advanced directives: End of life planning; Advance aging; Advanced directives discussed.  Copy of current HCPOA/Living Will requested.    Follow up in one year for your annual wellness visit.  Preventive Care 40-64 Years, Male Preventive care refers to lifestyle choices and visits with your health care provider that can promote health and wellness. What does preventive care  include?  A yearly physical exam. This is also called an annual well check.  Dental exams once or twice a year.  Routine eye exams. Ask your health care provider how often you should have your eyes checked.  Personal lifestyle choices, including:  Daily care of your teeth and gums.  Regular physical activity.  Eating a healthy diet.  Avoiding tobacco and drug use.  Limiting alcohol use.  Practicing safe sex.  Taking low-dose aspirin every day starting at age 69. What happens during an annual well check? The services and screenings done by your health care provider during your annual well check will depend on your age, overall health, lifestyle risk factors, and family history of disease. Counseling  Your health care provider may ask you questions about your:  Alcohol use.  Tobacco use.  Drug use.  Emotional well-being.  Home and relationship well-being.  Sexual activity.  Eating habits.  Work and work Statistician. Screening  You may have the following tests or measurements:  Height, weight, and BMI.  Blood pressure.  Lipid and cholesterol levels. These may be checked every 5 years, or more frequently if you are over 50 years old.  Skin check.  Lung cancer screening. You may have this screening every year starting at age 44 if you have a 30-pack-year history of smoking and currently smoke or have quit within the past 15 years.  Fecal occult blood test (FOBT) of the stool. You may have this test every year starting at age 13.  Flexible sigmoidoscopy or colonoscopy. You may have a sigmoidoscopy every 5 years or a colonoscopy every 10 years starting at age 77.  Prostate cancer screening. Recommendations will vary depending on your family history and other risks.  Hepatitis C blood test.  Hepatitis B blood test.  Sexually transmitted disease (STD) testing.  Diabetes screening. This is done by checking your blood sugar (glucose) after you have not eaten  for a while (fasting). You may have this done every 1-3 years. Discuss your test results, treatment options, and if necessary, the need for more tests with your health care provider. Vaccines  Your health care provider may recommend certain vaccines, such as:  Influenza vaccine. This is recommended every year.  Tetanus, diphtheria, and acellular pertussis (Tdap, Td) vaccine. You may need a Td booster every 10 years.  Zoster vaccine. You may need this after age 64.  Pneumococcal 13-valent conjugate (PCV13) vaccine. You may need this if you have certain conditions and have not been vaccinated.  Pneumococcal polysaccharide (PPSV23) vaccine. You may need one or two doses if you smoke cigarettes or if you have certain conditions. Talk to your health care provider about which screenings and vaccines you need and how often you need them. This information is not intended to replace advice given to you by your health care provider. Make sure you discuss any questions you have with your health care provider. Document Released: 03/24/2015 Document Revised: 11/15/2015 Document Reviewed: 12/27/2014 Elsevier Interactive Patient Education  2017 [place] Prevention in the Home Falls can cause injuries.  They can happen to people of all ages. There are many things you can do to make your home safe and to help prevent falls. What can I do on the outside of my home?  Regularly fix the edges of walkways and driveways and fix any cracks.  Remove anything that might make you trip as you walk through a door, such as a raised step or threshold.  Trim any bushes or trees on the path to your home.  Use bright outdoor lighting.  Clear any walking paths of anything that might make someone trip, such as rocks or tools.  Regularly check to see if handrails are loose or broken. Make sure that both sides of any steps have handrails.  Any raised decks and porches should have guardrails on the  edges.  Have any leaves, snow, or ice cleared regularly.  Use sand or salt on walking paths during winter.  Clean up any spills in your garage right away. This includes oil or grease spills. What can I do in the bathroom?  Use night lights.  Install grab bars by the toilet and in the tub and shower. Do not use towel bars as grab bars.  Use non-skid mats or decals in the tub or shower.  If you need to sit down in the shower, use a plastic, non-slip stool.  Keep the floor dry. Clean up any water that spills on the floor as soon as it happens.  Remove soap buildup in the tub or shower regularly.  Attach bath mats securely with double-sided non-slip rug tape.  Do not have throw rugs and other things on the floor that can make you trip. What can I do in the bedroom?  Use night lights.  Make sure that you have a light by your bed that is easy to reach.  Do not use any sheets or blankets that are too big for your bed. They should not hang down onto the floor.  Have a firm chair that has side arms. You can use this for support while you get dressed.  Do not have throw rugs and other things on the floor that can make you trip. What can I do in the kitchen?  Clean up any spills right away.  Avoid walking on wet floors.  Keep items that you use a lot in easy-to-reach places.  If you need to reach something above you, use a strong step stool that has a grab bar.  Keep electrical cords out of the way.  Do not use floor polish or wax that makes floors slippery. If you must use wax, use non-skid floor wax.  Do not have throw rugs and other things on the floor that can make you trip. What can I do with my stairs?  Do not leave any items on the stairs.  Make sure that there are handrails on both sides of the stairs and use them. Fix handrails that are broken or loose. Make sure that handrails are as long as the stairways.  Check any carpeting to make sure that it is firmly  attached to the stairs. Fix any carpet that is loose or worn.  Avoid having throw rugs at the top or bottom of the stairs. If you do have throw rugs, attach them to the floor with carpet tape.  Make sure that you have a light switch at the top of the stairs and the bottom of the stairs. If you do not have them, ask someone to add them  for you. What else can I do to help prevent falls?  Wear shoes that:  Do not have high heels.  Have rubber bottoms.  Are comfortable and fit you well.  Are closed at the toe. Do not wear sandals.  If you use a stepladder:  Make sure that it is fully opened. Do not climb a closed stepladder.  Make sure that both sides of the stepladder are locked into place.  Ask someone to hold it for you, if possible.  Clearly mark and make sure that you can see:  Any grab bars or handrails.  First and last steps.  Where the edge of each step is.  Use tools that help you move around (mobility aids) if they are needed. These include:  Canes.  Walkers.  Scooters.  Crutches.  Turn on the lights when you go into a dark area. Replace any light bulbs as soon as they burn out.  Set up your furniture so you have a clear path. Avoid moving your furniture around.  If any of your floors are uneven, fix them.  If there are any pets around you, be aware of where they are.  Review your medicines with your doctor. Some medicines can make you feel dizzy. This can increase your chance of falling. Ask your doctor what other things that you can do to help prevent falls. This information is not intended to replace advice given to you by your health care provider. Make sure you discuss any questions you have with your health care provider. Document Released: 12/22/2008 Document Revised: 08/03/2015 Document Reviewed: 04/01/2014 Elsevier Interactive Patient Education  2017 [employer].

## 2019-10-20 ENCOUNTER — Encounter (INDEPENDENT_AMBULATORY_CARE_PROVIDER_SITE_OTHER): Payer: Self-pay | Admitting: Nurse Practitioner

## 2019-10-20 ENCOUNTER — Ambulatory Visit (INDEPENDENT_AMBULATORY_CARE_PROVIDER_SITE_OTHER): Payer: Medicare HMO | Admitting: Nurse Practitioner

## 2019-10-20 ENCOUNTER — Ambulatory Visit (INDEPENDENT_AMBULATORY_CARE_PROVIDER_SITE_OTHER): Payer: Medicare HMO

## 2019-10-20 ENCOUNTER — Other Ambulatory Visit: Payer: Self-pay

## 2019-10-20 VITALS — BP 106/72 | HR 88 | Ht 64.0 in | Wt 182.0 lb

## 2019-10-20 DIAGNOSIS — I70223 Atherosclerosis of native arteries of extremities with rest pain, bilateral legs: Secondary | ICD-10-CM | POA: Diagnosis not present

## 2019-10-20 DIAGNOSIS — M79604 Pain in right leg: Secondary | ICD-10-CM | POA: Diagnosis not present

## 2019-10-20 DIAGNOSIS — M79605 Pain in left leg: Secondary | ICD-10-CM | POA: Diagnosis not present

## 2019-10-20 DIAGNOSIS — Z720 Tobacco use: Secondary | ICD-10-CM

## 2019-10-20 DIAGNOSIS — I748 Embolism and thrombosis of other arteries: Secondary | ICD-10-CM

## 2019-10-20 DIAGNOSIS — I10 Essential (primary) hypertension: Secondary | ICD-10-CM | POA: Diagnosis not present

## 2019-10-21 ENCOUNTER — Encounter (INDEPENDENT_AMBULATORY_CARE_PROVIDER_SITE_OTHER): Payer: Self-pay

## 2019-10-21 ENCOUNTER — Encounter: Payer: Self-pay | Admitting: Nurse Practitioner

## 2019-10-21 ENCOUNTER — Ambulatory Visit (INDEPENDENT_AMBULATORY_CARE_PROVIDER_SITE_OTHER): Payer: Medicare HMO | Admitting: Nurse Practitioner

## 2019-10-21 VITALS — BP 118/68 | HR 68 | Temp 98.3°F | Ht 64.0 in | Wt 182.0 lb

## 2019-10-21 DIAGNOSIS — G479 Sleep disorder, unspecified: Secondary | ICD-10-CM

## 2019-10-21 DIAGNOSIS — I10 Essential (primary) hypertension: Secondary | ICD-10-CM

## 2019-10-21 DIAGNOSIS — D62 Acute posthemorrhagic anemia: Secondary | ICD-10-CM

## 2019-10-21 DIAGNOSIS — E119 Type 2 diabetes mellitus without complications: Secondary | ICD-10-CM

## 2019-10-21 DIAGNOSIS — Z720 Tobacco use: Secondary | ICD-10-CM | POA: Diagnosis not present

## 2019-10-21 DIAGNOSIS — D75839 Thrombocytosis, unspecified: Secondary | ICD-10-CM

## 2019-10-21 DIAGNOSIS — E785 Hyperlipidemia, unspecified: Secondary | ICD-10-CM | POA: Diagnosis not present

## 2019-10-21 DIAGNOSIS — R198 Other specified symptoms and signs involving the digestive system and abdomen: Secondary | ICD-10-CM | POA: Insufficient documentation

## 2019-10-21 DIAGNOSIS — K6289 Other specified diseases of anus and rectum: Secondary | ICD-10-CM | POA: Diagnosis not present

## 2019-10-21 DIAGNOSIS — N5319 Other ejaculatory dysfunction: Secondary | ICD-10-CM | POA: Diagnosis not present

## 2019-10-21 DIAGNOSIS — E538 Deficiency of other specified B group vitamins: Secondary | ICD-10-CM | POA: Diagnosis not present

## 2019-10-21 DIAGNOSIS — D473 Essential (hemorrhagic) thrombocythemia: Secondary | ICD-10-CM

## 2019-10-21 DIAGNOSIS — J449 Chronic obstructive pulmonary disease, unspecified: Secondary | ICD-10-CM

## 2019-10-21 DIAGNOSIS — R9089 Other abnormal findings on diagnostic imaging of central nervous system: Secondary | ICD-10-CM

## 2019-10-21 DIAGNOSIS — Z95828 Presence of other vascular implants and grafts: Secondary | ICD-10-CM

## 2019-10-21 DIAGNOSIS — D500 Iron deficiency anemia secondary to blood loss (chronic): Secondary | ICD-10-CM

## 2019-10-21 NOTE — Progress Notes (Signed)
Established Patient Office Visit  Subjective:  Patient ID: Benjamin Conley, male    DOB: March 15, 1965  Age: 54 y.o. MRN: [id number]  CC:  Chief Complaint  Patient presents with  . Follow-up    HPI:  He is a  54 year old patient with complex PMH listed below returns for routine follow-up office visit.  In addition to multiple chronic medical conditions listed below, he has several new complaints today:  1. Concern about his prostate and feels rectal discomfort and  inability to ejaculate normally.  2. Felt a gum lump and reports he was  seen in Acute care and told no infection and he has a dentist appt 11/26/19 and has plans to have his teeth removed.  3. He wants to discuss and make himself a DNR after reading through the [employer].   T2DM: Uncontrolled with most recent A1c 11.3%,. Goal A1C<7.  estimated GFR >60, taking his Metformin XR 500 mg twice daily-no diarrhea with the XR. Lantus 20 units in the a.m.  He has cut out a lot of his sugar drinks, juices, and is trying to change his diet.  He is making progress.  He is being followed by chronic care management Catie is following with phone calls and diabetes education.  He reports fasting blood sugar readings: FBS  168 and  2 PP  139 6/30 FBS 113, 2-hour postprandial 189 7 /6 FBS 136 , 2 hour PP 226   Podiatry: He need special shoes ordered by Podiatry for deformity and diabetes.  He has tingling and foot pain in both feet, likely neuropathy. His toes get puffy and red at night.   Essential HTN/TACHYCARDIA: At goal He is  taking metoprolol succinate 25 mg daily. BP Readings from Last 3 Encounters:  10/21/19 118/68  10/20/19 106/72  10/04/19 100/68    Pulse Readings from Last 3 Encounters:  10/21/19 68  10/20/19 88  10/04/19 99   Hyperlipidemia: He is on atorvastatin 40 mg day. Lab Results  Component Value Date   CHOL 130 10/21/2019   HDL 35.60 (L) 10/21/2019   LDLCALC 66 10/21/2019   TRIG 142.0 10/21/2019   CHOLHDL  4 10/21/2019    Status post splenic infarction with stent: He remains on aspirin 81 mg daily, Plavix 150 mg daily. Saw Vascular: yesterday: [place] on spleen 10/01/19 and [place] on legs yesterday and no blood clots or plaque build up. everything is clear- no further tests planned per his report. To f/up in 3 mos.  Anemia/elevated PLTS: Followed by hematology and sees Dr. Grayland Ormond for IV iron transfusions.  CBC is improving and is being monitored by hematology- next OV in Oct.  COPD/ Tobacco abuse: He has been smoking since age 39 yo -all of his life. Smokes 1.5 ppd  and started Chantix 4 days ago  He and his  brother roll her own cigarettes and they have a machine in the living room to do this.  His brother is thinking about quitting as well.    He was given albuterol inhaler and he has used it a few times for cough. He took Mucinex in the hospital. No allergies or sinus sx now. Now, his breathing is good. No wheezing or tightness or SOB . He does get DOE with walking uphill or strenuous work. This is unchanged x years.  No CP, pressure, tightness. He has a Pulmonology appt coming up in near future. Needs OSA testing and remote CPAP use.   Tinnitus and noise sensitivity: He  saw ENT and has hearing loss, appt upcoming to get fitted for hearing aides.   Dizziness/HA/ABNL MRI/ Hx of CPAP-not suing:  : He saw Neurologist- Dr Melrose Nakayama 09/22/2019. Carotid US recommend- not ordered.   Declines vaccines- refuses flu and Covid or Shingles   He wants to discuss DNR and MPOA.   Past Medical History:  Diagnosis Date  . Complication of anesthesia    c/o difficulty breathing after anesthesia  . COPD (chronic obstructive pulmonary disease) ([place])   . Diabetes mellitus without complication ([place])   . Hyperlipemia   . Melena 08/21/2019  . Paroxysmal supraventricular tachycardia ([place]) 08/19/2019  . Postural dizziness with presyncope 08/21/2019  . 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 Tobacco abuse   . Upper GI bleed 08/21/2019     Past Surgical History:  Procedure Laterality Date  . BACK SURGERY     lumbar  . COLONOSCOPY WITH PROPOFOL N/A 08/24/2019   Procedure: COLONOSCOPY WITH PROPOFOL;  Surgeon: [name], MD;  [facility];  Service: Endoscopy;  Laterality: N/A;  . ESOPHAGOGASTRODUODENOSCOPY (EGD) WITH PROPOFOL N/A 08/24/2019   Procedure: ESOPHAGOGASTRODUODENOSCOPY (EGD) WITH PROPOFOL;  Surgeon: [name], MD;  Location: [facility] ENDOSCOPY;  Service: Endoscopy;  Laterality: N/A;  . STENT PLACEMENT VASCULAR ([place] [facility])  07/2019   stenosis of distal splenic artery and stent placed  . VISCERAL ANGIOGRAPHY N/A 08/06/2019   Procedure: VISCERAL ANGIOGRAPHY;  Surgeon: [name], MD;  Location: [place] CV LAB;  Service: Cardiovascular;  Laterality: N/A;    Family History  Problem Relation Age of Onset  . Diabetes Mellitus II Mother   . Diabetes Mellitus II Brother   . Hyperlipidemia Brother   . Colon cancer Maternal Aunt   . Colon cancer Maternal Uncle     Social History   Socioeconomic History  . Marital status: Single    Spouse name: Not on file  . Number of children: Not on file  . Years of education: Not on file  . Highest education level: Not on file  Occupational History  . Not on file  Tobacco Use  . Smoking status: Current Every Day Smoker    Packs/day: 2.00    Years: 48.00    Pack years: 96.00    Types: Cigarettes  . Smokeless tobacco: Never Used  Vaping Use  . Vaping Use: Never used  Substance and Sexual Activity  . Alcohol use: No    Comment: Never been a problem  . Drug use: No  . Sexual activity: Yes  Other Topics Concern  . Not on file  Social History Narrative   Single, lives alone, unemployed.   Social Determinants of Health   Financial Resource Strain:   . Difficulty of Paying Living Expenses:   Food Insecurity: No Food Insecurity  . Worried About Charity fundraiser in the Last Year: Never true  . Ran Out of Food in the Last Year: Never true   Transportation Needs: No Transportation Needs  . Lack of Transportation (Medical): No  . Lack of Transportation (Non-Medical): No  Physical Activity:   . Days of Exercise per Week:   . Minutes of Exercise per Session:   Stress: Stress Concern Present  . Feeling of Stress : Very much  Social Connections:   . Frequency of Communication with Friends and Family:   . Frequency of Social Gatherings with Friends and Family:   . Attends Religious Services:   . Active Member of Clubs or Organizations:   . Attends Archivist  Meetings:   [name] Marital Status:   Intimate Partner Violence: Not At Risk  . Fear of Current or Ex-Partner: No  . Emotionally Abused: No  . Physically Abused: No  . Sexually Abused: No    Outpatient Medications Prior to Visit  Medication Sig Dispense Refill  . albuterol (VENTOLIN HFA) 108 (90 Base) MCG/ACT inhaler Inhale 2 puffs into the lungs every 6 (six) hours as needed for wheezing or shortness of breath. 6.7 g 3  . aspirin EC 81 MG EC tablet Take 1 tablet (81 mg total) by mouth daily. 90 tablet 1  . atorvastatin (LIPITOR) 40 MG tablet Take 1 tablet (40 mg total) by mouth daily. 90 tablet 1  . clopidogrel (PLAVIX) 75 MG tablet Take 2 tablets (150 mg total) by mouth daily. 90 tablet 1  . glucose blood (RELION TRUE METRIX TEST STRIPS) test strip Use as instructed to check blood sugars twice daily. Dx E11.9 200 each 12  . insulin glargine (LANTUS) 100 UNIT/ML Solostar Pen Inject 20 Units into the skin at bedtime. 15 mL 11  . metFORMIN (GLUCOPHAGE-XR) 500 MG 24 hr tablet Take 1 pill with breakfast and 1 pill with supper every day. 60 tablet 1  . metoprolol succinate (TOPROL XL) 25 MG 24 hr tablet Take 1 tablet (25 mg total) by mouth daily. 90 tablet 3  . ofloxacin (OCUFLOX) 0.3 % ophthalmic solution ofloxacin 0.3 % eye drops  INSTILL 1 DROP INTO LEFT EYE 4 TIMES DAILY    . varenicline (APO-VARENICLINE) 1 MG tablet Take 1 tablet (1 mg total) by mouth 2 (two) times  daily. Take 0.5 mg daily x 3 days, then 0.5 mg BID x 4 days, then 1 mg BID thereafter 56 tablet 3   No facility-administered medications prior to visit.    Allergies  Allergen Reactions  . Wasp Venom Anaphylaxis  . Wasp Venom Protein Anaphylaxis  . Coffee Flavor Nausea And Vomiting    Patient states coffee gives him nausea and stomach cramps  . Onion     Review of Systems Pertinent positives noted in history of present illness otherwise negative.   Objective:    Physical Exam Vitals reviewed.  Constitutional:      Appearance: Normal appearance.  HENT:     Head: Normocephalic.  Cardiovascular:     Rate and Rhythm: Normal rate and regular rhythm.     Heart sounds: Normal heart sounds.  Pulmonary:     Effort: Pulmonary effort is normal.     Breath sounds: Normal breath sounds.  Abdominal:     Palpations: Abdomen is soft.     Tenderness: There is no abdominal tenderness.  Musculoskeletal:        General: Normal range of motion.  Skin:    General: Skin is warm and dry.  Neurological:     General: No focal deficit present.     Mental Status: He is oriented to person, place, and time.  Psychiatric:        Mood and Affect: Mood normal.        Behavior: Behavior normal.     BP 118/68 (BP Location: Left Arm, Patient Position: Sitting, Cuff Size: Normal)   Pulse 68   Temp 98.3 F (36.8 C) (Oral)   Ht 5\' 4"  (1.626 m)   Wt 182 lb (82.6 kg)   SpO2 99%   BMI 31.24 kg/m  Wt Readings from Last 3 Encounters:  10/21/19 182 lb (82.6 kg)  10/20/19 182 lb (82.6 kg)  10/19/19  178 lb (80.7 kg)     Health Maintenance Due  Topic Date Due  . FOOT EXAM  Never done    There are no preventive care reminders to display for this patient.  Lab Results  Component Value Date   TSH 1.68 08/20/2019   Lab Results  Component Value Date   WBC 9.7 09/23/2019   HGB 11.4 (L) 09/23/2019   HCT 34.6 (L) 09/23/2019   MCV 88.9 09/23/2019   PLT 756 (H) 09/23/2019   Lab Results    Component Value Date   NA 140 10/21/2019   K 4.0 10/21/2019   CO2 30 10/21/2019   GLUCOSE 148 (H) 10/21/2019   BUN 11 10/21/2019   CREATININE 0.70 10/21/2019   BILITOT 0.3 09/06/2019   ALKPHOS 79 09/06/2019   AST 11 09/06/2019   ALT 17 09/06/2019   PROT 7.4 09/06/2019   ALBUMIN 4.5 09/06/2019   CALCIUM 9.4 10/21/2019   ANIONGAP 12 09/23/2019   GFR 117.52 10/21/2019   Lab Results  Component Value Date   CHOL 130 10/21/2019   Lab Results  Component Value Date   HDL 35.60 (L) 10/21/2019   Lab Results  Component Value Date   LDLCALC 66 10/21/2019   Lab Results  Component Value Date   TRIG 142.0 10/21/2019   Lab Results  Component Value Date   CHOLHDL 4 10/21/2019   Lab Results  Component Value Date   HGBA1C 7.5 (H) 10/21/2019      Assessment & Plan:   Problem List Items Addressed This Visit      Respiratory   COPD with chronic bronchitis ([place])    He was given albuterol inhaler and he has used it a few times for cough. He took Mucinex in the hospital. No allergies or sinus sx now. Now, his breathing is good. No wheezing or tightness or SOB . He does get DOE with walking uphill or strenuous work. This is unchanged x years.  No CP, pressure, tightness. He has a Pulmonology appt coming up in near future.   Needs OSA testing and remote CPAP use.         Endocrine   Diabetes mellitus without complication ([place]) - Primary   Relevant Orders   Hemoglobin A1c (Completed)   Basic metabolic panel (Completed)     Hematopoietic and Hemostatic   Thrombocytosis ([place])     Followed by hematology and sees Dr. Grayland Ormond - next OV in Oct.Thrombocytosis: Secondary to iron deficiency.  IV iron as above.  JAK2 mutation is negative.         Other   Hyperlipemia   Relevant Orders   Lipid Profile (Completed)   Tobacco abuse    Smoker since age 12 yo -all of his life. Smokes 1.5 ppd  and started Chantix 4 days ago  He and his  brother roll her own cigarettes and they have a  machine in the living room to do this. 5 minutes spent with patient today on tobacco cessation counseling.         Presence of arterial stent - splenic artery    Followed by Dr. [name]: He remains on aspirin 81 mg daily, Plavix 150 mg daily. Saw Vascular: yesterday: [place] on spleen 10/01/19 and [place] on legs yesterday and no blood clots or plaque build up."Everything is clear- no further tests planned"  per his report. To f/up in 3 mos.       Acute posthemorrhagic anemia    IV iron and followed by [facility]-  next appt OCT      Abnormal brain MRI    He saw Dr. Melrose Nakayama in Neurology to discuss MRI results. Advised to stop smoking and obtain carotid US. See vascular annually.       Difficulty sleeping    Remote use CPAP- needs re tested. Sees PULM       Abnormal ejaculation    Referral to Urology. Reports change in ability to ejaculate. No blood in semen.  PSA 2. 21- no comparison.       Relevant Orders   PSA (Completed)   Urinalysis, Routine w reflex microscopic (Completed)   Urine Culture (Completed)   B12 deficiency     B12 deficiency:  Had first  1000 mcg intramuscular B12 by Hematology and  will receive a second dose at his appointment in 3 months (OCT).  Step up B12 replacement weekly x 4 and then monthly and may help with feet neuropathy.       Relevant Orders   B12 and Folate Panel (Completed)   Rectal pain   Relevant Orders   PSA (Completed)      No orders of the defined types were placed in this encounter.  Lab work is ordered for you today.  We will call you with further recommendations.  Please make a dedicated appt to talk abut DNR status as several paper to complete.   Continue with current therapy.  Will await A1c and if not at goal, we will make adjustments in your diabetes medication.  Continue to monitor your blood sugar.  See if you can cut back on the fruit juices as we discussed today.  Great job for starting Chantix, continue to follow-up.  The goal is to  decrease your tobacco use and eventually come off of it when you are on Chantix medication Common side effects including rare risk of suicide ideation was discussed with the patient today.  Patient is instructed to go directly to the ED if this occurs.  We discussed that patient can continue to smoke for 1 week after starting chantix, but then must discontinue cigarettes.Please see the pulmonary doctor as planned.  I have filled out the paperwork to have the therapeutic shoes as ordered by your podiatrist.  Lab addendum: Problems with ejaculation, rectal pressure ( colonoscopy in June 2021 so no concerns about rectal malignancy) , high normal PSA- no comparison.    Follow-up: Return in about 3 months (around 01/21/2020).  This visit occurred during the SARS-CoV-2 public health emergency.  Safety protocols were in place, including screening questions prior to the visit, additional usage of staff PPE, and extensive cleaning of exam room while observing appropriate contact time as indicated for disinfecting solutions.    [name], NP

## 2019-10-21 NOTE — Telephone Encounter (Signed)
Patient will be picking up strips this afternoon

## 2019-10-21 NOTE — Patient Instructions (Addendum)
Lab work is ordered for you today.  We will call you with further recommendations.  Continue with current therapy.  Will await A1c and if not at goal, we will make adjustments in your diabetes medication.  Continue to monitor your blood sugar.  See if you can cut back on the fruit juices as we discussed today.  Great job for starting Chantix, continue to follow-up.  The goal is to decrease your tobacco use and eventually come off of it when you are on Chantix medication. Common side effects including rare risk of suicide ideation was discussed with the patient today.  Patient is instructed to go directly to the ED if this occurs. Please see the pulmonary doctor as planned.  I have filled out the paperwork to have the therapeutic shoes as ordered by your podiatrist.    I will check with the clinic and try to direct you to how you can complete your DNR request.    Smoking Tobacco Information, Adult Smoking tobacco can be harmful to your health. Tobacco contains a poisonous (toxic), colorless chemical called nicotine. Nicotine is addictive. It changes the brain and can make it hard to stop smoking. Tobacco also has other toxic chemicals that can hurt your body and raise your risk of many cancers. How can smoking tobacco affect me? Smoking tobacco puts you at risk for:  Cancer. Smoking is most commonly associated with lung cancer, but can also lead to cancer in other parts of the body.  Chronic obstructive pulmonary disease (COPD). This is a long-term lung condition that makes it hard to breathe. It also gets worse over time.  High blood pressure (hypertension), heart disease, stroke, or heart attack.  Lung infections, such as pneumonia.  Cataracts. This is when the lenses in the eyes become clouded.  Digestive problems. This may include peptic ulcers, heartburn, and gastroesophageal reflux disease (GERD).  Oral health problems, such as gum disease and tooth loss.  Loss of taste and  smell. Smoking can affect your appearance by causing:  Wrinkles.  Yellow or stained teeth, fingers, and fingernails. Smoking tobacco can also affect your social life, because:  It may be challenging to find places to smoke when away from home. Many workplaces, [employer], hotels, and public places are tobacco-free.  Smoking is expensive. This is due to the cost of tobacco and the long-term costs of treating health problems from smoking.  Secondhand smoke may affect those around you. Secondhand smoke can cause lung cancer, breathing problems, and heart disease. Children of smokers have a higher risk for: ? Sudden infant death syndrome (SIDS). ? Ear infections. ? Lung infections. If you currently smoke tobacco, quitting now can help you:  Lead a longer and healthier life.  Look, smell, breathe, and feel better over time.  Save money.  Protect others from the harms of secondhand smoke. What actions can I take to prevent health problems? Quit smoking   Do not start smoking. Quit if you already do.  Make a plan to quit smoking and commit to it. Look for programs to help you and ask your health care provider for recommendations and ideas.  Set a date and write down all the reasons you want to quit.  Let your friends and family know you are quitting so they can help and support you. Consider finding friends who also want to quit. It can be easier to quit with someone else, so that you can support each other.  Talk with your health care provider about  using nicotine replacement medicines to help you quit, such as gum, lozenges, patches, sprays, or pills.  Do not replace cigarette smoking with electronic cigarettes, which are commonly called e-cigarettes. The safety of e-cigarettes is not known, and some may contain harmful chemicals.  If you try to quit but return to smoking, stay positive. It is common to slip up when you first quit, so take it one day at a time.  Be prepared for  cravings. When you feel the urge to smoke, chew gum or suck on hard candy. Lifestyle  Stay busy and take care of your body.  Drink enough fluid to keep your urine pale yellow.  Get plenty of exercise and eat a healthy diet. This can help prevent weight gain after quitting.  Monitor your eating habits. Quitting smoking can cause you to have a larger appetite than when you smoke.  Find ways to relax. Go out with friends or family to a movie or a restaurant where people do not smoke.  Ask your health care provider about having regular tests (screenings) to check for cancer. This may include blood tests, imaging tests, and other tests.  Find ways to manage your stress, such as meditation, yoga, or exercise. Where to find support To get support to quit smoking, consider:  Asking your health care provider for more information and resources.  Taking classes to learn more about quitting smoking.  Looking for local organizations that offer resources about quitting smoking.  Joining a support group for people who want to quit smoking in your local community.  Calling the smokefree.gov counselor helpline: 1-800-Quit-Now 250-680-1005) Where to find more information You may find more information about quitting smoking from:  HelpGuide.org: www.helpguide.org  https://hall.com/: smokefree.gov  American Lung Association: www.lung.org Contact a health care provider if you:  Have problems breathing.  Notice that your lips, nose, or fingers turn blue.  Have chest pain.  Are coughing up blood.  Feel faint or you pass out.  Have other health changes that cause you to worry. Summary  Smoking tobacco can negatively affect your health, the health of those around you, your finances, and your social life.  Do not start smoking. Quit if you already do. If you need help quitting, ask your health care provider.  Think about joining a support group for people who want to quit smoking in your  local community. There are many effective programs that will help you to quit this behavior. This information is not intended to replace advice given to you by your health care provider. Make sure you discuss any questions you have with your health care provider. Document Revised: 11/20/2018 Document Reviewed: 03/12/2016 Elsevier Patient Education  2020 [employer].

## 2019-10-21 NOTE — Telephone Encounter (Signed)
Received notification from Vascular Surgery provider regarding recommendations for Plavix. Per their instruction:  "We haven't received anything about clearance, however Mr.Virag had a splenic artery stent placed on 08/06/2019. Because of this, he needs to remain on plavix for at least 90 days, so until 11/06/2019. At that point he can stop the plavix for 5 days prior. "  Will route to his dental office to make them aware.   Benjamin Dubonnet, NP

## 2019-10-22 LAB — URINALYSIS, ROUTINE W REFLEX MICROSCOPIC
Bilirubin Urine: NEGATIVE
Hgb urine dipstick: NEGATIVE
Ketones, ur: NEGATIVE
Leukocytes,Ua: NEGATIVE
Nitrite: NEGATIVE
RBC / HPF: NONE SEEN (ref 0–?)
Specific Gravity, Urine: 1.015 (ref 1.000–1.030)
Total Protein, Urine: NEGATIVE
Urine Glucose: 250 — AB
Urobilinogen, UA: 1 (ref 0.0–1.0)
pH: 7.5 (ref 5.0–8.0)

## 2019-10-22 LAB — URINE CULTURE
MICRO NUMBER:: 10819486
Result:: NO GROWTH
SPECIMEN QUALITY:: ADEQUATE

## 2019-10-22 LAB — B12 AND FOLATE PANEL
Folate: 10.2 ng/mL (ref >5.9)
Vitamin B-12: 140 pg/mL — ABNORMAL LOW (ref 211–911)

## 2019-10-22 LAB — BASIC METABOLIC PANEL
BUN: 11 mg/dL (ref 6–23)
CO2: 30 mEq/L (ref 19–32)
Calcium: 9.4 mg/dL (ref 8.4–10.5)
Chloride: 103 mEq/L (ref 96–112)
Creatinine, Ser: 0.7 mg/dL (ref 0.40–1.50)
GFR: 117.52 mL/min (ref >60.00)
Glucose, Bld: 148 mg/dL — ABNORMAL HIGH (ref 70–99)
Potassium: 4 mEq/L (ref 3.5–5.1)
Sodium: 140 mEq/L (ref 135–145)

## 2019-10-22 LAB — PSA: PSA: 2.21 ng/mL (ref 0.10–4.00)

## 2019-10-22 LAB — LIPID PANEL
Cholesterol: 130 mg/dL (ref 0–200)
HDL: 35.6 mg/dL — ABNORMAL LOW (ref >39.00)
LDL Cholesterol: 66 mg/dL (ref 0–99)
NonHDL: 94.8
Total CHOL/HDL Ratio: 4
Triglycerides: 142 mg/dL (ref 0.0–149.0)
VLDL: 28.4 mg/dL (ref 0.0–40.0)

## 2019-10-22 LAB — HEMOGLOBIN A1C: Hgb A1c MFr Bld: 7.5 % — ABNORMAL HIGH (ref 4.6–6.5)

## 2019-10-24 ENCOUNTER — Encounter: Payer: Self-pay | Admitting: Nurse Practitioner

## 2019-10-24 NOTE — Assessment & Plan Note (Signed)
Referral to Urology. Reports change in ability to ejaculate. No blood in semen.  PSA 2. 21- no comparison.

## 2019-10-24 NOTE — Assessment & Plan Note (Addendum)
GI bldg resolved.   08/2019: EGD: 2 non bldg duodenum AVMs found and TX with coagulation.   08/2019: Colonoscopy showed 3 non bleeding AVMs, treated. Non adenomatous colon polyp repeat colonoscopy in 10 years.  He stopped his Protonix after admission. No GERD sx. On ASA 81 mg and Plavix. Check with GI regarding need to continue PPI  for prevention.

## 2019-10-24 NOTE — Assessment & Plan Note (Addendum)
Improved. etiology could be tinnitus and post hemorrhagic anemia. Brain MRI did not explain.

## 2019-10-24 NOTE — Assessment & Plan Note (Addendum)
He was given albuterol inhaler and he has used it a few times for cough. He took Mucinex in the hospital. No allergies or sinus sx now. Now, his breathing is good. No wheezing or tightness or SOB . He does get DOE with walking uphill or strenuous work. This is unchanged x years.  No CP, pressure, tightness. He has a Pulmonology appt coming up in near future.   Needs OSA testing and remote CPAP use.

## 2019-10-24 NOTE — Assessment & Plan Note (Signed)
Remote use CPAP- needs re tested. Sees PULM

## 2019-10-24 NOTE — Assessment & Plan Note (Addendum)
He saw Dr. Melrose Nakayama in Neurology to discuss MRI results. Advised to stop smoking and obtain carotid US. See vascular annually.

## 2019-10-24 NOTE — Assessment & Plan Note (Addendum)
Followed by hematology and sees Dr. Grayland Ormond - next OV in Oct.Thrombocytosis: Secondary to iron deficiency.  IV iron as above.  JAK2 mutation is negative.

## 2019-10-24 NOTE — Assessment & Plan Note (Addendum)
Smoker since age 54 yo -all of his life. Smokes 1.5 ppd  and started Chantix 4 days ago  He and his  brother roll her own cigarettes and they have a machine in the living room to do this. 5 minutes spent with patient today on tobacco cessation counseling.

## 2019-10-24 NOTE — Assessment & Plan Note (Addendum)
B12 deficiency:  Had first  1000 mcg intramuscular B12 by Hematology and  will receive a second dose at his appointment in 3 months (OCT).  Step up B12 replacement weekly x 4 and then monthly and may help with feet neuropathy.

## 2019-10-24 NOTE — Assessment & Plan Note (Signed)
Followed by Dr. [name]: He remains on aspirin 81 mg daily, Plavix 150 mg daily. Saw Vascular: yesterday: [place] on spleen 10/01/19 and [place] on legs yesterday and no blood clots or plaque build up."Everything is clear- no further tests planned"  per his report. To f/up in 3 mos.

## 2019-10-24 NOTE — Assessment & Plan Note (Addendum)
He has had melena, and in June 2021 acute hemorrhagic anemia with AVMs found on EGD and colonoscopy. No UGI c/o and per GI does not need to stay on a PPI.   His hemoglobin has improved.  He does still have significant iron deficiency has been given IV iron followed by Dr. Grayland Ormond in hematology. He is not on oral iron.  He also received 1 IM injection of B12. He is not on oral B12.  Repeat follow-up with Dr. Grayland Ormond in October. PLAN : Begin OTC oral iron in between IV iron if tolerated. He may do better with Vitron  C one daily - will ask him to start.

## 2019-10-24 NOTE — Assessment & Plan Note (Signed)
IV iron and followed by [facility]- next appt OCT

## 2019-10-24 NOTE — Assessment & Plan Note (Signed)
Tinnitus, hearing loss,  followed ENT. Plans to get hearing aids.

## 2019-10-25 ENCOUNTER — Encounter (INDEPENDENT_AMBULATORY_CARE_PROVIDER_SITE_OTHER): Payer: Self-pay | Admitting: Nurse Practitioner

## 2019-10-25 MED ORDER — VITRON-C 65-125 MG PO TABS: 1.0000 | ORAL_TABLET | Freq: Every day | ORAL | 0 refills | Status: DC

## 2019-10-25 NOTE — Addendum Note (Signed)
Addended by: [name] A on: 10/25/2019 08:34 PM   Modules accepted: Orders

## 2019-10-25 NOTE — Progress Notes (Signed)
Subjective:    Patient ID: Benjamin Conley, male    DOB: [birth year], 54 y.o.   MRN: [id number] Chief Complaint  Patient presents with  . Follow-up    U/S follow up    Patient returns to [place] today for follow-up of noninvasive studies related to lower extremity pain and worsening neuropathy.  Patient notes worsening claudication distance, as well as numbness and burning in his left lower extremity.  The patient denies any rest pain like symptoms.  He does endorse a shooting pain in his left foot that tends to happen more so at night or when he is resting.  He also notes that at times his feet swell and his toes turn red.  It is also worth noting that the patient does have 2 rods in his back.  Today noninvasive studies show an ABI of 1.18 on the right and 1.23 on the left.  Patient has strong toe digits bilaterally.  The patient also underwent a bilateral lower extremity arterial duplex which showed triphasic waveforms from the level of the mid common femoral artery down to the distal tibial arteries.   Review of Systems  Musculoskeletal: Positive for gait problem and myalgias.  All other systems reviewed and are negative.      Objective:   Physical Exam Vitals reviewed.  HENT:     Head: Normocephalic.  Cardiovascular:     Rate and Rhythm: Normal rate and regular rhythm.     Pulses: Normal pulses.  Pulmonary:     Effort: Pulmonary effort is normal.  Skin:    General: Skin is warm and dry.  Neurological:     Mental Status: He is alert and oriented to person, place, and time.  Psychiatric:        Mood and Affect: Mood normal.        Behavior: Behavior normal.        Thought Content: Thought content normal.        Judgment: Judgment normal.     BP 106/72   Pulse 88   Ht 5\' 4"  (1.626 m)   Wt 182 lb (82.6 kg)   BMI 31.24 kg/m   Past Medical History:  Diagnosis Date  . Acute posthemorrhagic anemia   . Complication of anesthesia    c/o difficulty breathing after  anesthesia  . COPD (chronic obstructive pulmonary disease) ([place])   . Diabetes mellitus without complication ([place])   . Hyperlipemia   . Melena 08/21/2019  . Paroxysmal supraventricular tachycardia ([place]) 08/19/2019  . Postural dizziness with presyncope 08/21/2019  . 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 Tobacco abuse   . Upper GI bleed 08/21/2019    Social History   Socioeconomic History  . Marital status: Single    Spouse name: Not on file  . Number of children: Not on file  . Years of education: Not on file  . Highest education level: Not on file  Occupational History  . Not on file  Tobacco Use  . Smoking status: Current Every Day Smoker    Packs/day: 2.00    Years: 48.00    Pack years: 96.00    Types: Cigarettes  . Smokeless tobacco: Never Used  Vaping Use  . Vaping Use: Never used  Substance and Sexual Activity  . Alcohol use: No    Comment: Never been a problem  . Drug use: No  . Sexual activity: Yes  Other Topics Concern  . Not on file  Social History Narrative   Single, lives alone,  unemployed.   Social Determinants of Health   Financial Resource Strain:   . Difficulty of Paying Living Expenses:   Food Insecurity: No Food Insecurity  . Worried About Charity fundraiser in the Last Year: Never true  . Ran Out of Food in the Last Year: Never true  Transportation Needs: No Transportation Needs  . Lack of Transportation (Medical): No  . Lack of Transportation (Non-Medical): No  Physical Activity:   . Days of Exercise per Week:   . Minutes of Exercise per Session:   Stress: Stress Concern Present  . Feeling of Stress : Very much  Social Connections:   . Frequency of Communication with Friends and Family:   . Frequency of Social Gatherings with Friends and Family:   . Attends Religious Services:   . Active Member of Clubs or Organizations:   . Attends Archivist Meetings:   [name] Marital Status:   Intimate Partner Violence: Not At Risk  . Fear of Current or  Ex-Partner: No  . Emotionally Abused: No  . Physically Abused: No  . Sexually Abused: No    Past Surgical History:  Procedure Laterality Date  . BACK SURGERY     lumbar  . COLONOSCOPY WITH PROPOFOL N/A 08/24/2019   Procedure: COLONOSCOPY WITH PROPOFOL;  Surgeon: [name], MD;  Location[facility] END[facility]COPY;  Service: Endoscopy;  Laterality: N/A;  . ESOPHAGOGASTRODUODENOSCOPY (EGD) WITH PROPOFOL N/A 08/24/2019   Procedure: ESOPHAGOGASTRODUODENOSCOPY (EGD) WITH PROPOFOL;  Surgeon: [name], MD;  Location: [facility] ENDOSCOPY;  Service: Endoscopy;  Laterality: N/A;  . STENT PLACEMENT VASCULAR ([place] [facility])  07/2019   stenosis of distal splenic artery and stent placed  . VISCERAL ANGIOGRAPHY N/A 08/06/2019   Procedure: VISCERAL ANGIOGRAPHY;  Surgeon: [name], MD;  Location: [place] CV LAB;  Service: Cardiovascular;  Laterality: N/A;    Family History  Problem Relation Age of Onset  . Diabetes Mellitus II Mother   . Diabetes Mellitus II Brother   . Hyperlipidemia Brother   . Colon cancer Maternal Aunt   . Colon cancer Maternal Uncle     Allergies  Allergen Reactions  . Wasp Venom Anaphylaxis  . Wasp Venom Protein Anaphylaxis  . Coffee Flavor Nausea And Vomiting    Patient states coffee gives him nausea and stomach cramps  . Onion        Assessment & Plan:   1. Thrombosis of splenic artery (HCC) Patient continues to have no issues with abdominal pain.  Patient will continue with anticoagulation at this time.  No medication changes made.  2. Pain in both lower extremities Recommend:  I do not find evidence of Vascular pathology that would explain the patient's symptoms  The patient has atypical pain symptoms for vascular disease  I do not find evidence of Vascular pathology that would explain the patient's symptoms and I suspect the patient is c/o pseudoclaudication.  Patient should have an evaluation of his LS spine which I defer to the primary  service.  Noninvasive studies including  of the legs do not identify vascular problems  The patient should continue walking and begin a more formal exercise program. The patient should continue his antiplatelet therapy and aggressive treatment of the lipid abnormalities. The patient should begin wearing graduated compression socks 15-20 mmHg strength to control her mild edema.  Patient will follow-up with me on a PRN basis  Further work-up of her lower extremity pain is deferred to the primary service     3. Essential  hypertension Continue antihypertensive medications as already ordered, these medications have been reviewed and there are no changes at this time.   4. Tobacco abuse Smoking cessation was discussed, 3-10 minutes spent on this topic specifically    Current Outpatient Medications on File Prior to Visit  Medication Sig Dispense Refill  . aspirin EC 81 MG EC tablet Take 1 tablet (81 mg total) by mouth daily. 90 tablet 1  . atorvastatin (LIPITOR) 40 MG tablet Take 1 tablet (40 mg total) by mouth daily. 90 tablet 1  . clopidogrel (PLAVIX) 75 MG tablet Take 2 tablets (150 mg total) by mouth daily. 90 tablet 1  . glucose blood (RELION TRUE METRIX TEST STRIPS) test strip Use as instructed to check blood sugars twice daily. Dx E11.9 200 each 12  . insulin glargine (LANTUS) 100 UNIT/ML Solostar Pen Inject 20 Units into the skin at bedtime. 15 mL 11  . metFORMIN (GLUCOPHAGE-XR) 500 MG 24 hr tablet Take 1 pill with breakfast and 1 pill with supper every day. 60 tablet 1  . varenicline (APO-VARENICLINE) 1 MG tablet Take 1 tablet (1 mg total) by mouth 2 (two) times daily. Take 0.5 mg daily x 3 days, then 0.5 mg BID x 4 days, then 1 mg BID thereafter 56 tablet 3  . albuterol (VENTOLIN HFA) 108 (90 Base) MCG/ACT inhaler Inhale 2 puffs into the lungs every 6 (six) hours as needed for wheezing or shortness of breath. 6.7 g 3  . metoprolol succinate (TOPROL XL) 25 MG 24 hr tablet Take 1  tablet (25 mg total) by mouth daily. 90 tablet 3  . ofloxacin (OCUFLOX) 0.3 % ophthalmic solution ofloxacin 0.3 % eye drops  INSTILL 1 DROP INTO LEFT EYE 4 TIMES DAILY     No current facility-administered medications on file prior to visit.    There are no Patient Instructions on file for this visit. No follow-ups on file.   [name], NP

## 2019-10-26 DIAGNOSIS — R42 Dizziness and giddiness: Secondary | ICD-10-CM | POA: Diagnosis not present

## 2019-10-26 DIAGNOSIS — I6523 Occlusion and stenosis of bilateral carotid arteries: Secondary | ICD-10-CM | POA: Diagnosis not present

## 2019-10-28 ENCOUNTER — Encounter: Payer: Self-pay | Admitting: Urology

## 2019-10-28 ENCOUNTER — Ambulatory Visit (INDEPENDENT_AMBULATORY_CARE_PROVIDER_SITE_OTHER): Payer: Medicare HMO | Admitting: Urology

## 2019-10-28 ENCOUNTER — Other Ambulatory Visit: Payer: Self-pay

## 2019-10-28 VITALS — BP 113/81 | HR 105 | Ht 64.0 in | Wt 181.0 lb

## 2019-10-28 DIAGNOSIS — N3943 Post-void dribbling: Secondary | ICD-10-CM | POA: Diagnosis not present

## 2019-10-28 DIAGNOSIS — N5319 Other ejaculatory dysfunction: Secondary | ICD-10-CM | POA: Diagnosis not present

## 2019-10-28 DIAGNOSIS — R198 Other specified symptoms and signs involving the digestive system and abdomen: Secondary | ICD-10-CM | POA: Diagnosis not present

## 2019-10-28 DIAGNOSIS — N401 Enlarged prostate with lower urinary tract symptoms: Secondary | ICD-10-CM | POA: Diagnosis not present

## 2019-10-30 ENCOUNTER — Encounter: Payer: Self-pay | Admitting: Urology

## 2019-10-30 DIAGNOSIS — N401 Enlarged prostate with lower urinary tract symptoms: Secondary | ICD-10-CM | POA: Insufficient documentation

## 2019-10-30 DIAGNOSIS — R3911 Hesitancy of micturition: Secondary | ICD-10-CM | POA: Insufficient documentation

## 2019-10-30 MED ORDER — TAMSULOSIN HCL 0.4 MG PO CAPS
0.4000 mg | ORAL_CAPSULE | Freq: Every day | ORAL | 1 refills | Status: DC
Start: 2019-10-30 — End: 2020-01-16

## 2019-10-30 NOTE — Progress Notes (Signed)
10/28/2019 3:48 PM   Benjamin Conley 1965/08/18 [id number]  Referring provider: [name], NP [address] [place],  [place] [address]  Chief Complaint  Patient presents with  . Other    HPI: Benjamin Conley is a 54 y.o. male seen at the request of Benjamin Paradise, NP for evaluation of rectal pain and abnormal ejaculation.   Several week history rectal pressure/discomfort   Complains of some decrease stream and postvoid dribbling  Has been unable to ejaculate  Denies dysuria, gross hematuria  No flank, abdominal or pelvic pain  No prior history urologic problems  No SSRI medication  PSA 10/21/2019 was 2.21  PMH: Past Medical History:  Diagnosis Date  . Acute posthemorrhagic anemia   . Complication of anesthesia    c/o difficulty breathing after anesthesia  . COPD (chronic obstructive pulmonary disease) ([place])   . Diabetes mellitus without complication ([place])   . Hyperlipemia   . Melena 08/21/2019  . Paroxysmal supraventricular tachycardia ([place]) 08/19/2019  . Postural dizziness with presyncope 08/21/2019  . 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 Tobacco abuse   . Upper GI bleed 08/21/2019    Surgical History: Past Surgical History:  Procedure Laterality Date  . BACK SURGERY     lumbar  . COLONOSCOPY WITH PROPOFOL N/A 08/24/2019   Procedure: COLONOSCOPY WITH PROPOFOL;  Surgeon: [name], MD;  Location: [facility] ENDOSCOPY;  Service: Endoscopy;  Laterality: N/A;  . ESOPHAGOGASTRODUODENOSCOPY (EGD) WITH PROPOFOL N/A 08/24/2019   Procedure: ESOPHAGOGASTRODUODENOSCOPY (EGD) WITH PROPOFOL;  Surgeon: [name], MD;  Location: [facility] ENDOSCOPY;  Service: Endoscopy;  Laterality: N/A;  . STENT PLACEMENT VASCULAR ([place] [facility])  07/2019   stenosis of distal splenic artery and stent placed  . VISCERAL ANGIOGRAPHY N/A 08/06/2019   Procedure: VISCERAL ANGIOGRAPHY;  Surgeon: [name], MD;  Location: [place] CV LAB;  Service: Cardiovascular;   Laterality: N/A;    Home Medications:  Allergies as of 10/28/2019      Reactions   Wasp Venom Anaphylaxis   Wasp Venom Protein Anaphylaxis   Coffee Flavor Nausea And Vomiting   Patient states coffee gives him nausea and stomach cramps   Onion       Medication List       Accurate as of October 28, 2019 11:59 PM. If you have any questions, ask your nurse or doctor.        albuterol 108 (90 Base) MCG/ACT inhaler Commonly known as: VENTOLIN HFA Inhale 2 puffs into the lungs every 6 (six) hours as needed for wheezing or shortness of breath.   aspirin 81 MG EC tablet Take 1 tablet (81 mg total) by mouth daily.   atorvastatin 40 MG tablet Commonly known as: LIPITOR Take 1 tablet (40 mg total) by mouth daily.   clopidogrel 75 MG tablet Commonly known as: PLAVIX Take 2 tablets (150 mg total) by mouth daily.   insulin glargine 100 UNIT/ML Solostar Pen Commonly known as: LANTUS Inject 20 Units into the skin at bedtime.   metFORMIN 500 MG 24 hr tablet Commonly known as: GLUCOPHAGE-XR Take 1 pill with breakfast and 1 pill with supper every day.   metoprolol succinate 25 MG 24 hr tablet Commonly known as: Toprol XL Take 1 tablet (25 mg total) by mouth daily.   ofloxacin 0.3 % ophthalmic solution Commonly known as: OCUFLOX ofloxacin 0.3 % eye drops  INSTILL 1 DROP INTO LEFT EYE 4 TIMES DAILY   ReliOn True Metrix Test Strips test strip Generic drug: glucose blood  Use as instructed to check blood sugars twice daily. Dx E11.9   varenicline 1 MG tablet Commonly known as: APO-Varenicline Take 1 tablet (1 mg total) by mouth 2 (two) times daily. Take 0.5 mg daily x 3 days, then 0.5 mg BID x 4 days, then 1 mg BID thereafter   Vitron-C 65-125 MG Tabs Generic drug: Iron-Vitamin C Take 1 tablet by mouth daily.       Allergies:  Allergies  Allergen Reactions  . Wasp Venom Anaphylaxis  . Wasp Venom Protein Anaphylaxis  . Coffee Flavor Nausea And Vomiting    Patient states  coffee gives him nausea and stomach cramps  . Onion     Family History: Family History  Problem Relation Age of Onset  . Diabetes Mellitus II Mother   . Diabetes Mellitus II Brother   . Hyperlipidemia Brother   . Colon cancer Maternal Aunt   . Colon cancer Maternal Uncle     Social History:  reports that he has been smoking cigarettes. He has a 96.00 pack-year smoking history. He has never used smokeless tobacco. He reports that he does not drink alcohol and does not use drugs.   Physical Exam: BP 113/81   Pulse (!) 105   Ht 5\' 4"  (1.626 m)   Wt 181 lb (82.1 kg)   BMI 31.07 kg/m   Constitutional:  Alert and oriented, No acute distress. HEENT: [place] AT, moist mucus membranes.  Trachea midline, no masses. Cardiovascular: No clubbing, cyanosis, or edema. Respiratory: Normal respiratory effort, no increased work of breathing. GI: Abdomen is soft, nontender, nondistended, no abdominal masses GU: Phallus without lesions, testes descended bilaterally without masses or tenderness R 20 cc/L 15 cc.  Prostate 60 g, smooth without nodules; nontender, no pelvic floor tenderness Lymph: No cervical or inguinal lymphadenopathy. Skin: No rashes, bruises or suspicious lesions. Neurologic: Grossly intact, no focal deficits, moving all 4 extremities. Psychiatric: Normal mood and affect.   Assessment & Plan:    1.  BPH with mild LUTS  Postvoid dribbling is bothersome  Trial tamsulosin 0.4 mg daily, Rx sent  2.  Rectal discomfort  Could be secondary to prostatic inflammation  Rx as above  3.  Abnormal ejaculation  May be neurogenic and secondary to diabetes  Prostatic inflammation less likely however reassess after tamsulosin trial  PA follow-up 1 month for symptom recheck   [name], [place] [address] [place], [place] [address] [phone]

## 2019-11-03 ENCOUNTER — Other Ambulatory Visit: Payer: Self-pay

## 2019-11-03 ENCOUNTER — Ambulatory Visit (INDEPENDENT_AMBULATORY_CARE_PROVIDER_SITE_OTHER): Payer: Medicare HMO

## 2019-11-03 DIAGNOSIS — E538 Deficiency of other specified B group vitamins: Secondary | ICD-10-CM | POA: Diagnosis not present

## 2019-11-03 MED ORDER — CYANOCOBALAMIN 1000 MCG/ML IJ SOLN
1000.0000 ug | Freq: Once | INTRAMUSCULAR | Status: AC
  Administered 2019-11-03: 1000 ug via INTRAMUSCULAR

## 2019-11-03 NOTE — Progress Notes (Signed)
Patient presented for B 12 injection to left deltoid, patient voiced no concerns nor showed any signs of distress during injection. 

## 2019-11-04 ENCOUNTER — Ambulatory Visit: Payer: Medicare HMO | Admitting: Pulmonary Disease

## 2019-11-04 ENCOUNTER — Encounter: Payer: Self-pay | Admitting: Pulmonary Disease

## 2019-11-04 VITALS — BP 126/74 | HR 94 | Temp 97.3°F | Ht 64.0 in | Wt 183.4 lb

## 2019-11-04 DIAGNOSIS — R0602 Shortness of breath: Secondary | ICD-10-CM

## 2019-11-04 MED ORDER — PANTOPRAZOLE SODIUM 40 MG PO TBEC: 40.0000 mg | DELAYED_RELEASE_TABLET | Freq: Every day | ORAL | 3 refills | Status: DC

## 2019-11-04 MED ORDER — STIOLTO RESPIMAT 2.5-2.5 MCG/ACT IN AERS: 2.0000 | INHALATION_SPRAY | Freq: Every day | RESPIRATORY_TRACT | 0 refills | Status: DC

## 2019-11-04 NOTE — Patient Instructions (Signed)
We are going to schedule some breathing tests.  I have sent a prescription to your pharmacy for a medication to reduce your heartburn this is called Protonix, this should not interfere with your other medications.  It is once a day.   We will also give you a trial of Stiolto 2 puffs daily to help with your breathing.  Let us know how this does for you and we can send a prescription in to your pharmacy.   We will see you in follow-up in 4 months time call sooner should any new problems arise.

## 2019-11-04 NOTE — Progress Notes (Signed)
 Assessment & Plan:  1. SOB (shortness of breath) (Primary)   Patient Instructions  We are going to schedule some breathing tests.  I have sent a prescription to your pharmacy for a medication to reduce your heartburn this is called Protonix , this should not interfere with your other medications.  It is once a day.   We will also give you a trial of Stiolto 2 puffs daily to help with your breathing.  Let us  know how this does for you and we can send a prescription in to your pharmacy.   We will see you in follow-up in 4 months time call sooner should any new problems arise.  Please note: late entry documentation due to logistical difficulties during COVID-19 pandemic. This note is filed for information purposes only, and is not intended to be used for billing, nor does it represent the full scope/nature of the visit in question. Please see any associated scanned media linked to date of encounter for additional pertinent information.  Subjective:    HPI: Benjamin Conley is a 54 y.o. male presenting to the pulmonology clinic on 11/04/2019 with report of: pulmonary consult (per Suzen Barefoot, NP--c/o sob with exertion and prod cough with white to yellow mucus x6y)     Outpatient Encounter Medications as of 11/04/2019  Medication Sig   [DISCONTINUED] albuterol  (VENTOLIN  HFA) 108 (90 Base) MCG/ACT inhaler Inhale 2 puffs into the lungs every 6 (six) hours as needed for wheezing or shortness of breath. (Patient not taking: Reported on 10/16/2020)   [DISCONTINUED] aspirin  EC 81 MG EC tablet Take 1 tablet (81 mg total) by mouth daily. (Patient not taking: Reported on 10/16/2020)   [DISCONTINUED] atorvastatin  (LIPITOR) 40 MG tablet Take 1 tablet (40 mg total) by mouth daily. (Patient not taking: Reported on 10/16/2020)   [DISCONTINUED] clopidogrel  (PLAVIX ) 75 MG tablet Take 2 tablets (150 mg total) by mouth daily. (Patient not taking: Reported on 01/03/2020)   [DISCONTINUED] glucose blood  (RELION TRUE METRIX TEST STRIPS) test strip Use as instructed to check blood sugars twice daily. Dx E11.9   [DISCONTINUED] insulin  glargine (LANTUS ) 100 UNIT/ML Solostar Pen Inject 20 Units into the skin at bedtime. (Patient taking differently: Inject 30 Units into the skin 2 (two) times daily.)   [DISCONTINUED] Iron -Vitamin C (VITRON-C) 65-125 MG TABS Take 1 tablet by mouth daily. (Patient not taking: Reported on 01/03/2020)   [DISCONTINUED] metFORMIN  (GLUCOPHAGE -XR) 500 MG 24 hr tablet Take 1 pill with breakfast and 1 pill with supper every day.   [DISCONTINUED] metoprolol  succinate (TOPROL  XL) 25 MG 24 hr tablet Take 1 tablet (25 mg total) by mouth daily.   [DISCONTINUED] ofloxacin (OCUFLOX) 0.3 % ophthalmic solution ofloxacin 0.3 % eye drops  INSTILL 1 DROP INTO LEFT EYE 4 TIMES DAILY   [DISCONTINUED] tamsulosin  (FLOMAX ) 0.4 MG CAPS capsule Take 1 capsule (0.4 mg total) by mouth daily.   [DISCONTINUED] varenicline  (APO-VARENICLINE ) 1 MG tablet Take 1 tablet (1 mg total) by mouth 2 (two) times daily. Take 0.5 mg daily x 3 days, then 0.5 mg BID x 4 days, then 1 mg BID thereafter   [DISCONTINUED] pantoprazole  (PROTONIX ) 40 MG tablet Take 1 tablet (40 mg total) by mouth daily. (Patient not taking: Reported on 06/16/2020)   [DISCONTINUED] Tiotropium Bromide-Olodaterol (STIOLTO RESPIMAT ) 2.5-2.5 MCG/ACT AERS Inhale 2 puffs into the lungs daily. (Patient not taking: Reported on 01/30/2021)   No facility-administered encounter medications on file as of 11/04/2019.      Objective:   Vitals:  11/04/19 1347  BP: 126/74  Pulse: 94  Temp: (!) 97.3 F (36.3 C)  Height: 5' 4 (1.626 m)  Weight: 183 lb 6.4 oz (83.2 kg)  SpO2: 100%  TempSrc: Temporal  BMI (Calculated): 31.47     Physical exam documentation is limited by delayed entry of information.

## 2019-11-09 ENCOUNTER — Ambulatory Visit: Payer: Medicare HMO | Admitting: Pharmacist

## 2019-11-09 DIAGNOSIS — Z720 Tobacco use: Secondary | ICD-10-CM

## 2019-11-09 DIAGNOSIS — E119 Type 2 diabetes mellitus without complications: Secondary | ICD-10-CM

## 2019-11-09 DIAGNOSIS — J449 Chronic obstructive pulmonary disease, unspecified: Secondary | ICD-10-CM

## 2019-11-09 DIAGNOSIS — E785 Hyperlipidemia, unspecified: Secondary | ICD-10-CM

## 2019-11-09 NOTE — Chronic Care Management (AMB) (Signed)
Chronic Care Management   Follow Up Note   11/09/2019 Name: Benjamin Conley MRN: [id number] DOB: [birth year]  Referred by: [name], NP Reason for referral : Chronic Care Management (Medication Management)   Benjamin Conley is a 54 y.o. year old male who is a primary care patient of [name], NP. The CCM team was consulted for assistance with chronic disease management and care coordination needs.    Contacted patient for medication management review.   Review of patient status, including review of consultants reports, relevant laboratory and other test results, and collaboration with appropriate care team members and the patient's provider was performed as part of comprehensive patient evaluation and provision of chronic care management services.    SDOH (Social Determinants of Health) assessments performed: Yes See Care Plan activities for detailed interventions related to SDOH)  SDOH Interventions     Most Recent Value  SDOH Interventions  SDOH Interventions for the Following Domains Tobacco  Financial Strain Interventions Intervention Not Indicated  Tobacco Interventions Cessation Materials Given and Reviewed       Outpatient Encounter Medications as of 11/09/2019  Medication Sig  . aspirin EC 81 MG EC tablet Take 1 tablet (81 mg total) by mouth daily.  [name] atorvastatin (LIPITOR) 40 MG tablet Take 1 tablet (40 mg total) by mouth daily.  . clopidogrel (PLAVIX) 75 MG tablet Take 2 tablets (150 mg total) by mouth daily.  [name] glucose blood (RELION TRUE METRIX TEST STRIPS) test strip Use as instructed to check blood sugars twice daily. Dx E11.9  . insulin glargine (LANTUS) 100 UNIT/ML Solostar Pen Inject 20 Units into the skin at bedtime.  . Iron-Vitamin C (VITRON-C) 65-125 MG TABS Take 1 tablet by mouth daily.  . metFORMIN (GLUCOPHAGE-XR) 500 MG 24 hr tablet Take 1 pill with breakfast and 1 pill with supper every day.  . metoprolol succinate (TOPROL XL) 25 MG 24  hr tablet Take 1 tablet (25 mg total) by mouth daily.  [name] ofloxacin (OCUFLOX) 0.3 % ophthalmic solution ofloxacin 0.3 % eye drops  INSTILL 1 DROP INTO LEFT EYE 4 TIMES DAILY  . pantoprazole (PROTONIX) 40 MG tablet Take 1 tablet (40 mg total) by mouth daily.  . tamsulosin (FLOMAX) 0.4 MG CAPS capsule Take 1 capsule (0.4 mg total) by mouth daily.  . Tiotropium Bromide-Olodaterol (STIOLTO RESPIMAT) 2.5-2.5 MCG/ACT AERS Inhale 2 puffs into the lungs daily.  . varenicline (APO-VARENICLINE) 1 MG tablet Take 1 tablet (1 mg total) by mouth 2 (two) times daily. Take 0.5 mg daily x 3 days, then 0.5 mg BID x 4 days, then 1 mg BID thereafter  . albuterol (VENTOLIN HFA) 108 (90 Base) MCG/ACT inhaler Inhale 2 puffs into the lungs every 6 (six) hours as needed for wheezing or shortness of breath.   No facility-administered encounter medications on file as of 11/09/2019.     Objective:   Goals Addressed              This Visit's Progress     Patient Stated   .  PharmD "I want to stay healthy" (pt-stated)        CARE PLAN ENTRY (see longitudinal plan of care for additional care plan information)  Current Barriers:  . Social, community, and financial barriers:  o No medication cost concerns. Medicare + Medicaid.  . Diabetes: uncontrolled but significantly improved; complicated by chronic medical conditions including splenic infarct, tobacco abuse, HTN, HLD, most recent A1c 7.5% . Most recent eGFR: >60 mL/min .  Current antihyperglycemic regimen: metformin XR 1000 mg QAM, 500 mg QPM (had attempted to increase to 1000 mg BID, but developed diarrhea); Lantus 20 mg daily  . Denies any episodes of hypoglycemia, but reports some symptoms of hypoglycemia (shakiness) when blood sugars are normal . Current glucose readings:  o Fastings: 100-140s o Evening: ~150-200s . Current meal patterns: o Drinks: was drinking 24 pack of pepsi daily, now down to ~1 per day.  . Cardiovascular risk reduction: o Current  hypertensive regimen: metoprolol succinate 25 mg daily  o Current hyperlipidemia regimen: atorvastatin 40 mg daily; LDL now at goal <70 o Current antiplatelet regimen: ASA 81 mg daily + clopidogrel 150 mg daily (s/p splenic infarction) . Tobacco Abuse: Chanitx 1 mg BID, has been on max dose for ~2 weeks. Still smoking ~ 1.5 PPD smoking 1.5 ppd, started at age 70. Notes that he is having a hard time quitting because he "doesn't have anything else to do". Notes he might try to get away for a while to go metal detecting or fishing, but having a hard time finding somewhere to go. Hunting season is over, so that isn't an option. Has not contacted [place] [facility] yet . COPD: saw Dr. Patsey Berthold, started on Stiolto 2 puffs daily.  . BPH: saw Dr. Bernardo Heater, started on tamsulosin 0.4 mg daily. Denies any benefit yet.  . Supplements: Vitron (iron + Vit C); patient does report that he did have a few days of dark stools, but this resolved. He had started iron supplement at that time. Wonders if he needs evaluation.  Pharmacist Clinical Goal(s):  [name] Over the next 90 days, patient will work with PharmD and primary care provider to address optimized medication management  Interventions: . Comprehensive medication review performed, medication list updated in electronic medical record . Inter-disciplinary care team collaboration (see longitudinal plan of care) . Praised for significant improvement in A1c.  . Reviewed goal A1c, goal fasting, and goal 2 hour post prandial. Discussed goal of transitioning off of insulin and onto non-insulin agent (such as GLP1 or SGLT2). However, given symptomatic hypoglycemia at this time, recommend continuing current regimen with no changes. Patient verbalizes understanding. He will contact me with any episodes of hypoglycemia . Recommend continuing metformin XR 1000 mg QAM, 500 mg QPM. Will collaborate w/ PCP to send updated script to the pharmacy.  . Reviewed goal of tobacco cessation.  Patient ready to quit, but having a difficult time finding other things to occupy his time. He has agreed to contact [place] [facility] by the end of the week to talk to counselor for CBT. Will mail [place] [facility] flyer as a reminder to patient.  . Reviewed concepts of maintenance inhaler. Encouraged to continue to take Stiolto daily until f/u with Dr. Patsey Berthold.  . Counseled that iron supplements can cause dark stools. Will confer w/ PCP to see if she recommends any other evaluation.  Patient Self Care Activities:  . Patient will check blood glucose BID, document, and provide at future appointments . Patient will take medications as prescribed . Patient will report any questions or concerns to provider   Please see past updates related to this goal by clicking on the "Past Updates" button in the selected goal          Plan:  - Scheduled f/u call in ~ 5 weeks  Catie Darnelle Maffucci, PharmD, [place], [place] Pharmacist [place] [phone]

## 2019-11-09 NOTE — Patient Instructions (Signed)
Mr. Clapper,   It was great talking with you today. FANTASTIC JOB WITH YOUR DIABETES! Your last A1c was 7.5%!!!!!!  For now, keep up the great work with 3 tablets of metformin daily and Lantus 20 units daily. At our next call, I want [place] to talk a bit about some medications that we can swap with insulin.   CALL THE [place] [facility]IT[facility] (if you haven't already). The MOST SUCCESSFUL way to quit smoking is the combination of medication (Chantix) and behavioral support (talking to someone at the Quitline). We're all here to help you!  Call me with any questions or concerns in the meantime.   Catie Feliz Beam, PharmD [phone]  Visit Information  Goals Addressed              This Visit's Progress     Patient Stated   .  PharmD "I want to stay healthy" (pt-stated)        CARE PLAN ENTRY (see longitudinal plan of care for additional care plan information)  Current Barriers:  . Social, community, and financial barriers:  o No medication cost concerns. Medicare + Medicaid.  . Diabetes: uncontrolled but significantly improved; complicated by chronic medical conditions including splenic infarct, tobacco abuse, HTN, HLD, most recent A1c 7.5% . Most recent eGFR: >60 mL/min . Current antihyperglycemic regimen: metformin XR 1000 mg QAM, 500 mg QPM (had attempted to increase to 1000 mg BID, but developed diarrhea); Lantus 20 mg daily  . Denies any episodes of hypoglycemia, but reports some symptoms of hypoglycemia (shakiness) when blood sugars are normal . Current glucose readings:  o Fastings: 100-140s o Evening: ~150-200s . Current meal patterns: o Drinks: was drinking 24 pack of pepsi daily, now down to ~1 per day.  . Cardiovascular risk reduction: o Current hypertensive regimen: metoprolol succinate 25 mg daily  o Current hyperlipidemia regimen: atorvastatin 40 mg daily; LDL now at goal <70 o Current antiplatelet regimen: ASA 81 mg daily + clopidogrel 150 mg daily (s/p splenic  infarction) . Tobacco Abuse: Chanitx 1 mg BID, has been on max dose for ~2 weeks. Still smoking ~ 1.5 PPD smoking 1.5 ppd, started at age 54. Notes that he is having a hard time quitting because he "doesn't have anything else to do". Notes he might try to get away for a while to go metal detecting or fishing, but having a hard time finding somewhere to go. Hunting season is over, so that isn't an option. Has not contacted [facility] [facility]uit[facility] yet . COPD: saw Dr. Jayme Cloud, started on Stiolto 2 puffs daily.  . BPH: saw Dr. Lonna Cobb, started on tamsulosin 0.4 mg daily. Denies any benefit yet.  . Supplements: Vitron (iron + Vit C); patient does report that he did have a few days of dark stools, but this resolved. He had started iron supplement at that time. Wonders if he needs evaluation.  Pharmacist Clinical Goal(s):  [name] Over the next 90 days, patient will work with PharmD and primary care provider to address optimized medication management  Interventions: . Comprehensive medication review performed, medication list updated in electronic medical record . Inter-disciplinary care team collaboration (see longitudinal plan of care) . Praised for significant improvement in A1c.  . Reviewed goal A1c, goal fasting, and goal 2 hour post prandial. Discussed goal of transitioning off of insulin and onto non-insulin agent (such as GLP1 or SGLT2). However, given symptomatic hypoglycemia at this time, recommend continuing current regimen with no changes. Patient verbalizes understanding. He will contact me with any episodes  of hypoglycemia . Recommend continuing metformin XR 1000 mg QAM, 500 mg QPM. Will collaborate w/ PCP to send updated script to the pharmacy.  . Reviewed goal of tobacco cessation. Patient ready to quit, but having a difficult time finding other things to occupy his time. He has agreed to contact [facility] by the end of the week to talk to counselor for CBT. Will mail  Quitline flyer as a reminder to  patient.  . Reviewed concepts of maintenance inhaler. Encouraged to continue to take Stiolto daily until f/u with Dr. Patsey Berthold.  . Counseled that iron supplements can cause dark stools. Will confer w/ PCP to see if she recommends any other evaluation.  Patient Self Care Activities:  . Patient will check blood glucose BID, document, and provide at future appointments . Patient will take medications as prescribed . Patient will report any questions or concerns to provider   Please see past updates related to this goal by clicking on the "Past Updates" button in the selected goal         The patient verbalized understanding of instructions provided today and agreed to receive a mailed copy of patient instruction and/or educational materials.  Plan:  - Scheduled f/u call in ~ 5 weeks  Catie Darnelle Maffucci, PharmD, [place], [place] Pharmacist [place] [phone]

## 2019-11-10 ENCOUNTER — Other Ambulatory Visit: Payer: Self-pay

## 2019-11-10 ENCOUNTER — Ambulatory Visit (INDEPENDENT_AMBULATORY_CARE_PROVIDER_SITE_OTHER): Payer: Medicare HMO

## 2019-11-10 ENCOUNTER — Other Ambulatory Visit: Payer: Self-pay | Admitting: Nurse Practitioner

## 2019-11-10 DIAGNOSIS — E538 Deficiency of other specified B group vitamins: Secondary | ICD-10-CM

## 2019-11-10 DIAGNOSIS — K921 Melena: Secondary | ICD-10-CM

## 2019-11-10 MED ORDER — CYANOCOBALAMIN 1000 MCG/ML IJ SOLN
1000.0000 ug | Freq: Once | INTRAMUSCULAR | Status: AC
  Administered 2019-11-10: 1000 ug via INTRAMUSCULAR

## 2019-11-10 MED ORDER — METFORMIN HCL ER 500 MG PO TB24: ORAL_TABLET | ORAL | 1 refills | Status: DC

## 2019-11-10 NOTE — Progress Notes (Signed)
Please draw a CBC when he comes in today for B12 shot to check black stools. Likely from new start oral iron. Hx of GI bleed- so will check.

## 2019-11-10 NOTE — Addendum Note (Signed)
Addended by: Tor Netters I on: 11/10/2019 03:23 PM   Modules accepted: Orders

## 2019-11-10 NOTE — Progress Notes (Signed)
Patient presented for B 12 injection to left deltoid, patient voiced no concerns nor showed any signs of distress during injection. 

## 2019-11-11 LAB — CBC WITH DIFFERENTIAL/PLATELET
Basophils Absolute: 0.1 10*3/uL (ref 0.0–0.1)
Basophils Relative: 0.9 % (ref 0.0–3.0)
Eosinophils Absolute: 0.1 10*3/uL (ref 0.0–0.7)
Eosinophils Relative: 1.3 % (ref 0.0–5.0)
HCT: 36.1 % — ABNORMAL LOW (ref 39.0–52.0)
Hemoglobin: 11.9 g/dL — ABNORMAL LOW (ref 13.0–17.0)
Lymphocytes Relative: 34.6 % (ref 12.0–46.0)
Lymphs Abs: 3.6 10*3/uL (ref 0.7–4.0)
MCHC: 33.1 g/dL (ref 30.0–36.0)
MCV: 93.1 fl (ref 78.0–100.0)
Monocytes Absolute: 0.8 10*3/uL (ref 0.1–1.0)
Monocytes Relative: 7.8 % (ref 3.0–12.0)
Neutro Abs: 5.7 10*3/uL (ref 1.4–7.7)
Neutrophils Relative %: 55.4 % (ref 43.0–77.0)
Platelets: 452 10*3/uL — ABNORMAL HIGH (ref 150.0–400.0)
RBC: 3.88 Mil/uL — ABNORMAL LOW (ref 4.22–5.81)
RDW: 15.9 % — ABNORMAL HIGH (ref 11.5–15.5)
WBC: 10.3 10*3/uL (ref 4.0–10.5)

## 2019-11-17 ENCOUNTER — Ambulatory Visit (INDEPENDENT_AMBULATORY_CARE_PROVIDER_SITE_OTHER): Payer: Medicare HMO

## 2019-11-17 ENCOUNTER — Other Ambulatory Visit: Payer: Self-pay

## 2019-11-17 DIAGNOSIS — E538 Deficiency of other specified B group vitamins: Secondary | ICD-10-CM | POA: Diagnosis not present

## 2019-11-17 MED ORDER — CYANOCOBALAMIN 1000 MCG/ML IJ SOLN
1000.0000 ug | Freq: Once | INTRAMUSCULAR | Status: AC
  Administered 2019-11-17: 1000 ug via INTRAMUSCULAR

## 2019-11-17 NOTE — Progress Notes (Signed)
Patient presented for B 12 injection to left deltoid, patient voiced no concerns nor showed any signs of distress during injection. 

## 2019-11-24 ENCOUNTER — Other Ambulatory Visit: Payer: Self-pay

## 2019-11-24 ENCOUNTER — Ambulatory Visit (INDEPENDENT_AMBULATORY_CARE_PROVIDER_SITE_OTHER): Payer: Medicare HMO

## 2019-11-24 DIAGNOSIS — E538 Deficiency of other specified B group vitamins: Secondary | ICD-10-CM | POA: Diagnosis not present

## 2019-11-24 MED ORDER — CYANOCOBALAMIN 1000 MCG/ML IJ SOLN
1000.0000 ug | Freq: Once | INTRAMUSCULAR | Status: AC
  Administered 2019-11-24: 1000 ug via INTRAMUSCULAR

## 2019-11-24 NOTE — Progress Notes (Signed)
Patient presented for B 12 injection to left deltoid, patient voiced no concerns nor showed any signs of distress during injection. 

## 2019-12-02 ENCOUNTER — Encounter: Payer: Self-pay | Admitting: Urology

## 2019-12-02 ENCOUNTER — Other Ambulatory Visit: Payer: Self-pay

## 2019-12-02 ENCOUNTER — Ambulatory Visit (INDEPENDENT_AMBULATORY_CARE_PROVIDER_SITE_OTHER): Payer: Medicare HMO | Admitting: Urology

## 2019-12-02 VITALS — BP 139/83 | HR 106 | Ht 65.0 in | Wt 186.0 lb

## 2019-12-02 DIAGNOSIS — N401 Enlarged prostate with lower urinary tract symptoms: Secondary | ICD-10-CM

## 2019-12-02 DIAGNOSIS — N521 Erectile dysfunction due to diseases classified elsewhere: Secondary | ICD-10-CM

## 2019-12-02 DIAGNOSIS — N3943 Post-void dribbling: Secondary | ICD-10-CM

## 2019-12-02 DIAGNOSIS — E1169 Type 2 diabetes mellitus with other specified complication: Secondary | ICD-10-CM | POA: Diagnosis not present

## 2019-12-02 DIAGNOSIS — N5319 Other ejaculatory dysfunction: Secondary | ICD-10-CM | POA: Diagnosis not present

## 2019-12-02 MED ORDER — SILDENAFIL CITRATE 100 MG PO TABS: 100.0000 mg | ORAL_TABLET | Freq: Every day | ORAL | 0 refills | Status: DC | PRN

## 2019-12-02 NOTE — Progress Notes (Signed)
12/02/2019 2:07 PM   Benjamin Conley [birth year] [id number]  Referring provider: [name], NP [address] [place],   [address]  Chief Complaint  Patient presents with   Benign Prostatic Hypertrophy    HPI: Benjamin Conley is a 54 y.o. male seen at the request of Benjamin Paradise, NP for evaluation of rectal pain and abnormal ejaculation.   Several week history rectal pressure/discomfort   Complains of some decrease stream and postvoid dribbling  Has been unable to ejaculate  Denies dysuria, gross hematuria  No flank, abdominal or pelvic pain  No prior history urologic problems  No SSRI medication  PSA 10/21/2019 was 2.21  He states the rectal pain has improved with the addition of the tamsulosin.  He is experiencing a dry ejaculate.   He is also having difficulty maintaining an erection as well.  He has not tried PDE 5 inhibitors in the past as they have been cost prohibitive.  He has been having the issues with erections for 2 years.  He is no longer having spontaneous erections.  He does not have pain with erections or curvature with erections.  He is currently taking Chantix in an effort to quit smoking.  He is also being reevaluated for sleep apnea as he has a history of this disease.   PMH: Past Medical History:  Diagnosis Date   Acute posthemorrhagic anemia    Complication of anesthesia    c/o difficulty breathing after anesthesia   COPD (chronic obstructive pulmonary disease) (HCC)    Diabetes mellitus without complication ([facility] [facility])    Hyperlipemia    Melena 08/21/2019   Paroxysmal supraventricular tachycardia (HCC) 08/19/2019   Postural dizziness with presyncope 08/21/2019   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   Tobacco abuse    Upper GI bleed 08/21/2019    Surgical History: Past Surgical History:  Procedure Laterality Date   BACK SURGERY     lumbar   COLONOSCOPY WITH PROPOFOL N/A 08/24/2019   Procedure: COLONOSCOPY  WITH PROPOFOL;  Surgeon: [name], MD;  Location: [facility] ENDOSCOPY;  Service: Endoscopy;  Laterality: N/A;   ESOPHAGOGASTRODUODENOSCOPY (EGD) WITH PROPOFOL N/A 08/24/2019   Procedure: ESOPHAGOGASTRODUODENOSCOPY (EGD) WITH PROPOFOL;  Surgeon: [name], MD;  Location: [facility] ENDOSCOPY;  Service: Endoscopy;  Laterality: N/A;   STENT PLACEMENT VASCULAR ([facility])  07/2019   stenosis of distal splenic artery and stent placed   VISCERAL ANGIOGRAPHY N/A 08/06/2019   Procedure: VISCERAL ANGIOGRAPHY;  Surgeon: [name], MD;  Location: [facility] CV LAB;  Service: Cardiovascular;  Laterality: N/A;    Home Medications:  Allergies as of 12/02/2019      Reactions   Wasp Venom Anaphylaxis   Wasp Venom Protein Anaphylaxis   Coffee Flavor Nausea And Vomiting   Patient states coffee gives him nausea and stomach cramps   Onion       Medication List       Accurate as of December 02, 2019  2:07 PM. If you have any questions, ask your nurse or doctor.        albuterol 108 (90 Base) MCG/ACT inhaler Commonly known as: VENTOLIN HFA Inhale 2 puffs into the lungs every 6 (six) hours as needed for wheezing or shortness of breath.   aspirin 81 MG EC tablet Take 1 tablet (81 mg total) by mouth daily.   atorvastatin 40 MG tablet Commonly known as: LIPITOR Take 1 tablet (40 mg total) by mouth daily.   clopidogrel 75 MG tablet Commonly known as: PLAVIX  Take 2 tablets (150 mg total) by mouth daily.   insulin glargine 100 UNIT/ML Solostar Pen Commonly known as: LANTUS Inject 20 Units into the skin at bedtime.   metFORMIN 500 MG 24 hr tablet Commonly known as: GLUCOPHAGE-XR Take 2 tablets (1,000 mg total) by mouth daily with breakfast AND 1 tablet (500 mg total) daily with supper.   metoprolol succinate 25 MG 24 hr tablet Commonly known as: Toprol XL Take 1 tablet (25 mg total) by mouth daily.   ofloxacin 0.3 % ophthalmic solution Commonly known as: OCUFLOX ofloxacin 0.3 % eye  drops  INSTILL 1 DROP INTO LEFT EYE 4 TIMES DAILY   pantoprazole 40 MG tablet Commonly known as: Protonix Take 1 tablet (40 mg total) by mouth daily.   ReliOn True Metrix Test Strips test strip Generic drug: glucose blood Use as instructed to check blood sugars twice daily. Dx E11.9   Stiolto Respimat 2.5-2.5 MCG/ACT Aers Generic drug: Tiotropium Bromide-Olodaterol Inhale 2 puffs into the lungs daily.   tamsulosin 0.4 MG Caps capsule Commonly known as: FLOMAX Take 1 capsule (0.4 mg total) by mouth daily.   varenicline 1 MG tablet Commonly known as: APO-Varenicline Take 1 tablet (1 mg total) by mouth 2 (two) times daily. Take 0.5 mg daily x 3 days, then 0.5 mg BID x 4 days, then 1 mg BID thereafter   Vitron-C 65-125 MG Tabs Generic drug: Iron-Vitamin C Take 1 tablet by mouth daily.       Allergies:  Allergies  Allergen Reactions   Wasp Venom Anaphylaxis   Wasp Venom Protein Anaphylaxis   Coffee Flavor Nausea And Vomiting    Patient states coffee gives him nausea and stomach cramps   Onion     Family History: Family History  Problem Relation Age of Onset   Diabetes Mellitus II Mother    Diabetes Mellitus II Brother    Hyperlipidemia Brother    Colon cancer Maternal Aunt    Colon cancer Maternal Uncle     Social History:  reports that he has been smoking cigarettes. He has a 96.00 pack-year smoking history. He has never used smokeless tobacco. He reports that he does not drink alcohol and does not use drugs.   Physical Exam: BP 139/83    Pulse (!) 106    Ht 5\' 5"  (1.651 m)    Wt 186 lb (84.4 kg)    BMI 30.95 kg/m   Constitutional:  Well nourished. Alert and oriented, No acute distress. HEENT: [place] AT, mask in place.  Trachea midline Cardiovascular: No clubbing, cyanosis, or edema. Respiratory: Normal respiratory effort, no increased work of breathing. Neurologic: Grossly intact, no focal deficits, moving all 4 extremities. Psychiatric: Normal mood and  affect.   Assessment & Plan:    1.  BPH with mild LUTS  Postvoid dribbling is improved with tamsulosin  Continue tamsulosin   2.  Rectal discomfort  Improved  Rx as above  3.  Abnormal ejaculation  May be neurogenic and secondary to diabetes  Prostatic inflammation less likely however reassess after tamsulosin trial  Still an issue, but we will tackle his erectile dysfunction first  4.  Erectile dysfunction  Patient states that he does not take any nitroglycerin products  We will have a trial of Viagra 100 mg, take 30 minutes to two hours prior to intercourse on an empty stomach, # 30   Follow-up 1 month for SHIM score   Zara Council, PA-C  [place] [address]  [place], [place] [address] [phone]

## 2019-12-06 ENCOUNTER — Ambulatory Visit: Payer: Medicare HMO | Admitting: Pharmacist

## 2019-12-06 DIAGNOSIS — J449 Chronic obstructive pulmonary disease, unspecified: Secondary | ICD-10-CM

## 2019-12-06 DIAGNOSIS — E785 Hyperlipidemia, unspecified: Secondary | ICD-10-CM

## 2019-12-06 NOTE — Chronic Care Management (AMB) (Signed)
Chronic Care Management   Follow Up Note   12/06/2019 Name: Benjamin Conley MRN: [id number] DOB: [birth year]  Referred by: [name], NP Reason for referral : Chronic Care Management (Medication Management)   Roel Saud Bail is a 54 y.o. year old male who is a primary care patient of [name], NP. The CCM team was consulted for assistance with chronic disease management and care coordination needs.    Received call from patient today with medication management questions.   Review of patient status, including review of consultants reports, relevant laboratory and other test results, and collaboration with appropriate care team members and the patient's provider was performed as part of comprehensive patient evaluation and provision of chronic care management services.    SDOH (Social Determinants of Health) assessments performed: No See Care Plan activities for detailed interventions related to [facility])     Outpatient Encounter Medications as of 12/06/2019  Medication Sig  . albuterol (VENTOLIN HFA) 108 (90 Base) MCG/ACT inhaler Inhale 2 puffs into the lungs every 6 (six) hours as needed for wheezing or shortness of breath.  [name] aspirin EC 81 MG EC tablet Take 1 tablet (81 mg total) by mouth daily.  [name] atorvastatin (LIPITOR) 40 MG tablet Take 1 tablet (40 mg total) by mouth daily.  . clopidogrel (PLAVIX) 75 MG tablet Take 2 tablets (150 mg total) by mouth daily.  [name] glucose blood (RELION TRUE METRIX TEST STRIPS) test strip Use as instructed to check blood sugars twice daily. Dx E11.9  . insulin glargine (LANTUS) 100 UNIT/ML Solostar Pen Inject 20 Units into the skin at bedtime.  . Iron-Vitamin C (VITRON-C) 65-125 MG TABS Take 1 tablet by mouth daily.  . metFORMIN (GLUCOPHAGE-XR) 500 MG 24 hr tablet Take 2 tablets (1,000 mg total) by mouth daily with breakfast AND 1 tablet (500 mg total) daily with supper.  . metoprolol succinate (TOPROL XL) 25 MG 24 hr tablet Take 1 tablet  (25 mg total) by mouth daily.  [name] ofloxacin (OCUFLOX) 0.3 % ophthalmic solution ofloxacin 0.3 % eye drops  INSTILL 1 DROP INTO LEFT EYE 4 TIMES DAILY  . pantoprazole (PROTONIX) 40 MG tablet Take 1 tablet (40 mg total) by mouth daily.  . sildenafil (VIAGRA) 100 MG tablet Take 1 tablet (100 mg total) by mouth daily as needed for erectile dysfunction.  . tamsulosin (FLOMAX) 0.4 MG CAPS capsule Take 1 capsule (0.4 mg total) by mouth daily.  . Tiotropium Bromide-Olodaterol (STIOLTO RESPIMAT) 2.5-2.5 MCG/ACT AERS Inhale 2 puffs into the lungs daily.  . varenicline (APO-VARENICLINE) 1 MG tablet Take 1 tablet (1 mg total) by mouth 2 (two) times daily. Take 0.5 mg daily x 3 days, then 0.5 mg BID x 4 days, then 1 mg BID thereafter   No facility-administered encounter medications on file as of 12/06/2019.     Objective:   Goals Addressed              This Visit's Progress     Patient Stated   .  PharmD "I want to stay healthy" (pt-stated)        CARE PLAN ENTRY (see longitudinal plan of care for additional care plan information)  Current Barriers:  . Social, community, and financial barriers:  o Calls today with access issues. Notes that he tried to request refills on atorvastatin, varenicline, and tamsulosin, and that [facility] noted they would fax our office for refill requests. Does not appear that atorvastatin or varenicline need refills. Tamsulosin comes from urology.  o No medication cost concerns. Medicare + Medicaid.  . Diabetes: uncontrolled but significantly improved; complicated by chronic medical conditions including splenic infarct, tobacco abuse, HTN, HLD, most recent A1c 7.5% . Most recent eGFR: >60 mL/min . Current antihyperglycemic regimen: metformin XR 1000 mg QAM, 500 mg QPM; Lantus 20 mg daily  . Cardiovascular risk reduction: o Current hypertensive regimen: metoprolol succinate 25 mg daily  o Current hyperlipidemia regimen: atorvastatin 40 mg daily; LDL now at goal  <70 o Current antiplatelet regimen: ASA 81 mg daily + clopidogrel 150 mg daily (s/p splenic infarction) . Tobacco Abuse: Chanitx 1 mg BID, Still smoking ~ 1.5 PPD (~68 pack-year history) . COPD: Dr. Patsey Berthold; Stiolto 2 puffs daily  . BPH: Dr. Bernardo Heater; tamsulosin 0.4 mg daily . Supplements: Vitron (iron + Vit C)  Pharmacist Clinical Goal(s):  [name] Over the next 90 days, patient will work with PharmD and primary care provider to address optimized medication management  Interventions: . Educated patient to call Urology for refill on tamsulosin. He notes he will do so.  . Contacted [facility]. They just refilled atorvastatin, so it is not due for a fill yet. They will place a refill request on apo-varenicline.  [name] Contacted patient, reviewed the above. He verbalizes understanding.  Patient Self Care Activities:  . Patient will check blood glucose BID, document, and provide at future appointments . Patient will take medications as prescribed . Patient will report any questions or concerns to provider   Please see past updates related to this goal by clicking on the "Past Updates" button in the selected goal          Plan:  - Will outreach as previously scheduled  Catie Darnelle Maffucci, PharmD, [place], [place] Pharmacist [place] [phone]

## 2019-12-06 NOTE — Patient Instructions (Signed)
Visit Information  Goals Addressed              This Visit's Progress     Patient Stated   .  PharmD "I want to stay healthy" (pt-stated)        CARE PLAN ENTRY (see longitudinal plan of care for additional care plan information)  Current Barriers:  . Social, community, and financial barriers:  o Calls today with access issues. Notes that he tried to request refills on atorvastatin, varenicline, and tamsulosin, and that [facility] noted they would fax our office for refill requests. Does not appear that atorvastatin or varenicline need refills. Tamsulosin comes from urology.  o No medication cost concerns. Medicare + Medicaid.  . Diabetes: uncontrolled but significantly improved; complicated by chronic medical conditions including splenic infarct, tobacco abuse, HTN, HLD, most recent A1c 7.5% . Most recent eGFR: >60 mL/min . Current antihyperglycemic regimen: metformin XR 1000 mg QAM, 500 mg QPM; Lantus 20 mg daily  . Cardiovascular risk reduction: o Current hypertensive regimen: metoprolol succinate 25 mg daily  o Current hyperlipidemia regimen: atorvastatin 40 mg daily; LDL now at goal <70 o Current antiplatelet regimen: ASA 81 mg daily + clopidogrel 150 mg daily (s/p splenic infarction) . Tobacco Abuse: Chanitx 1 mg BID, Still smoking ~ 1.5 PPD (~68 pack-year history) . COPD: Dr. Patsey Berthold; Stiolto 2 puffs daily  . BPH: Dr. Bernardo Heater; tamsulosin 0.4 mg daily . Supplements: Vitron (iron + Vit C)  Pharmacist Clinical Goal(s):  [name] Over the next 90 days, patient will work with PharmD and primary care provider to address optimized medication management  Interventions: . Educated patient to call Urology for refill on tamsulosin. He notes he will do so.  . Contacted [facility]. They just refilled atorvastatin, so it is not due for a fill yet. They will place a refill request on apo-varenicline.  [name] Contacted patient, reviewed the above. He verbalizes understanding.  Patient Self Care  Activities:  . Patient will check blood glucose BID, document, and provide at future appointments . Patient will take medications as prescribed . Patient will report any questions or concerns to provider   Please see past updates related to this goal by clicking on the "Past Updates" button in the selected goal         The patient verbalized understanding of instructions provided today and declined a print copy of patient instruction materials.   Plan:  - Will outreach as previously scheduled  Catie Darnelle Maffucci, PharmD, [place], [place] Pharmacist [place] [phone]

## 2019-12-09 ENCOUNTER — Encounter: Payer: Self-pay | Admitting: Podiatry

## 2019-12-09 ENCOUNTER — Ambulatory Visit (INDEPENDENT_AMBULATORY_CARE_PROVIDER_SITE_OTHER): Payer: Medicare HMO | Admitting: Podiatry

## 2019-12-09 ENCOUNTER — Other Ambulatory Visit: Payer: Self-pay

## 2019-12-09 DIAGNOSIS — M79675 Pain in left toe(s): Secondary | ICD-10-CM | POA: Diagnosis not present

## 2019-12-09 DIAGNOSIS — B351 Tinea unguium: Secondary | ICD-10-CM

## 2019-12-09 DIAGNOSIS — E1142 Type 2 diabetes mellitus with diabetic polyneuropathy: Secondary | ICD-10-CM | POA: Diagnosis not present

## 2019-12-09 DIAGNOSIS — D689 Coagulation defect, unspecified: Secondary | ICD-10-CM | POA: Diagnosis not present

## 2019-12-09 DIAGNOSIS — S91209A Unspecified open wound of unspecified toe(s) with damage to nail, initial encounter: Secondary | ICD-10-CM

## 2019-12-09 DIAGNOSIS — M79674 Pain in right toe(s): Secondary | ICD-10-CM | POA: Diagnosis not present

## 2019-12-09 NOTE — Progress Notes (Signed)
This patient returns to my office for at risk foot care.  This patient requires this care by a professional since this patient will be at risk due to having coagulation defect, diabetes.  Patient is taking plavix.  Patient says his nails are turning black and frequently come off .  This patient is unable to cut nails himself since the patient cannot reach his nails.These nails are painful walking and wearing shoes. This patient says his second toenail left foot avulsed prior to coming to this office.  Patient applied a bandaid. This patient presents for at risk foot care today.  General Appearance  Alert, conversant and in no acute stress.  Vascular  Dorsalis pedis and posterior tibial  pulses are palpable  bilaterally.  Capillary return is within normal limits  bilaterally. Temperature is within normal limits  bilaterally.  Neurologic  Senn-Weinstein monofilament wire test absent  bilaterally. Muscle power within normal limits bilaterally.  Nails Thick disfigured discolored nails with subungual debris  from hallux to fifth toes bilaterally. No evidence of bacterial infection or drainage bilaterally. Nail plate second toe left absent with blood oozing due to his blood thinner.  Orthopedic  No limitations of motion  feet .  No crepitus or effusions noted.  Mild  HAV  B/L.  Skin  normotropic skin with no porokeratosis noted bilaterally.  No signs of infections or ulcers noted.     Onychomycosis  Pain in right toes  Pain in left toes  Diabetic neuropathy.  Nail avulsion second toe left.  Consent was obtained for treatment procedures.   Mechanical debridement of nails 1-5  bilaterally performed with a nail nipper.  Filed with dremel without incident. Patient qualifies for diabetic shoes due to DPN and HAV.  His second toe nailbed bandaged with bandage to stop the bleeding.  To heck on his shoes.These shoes may cause trauma to the nails which he is unable to feel due to absent  LOPS.   Return office  visit  3 months                    Told patient to return for periodic foot care and evaluation due to potential at risk complications.   Gardiner Barefoot DPM

## 2019-12-20 ENCOUNTER — Ambulatory Visit: Payer: Medicare HMO | Admitting: Oncology

## 2019-12-20 ENCOUNTER — Telehealth: Payer: Self-pay | Admitting: *Deleted

## 2019-12-20 ENCOUNTER — Ambulatory Visit: Payer: Medicare HMO

## 2019-12-20 ENCOUNTER — Other Ambulatory Visit: Payer: Medicare HMO

## 2019-12-20 NOTE — Telephone Encounter (Signed)
"  I believe I had an appointment this morning at 9:30.  Call me back."

## 2019-12-22 ENCOUNTER — Other Ambulatory Visit: Payer: Self-pay

## 2019-12-22 ENCOUNTER — Ambulatory Visit: Payer: Medicare HMO | Admitting: Orthotics

## 2019-12-24 NOTE — Progress Notes (Signed)
[facility]  Telephone:([phone]) [phone] Fax:(336) [phone]  ID: Benjamin Conley OB: 1965/10/14  MR#: [id number]  DDU#:[id number]  Patient Care Team: [name], NP as PCP - General (Nurse Practitioner) [name], MD as PCP - Cardiology (Cardiology) [name], RPH-CPP as Pharmacist (Pharmacist) Lloyd Huger, MD as Consulting Physician (Oncology)  CHIEF COMPLAINT: Iron deficiency anemia, thrombocytosis.  INTERVAL HISTORY: Patient returns to clinic today for further evaluation and consideration of additional IV iron and IM B12.  He continues to feel well and remains asymptomatic.  He denies any weakness or fatigue.  He has no neurologic complaints. He has a good appetite and denies weight loss.  He has no chest pain, shortness of breath, cough, or hemoptysis.  He denies any nausea, vomiting, constipation, or diarrhea.  He denies any melena or hematochezia.  He has no urinary complaints.  Patient offers no specific complaints today.  REVIEW OF SYSTEMS:   Review of Systems  Constitutional: Negative.  Negative for fever, malaise/fatigue and weight loss.  Respiratory: Negative.  Negative for cough, hemoptysis and shortness of breath.   Cardiovascular: Negative.  Negative for chest pain and leg swelling.  Gastrointestinal: Negative.  Negative for abdominal pain, blood in stool and melena.  Genitourinary: Negative.  Negative for hematuria.  Musculoskeletal: Negative.  Negative for back pain.  Skin: Negative.  Negative for rash.  Neurological: Negative.  Negative for dizziness, weakness and headaches.  Psychiatric/Behavioral: Negative.  The patient is not nervous/anxious.     As per HPI. Otherwise, a complete review of systems is negative.  PAST MEDICAL HISTORY: Past Medical History:  Diagnosis Date  . Acute posthemorrhagic anemia   . Complication of anesthesia    c/o difficulty breathing after anesthesia  . COPD (chronic obstructive  pulmonary disease) ([place])   . Diabetes mellitus without complication ([place])   . Hyperlipemia   . Melena 08/21/2019  . Paroxysmal supraventricular tachycardia ([place]) 08/19/2019  . Postural dizziness with presyncope 08/21/2019  . 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 Tobacco abuse   . Upper GI bleed 08/21/2019    PAST SURGICAL HISTORY: Past Surgical History:  Procedure Laterality Date  . BACK SURGERY     lumbar  . COLONOSCOPY WITH PROPOFOL N/A 08/24/2019   Procedure: COLONOSCOPY WITH PROPOFOL;  Surgeon: [name], MD;  Location: [facility] ENDOSCOPY;  Service: Endoscopy;  Laterality: N/A;  . ESOPHAGOGASTRODUODENOSCOPY (EGD) WITH PROPOFOL N/A 08/24/2019   Procedure: ESOPHAGOGASTRODUODENOSCOPY (EGD) WITH PROPOFOL;  Surgeon: [name], MD;  Location: [facility] ENDOSCOPY;  Service: Endoscopy;  Laterality: N/A;  . STENT PLACEMENT VASCULAR ([place] HX)  07/2019   stenosis of distal splenic artery and stent placed  . VISCERAL ANGIOGRAPHY N/A 08/06/2019   Procedure: VISCERAL ANGIOGRAPHY;  Surgeon: [name], MD;  Location: [facility] CV LAB;  Service: Cardiovascular;  Laterality: N/A;    FAMILY HISTORY: Family History  Problem Relation Age of Onset  . Diabetes Mellitus II Mother   . Diabetes Mellitus II Brother   . Hyperlipidemia Brother   . Colon cancer Maternal Aunt   . Colon cancer Maternal Uncle     ADVANCED DIRECTIVES (Y/N):  N  HEALTH MAINTENANCE: Social History   Tobacco Use  . Smoking status: Current Every Day Smoker    Packs/day: 1.00    Years: 48.00    Pack years: 48.00    Types: Cigarettes  . Smokeless tobacco: Never Used  Vaping Use  . Vaping Use: Never used  Substance Use Topics  . Alcohol use: No  Comment: Never been a problem  . Drug use: No     Colonoscopy:  PAP:  Bone density:  Lipid panel:  Allergies  Allergen Reactions  . Wasp Venom Anaphylaxis  . Wasp Venom Protein Anaphylaxis  . Coffee Flavor Nausea And Vomiting    Patient states coffee gives him  nausea and stomach cramps  . Onion     Current Outpatient Medications  Medication Sig Dispense Refill  . albuterol (VENTOLIN HFA) 108 (90 Base) MCG/ACT inhaler Inhale 2 puffs into the lungs every 6 (six) hours as needed for wheezing or shortness of breath. 6.7 g 3  . aspirin EC 81 MG EC tablet Take 1 tablet (81 mg total) by mouth daily. 90 tablet 1  . atorvastatin (LIPITOR) 40 MG tablet Take 1 tablet (40 mg total) by mouth daily. 90 tablet 1  . clopidogrel (PLAVIX) 75 MG tablet Take 2 tablets (150 mg total) by mouth daily. 90 tablet 1  . glucose blood (RELION TRUE METRIX TEST STRIPS) test strip Use as instructed to check blood sugars twice daily. Dx E11.9 200 each 12  . insulin glargine (LANTUS) 100 UNIT/ML Solostar Pen Inject 20 Units into the skin at bedtime. 15 mL 11  . Iron-Vitamin C (VITRON-C) 65-125 MG TABS Take 1 tablet by mouth daily. 60 tablet 0  . metFORMIN (GLUCOPHAGE-XR) 500 MG 24 hr tablet Take 2 tablets (1,000 mg total) by mouth daily with breakfast AND 1 tablet (500 mg total) daily with supper. 270 tablet 1  . metoprolol succinate (TOPROL XL) 25 MG 24 hr tablet Take 1 tablet (25 mg total) by mouth daily. 90 tablet 3  . ofloxacin (OCUFLOX) 0.3 % ophthalmic solution ofloxacin 0.3 % eye drops  INSTILL 1 DROP INTO LEFT EYE 4 TIMES DAILY    . pantoprazole (PROTONIX) 40 MG tablet Take 1 tablet (40 mg total) by mouth daily. 30 tablet 3  . sildenafil (VIAGRA) 100 MG tablet Take 1 tablet (100 mg total) by mouth daily as needed for erectile dysfunction. 30 tablet 0  . tamsulosin (FLOMAX) 0.4 MG CAPS capsule Take 1 capsule (0.4 mg total) by mouth daily. 30 capsule 1  . Tiotropium Bromide-Olodaterol (STIOLTO RESPIMAT) 2.5-2.5 MCG/ACT AERS Inhale 2 puffs into the lungs daily. 1 each 0  . varenicline (APO-VARENICLINE) 1 MG tablet Take 1 tablet (1 mg total) by mouth 2 (two) times daily. Take 0.5 mg daily x 3 days, then 0.5 mg BID x 4 days, then 1 mg BID thereafter 56 tablet 3   No current  facility-administered medications for this visit.    OBJECTIVE: Vitals:   12/30/19 1316  BP: 129/80  Pulse: 98  Temp: 97.7 F (36.5 C)  SpO2: 100%     Body mass index is 30.54 kg/m.    ECOG FS:0 - Asymptomatic  General: Well-developed, well-nourished, no acute distress. Eyes: Pink conjunctiva, anicteric sclera. HEENT: Normocephalic, moist mucous membranes. Lungs: No audible wheezing or coughing. Heart: Regular rate and rhythm. Abdomen: Soft, nontender, no obvious distention. Musculoskeletal: No edema, cyanosis, or clubbing. Neuro: Alert, answering all questions appropriately. Cranial nerves grossly intact. Skin: No rashes or petechiae noted. Psych: Normal affect.  LAB RESULTS:  Lab Results  Component Value Date   NA 140 10/21/2019   K 4.0 10/21/2019   CL 103 10/21/2019   CO2 30 10/21/2019   GLUCOSE 148 (H) 10/21/2019   BUN 11 10/21/2019   CREATININE 0.70 10/21/2019   CALCIUM 9.4 10/21/2019   PROT 7.4 09/06/2019   ALBUMIN 4.5 09/06/2019  AST 11 09/06/2019   ALT 17 09/06/2019   ALKPHOS 79 09/06/2019   BILITOT 0.3 09/06/2019   GFRNONAA >60 09/23/2019   GFRAA >60 09/23/2019    Lab Results  Component Value Date   WBC 9.8 12/30/2019   NEUTROABS 5.3 12/30/2019   HGB 13.4 12/30/2019   HCT 39.6 12/30/2019   MCV 88.8 12/30/2019   PLT 442 (H) 12/30/2019   Lab Results  Component Value Date   IRON 85 12/30/2019   TIBC 445 12/30/2019   IRONPCTSAT 19 12/30/2019   Lab Results  Component Value Date   FERRITIN 17 (L) 12/30/2019     STUDIES: No results found.  ASSESSMENT: Iron deficiency anemia, thrombocytosis. PLAN:   1. Iron deficiency anemia: Patient's hemoglobin and iron stores are now essentially within normal limits. Colonoscopy and EGD in June 2021 revealed a total of 4 angiodysplastic lesions that were cauterized with argon laser.  No other significant pathology was noted.  Patient does not require additional Feraheme today.  He last received treatment  on September 27, 2019.  Return to clinic in 3 months with repeat laboratory work, further evaluation, and continuation of treatment if needed. 2.  Thrombocytosis: Improved, monitor.  JAK2 mutation is negative. 3.  B12 deficiency: Proceed with 1000 mcg intramuscular B12 today.  Monitor and repeat laboratory work at next clinic visit.  Patient expressed understanding and was in agreement with this plan. He also understands that He can call clinic at any time with any questions, concerns, or complaints.    Lloyd Huger, MD   12/30/2019 4:05 PM

## 2019-12-29 ENCOUNTER — Other Ambulatory Visit: Payer: Self-pay | Admitting: *Deleted

## 2019-12-29 DIAGNOSIS — D500 Iron deficiency anemia secondary to blood loss (chronic): Secondary | ICD-10-CM

## 2019-12-30 ENCOUNTER — Inpatient Hospital Stay: Payer: Medicare HMO

## 2019-12-30 ENCOUNTER — Inpatient Hospital Stay: Payer: Medicare HMO | Attending: Oncology | Admitting: Oncology

## 2019-12-30 ENCOUNTER — Other Ambulatory Visit: Payer: Self-pay

## 2019-12-30 ENCOUNTER — Encounter: Payer: Self-pay | Admitting: Oncology

## 2019-12-30 VITALS — BP 129/80 | HR 98 | Temp 97.7°F | Ht 65.0 in | Wt 183.5 lb

## 2019-12-30 DIAGNOSIS — E538 Deficiency of other specified B group vitamins: Secondary | ICD-10-CM | POA: Diagnosis not present

## 2019-12-30 DIAGNOSIS — Z8349 Family history of other endocrine, nutritional and metabolic diseases: Secondary | ICD-10-CM | POA: Diagnosis not present

## 2019-12-30 DIAGNOSIS — F17210 Nicotine dependence, cigarettes, uncomplicated: Secondary | ICD-10-CM | POA: Diagnosis not present

## 2019-12-30 DIAGNOSIS — Z79899 Other long term (current) drug therapy: Secondary | ICD-10-CM | POA: Diagnosis not present

## 2019-12-30 DIAGNOSIS — Z833 Family history of diabetes mellitus: Secondary | ICD-10-CM | POA: Diagnosis not present

## 2019-12-30 DIAGNOSIS — D500 Iron deficiency anemia secondary to blood loss (chronic): Secondary | ICD-10-CM | POA: Diagnosis not present

## 2019-12-30 DIAGNOSIS — Z794 Long term (current) use of insulin: Secondary | ICD-10-CM | POA: Diagnosis not present

## 2019-12-30 DIAGNOSIS — D75839 Thrombocytosis, unspecified: Secondary | ICD-10-CM | POA: Insufficient documentation

## 2019-12-30 DIAGNOSIS — J449 Chronic obstructive pulmonary disease, unspecified: Secondary | ICD-10-CM | POA: Diagnosis not present

## 2019-12-30 DIAGNOSIS — E119 Type 2 diabetes mellitus without complications: Secondary | ICD-10-CM | POA: Diagnosis not present

## 2019-12-30 DIAGNOSIS — E785 Hyperlipidemia, unspecified: Secondary | ICD-10-CM | POA: Insufficient documentation

## 2019-12-30 DIAGNOSIS — Z7982 Long term (current) use of aspirin: Secondary | ICD-10-CM | POA: Diagnosis not present

## 2019-12-30 DIAGNOSIS — D509 Iron deficiency anemia, unspecified: Secondary | ICD-10-CM | POA: Insufficient documentation

## 2019-12-30 LAB — CBC WITH DIFFERENTIAL/PLATELET
Abs Immature Granulocytes: 0.04 10*3/uL (ref 0.00–0.07)
Basophils Absolute: 0.1 10*3/uL (ref 0.0–0.1)
Basophils Relative: 1 %
Eosinophils Absolute: 0.1 10*3/uL (ref 0.0–0.5)
Eosinophils Relative: 1 %
HCT: 39.6 % (ref 39.0–52.0)
Hemoglobin: 13.4 g/dL (ref 13.0–17.0)
Immature Granulocytes: 0 %
Lymphocytes Relative: 37 %
Lymphs Abs: 3.6 10*3/uL (ref 0.7–4.0)
MCH: 30 pg (ref 26.0–34.0)
MCHC: 33.8 g/dL (ref 30.0–36.0)
MCV: 88.8 fL (ref 80.0–100.0)
Monocytes Absolute: 0.6 10*3/uL (ref 0.1–1.0)
Monocytes Relative: 6 %
Neutro Abs: 5.3 10*3/uL (ref 1.7–7.7)
Neutrophils Relative %: 55 %
Platelets: 442 10*3/uL — ABNORMAL HIGH (ref 150–400)
RBC: 4.46 MIL/uL (ref 4.22–5.81)
RDW: 13.5 % (ref 11.5–15.5)
WBC: 9.8 10*3/uL (ref 4.0–10.5)
nRBC: 0 % (ref 0.0–0.2)

## 2019-12-30 LAB — IRON AND TIBC
Iron: 85 ug/dL (ref 45–182)
Saturation Ratios: 19 % (ref 17.9–39.5)
TIBC: 445 ug/dL (ref 250–450)
UIBC: 360 ug/dL

## 2019-12-30 LAB — FERRITIN: Ferritin: 17 ng/mL — ABNORMAL LOW (ref 24–336)

## 2019-12-30 MED ORDER — CYANOCOBALAMIN 1000 MCG/ML IJ SOLN
1000.0000 ug | Freq: Once | INTRAMUSCULAR | Status: AC
  Administered 2019-12-30: 1000 ug via INTRAMUSCULAR
  Filled 2019-12-30: qty 1

## 2019-12-30 NOTE — Progress Notes (Signed)
Patient states he is feeling very tired and sleeping more than usual. He denies any pain or other concerns at this time

## 2019-12-31 ENCOUNTER — Telehealth: Payer: Medicare HMO

## 2020-01-03 ENCOUNTER — Other Ambulatory Visit: Payer: Self-pay | Admitting: Nurse Practitioner

## 2020-01-03 ENCOUNTER — Other Ambulatory Visit (INDEPENDENT_AMBULATORY_CARE_PROVIDER_SITE_OTHER): Payer: Self-pay | Admitting: Nurse Practitioner

## 2020-01-03 ENCOUNTER — Ambulatory Visit: Payer: Medicare HMO | Admitting: Pharmacist

## 2020-01-03 ENCOUNTER — Other Ambulatory Visit: Payer: Self-pay | Admitting: Urology

## 2020-01-03 DIAGNOSIS — D735 Infarction of spleen: Secondary | ICD-10-CM

## 2020-01-03 DIAGNOSIS — E785 Hyperlipidemia, unspecified: Secondary | ICD-10-CM

## 2020-01-03 DIAGNOSIS — E119 Type 2 diabetes mellitus without complications: Secondary | ICD-10-CM

## 2020-01-03 MED ORDER — CLOPIDOGREL BISULFATE 75 MG PO TABS
75.0000 mg | ORAL_TABLET | Freq: Every day | ORAL | 3 refills | Status: DC
Start: 2020-01-03 — End: 2020-10-16

## 2020-01-03 NOTE — Chronic Care Management (AMB) (Signed)
Chronic Care Management   Follow Up Note   01/03/2020 Name: Benjamin Conley MRN: [id number] DOB: Feb 22, 1966  Referred by: [name], NP Reason for referral : Chronic Care Management (Medication Management)   Benjamin Conley is a 54 y.o. year old male who is a primary care patient of [name], NP. The CCM team was consulted for assistance with chronic disease management and care coordination needs.    Contacted patient for medication management review.   Review of patient status, including review of consultants reports, relevant laboratory and other test results, and collaboration with appropriate care team members and the patient's provider was performed as part of comprehensive patient evaluation and provision of chronic care management services.    SDOH (Social Determinants of Health) assessments performed: No See Care Plan activities for detailed interventions related to [facility])     Outpatient Encounter Medications as of 01/03/2020  Medication Sig   aspirin EC 81 MG EC tablet Take 1 tablet (81 mg total) by mouth daily.   atorvastatin (LIPITOR) 40 MG tablet Take 1 tablet (40 mg total) by mouth daily.   glucose blood (RELION TRUE METRIX TEST STRIPS) test strip Use as instructed to check blood sugars twice daily. Dx E11.9   insulin glargine (LANTUS) 100 UNIT/ML Solostar Pen Inject 20 Units into the skin at bedtime.   metFORMIN (GLUCOPHAGE-XR) 500 MG 24 hr tablet Take 2 tablets (1,000 mg total) by mouth daily with breakfast AND 1 tablet (500 mg total) daily with supper.   metoprolol succinate (TOPROL XL) 25 MG 24 hr tablet Take 1 tablet (25 mg total) by mouth daily.   tamsulosin (FLOMAX) 0.4 MG CAPS capsule Take 1 capsule (0.4 mg total) by mouth daily.   varenicline (APO-VARENICLINE) 1 MG tablet Take 1 tablet (1 mg total) by mouth 2 (two) times daily. Take 0.5 mg daily x 3 days, then 0.5 mg BID x 4 days, then 1 mg BID thereafter   albuterol (VENTOLIN HFA)  108 (90 Base) MCG/ACT inhaler Inhale 2 puffs into the lungs every 6 (six) hours as needed for wheezing or shortness of breath. (Patient not taking: Reported on 01/03/2020)   Iron-Vitamin C (VITRON-C) 65-125 MG TABS Take 1 tablet by mouth daily. (Patient not taking: Reported on 01/03/2020)   pantoprazole (PROTONIX) 40 MG tablet Take 1 tablet (40 mg total) by mouth daily. (Patient not taking: Reported on 01/03/2020)   sildenafil (VIAGRA) 100 MG tablet Take 1 tablet (100 mg total) by mouth daily as needed for erectile dysfunction. (Patient not taking: Reported on 01/03/2020)   Tiotropium Bromide-Olodaterol (STIOLTO RESPIMAT) 2.5-2.5 MCG/ACT AERS Inhale 2 puffs into the lungs daily. (Patient not taking: Reported on 01/03/2020)   [DISCONTINUED] clopidogrel (PLAVIX) 75 MG tablet Take 2 tablets (150 mg total) by mouth daily. (Patient not taking: Reported on 01/03/2020)   [DISCONTINUED] ofloxacin (OCUFLOX) 0.3 % ophthalmic solution ofloxacin 0.3 % eye drops  INSTILL 1 DROP INTO LEFT EYE 4 TIMES DAILY   No facility-administered encounter medications on file as of 01/03/2020.     Objective:   Goals Addressed              This Visit's Progress     Patient Stated     PharmD "I want to stay healthy" (pt-stated)        CARE PLAN ENTRY (see longitudinal plan of care for additional care plan information)  Current Barriers:   Social, community, and financial barriers:  o Difficult time reviewing medication today, he does  not use a pill box or any adherence methods. Keeps his medications in a bag together, pulls out and takes dialy.   Diabetes: uncontrolled but significantly improved; complicated by chronic medical conditions including splenic infarct, tobacco abuse, HTN, HLD, most recent A1c 7.5%  Most recent eGFR: >60 mL/min  Current antihyperglycemic regimen: metformin XR [phone] mg QAM (reports diarrhea sometimes when he takes 1000 mg QAM), 500 mg QPM; Lantus 20 mg daily   Current  glucose readings:  o Fasting: 110-170s, generally >140 o 2 hour post meal: 115-260, generally >180  Cardiovascular risk reduction: o Current hypertensive regimen: metoprolol succinate 25 mg daily  o Current hyperlipidemia regimen: atorvastatin 40 mg daily; LDL now at goal <70 o Current antiplatelet regimen: ASA 81 mg daily + clopidogrel 150 mg daily (s/p splenic infarction)  Tobacco Abuse: Chanitx 1 mg BID, Still smoking ~ 1 PPD   COPD: Dr. Patsey Berthold; Stiolto 2 puffs daily, though notes he is not taking this medication daily  GERD: pantoprazole 40 mg daily - has run out, has not refilled  BPH: Dr. Bernardo Heater; tamsulosin 0.4 mg daily  Supplements: Vitron (iron + Vit C) - reports he needs a refill of this  Pharmacist Clinical Goal(s):   Over the next 90 days, patient will work with PharmD and primary care provider to address optimized medication management  Interventions:  Comprehensive medication review performed, medication list updated in electronic medical record  Inter-disciplinary care team collaboration (see longitudinal plan of care)  Messaged [name], NP. Patient is far out enough from the event to reduce clopidogrel to 75 mg daily. [place] sending refill on that.   Recommend reducing metformin XR to 500 mg BID as max tolerated dose w/o GI side effects. Will collaborate w/ PCP.   Patient needs improved fasting and post prandial coverage, but in a glucose dependent manner to prevent hypoglycemia. Recommend initiation of GLP1, such as Ozempic. If Ozempic started, would reduce Lantus dose by ~1/3 to 13 units daily to reduce risk of hypoglycemia. Goal to maximize GLP1 and minimize/eliminate insulin. Will discuss w/ PCP  Mailing patient BID pill box w/ list of how to fill   Patient requests refill on iron + vitamin C supplement. Will collaborate w/ PCP on this  Patient Self Care Activities:   Patient will check blood glucose BID, document, and provide at future  appointments  Patient will continue to focus on reducing tobacco use.   Patient will take medications as prescribed  Patient will report any questions or concerns to provider   Please see past updates related to this goal by clicking on the "Past Updates" button in the selected goal          Plan:  - Scheduled f/u call in ~ 6 weeks  Catie Darnelle Maffucci, PharmD, [place], [place] Pharmacist [place] [phone]

## 2020-01-03 NOTE — Progress Notes (Signed)
01/04/2020 1:42 PM   Benjamin Conley Jul 28, 1965 [id number]  Referring provider: [name], NP [address] [place],  [place] [address]  Chief Complaint  Patient presents with  . Erectile Dysfunction    HPI: Benjamin Conley is a 54 y.o. with prostatitis, ED and ejaculatory disorder who presents today for follow up after a trial of sildenafil.  Erectile dysfunction SHIM score: 5     He did take the Viagra on 2 occasions and did not find that it worked.  I did explain to him that he needs to be sexually excited in order for the Viagra to be effective that it just would not cause a spontaneous erection just by taking the medication. Risk factors:  age, BPH, COPD, DM, HTN, HLD and smoking No painful erections or curvatures with his erections.    He is no longer having spontaneous erections.  Tried:    PDE 5 inhibitors   SHIM    Row Name 01/04/20 1301         SHIM: Over the last 6 months:   How do you rate your confidence that you could get and keep an erection? Very Low     When you had erections with sexual stimulation, how often were your erections hard enough for penetration (entering your partner)? Almost Never or Never     During sexual intercourse, how often were you able to maintain your erection after you had penetrated (entered) your partner? Almost Never or Never     During sexual intercourse, how difficult was it to maintain your erection to completion of intercourse? Extremely Difficult     When you attempted sexual intercourse, how often was it satisfactory for you? Almost Never or Never       SHIM Total Score   SHIM 5            Score: 1-7 Severe ED 8-11 Moderate ED 12-16 Mild-Moderate ED 17-21 Mild ED 22-25 No ED  He did state that he has developed was some burning with urination last week.  The burning occurs with intermittently.  Patient denies any modifying or aggravating factors.  Patient denies any gross hematuria or  suprapubic/flank pain.  Patient denies any fevers, chills, nausea or vomiting.   UA with 3+ glucose otherwise unremarkable.  PVR is 19 mL.  His blood sugars have been running 120-250's.   PMH: Past Medical History:  Diagnosis Date  . Acute posthemorrhagic anemia   . Complication of anesthesia    c/o difficulty breathing after anesthesia  . COPD (chronic obstructive pulmonary disease) ([place])   . Diabetes mellitus without complication ([place])   . Hyperlipemia   . Melena 08/21/2019  . Paroxysmal supraventricular tachycardia ([place]) 08/19/2019  . Postural dizziness with presyncope 08/21/2019  . 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 Tobacco abuse   . Upper GI bleed 08/21/2019    Surgical History: Past Surgical History:  Procedure Laterality Date  . BACK SURGERY     lumbar  . COLONOSCOPY WITH PROPOFOL N/A 08/24/2019   Procedure: COLONOSCOPY WITH PROPOFOL;  Surgeon: [name], MD;  Location[facility] ENDOSCOPY;  Service: Endoscopy;  Laterality: N/A;  . ESOPHAGOGASTRODUODENOSCOPY (EGD) WITH PROPOFOL N/A 08/24/2019   Procedure: ESOPHAGOGASTRODUODENOSCOPY (EGD) WITH PROPOFOL;  Surgeon: [name], MD;  Location[facility]COPY;  Service: Endoscopy;  Laterality: N/A;  . STENT PLACEMENT VASCULAR ([place] [facility])  07/2019   stenosis of distal splenic artery and stent placed  . VISCERAL ANGIOGRAPHY N/A 08/06/2019   Procedure: VISCERAL ANGIOGRAPHY;  Surgeon: [name], MD;  Location: [facility] CV LAB;  Service: Cardiovascular;  Laterality: N/A;    Home Medications:  Allergies as of 01/04/2020      Reactions   Wasp Venom Anaphylaxis   Wasp Venom Protein Anaphylaxis   Coffee Flavor Nausea And Vomiting   Patient states coffee gives him nausea and stomach cramps   Onion       Medication List       Accurate as of January 04, 2020  1:42 PM. If you have any questions, ask your nurse or doctor.        albuterol 108 (90 Base) MCG/ACT inhaler Commonly known as: VENTOLIN HFA Inhale 2 puffs into the  lungs every 6 (six) hours as needed for wheezing or shortness of breath.   aspirin 81 MG EC tablet Take 1 tablet (81 mg total) by mouth daily.   atorvastatin 40 MG tablet Commonly known as: LIPITOR Take 1 tablet (40 mg total) by mouth daily.   clopidogrel 75 MG tablet Commonly known as: PLAVIX Take 1 tablet (75 mg total) by mouth daily.   insulin glargine 100 UNIT/ML Solostar Pen Commonly known as: LANTUS Inject 20 Units into the skin at bedtime.   metFORMIN 500 MG 24 hr tablet Commonly known as: GLUCOPHAGE-XR Take 1 tablet (500 mg total) by mouth 2 (two) times daily after a meal. What changed: See the new instructions. Changed by: [name], NP   metoprolol succinate 25 MG 24 hr tablet Commonly known as: Toprol XL Take 1 tablet (25 mg total) by mouth daily.   Ozempic (0.25 or 0.5 MG/DOSE) 2 MG/1.5ML Sopn Generic drug: Semaglutide(0.25 or 0.5MG /DOS) Inject 0.25 mg into subcutaneous skin once a week for 4 weeks and if tolerated increase the dose to 0.50 mg subcutanous once a week. Started by: [name], NP   pantoprazole 40 MG tablet Commonly known as: Protonix Take 1 tablet (40 mg total) by mouth daily.   ReliOn True Metrix Test Strips test strip Generic drug: glucose blood Use as instructed to check blood sugars twice daily. Dx E11.9   sildenafil 100 MG tablet Commonly known as: VIAGRA Take 1 tablet (100 mg total) by mouth daily as needed for erectile dysfunction.   Stiolto Respimat 2.5-2.5 MCG/ACT Aers Generic drug: Tiotropium Bromide-Olodaterol Inhale 2 puffs into the lungs daily.   tamsulosin 0.4 MG Caps capsule Commonly known as: FLOMAX Take 1 capsule (0.4 mg total) by mouth daily.   varenicline 1 MG tablet Commonly known as: APO-Varenicline Take 1 tablet (1 mg total) by mouth 2 (two) times daily. Take 0.5 mg daily x 3 days, then 0.5 mg BID x 4 days, then 1 mg BID thereafter   Vitron-C 65-125 MG Tabs Generic drug: Iron-Vitamin C Take 1 tablet  by mouth daily.       Allergies:  Allergies  Allergen Reactions  . Wasp Venom Anaphylaxis  . Wasp Venom Protein Anaphylaxis  . Coffee Flavor Nausea And Vomiting    Patient states coffee gives him nausea and stomach cramps  . Onion     Family History: Family History  Problem Relation Age of Onset  . Diabetes Mellitus II Mother   . Diabetes Mellitus II Brother   . Hyperlipidemia Brother   . Colon cancer Maternal Aunt   . Colon cancer Maternal Uncle     Social History:  reports that he has been smoking cigarettes. He has a 48.00 pack-year smoking history. He has never used smokeless tobacco. He reports that he  does not drink alcohol and does not use drugs.   Physical Exam: BP 117/78   Pulse 70   Ht 5\' 5"  (1.651 m)   Wt 183 lb (83 kg)   BMI 30.45 kg/m   Constitutional:  Well nourished. Alert and oriented, No acute distress. HEENT: [place] AT, mask in place.  Trachea midline Cardiovascular: No clubbing, cyanosis, or edema. Respiratory: Normal respiratory effort, no increased work of breathing. Neurologic: Grossly intact, no focal deficits, moving all 4 extremities. Psychiatric: Normal mood and affect.  Laboratory data Component     Latest Ref Rng & Units 01/14/2020  Specific Gravity, UA     1.005 - 1.030 >1.030 (H)  pH, UA     5.0 - 7.5 6.5  Color, UA     Yellow Yellow  Appearance Ur     Clear Clear  Leukocytes,UA     Negative Negative  Protein,UA     Negative/Trace Negative  Glucose, UA     Negative Negative  Ketones, UA     Negative Negative  RBC, UA     Negative Negative  Bilirubin, UA     Negative Negative  Urobilinogen, Ur     0.2 - 1.0 mg/dL 1.0  Nitrite, UA     Negative Negative  Microscopic Examination      See below:   Component     Latest Ref Rng & Units 01/14/2020          WBC, UA     0 - 5 /hpf 0-5  RBC     0 - 2 /hpf 0-2  Epithelial Cells (non renal)     0 - 10 /hpf None seen  Mucus, UA     Not Estab. Present (A)  Bacteria, UA      None seen/Few None seen   I have reviewed the labs.  Assessment & Plan:    1. Dysuria Explained to the patient that his UA demonstrated he had sugar in his urine and this may be the cause of the intermittent dysuria.  I have encouraged him to get better control of his blood sugars.  I will send the urine for culture for completeness sake.  If he sees no improvement in the dysuria after getting better control with his blood sugars or the urine culture returns negative, I recommended that he undergo cystoscopic examination as he is a smoker and at high risk for bladder cancer.  He is agreeable to this plan. I have explained to the patient that they will  be scheduled for a cystoscopy in our office to evaluate their bladder.  The cystoscopy consists of passing a tube with a lens up through their urethra and into their urinary bladder.   We will inject the urethra with a lidocaine gel prior to introducing the cystoscope to help with any discomfort during the procedure.   After the procedure, they might experience blood in the urine and discomfort with urination.  This will abate after the first few voids.  I have  encouraged the patient to increase water intake  during this time.  Patient denies any allergies to lidocaine.   2.  Erectile dysfunction Explained to the patient that he needs to be sexually aroused in order for the Viagra to be effective.  I suggest he retake the Viagra during an arousal state versus "just to see if it works."    Follow-up pending urine culture results  Zara Council, PA-C  [facility] [address] [place]  [address], [place] [address] [phone]  

## 2020-01-03 NOTE — Patient Instructions (Addendum)
[name],   It was great talking to you today!  You need to take clopidogrel 75 mg ONE tablet daily now. Reduce metformin to 1 tablet twice daily.   Here is how you should fill your pill box:   Morning: - Aspirin 81 mg  - Clopidogrel 75 mg  - Iron-Vitamin C - Metformin XR 500 mg  - Varenicline 1 mg - Pantoprazole 40 mg   Evening: - Atorvastatin 40 mg - Metformin XR 500 mg  - Tamsulosin 0.4 mg  - Varenicline 1 mg   Call me with any questions!  Catie Darnelle Maffucci, PharmD [phone]  Visit Information  Goals Addressed              This Visit's Progress     Patient Stated   .  PharmD "I want to stay healthy" (pt-stated)        CARE PLAN ENTRY (see longitudinal plan of care for additional care plan information)  Current Barriers:  . Social, community, and financial barriers:  o Difficult time reviewing medication today, he does not use a pill box or any adherence methods. Keeps his medications in a bag together, pulls out and takes dialy.  . Diabetes: uncontrolled but significantly improved; complicated by chronic medical conditions including splenic infarct, tobacco abuse, HTN, HLD, most recent A1c 7.5% . Most recent eGFR: >60 mL/min . Current antihyperglycemic regimen: metformin XR [phone] mg QAM (reports diarrhea sometimes when he takes 1000 mg QAM), 500 mg QPM; Lantus 20 mg daily  . Current glucose readings:  o Fasting: 110-170s, generally >140 o 2 hour post meal: 115-260, generally >180 o Reports that 1 12 pack of soda now lasts him about 2 weeks (was drinking a 24 pack daily) . Cardiovascular risk reduction: o Current hypertensive regimen: metoprolol succinate 25 mg daily  o Current hyperlipidemia regimen: atorvastatin 40 mg daily; LDL now at goal <70 o Current antiplatelet regimen: ASA 81 mg daily + clopidogrel 150 mg daily (s/p splenic infarction) . Tobacco Abuse: Chanitx 1 mg BID, Still smoking ~ 1 PPD  . COPD: Dr. Patsey Berthold; Stiolto 2 puffs daily, though  notes he is not taking this medication daily . GERD: pantoprazole 40 mg daily - has run out, has not refilled . BPH: Dr. Bernardo Heater; tamsulosin 0.4 mg daily . Supplements: Vitron (iron + Vit C) - reports he needs a refill of this  Pharmacist Clinical Goal(s):  [name] Over the next 90 days, patient will work with PharmD and primary care provider to address optimized medication management  Interventions: . Comprehensive medication review performed, medication list updated in electronic medical record . Inter-disciplinary care team collaboration (see longitudinal plan of care) . Messaged [name], NP. Patient is far out enough from the event to reduce clopidogrel to 75 mg daily. [place] sending refill on that.  . Recommend reducing metformin XR to 500 mg BID as max tolerated dose w/o GI side effects. Will collaborate w/ PCP.  [name] Patient needs improved fasting and post prandial coverage, but in a glucose dependent manner to prevent hypoglycemia. Recommend initiation of GLP1, such as Ozempic. If Ozempic started, would reduce Lantus dose by ~1/3 to 13 units daily to reduce risk of hypoglycemia. Goal to maximize GLP1 and minimize/eliminate insulin. Will discuss w/ PCP . Mailing patient BID pill box w/ list of how to fill  . Patient requests refill on iron + vitamin C supplement. Will collaborate w/ PCP on this  Patient Self Care Activities:  . Patient will check blood glucose BID,  document, and provide at future appointments . Patient will continue to focus on reducing tobacco use.  . Patient will take medications as prescribed . Patient will report any questions or concerns to provider   Please see past updates related to this goal by clicking on the "Past Updates" button in the selected goal         The patient verbalized understanding of instructions provided today and agreed to receive a mailed copy of patient instruction and/or educational materials.  Plan:  - Scheduled f/u call in ~ 6  weeks  Catie Darnelle Maffucci, PharmD, [place], [place] Pharmacist [place] [phone]

## 2020-01-04 ENCOUNTER — Telehealth: Payer: Self-pay | Admitting: Nurse Practitioner

## 2020-01-04 ENCOUNTER — Other Ambulatory Visit: Payer: Self-pay

## 2020-01-04 ENCOUNTER — Encounter: Payer: Self-pay | Admitting: Urology

## 2020-01-04 ENCOUNTER — Ambulatory Visit (INDEPENDENT_AMBULATORY_CARE_PROVIDER_SITE_OTHER): Payer: Medicare HMO | Admitting: Urology

## 2020-01-04 VITALS — BP 117/78 | HR 70 | Ht 65.0 in | Wt 183.0 lb

## 2020-01-04 DIAGNOSIS — E1169 Type 2 diabetes mellitus with other specified complication: Secondary | ICD-10-CM

## 2020-01-04 DIAGNOSIS — N521 Erectile dysfunction due to diseases classified elsewhere: Secondary | ICD-10-CM

## 2020-01-04 DIAGNOSIS — R300 Dysuria: Secondary | ICD-10-CM

## 2020-01-04 LAB — URINALYSIS, COMPLETE
Bilirubin, UA: NEGATIVE
Leukocytes,UA: NEGATIVE
Nitrite, UA: NEGATIVE
Protein,UA: NEGATIVE
RBC, UA: NEGATIVE
Specific Gravity, UA: 1.025 (ref 1.005–1.030)
Urobilinogen, Ur: 1 mg/dL (ref 0.2–1.0)
pH, UA: 6 (ref 5.0–7.5)

## 2020-01-04 LAB — MICROSCOPIC EXAMINATION
Bacteria, UA: NONE SEEN
RBC, Urine: NONE SEEN /hpf (ref 0–2)

## 2020-01-04 LAB — BLADDER SCAN AMB NON-IMAGING: Scan Result: 19

## 2020-01-04 MED ORDER — OZEMPIC (0.25 OR 0.5 MG/DOSE) 2 MG/1.5ML ~~LOC~~ SOPN: PEN_INJECTOR | SUBCUTANEOUS | 1 refills | Status: DC

## 2020-01-04 MED ORDER — VITRON-C 65-125 MG PO TABS: 1.0000 | ORAL_TABLET | Freq: Every day | ORAL | 0 refills | Status: DC

## 2020-01-04 MED ORDER — METFORMIN HCL ER 500 MG PO TB24: 500.0000 mg | ORAL_TABLET | Freq: Two times a day (BID) | ORAL | 1 refills | Status: DC

## 2020-01-04 NOTE — Telephone Encounter (Signed)
Patient aware of below message and voiced understanding. Patient scheduled nurse visit for ozempic teaching on 01/06/20 at 3:30 pm.

## 2020-01-04 NOTE — Telephone Encounter (Signed)
Diabetes changes:  1. Ozempic 0.25 mg weekly x 4 and then increase to 0.50 mg weekly to follow.   2. He needs nurse visit -bring in his Ozempic pen - to be instructed on this new medication.   3.  Decrease the  Lantus 13 units daily when he begins the Ozempic.   4. He must monitor FBS daily and  2 hour PP BS  daily and record.   5. Metformin 500 XR BID - secondary to diarrhea.   6. Refill on iron vit C. - refill=-look into  level.   7. Continue on Chantix 1 ppd per day and keep lowering cigarettes down form 1.5 packs per day.

## 2020-01-06 ENCOUNTER — Other Ambulatory Visit: Payer: Self-pay

## 2020-01-06 ENCOUNTER — Ambulatory Visit: Payer: Medicare HMO

## 2020-01-07 LAB — CULTURE, URINE COMPREHENSIVE

## 2020-01-10 ENCOUNTER — Telehealth: Payer: Self-pay | Admitting: Family Medicine

## 2020-01-10 NOTE — Telephone Encounter (Signed)
-----   Message from [name], PA-C sent at 01/07/2020  9:41 AM EDT ----- Please let Mr. Ballengee know that his urine culture returned negative, so I recommend we proceed with cystoscopy with Dr. Bernardo Heater for dysuria.

## 2020-01-10 NOTE — Telephone Encounter (Signed)
Spoke with patient and scheduled CYSTO with Dr. Bernardo Heater.

## 2020-01-10 NOTE — Telephone Encounter (Signed)
LMOM for patient to return call to schedule for Cysto.

## 2020-01-13 NOTE — Progress Notes (Signed)
   01/14/2020  CC:  Chief Complaint  Patient presents with  . Cysto    [name] is a 54 y.o. male who returns for a cystoscopy.    Refer to [employer] note from 01/04/2020 for details.    Blood pressure 117/81, pulse (!) 121, height 5\' 9"  (1.753 m), weight 183 lb (83 kg). NED. A&Ox3.   No respiratory distress   Abd soft, NT, ND Normal phallus with bilateral descended testicles  Cystoscopy Procedure Note  Patient identification was confirmed, informed consent was obtained, and patient was prepped using Betadine solution.  Lidocaine jelly was administered per urethral meatus.     Pre-Procedure: - Inspection reveals a normal caliber urethral meatus.  Procedure: The flexible cystoscope was introduced without difficulty - No urethral strictures/lesions are present. - Prominent lateral lobe enlargement of prostate -Moderate bladder neck elevation.  - Bilateral ureteral orifices identified - Bladder mucosa  reveals no ulcers, tumors, or lesions - No bladder stones - No trabeculation  Retroflexion shows no abnormalities.    Post-Procedure: - Patient tolerated the procedure well  Assessment/ Plan:  1. BPH  Cystoscopy shows no finding to identify an etiology of his dysuria  Patient states dysuria is occasional and not bothersome   Tamsulosin refill was sent.  Follow up with Zara Council, PA-C in 6 months.   Fransico Him, am acting as a scribe for Dr. Nicki Reaper C. Hardeep Reetz,  I have reviewed the above documentation for accuracy and completeness, and I agree with the above.   [name], MD

## 2020-01-14 ENCOUNTER — Encounter: Payer: Self-pay | Admitting: Urology

## 2020-01-14 ENCOUNTER — Ambulatory Visit (INDEPENDENT_AMBULATORY_CARE_PROVIDER_SITE_OTHER): Payer: Medicare HMO | Admitting: Urology

## 2020-01-14 ENCOUNTER — Other Ambulatory Visit: Payer: Self-pay

## 2020-01-14 VITALS — BP 117/81 | HR 121 | Ht 69.0 in | Wt 183.0 lb

## 2020-01-14 DIAGNOSIS — N3943 Post-void dribbling: Secondary | ICD-10-CM

## 2020-01-14 DIAGNOSIS — R300 Dysuria: Secondary | ICD-10-CM

## 2020-01-14 DIAGNOSIS — N401 Enlarged prostate with lower urinary tract symptoms: Secondary | ICD-10-CM

## 2020-01-14 LAB — MICROSCOPIC EXAMINATION
Bacteria, UA: NONE SEEN
Epithelial Cells (non renal): NONE SEEN /hpf (ref 0–10)

## 2020-01-14 LAB — URINALYSIS, COMPLETE
Bilirubin, UA: NEGATIVE
Glucose, UA: NEGATIVE
Ketones, UA: NEGATIVE
Leukocytes,UA: NEGATIVE
Nitrite, UA: NEGATIVE
Protein,UA: NEGATIVE
RBC, UA: NEGATIVE
Specific Gravity, UA: >1.03 — ABNORMAL HIGH (ref 1.005–1.030)
Urobilinogen, Ur: 1 mg/dL (ref 0.2–1.0)
pH, UA: 6.5 (ref 5.0–7.5)

## 2020-01-16 ENCOUNTER — Telehealth: Payer: Self-pay | Admitting: Nurse Practitioner

## 2020-01-16 ENCOUNTER — Encounter: Payer: Self-pay | Admitting: Urology

## 2020-01-16 MED ORDER — TAMSULOSIN HCL 0.4 MG PO CAPS
0.4000 mg | ORAL_CAPSULE | Freq: Every day | ORAL | 3 refills | Status: DC
Start: 2020-01-16 — End: 2020-06-16

## 2020-01-16 NOTE — Telephone Encounter (Signed)
Has he started on taking the Ozempic [place] injection weekly, yet?

## 2020-01-17 ENCOUNTER — Other Ambulatory Visit (INDEPENDENT_AMBULATORY_CARE_PROVIDER_SITE_OTHER): Payer: Self-pay | Admitting: Nurse Practitioner

## 2020-01-17 DIAGNOSIS — D735 Infarction of spleen: Secondary | ICD-10-CM

## 2020-01-17 DIAGNOSIS — Z95820 Peripheral vascular angioplasty status with implants and grafts: Secondary | ICD-10-CM

## 2020-01-17 NOTE — Telephone Encounter (Signed)
Patient has started the ozempic and states he is doing well. Patient has a follow up appt on 01/21/20

## 2020-01-18 ENCOUNTER — Other Ambulatory Visit: Payer: Self-pay

## 2020-01-18 ENCOUNTER — Ambulatory Visit (INDEPENDENT_AMBULATORY_CARE_PROVIDER_SITE_OTHER): Payer: Medicare HMO | Admitting: Vascular Surgery

## 2020-01-18 ENCOUNTER — Ambulatory Visit (INDEPENDENT_AMBULATORY_CARE_PROVIDER_SITE_OTHER): Payer: Medicare HMO

## 2020-01-18 ENCOUNTER — Encounter (INDEPENDENT_AMBULATORY_CARE_PROVIDER_SITE_OTHER): Payer: Self-pay | Admitting: Vascular Surgery

## 2020-01-18 VITALS — BP 113/74 | HR 105 | Ht 64.0 in | Wt 181.0 lb

## 2020-01-18 DIAGNOSIS — I10 Essential (primary) hypertension: Secondary | ICD-10-CM

## 2020-01-18 DIAGNOSIS — Z95820 Peripheral vascular angioplasty status with implants and grafts: Secondary | ICD-10-CM

## 2020-01-18 DIAGNOSIS — D735 Infarction of spleen: Secondary | ICD-10-CM

## 2020-01-18 DIAGNOSIS — I748 Embolism and thrombosis of other arteries: Secondary | ICD-10-CM

## 2020-01-18 DIAGNOSIS — E1142 Type 2 diabetes mellitus with diabetic polyneuropathy: Secondary | ICD-10-CM | POA: Diagnosis not present

## 2020-01-18 NOTE — Progress Notes (Signed)
MRN : [id number]  Benjamin Conley is a 54 y.o. (10/11/1965) male who presents with chief complaint of  Chief Complaint  Patient presents with  . Follow-up    50mo U/S follow up  .  History of Present Illness: Patient returns today in follow up of his splenic artery stent placed approximately 6 months ago by my partner.  He is currently doing well.  No significant abdominal pain or signs of peripheral embolization currently.  No unintentional weight loss.  No food fear.  Duplex today shows a patent splenic artery stent with normal velocities in the SMA and celiac arteries.  Current Outpatient Medications  Medication Sig Dispense Refill  . albuterol (VENTOLIN HFA) 108 (90 Base) MCG/ACT inhaler Inhale 2 puffs into the lungs every 6 (six) hours as needed for wheezing or shortness of breath. 6.7 g 3  . aspirin EC 81 MG EC tablet Take 1 tablet (81 mg total) by mouth daily. 90 tablet 1  . atorvastatin (LIPITOR) 40 MG tablet Take 1 tablet (40 mg total) by mouth daily. 90 tablet 1  . clopidogrel (PLAVIX) 75 MG tablet Take 1 tablet (75 mg total) by mouth daily. 90 tablet 3  . glucose blood (RELION TRUE METRIX TEST STRIPS) test strip Use as instructed to check blood sugars twice daily. Dx E11.9 200 each 12  . insulin glargine (LANTUS) 100 UNIT/ML Solostar Pen Inject 20 Units into the skin at bedtime. 15 mL 11  . Iron-Vitamin C (VITRON-C) 65-125 MG TABS Take 1 tablet by mouth daily. 60 tablet 0  . metFORMIN (GLUCOPHAGE-XR) 500 MG 24 hr tablet Take 1 tablet (500 mg total) by mouth 2 (two) times daily after a meal. 270 tablet 1  . metoprolol succinate (TOPROL XL) 25 MG 24 hr tablet Take 1 tablet (25 mg total) by mouth daily. 90 tablet 3  . pantoprazole (PROTONIX) 40 MG tablet Take 1 tablet (40 mg total) by mouth daily. 30 tablet 3  . Semaglutide,0.25 or 0.5MG /DOS, (OZEMPIC, 0.25 OR 0.5 MG/DOSE,) 2 MG/1.5ML SOPN Inject 0.25 mg into subcutaneous skin once a week for 4 weeks and if tolerated increase the  dose to 0.50 mg subcutanous once a week. 1.5 mL 1  . sildenafil (VIAGRA) 100 MG tablet Take 1 tablet (100 mg total) by mouth daily as needed for erectile dysfunction. 30 tablet 0  . tamsulosin (FLOMAX) 0.4 MG CAPS capsule Take 1 capsule (0.4 mg total) by mouth daily. 90 capsule 3  . Tiotropium Bromide-Olodaterol (STIOLTO RESPIMAT) 2.5-2.5 MCG/ACT AERS Inhale 2 puffs into the lungs daily. 1 each 0  . varenicline (APO-VARENICLINE) 1 MG tablet Take 1 tablet (1 mg total) by mouth 2 (two) times daily. Take 0.5 mg daily x 3 days, then 0.5 mg BID x 4 days, then 1 mg BID thereafter 56 tablet 3   No current facility-administered medications for this visit.    Past Medical History:  Diagnosis Date  . Acute posthemorrhagic anemia   . Complication of anesthesia    c/o difficulty breathing after anesthesia  . COPD (chronic obstructive pulmonary disease) ([place])   . Diabetes mellitus without complication ([place])   . Hyperlipemia   . Melena 08/21/2019  . Paroxysmal supraventricular tachycardia ([place]) 08/19/2019  . Postural dizziness with presyncope 08/21/2019  . 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 Tobacco abuse   . Upper GI bleed 08/21/2019    Past Surgical History:  Procedure Laterality Date  . BACK SURGERY     lumbar  . COLONOSCOPY WITH PROPOFOL N/A  08/24/2019   Procedure: COLONOSCOPY WITH PROPOFOL;  Surgeon: [name], MD;  Location: [facility] ENDOSCOPY;  Service: Endoscopy;  Laterality: N/A;  . ESOPHAGOGASTRODUODENOSCOPY (EGD) WITH PROPOFOL N/A 08/24/2019   Procedure: ESOPHAGOGASTRODUODENOSCOPY (EGD) WITH PROPOFOL;  Surgeon: [name], MD;  Location: [facility] ENDOSCOPY;  Service: Endoscopy;  Laterality: N/A;  . STENT PLACEMENT VASCULAR ([place] [facility])  07/2019   stenosis of distal splenic artery and stent placed  . VISCERAL ANGIOGRAPHY N/A 08/06/2019   Procedure: VISCERAL ANGIOGRAPHY;  Surgeon: [name], MD;  Location: [place] [facility] LAB;  Service: Cardiovascular;  Laterality: N/A;     Social History    Tobacco Use  . Smoking status: Current Every Day Smoker    Packs/day: 1.00    Years: 48.00    Pack years: 48.00    Types: Cigarettes  . Smokeless tobacco: Never Used  Vaping Use  . Vaping Use: Never used  Substance Use Topics  . Alcohol use: No    Comment: Never been a problem  . Drug use: No     Family History  Problem Relation Age of Onset  . Diabetes Mellitus II Mother   . Diabetes Mellitus II Brother   . Hyperlipidemia Brother   . Colon cancer Maternal Aunt   . Colon cancer Maternal Uncle     Allergies  Allergen Reactions  . Wasp Venom Anaphylaxis  . Wasp Venom Protein Anaphylaxis  . Coffee Flavor Nausea And Vomiting    Patient states coffee gives him nausea and stomach cramps  . Onion      REVIEW OF SYSTEMS (Negative unless checked)  Constitutional: [] Weight loss  [] Fever  [] Chills Cardiac: [] Chest pain   [] Chest pressure   [] Palpitations   [] Shortness of breath when laying flat   [] Shortness of breath at rest   [] Shortness of breath with exertion. Vascular:  [] Pain in legs with walking   [] Pain in legs at rest   [] Pain in legs when laying flat   [] Claudication   [] Pain in feet when walking  [] Pain in feet at rest  [] Pain in feet when laying flat   [] History of DVT   [] Phlebitis   [] Swelling in legs   [] Varicose veins   [] Non-healing ulcers Pulmonary:   [] Uses home oxygen   [] Productive cough   [] Hemoptysis   [] Wheeze  [] COPD   [] Asthma Neurologic:  [] Dizziness  [] Blackouts   [] Seizures   [] History of stroke   [] History of TIA  [] Aphasia   [] Temporary blindness   [] Dysphagia   [] Weakness or numbness in arms   [] Weakness or numbness in legs Musculoskeletal:  [x] Arthritis   [] Joint swelling   [] Joint pain   [] Low back pain Hematologic:  [] Easy bruising  [] Easy bleeding   [] Hypercoagulable state   [] Anemic   Gastrointestinal:  [] Blood in stool   [] Vomiting blood  [x] Gastroesophageal reflux/heartburn   [] Abdominal pain Genitourinary:  [] Chronic kidney disease    [] Difficult urination  [] Frequent urination  [] Burning with urination   [] Hematuria Skin:  [] Rashes   [] Ulcers   [] Wounds Psychological:  [] History of anxiety   []  History of major depression.  Physical Examination  BP 113/74   Pulse (!) 105   Ht 5\' 4"  (1.626 m)   Wt 181 lb (82.1 kg)   BMI 31.07 kg/m  Gen:  WD/WN, NAD. Somewhat disheveled Head: [place]/AT, No temporalis wasting. Ear/Nose/Throat: Hearing grossly intact, nares w/o erythema or drainage Eyes: Conjunctiva clear. Sclera non-icteric Neck: Supple.  Trachea midline Pulmonary:  Good air movement, no use of accessory  muscles.  Cardiac: RRR, no JVD Vascular:  Vessel Right Left  Radial Palpable Palpable       Gastrointestinal: soft, non-tender/non-distended. No guarding/reflex.  Musculoskeletal: M/S 5/5 throughout.  No deformity or atrophy. No edema. Neurologic: Sensation grossly intact in extremities.  Symmetrical.  Speech is fluent.  Psychiatric: Judgment intact, Mood & affect appropriate for pt's clinical situation. Dermatologic: No rashes or ulcers noted.  No cellulitis or open wounds.       Labs Recent Results (from the past 2160 hour(s))  PSA     Status: None   Collection Time: 10/21/19  2:32 PM  Result Value Ref Range   PSA 2.21 0.10 - 4.00 ng/mL    Comment: Test performed using Access Hybritech PSA Assay, a parmagnetic partical, chemiluminecent immunoassay.  Urinalysis, Routine w reflex microscopic     Status: Abnormal   Collection Time: 10/21/19  2:32 PM  Result Value Ref Range   Color, Urine YELLOW Yellow;Lt. Yellow;Straw;Dark Yellow;Amber;Green;Red;Brown   APPearance CLEAR Clear;Turbid;Slightly Cloudy;Cloudy   Specific Gravity, Urine 1.015 1.000 - 1.030   pH 7.5 5.0 - 8.0   Total Protein, Urine NEGATIVE Negative   Urine Glucose 250 (A) Negative   Ketones, ur NEGATIVE Negative   Bilirubin Urine NEGATIVE Negative   Hgb urine dipstick NEGATIVE Negative   Urobilinogen, UA 1.0 0.0 - 1.0   Leukocytes,Ua  NEGATIVE Negative   Nitrite NEGATIVE Negative   WBC, UA 0-2/hpf 0-2/hpf   RBC / HPF none seen 0-2/hpf   Squamous Epithelial / LPF Rare(0-4/hpf) Rare(0-4/hpf)  Urine Culture     Status: None   Collection Time: 10/21/19  2:32 PM   Specimen: Urine  Result Value Ref Range   MICRO NUMBER: [id number]    SPECIMEN QUALITY: Adequate    Sample Source NOT GIVEN    STATUS: FINAL    Result: No Growth   Basic metabolic panel     Status: Abnormal   Collection Time: 10/21/19  2:32 PM  Result Value Ref Range   Sodium 140 135 - 145 mEq/L   Potassium 4.0 3.5 - 5.1 mEq/L   Chloride 103 96 - 112 mEq/L   CO2 30 19 - 32 mEq/L   Glucose, Bld 148 (H) 70 - 99 mg/dL   BUN 11 6 - 23 mg/dL   Creatinine, Ser 0.70 0.40 - 1.50 mg/dL   GFR 117.52 >60.00 mL/min   Calcium 9.4 8.4 - 10.5 mg/dL  B12 and Folate Panel     Status: Abnormal   Collection Time: 10/21/19  2:32 PM  Result Value Ref Range   Vitamin B-12 140 (L) 211 - 911 pg/mL   Folate 10.2 >5.9 ng/mL  Lipid Profile     Status: Abnormal   Collection Time: 10/21/19  2:32 PM  Result Value Ref Range   Cholesterol 130 0 - 200 mg/dL    Comment: ATP III Classification       Desirable:  < 200 mg/dL               Borderline High:  200 - 239 mg/dL          High:  > = 240 mg/dL   Triglycerides 142.0 0 - 149 mg/dL    Comment: Normal:  <150 mg/dLBorderline High:  150 - 199 mg/dL   HDL 35.60 (L) >39.00 mg/dL   VLDL 28.4 0.0 - 40.0 mg/dL   LDL Cholesterol 66 0 - 99 mg/dL   Total CHOL/HDL Ratio 4     Comment:  Men          Women1/2 Average Risk     3.4          3.3Average Risk          5.0          4.42X Average Risk          9.6          7.13X Average Risk          15.0          11.0                       NonHDL 94.80     Comment: NOTE:  Non-HDL goal should be 30 mg/dL higher than patient's LDL goal (i.e. LDL goal of < 70 mg/dL, would have non-HDL goal of < 100 mg/dL)  Hemoglobin A1c     Status: Abnormal   Collection Time: 10/21/19  2:35 PM  Result  Value Ref Range   Hgb A1c MFr Bld 7.5 (H) 4.6 - 6.5 %    Comment: Glycemic Control Guidelines for People with Diabetes:Non Diabetic:  <6%Goal of Therapy: <7%Additional Action Suggested:  >8%   CBC with Differential/Platelet     Status: Abnormal   Collection Time: 11/10/19  3:23 PM  Result Value Ref Range   WBC 10.3 4.0 - 10.5 K/uL   RBC 3.88 (L) 4.22 - 5.81 Mil/uL   Hemoglobin 11.9 (L) 13.0 - 17.0 g/dL   HCT 36.1 (L) 39 - 52 %   MCV 93.1 78.0 - 100.0 fl   MCHC 33.1 30.0 - 36.0 g/dL   RDW 15.9 (H) 11.5 - 15.5 %   Platelets 452.0 (H) 150 - 400 K/uL   Neutrophils Relative % 55.4 43 - 77 %   Lymphocytes Relative 34.6 12 - 46 %   Monocytes Relative 7.8 3 - 12 %   Eosinophils Relative 1.3 0 - 5 %   Basophils Relative 0.9 0 - 3 %   Neutro Abs 5.7 1.4 - 7.7 K/uL   Lymphs Abs 3.6 0.7 - 4.0 K/uL   Monocytes Absolute 0.8 0.1 - 1.0 K/uL   Eosinophils Absolute 0.1 0.0 - 0.7 K/uL   Basophils Absolute 0.1 0.0 - 0.1 K/uL  Iron and TIBC     Status: None   Collection Time: 12/30/19 12:59 PM  Result Value Ref Range   Iron 85 45 - 182 ug/dL   TIBC 445 250 - 450 ug/dL   Saturation Ratios 19 17.9 - 39.5 %   UIBC 360 ug/dL    Comment: Performed at [facility], [address] [place], [place] [address]  Ferritin     Status: Abnormal   Collection Time: 12/30/19 12:59 PM  Result Value Ref Range   Ferritin 17 (L) 24 - 336 ng/mL    Comment: Performed at [facility], [address] [place], [place] [address]  CBC with Differential/Platelet     Status: Abnormal   Collection Time: 12/30/19 12:59 PM  Result Value Ref Range   WBC 9.8 4.0 - 10.5 K/uL   RBC 4.46 4.22 - 5.81 MIL/uL   Hemoglobin 13.4 13.0 - 17.0 g/dL   HCT 39.6 39 - 52 %   MCV 88.8 80.0 - 100.0 fL   MCH 30.0 26.0 - 34.0 pg   MCHC 33.8 30.0 - 36.0 g/dL   RDW 13.5 11.5 - 15.5 %   Platelets 442 (H) 150 - 400 K/uL  nRBC 0.0 0.0 - 0.2 %   Neutrophils Relative % 55 %   Neutro Abs 5.3 1.7 - 7.7 K/uL   Lymphocytes  Relative 37 %   Lymphs Abs 3.6 0.7 - 4.0 K/uL   Monocytes Relative 6 %   Monocytes Absolute 0.6 0.1 - 1.0 K/uL   Eosinophils Relative 1 %   Eosinophils Absolute 0.1 0.0 - 0.5 K/uL   Basophils Relative 1 %   Basophils Absolute 0.1 0.0 - 0.1 K/uL   Immature Granulocytes 0 %   Abs Immature Granulocytes 0.04 0.00 - 0.07 K/uL    Comment: Performed at [facility], [address] [place], [place] [address]  Urinalysis, Complete     Status: Abnormal   Collection Time: 01/04/20  1:24 PM  Result Value Ref Range   Specific Gravity, UA 1.025 1.005 - 1.030   pH, UA 6.0 5.0 - 7.5   Color, UA Yellow Yellow   Appearance Ur Hazy (A) Clear   Leukocytes,UA Negative Negative   Protein,UA Negative Negative/Trace   Glucose, UA 3+ (A) Negative   Ketones, UA Trace (A) Negative   RBC, UA Negative Negative   Bilirubin, UA Negative Negative   Urobilinogen, Ur 1.0 0.2 - 1.0 mg/dL   Nitrite, UA Negative Negative   Microscopic Examination See below:   Microscopic Examination     Status: None   Collection Time: 01/04/20  1:24 PM   Urine  Result Value Ref Range   WBC, UA 0-5 0 - 5 /hpf   RBC None seen 0 - 2 /hpf   Epithelial Cells (non renal) 0-10 0 - 10 /hpf   Bacteria, UA None seen None seen/Few  Bladder Scan (Post Void Residual) in office     Status: None   Collection Time: 01/04/20  1:48 PM  Result Value Ref Range   Scan Result 19   CULTURE, URINE COMPREHENSIVE     Status: None   Collection Time: 01/04/20  2:01 PM   Specimen: Urine   UR  Result Value Ref Range   Urine Culture, Comprehensive Final report    Organism ID, Bacteria Comment     Comment: Mixed urogenital flora 10,000-25,000 colony forming units per mL   Urinalysis, Complete     Status: Abnormal   Collection Time: 01/14/20 10:57 AM  Result Value Ref Range   Specific Gravity, UA >1.030 (H) 1.005 - 1.030   pH, UA 6.5 5.0 - 7.5   Color, UA Yellow Yellow   Appearance Ur Clear Clear   Leukocytes,UA Negative Negative    Protein,UA Negative Negative/Trace   Glucose, UA Negative Negative   Ketones, UA Negative Negative   RBC, UA Negative Negative   Bilirubin, UA Negative Negative   Urobilinogen, Ur 1.0 0.2 - 1.0 mg/dL   Nitrite, UA Negative Negative   Microscopic Examination See below:   Microscopic Examination     Status: Abnormal   Collection Time: 01/14/20 10:57 AM   Urine  Result Value Ref Range   WBC, UA 0-5 0 - 5 /hpf   RBC 0-2 0 - 2 /hpf   Epithelial Cells (non renal) None seen 0 - 10 /hpf   Mucus, UA Present (A) Not Estab.   Bacteria, UA None seen None seen/Few    Radiology No results found.  Assessment/Plan  Diabetic neuropathy (HCC) blood glucose control important in reducing the progression of atherosclerotic disease. Also, involved in wound healing. On appropriate medications.   Hypertension blood pressure control important in reducing the progression of  atherosclerotic disease. On appropriate oral medications.   Thrombosis of splenic artery (HCC) Duplex today shows a patent splenic artery stent with normal velocities in the SMA and celiac arteries.  He is doing well.  He is almost 6 months status post intervention.  We will stretch out his follow-up and plan to see him in 6 months or sooner if problems develop in the interim.    Leotis Pain, MD  01/18/2020 9:57 AM    This note was created with Dragon medical transcription system.  Any errors from dictation are purely unintentional

## 2020-01-18 NOTE — Assessment & Plan Note (Signed)
Duplex today shows a patent splenic artery stent with normal velocities in the SMA and celiac arteries.  He is doing well.  He is almost 6 months status post intervention.  We will stretch out his follow-up and plan to see him in 6 months or sooner if problems develop in the interim.

## 2020-01-18 NOTE — Assessment & Plan Note (Signed)
blood glucose control important in reducing the progression of atherosclerotic disease. Also, involved in wound healing. On appropriate medications.  

## 2020-01-18 NOTE — Assessment & Plan Note (Signed)
blood pressure control important in reducing the progression of atherosclerotic disease. On appropriate oral medications.  

## 2020-01-21 ENCOUNTER — Other Ambulatory Visit (INDEPENDENT_AMBULATORY_CARE_PROVIDER_SITE_OTHER): Payer: Medicare HMO

## 2020-01-21 ENCOUNTER — Other Ambulatory Visit: Payer: Self-pay

## 2020-01-21 ENCOUNTER — Encounter: Payer: Self-pay | Admitting: Nurse Practitioner

## 2020-01-21 ENCOUNTER — Telehealth (INDEPENDENT_AMBULATORY_CARE_PROVIDER_SITE_OTHER): Payer: Medicare HMO | Admitting: Nurse Practitioner

## 2020-01-21 VITALS — BP 147/90 | HR 115 | Temp 97.5°F | Ht 64.0 in | Wt 181.0 lb

## 2020-01-21 DIAGNOSIS — Z720 Tobacco use: Secondary | ICD-10-CM

## 2020-01-21 DIAGNOSIS — E785 Hyperlipidemia, unspecified: Secondary | ICD-10-CM | POA: Diagnosis not present

## 2020-01-21 DIAGNOSIS — E119 Type 2 diabetes mellitus without complications: Secondary | ICD-10-CM

## 2020-01-21 DIAGNOSIS — K921 Melena: Secondary | ICD-10-CM

## 2020-01-21 DIAGNOSIS — E1165 Type 2 diabetes mellitus with hyperglycemia: Secondary | ICD-10-CM | POA: Diagnosis not present

## 2020-01-21 DIAGNOSIS — Z95828 Presence of other vascular implants and grafts: Secondary | ICD-10-CM | POA: Diagnosis not present

## 2020-01-21 DIAGNOSIS — E1140 Type 2 diabetes mellitus with diabetic neuropathy, unspecified: Secondary | ICD-10-CM

## 2020-01-21 DIAGNOSIS — E538 Deficiency of other specified B group vitamins: Secondary | ICD-10-CM | POA: Diagnosis not present

## 2020-01-21 DIAGNOSIS — IMO0002 Reserved for concepts with insufficient information to code with codable children: Secondary | ICD-10-CM

## 2020-01-21 DIAGNOSIS — Z0189 Encounter for other specified special examinations: Secondary | ICD-10-CM | POA: Diagnosis not present

## 2020-01-21 NOTE — Progress Notes (Signed)
Virtual Visit via Video Note  This visit type was conducted due to national recommendations for restrictions regarding the COVID-19 pandemic (e.g. social distancing).  This format is felt to be most appropriate for this patient at this time.  All issues noted in this document were discussed and addressed.  No physical exam was performed (except for noted visual exam findings with Video Visits).   I connected with@ on 01/23/20 at 10:30 AM EST by a video enabled telemedicine application or telephone and verified that I am speaking with the correct person using two identifiers. Location patient: home Location provider: work or home office Persons participating in the virtual visit: patient, provider  I discussed the limitations, risks, security and privacy concerns of performing an evaluation and management service by telephone and the availability of in person appointments. I also discussed with the patient that there may be a patient responsible charge related to this service. The patient expressed understanding and agreed to proceed.   Reason for visit: 71-month follow-up.  No concerns today.  He does mention he still gets ringing in his ears.  HPI: This 54 year old patient who established care on 08/11/2019 with history of DM uncontrolled with neuropathy, HLD, HTN, Inappropriate sinus tachycardia with  Echo EF 55-60%, lexiscan no evidence ischemia followed by Cardiology , COPD, tobacco abuse on Chantix,  GERD, BPH followed by Urology, B12 def, IDA, thrombocytosis hx IV iron followed by Hematology,  melena with acute hemorrhagic anemia with AVMs found on EGD and colonoscopy, splenic infarct with stent, on 81 mg aspirin and Plavix 75 mg daily followed by Vascular, chronic tinnitus and hearing loss followed by ENT,  history of dizziness, headache, abnormal MRI, followed by Neurology. He is followed by [facility]. Peripheral neuropathy followed by Podiatry.   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  T2DM uncontrolled with peripheral neuropathy: Hgb A1c 11.3> 7.5 on 10/21/19. GFR: 117.  Taking Metformin 500 mg twice daily.  Cannot tolerate higher doses with cause diarrhea.  He was started on Ozempic 0.25 mg weekly x4 (01/04/20) and then will  increase to 0.5 mg [place] weekly x4 if tolerated.  He has cut his Lantus to 13 units per day after getting some lows. Reports  fasting blood sugar 110-170 and PP >180. He feels well on this regimen. Diabetic foot exam per Podiatry 09/06/2019 and DP and PT pulses present but Lorenda Cahill- Weinstein monofilament wire test absent bilaterally. Needs q 3 mos Podiatry visits as recommended by Dr. Gardiner Barefoot in Podiatry. No record of eye exam and has been advised.  HTN/inappropriate sinus tachycardia: Metoprolol succinate (Toprol-XL 25 mg daily Per pt home readings today - BP repeated at 137/84- HR 113  BP Readings from Last 3 Encounters:  01/21/20 (!) 147/90  01/18/20 113/74  01/14/20 117/81    Pulse Readings from Last 3 Encounters:  01/21/20 (!) 115  01/18/20 (!) 105  01/14/20 (!) 121    HLD: Atorvastatin 40 mg daily Splenic infarct: Saw Dr. Lucky Cowboy in Vascular 01/18/2020 and Duplex shows patent splenic artery stent with normal  velocities in the SMA and celiac arteries.  Remains on aspirin 81 mg daily, clopidogrel 75 mg daily  Nicotine dependence: Smoking now down to 1 pack/day.  Has been smoking since he was 54 years old.  He is taking the Chantix every day.  He has tried the nicotine gum in the past and cannot tolerate the taste. He has not tried the lozenges.  He has  not tried the patch.  He would be willing to try the patch but he does not think he can stop smoking cigarettes.  He would be willing to have behavioral therapy help him with smoking cessation.  BPH: Flomax 0.4 mg daily, Viagra 100 mg daily as needed.  Recent UA done and he denies any hematuria or burning.  The Flomax helps with frequency.   History of AVMs found on EGD  colonoscopy: He remains on Protonix 40 mg daily reports he passed black stool with diarrhea on Tuesday/Wed x1.  No bowel movement today.  No NSAID use.  No abdominal pain cramping or bloating.  No nausea or vomiting.  Appetite and diet are normal.  He is also due for routine immunizations Pneumonia, COVID, Flu, Shingles which he declines.  ROS: See pertinent positives and negatives per HPI.  Past Medical History:  Diagnosis Date  . Acute posthemorrhagic anemia   . Complication of anesthesia    c/o difficulty breathing after anesthesia  . COPD (chronic obstructive pulmonary disease) ([place])   . Diabetes mellitus without complication ([place])   . Hyperlipemia   . Melena 08/21/2019  . Paroxysmal supraventricular tachycardia ([place]) 08/19/2019  . Postural dizziness with presyncope 08/21/2019  . 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 Tobacco abuse   . Upper GI bleed 08/21/2019    Past Surgical History:  Procedure Laterality Date  . BACK SURGERY     lumbar  . COLONOSCOPY WITH PROPOFOL N/A 08/24/2019   Procedure: COLONOSCOPY WITH PROPOFOL;  Surgeon: [name], MD;  Location: [facility] ENDOSCOPY;  Service: Endoscopy;  Laterality: N/A;  . ESOPHAGOGASTRODUODENOSCOPY (EGD) WITH PROPOFOL N/A 08/24/2019   Procedure: ESOPHAGOGASTRODUODENOSCOPY (EGD) WITH PROPOFOL;  Surgeon: [name], MD;  Location: [facility] ENDOSCOPY;  Service: Endoscopy;  Laterality: N/A;  . STENT PLACEMENT VASCULAR ([place] [facility])  07/2019   stenosis of distal splenic artery and stent placed  . VISCERAL ANGIOGRAPHY N/A 08/06/2019   Procedure: VISCERAL ANGIOGRAPHY;  Surgeon: [name], MD;  Location: [place] [facility] LAB;  Service: Cardiovascular;  Laterality: N/A;    Family History  Problem Relation Age of Onset  . Diabetes Mellitus II Mother   . Diabetes Mellitus II Brother   . Hyperlipidemia Brother   . Colon cancer Maternal Aunt   . Colon cancer Maternal Uncle     SOCIAL HX: Tobacco- see above   Current Outpatient Medications:  .   albuterol (VENTOLIN HFA) 108 (90 Base) MCG/ACT inhaler, Inhale 2 puffs into the lungs every 6 (six) hours as needed for wheezing or shortness of breath., Disp: 6.7 g, Rfl: 3 .  aspirin EC 81 MG EC tablet, Take 1 tablet (81 mg total) by mouth daily., Disp: 90 tablet, Rfl: 1 .  atorvastatin (LIPITOR) 40 MG tablet, Take 1 tablet (40 mg total) by mouth daily., Disp: 90 tablet, Rfl: 1 .  clopidogrel (PLAVIX) 75 MG tablet, Take 1 tablet (75 mg total) by mouth daily., Disp: 90 tablet, Rfl: 3 .  glucose blood (RELION TRUE METRIX TEST STRIPS) test strip, Use as instructed to check blood sugars twice daily. Dx E11.9, Disp: 200 each, Rfl: 12 .  insulin glargine (LANTUS) 100 UNIT/ML Solostar Pen, Inject 20 Units into the skin at bedtime., Disp: 15 mL, Rfl: 11 .  Iron-Vitamin C (VITRON-C) 65-125 MG TABS, Take 1 tablet by mouth daily., Disp: 60 tablet, Rfl: 0 .  metFORMIN (GLUCOPHAGE-XR) 500 MG 24 hr tablet, Take 1 tablet (500 mg total) by mouth 2 (two) times daily after a meal., Disp: 536  tablet, Rfl: 1 .  metoprolol succinate (TOPROL XL) 25 MG 24 hr tablet, Take 1 tablet (25 mg total) by mouth daily., Disp: 90 tablet, Rfl: 3 .  pantoprazole (PROTONIX) 40 MG tablet, Take 1 tablet (40 mg total) by mouth daily., Disp: 30 tablet, Rfl: 3 .  Semaglutide,0.25 or 0.5MG /DOS, (OZEMPIC, 0.25 OR 0.5 MG/DOSE,) 2 MG/1.5ML SOPN, Inject 0.25 mg into subcutaneous skin once a week for 4 weeks and if tolerated increase the dose to 0.50 mg subcutanous once a week., Disp: 1.5 mL, Rfl: 1 .  sildenafil (VIAGRA) 100 MG tablet, Take 1 tablet (100 mg total) by mouth daily as needed for erectile dysfunction., Disp: 30 tablet, Rfl: 0 .  tamsulosin (FLOMAX) 0.4 MG CAPS capsule, Take 1 capsule (0.4 mg total) by mouth daily., Disp: 90 capsule, Rfl: 3 .  Tiotropium Bromide-Olodaterol (STIOLTO RESPIMAT) 2.5-2.5 MCG/ACT AERS, Inhale 2 puffs into the lungs daily., Disp: 1 each, Rfl: 0 .  varenicline (APO-VARENICLINE) 1 MG tablet, Take 1 tablet (1  mg total) by mouth 2 (two) times daily. Take 0.5 mg daily x 3 days, then 0.5 mg BID x 4 days, then 1 mg BID thereafter, Disp: 56 tablet, Rfl: 3 .  Vitamin D, Ergocalciferol, (DRISDOL) 1.25 MG (50000 UNIT) CAPS capsule, Take 1 capsule (50,000 Units total) by mouth every 7 (seven) days., Disp: 8 capsule, Rfl: 0  EXAM:  VITALS per patient if applicable: Weight 277, temp 97.5 pulse 115 blood pressure 147/92 137/84  GENERAL: alert, oriented, appears well and in no acute distress  HEENT: atraumatic, conjunctiva clear, no obvious abnormalities on inspection of external nose and ears  NECK: normal movements of the head and neck  LUNGS: on inspection no signs of respiratory distress, breathing rate appears normal, no obvious gross SOB, gasping or wheezing  CV: no obvious cyanosis  MS: moves all visible extremities without noticeable abnormality  PSYCH/NEURO: pleasant and cooperative, no obvious depression or anxiety, speech and thought processing grossly intact  ASSESSMENT AND PLAN:  Discussed the following assessment and plan:  DM type 2, uncontrolled, with neuropathy (HCC) - Plan: Comprehensive metabolic panel, Hemoglobin A1c, Microalbumin / creatinine urine ratio, VITAMIN D 25 Hydroxy (Vit-D Deficiency, Fractures), Ambulatory referral to Ophthalmology, CANCELED: Comprehensive metabolic panel, CANCELED: Hemoglobin A1c, CANCELED: Microalbumin / creatinine urine ratio, CANCELED: VITAMIN D 25 Hydroxy (Vit-D Deficiency, Fractures)  Hyperlipidemia, unspecified hyperlipidemia type - Plan: Lipid panel, CANCELED: Lipid panel  Black stool - Plan: Ambulatory referral to Gastroenterology, CBC with Differential/Platelet, CANCELED: CBC with Differential/Platelet  Tobacco abuse - Plan: Ambulatory referral to Psychology  Presence of arterial stent - splenic artery  B12 deficiency - Plan: B12 and Folate Panel, CANCELED: B12 and Folate Panel Labs today.   Continue current medication until labs  reviewed. Consider increasing metoprolol to 50 mg daily for ST and HTN. Due to see Dt. Agbor-Etang in Cardiology for 6 mos f/up in Jan and will need appt.   Referral for eye exam - dilated on Ozempic  Referral to behavioral health to assist with smoking cessation.   Referral to GI for melena with hx of AVMs.   He had wanted to discuss DNR in the past, will need to have a separate appointment to do that.  We can set him up for follow-up appointment in the next week.   Lab addendum:  I spoke to him and results given. Lipids are at goal. Neg micro albumin,  A1c is worse- now 8%- Ozmepic should help.  Elevated WBC-no signs of infection (check for dental)  reported diarrhea with black stool.  Hgb down 12. B12 is better on B12 IM monthly injections- to continue. Low vit D. Will order Vit D weekly and then will advise 1000 IU daily to follow. Cont with new Ozempic and titrate dose. No BM since one black stool. No abd pain/cramps. Close questioning today and no signs or sx of infection.  Denies any fevers or chills, night sweats.  Denies any unintentional weight loss.  He has noted no cough, sputum production, sore throat, abdominal pain, or urinary tract infection symptoms.   I discussed the assessment and treatment plan with the patient. The patient was provided an opportunity to ask questions and all were answered. The patient agreed with the plan and demonstrated an understanding of the instructions.   The patient was advised to call back or seek an in-person evaluation if the symptoms worsen or if the condition fails to improve as anticipated.  [name], NP Adult Nurse Practitioner [place] [phone]

## 2020-01-22 LAB — COMPREHENSIVE METABOLIC PANEL
AG Ratio: 1.5 (calc) (ref 1.0–2.5)
ALT: 14 U/L (ref 9–46)
AST: 9 U/L — ABNORMAL LOW (ref 10–35)
Albumin: 4.1 g/dL (ref 3.6–5.1)
Alkaline phosphatase (APISO): 84 U/L (ref 35–144)
BUN/Creatinine Ratio: 14 (calc) (ref 6–22)
BUN: 9 mg/dL (ref 7–25)
CO2: 26 mmol/L (ref 20–32)
Calcium: 9.4 mg/dL (ref 8.6–10.3)
Chloride: 102 mmol/L (ref 98–110)
Creat: 0.66 mg/dL — ABNORMAL LOW (ref 0.70–1.33)
Globulin: 2.7 g/dL (calc) (ref 1.9–3.7)
Glucose, Bld: 159 mg/dL — ABNORMAL HIGH (ref 65–99)
Potassium: 4 mmol/L (ref 3.5–5.3)
Sodium: 138 mmol/L (ref 135–146)
Total Bilirubin: 0.3 mg/dL (ref 0.2–1.2)
Total Protein: 6.8 g/dL (ref 6.1–8.1)

## 2020-01-22 LAB — LIPID PANEL
Cholesterol: 94 mg/dL (ref <200)
HDL: 36 mg/dL — ABNORMAL LOW (ref >=40)
LDL Cholesterol (Calc): 40 mg/dL (calc)
Non-HDL Cholesterol (Calc): 58 mg/dL (calc) (ref <130)
Total CHOL/HDL Ratio: 2.6 (calc) (ref <5.0)
Triglycerides: 98 mg/dL (ref <150)

## 2020-01-22 LAB — CBC WITH DIFFERENTIAL/PLATELET
Absolute Monocytes: 1142 cells/uL — ABNORMAL HIGH (ref 200–950)
Basophils Absolute: 68 cells/uL (ref 0–200)
Basophils Relative: 0.5 %
Eosinophils Absolute: 68 cells/uL (ref 15–500)
Eosinophils Relative: 0.5 %
HCT: 35.7 % — ABNORMAL LOW (ref 38.5–50.0)
Hemoglobin: 12 g/dL — ABNORMAL LOW (ref 13.2–17.1)
Lymphs Abs: 3631 cells/uL (ref 850–3900)
MCH: 29.8 pg (ref 27.0–33.0)
MCHC: 33.6 g/dL (ref 32.0–36.0)
MCV: 88.6 fL (ref 80.0–100.0)
MPV: 10 fL (ref 7.5–12.5)
Monocytes Relative: 8.4 %
Neutro Abs: 8690 cells/uL — ABNORMAL HIGH (ref 1500–7800)
Neutrophils Relative %: 63.9 %
Platelets: 449 10*3/uL — ABNORMAL HIGH (ref 140–400)
RBC: 4.03 10*6/uL — ABNORMAL LOW (ref 4.20–5.80)
RDW: 12.6 % (ref 11.0–15.0)
Total Lymphocyte: 26.7 %
WBC: 13.6 10*3/uL — ABNORMAL HIGH (ref 3.8–10.8)

## 2020-01-22 LAB — MICROALBUMIN / CREATININE URINE RATIO
Creatinine, Urine: 210 mg/dL (ref 20–320)
Microalb Creat Ratio: 16 mcg/mg creat (ref <30)
Microalb, Ur: 3.3 mg/dL

## 2020-01-22 LAB — VITAMIN D 25 HYDROXY (VIT D DEFICIENCY, FRACTURES): Vit D, 25-Hydroxy: 13 ng/mL — ABNORMAL LOW (ref 30–100)

## 2020-01-22 LAB — HEMOGLOBIN A1C
Hgb A1c MFr Bld: 8 % of total Hgb — ABNORMAL HIGH (ref <5.7)
Mean Plasma Glucose: 183 (calc)
eAG (mmol/L): 10.1 (calc)

## 2020-01-22 LAB — B12 AND FOLATE PANEL
Folate: 8.7 ng/mL
Vitamin B-12: 313 pg/mL (ref 200–1100)

## 2020-01-22 MED ORDER — VITAMIN D (ERGOCALCIFEROL) 1.25 MG (50000 UNIT) PO CAPS: 50000.0000 [IU] | ORAL_CAPSULE | ORAL | 0 refills | Status: DC

## 2020-01-22 NOTE — Addendum Note (Signed)
Addended by: [name] A on: 01/22/2020 09:15 AM   Modules accepted: Orders

## 2020-01-23 ENCOUNTER — Encounter: Payer: Self-pay | Admitting: Nurse Practitioner

## 2020-01-23 NOTE — Patient Instructions (Addendum)
Labs today.   Continue current medication until labs reviewed. Consider increasing metoprolol to 50 mg daily for ST and HTN. Due to see Dt. Agbor-Etang in Cardiology for 6 mos f/up in Jan and will need appt.   Referral for eye exam - dilated on Ozempic  Referral to behavioral health to assist with smoking cessation.   Referral to GI for melena with x of AVMs.   He had wanted to discuss DNR in the past, will need to have a separate appointment to do that.  We can set him up for follow-up appointment in the next week.   [name]

## 2020-01-25 ENCOUNTER — Other Ambulatory Visit: Payer: Self-pay

## 2020-01-25 ENCOUNTER — Other Ambulatory Visit (INDEPENDENT_AMBULATORY_CARE_PROVIDER_SITE_OTHER): Payer: Medicare HMO

## 2020-01-25 ENCOUNTER — Telehealth: Payer: Self-pay | Admitting: Nurse Practitioner

## 2020-01-25 DIAGNOSIS — E119 Type 2 diabetes mellitus without complications: Secondary | ICD-10-CM

## 2020-01-25 NOTE — Telephone Encounter (Signed)
Spoke with patient and he states he has not had any sx below. He has been doing good. Patient is going to come in this afternoon for lab work and will see you on 01/27/20 at 11:30 am.

## 2020-01-25 NOTE — Addendum Note (Signed)
Addended by: Sherrilee Gilles B on: 01/25/2020 10:36 AM   Modules accepted: Orders

## 2020-01-25 NOTE — Telephone Encounter (Signed)
Please call him and inquire about melena and constipation. His WBC was elevated and ask if he developed any signs of infection such as, dysuria, flank pain, abd pain, cough, URI, fever/chills.  Can he return today for repeat CBC and UA with culture?

## 2020-01-26 ENCOUNTER — Ambulatory Visit (INDEPENDENT_AMBULATORY_CARE_PROVIDER_SITE_OTHER): Payer: Medicare HMO | Admitting: Orthotics

## 2020-01-26 DIAGNOSIS — M2011 Hallux valgus (acquired), right foot: Secondary | ICD-10-CM

## 2020-01-26 DIAGNOSIS — M2010 Hallux valgus (acquired), unspecified foot: Secondary | ICD-10-CM

## 2020-01-26 DIAGNOSIS — E1142 Type 2 diabetes mellitus with diabetic polyneuropathy: Secondary | ICD-10-CM

## 2020-01-26 DIAGNOSIS — M2012 Hallux valgus (acquired), left foot: Secondary | ICD-10-CM

## 2020-01-26 LAB — CBC WITH DIFFERENTIAL/PLATELET
Basophils Absolute: 0.1 10*3/uL (ref 0.0–0.1)
Basophils Relative: 1.2 % (ref 0.0–3.0)
Eosinophils Absolute: 0.1 10*3/uL (ref 0.0–0.7)
Eosinophils Relative: 1.3 % (ref 0.0–5.0)
HCT: 35 % — ABNORMAL LOW (ref 39.0–52.0)
Hemoglobin: 11.8 g/dL — ABNORMAL LOW (ref 13.0–17.0)
Lymphocytes Relative: 43.7 % (ref 12.0–46.0)
Lymphs Abs: 3.9 10*3/uL (ref 0.7–4.0)
MCHC: 33.7 g/dL (ref 30.0–36.0)
MCV: 87.8 fl (ref 78.0–100.0)
Monocytes Absolute: 0.7 10*3/uL (ref 0.1–1.0)
Monocytes Relative: 7.8 % (ref 3.0–12.0)
Neutro Abs: 4.1 10*3/uL (ref 1.4–7.7)
Neutrophils Relative %: 46 % (ref 43.0–77.0)
Platelets: 546 10*3/uL — ABNORMAL HIGH (ref 150.0–400.0)
RBC: 3.98 Mil/uL — ABNORMAL LOW (ref 4.22–5.81)
RDW: 13.6 % (ref 11.5–15.5)
WBC: 8.8 10*3/uL (ref 4.0–10.5)

## 2020-01-26 NOTE — Progress Notes (Signed)
Patient called today noting that the brand of test strips sent in for him is on back order, pharmacy is requesting a different prescription.   Ordered generic glucometer, strips, lancets kit and pended for PCP, if agreed.

## 2020-01-26 NOTE — Addendum Note (Signed)
Addended by: [name] on: 01/26/2020 12:37 PM   Modules accepted: Orders

## 2020-01-26 NOTE — Progress Notes (Signed)

## 2020-01-27 ENCOUNTER — Ambulatory Visit (INDEPENDENT_AMBULATORY_CARE_PROVIDER_SITE_OTHER): Payer: Medicare HMO | Admitting: Nurse Practitioner

## 2020-01-27 ENCOUNTER — Telehealth: Payer: Self-pay

## 2020-01-27 ENCOUNTER — Encounter: Payer: Self-pay | Admitting: Nurse Practitioner

## 2020-01-27 ENCOUNTER — Other Ambulatory Visit: Payer: Self-pay

## 2020-01-27 VITALS — BP 122/74 | HR 110 | Temp 98.2°F | Ht 64.0 in | Wt 181.0 lb

## 2020-01-27 DIAGNOSIS — D72829 Elevated white blood cell count, unspecified: Secondary | ICD-10-CM | POA: Diagnosis not present

## 2020-01-27 DIAGNOSIS — F17200 Nicotine dependence, unspecified, uncomplicated: Secondary | ICD-10-CM | POA: Diagnosis not present

## 2020-01-27 DIAGNOSIS — K5901 Slow transit constipation: Secondary | ICD-10-CM | POA: Insufficient documentation

## 2020-01-27 MED ORDER — RELION TRUE METRIX TEST STRIPS VI STRP: ORAL_STRIP | 12 refills | Status: DC

## 2020-01-27 MED ORDER — NICOTINE 21 MG/24HR TD PT24: 21.0000 mg | MEDICATED_PATCH | Freq: Every day | TRANSDERMAL | 0 refills | Status: DC

## 2020-01-27 NOTE — Telephone Encounter (Signed)
Pt states that insurance will not cover glucose blood (RELION TRUE METRIX TEST STRIPS) test strip

## 2020-01-27 NOTE — Progress Notes (Signed)
Established Patient Office Visit  Subjective:  Patient ID: Benjamin Conley, male    DOB: Jul 25, 1965  Age: 54 y.o. MRN: [id number]  CC:  Chief Complaint  Patient presents with  . Follow-up    HPI Benjamin Conley is a 54 yo with complex medical history, here to follow-up on reports of black stool, constipation, and leukocytosis of unclear etiology, stalled tobacco cessation on Chantix alone. He was seen by telemedicine on 01/21/2020 please see note.   Leukocytosis:  He was asked to come back in for in-person evaluation after laboratory studies revealed WBC of 13.6 with elevated neutrophils,  stable Hgb 12.0. He reports no signs of symptoms of infection.He had a negative urine culture last month. He has ED/BPH and has seen Urology.  Repeat CBC with normal WBC. Repeat urine culture is pending.   Constipation: No BM for 1 week. He has taken no stool softeners or laxatives. He typically can have a BM every 3-4 days- this is a longer stretch. He feels comfortable without distention, cramps, gas, or pain. He eats a low fiber diet.   T2DM with neuropathy: A1c from 7.5 to 8.0: Increasing  Ozempic dose (new start 01/04/20) , Lantus 13 units per day as he reported lows,  Metformin 500 mg BID and is tolerated.   Nicotine use disorder: Smoking since age 59, rolls own cigarettes and use down from 2 ppd to 1 ppd on Chantix since 10/14/2019. Tried nicotine gum in distant past and cannot tolerate the taste. He has not tried the patches. Referral place to behavioral medicine to help with the habit/routine of smoking and any underlying anxiety. He does not have a tobacco free house as he lives with his brother as they make their own cigarettes and his brother is smoking. This makes it more difficult for the patient.    Past Medical History:  Diagnosis Date  . Acute posthemorrhagic anemia   . Complication of anesthesia    c/o difficulty breathing after anesthesia  . COPD (chronic obstructive pulmonary  disease) ([place])   . Diabetes mellitus without complication ([place])   . Hyperlipemia   . Melena 08/21/2019  . Paroxysmal supraventricular tachycardia ([place]) 08/19/2019  . Postural dizziness with presyncope 08/21/2019  . 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 Tobacco abuse   . Upper GI bleed 08/21/2019    Past Surgical History:  Procedure Laterality Date  . BACK SURGERY     lumbar  . COLONOSCOPY WITH PROPOFOL N/A 08/24/2019   Procedure: COLONOSCOPY WITH PROPOFOL;  Surgeon: [name], MD;  Location: [facility] ENDOSCOPY;  Service: Endoscopy;  Laterality: N/A;  . ESOPHAGOGASTRODUODENOSCOPY (EGD) WITH PROPOFOL N/A 08/24/2019   Procedure: ESOPHAGOGASTRODUODENOSCOPY (EGD) WITH PROPOFOL;  Surgeon: [name], MD;  Location: [facility] ENDOSCOPY;  Service: Endoscopy;  Laterality: N/A;  . STENT PLACEMENT VASCULAR ([place] [facility])  07/2019   stenosis of distal splenic artery and stent placed  . VISCERAL ANGIOGRAPHY N/A 08/06/2019   Procedure: VISCERAL ANGIOGRAPHY;  Surgeon: [name], MD;  Location: [facility] CV LAB;  Service: Cardiovascular;  Laterality: N/A;    Family History  Problem Relation Age of Onset  . Diabetes Mellitus II Mother   . Diabetes Mellitus II Brother   . Hyperlipidemia Brother   . Colon cancer Maternal Aunt   . Colon cancer Maternal Uncle     Social History   Socioeconomic History  . Marital status: Single    Spouse name: Not on file  . Number of children: Not on file  . Years  of education: Not on file  . Highest education level: Not on file  Occupational History  . Not on file  Tobacco Use  . Smoking status: Current Every Day Smoker    Packs/day: 1.00    Years: 48.00    Pack years: 48.00    Types: Cigarettes  . Smokeless tobacco: Never Used  Vaping Use  . Vaping Use: Never used  Substance and Sexual Activity  . Alcohol use: No    Comment: Never been a problem  . Drug use: No  . Sexual activity: Yes  Other Topics Concern  . Not on file  Social History Narrative    Single, lives alone, unemployed.   Social Determinants of Health   Financial Resource Strain: Low Risk   . Difficulty of Paying Living Expenses: Not hard at all  Food Insecurity: No Food Insecurity  . Worried About Charity fundraiser in the Last Year: Never true  . Ran Out of Food in the Last Year: Never true  Transportation Needs: No Transportation Needs  . Lack of Transportation (Medical): No  . Lack of Transportation (Non-Medical): No  Physical Activity:   . Days of Exercise per Week: Not on file  . Minutes of Exercise per Session: Not on file  Stress: Stress Concern Present  . Feeling of Stress : Very much  Social Connections:   . Frequency of Communication with Friends and Family: Not on file  . Frequency of Social Gatherings with Friends and Family: Not on file  . Attends Religious Services: Not on file  . Active Member of Clubs or Organizations: Not on file  . Attends Archivist Meetings: Not on file  . Marital Status: Not on file  Intimate Partner Violence: Not At Risk  . Fear of Current or Ex-Partner: No  . Emotionally Abused: No  . Physically Abused: No  . Sexually Abused: No    Outpatient Medications Prior to Visit  Medication Sig Dispense Refill  . albuterol (VENTOLIN HFA) 108 (90 Base) MCG/ACT inhaler Inhale 2 puffs into the lungs every 6 (six) hours as needed for wheezing or shortness of breath. 6.7 g 3  . aspirin EC 81 MG EC tablet Take 1 tablet (81 mg total) by mouth daily. 90 tablet 1  . atorvastatin (LIPITOR) 40 MG tablet Take 1 tablet (40 mg total) by mouth daily. 90 tablet 1  . clopidogrel (PLAVIX) 75 MG tablet Take 1 tablet (75 mg total) by mouth daily. 90 tablet 3  . insulin glargine (LANTUS) 100 UNIT/ML Solostar Pen Inject 20 Units into the skin at bedtime. 15 mL 11  . Iron-Vitamin C (VITRON-C) 65-125 MG TABS Take 1 tablet by mouth daily. 60 tablet 0  . metFORMIN (GLUCOPHAGE-XR) 500 MG 24 hr tablet Take 1 tablet (500 mg total) by mouth 2 (two)  times daily after a meal. 270 tablet 1  . metoprolol succinate (TOPROL XL) 25 MG 24 hr tablet Take 1 tablet (25 mg total) by mouth daily. 90 tablet 3  . pantoprazole (PROTONIX) 40 MG tablet Take 1 tablet (40 mg total) by mouth daily. 30 tablet 3  . Semaglutide,0.25 or 0.5MG /DOS, (OZEMPIC, 0.25 OR 0.5 MG/DOSE,) 2 MG/1.5ML SOPN Inject 0.25 mg into subcutaneous skin once a week for 4 weeks and if tolerated increase the dose to 0.50 mg subcutanous once a week. 1.5 mL 1  . sildenafil (VIAGRA) 100 MG tablet Take 1 tablet (100 mg total) by mouth daily as needed for erectile dysfunction. 30 tablet 0  .  tamsulosin (FLOMAX) 0.4 MG CAPS capsule Take 1 capsule (0.4 mg total) by mouth daily. 90 capsule 3  . Tiotropium Bromide-Olodaterol (STIOLTO RESPIMAT) 2.5-2.5 MCG/ACT AERS Inhale 2 puffs into the lungs daily. 1 each 0  . varenicline (APO-VARENICLINE) 1 MG tablet Take 1 tablet (1 mg total) by mouth 2 (two) times daily. Take 0.5 mg daily x 3 days, then 0.5 mg BID x 4 days, then 1 mg BID thereafter 56 tablet 3  . Vitamin D, Ergocalciferol, (DRISDOL) 1.25 MG (50000 UNIT) CAPS capsule Take 1 capsule (50,000 Units total) by mouth every 7 (seven) days. 8 capsule 0  . glucose blood (RELION TRUE METRIX TEST STRIPS) test strip Use as instructed to check blood sugars twice daily. Dx E11.9 200 each 12   No facility-administered medications prior to visit.    Allergies  Allergen Reactions  . Wasp Venom Anaphylaxis  . Wasp Venom Protein Anaphylaxis  . Coffee Flavor Nausea And Vomiting    Patient states coffee gives him nausea and stomach cramps  . Onion     Review of Systems  Constitutional: Negative for chills, fever and unexpected weight change.  HENT: Negative for congestion, sinus pain and sore throat.   Respiratory: Negative for cough and shortness of breath.   Cardiovascular: Negative for chest pain.  Gastrointestinal: Positive for constipation. Negative for abdominal pain.  Endocrine: Negative.     Genitourinary: Positive for frequency. Negative for dysuria, flank pain, hematuria, penile pain, testicular pain and urgency.  Musculoskeletal: Negative.   Neurological: Negative.       Objective:    Physical Exam Vitals reviewed.  Constitutional:      Appearance: He is normal weight.  HENT:     Head: Normocephalic.  Cardiovascular:     Rate and Rhythm: Normal rate and regular rhythm.     Pulses: Normal pulses.     Heart sounds: Normal heart sounds.  Pulmonary:     Effort: Pulmonary effort is normal.     Breath sounds: Normal breath sounds.  Abdominal:     Palpations: Abdomen is soft.     Tenderness: There is no abdominal tenderness. There is no right CVA tenderness or left CVA tenderness.  Musculoskeletal:        General: Normal range of motion.     Cervical back: Normal range of motion and neck supple.  Lymphadenopathy:     Cervical: No cervical adenopathy.  Skin:    General: Skin is warm and dry.  Neurological:     Mental Status: He is alert.  Psychiatric:        Mood and Affect: Mood normal.        Behavior: Behavior normal.     BP 122/74 (BP Location: Left Arm, Patient Position: Sitting, Cuff Size: Normal)   Pulse (!) 110   Temp 98.2 F (36.8 C) (Oral)   Ht 5\' 4"  (1.626 m)   Wt 181 lb (82.1 kg)   SpO2 99%   BMI 31.07 kg/m  Wt Readings from Last 3 Encounters:  01/27/20 181 lb (82.1 kg)  01/21/20 181 lb (82.1 kg)  01/18/20 181 lb (82.1 kg)   Pulse Readings from Last 3 Encounters:  01/27/20 (!) 110  01/21/20 (!) 115  01/18/20 (!) 105    BP Readings from Last 3 Encounters:  01/27/20 122/74  01/21/20 (!) 147/90  01/18/20 113/74    Lab Results  Component Value Date   CHOL 94 01/21/2020   HDL 36 (L) 01/21/2020   LDLCALC 40 01/21/2020  TRIG 98 01/21/2020   CHOLHDL 2.6 01/21/2020      Health Maintenance Due  Topic Date Due  . FOOT EXAM  Never done  . OPHTHALMOLOGY EXAM  11/10/2019    There are no preventive care reminders to display for  this patient.  Lab Results  Component Value Date   TSH 1.68 08/20/2019   Lab Results  Component Value Date   WBC 8.8 01/25/2020   HGB 11.8 (L) 01/25/2020   HCT 35.0 (L) 01/25/2020   MCV 87.8 01/25/2020   PLT 546.0 (H) 01/25/2020   Lab Results  Component Value Date   NA 138 01/21/2020   K 4.0 01/21/2020   CO2 26 01/21/2020   GLUCOSE 159 (H) 01/21/2020   BUN 9 01/21/2020   CREATININE 0.66 (L) 01/21/2020   BILITOT 0.3 01/21/2020   ALKPHOS 79 09/06/2019   AST 9 (L) 01/21/2020   ALT 14 01/21/2020   PROT 6.8 01/21/2020   ALBUMIN 4.5 09/06/2019   CALCIUM 9.4 01/21/2020   ANIONGAP 12 09/23/2019   GFR 117.52 10/21/2019   Lab Results  Component Value Date   CHOL 94 01/21/2020   Lab Results  Component Value Date   HDL 36 (L) 01/21/2020   Lab Results  Component Value Date   LDLCALC 40 01/21/2020   Lab Results  Component Value Date   TRIG 98 01/21/2020   Lab Results  Component Value Date   CHOLHDL 2.6 01/21/2020   Lab Results  Component Value Date   HGBA1C 8.0 (H) 01/21/2020      Assessment & Plan:   Problem List Items Addressed This Visit      Digestive   Slow transit constipation     Other   Nicotine use disorder   Relevant Medications   nicotine (NICODERM CQ - DOSED IN MG/24 HOURS) 21 mg/24hr patch   Other Relevant Orders   Ambulatory referral to Psychology   Leukocytosis - Primary      Meds ordered this encounter  Medications  . nicotine (NICODERM CQ - DOSED IN MG/24 HOURS) 21 mg/24hr patch    Sig: Place 1 patch (21 mg total) onto the skin daily.    Dispense:  28 patch    Refill:  0    Order Specific Question:   Supervising Provider    Answer:   Einar Pheasant [id number]  . DISCONTD: glucose blood (RELION TRUE METRIX TEST STRIPS) test strip    Sig: Use as instructed to check blood sugars twice daily. Dx E11.9    Dispense:  200 each    Refill:  12    Order Specific Question:   Supervising Provider    Answer:   Einar Pheasant [id number]    Constipation: Begin fiber- Citrucel daily and drink double fluids.  Begin Senokot Miralax laxative and take as needed.   Tobacco:  Smoking cessation information provided with [place] quit line,  1 - [phone] and call for more information - coupons, counseling , help with problems related to stopping smoking.   1. Begin the nicotine patch every 24 hour. Will turn skin red- move the patch every morning. Ideal to wear a night if not having bad nightmares. If you get side effects- call us back to address.   Try to lay down most of the cigarettes. When an urge to smoke strikes- try the gum.   2. Add nicotine gum: There is a lowest dose of  2 mg dose and a higher dose 4 mg.  Chew until tingles- then [facility]  in the cheek.  Do not chew it up- will make you nauseated.  Use  the gum when you have a craving and do not reach for a cigarette reach for the gum     Weeks 1-6: use 1 piece every 1-2 hours.   Weeks 7-8 use 1 piece every 2-4 hours  Weeks 10-12 1 piece every 4-8 hours Duration of therapy is 12 weeks.  3. Behavioral therapist to help with stress/anxiety while quitting.   Lab addendum 01/28/2020: Please call him with positive UTI :staph epidermidis.  He had the transient elevation in WBC which prompted a search for infection. It is sensitive to Bactrim. He has no sulfa allergy. Will treat for male UTI Bactrim DS 500 mg BID for 7 days.   Follow-up: Return in about 3 months (around 04/28/2020) for with pcp  in 3 month. Needs Catie in 1 month.    This visit occurred during the SARS-CoV-2 public health emergency.  Safety protocols were in place, including screening questions prior to the visit, additional usage of staff PPE, and extensive cleaning of exam room while observing appropriate contact time as indicated for disinfecting solutions.    [name], NP

## 2020-01-27 NOTE — Patient Instructions (Addendum)
Constipation: Begin fiber- Citrucel daily and drink double fluids.  Begin Senokot Miralax laxative and take as needed.   Tobacco:  Smoking cessation information provided with [place] quit line,  1 - ([phone] and call for more information - coupons, counseling , help with problems related to stopping smoking.   1. Begin the nicotine patch every 24 hour. Will turn skin red- move the patch every morning. Ideal to wear a night if not having bad nightmares. If you get side effects- call us back to address.   Try to lay down most of the cigarettes. When an urge to smoke strikes- try the gum.   2. Add nicotine gum: There is a lowest dose of  2 mg dose and a higher dose 4 mg.  Chew until tingles- then PARK in the cheek.  Do not chew it up- will make you nauseated.  Use  the gum when you have a craving and do not reach for a cigarette reach for the gum     Weeks 1-6: use 1 piece every 1-2 hours.   Weeks 7-8 use 1 piece every 2-4 hours  Weeks 10-12 1 piece every 4-8 hours Duration of therapy is 12 weeks.  3. Behavioral therapist to help with stress/anxiety while quitting.    Tobacco Use Disorder Tobacco use disorder (TUD) occurs when a person craves, seeks, and uses tobacco, regardless of the consequences. This disorder can cause problems with mental and physical health. It can affect your ability to have healthy relationships, and it can keep you from meeting your responsibilities at work, home, or school. Tobacco may be:  Smoked as a cigarette or cigar.  Inhaled using e-cigarettes.  Smoked in a pipe or hookah.  Chewed as smokeless tobacco.  Inhaled into the nostrils as snuff. Tobacco products contain a dangerous chemical called nicotine, which is very addictive. Nicotine triggers hormones that make the body feel stimulated and works on areas of the brain that make you feel good. These effects can make it hard for people to quit nicotine. Tobacco contains many other unsafe chemicals  that can damage almost every organ in the body. Smoking tobacco also puts others in danger due to fire risk and possible health problems caused by breathing in secondhand smoke. What are the signs or symptoms? Symptoms of TUD may include:  Being unable to slow down or stop your tobacco use.  Spending an abnormal amount of time getting or using tobacco.  Craving tobacco. Cravings may last for up to 6 months after quitting.  Tobacco use that: ? Interferes with your work, school, or home life. ? Interferes with your personal and social relationships. ? Makes you give up activities that you once enjoyed or found important.  Using tobacco even though you know that it is: ? Dangerous or bad for your health or someone else's health. ? Causing problems in your life.  Needing more and more of the substance to get the same effect (developing tolerance).  Experiencing unpleasant symptoms if you do not use the substance (withdrawal). Withdrawal symptoms may include: ? Depressed, anxious, or irritable mood. ? Difficulty concentrating. ? Increased appetite. ? Restlessness or trouble sleeping.  Using the substance to avoid withdrawal. How is this diagnosed? This condition may be diagnosed based on:  Your current and past tobacco use. Your health care provider may ask questions about how your tobacco use affects your life.  A physical exam. You may be diagnosed with TUD if you have at least two symptoms within a  39-month period. How is this treated? This condition is treated by stopping tobacco use. Many people are unable to quit on their own and need help. Treatment may include:  Nicotine replacement therapy (NRT). NRT provides nicotine without the other harmful chemicals in tobacco. NRT gradually lowers the dosage of nicotine in the body and reduces withdrawal symptoms. NRT is available as: ? Over-the-counter gums, lozenges, and skin patches. ? Prescription mouth inhalers and nasal  sprays.  Medicine that acts on the brain to reduce cravings and withdrawal symptoms.  A type of talk therapy that examines your triggers for tobacco use, how to avoid them, and how to cope with cravings (behavioral therapy).  Hypnosis. This may help with withdrawal symptoms.  Joining a support group for others coping with TUD. The best treatment for TUD is usually a combination of medicine, talk therapy, and support groups. Recovery can be a long process. Many people start using tobacco again after stopping (relapse). If you relapse, it does not mean that treatment will not work. Follow these instructions at home:  Lifestyle  Do not use any products that contain nicotine or tobacco, such as cigarettes and e-cigarettes.  Avoid things that trigger tobacco use as much as you can. Triggers include people and situations that usually cause you to use tobacco.  Avoid drinks that contain caffeine, including coffee. These may worsen some withdrawal symptoms.  Find ways to manage stress. Wanting to smoke may cause stress, and stress can make you want to smoke. Relaxation techniques such as deep breathing, meditation, and yoga may help.  Attend support groups as needed. These groups are an important part of long-term recovery for many people. General instructions  Take over-the-counter and prescription medicines only as told by your health care provider.  Check with your health care provider before taking any new prescription or over-the-counter medicines.  Decide on a friend, family member, or smoking quit-line (such as 1-800-QUIT-NOW in the [place] that you can call or text when you feel the urge to smoke or when you need help coping with cravings.  Keep all follow-up visits as told by your health care provider and therapist. This is important. Contact a health care provider if:  You are not able to take your medicines as prescribed.  Your symptoms get worse, even with  treatment. Summary  Tobacco use disorder (TUD) occurs when a person craves, seeks, and uses tobacco regardless of the consequences.  This condition may be diagnosed based on your current and past tobacco use and a physical exam.  Many people are unable to quit on their own and need help. Recovery can be a long process.  The most effective treatment for TUD is usually a combination of medicine, talk therapy, and support groups. This information is not intended to replace advice given to you by your health care provider. Make sure you discuss any questions you have with your health care provider. Document Revised: 02/12/2017 Document Reviewed: 02/12/2017 Elsevier Patient Education  [place].  Constipation, Adult Constipation is when a person has fewer bowel movements in a week than normal, has difficulty having a bowel movement, or has stools that are dry, hard, or larger than normal. Constipation may be caused by an underlying condition. It may become worse with age if a person takes certain medicines and does not take in enough fluids. Follow these instructions at home: Eating and drinking   Eat foods that have a lot of fiber, such as fresh fruits and vegetables, whole grains,  and beans.  Limit foods that are high in fat, low in fiber, or overly processed, such as french fries, hamburgers, cookies, candies, and soda.  Drink enough fluid to keep your urine clear or pale yellow. General instructions  Exercise regularly or as told by your health care provider.  Go to the restroom when you have the urge to go. Do not hold it in.  Take over-the-counter and prescription medicines only as told by your health care provider. These include any fiber supplements.  Practice pelvic floor retraining exercises, such as deep breathing while relaxing the lower abdomen and pelvic floor relaxation during bowel movements.  Watch your condition for any changes.  Keep all follow-up visits as  told by your health care provider. This is important. Contact a health care provider if:  You have pain that gets worse.  You have a fever.  You do not have a bowel movement after 4 days.  You vomit.  You are not hungry.  You lose weight.  You are bleeding from the anus.  You have thin, pencil-like stools. Get help right away if:  You have a fever and your symptoms suddenly get worse.  You leak stool or have blood in your stool.  Your abdomen is bloated.  You have severe pain in your abdomen.  You feel dizzy or you faint. This information is not intended to replace advice given to you by your health care provider. Make sure you discuss any questions you have with your health care provider. Document Revised: 02/07/2017 Document Reviewed: 08/16/2015 Elsevier Patient Education  2020 [employer].

## 2020-01-28 LAB — URINE CULTURE
MICRO NUMBER:: 11209623
SPECIMEN QUALITY:: ADEQUATE

## 2020-01-28 MED ORDER — SULFAMETHOXAZOLE-TRIMETHOPRIM 800-160 MG PO TABS: 1.0000 | ORAL_TABLET | Freq: Two times a day (BID) | ORAL | 0 refills | Status: DC

## 2020-01-28 MED ORDER — BLOOD GLUCOSE METER KIT: PACK | 0 refills | Status: AC

## 2020-01-28 MED ORDER — GLUCOSE BLOOD VI STRP: 1.0000 | ORAL_STRIP | Freq: Two times a day (BID) | 3 refills | Status: DC

## 2020-01-28 NOTE — Addendum Note (Signed)
Addended by: [name] A on: 01/28/2020 10:27 AM   Modules accepted: Orders

## 2020-01-28 NOTE — Telephone Encounter (Addendum)
Spoke with patient and [facility] can not get the relion test strips right now and hasn't been able to in a while. Also looks like with his insurance they do not cover that particular one anymore. Looks like his insurance covers the onetouch verio meter and strips and will need to be sent in to his pharmacy.   Also spoke with patient about his lab results and he does not have a sulfa allergy.

## 2020-01-28 NOTE — Addendum Note (Signed)
Addended by: [name] A on: 01/28/2020 12:49 PM   Modules accepted: Orders

## 2020-01-28 NOTE — Telephone Encounter (Signed)
Benjamin Conley,   Can you check with [facility] and make sure they have what he needs.

## 2020-01-28 NOTE — Telephone Encounter (Signed)
Spoke with patient and he is aware of new rx and antibiotic was sent in

## 2020-01-28 NOTE — Addendum Note (Signed)
Addended by: [name] A on: 01/28/2020 11:09 AM   Modules accepted: Orders

## 2020-01-28 NOTE — Telephone Encounter (Signed)
[facility] did no receive the fax yet that was faxed at 3:50pm; I refaxed it again now.

## 2020-01-30 ENCOUNTER — Encounter: Payer: Self-pay | Admitting: Nurse Practitioner

## 2020-01-30 DIAGNOSIS — D72829 Elevated white blood cell count, unspecified: Secondary | ICD-10-CM | POA: Insufficient documentation

## 2020-02-15 ENCOUNTER — Ambulatory Visit: Payer: Medicare HMO | Admitting: Pharmacist

## 2020-02-15 DIAGNOSIS — E785 Hyperlipidemia, unspecified: Secondary | ICD-10-CM

## 2020-02-15 DIAGNOSIS — Z720 Tobacco use: Secondary | ICD-10-CM

## 2020-02-15 DIAGNOSIS — E1140 Type 2 diabetes mellitus with diabetic neuropathy, unspecified: Secondary | ICD-10-CM

## 2020-02-15 DIAGNOSIS — IMO0002 Reserved for concepts with insufficient information to code with codable children: Secondary | ICD-10-CM

## 2020-02-15 DIAGNOSIS — F17200 Nicotine dependence, unspecified, uncomplicated: Secondary | ICD-10-CM

## 2020-02-15 MED ORDER — VARENICLINE TARTRATE 1 MG PO TABS: 1.0000 mg | ORAL_TABLET | Freq: Two times a day (BID) | ORAL | 1 refills | Status: DC

## 2020-02-15 MED ORDER — OZEMPIC (0.25 OR 0.5 MG/DOSE) 2 MG/1.5ML ~~LOC~~ SOPN: 0.5000 mg | PEN_INJECTOR | SUBCUTANEOUS | 1 refills | Status: DC

## 2020-02-15 NOTE — Chronic Care Management (AMB) (Signed)
Chronic Care Management   Pharmacy Note  02/15/2020 Name: Benjamin Conley MRN: [id number] DOB: 12/21/1965   Subjective:  Benjamin Conley is a 54 y.o. year old male who is a primary care patient of [name], NP. Collaborating today with supervising physician [name], MD. The CCM team was consulted for assistance with chronic disease management and care coordination needs.    Engaged with patient by telephone for follow up visit in response to provider referral for pharmacy case management and/or care coordination services.   Consent to Services:  [name] was given information about Chronic Care Management services, agreed to services, and gave verbal consent prior to initiation of services on 08/27/2019. Please see initial visit note for detailed documentation.   SDOH (Social Determinants of Health) assessments and interventions performed:  SDOH Interventions     Most Recent Value  SDOH Interventions  SDOH Interventions for the Following Domains Tobacco  Financial Strain Interventions Intervention Not Indicated  Tobacco Interventions Cessation Materials Given and Reviewed, Other (Comment)  [referral to [place] [facility]uit[facility]]       Objective:  Lab Results  Component Value Date   CREATININE 0.66 (L) 01/21/2020   CREATININE 0.70 10/21/2019   CREATININE 0.64 09/23/2019    Lab Results  Component Value Date   HGBA1C 8.0 (H) 01/21/2020       Component Value Date/Time   CHOL 94 01/21/2020 1505   TRIG 98 01/21/2020 1505   HDL 36 (L) 01/21/2020 1505   CHOLHDL 2.6 01/21/2020 1505   VLDL 28.4 10/21/2019 1432   LDLCALC 40 01/21/2020 1505   CBC Latest Ref Rng & Units 01/25/2020 01/21/2020 12/30/2019  WBC 4.0 - 10.5 K/uL 8.8 13.6(H) 9.8  Hemoglobin 13.0 - 17.0 g/dL 11.8(L) 12.0(L) 13.4  Hematocrit 39 - 52 % 35.0(L) 35.7(L) 39.6  Platelets 150 - 400 K/uL 546.0(H) 449(H) 442(H)      BP Readings from Last 3 Encounters:  01/27/20 122/74  01/21/20 (!) 147/90   01/18/20 113/74    Assessment/Interventions: Review of patient past medical history, allergies, medications, health status, including review of consultants reports, laboratory and other test data, was performed as part of comprehensive evaluation and provision of chronic care management services.   Allergies  Allergen Reactions  . Wasp Venom Anaphylaxis  . Wasp Venom Protein Anaphylaxis  . Coffee Flavor Nausea And Vomiting    Patient states coffee gives him nausea and stomach cramps  . Onion     Medications Reviewed Today    Reviewed by [name], RPH-CPP (Pharmacist) on 02/15/20 at [place] List Status: <None>  Medication Order Taking? Sig Documenting Provider Last Dose Status Informant  albuterol (VENTOLIN HFA) 108 (90 Base) MCG/ACT inhaler [id number] No Inhale 2 puffs into the lungs every 6 (six) hours as needed for wheezing or shortness of breath.  Patient not taking: Reported on 02/15/2020   [name], NP Not Taking Active   aspirin EC 81 MG EC tablet [id number] Yes Take 1 tablet (81 mg total) by mouth daily. [name], MD Taking Active Self  atorvastatin (LIPITOR) 40 MG tablet [id number] Yes Take 1 tablet (40 mg total) by mouth daily. [name], NP Taking Active   blood glucose meter kit and supplies [id number] Yes Dispense based on patient and insurance preference. Use up to four times daily as directed. (FOR ICD-10 E10.9, E11.9). [name], NP Taking Active   clopidogrel (PLAVIX) 75 MG tablet [id number] Yes Take 1 tablet (75 mg total) by  mouth daily. [name], NP Taking Active   glucose blood test strip [id number] Yes 1 each by Other route in the morning and at bedtime. Use to test blood sugar twice daily. [name], NP Taking Active   insulin glargine (LANTUS) 100 UNIT/ML Solostar Pen [id number] Yes Inject 20 Units into the skin at bedtime. [name], NP Taking Active            Med Note ([place] Feb 15, 2020   1:15 PM) 12 units  Iron-Vitamin C (VITRON-C) 65-125 MG TABS [id number] Yes Take 1 tablet by mouth daily. [name], NP Taking Active   metFORMIN (GLUCOPHAGE-XR) 500 MG 24 hr tablet [id number] Yes Take 1 tablet (500 mg total) by mouth 2 (two) times daily after a meal. [name], NP Taking Active            Med Note ([place] Feb 15, 2020  1:14 PM) 500 mg daily   metoprolol succinate (TOPROL XL) 25 MG 24 hr tablet [id number] Yes Take 1 tablet (25 mg total) by mouth daily. [name], MD Taking Active Self  nicotine (NICODERM CQ - DOSED IN MG/24 HOURS) 21 mg/24hr patch [id number] Yes Place 1 patch (21 mg total) onto the skin daily. [name], NP Taking Active   pantoprazole (PROTONIX) 40 MG tablet [id number] Yes Take 1 tablet (40 mg total) by mouth daily. [name], MD Taking Active   Semaglutide,0.25 or 0.5MG/DOS, (OZEMPIC, 0.25 OR 0.5 MG/DOSE,) 2 MG/1.5ML SOPN [id number] Yes Inject 0.5 mg into the skin once a week. [name], MD Taking Active   sildenafil (VIAGRA) 100 MG tablet [id number] Yes Take 1 tablet (100 mg total) by mouth daily as needed for erectile dysfunction. [name], PA-C Taking Active   tamsulosin (FLOMAX) 0.4 MG CAPS capsule [id number] Yes Take 1 capsule (0.4 mg total) by mouth daily. [name], MD Taking Active   Tiotropium Bromide-Olodaterol (STIOLTO RESPIMAT) 2.5-2.5 MCG/ACT AERS [id number] Yes Inhale 2 puffs into the lungs daily. [name], MD Taking Active   varenicline (CHANTIX) 1 MG tablet [id number] Yes Take 1 tablet (1 mg total) by mouth 2 (two) times daily. [name], MD Taking Active   Vitamin D, Ergocalciferol, (DRISDOL) 1.25 MG (50000 UNIT) CAPS capsule [id number] Yes Take 1 capsule (50,000 Units total) by mouth every 7 (seven) days. [name], NP Taking Active           Patient Active Problem List   Diagnosis Date Noted  . Leukocytosis 01/30/2020  . Nicotine use  disorder 01/27/2020  . Slow transit constipation 01/27/2020  . Nail avulsion of toe, initial encounter 12/09/2019  . Benign prostatic hyperplasia with post-void dribbling 10/30/2019  . Abnormal ejaculation 10/21/2019  . B12 deficiency 10/21/2019  . Rectal pressure 10/21/2019  . Thrombosis of splenic artery ([place]) 10/03/2019  . Abnormal brain MRI 09/22/2019  . Difficulty sleeping 09/22/2019  . Tinnitus of both ears 09/22/2019  . Pain due to onychomycosis of toenails of both feet 09/06/2019  . Diabetic neuropathy ([place]) 09/06/2019  . Coagulation disorder ([place]) 09/06/2019  . Iron deficiency anemia due to chronic blood loss 08/29/2019  . Thrombocytosis 08/24/2019  . Angiodysplasia of intestinal tract   . Black stool 08/22/2019  . Presence of arterial stent - splenic artery 08/21/2019  . Dizziness 08/19/2019  . 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  . COPD with chronic bronchitis ([place]) 08/01/2019  . Tobacco abuse  08/01/2019  . Diabetes mellitus without complication ([place])   . Hyperlipidemia   . Impingement syndrome of shoulder region 03/30/2018  . Hypertension 11/10/2013    Medication Assistance: None required. Patient affirms current coverage meets needs.   Patient Care Plan: Medication Management    Problem Identified: Diabetes, Tobacco Abuse, Splenic Infarct, Tachycardia     Long-Range Goal: Disease Progression Prevention   This Visit's Progress: On track  Priority: High  Note:   Current Barriers:  . Unable to independently monitor therapeutic efficacy . Unable to achieve control of diabetes  . Suboptimal cardiovascular risk reduction  Pharmacist Clinical Goal(s):  [name] Over the next 90 days, patient will achieve adherence to monitoring guidelines and medication adherence to achieve therapeutic efficacy. . Over the next 90 days, patient will achieve control of diabetes as evidenced by improvement in A1c through collaboration with PharmD and provider.    Interventions: . Inter-disciplinary care team collaboration (see longitudinal plan of care) . Comprehensive medication review performed; medication list updated in electronic medical record  Diabetes: . Uncontrolled; current treatment: metformin XR 500 mg daily (notes he was taking XR 500 mg BID, but wondered if metformin was impacting his increased stool frequency); Ozempic 0.5 mg (x 1 week) . Current glucose readings: fasting glucose: 120-140s, post prandial glucose: 150-190s . Endorses increased stomach fullness, reduced appetite since dose increase of Ozempic last week  Hypertension: . Uncontrolled/controlled; current treatment: metformin succinate 25 mg daily  . Current home readings: SBP 100-120/60-70s, HR remaining 90-110s. No chest pain, new SOB, headaches.  . Recommended to follow up with cardiology in January as recommended by previous PCP. Patient confirms he has the office phone number and will call to schedule.  . Reviewed that continued reduction in nicotine use may help reduce BP, HR.   Hyperlipidemia, hx splenic infarct . Controlled; current treatment: atorvastatin 40 mg daily . Antiplatelet regimen: aspirin 81 mg daily,  . Recommended to continue current regimen at this time  Chronic Obstructive Pulmonary Disease, hx Tobacco Abuse . Notes that he started using nicotine patches this past Saturday, 12/4. Continues on varenicline 1 mg BID, though notes he needs a refill. Has not smoked or rolled a cigarette since 02/12/20. Brother is still living with him and smoking, rolling cigarettes. Notes that when he has a craving, he goes and lays down and takes a nap. Other hobbies include metal detecting, but notes that it is too cold for this hobby right now.  [name] COPD appropriately treated; current treatment: Stiolto 2.5/2.5 mcg 2 puffs daily. Patient has not been taking recently and has not needed albuterol   . Recommended to continue nicotine 21 mg patch daily + varenicline 1 mg  BID. Script sent for varenicline. Plan to step down to nicotine 14 mg patch after 6 weeks of therapy.  Discussed plan to continue varenicline therapy for at least 3-6 months.  . Reviewed to contact [place] [facility]uit line to talk to counselors for behavioral support, other hobbies/strategies to help support cessation attempts.   GI Complaints: . Reports frequent stool, 1-2 episodes daily. Notes that his stool still has occasional black tinge. Last Hgb normal. Denies increased fatigue, SOB. Has GI work up next month. Discussed that iron supplementation can cause black stool.  . Encouraged follow up with GI as scheduled.   Patient Goals/Self-Care Activities . Over the next 90 days, patient will:  - take medications as prescribed check blood glucose daily, document, and provide at future appointments check blood pressure daily, document, and provide  at future appointments  Follow Up Plan: Telephone follow up appointment with care management team member scheduled for: ~ 5 weeks       Plan: Telephone follow up appointment with care management team member scheduled for:~ 5 weeks  Catie Darnelle Maffucci, PharmD, [place], [place] Pharmacist [place] [phone]

## 2020-02-15 NOTE — Patient Instructions (Signed)
Benjamin Conley,   It was great talking to you today! For now, continue Ozempic 0.5 mg weekly and metformin XR 500 mg daily.   Continue varenicline 1 mg twice daily (with food) and nicotine 21 mg patches daily. After 6 weeks of nicotine 21 mg patches, we will step down to the nicotine 14 mg patches. I'll send that script at our next call.   Call [facility] for some help and support brainstorming things to do when you have cravings.   Call [facility] to schedule follow up on your elevated heart rate with Dr. Garen Lah.   Call me with any questions in the meantime!  Catie Darnelle Maffucci, PharmD [phone]    Visit Information Patient Care Plan: Medication Management    Problem Identified: Diabetes, Tobacco Abuse, Splenic Infarct, Tachycardia     Long-Range Goal: Disease Progression Prevention   This Visit's Progress: On track  Priority: High  Note:   Current Barriers:  . Unable to independently monitor therapeutic efficacy . Unable to achieve control of diabetes  . Suboptimal cardiovascular risk reduction  Pharmacist Clinical Goal(s):  [name] Over the next 90 days, patient will achieve adherence to monitoring guidelines and medication adherence to achieve therapeutic efficacy. . Over the next 90 days, patient will achieve control of diabetes as evidenced by improvement in A1c through collaboration with PharmD and provider.   Interventions: . Inter-disciplinary care team collaboration (see longitudinal plan of care) . Comprehensive medication review performed; medication list updated in electronic medical record  Diabetes: . Uncontrolled; current treatment: metformin XR 500 mg daily (notes he was taking XR 500 mg BID, but wondered if metformin was impacting his increased stool frequency); Ozempic 0.5 mg (x 1 week) . Current glucose readings: fasting glucose: 120-140s, post prandial glucose: 150-190s . Endorses increased stomach fullness, reduced appetite since dose increase of Ozempic last  week  Hypertension: . Uncontrolled/controlled; current treatment: metformin succinate 25 mg daily  . Current home readings: SBP 100-120/60-70s, HR remaining 90-110s. No chest pain, new SOB, headaches.  . Recommended to follow up with cardiology in January as recommended by previous PCP. Patient confirms he has the office phone number and will call to schedule.  . Reviewed that continued reduction in nicotine use may help reduce BP, HR.   Hyperlipidemia, hx splenic infarct . Controlled; current treatment: atorvastatin 40 mg daily . Antiplatelet regimen: aspirin 81 mg daily,  . Recommended to continue current regimen at this time  Chronic Obstructive Pulmonary Disease, hx Tobacco Abuse . Notes that he started using nicotine patches this past Saturday, 12/4. Continues on varenicline 1 mg BID, though notes he needs a refill. Has not smoked or rolled a cigarette since 02/12/20. Brother is still living with him and smoking, rolling cigarettes. Notes that when he has a craving, he goes and lays down and takes a nap. Other hobbies include metal detecting, but notes that it is too cold for this hobby right now.  [name] COPD appropriately treated; current treatment: Stiolto 2.5/2.5 mcg 2 puffs daily. Patient has not been taking recently and has not needed albuterol   . Recommended to continue nicotine 21 mg patch daily + varenicline 1 mg BID. Script sent for varenicline. Plan to step down to nicotine 14 mg patch after 6 weeks of therapy.  Discussed plan to continue varenicline therapy for at least 3-6 months.  . Reviewed to contact [place] Quit line to talk to counselors for behavioral support, other hobbies/strategies to help support cessation attempts.   GI Complaints: . Reports frequent  stool, 1-2 episodes daily. Notes that his stool still has occasional black tinge. Last Hgb normal. Denies increased fatigue, SOB. Has GI work up next month. Discussed that iron supplementation can cause black stool.  . Encouraged  follow up with GI as scheduled.   Patient Goals/Self-Care Activities . Over the next 90 days, patient will:  - take medications as prescribed check blood glucose daily, document, and provide at future appointments check blood pressure daily, document, and provide at future appointments  Follow Up Plan: Telephone follow up appointment with care management team member scheduled for: ~ 5 weeks       The patient verbalized understanding of instructions, educational materials, and care plan provided today and agreed to receive a mailed copy of patient instructions, educational materials, and care plan.   Plan: Telephone follow up appointment with care management team member scheduled for:~ 5 weeks  Catie Darnelle Maffucci, PharmD, [place], [place] Pharmacist [place] [phone]

## 2020-02-17 ENCOUNTER — Ambulatory Visit (INDEPENDENT_AMBULATORY_CARE_PROVIDER_SITE_OTHER): Payer: Medicare HMO | Admitting: Psychologist

## 2020-02-17 DIAGNOSIS — F411 Generalized anxiety disorder: Secondary | ICD-10-CM

## 2020-02-17 DIAGNOSIS — F17209 Nicotine dependence, unspecified, with unspecified nicotine-induced disorders: Secondary | ICD-10-CM | POA: Diagnosis not present

## 2020-02-21 DIAGNOSIS — H16223 Keratoconjunctivitis sicca, not specified as Sjogren's, bilateral: Secondary | ICD-10-CM | POA: Diagnosis not present

## 2020-02-21 DIAGNOSIS — E119 Type 2 diabetes mellitus without complications: Secondary | ICD-10-CM | POA: Diagnosis not present

## 2020-02-21 LAB — HM DIABETES EYE EXAM

## 2020-02-28 ENCOUNTER — Ambulatory Visit: Payer: Medicare HMO

## 2020-03-09 ENCOUNTER — Ambulatory Visit: Payer: Medicare HMO | Admitting: Podiatry

## 2020-03-15 ENCOUNTER — Ambulatory Visit: Payer: Medicare HMO | Admitting: Psychologist

## 2020-03-16 ENCOUNTER — Ambulatory Visit (INDEPENDENT_AMBULATORY_CARE_PROVIDER_SITE_OTHER): Payer: Medicare HMO | Admitting: Gastroenterology

## 2020-03-16 ENCOUNTER — Other Ambulatory Visit: Payer: Self-pay

## 2020-03-16 ENCOUNTER — Encounter: Payer: Self-pay | Admitting: Gastroenterology

## 2020-03-16 VITALS — BP 109/72 | HR 109 | Ht 64.0 in | Wt 183.2 lb

## 2020-03-16 DIAGNOSIS — K921 Melena: Secondary | ICD-10-CM

## 2020-03-16 NOTE — Progress Notes (Signed)
Primary Care Physician: [name], NP  Primary Gastroenterologist:  Dr. [name]  Chief Complaint  Patient presents with  . New Patient (Initial Visit)    Black stools    HPI: Benjamin Conley is a 55 y.o. male here with a report of black stools. The patient had seen me in the past for an EGD and colonoscopy.  The procedures were done in June of last year approximately 6 months ago.  At that time the patient was having the procedures done for GI bleeding.  The patient was found to have some AVMs.  The patient reports that he was started on some medications including vitamins that he thinks may have caused him to start having black stools.  The black stools have completely subsided.  He denies any abdominal pain nausea vomiting fevers chills or unexplained weight loss.  Past Medical History:  Diagnosis Date  . Acute posthemorrhagic anemia   . Complication of anesthesia    c/o difficulty breathing after anesthesia  . COPD (chronic obstructive pulmonary disease) ([place])   . Diabetes mellitus without complication ([place])   . Hyperlipemia   . Melena 08/21/2019  . Paroxysmal supraventricular tachycardia ([place]) 08/19/2019  . Postural dizziness with presyncope 08/21/2019  . 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 Tobacco abuse   . Upper GI bleed 08/21/2019    Current Outpatient Medications  Medication Sig Dispense Refill  . aspirin EC 81 MG EC tablet Take 1 tablet (81 mg total) by mouth daily. 90 tablet 1  . atorvastatin (LIPITOR) 40 MG tablet Take 1 tablet (40 mg total) by mouth daily. 90 tablet 1  . blood glucose meter kit and supplies Dispense based on patient and insurance preference. Use up to four times daily as directed. (FOR ICD-10 E10.9, E11.9). 1 each 0  . clopidogrel (PLAVIX) 75 MG tablet Take 1 tablet (75 mg total) by mouth daily. 90 tablet 3  . glucose blood test strip 1 each by Other route in the morning and at bedtime. Use to test blood sugar twice daily. 100 each 3  .  insulin glargine (LANTUS) 100 UNIT/ML Solostar Pen Inject 20 Units into the skin at bedtime. 15 mL 11  . Iron-Vitamin C (VITRON-C) 65-125 MG TABS Take 1 tablet by mouth daily. 60 tablet 0  . metFORMIN (GLUCOPHAGE-XR) 500 MG 24 hr tablet Take 1 tablet (500 mg total) by mouth 2 (two) times daily after a meal. 270 tablet 1  . metoprolol succinate (TOPROL XL) 25 MG 24 hr tablet Take 1 tablet (25 mg total) by mouth daily. 90 tablet 3  . nicotine (NICODERM CQ - DOSED IN MG/24 HOURS) 21 mg/24hr patch Place 1 patch (21 mg total) onto the skin daily. 28 patch 0  . pantoprazole (PROTONIX) 40 MG tablet Take 1 tablet (40 mg total) by mouth daily. 30 tablet 3  . Semaglutide,0.25 or 0.5MG/DOS, (OZEMPIC, 0.25 OR 0.5 MG/DOSE,) 2 MG/1.5ML SOPN Inject 0.5 mg into the skin once a week. 1.5 mL 1  . sildenafil (VIAGRA) 100 MG tablet Take 1 tablet (100 mg total) by mouth daily as needed for erectile dysfunction. 30 tablet 0  . tamsulosin (FLOMAX) 0.4 MG CAPS capsule Take 1 capsule (0.4 mg total) by mouth daily. 90 capsule 3  . Tiotropium Bromide-Olodaterol (STIOLTO RESPIMAT) 2.5-2.5 MCG/ACT AERS Inhale 2 puffs into the lungs daily. 1 each 0  . varenicline (CHANTIX) 1 MG tablet Take 1 tablet (1 mg total) by mouth 2 (two) times daily. 180 tablet 1  .  Vitamin D, Ergocalciferol, (DRISDOL) 1.25 MG (50000 UNIT) CAPS capsule Take 1 capsule (50,000 Units total) by mouth every 7 (seven) days. 8 capsule 0  . albuterol (VENTOLIN HFA) 108 (90 Base) MCG/ACT inhaler Inhale 2 puffs into the lungs every 6 (six) hours as needed for wheezing or shortness of breath. (Patient not taking: No sig reported) 6.7 g 3   No current facility-administered medications for this visit.    Allergies as of 03/16/2020 - Review Complete 03/16/2020  Allergen Reaction Noted  . Wasp venom Anaphylaxis 11/02/2014  . Wasp venom protein Anaphylaxis 11/25/2014  . Coffee flavor Nausea And Vomiting 08/01/2019  . Onion  05/25/2018    ROS:  General: Negative  for anorexia, weight loss, fever, chills, fatigue, weakness. ENT: Negative for hoarseness, difficulty swallowing , nasal congestion. CV: Negative for chest pain, angina, palpitations, dyspnea on exertion, peripheral edema.  Respiratory: Negative for dyspnea at rest, dyspnea on exertion, cough, sputum, wheezing.  GI: See history of present illness. GU:  Negative for dysuria, hematuria, urinary incontinence, urinary frequency, nocturnal urination.  Endo: Negative for unusual weight change.    Physical Examination:   BP 109/72   Pulse (!) 109   Ht _0  (1.626 m)   Wt 183 lb 3.2 oz (83.1 kg)   BMI 31.45 kg/m   General: Well-nourished, well-developed in no acute distress.  Eyes: No icterus. Conjunctivae pink. Lungs: Clear to auscultation bilaterally. Non-labored. Heart: Regular rate and rhythm, no murmurs rubs or gallops.  Abdomen: Bowel sounds are normal, nontender, nondistended, no hepatosplenomegaly or masses, no abdominal bruits or hernia , no rebound or guarding.   Extremities: No lower extremity edema. No clubbing or deformities. Neuro: Alert and oriented x 3.  Grossly intact. Skin: Warm and dry, no jaundice.   Psych: Alert and cooperative, normal mood and affect.  Labs:    Imaging Studies: No results found.  Assessment and Plan:   Benjamin Conley is a 55 y.o. y/o male who comes in with a history of having an EGD and colonoscopy 6 months ago with AVMs found and treated at that time. The patient reports that he was taking medication that he thought made his bleeding occur and since he has stopped that he has had no further bleeding.  The patient will have his blood count checked to look for any anemia.  The patient has had an EGD and colonoscopy in the recent past.  If his blood count shows anemia then he may need a capsule endoscopy.  If his hemoglobin is stable then continue observation of his blood count would be recommended.  The patient has been explained the plan and  agrees with it.     [name], MD. [name]    Note: This dictation was prepared with Dragon dictation along with smaller phrase technology. Any transcriptional errors that result from this process are unintentional.

## 2020-03-17 LAB — CBC WITH DIFFERENTIAL/PLATELET
Basophils Absolute: 0.1 10*3/uL (ref 0.0–0.2)
Basos: 1 %
EOS (ABSOLUTE): 0.1 10*3/uL (ref 0.0–0.4)
Eos: 2 %
Hematocrit: 35.9 % — ABNORMAL LOW (ref 37.5–51.0)
Hemoglobin: 11.8 g/dL — ABNORMAL LOW (ref 13.0–17.7)
Immature Grans (Abs): 0 10*3/uL (ref 0.0–0.1)
Immature Granulocytes: 0 %
Lymphocytes Absolute: 3.6 10*3/uL — ABNORMAL HIGH (ref 0.7–3.1)
Lymphs: 44 %
MCH: 29.2 pg (ref 26.6–33.0)
MCHC: 32.9 g/dL (ref 31.5–35.7)
MCV: 89 fL (ref 79–97)
Monocytes Absolute: 0.7 10*3/uL (ref 0.1–0.9)
Monocytes: 9 %
Neutrophils Absolute: 3.7 10*3/uL (ref 1.4–7.0)
Neutrophils: 44 %
Platelets: 540 10*3/uL — ABNORMAL HIGH (ref 150–450)
RBC: 4.04 x10E6/uL — ABNORMAL LOW (ref 4.14–5.80)
RDW: 13.4 % (ref 11.6–15.4)
WBC: 8.2 10*3/uL (ref 3.4–10.8)

## 2020-03-21 ENCOUNTER — Ambulatory Visit: Payer: Medicare HMO | Admitting: Pharmacist

## 2020-03-21 ENCOUNTER — Telehealth: Payer: Self-pay

## 2020-03-21 DIAGNOSIS — D735 Infarction of spleen: Secondary | ICD-10-CM

## 2020-03-21 DIAGNOSIS — E119 Type 2 diabetes mellitus without complications: Secondary | ICD-10-CM

## 2020-03-21 DIAGNOSIS — J449 Chronic obstructive pulmonary disease, unspecified: Secondary | ICD-10-CM

## 2020-03-21 DIAGNOSIS — J4489 Other specified chronic obstructive pulmonary disease: Secondary | ICD-10-CM

## 2020-03-21 DIAGNOSIS — I10 Essential (primary) hypertension: Secondary | ICD-10-CM

## 2020-03-21 DIAGNOSIS — E785 Hyperlipidemia, unspecified: Secondary | ICD-10-CM

## 2020-03-21 DIAGNOSIS — Z720 Tobacco use: Secondary | ICD-10-CM

## 2020-03-21 NOTE — Telephone Encounter (Signed)
-----   Message from [name], MD sent at 03/20/2020 12:57 PM EST ----- Let the patient know that his blood work showed his hemoglobin to be unchanged from a month ago.  This means his blood count is stable.  Let him notify us if he has any further sign of bleeding.

## 2020-03-21 NOTE — Patient Instructions (Signed)
Visit Information  Patient Care Plan: Medication Management    Problem Identified: Diabetes, Tobacco Abuse, Splenic Infarct, Tachycardia     Long-Range Goal: Disease Progression Prevention   This Visit's Progress: On track  Recent Progress: On track  Priority: High  Note:   Current Barriers:  . Unable to independently monitor therapeutic efficacy . Unable to achieve control of diabetes  . Suboptimal cardiovascular risk reduction  Pharmacist Clinical Goal(s):  [name] Over the next 90 days, patient will achieve adherence to monitoring guidelines and medication adherence to achieve therapeutic efficacy. . Over the next 90 days, patient will achieve control of diabetes as evidenced by improvement in A1c through collaboration with PharmD and provider.   Interventions: . 1:1 collaboration with covering provider, [name], MD regarding development and update of comprehensive plan of care as evidenced by provider attestation and co-signature . Inter-disciplinary care team collaboration (see longitudinal plan of care) . Comprehensive medication review performed; medication list updated in electronic medical record  Health Maintenance: . Provided phone number to establish with [facility]. Will provide warm hand off to embedded CCM team there.  Diabetes: . Uncontrolled; current treatment: metformin XR 500 mg daily - notes this is the maximum tolerated dose without diarrhea; Ozempic 0.5 mg, Lantus 16 units daily  . Current glucose readings- reports elevation over the past few weeks, but cannot determine why: fasting glucose: 140-150s post prandial glucose: 180-200s . Denies any significant changes in diet or s/sx infection. Does note occasionally "redness and itching" on his ankles. Comes and goes. Currnently dealing with an episode, endorses a 4 day history. Relieved by topical antiitch cream. Denies seeing any bugs, changes in detergent or soaps. Discussed seeking evaluation if  he is concerned, he notes he will conside rit.  . Noted that Lantus script is written to take up to 20 units daily, advised patient that he could increase to 20 units daily given sugar elevations. Continue Ozempic 0.5 mg weekly, metformin XR 500 mg daily. Consider use of SGLT2 moving forward. Will avoid addition of any medications at this time, as patient will be establishing with a new PCP.   Hypertension: . Controlled per home readings; current treatment: metformin succinate 25 mg daily  . Current home readings: SBP 100-120/60-70s, HR remaining 90-110s. Denies any questions or concerns at this time.  . Encouraged to keep cardiology f/u in January as scheduled. Recommended to continue current regimen at this time  Hyperlipidemia, hx splenic infarct . Controlled per last lipid panel; current treatment: atorvastatin 40 mg daily . Antiplatelet regimen: aspirin 81 mg daily, clopidogrel 75  mg daily  . Recommended to continue current regimen at this time  Chronic Obstructive Pulmonary Disease, hx Tobacco Abuse . Confirms avoidance of tobacco products since 02/12/20. Notes he has a few more days of nicotine 21 mg patches, but then plans to step down to 14 mg patches. Continues varenicline 1 mg BID as well.  . Notes when he gets the urge to roll a cigarette, he gets on the computer, plays a game, or takes a nap to distract himself. Confirms his brother still lives with him, smokes, and rolls cigarettes, but he is encouraging him to pursue cessation as well  . COPD appropriately treated; current treatment: Stiolto 2.5/2.5 mcg 2 puffs daily. Patient has not been taking recently and has not needed albuterol. Reviewed to restart therapy if resurgence of cough, SOB . Recommend to continue nicotine patch taper per OTC instructions. Continue concurrent varenicline therapy for at least 3-6  months s/p cessation.   . Reviewed to contact [place] Quit line to talk to counselors for behavioral support, other  hobbies/strategies to help support cessation attempts.    Patient Goals/Self-Care Activities . Over the next 90 days, patient will:  - take medications as prescribed check blood glucose daily, document, and provide at future appointments check blood pressure daily, document, and provide at future appointments  Follow Up Plan: Patient to establish with new PCP       The patient verbalized understanding of instructions, educational materials, and care plan provided today and declined offer to receive copy of patient instructions, educational materials, and care plan.  Follow Up:  Patient agrees to Care Plan and Follow-up with new PCP office   Plan: patient to establish with new PCP  Catie Darnelle Maffucci, PharmD, [place], [place] Pharmacist [employer] at [facility] [phone]

## 2020-03-21 NOTE — Telephone Encounter (Signed)
Left vm for pt to return my call. A mychart message was sent to the patient as well with results.

## 2020-03-21 NOTE — Chronic Care Management (AMB) (Signed)
Chronic Care Management Pharmacy Note  03/21/2020 Name:  Benjamin Conley MRN:  [id number] DOB:  30-Sep-1965  Subjective: Benjamin Conley is an 55 y.o. year old male who is a primary patient of [name], NP.  Collaborating with covering provider [name], MD. The CCM team was consulted for assistance with disease management and care coordination needs.    Engaged with patient by telephone for follow up visit in response to provider referral for pharmacy case management and/or care coordination services.   Consent to Services:  The patient was given information about Chronic Care Management services, agreed to services, and gave verbal consent prior to initiation of services.  Please see initial visit note for detailed documentation.   Objective:  Lab Results  Component Value Date   CREATININE 0.66 (L) 01/21/2020   CREATININE 0.70 10/21/2019   CREATININE 0.64 09/23/2019    Lab Results  Component Value Date   HGBA1C 8.0 (H) 01/21/2020       Component Value Date/Time   CHOL 94 01/21/2020 1505   TRIG 98 01/21/2020 1505   HDL 36 (L) 01/21/2020 1505   CHOLHDL 2.6 01/21/2020 1505   VLDL 28.4 10/21/2019 1432   LDLCALC 40 01/21/2020 1505    BP Readings from Last 3 Encounters:  03/16/20 109/72  01/27/20 122/74  01/21/20 (!) 147/90    Assessment/Interventions: Review of patient past medical history, allergies, medications, health status, including review of consultants reports, laboratory and other test data, was performed as part of comprehensive evaluation and provision of chronic care management services.   SDOH:  (Social Determinants of Health) assessments and interventions performed:  SDOH Interventions   Flowsheet Row Most Recent Value  SDOH Interventions   SDOH Interventions for the Following Domains Tobacco  Financial Strain Interventions Intervention Not Indicated  Tobacco Interventions Cessation Materials Given and Reviewed      CCM Care  Plan  Allergies  Allergen Reactions  . Wasp Venom Anaphylaxis  . Wasp Venom Protein Anaphylaxis  . Coffee Flavor Nausea And Vomiting    Patient states coffee gives him nausea and stomach cramps  . Onion     Medications Reviewed Today    Reviewed by [name], RPH-CPP (Pharmacist) on 03/21/20 at 1544  Med List Status: <None>  Medication Order Taking? Sig Documenting Provider Last Dose Status Informant  albuterol (VENTOLIN HFA) 108 (90 Base) MCG/ACT inhaler [id number]  Inhale 2 puffs into the lungs every 6 (six) hours as needed for wheezing or shortness of breath.  Patient not taking: No sig reported   [name], NP  Active   aspirin EC 81 MG EC tablet [id number] Yes Take 1 tablet (81 mg total) by mouth daily. [name], MD Taking Active Self  atorvastatin (LIPITOR) 40 MG tablet [id number] Yes Take 1 tablet (40 mg total) by mouth daily. [name], NP Taking Active   blood glucose meter kit and supplies [id number] Yes Dispense based on patient and insurance preference. Use up to four times daily as directed. (FOR ICD-10 E10.9, E11.9). [name], NP Taking Active   clopidogrel (PLAVIX) 75 MG tablet [id number] Yes Take 1 tablet (75 mg total) by mouth daily. [name], NP Taking Active   glucose blood test strip [id number] Yes 1 each by Other route in the morning and at bedtime. Use to test blood sugar twice daily. [name], NP Taking Active   insulin glargine (LANTUS) 100 UNIT/ML Solostar Pen [id number] Yes Inject 20 Units into  the skin at bedtime. [name], NP Taking Active            Med Note Lula Olszewski Mar 21, 2020  3:34 PM) 16 units  Iron-Vitamin C (VITRON-C) 65-125 MG TABS [id number] Yes Take 1 tablet by mouth daily. [name], NP Taking Active   metFORMIN (GLUCOPHAGE-XR) 500 MG 24 hr tablet [id number] Yes Take 1 tablet (500 mg total) by mouth 2 (two) times daily after a meal. [name], NP Taking Active             Med Note Lula Olszewski Mar 21, 2020  3:34 PM) Taking total of 500 mg daily   metoprolol succinate (TOPROL XL) 25 MG 24 hr tablet [id number] Yes Take 1 tablet (25 mg total) by mouth daily. [name], MD Taking Active Self  nicotine (NICODERM CQ - DOSED IN MG/24 HOURS) 21 mg/24hr patch [id number] Yes Place 1 patch (21 mg total) onto the skin daily. [name], NP Taking Active   pantoprazole (PROTONIX) 40 MG tablet [id number] Yes Take 1 tablet (40 mg total) by mouth daily. [name], MD Taking Active   Semaglutide,0.25 or 0.5MG/DOS, (OZEMPIC, 0.25 OR 0.5 MG/DOSE,) 2 MG/1.5ML SOPN [id number] Yes Inject 0.5 mg into the skin once a week. [name], MD Taking Active   sildenafil (VIAGRA) 100 MG tablet [id number] Yes Take 1 tablet (100 mg total) by mouth daily as needed for erectile dysfunction. [name], PA-C Taking Active   tamsulosin (FLOMAX) 0.4 MG CAPS capsule [id number] Yes Take 1 capsule (0.4 mg total) by mouth daily. [name], MD Taking Active   Tiotropium Bromide-Olodaterol (STIOLTO RESPIMAT) 2.5-2.5 MCG/ACT AERS [id number] Yes Inhale 2 puffs into the lungs daily. [name], MD Taking Active   varenicline (CHANTIX) 1 MG tablet [id number] Yes Take 1 tablet (1 mg total) by mouth 2 (two) times daily. [name], MD Taking Active   Vitamin D, Ergocalciferol, (DRISDOL) 1.25 MG (50000 UNIT) CAPS capsule [id number] Yes Take 1 capsule (50,000 Units total) by mouth every 7 (seven) days. [name], NP Taking Active           Patient Active Problem List   Diagnosis Date Noted  . Leukocytosis 01/30/2020  . Nicotine use disorder 01/27/2020  . Slow transit constipation 01/27/2020  . Nail avulsion of toe, initial encounter 12/09/2019  . Benign prostatic hyperplasia with post-void dribbling 10/30/2019  . Abnormal ejaculation 10/21/2019  . B12 deficiency 10/21/2019  . Rectal pressure 10/21/2019  . Thrombosis of  splenic artery ([place]) 10/03/2019  . Abnormal brain MRI 09/22/2019  . Difficulty sleeping 09/22/2019  . Tinnitus of both ears 09/22/2019  . Pain due to onychomycosis of toenails of both feet 09/06/2019  . Diabetic neuropathy ([place]) 09/06/2019  . Coagulation disorder ([place]) 09/06/2019  . Iron deficiency anemia due to chronic blood loss 08/29/2019  . Thrombocytosis 08/24/2019  . Angiodysplasia of intestinal tract   . Black stool 08/22/2019  . Presence of arterial stent - splenic artery 08/21/2019  . Dizziness 08/19/2019  . 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  . COPD with chronic bronchitis ([place]) 08/01/2019  . Tobacco abuse 08/01/2019  . Diabetes mellitus without complication ([place])   . Hyperlipidemia   . Impingement syndrome of shoulder region 03/30/2018  . Hypertension 11/10/2013    Conditions to be addressed/monitored:  HTN, HLD, COPD, DMII and tobacco use  Patient Care Plan: Medication Management    Problem Identified: Diabetes,  Tobacco Abuse, Splenic Infarct, Tachycardia     Long-Range Goal: Disease Progression Prevention   This Visit's Progress: On track  Recent Progress: On track  Priority: High  Note:   Current Barriers:  . Unable to independently monitor therapeutic efficacy . Unable to achieve control of diabetes  . Suboptimal cardiovascular risk reduction  Pharmacist Clinical Goal(s):  [name] Over the next 90 days, patient will achieve adherence to monitoring guidelines and medication adherence to achieve therapeutic efficacy. . Over the next 90 days, patient will achieve control of diabetes as evidenced by improvement in A1c through collaboration with PharmD and provider.   Interventions: . 1:1 collaboration with covering provider, Marikay Alar, MD regarding development and update of comprehensive plan of care as evidenced by provider attestation and co-signature . Inter-disciplinary care team collaboration (see longitudinal plan of care) . Comprehensive medication review  performed; medication list updated in electronic medical record  Health Maintenance: . Provided phone number to establish with [facility]. Will provide warm hand off to embedded CCM team there.  Diabetes: . Uncontrolled; current treatment: metformin XR 500 mg daily - notes this is the maximum tolerated dose without diarrhea; Ozempic 0.5 mg, Lantus 16 units daily  . Current glucose readings- reports elevation over the past few weeks, but cannot determine why: fasting glucose: 140-150s post prandial glucose: 180-200s . Denies any significant changes in diet or s/sx infection. Does note occasionally "redness and itching" on his ankles. Comes and goes. Currnently dealing with an episode, endorses a 4 day history. Relieved by topical antiitch cream. Denies seeing any bugs, changes in detergent or soaps. Discussed seeking evaluation if he is concerned, he notes he will conside rit.  . Noted that Lantus script is written to take up to 20 units daily, advised patient that he could increase to 20 units daily given sugar elevations. Continue Ozempic 0.5 mg weekly, metformin XR 500 mg daily. Consider use of SGLT2 moving forward. Will avoid addition of any medications at this time, as patient will be establishing with a new PCP.   Hypertension: . Controlled per home readings; current treatment: metformin succinate 25 mg daily  . Current home readings: SBP 100-120/60-70s, HR remaining 90-110s. Denies any questions or concerns at this time.  . Encouraged to keep cardiology f/u in January as scheduled. Recommended to continue current regimen at this time  Hyperlipidemia, hx splenic infarct . Controlled per last lipid panel; current treatment: atorvastatin 40 mg daily . Antiplatelet regimen: aspirin 81 mg daily, clopidogrel 75  mg daily  . Recommended to continue current regimen at this time  Chronic Obstructive Pulmonary Disease, hx Tobacco Abuse . Confirms avoidance of tobacco products since  02/12/20. Notes he has a few more days of nicotine 21 mg patches, but then plans to step down to 14 mg patches. Continues varenicline 1 mg BID as well.  . Notes when he gets the urge to roll a cigarette, he gets on the computer, plays a game, or takes a nap to distract himself. Confirms his brother still lives with him, smokes, and rolls cigarettes, but he is encouraging him to pursue cessation as well  . COPD appropriately treated; current treatment: Stiolto 2.5/2.5 mcg 2 puffs daily. Patient has not been taking recently and has not needed albuterol. Reviewed to restart therapy if resurgence of cough, SOB . Recommend to continue nicotine patch taper per OTC instructions. Continue concurrent varenicline therapy for at least 3-6 months s/p cessation.   . Reviewed to contact [place] Quit line to  talk to counselors for behavioral support, other hobbies/strategies to help support cessation attempts.    Patient Goals/Self-Care Activities . Over the next 90 days, patient will:  - take medications as prescribed check blood glucose daily, document, and provide at future appointments check blood pressure daily, document, and provide at future appointments  Follow Up Plan: Patient to establish with new PCP       Medication Assistance: None required. Patient affirms current coverage meets needs.   Follow Up:  Patient agrees to Care Plan and Follow-up with new PCP office   Plan: patient to establish with new PCP  Catie Darnelle Maffucci, PharmD, [place], [place] Pharmacist [employer] at [facility] [phone]

## 2020-03-26 ENCOUNTER — Other Ambulatory Visit: Payer: Self-pay | Admitting: Oncology

## 2020-03-29 ENCOUNTER — Inpatient Hospital Stay: Payer: Medicare HMO | Attending: Oncology

## 2020-03-29 DIAGNOSIS — Z7984 Long term (current) use of oral hypoglycemic drugs: Secondary | ICD-10-CM | POA: Diagnosis not present

## 2020-03-29 DIAGNOSIS — Z79899 Other long term (current) drug therapy: Secondary | ICD-10-CM | POA: Insufficient documentation

## 2020-03-29 DIAGNOSIS — J449 Chronic obstructive pulmonary disease, unspecified: Secondary | ICD-10-CM | POA: Insufficient documentation

## 2020-03-29 DIAGNOSIS — D75839 Thrombocytosis, unspecified: Secondary | ICD-10-CM | POA: Insufficient documentation

## 2020-03-29 DIAGNOSIS — I471 Supraventricular tachycardia: Secondary | ICD-10-CM | POA: Insufficient documentation

## 2020-03-29 DIAGNOSIS — E538 Deficiency of other specified B group vitamins: Secondary | ICD-10-CM | POA: Insufficient documentation

## 2020-03-29 DIAGNOSIS — D509 Iron deficiency anemia, unspecified: Secondary | ICD-10-CM | POA: Diagnosis not present

## 2020-03-29 DIAGNOSIS — E785 Hyperlipidemia, unspecified: Secondary | ICD-10-CM | POA: Insufficient documentation

## 2020-03-29 DIAGNOSIS — E119 Type 2 diabetes mellitus without complications: Secondary | ICD-10-CM | POA: Diagnosis not present

## 2020-03-29 DIAGNOSIS — D500 Iron deficiency anemia secondary to blood loss (chronic): Secondary | ICD-10-CM

## 2020-03-29 LAB — CBC WITH DIFFERENTIAL/PLATELET
Abs Immature Granulocytes: 0.03 10*3/uL (ref 0.00–0.07)
Basophils Absolute: 0.1 10*3/uL (ref 0.0–0.1)
Basophils Relative: 1 %
Eosinophils Absolute: 0.1 10*3/uL (ref 0.0–0.5)
Eosinophils Relative: 1 %
HCT: 35.7 % — ABNORMAL LOW (ref 39.0–52.0)
Hemoglobin: 11.8 g/dL — ABNORMAL LOW (ref 13.0–17.0)
Immature Granulocytes: 0 %
Lymphocytes Relative: 36 %
Lymphs Abs: 3.2 10*3/uL (ref 0.7–4.0)
MCH: 29.5 pg (ref 26.0–34.0)
MCHC: 33.1 g/dL (ref 30.0–36.0)
MCV: 89.3 fL (ref 80.0–100.0)
Monocytes Absolute: 0.8 10*3/uL (ref 0.1–1.0)
Monocytes Relative: 10 %
Neutro Abs: 4.5 10*3/uL (ref 1.7–7.7)
Neutrophils Relative %: 52 %
Platelets: 503 10*3/uL — ABNORMAL HIGH (ref 150–400)
RBC: 4 MIL/uL — ABNORMAL LOW (ref 4.22–5.81)
RDW: 13.7 % (ref 11.5–15.5)
WBC: 8.7 10*3/uL (ref 4.0–10.5)
nRBC: 0 % (ref 0.0–0.2)

## 2020-03-29 LAB — IRON AND TIBC
Iron: 35 ug/dL — ABNORMAL LOW (ref 45–182)
Saturation Ratios: 8 % — ABNORMAL LOW (ref 17.9–39.5)
TIBC: 465 ug/dL — ABNORMAL HIGH (ref 250–450)
UIBC: 430 ug/dL

## 2020-03-29 LAB — FERRITIN: Ferritin: 7 ng/mL — ABNORMAL LOW (ref 24–336)

## 2020-03-29 LAB — VITAMIN B12: Vitamin B-12: 235 pg/mL (ref 180–914)

## 2020-03-30 ENCOUNTER — Encounter: Payer: Self-pay | Admitting: Cardiology

## 2020-03-30 ENCOUNTER — Ambulatory Visit (INDEPENDENT_AMBULATORY_CARE_PROVIDER_SITE_OTHER): Payer: Medicare HMO | Admitting: Cardiology

## 2020-03-30 ENCOUNTER — Other Ambulatory Visit: Payer: Self-pay

## 2020-03-30 VITALS — BP 140/90 | HR 95 | Ht 64.0 in | Wt 185.0 lb

## 2020-03-30 DIAGNOSIS — I728 Aneurysm of other specified arteries: Secondary | ICD-10-CM | POA: Diagnosis not present

## 2020-03-30 DIAGNOSIS — R000 Tachycardia, unspecified: Secondary | ICD-10-CM

## 2020-03-30 DIAGNOSIS — E7800 Pure hypercholesterolemia, unspecified: Secondary | ICD-10-CM | POA: Diagnosis not present

## 2020-03-30 DIAGNOSIS — F17200 Nicotine dependence, unspecified, uncomplicated: Secondary | ICD-10-CM | POA: Diagnosis not present

## 2020-03-30 NOTE — Patient Instructions (Signed)
Medication Instructions:  Your physician recommends that you continue on your current medications as directed. Please refer to the Current Medication list given to you today.  *If you need a refill on your cardiac medications before your next appointment, please call your pharmacy*   Lab Work: None Ordered If you have labs (blood work) drawn today and your tests are completely normal, you will receive your results only by: [name] MyChart Message (if you have MyChart) OR . A paper copy in the mail If you have any lab test that is abnormal or we need to change your treatment, we will call you to review the results.   Testing/Procedures: None Ordered   Follow-Up: At [facility], you and your health needs are our priority.  As part of our continuing mission to provide you with exceptional heart care, we have created designated Provider Care Teams.  These Care Teams include your primary Cardiologist (physician) and Advanced Practice Providers (APPs -  Physician Assistants and Nurse Practitioners) who all work together to provide you with the care you need, when you need it.  We recommend signing up for the patient portal called "MyChart".  Sign up information is provided on this After Visit Summary.  MyChart is used to connect with patients for Virtual Visits (Telemedicine).  Patients are able to view lab/test results, encounter notes, upcoming appointments, etc.  Non-urgent messages can be sent to your provider as well.   To learn more about what you can do with MyChart, go to [email or web address].    Your next appointment:   1 year(s)  The format for your next appointment:   In Person  Provider:   [name], MD   Other Instructions  Call Vascular Surgery.

## 2020-03-30 NOTE — Progress Notes (Signed)
Cardiology Office Note:    Date:  03/30/2020   ID:  Benjamin Conley, DOB 1965/05/09, MRN [id number]  PCP:  [name], NP  [facility] Cardiologist:  [name], MD  [place] Electrophysiologist:  None   Referring MD: [name], NP   Chief Complaint  Patient presents with  . Follow-up    6 month F/U-Patient states his dentist is requesting permission to stop blood thinner prior to dental procedure.    History of Present Illness:    Benjamin Conley is a 55 y.o. male with a hx of diabetes, splenic infarct s/p stent placement, COPD, hyperlipidemia, former smoker x40+ years (quit 4 months ago) who presents for follow-up.    Previously seen due to dyspnea on exertion, work-up with echo and Lexiscan Myoview were unrevealing.  Symptoms were attributed to COPD.  Diagnosed with inappropriate sinus tachycardia, started on Toprol-XL with better rates.  He feels well otherwise, has an upcoming dental procedure and would like to know when to hold aspirin, Plavix since he has a splenic artery stent placed.  He has not smoked for the past 4 months.   Prior notes Echo 0/3754 normal systolic and diastolic function, EF 55 to 60%. Lexiscan Myoview 08/2019 low risk, no evidence for ischemia, no significant coronary artery calcification  Patient had worsening left upper quadrant abdominal pain.  He presented to the ED  where CT abdomen dated 07/2019 showed distal splenic aneurysm no greater than 6 mm, small dissection, likely source of splenic infarctions.  He was diagnosed with splenic infarct and had a stent placed to the splenic artery.  His abdominal pain symptoms have improved somewhat, he has occasional left-sided chest discomfort which he attributes to the splenic infarct.  He states having shortness of breath with exertion which is ongoing for some time now.  He attributes the shortness of breath to COPD.  He is a current smoker. Denies palpitations or irregular  heartbeats.  States his heart rates have been elevated over the past 2 to 3 weeks, up to 120 bpm upon checks at home.   Past Medical History:  Diagnosis Date  . Acute posthemorrhagic anemia   . Complication of anesthesia    c/o difficulty breathing after anesthesia  . COPD (chronic obstructive pulmonary disease) ([place])   . Diabetes mellitus without complication ([place])   . Hyperlipemia   . Melena 08/21/2019  . Paroxysmal supraventricular tachycardia ([place]) 08/19/2019  . Postural dizziness with presyncope 08/21/2019  . 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 Tobacco abuse   . Upper GI bleed 08/21/2019    Past Surgical History:  Procedure Laterality Date  . BACK SURGERY     lumbar  . COLONOSCOPY WITH PROPOFOL N/A 08/24/2019   Procedure: COLONOSCOPY WITH PROPOFOL;  Surgeon: [name], MD;  Location: [facility] ENDOSCOPY;  Service: Endoscopy;  Laterality: N/A;  . ESOPHAGOGASTRODUODENOSCOPY (EGD) WITH PROPOFOL N/A 08/24/2019   Procedure: ESOPHAGOGASTRODUODENOSCOPY (EGD) WITH PROPOFOL;  Surgeon: [name], MD;  Location: [facility] ENDOSCOPY;  Service: Endoscopy;  Laterality: N/A;  . STENT PLACEMENT VASCULAR ([place] HX)  07/2019   stenosis of distal splenic artery and stent placed  . VISCERAL ANGIOGRAPHY N/A 08/06/2019   Procedure: VISCERAL ANGIOGRAPHY;  Surgeon: [name], MD;  Location: [facility] CV LAB;  Service: Cardiovascular;  Laterality: N/A;    Current Medications: Current Meds  Medication Sig  . albuterol (VENTOLIN HFA) 108 (90 Base) MCG/ACT inhaler Inhale 2 puffs into the lungs every 6 (six) hours as needed for wheezing  or shortness of breath.  [name] aspirin EC 81 MG EC tablet Take 1 tablet (81 mg total) by mouth daily.  [name] atorvastatin (LIPITOR) 40 MG tablet Take 1 tablet (40 mg total) by mouth daily.  . blood glucose meter kit and supplies Dispense based on patient and insurance preference. Use up to four times daily as directed. (FOR ICD-10 E10.9, E11.9).  [name] clopidogrel (PLAVIX) 75 MG  tablet Take 1 tablet (75 mg total) by mouth daily.  [name] glucose blood test strip 1 each by Other route in the morning and at bedtime. Use to test blood sugar twice daily.  . insulin glargine (LANTUS) 100 UNIT/ML Solostar Pen Inject 20 Units into the skin at bedtime.  . Iron-Vitamin C (VITRON-C) 65-125 MG TABS Take 1 tablet by mouth daily.  . metFORMIN (GLUCOPHAGE-XR) 500 MG 24 hr tablet Take 250 mg by mouth 2 (two) times daily.  . metoprolol succinate (TOPROL XL) 25 MG 24 hr tablet Take 1 tablet (25 mg total) by mouth daily.  . nicotine (NICODERM CQ - DOSED IN MG/24 HOURS) 21 mg/24hr patch Place 1 patch (21 mg total) onto the skin daily.  . pantoprazole (PROTONIX) 40 MG tablet Take 1 tablet (40 mg total) by mouth daily.  . Semaglutide,0.25 or 0.5MG/DOS, (OZEMPIC, 0.25 OR 0.5 MG/DOSE,) 2 MG/1.5ML SOPN Inject 0.5 mg into the skin once a week.  . sildenafil (VIAGRA) 100 MG tablet Take 1 tablet (100 mg total) by mouth daily as needed for erectile dysfunction.  . tamsulosin (FLOMAX) 0.4 MG CAPS capsule Take 1 capsule (0.4 mg total) by mouth daily.  . Tiotropium Bromide-Olodaterol (STIOLTO RESPIMAT) 2.5-2.5 MCG/ACT AERS Inhale 2 puffs into the lungs daily.  . varenicline (CHANTIX) 1 MG tablet Take 1 tablet (1 mg total) by mouth 2 (two) times daily.     Allergies:   Wasp venom, Wasp venom protein, Coffee flavor, and Onion   Social History   Socioeconomic History  . Marital status: Single    Spouse name: Not on file  . Number of children: Not on file  . Years of education: Not on file  . Highest education level: Not on file  Occupational History  . Not on file  Tobacco Use  . Smoking status: Former Smoker    Packs/day: 1.00    Years: 48.00    Pack years: 48.00    Types: Cigarettes    Quit date: 02/12/2020    Years since quitting: 0.1  . Smokeless tobacco: Never Used  Vaping Use  . Vaping Use: Never used  Substance and Sexual Activity  . Alcohol use: No    Comment: Never been a problem   . Drug use: No  . Sexual activity: Yes  Other Topics Concern  . Not on file  Social History Narrative   Single, lives alone, unemployed.   Social Determinants of Health   Financial Resource Strain: Low Risk   . Difficulty of Paying Living Expenses: Not hard at all  Food Insecurity: No Food Insecurity  . Worried About Charity fundraiser in the Last Year: Never true  . Ran Out of Food in the Last Year: Never true  Transportation Needs: No Transportation Needs  . Lack of Transportation (Medical): No  . Lack of Transportation (Non-Medical): No  Physical Activity: Not on file  Stress: Stress Concern Present  . Feeling of Stress : Very much  Social Connections: Not on file     Family History: The patient's family history includes Colon cancer in his  maternal aunt and maternal uncle; Diabetes Mellitus II in his brother and mother; Hyperlipidemia in his brother.  ROS:   Please see the history of present illness.     All other systems reviewed and are negative.  EKGs/Labs/Other Studies Reviewed:    The following studies were reviewed today:   EKG:  EKG is  ordered today.  The ekg ordered today demonstrates sinus rhythm, heart rate 90  Recent Labs: 08/07/2019: Magnesium 2.1 08/20/2019: TSH 1.68 09/23/2019: B Natriuretic Peptide 35.9 01/21/2020: ALT 14; BUN 9; Creat 0.66; Potassium 4.0; Sodium 138 03/29/2020: Hemoglobin 11.8; Platelets 503  Recent Lipid Panel    Component Value Date/Time   CHOL 94 01/21/2020 1505   TRIG 98 01/21/2020 1505   HDL 36 (L) 01/21/2020 1505   CHOLHDL 2.6 01/21/2020 1505   VLDL 28.4 10/21/2019 1432   LDLCALC 40 01/21/2020 1505    Physical Exam:    VS:  BP 140/90 (BP Location: Left Arm, Patient Position: Sitting, Cuff Size: Normal)   Pulse 95   Ht _0  (1.626 m)   Wt 185 lb (83.9 kg)   SpO2 95%   BMI 31.76 kg/m     Wt Readings from Last 3 Encounters:  03/30/20 185 lb (83.9 kg)  03/16/20 183 lb 3.2 oz (83.1 kg)  01/27/20 181 lb (82.1 kg)      GEN:  Well nourished, well developed in no acute distress HEENT: Normal NECK: No JVD; No carotid bruits LYMPHATICS: No lymphadenopathy CARDIAC: Tachycardic, regular, no murmurs, rubs, gallops RESPIRATORY:  Clear to auscultation without rales, wheezing or rhonchi  ABDOMEN: Soft, non-tender, non-distended MUSCULOSKELETAL:  No edema; No deformity  SKIN: Warm and dry NEUROLOGIC:  Alert and oriented x 3 PSYCHIATRIC:  Normal affect   ASSESSMENT:    1. Inappropriate sinus tachycardia   2. Pure hypercholesterolemia   3. Smoking   4. Splenic artery aneurysm (HCC)    PLAN:    In order of problems listed above:  1. Previously elevated heart rates, inappropriate sinus tach, now with controlled heart rates.  Continue Toprol-XL 69m qd. 2. hyperlipidemia continue Lipitor as prescribed. 3. Patient quit smoking 4 months ago.  He was congratulated on this major achievement.  Counseled and advised to stay smoking free. 4. Splenic artery aneurysm, dissection status post stent placement.  On aspirin, Plavix, Lipitor.  Follows up with vascular surgery.  Will defer to vascular surgery as to when to stop aspirin, Plavix pending upcoming dental procedure.  Follow-up in 12 months  Medication Adjustments/Labs and Tests Ordered: Current medicines are reviewed at length with the patient today.  Concerns regarding medicines are outlined above.  Orders Placed This Encounter  Procedures  . EKG 12-Lead   No orders of the defined types were placed in this encounter.   Patient Instructions  Medication Instructions:  Your physician recommends that you continue on your current medications as directed. Please refer to the Current Medication list given to you today.  *If you need a refill on your cardiac medications before your next appointment, please call your pharmacy*   Lab Work: None Ordered If you have labs (blood work) drawn today and your tests are completely normal, you will receive your  results only by: .Marland KitchenMyChart Message (if you have MyChart) OR . A paper copy in the mail If you have any lab test that is abnormal or we need to change your treatment, we will call you to review the results.   Testing/Procedures: None Ordered   Follow-Up: At [facility], you and your health needs are our priority.  As part of our continuing mission to provide you with exceptional heart care, we have created designated Provider Care Teams.  These Care Teams include your primary Cardiologist (physician) and Advanced Practice Providers (APPs -  Physician Assistants and Nurse Practitioners) who all work together to provide you with the care you need, when you need it.  We recommend signing up for the patient portal called "MyChart".  Sign up information is provided on this After Visit Summary.  MyChart is used to connect with patients for Virtual Visits (Telemedicine).  Patients are able to view lab/test results, encounter notes, upcoming appointments, etc.  Non-urgent messages can be sent to your provider as well.   To learn more about what you can do with MyChart, go to [email or web address].    Your next appointment:   1 year(s)  The format for your next appointment:   In Person  Provider:   [name], MD   Other Instructions  Call Vascular Surgery.     Signed, [name], MD  03/30/2020 1:34 PM    [facility]

## 2020-03-31 ENCOUNTER — Other Ambulatory Visit: Payer: Self-pay | Admitting: Oncology

## 2020-03-31 ENCOUNTER — Inpatient Hospital Stay: Payer: Medicare HMO

## 2020-03-31 ENCOUNTER — Encounter: Payer: Self-pay | Admitting: Oncology

## 2020-03-31 ENCOUNTER — Inpatient Hospital Stay (HOSPITAL_BASED_OUTPATIENT_CLINIC_OR_DEPARTMENT_OTHER): Payer: Medicare HMO | Admitting: Oncology

## 2020-03-31 VITALS — BP 142/89 | HR 103 | Temp 98.0°F | Resp 20 | Wt 188.0 lb

## 2020-03-31 VITALS — BP 132/83 | HR 98 | Resp 18

## 2020-03-31 DIAGNOSIS — I471 Supraventricular tachycardia: Secondary | ICD-10-CM | POA: Diagnosis not present

## 2020-03-31 DIAGNOSIS — Z7984 Long term (current) use of oral hypoglycemic drugs: Secondary | ICD-10-CM | POA: Diagnosis not present

## 2020-03-31 DIAGNOSIS — E119 Type 2 diabetes mellitus without complications: Secondary | ICD-10-CM | POA: Diagnosis not present

## 2020-03-31 DIAGNOSIS — Z79899 Other long term (current) drug therapy: Secondary | ICD-10-CM | POA: Diagnosis not present

## 2020-03-31 DIAGNOSIS — J449 Chronic obstructive pulmonary disease, unspecified: Secondary | ICD-10-CM | POA: Diagnosis not present

## 2020-03-31 DIAGNOSIS — D500 Iron deficiency anemia secondary to blood loss (chronic): Secondary | ICD-10-CM

## 2020-03-31 DIAGNOSIS — D509 Iron deficiency anemia, unspecified: Secondary | ICD-10-CM | POA: Diagnosis not present

## 2020-03-31 DIAGNOSIS — E785 Hyperlipidemia, unspecified: Secondary | ICD-10-CM | POA: Diagnosis not present

## 2020-03-31 DIAGNOSIS — D75839 Thrombocytosis, unspecified: Secondary | ICD-10-CM | POA: Diagnosis not present

## 2020-03-31 DIAGNOSIS — E538 Deficiency of other specified B group vitamins: Secondary | ICD-10-CM | POA: Diagnosis not present

## 2020-03-31 MED ORDER — SODIUM CHLORIDE 0.9 % IV SOLN: 200.0000 mg | Freq: Once | INTRAVENOUS | Status: DC

## 2020-03-31 MED ORDER — IRON SUCROSE 20 MG/ML IV SOLN
200.0000 mg | Freq: Once | INTRAVENOUS | Status: AC
  Administered 2020-03-31: 200 mg via INTRAVENOUS
  Filled 2020-03-31: qty 10

## 2020-03-31 MED ORDER — CYANOCOBALAMIN 1000 MCG/ML IJ SOLN
1000.0000 ug | Freq: Once | INTRAMUSCULAR | Status: AC
  Administered 2020-03-31: 1000 ug via INTRAMUSCULAR
  Filled 2020-03-31: qty 1

## 2020-03-31 MED ORDER — SODIUM CHLORIDE 0.9 % IV SOLN
Freq: Once | INTRAVENOUS | Status: AC
  Filled 2020-03-31: qty 250

## 2020-03-31 NOTE — Progress Notes (Signed)
Pt tolerated venofer infusion well with no signs of complications. RN educated pt on the importance of notifying the clinic if any complications occur at home, pt verbalized understanding and all questions answered at this time. VSS. Pt stable for discharge.   Benjamin Conley [employer]

## 2020-03-31 NOTE — Progress Notes (Signed)
[place]  [phone] [phone]) [phone]  ID: [name] OB: Feb 06, 1966  MR#: [id number]  FKC#:[id number]  Patient Care Team: [name], NP as PCP - General (Nurse Practitioner) [name], MD as PCP - Cardiology (Cardiology) Lloyd Huger, MD as Consulting Physician (Oncology)  CHIEF COMPLAINT: Iron deficiency anemia, thrombocytosis.  INTERVAL HISTORY: Patient returns to clinic today for further evaluation and consideration of additional IV iron and IM B12.  He was last seen in clinic on 12/30/2019.  He received 2 doses of IV Feraheme on 09/17/2019 and 09/27/2019.  He received weekly B12 injections x 6.   In the interim, he had follow-up with vein and vascular on 01/18/2020 for previous splenic artery stent placement.  He appeared to be doing well.   Today, he continues to feel well and is asymptomatic.  He denies any weakness or fatigue.  He has no neurologic complaints. He has a good appetite and denies weight loss.  He has no chest pain, shortness of breath, cough, or hemoptysis.  He denies any nausea, vomiting, constipation, or diarrhea.  He denies any melena or hematochezia.  He has no urinary complaints.  Patient offers no specific complaints today.  REVIEW OF SYSTEMS:   Review of Systems  Constitutional: Negative.  Negative for fever, malaise/fatigue and weight loss.  Respiratory: Negative.  Negative for cough, hemoptysis and shortness of breath.   Cardiovascular: Negative.  Negative for chest pain and leg swelling.  Gastrointestinal: Negative.  Negative for abdominal pain, blood in stool and melena.  Genitourinary: Negative.  Negative for hematuria.  Musculoskeletal: Negative.  Negative for back pain.  Skin: Negative.  Negative for rash.  Neurological: Negative.  Negative for dizziness, weakness and headaches.  Psychiatric/Behavioral: Negative.  The patient is not nervous/anxious.     As per HPI. Otherwise, a  complete review of systems is negative.  PAST MEDICAL HISTORY: Past Medical History:  Diagnosis Date  . Acute posthemorrhagic anemia   . Complication of anesthesia    c/o difficulty breathing after anesthesia  . COPD (chronic obstructive pulmonary disease) ([place])   . Diabetes mellitus without complication ([place])   . Hyperlipemia   . Melena 08/21/2019  . Paroxysmal supraventricular tachycardia ([place]) 08/19/2019  . Postural dizziness with presyncope 08/21/2019  . 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 Tobacco abuse   . Upper GI bleed 08/21/2019    PAST SURGICAL HISTORY: Past Surgical History:  Procedure Laterality Date  . BACK SURGERY     lumbar  . COLONOSCOPY WITH PROPOFOL N/A 08/24/2019   Procedure: COLONOSCOPY WITH PROPOFOL;  Surgeon: [name], MD;  Location: [facility] ENDOSCOPY;  Service: Endoscopy;  Laterality: N/A;  . ESOPHAGOGASTRODUODENOSCOPY (EGD) WITH PROPOFOL N/A 08/24/2019   Procedure: ESOPHAGOGASTRODUODENOSCOPY (EGD) WITH PROPOFOL;  Surgeon: [name], MD;  Location: [facility] ENDOSCOPY;  Service: Endoscopy;  Laterality: N/A;  . STENT PLACEMENT VASCULAR ([place] HX)  07/2019   stenosis of distal splenic artery and stent placed  . VISCERAL ANGIOGRAPHY N/A 08/06/2019   Procedure: VISCERAL ANGIOGRAPHY;  Surgeon: [name], MD;  Location: [facility] CV LAB;  Service: Cardiovascular;  Laterality: N/A;    FAMILY HISTORY: Family History  Problem Relation Age of Onset  . Diabetes Mellitus II Mother   . Diabetes Mellitus II Brother   . Hyperlipidemia Brother   . Colon cancer Maternal Aunt   . Colon cancer Maternal Uncle     ADVANCED DIRECTIVES (Y/N):  N  HEALTH MAINTENANCE: Social History   Tobacco Use  . Smoking  status: Former Smoker    Packs/day: 1.00    Years: 48.00    Pack years: 48.00    Types: Cigarettes    Quit date: 02/12/2020    Years since quitting: 0.1  . Smokeless tobacco: Never Used  Vaping Use  . Vaping Use: Never used  Substance Use Topics  . Alcohol  use: No    Comment: Never been a problem  . Drug use: No     Colonoscopy:  PAP:  Bone density:  Lipid panel:  Allergies  Allergen Reactions  . Wasp Venom Anaphylaxis  . Wasp Venom Protein Anaphylaxis  . Coffee Flavor Nausea And Vomiting    Patient states coffee gives him nausea and stomach cramps  . Onion     Current Outpatient Medications  Medication Sig Dispense Refill  . albuterol (VENTOLIN HFA) 108 (90 Base) MCG/ACT inhaler Inhale 2 puffs into the lungs every 6 (six) hours as needed for wheezing or shortness of breath. 6.7 g 3  . aspirin EC 81 MG EC tablet Take 1 tablet (81 mg total) by mouth daily. 90 tablet 1  . atorvastatin (LIPITOR) 40 MG tablet Take 1 tablet (40 mg total) by mouth daily. 90 tablet 1  . blood glucose meter kit and supplies Dispense based on patient and insurance preference. Use up to four times daily as directed. (FOR ICD-10 E10.9, E11.9). 1 each 0  . clopidogrel (PLAVIX) 75 MG tablet Take 1 tablet (75 mg total) by mouth daily. 90 tablet 3  . glucose blood test strip 1 each by Other route in the morning and at bedtime. Use to test blood sugar twice daily. 100 each 3  . insulin glargine (LANTUS) 100 UNIT/ML Solostar Pen Inject 20 Units into the skin at bedtime. 15 mL 11  . Iron-Vitamin C (VITRON-C) 65-125 MG TABS Take 1 tablet by mouth daily. 60 tablet 0  . metFORMIN (GLUCOPHAGE-XR) 500 MG 24 hr tablet Take 250 mg by mouth 2 (two) times daily.    . metoprolol succinate (TOPROL XL) 25 MG 24 hr tablet Take 1 tablet (25 mg total) by mouth daily. 90 tablet 3  . nicotine (NICODERM CQ - DOSED IN MG/24 HOURS) 21 mg/24hr patch Place 1 patch (21 mg total) onto the skin daily. 28 patch 0  . pantoprazole (PROTONIX) 40 MG tablet Take 1 tablet (40 mg total) by mouth daily. 30 tablet 3  . Semaglutide,0.25 or 0.5MG/DOS, (OZEMPIC, 0.25 OR 0.5 MG/DOSE,) 2 MG/1.5ML SOPN Inject 0.5 mg into the skin once a week. 1.5 mL 1  . sildenafil (VIAGRA) 100 MG tablet Take 1 tablet (100 mg  total) by mouth daily as needed for erectile dysfunction. 30 tablet 0  . tamsulosin (FLOMAX) 0.4 MG CAPS capsule Take 1 capsule (0.4 mg total) by mouth daily. 90 capsule 3  . Tiotropium Bromide-Olodaterol (STIOLTO RESPIMAT) 2.5-2.5 MCG/ACT AERS Inhale 2 puffs into the lungs daily. 1 each 0  . varenicline (CHANTIX) 1 MG tablet Take 1 tablet (1 mg total) by mouth 2 (two) times daily. 180 tablet 1   No current facility-administered medications for this visit.    OBJECTIVE: Vitals:   03/31/20 1309  BP: (!) 142/89  Pulse: (!) 103  Resp: 20  Temp: 98 F (36.7 C)  SpO2: 100%     Body mass index is 32.27 kg/m.    ECOG FS:0 - Asymptomatic  Physical Exam Constitutional:      General: Vital signs are normal.     Appearance: Normal appearance.  HENT:  Head: Normocephalic and atraumatic.  Eyes:     Pupils: Pupils are equal, round, and reactive to light.  Cardiovascular:     Rate and Rhythm: Normal rate and regular rhythm.     Heart sounds: Normal heart sounds. No murmur heard.   Pulmonary:     Effort: Pulmonary effort is normal.     Breath sounds: Normal breath sounds. No wheezing.  Abdominal:     General: Bowel sounds are normal. There is no distension.     Palpations: Abdomen is soft.     Tenderness: There is no abdominal tenderness.  Musculoskeletal:        General: No edema. Normal range of motion.     Cervical back: Normal range of motion.  Skin:    General: Skin is warm and dry.     Findings: No rash.  Neurological:     Mental Status: He is alert and oriented to person, place, and time.  Psychiatric:        Judgment: Judgment normal.     LAB RESULTS:  Lab Results  Component Value Date   NA 138 01/21/2020   K 4.0 01/21/2020   CL 102 01/21/2020   CO2 26 01/21/2020   GLUCOSE 159 (H) 01/21/2020   BUN 9 01/21/2020   CREATININE 0.66 (L) 01/21/2020   CALCIUM 9.4 01/21/2020   PROT 6.8 01/21/2020   ALBUMIN 4.5 09/06/2019   AST 9 (L) 01/21/2020   ALT 14  01/21/2020   ALKPHOS 79 09/06/2019   BILITOT 0.3 01/21/2020   GFRNONAA >60 09/23/2019   GFRAA >60 09/23/2019    Lab Results  Component Value Date   WBC 8.7 03/29/2020   NEUTROABS 4.5 03/29/2020   HGB 11.8 (L) 03/29/2020   HCT 35.7 (L) 03/29/2020   MCV 89.3 03/29/2020   PLT 503 (H) 03/29/2020   Lab Results  Component Value Date   IRON 35 (L) 03/29/2020   TIBC 465 (H) 03/29/2020   IRONPCTSAT 8 (L) 03/29/2020   Lab Results  Component Value Date   FERRITIN 7 (L) 03/29/2020     STUDIES: No results found.  ASSESSMENT: Iron deficiency anemia, thrombocytosis. PLAN:   1. Iron deficiency anemia:  -He last received IV iron back in July 2021. -Labs from today show a hemoglobin of 11.8, ferritin of 7 and saturation of 8%. -Proceed with IV Venofer x4 doses. -He previously received IV Feraheme. -Colonoscopy and EGD from June 2021 revealed 4 angiodysplastic lesions that were cauterized.  The  2.  Thrombocytosis: -Improved, monitor.   -JAK2 mutation is negative.  3.  B12 deficiency:  -B12 level today is 235. -Proceed with monthly B12 injections. We will recheck labs at next visit.  Disposition: IV venofer and b12 injection today RTC for 4 additional doses of IV Venofer.  RTC monthly for B12 injection. RTC in 3 months for lab work, MD assessment and additional IV iron.  Patient expressed understanding and was in agreement with this plan. He also understands that He can call clinic at any time with any questions, concerns, or complaints.    Jacquelin Hawking, NP   03/31/2020 1:11 PM

## 2020-04-03 ENCOUNTER — Ambulatory Visit (INDEPENDENT_AMBULATORY_CARE_PROVIDER_SITE_OTHER): Payer: Medicare HMO | Admitting: Psychologist

## 2020-04-03 DIAGNOSIS — F411 Generalized anxiety disorder: Secondary | ICD-10-CM

## 2020-04-03 DIAGNOSIS — F17209 Nicotine dependence, unspecified, with unspecified nicotine-induced disorders: Secondary | ICD-10-CM

## 2020-04-05 ENCOUNTER — Other Ambulatory Visit: Payer: Self-pay

## 2020-04-05 ENCOUNTER — Inpatient Hospital Stay: Payer: Medicare HMO

## 2020-04-05 VITALS — BP 116/80 | HR 95 | Temp 96.5°F | Resp 18

## 2020-04-05 DIAGNOSIS — Z79899 Other long term (current) drug therapy: Secondary | ICD-10-CM | POA: Diagnosis not present

## 2020-04-05 DIAGNOSIS — E119 Type 2 diabetes mellitus without complications: Secondary | ICD-10-CM | POA: Diagnosis not present

## 2020-04-05 DIAGNOSIS — E785 Hyperlipidemia, unspecified: Secondary | ICD-10-CM | POA: Diagnosis not present

## 2020-04-05 DIAGNOSIS — D75839 Thrombocytosis, unspecified: Secondary | ICD-10-CM | POA: Diagnosis not present

## 2020-04-05 DIAGNOSIS — I471 Supraventricular tachycardia: Secondary | ICD-10-CM | POA: Diagnosis not present

## 2020-04-05 DIAGNOSIS — D509 Iron deficiency anemia, unspecified: Secondary | ICD-10-CM | POA: Diagnosis not present

## 2020-04-05 DIAGNOSIS — D500 Iron deficiency anemia secondary to blood loss (chronic): Secondary | ICD-10-CM

## 2020-04-05 DIAGNOSIS — E538 Deficiency of other specified B group vitamins: Secondary | ICD-10-CM | POA: Diagnosis not present

## 2020-04-05 DIAGNOSIS — Z7984 Long term (current) use of oral hypoglycemic drugs: Secondary | ICD-10-CM | POA: Diagnosis not present

## 2020-04-05 DIAGNOSIS — J449 Chronic obstructive pulmonary disease, unspecified: Secondary | ICD-10-CM | POA: Diagnosis not present

## 2020-04-05 MED ORDER — IRON SUCROSE 20 MG/ML IV SOLN
200.0000 mg | Freq: Once | INTRAVENOUS | Status: AC
  Administered 2020-04-05: 200 mg via INTRAVENOUS
  Filled 2020-04-05: qty 10

## 2020-04-05 MED ORDER — SODIUM CHLORIDE 0.9 % IV SOLN: 200.0000 mg | Freq: Once | INTRAVENOUS | Status: DC

## 2020-04-05 MED ORDER — SODIUM CHLORIDE 0.9 % IV SOLN
Freq: Once | INTRAVENOUS | Status: AC
  Filled 2020-04-05: qty 250

## 2020-04-05 NOTE — Progress Notes (Signed)
1428- Patient tolerated Venofer infusion well. Patient and vital signs stable. Patient discharged to home at this time.

## 2020-04-07 ENCOUNTER — Other Ambulatory Visit: Payer: Self-pay

## 2020-04-07 ENCOUNTER — Inpatient Hospital Stay: Payer: Medicare HMO

## 2020-04-07 VITALS — BP 112/82 | HR 108 | Temp 98.0°F | Resp 18

## 2020-04-07 DIAGNOSIS — D500 Iron deficiency anemia secondary to blood loss (chronic): Secondary | ICD-10-CM

## 2020-04-07 DIAGNOSIS — Z7984 Long term (current) use of oral hypoglycemic drugs: Secondary | ICD-10-CM | POA: Diagnosis not present

## 2020-04-07 DIAGNOSIS — E785 Hyperlipidemia, unspecified: Secondary | ICD-10-CM | POA: Diagnosis not present

## 2020-04-07 DIAGNOSIS — E538 Deficiency of other specified B group vitamins: Secondary | ICD-10-CM | POA: Diagnosis not present

## 2020-04-07 DIAGNOSIS — Z79899 Other long term (current) drug therapy: Secondary | ICD-10-CM | POA: Diagnosis not present

## 2020-04-07 DIAGNOSIS — J449 Chronic obstructive pulmonary disease, unspecified: Secondary | ICD-10-CM | POA: Diagnosis not present

## 2020-04-07 DIAGNOSIS — D509 Iron deficiency anemia, unspecified: Secondary | ICD-10-CM | POA: Diagnosis not present

## 2020-04-07 DIAGNOSIS — I471 Supraventricular tachycardia: Secondary | ICD-10-CM | POA: Diagnosis not present

## 2020-04-07 DIAGNOSIS — E119 Type 2 diabetes mellitus without complications: Secondary | ICD-10-CM | POA: Diagnosis not present

## 2020-04-07 DIAGNOSIS — D75839 Thrombocytosis, unspecified: Secondary | ICD-10-CM | POA: Diagnosis not present

## 2020-04-07 MED ORDER — IRON SUCROSE 20 MG/ML IV SOLN
200.0000 mg | Freq: Once | INTRAVENOUS | Status: AC
  Administered 2020-04-07: 200 mg via INTRAVENOUS
  Filled 2020-04-07: qty 10

## 2020-04-07 MED ORDER — SODIUM CHLORIDE 0.9 % IV SOLN: 200.0000 mg | Freq: Once | INTRAVENOUS | Status: DC

## 2020-04-07 MED ORDER — SODIUM CHLORIDE 0.9 % IV SOLN
Freq: Once | INTRAVENOUS | Status: AC
  Filled 2020-04-07: qty 250

## 2020-04-07 NOTE — Progress Notes (Signed)
Initial HR between 110-114. Rulon Abide, NP and Beckey Rutter, NP made aware.   Per Beckey Rutter, NP, okay to proceed with treatment.   Patient tolerated treatment well. Discharged home.

## 2020-04-12 ENCOUNTER — Inpatient Hospital Stay: Payer: Medicare HMO | Attending: Internal Medicine

## 2020-04-12 VITALS — BP 108/68 | HR 93 | Temp 97.8°F | Resp 20

## 2020-04-12 DIAGNOSIS — D75839 Thrombocytosis, unspecified: Secondary | ICD-10-CM | POA: Diagnosis not present

## 2020-04-12 DIAGNOSIS — D500 Iron deficiency anemia secondary to blood loss (chronic): Secondary | ICD-10-CM

## 2020-04-12 DIAGNOSIS — D509 Iron deficiency anemia, unspecified: Secondary | ICD-10-CM | POA: Insufficient documentation

## 2020-04-12 MED ORDER — SODIUM CHLORIDE 0.9 % IV SOLN
Freq: Once | INTRAVENOUS | Status: AC
  Filled 2020-04-12: qty 250

## 2020-04-12 MED ORDER — SODIUM CHLORIDE 0.9 % IV SOLN: 200.0000 mg | Freq: Once | INTRAVENOUS | Status: DC

## 2020-04-12 MED ORDER — IRON SUCROSE 20 MG/ML IV SOLN
200.0000 mg | Freq: Once | INTRAVENOUS | Status: AC
  Administered 2020-04-12: 200 mg via INTRAVENOUS
  Filled 2020-04-12: qty 10

## 2020-04-12 NOTE — Progress Notes (Signed)
1420- Patient tolerated Venofer infusion well. Patient and vital signs stable. Patient discharged to home at this time.

## 2020-04-14 ENCOUNTER — Inpatient Hospital Stay: Payer: Medicare HMO

## 2020-04-14 VITALS — BP 121/78 | HR 102 | Temp 98.5°F | Resp 16

## 2020-04-14 DIAGNOSIS — D75839 Thrombocytosis, unspecified: Secondary | ICD-10-CM | POA: Diagnosis not present

## 2020-04-14 DIAGNOSIS — D509 Iron deficiency anemia, unspecified: Secondary | ICD-10-CM | POA: Diagnosis not present

## 2020-04-14 DIAGNOSIS — D500 Iron deficiency anemia secondary to blood loss (chronic): Secondary | ICD-10-CM

## 2020-04-14 MED ORDER — SODIUM CHLORIDE 0.9 % IV SOLN: 200.0000 mg | Freq: Once | INTRAVENOUS | Status: DC

## 2020-04-14 MED ORDER — SODIUM CHLORIDE 0.9 % IV SOLN
Freq: Once | INTRAVENOUS | Status: AC
  Filled 2020-04-14: qty 250

## 2020-04-14 MED ORDER — IRON SUCROSE 20 MG/ML IV SOLN
200.0000 mg | Freq: Once | INTRAVENOUS | Status: AC
  Administered 2020-04-14: 200 mg via INTRAVENOUS
  Filled 2020-04-14: qty 10

## 2020-04-14 NOTE — Progress Notes (Signed)
Patient tolerated infusion well. VS WNL for patient. Discharged home.

## 2020-04-16 ENCOUNTER — Encounter: Payer: Self-pay | Admitting: Adult Health

## 2020-04-17 ENCOUNTER — Ambulatory Visit
Admission: EM | Admit: 2020-04-17 | Discharge: 2020-04-17 | Disposition: A | Discharge status: Home / Self Care | Payer: Medicare HMO

## 2020-04-17 ENCOUNTER — Telehealth: Payer: Self-pay

## 2020-04-17 MED ORDER — METOPROLOL SUCCINATE ER 25 MG PO TB24: 12.5000 mg | ORAL_TABLET | Freq: Every day | ORAL | 5 refills | Status: DC

## 2020-04-17 NOTE — Telephone Encounter (Signed)
Called patient and relayed Dr. Thereasa Solo below copied and pasted recommendations.  Scheduled patient to be seen tomorrow morning with Dr. Garen Lah. Patient will decrease his Toprol XL to 12.5 MG.   [name], MD  You; Emily Filbert, RN 18 minutes ago (12:39 PM)   Will have to be seen in the clinic. APP okay. Have patient check blood pressure, decrease Toprol-XL to 12.5 mg daily, place cardiac monitor x2 weeks to evaluate any arrhythmias.  Thanks  BA

## 2020-04-18 ENCOUNTER — Other Ambulatory Visit: Payer: Self-pay

## 2020-04-18 ENCOUNTER — Encounter: Payer: Self-pay | Admitting: Cardiology

## 2020-04-18 ENCOUNTER — Ambulatory Visit (INDEPENDENT_AMBULATORY_CARE_PROVIDER_SITE_OTHER): Payer: Medicare HMO | Admitting: Cardiology

## 2020-04-18 ENCOUNTER — Ambulatory Visit (INDEPENDENT_AMBULATORY_CARE_PROVIDER_SITE_OTHER): Payer: Medicare HMO

## 2020-04-18 VITALS — BP 132/82 | HR 104 | Ht 64.0 in | Wt 186.0 lb

## 2020-04-18 DIAGNOSIS — R55 Syncope and collapse: Secondary | ICD-10-CM | POA: Diagnosis not present

## 2020-04-18 DIAGNOSIS — R000 Tachycardia, unspecified: Secondary | ICD-10-CM | POA: Diagnosis not present

## 2020-04-18 DIAGNOSIS — I728 Aneurysm of other specified arteries: Secondary | ICD-10-CM

## 2020-04-18 DIAGNOSIS — E7800 Pure hypercholesterolemia, unspecified: Secondary | ICD-10-CM

## 2020-04-18 NOTE — Progress Notes (Signed)
Cardiology Office Note:    Date:  04/18/2020   ID:  Benjamin Conley, DOB 1965-12-29, MRN [id number]  PCP:  [name], NP  [facility] Cardiologist:  [name], MD  [place] Electrophysiologist:  None   Referring MD: [name], NP   Chief Complaint  Patient presents with  . Follow-up    Syncopal episodes    History of Present Illness:    Benjamin Conley is a 55 y.o. male with a hx of diabetes, splenic infarct s/p stent placement, COPD, hyperlipidemia, former smoker x40+ years who presents due to syncopal episodes.    He had 2 episodes of passing out with first episode occurring 2 days ago while he was about to have a bowel movement.  He states feeling dizzy while sitting on his toilet and then fell.  He woke up upon impact, denies loss of consciousness, or incontinence.  Similar episode happened yesterday when he was standing up about to urinate, also felt dizzy prior to falling.  Denies any chest pain, shortness of breath, palpitations with either episode.  Previously seen due to dyspnea on exertion, work-up with echo and Lexiscan Myoview were unrevealing.  Symptoms were attributed to COPD.  Started on Toprol-XL 25 mg daily due to elevated heart rates up to 120 bpm.   Prior notes Echo 05/5359 normal systolic and diastolic function, EF 55 to 60%. Lexiscan Myoview 08/2019 low risk, no evidence for ischemia, no significant coronary artery calcification  Patient had worsening left upper quadrant abdominal pain.  He presented to the ED  where CT abdomen dated 07/2019 showed distal splenic aneurysm no greater than 6 mm, small dissection, likely source of splenic infarctions.  He was diagnosed with splenic infarct and had a stent placed to the splenic artery.    Denies palpitations or irregular heartbeats.  States his heart rates have been elevated over the past 2 to 3 weeks, up to 120 bpm upon checks at home.   Past Medical History:  Diagnosis Date  .  Acute posthemorrhagic anemia   . Complication of anesthesia    c/o difficulty breathing after anesthesia  . COPD (chronic obstructive pulmonary disease) ([place])   . Diabetes mellitus without complication ([place])   . Hyperlipemia   . Melena 08/21/2019  . Paroxysmal supraventricular tachycardia ([place]) 08/19/2019  . Postural dizziness with presyncope 08/21/2019  . 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 Tobacco abuse   . Upper GI bleed 08/21/2019    Past Surgical History:  Procedure Laterality Date  . BACK SURGERY     lumbar  . COLONOSCOPY WITH PROPOFOL N/A 08/24/2019   Procedure: COLONOSCOPY WITH PROPOFOL;  Surgeon: [name], MD;  Location[facility] ENDOSCOPY;  Service: Endoscopy;  Laterality: N/A;  . ESOPHAGOGASTRODUODENOSCOPY (EGD) WITH PROPOFOL N/A 08/24/2019   Procedure: ESOPHAGOGASTRODUODENOSCOPY (EGD) WITH PROPOFOL;  Surgeon: [name], MD;  Location: [facility] ENDOSCOPY;  Service: Endoscopy;  Laterality: N/A;  . STENT PLACEMENT VASCULAR ([place] HX)  07/2019   stenosis of distal splenic artery and stent placed  . VISCERAL ANGIOGRAPHY N/A 08/06/2019   Procedure: VISCERAL ANGIOGRAPHY;  Surgeon: [name], MD;  Location: [facility] CV LAB;  Service: Cardiovascular;  Laterality: N/A;    Current Medications: Current Meds  Medication Sig  . albuterol (VENTOLIN HFA) 108 (90 Base) MCG/ACT inhaler Inhale 2 puffs into the lungs every 6 (six) hours as needed for wheezing or shortness of breath.  [name] aspirin EC 81 MG EC tablet Take 1 tablet (81 mg total) by mouth  daily.  . atorvastatin (LIPITOR) 40 MG tablet Take 1 tablet (40 mg total) by mouth daily.  . blood glucose meter kit and supplies Dispense based on patient and insurance preference. Use up to four times daily as directed. (FOR ICD-10 E10.9, E11.9).  [name] clopidogrel (PLAVIX) 75 MG tablet Take 1 tablet (75 mg total) by mouth daily.  [name] glucose blood test strip 1 each by Other route in the morning and at bedtime. Use to test blood sugar twice daily.   . insulin glargine (LANTUS) 100 UNIT/ML Solostar Pen Inject 20 Units into the skin at bedtime.  . Iron-Vitamin C (VITRON-C) 65-125 MG TABS Take 1 tablet by mouth daily.  . metFORMIN (GLUCOPHAGE-XR) 500 MG 24 hr tablet Take 250 mg by mouth 2 (two) times daily.  . metoprolol succinate (TOPROL XL) 25 MG 24 hr tablet Take 0.5 tablets (12.5 mg total) by mouth daily.  . nicotine (NICODERM CQ - DOSED IN MG/24 HOURS) 21 mg/24hr patch Place 1 patch (21 mg total) onto the skin daily.  . pantoprazole (PROTONIX) 40 MG tablet Take 1 tablet (40 mg total) by mouth daily.  . Semaglutide,0.25 or 0.5MG/DOS, (OZEMPIC, 0.25 OR 0.5 MG/DOSE,) 2 MG/1.5ML SOPN Inject 0.5 mg into the skin once a week.  . sildenafil (VIAGRA) 100 MG tablet Take 1 tablet (100 mg total) by mouth daily as needed for erectile dysfunction.  . tamsulosin (FLOMAX) 0.4 MG CAPS capsule Take 1 capsule (0.4 mg total) by mouth daily.  . Tiotropium Bromide-Olodaterol (STIOLTO RESPIMAT) 2.5-2.5 MCG/ACT AERS Inhale 2 puffs into the lungs daily.  . varenicline (CHANTIX) 1 MG tablet Take 1 tablet (1 mg total) by mouth 2 (two) times daily.     Allergies:   Wasp venom, Wasp venom protein, Coffee flavor, and Onion   Social History   Socioeconomic History  . Marital status: Single    Spouse name: Not on file  . Number of children: Not on file  . Years of education: Not on file  . Highest education level: Not on file  Occupational History  . Not on file  Tobacco Use  . Smoking status: Former Smoker    Packs/day: 1.00    Years: 48.00    Pack years: 48.00    Types: Cigarettes    Quit date: 02/12/2020    Years since quitting: 0.1  . Smokeless tobacco: Never Used  Vaping Use  . Vaping Use: Never used  Substance and Sexual Activity  . Alcohol use: No    Comment: Never been a problem  . Drug use: No  . Sexual activity: Yes  Other Topics Concern  . Not on file  Social History Narrative   Single, lives alone, unemployed.   Social Determinants  of Health   Financial Resource Strain: Low Risk   . Difficulty of Paying Living Expenses: Not hard at all  Food Insecurity: No Food Insecurity  . Worried About Charity fundraiser in the Last Year: Never true  . Ran Out of Food in the Last Year: Never true  Transportation Needs: No Transportation Needs  . Lack of Transportation (Medical): No  . Lack of Transportation (Non-Medical): No  Physical Activity: Not on file  Stress: Stress Concern Present  . Feeling of Stress : Very much  Social Connections: Not on file     Family History: The patient's family history includes Colon cancer in his maternal aunt and maternal uncle; Diabetes Mellitus II in his brother and mother; Hyperlipidemia in his brother.  ROS:  Please see the history of present illness.     All other systems reviewed and are negative.  EKGs/Labs/Other Studies Reviewed:    The following studies were reviewed today:   EKG:  EKG is  ordered today.  The ekg ordered today demonstrates sinus tachycardia, heart rate 104  Recent Labs: 08/07/2019: Magnesium 2.1 08/20/2019: TSH 1.68 09/23/2019: B Natriuretic Peptide 35.9 01/21/2020: ALT 14; BUN 9; Creat 0.66; Potassium 4.0; Sodium 138 03/29/2020: Hemoglobin 11.8; Platelets 503  Recent Lipid Panel    Component Value Date/Time   CHOL 94 01/21/2020 1505   TRIG 98 01/21/2020 1505   HDL 36 (L) 01/21/2020 1505   CHOLHDL 2.6 01/21/2020 1505   VLDL 28.4 10/21/2019 1432   LDLCALC 40 01/21/2020 1505    Physical Exam:    VS:  BP 132/82   Pulse (!) 104   Ht 5' 4" (1.626 m)   Wt 186 lb (84.4 kg)   BMI 31.93 kg/m     Wt Readings from Last 3 Encounters:  04/18/20 186 lb (84.4 kg)  03/31/20 188 lb (85.3 kg)  03/30/20 185 lb (83.9 kg)     GEN:  Well nourished, well developed in no acute distress HEENT: Normal NECK: No JVD; No carotid bruits LYMPHATICS: No lymphadenopathy CARDIAC: Tachycardic, regular, no murmurs, rubs, gallops RESPIRATORY:  Clear to auscultation  without rales, wheezing or rhonchi  ABDOMEN: Soft, non-tender, non-distended MUSCULOSKELETAL:  No edema; No deformity  SKIN: Warm and dry NEUROLOGIC:  Alert and oriented x 3 PSYCHIATRIC:  Normal affect   ASSESSMENT:    1. Syncope and collapse   2. Inappropriate sinus tachycardia   3. Pure hypercholesterolemia   4. Splenic artery aneurysm (HCC)    PLAN:    In order of problems listed above:  1. Patient with syncopal episodes.  Symptoms appear vasovagal in etiology.  Patient had no cardiac symptoms.  Orthostatic vitals obtained today with no evidence of orthostasis.  Echo 09/2019 was normal with EF 55 to 60%.  Place cardiac monitor x2 weeks.  Reduce Toprol-XL to 12.5 mg daily.  Adequate hydration advised. 2. Previously elevated heart rates, inappropriate sinus tach, now with controlled heart rates.  Decrease Toprol-XL to 12.5 mg daily due to syncopal episodes. 3. hyperlipidemia continue Lipitor as prescribed. 4. Splenic artery aneurysm, dissection status post stent placement.  On aspirin, Plavix, Lipitor.  Management/follow-up as per vascular surgery..  Follow-up in 6 weeks after cardiac monitor.  Medication Adjustments/Labs and Tests Ordered: Current medicines are reviewed at length with the patient today.  Concerns regarding medicines are outlined above.  Orders Placed This Encounter  Procedures  . LONG TERM MONITOR (3-14 DAYS)  . EKG 12-Lead   No orders of the defined types were placed in this encounter.   Patient Instructions  Medication Instructions:   Keep your metoprolol succinate (TOPROL XL)  At 12.5 MG once a day.   *If you need a refill on your cardiac medications before your next appointment, please call your pharmacy*   Lab Work: None ordered If you have labs (blood work) drawn today and your tests are completely normal, you will receive your results only by: [name] MyChart Message (if you have MyChart) OR . A paper copy in the mail If you have any lab test that is  abnormal or we need to change your treatment, we will call you to review the results.   Testing/Procedures:  Your physician has recommended that you wear a Zio monitor for 2 weeks. This monitor is a medical  device that records the heart's electrical activity. Doctors most often use these monitors to diagnose arrhythmias. Arrhythmias are problems with the speed or rhythm of the heartbeat. The monitor is a small device applied to your chest. You can wear one while you do your normal daily activities. While wearing this monitor if you have any symptoms to push the button and record what you felt. Once you have worn this monitor for the period of time provider prescribed (Usually 14 days), you will return the monitor device in the postage paid box. Once it is returned they will download the data collected and provide [place] with a report which the provider will then review and we will call you with those results. Important tips:  1. Avoid showering during the first 24 hours of wearing the monitor. 2. Avoid excessive sweating to help maximize wear time. 3. Do not submerge the device, no hot tubs, and no swimming pools. 4. Keep any lotions or oils away from the patch. 5. After 24 hours you may shower with the patch on. Take brief showers with your back facing the shower head.  6. Do not remove patch once it has been placed because that will interrupt data and decrease adhesive wear time. 7. Push the button when you have any symptoms and write down what you were feeling. 8. Once you have completed wearing your monitor, remove and place into box which has postage paid and place in your outgoing mailbox.  9. If for some reason you have misplaced your box then call our office and we can provide another box and/or mail it off for you.         Follow-Up: At [facility], you and your health needs are our priority.  As part of our continuing mission to provide you with exceptional heart care, we have  created designated Provider Care Teams.  These Care Teams include your primary Cardiologist (physician) and Advanced Practice Providers (APPs -  Physician Assistants and Nurse Practitioners) who all work together to provide you with the care you need, when you need it.  We recommend signing up for the patient portal called "MyChart".  Sign up information is provided on this After Visit Summary.  MyChart is used to connect with patients for Virtual Visits (Telemedicine).  Patients are able to view lab/test results, encounter notes, upcoming appointments, etc.  Non-urgent messages can be sent to your provider as well.   To learn more about what you can do with MyChart, go to [email or web address].    Your next appointment:   5-6 week(s)  The format for your next appointment:   In Person  Provider:   [name], MD   Other Instructions      Signed, [name], MD  04/18/2020 9:51 AM    [place]

## 2020-04-18 NOTE — Patient Instructions (Signed)
Medication Instructions:   Keep your metoprolol succinate (TOPROL XL)  At 12.5 MG once a day.   *If you need a refill on your cardiac medications before your next appointment, please call your pharmacy*   Lab Work: None ordered If you have labs (blood work) drawn today and your tests are completely normal, you will receive your results only by: [name] MyChart Message (if you have MyChart) OR . A paper copy in the mail If you have any lab test that is abnormal or we need to change your treatment, we will call you to review the results.   Testing/Procedures:  Your physician has recommended that you wear a Zio monitor for 2 weeks. This monitor is a medical device that records the heart's electrical activity. Doctors most often use these monitors to diagnose arrhythmias. Arrhythmias are problems with the speed or rhythm of the heartbeat. The monitor is a small device applied to your chest. You can wear one while you do your normal daily activities. While wearing this monitor if you have any symptoms to push the button and record what you felt. Once you have worn this monitor for the period of time provider prescribed (Usually 14 days), you will return the monitor device in the postage paid box. Once it is returned they will download the data collected and provide [place] with a report which the provider will then review and we will call you with those results. Important tips:  1. Avoid showering during the first 24 hours of wearing the monitor. 2. Avoid excessive sweating to help maximize wear time. 3. Do not submerge the device, no hot tubs, and no swimming pools. 4. Keep any lotions or oils away from the patch. 5. After 24 hours you may shower with the patch on. Take brief showers with your back facing the shower head.  6. Do not remove patch once it has been placed because that will interrupt data and decrease adhesive wear time. 7. Push the button when you have any symptoms and write down what you  were feeling. 8. Once you have completed wearing your monitor, remove and place into box which has postage paid and place in your outgoing mailbox.  9. If for some reason you have misplaced your box then call our office and we can provide another box and/or mail it off for you.         Follow-Up: At [facility], you and your health needs are our priority.  As part of our continuing mission to provide you with exceptional heart care, we have created designated Provider Care Teams.  These Care Teams include your primary Cardiologist (physician) and Advanced Practice Providers (APPs -  Physician Assistants and Nurse Practitioners) who all work together to provide you with the care you need, when you need it.  We recommend signing up for the patient portal called "MyChart".  Sign up information is provided on this After Visit Summary.  MyChart is used to connect with patients for Virtual Visits (Telemedicine).  Patients are able to view lab/test results, encounter notes, upcoming appointments, etc.  Non-urgent messages can be sent to your provider as well.   To learn more about what you can do with MyChart, go to [email or web address].    Your next appointment:   5-6 week(s)  The format for your next appointment:   In Person  Provider:   [name], MD   Other Instructions

## 2020-04-19 ENCOUNTER — Inpatient Hospital Stay: Payer: Medicare HMO

## 2020-04-19 VITALS — BP 120/85 | HR 118 | Temp 96.8°F | Resp 17

## 2020-04-19 DIAGNOSIS — D75839 Thrombocytosis, unspecified: Secondary | ICD-10-CM | POA: Diagnosis not present

## 2020-04-19 DIAGNOSIS — D500 Iron deficiency anemia secondary to blood loss (chronic): Secondary | ICD-10-CM

## 2020-04-19 DIAGNOSIS — D509 Iron deficiency anemia, unspecified: Secondary | ICD-10-CM | POA: Diagnosis not present

## 2020-04-19 MED ORDER — IRON SUCROSE 20 MG/ML IV SOLN
200.0000 mg | Freq: Once | INTRAVENOUS | Status: AC
  Administered 2020-04-19: 200 mg via INTRAVENOUS
  Filled 2020-04-19: qty 10

## 2020-04-19 MED ORDER — SODIUM CHLORIDE 0.9 % IV SOLN: 200.0000 mg | Freq: Once | INTRAVENOUS | Status: DC

## 2020-04-19 MED ORDER — SODIUM CHLORIDE 0.9 % IV SOLN
Freq: Once | INTRAVENOUS | Status: AC
  Filled 2020-04-19: qty 250

## 2020-04-19 NOTE — Progress Notes (Signed)
Pt tolerated infusion well. Pt and VS stable at discharge.  

## 2020-04-28 ENCOUNTER — Inpatient Hospital Stay: Payer: Medicare HMO

## 2020-04-28 ENCOUNTER — Other Ambulatory Visit: Payer: Self-pay

## 2020-04-28 DIAGNOSIS — D509 Iron deficiency anemia, unspecified: Secondary | ICD-10-CM | POA: Diagnosis not present

## 2020-04-28 DIAGNOSIS — D75839 Thrombocytosis, unspecified: Secondary | ICD-10-CM | POA: Diagnosis not present

## 2020-04-28 DIAGNOSIS — D500 Iron deficiency anemia secondary to blood loss (chronic): Secondary | ICD-10-CM

## 2020-04-28 MED ORDER — CYANOCOBALAMIN 1000 MCG/ML IJ SOLN
1000.0000 ug | Freq: Once | INTRAMUSCULAR | Status: AC
  Administered 2020-04-28: 1000 ug via INTRAMUSCULAR
  Filled 2020-04-28: qty 1

## 2020-05-01 DIAGNOSIS — R55 Syncope and collapse: Secondary | ICD-10-CM

## 2020-05-04 ENCOUNTER — Other Ambulatory Visit: Payer: Self-pay

## 2020-05-04 NOTE — Progress Notes (Signed)
Discontinued patients Toprol XL as requested by Dr. Garen Lah in patients MyChart response as seen below:  Benjamin Sable, MD  You 9 minutes ago (4:45 PM)   Please have patient stop Toprol-XL due to low blood pressures. Thank you   Message text

## 2020-05-08 DIAGNOSIS — R55 Syncope and collapse: Secondary | ICD-10-CM | POA: Diagnosis not present

## 2020-05-09 NOTE — Telephone Encounter (Signed)
Called and spoke to pt. Discussed recc by Laurann Montana, NP. Pt states that BP was taken when he woke up last night at 1AM. He did feel lightheaded, but denies shortness of breath and/or any other symptoms.  Confirms that he is no longer taking Metoprolol.  Verbalized that he will try to stay hydrated and will get either Gatorade Zero or Propel.  Will make position changes slowly and will start wearing his compression stockings.  He will hold off on Sildenafil at this time and will try Flomax every other day while BP is running low. Pt did agree to move appt from 3/18 to 3/11. Scheduled 3/11 @ 1140, pt aware to arrive at 1125.   Pt appreciative of call and has no further questions or needs at this time. Advised pt call our office with any change in symptoms in the interim.  Pt verbalized understanding.

## 2020-05-17 NOTE — Progress Notes (Deleted)
New patient visit   Patient: Benjamin Conley   DOB: March 21, 1965   55 y.o. Male  MRN: [id number] Visit Date: 05/18/2020  Today's healthcare provider: Marcille Buffy, [name]   No chief complaint on file.  Subjective    Benjamin Conley is a 55 y.o. male who presents today as a new patient to establish care.  HPI  ***  Past Medical History:  Diagnosis Date  . Acute posthemorrhagic anemia   . Complication of anesthesia    c/o difficulty breathing after anesthesia  . COPD (chronic obstructive pulmonary disease) ([place])   . Diabetes mellitus without complication ([place])   . Hyperlipemia   . Melena 08/21/2019  . Paroxysmal supraventricular tachycardia ([place]) 08/19/2019  . Postural dizziness with presyncope 08/21/2019  . 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 Tobacco abuse   . Upper GI bleed 08/21/2019   Past Surgical History:  Procedure Laterality Date  . BACK SURGERY     lumbar  . COLONOSCOPY WITH PROPOFOL N/A 08/24/2019   Procedure: COLONOSCOPY WITH PROPOFOL;  Surgeon: [name], MD;  Location: [facility] ENDOSCOPY;  Service: Endoscopy;  Laterality: N/A;  . ESOPHAGOGASTRODUODENOSCOPY (EGD) WITH PROPOFOL N/A 08/24/2019   Procedure: ESOPHAGOGASTRODUODENOSCOPY (EGD) WITH PROPOFOL;  Surgeon: [name], MD;  Location: [facility] ENDOSCOPY;  Service: Endoscopy;  Laterality: N/A;  . STENT PLACEMENT VASCULAR ([place] HX)  07/2019   stenosis of distal splenic artery and stent placed  . VISCERAL ANGIOGRAPHY N/A 08/06/2019   Procedure: VISCERAL ANGIOGRAPHY;  Surgeon: [name], MD;  Location: [place] CV LAB;  Service: Cardiovascular;  Laterality: N/A;   Family Status  Relation Name Status  . Mother  Deceased  . Brother  Alive  . Father  Deceased  . Mat Aunt  (Not Specified)  . Mat Uncle  (Not Specified)   Family History  Problem Relation Age of Onset  . Diabetes Mellitus II Mother   . Diabetes Mellitus II Brother   . Hyperlipidemia Brother   . Colon cancer Maternal Aunt   .  Colon cancer Maternal Uncle    Social History   Socioeconomic History  . Marital status: Single    Spouse name: Not on file  . Number of children: Not on file  . Years of education: Not on file  . Highest education level: Not on file  Occupational History  . Not on file  Tobacco Use  . Smoking status: Former Smoker    Packs/day: 1.00    Years: 48.00    Pack years: 48.00    Types: Cigarettes    Quit date: 02/12/2020    Years since quitting: 0.2  . Smokeless tobacco: Never Used  Vaping Use  . Vaping Use: Never used  Substance and Sexual Activity  . Alcohol use: No    Comment: Never been a problem  . Drug use: No  . Sexual activity: Yes  Other Topics Concern  . Not on file  Social History Narrative   Single, lives alone, unemployed.   Social Determinants of Health   Financial Resource Strain: Low Risk   . Difficulty of Paying Living Expenses: Not hard at all  Food Insecurity: No Food Insecurity  . Worried About Charity fundraiser in the Last Year: Never true  . Ran Out of Food in the Last Year: Never true  Transportation Needs: No Transportation Needs  . Lack of Transportation (Medical): No  . Lack of Transportation (Non-Medical): No  Physical Activity: Not on file  Stress: Stress Concern Present  .  Feeling of Stress : Very much  Social Connections: Not on file   Outpatient Medications Prior to Visit  Medication Sig  . albuterol (VENTOLIN HFA) 108 (90 Base) MCG/ACT inhaler Inhale 2 puffs into the lungs every 6 (six) hours as needed for wheezing or shortness of breath.  [name] aspirin EC 81 MG EC tablet Take 1 tablet (81 mg total) by mouth daily.  [name] atorvastatin (LIPITOR) 40 MG tablet Take 1 tablet (40 mg total) by mouth daily.  . blood glucose meter kit and supplies Dispense based on patient and insurance preference. Use up to four times daily as directed. (FOR ICD-10 E10.9, E11.9).  [name] clopidogrel (PLAVIX) 75 MG tablet Take 1 tablet (75 mg total) by mouth daily.  [name]  glucose blood test strip 1 each by Other route in the morning and at bedtime. Use to test blood sugar twice daily.  . insulin glargine (LANTUS) 100 UNIT/ML Solostar Pen Inject 20 Units into the skin at bedtime.  . Iron-Vitamin C (VITRON-C) 65-125 MG TABS Take 1 tablet by mouth daily.  . metFORMIN (GLUCOPHAGE-XR) 500 MG 24 hr tablet Take 250 mg by mouth 2 (two) times daily.  . nicotine (NICODERM CQ - DOSED IN MG/24 HOURS) 21 mg/24hr patch Place 1 patch (21 mg total) onto the skin daily.  . pantoprazole (PROTONIX) 40 MG tablet Take 1 tablet (40 mg total) by mouth daily.  . Semaglutide,0.25 or 0.5MG/DOS, (OZEMPIC, 0.25 OR 0.5 MG/DOSE,) 2 MG/1.5ML SOPN Inject 0.5 mg into the skin once a week.  . sildenafil (VIAGRA) 100 MG tablet Take 1 tablet (100 mg total) by mouth daily as needed for erectile dysfunction.  . tamsulosin (FLOMAX) 0.4 MG CAPS capsule Take 1 capsule (0.4 mg total) by mouth daily.  . Tiotropium Bromide-Olodaterol (STIOLTO RESPIMAT) 2.5-2.5 MCG/ACT AERS Inhale 2 puffs into the lungs daily.  . varenicline (CHANTIX) 1 MG tablet Take 1 tablet (1 mg total) by mouth 2 (two) times daily.   No facility-administered medications prior to visit.   Allergies  Allergen Reactions  . Wasp Venom Anaphylaxis  . Wasp Venom Protein Anaphylaxis  . Coffee Flavor Nausea And Vomiting    Patient states coffee gives him nausea and stomach cramps  . Onion     Immunization History  Administered Date(s) Administered  . Td 03/11/2004  . Tdap 08/02/2014    Health Maintenance  Topic Date Due  . FOOT EXAM  Never done  . INFLUENZA VACCINE  06/08/2020 (Originally 10/10/2019)  . PNEUMOCOCCAL POLYSACCHARIDE VACCINE AGE 79-64 HIGH RISK  10/18/2020 (Originally 10/21/1967)  . Hepatitis C Screening  10/18/2020 (Originally 13-Aug-1965)  . HEMOGLOBIN A1C  07/20/2020  . URINE MICROALBUMIN  01/20/2021  . OPHTHALMOLOGY EXAM  02/20/2021  . TETANUS/TDAP  08/01/2024  . COLONOSCOPY (Pts 45-52yr Insurance coverage will  need to be confirmed)  08/23/2029  . HIV Screening  Completed  . HPV VACCINES  Aged Out  . COVID-19 Vaccine  Discontinued    Patient Care Team: MMarval Regal NP as PCP - General (Nurse Practitioner) AKate Sable MD as PCP - Cardiology (Cardiology) FLloyd Huger MD as Consulting Physician (Oncology)  Review of Systems  All other systems reviewed and are negative.   {Labs  Heme  Chem  Endocrine  Serology  Results Review (optional):[id number]::" "}  Objective    There were no vitals taken for this visit. Physical Exam ***  Depression Screen PHQ 2/9 Scores 10/19/2019 08/19/2019 08/11/2019  PHQ - 2 Score 0 0 1  PHQ- 9 Score -  3 9   No results found for any visits on 05/18/20.  Assessment & Plan     ***  No follow-ups on file.     {provider attestation***:1}   Marcille Buffy, [place] [phone] (phone) [phone] (fax)  [place]

## 2020-05-18 ENCOUNTER — Ambulatory Visit: Payer: Medicare HMO | Admitting: Adult Health

## 2020-05-19 ENCOUNTER — Encounter: Payer: Self-pay | Admitting: Cardiology

## 2020-05-19 ENCOUNTER — Other Ambulatory Visit: Payer: Self-pay

## 2020-05-19 ENCOUNTER — Ambulatory Visit (INDEPENDENT_AMBULATORY_CARE_PROVIDER_SITE_OTHER): Payer: Medicare HMO | Admitting: Cardiology

## 2020-05-19 VITALS — BP 140/86 | HR 94 | Ht 64.0 in | Wt 183.0 lb

## 2020-05-19 DIAGNOSIS — R55 Syncope and collapse: Secondary | ICD-10-CM

## 2020-05-19 DIAGNOSIS — I728 Aneurysm of other specified arteries: Secondary | ICD-10-CM | POA: Diagnosis not present

## 2020-05-19 DIAGNOSIS — E7800 Pure hypercholesterolemia, unspecified: Secondary | ICD-10-CM

## 2020-05-19 NOTE — Patient Instructions (Signed)

## 2020-05-19 NOTE — Progress Notes (Signed)
Cardiology Office Note:    Date:  05/19/2020   ID:  Benjamin Conley, DOB 1966/03/08, MRN [id number]  PCP:  [name], NP  [facility] Cardiologist:  [name], MD  [place] Electrophysiologist:  None   Referring MD: [name], NP   Chief Complaint  Patient presents with  . Other    5-6 month follow up - patient c.o dizziness. Meds reviewed verbally with patient.     History of Present Illness:    Benjamin Conley is a 55 y.o. male with a hx of diabetes, splenic infarct s/p stent placement, COPD, hyperlipidemia, former smoker x40+ years who presents for follow-up.  He was last seen due to syncopal episodes.  Symptoms were deemed likely vasovagal in etiology as syncope occured in the context of bowel movement and urinating.  Previous echo with no structural abnormalities, EF normal.  Cardiac monitor was placed to evaluate any significant arrhythmias.    Patient did mention having low blood pressures with systolic in the 30Q.  Toprol-XL was stopped with improvement in his blood pressure.  Has dizziness associated with bending over and also standing from sitting position.  Denies palpitations.  Denies any further episodes of syncope  Prior notes Echo 09/6224 normal systolic and diastolic function, EF 55 to 60%. Lexiscan Myoview 08/2019 low risk, no evidence for ischemia, no significant coronary artery calcification  Patient had worsening left upper quadrant abdominal pain.  He presented to the ED  where CT abdomen dated 07/2019 showed distal splenic aneurysm no greater than 6 mm, small dissection, likely source of splenic infarctions.  He was diagnosed with splenic infarct and had a stent placed to the splenic artery.    Denies palpitations or irregular heartbeats.  States his heart rates have been elevated over the past 2 to 3 weeks, up to 120 bpm upon checks at home.   Past Medical History:  Diagnosis Date  . Acute posthemorrhagic anemia   .  Complication of anesthesia    c/o difficulty breathing after anesthesia  . COPD (chronic obstructive pulmonary disease) ([place])   . Diabetes mellitus without complication ([place])   . Hyperlipemia   . Melena 08/21/2019  . Paroxysmal supraventricular tachycardia ([place]) 08/19/2019  . Postural dizziness with presyncope 08/21/2019  . 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 Tobacco abuse   . Upper GI bleed 08/21/2019    Past Surgical History:  Procedure Laterality Date  . BACK SURGERY     lumbar  . COLONOSCOPY WITH PROPOFOL N/A 08/24/2019   Procedure: COLONOSCOPY WITH PROPOFOL;  Surgeon: [name], MD;  Location: [facility] ENDOSCOPY;  Service: Endoscopy;  Laterality: N/A;  . ESOPHAGOGASTRODUODENOSCOPY (EGD) WITH PROPOFOL N/A 08/24/2019   Procedure: ESOPHAGOGASTRODUODENOSCOPY (EGD) WITH PROPOFOL;  Surgeon: [name], MD;  Location: [facility] ENDOSCOPY;  Service: Endoscopy;  Laterality: N/A;  . STENT PLACEMENT VASCULAR ([place] [facility]X)  07/2019   stenosis of distal splenic artery and stent placed  . VISCERAL ANGIOGRAPHY N/A 08/06/2019   Procedure: VISCERAL ANGIOGRAPHY;  Surgeon: [name], MD;  Location: [place] CV LAB;  Service: Cardiovascular;  Laterality: N/A;    Current Medications: Current Meds  Medication Sig  . albuterol (VENTOLIN HFA) 108 (90 Base) MCG/ACT inhaler Inhale 2 puffs into the lungs every 6 (six) hours as needed for wheezing or shortness of breath.  [name] aspirin EC 81 MG EC tablet Take 1 tablet (81 mg total) by mouth daily.  [name] atorvastatin (LIPITOR) 40 MG tablet Take 1 tablet (40 mg total) by mouth  daily.  . blood glucose meter kit and supplies Dispense based on patient and insurance preference. Use up to four times daily as directed. (FOR ICD-10 E10.9, E11.9).  [name] clopidogrel (PLAVIX) 75 MG tablet Take 1 tablet (75 mg total) by mouth daily.  [name] glucose blood test strip 1 each by Other route in the morning and at bedtime. Use to test blood sugar twice daily.  . insulin glargine (LANTUS) 100  UNIT/ML Solostar Pen Inject 20 Units into the skin at bedtime.  . Iron-Vitamin C (VITRON-C) 65-125 MG TABS Take 1 tablet by mouth daily.  . metFORMIN (GLUCOPHAGE-XR) 500 MG 24 hr tablet Take 250 mg by mouth 2 (two) times daily.  . nicotine (NICODERM CQ - DOSED IN MG/24 HOURS) 21 mg/24hr patch Place 1 patch (21 mg total) onto the skin daily.  . pantoprazole (PROTONIX) 40 MG tablet Take 1 tablet (40 mg total) by mouth daily.  . Semaglutide,0.25 or 0.5MG/DOS, (OZEMPIC, 0.25 OR 0.5 MG/DOSE,) 2 MG/1.5ML SOPN Inject 0.5 mg into the skin once a week.  . sildenafil (VIAGRA) 100 MG tablet Take 1 tablet (100 mg total) by mouth daily as needed for erectile dysfunction.  . tamsulosin (FLOMAX) 0.4 MG CAPS capsule Take 1 capsule (0.4 mg total) by mouth daily.  . Tiotropium Bromide-Olodaterol (STIOLTO RESPIMAT) 2.5-2.5 MCG/ACT AERS Inhale 2 puffs into the lungs daily.  . varenicline (CHANTIX) 1 MG tablet Take 1 tablet (1 mg total) by mouth 2 (two) times daily.     Allergies:   Wasp venom, Wasp venom protein, Coffee flavor, and Onion   Social History   Socioeconomic History  . Marital status: Single    Spouse name: Not on file  . Number of children: Not on file  . Years of education: Not on file  . Highest education level: Not on file  Occupational History  . Not on file  Tobacco Use  . Smoking status: Former Smoker    Packs/day: 1.00    Years: 48.00    Pack years: 48.00    Types: Cigarettes    Quit date: 02/12/2020    Years since quitting: 0.2  . Smokeless tobacco: Never Used  Vaping Use  . Vaping Use: Never used  Substance and Sexual Activity  . Alcohol use: No    Comment: Never been a problem  . Drug use: No  . Sexual activity: Yes  Other Topics Concern  . Not on file  Social History Narrative   Single, lives alone, unemployed.   Social Determinants of Health   Financial Resource Strain: Low Risk   . Difficulty of Paying Living Expenses: Not hard at all  Food Insecurity: No Food  Insecurity  . Worried About Charity fundraiser in the Last Year: Never true  . Ran Out of Food in the Last Year: Never true  Transportation Needs: No Transportation Needs  . Lack of Transportation (Medical): No  . Lack of Transportation (Non-Medical): No  Physical Activity: Not on file  Stress: Stress Concern Present  . Feeling of Stress : Very much  Social Connections: Not on file     Family History: The patient's family history includes Colon cancer in his maternal aunt and maternal uncle; Diabetes Mellitus II in his brother and mother; Hyperlipidemia in his brother.  ROS:   Please see the history of present illness.     All other systems reviewed and are negative.  EKGs/Labs/Other Studies Reviewed:    The following studies were reviewed today:   EKG:  EKG is  ordered today.  The ekg ordered today demonstrates sinus tachycardia, heart rate 104  Recent Labs: 08/07/2019: Magnesium 2.1 08/20/2019: TSH 1.68 09/23/2019: B Natriuretic Peptide 35.9 01/21/2020: ALT 14; BUN 9; Creat 0.66; Potassium 4.0; Sodium 138 03/29/2020: Hemoglobin 11.8; Platelets 503  Recent Lipid Panel    Component Value Date/Time   CHOL 94 01/21/2020 1505   TRIG 98 01/21/2020 1505   HDL 36 (L) 01/21/2020 1505   CHOLHDL 2.6 01/21/2020 1505   VLDL 28.4 10/21/2019 1432   LDLCALC 40 01/21/2020 1505    Physical Exam:    VS:  BP 140/86 (BP Location: Left Arm, Patient Position: Sitting, Cuff Size: Normal)   Pulse 94   Ht 5' 4"  (1.626 m)   Wt 183 lb (83 kg)   SpO2 98%   BMI 31.41 kg/m     Wt Readings from Last 3 Encounters:  05/19/20 183 lb (83 kg)  04/18/20 186 lb (84.4 kg)  03/31/20 188 lb (85.3 kg)     GEN:  Well nourished, well developed in no acute distress HEENT: Normal NECK: No JVD; No carotid bruits LYMPHATICS: No lymphadenopathy CARDIAC: Tachycardic, regular, no murmurs, rubs, gallops RESPIRATORY:  Clear to auscultation without rales, wheezing or rhonchi  ABDOMEN: Soft, non-tender,  non-distended MUSCULOSKELETAL:  No edema; No deformity  SKIN: Warm and dry NEUROLOGIC:  Alert and oriented x 3 PSYCHIATRIC:  Normal affect   ASSESSMENT:    1. Syncope and collapse   2. Pure hypercholesterolemia   3. Splenic artery aneurysm (HCC)    PLAN:    In order of problems listed above:  1. Patient with syncopal episodes.  Likely vasovagal in etiology.  Dizziness noted with moving from sitting to standing position today, he may have vertigo.  Orthostatic vitals showed a trend towards orthostasis.  Baseline systolic in the 774J to 287O.  Cardiac monitor showed no significant arrhythmias to suggest etiology of syncope.  Conservative measures such as being careful with moving from sitting to standing and bending advised.   2. Hyperlipidemia, continue Lipitor 3. Splenic artery aneurysm, dissection s/p stent placement.  On aspirin, Plavix, Lipitor.  Keep follow-up appointments with vascular surgery.  Follow-up in 6 months  Medication Adjustments/Labs and Tests Ordered: Current medicines are reviewed at length with the patient today.  Concerns regarding medicines are outlined above.  Orders Placed This Encounter  Procedures  . EKG 12-Lead   No orders of the defined types were placed in this encounter.   Patient Instructions  Medication Instructions:  Your physician recommends that you continue on your current medications as directed. Please refer to the Current Medication list given to you today.  *If you need a refill on your cardiac medications before your next appointment, please call your pharmacy*   Lab Work: None ordered If you have labs (blood work) drawn today and your tests are completely normal, you will receive your results only by: [name] MyChart Message (if you have MyChart) OR . A paper copy in the mail If you have any lab test that is abnormal or we need to change your treatment, we will call you to review the results.   Testing/Procedures: None  ordered   Follow-Up: At [facility], you and your health needs are our priority.  As part of our continuing mission to provide you with exceptional heart care, we have created designated Provider Care Teams.  These Care Teams include your primary Cardiologist (physician) and Advanced Practice Providers (APPs -  Physician Assistants and Nurse Practitioners)  who all work together to provide you with the care you need, when you need it.  We recommend signing up for the patient portal called "MyChart".  Sign up information is provided on this After Visit Summary.  MyChart is used to connect with patients for Virtual Visits (Telemedicine).  Patients are able to view lab/test results, encounter notes, upcoming appointments, etc.  Non-urgent messages can be sent to your provider as well.   To learn more about what you can do with MyChart, go to [email or web address].    Your next appointment:   6 month(s)  The format for your next appointment:   In Person  Provider:   [name], MD   Other Instructions       Signed, [name], MD  05/19/2020 12:37 PM    [place]

## 2020-05-22 ENCOUNTER — Telehealth: Payer: Self-pay

## 2020-05-22 NOTE — Telephone Encounter (Signed)
Patient is aware of date/tiem of covid test prior to PFT.

## 2020-05-23 ENCOUNTER — Other Ambulatory Visit
Admission: RE | Admit: 2020-05-23 | Discharge: 2020-05-23 | Disposition: A | Discharge status: Home / Self Care | Payer: Medicare HMO | Source: Ambulatory Visit | Attending: Pulmonary Disease | Admitting: Pulmonary Disease

## 2020-05-23 ENCOUNTER — Other Ambulatory Visit: Payer: Self-pay

## 2020-05-23 DIAGNOSIS — Z01812 Encounter for preprocedural laboratory examination: Secondary | ICD-10-CM | POA: Insufficient documentation

## 2020-05-23 DIAGNOSIS — U071 COVID-19: Secondary | ICD-10-CM | POA: Diagnosis not present

## 2020-05-23 LAB — SARS CORONAVIRUS 2 (TAT 6-24 HRS): SARS Coronavirus 2: POSITIVE — AB

## 2020-05-24 ENCOUNTER — Ambulatory Visit: Payer: Medicare HMO | Attending: Pulmonary Disease

## 2020-05-24 ENCOUNTER — Telehealth: Payer: Self-pay

## 2020-05-24 NOTE — Telephone Encounter (Signed)
Called to discuss with patient about COVID-19 symptoms and the use of one of the available treatments for those with mild to moderate Covid symptoms and at a high risk of hospitalization.  Pt appears to qualify for outpatient treatment due to co-morbid conditions and/or a member of an at-risk group in accordance with the FDA Emergency Use Authorization.    Symptom onset: No Vaccinated: No Booster? No Immunocompromised? No Qualifiers: HTN,COPD,DM  Pt. States he has no symptoms.   Benjamin Conley

## 2020-05-26 ENCOUNTER — Inpatient Hospital Stay: Payer: Medicare HMO | Attending: Internal Medicine

## 2020-05-26 ENCOUNTER — Ambulatory Visit: Payer: Medicare HMO | Admitting: Cardiology

## 2020-06-16 ENCOUNTER — Encounter: Payer: Self-pay | Admitting: Adult Health

## 2020-06-16 ENCOUNTER — Ambulatory Visit (INDEPENDENT_AMBULATORY_CARE_PROVIDER_SITE_OTHER): Payer: Medicare HMO | Admitting: Adult Health

## 2020-06-16 ENCOUNTER — Other Ambulatory Visit: Payer: Self-pay

## 2020-06-16 VITALS — BP 159/79 | HR 91 | Ht 64.0 in | Wt 198.0 lb

## 2020-06-16 DIAGNOSIS — E119 Type 2 diabetes mellitus without complications: Secondary | ICD-10-CM | POA: Diagnosis not present

## 2020-06-16 DIAGNOSIS — L259 Unspecified contact dermatitis, unspecified cause: Secondary | ICD-10-CM | POA: Insufficient documentation

## 2020-06-16 DIAGNOSIS — Z1389 Encounter for screening for other disorder: Secondary | ICD-10-CM

## 2020-06-16 DIAGNOSIS — N401 Enlarged prostate with lower urinary tract symptoms: Secondary | ICD-10-CM | POA: Diagnosis not present

## 2020-06-16 DIAGNOSIS — E1165 Type 2 diabetes mellitus with hyperglycemia: Secondary | ICD-10-CM

## 2020-06-16 DIAGNOSIS — E1140 Type 2 diabetes mellitus with diabetic neuropathy, unspecified: Secondary | ICD-10-CM

## 2020-06-16 DIAGNOSIS — D75839 Thrombocytosis, unspecified: Secondary | ICD-10-CM

## 2020-06-16 DIAGNOSIS — E538 Deficiency of other specified B group vitamins: Secondary | ICD-10-CM

## 2020-06-16 DIAGNOSIS — I10 Essential (primary) hypertension: Secondary | ICD-10-CM

## 2020-06-16 DIAGNOSIS — R3911 Hesitancy of micturition: Secondary | ICD-10-CM

## 2020-06-16 DIAGNOSIS — IMO0002 Reserved for concepts with insufficient information to code with codable children: Secondary | ICD-10-CM

## 2020-06-16 MED ORDER — OZEMPIC (0.25 OR 0.5 MG/DOSE) 2 MG/1.5ML ~~LOC~~ SOPN: 0.5000 mg | PEN_INJECTOR | SUBCUTANEOUS | 1 refills | Status: DC

## 2020-06-16 MED ORDER — NYSTATIN-TRIAMCINOLONE 100000-0.1 UNIT/GM-% EX OINT: 1.0000 "application " | TOPICAL_OINTMENT | Freq: Two times a day (BID) | CUTANEOUS | 0 refills | Status: DC

## 2020-06-16 MED ORDER — INSULIN GLARGINE 100 UNIT/ML SOLOSTAR PEN: 30.0000 [IU] | PEN_INJECTOR | Freq: Every day | SUBCUTANEOUS | 3 refills | Status: DC

## 2020-06-16 MED ORDER — TAMSULOSIN HCL 0.4 MG PO CAPS: 0.4000 mg | ORAL_CAPSULE | Freq: Every day | ORAL | 0 refills | Status: DC

## 2020-06-16 MED ORDER — GLUCOSE BLOOD VI STRP: 1.0000 | ORAL_STRIP | Freq: Two times a day (BID) | 3 refills | Status: DC

## 2020-06-16 NOTE — Progress Notes (Signed)
New patient visit   Patient: Benjamin Conley   DOB: September 29, 1965   55 y.o. Male  MRN: [id number] Visit Date: 06/16/2020  Today's healthcare provider: Marcille Buffy, [name]   Chief Complaint  Patient presents with  . Establish Care   Subjective    Benjamin Conley is a 55 y.o. male who presents today as a new patient to establish care.  HPI  He has been feeling well. He stopped Metformin XR due to diarrhea.  Hyperlipidemia on  Liptor 61m.   He checks blood glucose  He is doing Lantus 30 units in the morning and 30 at night. He has not been taking Ozempic his script ran out and he had GI diarrhea Metformin. Average blood glucose is in the 200's on his phone list. Highest was 400 and lowest without eating was 112 without eating.   Patient  denies any fever, body aches,chills, rash, chest pain, shortness of breath, nausea, vomiting, or diarrhea.  Denies dizziness, lightheadedness, pre syncopal or syncopal episodes.    Past Medical History:  Diagnosis Date  . Acute posthemorrhagic anemia   . Complication of anesthesia    c/o difficulty breathing after anesthesia  . COPD (chronic obstructive pulmonary disease) ([place]   . Diabetes mellitus without complication ([place]   . Hyperlipemia   . Melena 08/21/2019  . Paroxysmal [place]ricular tachycardia ([place] 08/19/2019  . Postural dizziness with presyncope 08/21/2019  . 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 Tobacco abuse   . Upper GI bleed 08/21/2019   Past Surgical History:  Procedure Laterality Date  . BACK SURGERY     lumbar  . COLONOSCOPY WITH PROPOFOL N/A 08/24/2019   Procedure: COLONOSCOPY WITH PROPOFOL;  Surgeon: WLucilla Lame MD;  [facility];  Service: Endoscopy;  Laterality: N/A;  . ESOPHAGOGASTRODUODENOSCOPY (EGD) WITH PROPOFOL N/A 08/24/2019   Procedure: ESOPHAGOGASTRODUODENOSCOPY (EGD) WITH PROPOFOL;  Surgeon: WLucilla Lame MD;  [facility];  Service: Endoscopy;  Laterality: N/A;  . STENT  PLACEMENT VASCULAR ([place])  07/2019   stenosis of distal splenic artery and stent placed  . VISCERAL ANGIOGRAPHY N/A 08/06/2019   Procedure: VISCERAL ANGIOGRAPHY;  Surgeon: SKatha Cabal MD;  [facility];  Service: Cardiovascular;  Laterality: N/A;   Family Status  Relation Name Status  . Mother  Deceased  . Brother  Alive  . Father  Deceased  . Mat Aunt  Deceased  . Mat Uncle  Deceased  . Sister  Deceased  . Brother  Deceased       Brother past away in the [employer] . Sister  Alive  . Sister  Alive  . Sister  Alive  . Neg Hx  (Not Specified)   Family History  Problem Relation Age of Onset  . Diabetes Mellitus II Mother   . Diabetes Mellitus II Brother   . Hyperlipidemia Brother   . Tuberculosis Father   . Heart disease Father   . Cancer Maternal Aunt   . Colon cancer Maternal Uncle   . Diabetes Sister   . Diabetes Sister   . Diabetes Sister   . Diabetes Sister   . Prostate cancer Neg Hx    Social History   Socioeconomic History  . Marital status: Single    Spouse name: Not on file  . Number of children: Not on file  . Years of education: Not on file  . Highest education level: Not on file  Occupational History  . Not on file  Tobacco Use  .  Smoking status: Former Smoker    Packs/day: 1.00    Years: 48.00    Pack years: 48.00    Types: Cigarettes    Quit date: 02/12/2020    Years since quitting: 0.3  . Smokeless tobacco: Never Used  Vaping Use  . Vaping Use: Never used  Substance and Sexual Activity  . Alcohol use: No    Comment: Never been a problem  . Drug use: No  . Sexual activity: Yes  Other Topics Concern  . Not on file  Social History Narrative   Single, lives alone, unemployed.   Social Determinants of Health   Financial Resource Strain: Low Risk   . Difficulty of Paying Living Expenses: Not hard at all  Food Insecurity: No Food Insecurity  . Worried About Charity fundraiser in the Last Year: Never true  . Ran Out  of Food in the Last Year: Never true  Transportation Needs: No Transportation Needs  . Lack of Transportation (Medical): No  . Lack of Transportation (Non-Medical): No  Physical Activity: Not on file  Stress: Stress Concern Present  . Feeling of Stress : Very much  Social Connections: Not on file   Outpatient Medications Prior to Visit  Medication Sig  . albuterol (VENTOLIN HFA) 108 (90 Base) MCG/ACT inhaler Inhale 2 puffs into the lungs every 6 (six) hours as needed for wheezing or shortness of breath.  [name] aspirin EC 81 MG EC tablet Take 1 tablet (81 mg total) by mouth daily.  [name] atorvastatin (LIPITOR) 40 MG tablet Take 1 tablet (40 mg total) by mouth daily.  . blood glucose meter kit and supplies Dispense based on patient and insurance preference. Use up to four times daily as directed. (FOR ICD-10 E10.9, E11.9).  [name] clopidogrel (PLAVIX) 75 MG tablet Take 1 tablet (75 mg total) by mouth daily.  . [DISCONTINUED] glucose blood test strip 1 each by Other route in the morning and at bedtime. Use to test blood sugar twice daily.  . [DISCONTINUED] insulin glargine (LANTUS) 100 UNIT/ML Solostar Pen Inject 20 Units into the skin at bedtime. (Patient taking differently: Inject 30 Units into the skin 2 (two) times daily.)  . [DISCONTINUED] tamsulosin (FLOMAX) 0.4 MG CAPS capsule Take 1 capsule (0.4 mg total) by mouth daily.  . [DISCONTINUED] varenicline (CHANTIX) 1 MG tablet Take 1 tablet (1 mg total) by mouth 2 (two) times daily.  . Iron-Vitamin C (VITRON-C) 65-125 MG TABS Take 1 tablet by mouth daily. (Patient not taking: Reported on 06/16/2020)  . nicotine (NICODERM CQ - DOSED IN MG/24 HOURS) 21 mg/24hr patch Place 1 patch (21 mg total) onto the skin daily. (Patient not taking: Reported on 06/16/2020)  . sildenafil (VIAGRA) 100 MG tablet Take 1 tablet (100 mg total) by mouth daily as needed for erectile dysfunction. (Patient not taking: Reported on 06/16/2020)  . Tiotropium Bromide-Olodaterol (STIOLTO  RESPIMAT) 2.5-2.5 MCG/ACT AERS Inhale 2 puffs into the lungs daily. (Patient not taking: Reported on 06/16/2020)  . [DISCONTINUED] metFORMIN (GLUCOPHAGE-XR) 500 MG 24 hr tablet Take 250 mg by mouth 2 (two) times daily. (Patient not taking: Reported on 06/16/2020)  . [DISCONTINUED] pantoprazole (PROTONIX) 40 MG tablet Take 1 tablet (40 mg total) by mouth daily. (Patient not taking: Reported on 06/16/2020)  . [DISCONTINUED] Semaglutide,0.25 or 0.5MG/DOS, (OZEMPIC, 0.25 OR 0.5 MG/DOSE,) 2 MG/1.5ML SOPN Inject 0.5 mg into the skin once a week. (Patient not taking: Reported on 06/16/2020)   No facility-administered medications prior to visit.   Allergies  Allergen  Reactions  . Wasp Venom Anaphylaxis  . Wasp Venom Protein Anaphylaxis  . Coffee Flavor Nausea And Vomiting    Patient states coffee gives him nausea and stomach cramps  . Onion     Immunization History  Administered Date(s) Administered  . Td 03/11/2004  . Tdap 08/02/2014    Health Maintenance  Topic Date Due  . FOOT EXAM  Never done  . PNEUMOCOCCAL POLYSACCHARIDE VACCINE AGE 7-64 HIGH RISK  10/18/2020 (Originally 10/21/1967)  . Hepatitis C Screening  10/18/2020 (Originally 04-10-1965)  . HEMOGLOBIN A1C  07/20/2020  . INFLUENZA VACCINE  10/09/2020  . URINE MICROALBUMIN  01/20/2021  . OPHTHALMOLOGY EXAM  02/20/2021  . TETANUS/TDAP  08/01/2024  . COLONOSCOPY (Pts 45-42yr Insurance coverage will need to be confirmed)  08/23/2029  . HIV Screening  Completed  . HPV VACCINES  Aged Out  . COVID-19 Vaccine  Discontinued    Patient Care Team: Kaliopi Blyden, MKelby Aline [name] as PCP - General (Family Medicine) AKate Sable MD as PCP - Cardiology (Cardiology) FLloyd Huger MD as Consulting Physician (Oncology)  Review of Systems  All other systems reviewed and are negative.     Objective    BP (!) 159/79 (BP Location: Right Arm, Patient Position: Sitting, Cuff Size: Normal)   Pulse 91   Ht 5' 4"  (1.626 m)   Wt 198 lb  (89.8 kg)   SpO2 100%   BMI 33.99 kg/m  Physical Exam Constitutional:      General: He is not in acute distress.    Appearance: Normal appearance. He is well-developed. He is not ill-appearing, toxic-appearing or diaphoretic.  HENT:     Head: Normocephalic and atraumatic.     Right Ear: Hearing, tympanic membrane, ear canal and external ear normal.     Left Ear: Hearing, tympanic membrane, ear canal and external ear normal.     Nose: Nose normal. No congestion or rhinorrhea.     Mouth/Throat:     Mouth: Mucous membranes are moist.     Pharynx: Oropharynx is clear. Uvula midline. No oropharyngeal exudate or posterior oropharyngeal erythema.  Eyes:     General: Lids are normal. No scleral icterus.       Right eye: No discharge.        Left eye: No discharge.     Conjunctiva/sclera: Conjunctivae normal.     Pupils: Pupils are equal, round, and reactive to light.  Neck:     Thyroid: No thyromegaly.     Vascular: Normal carotid pulses. No carotid bruit, hepatojugular reflux or JVD.     Trachea: Trachea and phonation normal. No tracheal tenderness or tracheal deviation.     Meningeal: Brudzinski's sign absent.  Cardiovascular:     Rate and Rhythm: Normal rate and regular rhythm.     Pulses: Normal pulses.     Heart sounds: Normal heart sounds, S1 normal and S2 normal. Heart sounds not distant. No murmur heard. No friction rub. No gallop.   Pulmonary:     Effort: Pulmonary effort is normal. No accessory muscle usage or respiratory distress.     Breath sounds: Normal breath sounds. No wheezing, rhonchi or rales.  Chest:     Chest wall: No tenderness.  Abdominal:     General: Bowel sounds are normal. There is no distension.     Palpations: Abdomen is soft. There is no mass.     Tenderness: There is no abdominal tenderness. There is no right CVA tenderness, left CVA tenderness, guarding or rebound.  Hernia: No hernia is present.  Musculoskeletal:        General: No swelling,  tenderness, deformity or signs of injury. Normal range of motion.     Cervical back: Full passive range of motion without pain, normal range of motion and neck supple.     Right lower leg: No edema.     Left lower leg: No edema.       Feet:  Lymphadenopathy:     Head:     Right side of head: No submental, submandibular, tonsillar, preauricular, posterior auricular or occipital adenopathy.     Left side of head: No submental, submandibular, tonsillar, preauricular, posterior auricular or occipital adenopathy.     Cervical: No cervical adenopathy.  Skin:    General: Skin is warm and dry.     Coloration: Skin is not pale.     Findings: No erythema or rash.     Nails: There is no clubbing.  Neurological:     General: No focal deficit present.     Mental Status: He is alert and oriented to person, place, and time.     GCS: GCS eye subscore is 4. GCS verbal subscore is 5. GCS motor subscore is 6.     Cranial Nerves: No cranial nerve deficit.     Sensory: No sensory deficit.     Motor: No weakness or abnormal muscle tone.     Coordination: Coordination normal.     Gait: Gait normal.     Deep Tendon Reflexes: Reflexes are normal and symmetric. Reflexes normal.  Psychiatric:        Mood and Affect: Mood normal.        Speech: Speech normal.        Behavior: Behavior normal.        Thought Content: Thought content normal.        Judgment: Judgment normal.     Depression Screen PHQ 2/9 Scores 06/16/2020 10/19/2019 08/19/2019 08/11/2019  PHQ - 2 Score 0 0 0 1  PHQ- 9 Score 2 - 3 9   No results found for any visits on 06/16/20.  Assessment & Plan     DM type 2, uncontrolled, with neuropathy ([place]) - Plan: Hemoglobin A1c, POCT UA - Microalbumin, Semaglutide,0.25 or 0.5MG/DOS, (OZEMPIC, 0.25 OR 0.5 MG/DOSE,) 2 MG/1.5ML SOPN, insulin glargine (LANTUS) 100 UNIT/ML Solostar Pen, glucose blood test strip  Screening for blood or protein in urine - Plan: POCT Urinalysis Dip Manual  Diabetes  mellitus without complication (HCC)  Thrombocytosis  Primary hypertension - Plan: CBC with Differential/Platelet, Comprehensive metabolic panel, Lipid panel, TSH  Benign prostatic hyperplasia with hesitancy - Plan: PSA  B12 deficiency - Plan: tamsulosin (FLOMAX) 0.4 MG CAPS capsule, B12  Contact dermatitis, unspecified contact dermatitis type, unspecified trigger - Plan: nystatin-triamcinolone ointment (MYCOLOG)    Return in about 2 weeks (around 06/30/2020), or if symptoms worsen or fail to improve, for at any time for any worsening symptoms, Go to Emergency room/ urgent care if worse.      Meds ordered this encounter  Medications  . Semaglutide,0.25 or 0.5MG/DOS, (OZEMPIC, 0.25 OR 0.5 MG/DOSE,) 2 MG/1.5ML SOPN    Sig: Inject 0.5 mg into the skin once a week.    Dispense:  1.5 mL    Refill:  1  . insulin glargine (LANTUS) 100 UNIT/ML Solostar Pen    Sig: Inject 30 Units into the skin at bedtime.    Dispense:  15 mL    Refill:  3  Discontinue metformin from list he could not tolerate.  [name] glucose blood test strip    Sig: 1 each by Other route in the morning and at bedtime. Use to test blood sugar twice daily.    Dispense:  100 each    Refill:  3  . tamsulosin (FLOMAX) 0.4 MG CAPS capsule    Sig: Take 1 capsule (0.4 mg total) by mouth daily.    Dispense:  90 capsule    Refill:  0  . nystatin-triamcinolone ointment (MYCOLOG)    Sig: Apply 1 application topically 2 (two) times daily.    Dispense:  60 g    Refill:  0  he is not taking Protonix at all does not want denies any GERD symptoms.  Keep log of blood glucose for next visit. Monitor for hyper and hypoglycemia.  He tried Metformin and Metformin XR and continue to have continuous diarrhea he has stopped that on his own.  We will start back Ozempic and he will do Lantus 30 units at bedtime with a goal of reducing his Lantus as needed.  He can call if any low blood sugars occur.  He also request a refill on his Flomax for  BPH.    Medications Discontinued During This Encounter  Medication Reason  . metFORMIN (GLUCOPHAGE-XR) 500 MG 24 hr tablet Patient Preference  . varenicline (CHANTIX) 1 MG tablet Completed Course  . pantoprazole (PROTONIX) 40 MG tablet Completed Course  . insulin glargine (LANTUS) 100 UNIT/ML Solostar Pen   . tamsulosin (FLOMAX) 0.4 MG CAPS capsule Reorder  . glucose blood test strip Reorder  . Semaglutide,0.25 or 0.5MG/DOS, (OZEMPIC, 0.25 OR 0.5 MG/DOSE,) 2 MG/1.5ML SOPN Reorder   Red Flags discussed. The patient was given clear instructions to go to ER or return to medical center if any red flags develop, symptoms do not improve, worsen or new problems develop. They verbalized understanding.   The entirety of the information documented in the History of Present Illness, Review of Systems and Physical Exam were personally obtained by me. Portions of this information were initially documented by the CMA and reviewed by me for thoroughness and accuracy.      Marcille Buffy, [place] [phone] (phone) [phone] (fax)  [place]

## 2020-06-16 NOTE — Patient Instructions (Addendum)
Health Maintenance, Male Adopting a healthy lifestyle and getting preventive care are important in promoting health and wellness. Ask your health care provider about:  The right schedule for you to have regular tests and exams.  Things you can do on your own to prevent diseases and keep yourself healthy. What should I know about diet, weight, and exercise? Eat a healthy diet  Eat a diet that includes plenty of vegetables, fruits, low-fat dairy products, and lean protein.  Do not eat a lot of foods that are high in solid fats, added sugars, or sodium.   Maintain a healthy weight Body mass index (BMI) is a measurement that can be used to identify possible weight problems. It estimates body fat based on height and weight. Your health care provider can help determine your BMI and help you achieve or maintain a healthy weight. Get regular exercise Get regular exercise. This is one of the most important things you can do for your health. Most adults should:  Exercise for at least 150 minutes each week. The exercise should increase your heart rate and make you sweat (moderate-intensity exercise).  Do strengthening exercises at least twice a week. This is in addition to the moderate-intensity exercise.  Spend less time sitting. Even light physical activity can be beneficial. Watch cholesterol and blood lipids Have your blood tested for lipids and cholesterol at 55 years of age, then have this test every 5 years. You may need to have your cholesterol levels checked more often if:  Your lipid or cholesterol levels are high.  You are older than 55 years of age.  You are at high risk for heart disease. What should I know about cancer screening? Many types of cancers can be detected early and may often be prevented. Depending on your health history and family history, you may need to have cancer screening at various ages. This may include screening for:  Colorectal cancer.  Prostate  cancer.  Skin cancer.  Lung cancer. What should I know about heart disease, diabetes, and high blood pressure? Blood pressure and heart disease  High blood pressure causes heart disease and increases the risk of stroke. This is more likely to develop in people who have high blood pressure readings, are of African descent, or are overweight.  Talk with your health care provider about your target blood pressure readings.  Have your blood pressure checked: ? Every 3-5 years if you are 18-39 years of age. ? Every year if you are 40 years old or older.  If you are between the ages of 65 and 75 and are a current or former smoker, ask your health care provider if you should have a one-time screening for abdominal aortic aneurysm (AAA). Diabetes Have regular diabetes screenings. This checks your fasting blood sugar level. Have the screening done:  Once every three years after age 45 if you are at a normal weight and have a low risk for diabetes.  More often and at a younger age if you are overweight or have a high risk for diabetes. What should I know about preventing infection? Hepatitis B If you have a higher risk for hepatitis B, you should be screened for this virus. Talk with your health care provider to find out if you are at risk for hepatitis B infection. Hepatitis C Blood testing is recommended for:  Everyone born from 1945 through 1965.  Anyone with known risk factors for hepatitis C. Sexually transmitted infections (STIs)  You should be screened each   year for STIs, including gonorrhea and chlamydia, if: ? You are sexually active and are younger than 55 years of age. ? You are older than 55 years of age and your health care provider tells you that you are at risk for this type of infection. ? Your sexual activity has changed since you were last screened, and you are at increased risk for chlamydia or gonorrhea. Ask your health care provider if you are at risk.  Ask your  health care provider about whether you are at high risk for HIV. Your health care provider may recommend a prescription medicine to help prevent HIV infection. If you choose to take medicine to prevent HIV, you should first get tested for HIV. You should then be tested every 3 months for as long as you are taking the medicine. Follow these instructions at home: Lifestyle  Do not use any products that contain nicotine or tobacco, such as cigarettes, e-cigarettes, and chewing tobacco. If you need help quitting, ask your health care provider.  Do not use street drugs.  Do not share needles.  Ask your health care provider for help if you need support or information about quitting drugs. Alcohol use  Do not drink alcohol if your health care provider tells you not to drink.  If you drink alcohol: ? Limit how much you have to 0-2 drinks a day. ? Be aware of how much alcohol is in your drink. In the [place] one drink equals one 12 oz bottle of beer (355 mL), one 5 oz glass of wine (148 mL), or one 1 oz glass of hard liquor (44 mL). General instructions  Schedule regular health, dental, and eye exams.  Stay current with your vaccines.  Tell your health care provider if: ? You often feel depressed. ? You have ever been abused or do not feel safe at home. Summary  Adopting a healthy lifestyle and getting preventive care are important in promoting health and wellness.  Follow your health care provider's instructions about healthy diet, exercising, and getting tested or screened for diseases.  Follow your health care provider's instructions on monitoring your cholesterol and blood pressure. This information is not intended to replace advice given to you by your health care provider. Make sure you discuss any questions you have with your health care provider. Document Revised: 02/18/2018 Document Reviewed: 02/18/2018 Elsevier Patient Education  2021 [employer]. Diabetes Mellitus and  Nutrition, Adult When you have diabetes, or diabetes mellitus, it is very important to have healthy eating habits because your blood sugar (glucose) levels are greatly affected by what you eat and drink. Eating healthy foods in the right amounts, at about the same times every day, can help you:  Control your blood glucose.  Lower your risk of heart disease.  Improve your blood pressure.  Reach or maintain a healthy weight. What can affect my meal plan? Every person with diabetes is different, and each person has different needs for a meal plan. Your health care provider may recommend that you work with a dietitian to make a meal plan that is best for you. Your meal plan may vary depending on factors such as:  The calories you need.  The medicines you take.  Your weight.  Your blood glucose, blood pressure, and cholesterol levels.  Your activity level.  Other health conditions you have, such as heart or kidney disease. How do carbohydrates affect me? Carbohydrates, also called carbs, affect your blood glucose level more than any other type  of food. Eating carbs naturally raises the amount of glucose in your blood. Carb counting is a method for keeping track of how many carbs you eat. Counting carbs is important to keep your blood glucose at a healthy level, especially if you use insulin or take certain oral diabetes medicines. It is important to know how many carbs you can safely have in each meal. This is different for every person. Your dietitian can help you calculate how many carbs you should have at each meal and for each snack. How does alcohol affect me? Alcohol can cause a sudden decrease in blood glucose (hypoglycemia), especially if you use insulin or take certain oral diabetes medicines. Hypoglycemia can be a life-threatening condition. Symptoms of hypoglycemia, such as sleepiness, dizziness, and confusion, are similar to symptoms of having too much alcohol.  Do not drink  alcohol if: ? Your health care provider tells you not to drink. ? You are pregnant, may be pregnant, or are planning to become pregnant.  If you drink alcohol: ? Do not drink on an empty stomach. ? Limit how much you use to:  0-1 drink a day for women.  0-2 drinks a day for men. ? Be aware of how much alcohol is in your drink. In the [place] one drink equals one 12 oz bottle of beer (355 mL), one 5 oz glass of wine (148 mL), or one 1 oz glass of hard liquor (44 mL). ? Keep yourself hydrated with water, diet soda, or unsweetened iced tea.  Keep in mind that regular soda, juice, and other mixers may contain a lot of sugar and must be counted as carbs. What are tips for following this plan? Reading food labels  Start by checking the serving size on the "Nutrition Facts" label of packaged foods and drinks. The amount of calories, carbs, fats, and other nutrients listed on the label is based on one serving of the item. Many items contain more than one serving per package.  Check the total grams (g) of carbs in one serving. You can calculate the number of servings of carbs in one serving by dividing the total carbs by 15. For example, if a food has 30 g of total carbs per serving, it would be equal to 2 servings of carbs.  Check the number of grams (g) of saturated fats and trans fats in one serving. Choose foods that have a low amount or none of these fats.  Check the number of milligrams (mg) of salt (sodium) in one serving. Most people should limit total sodium intake to less than 2,300 mg per day.  Always check the nutrition information of foods labeled as "low-fat" or "nonfat." These foods may be higher in added sugar or refined carbs and should be avoided.  Talk to your dietitian to identify your daily goals for nutrients listed on the label. Shopping  Avoid buying canned, pre-made, or processed foods. These foods tend to be high in fat, sodium, and added sugar.  Shop around the  outside edge of the grocery store. This is where you will most often find fresh fruits and vegetables, bulk grains, fresh meats, and fresh dairy. Cooking  Use low-heat cooking methods, such as baking, instead of high-heat cooking methods like deep frying.  Cook using healthy oils, such as olive, canola, or sunflower oil.  Avoid cooking with butter, cream, or high-fat meats. Meal planning  Eat meals and snacks regularly, preferably at the same times every day. Avoid going long periods of  time without eating.  Eat foods that are high in fiber, such as fresh fruits, vegetables, beans, and whole grains. Talk with your dietitian about how many servings of carbs you can eat at each meal.  Eat 4-6 oz (112-168 g) of lean protein each day, such as lean meat, chicken, fish, eggs, or tofu. One ounce (oz) of lean protein is equal to: ? 1 oz (28 g) of meat, chicken, or fish. ? 1 egg. ?  cup (62 g) of tofu.  Eat some foods each day that contain healthy fats, such as avocado, nuts, seeds, and fish.   What foods should I eat? Fruits Berries. Apples. Oranges. Peaches. Apricots. Plums. Grapes. Mango. Papaya. Pomegranate. Kiwi. Cherries. Vegetables Lettuce. Spinach. Leafy greens, including kale, chard, collard greens, and mustard greens. Beets. Cauliflower. Cabbage. Broccoli. Carrots. Green beans. Tomatoes. Peppers. Onions. Cucumbers. Brussels sprouts. Grains Whole grains, such as whole-wheat or whole-grain bread, crackers, tortillas, cereal, and pasta. Unsweetened oatmeal. Quinoa. Brown or wild rice. Meats and other proteins Seafood. Poultry without skin. Lean cuts of poultry and beef. Tofu. Nuts. Seeds. Dairy Low-fat or fat-free dairy products such as milk, yogurt, and cheese. The items listed above may not be a complete list of foods and beverages you can eat. Contact a dietitian for more information. What foods should I avoid? Fruits Fruits canned with syrup. Vegetables Canned vegetables.  Frozen vegetables with butter or cream sauce. Grains Refined white flour and flour products such as bread, pasta, snack foods, and cereals. Avoid all processed foods. Meats and other proteins Fatty cuts of meat. Poultry with skin. Breaded or fried meats. Processed meat. Avoid saturated fats. Dairy Full-fat yogurt, cheese, or milk. Beverages Sweetened drinks, such as soda or iced tea. The items listed above may not be a complete list of foods and beverages you should avoid. Contact a dietitian for more information. Questions to ask a health care provider  Do I need to meet with a diabetes educator?  Do I need to meet with a dietitian?  What number can I call if I have questions?  When are the best times to check my blood glucose? Where to find more information:  American Diabetes Association: diabetes.org  Academy of Nutrition and Dietetics: www.eatright.[employer] of Diabetes and Digestive and Kidney Diseases: [employer]  Association of Diabetes Care and Education Specialists: www.diabeteseducator.org Summary  It is important to have healthy eating habits because your blood sugar (glucose) levels are greatly affected by what you eat and drink.  A healthy meal plan will help you control your blood glucose and maintain a healthy lifestyle.  Your health care provider may recommend that you work with a dietitian to make a meal plan that is best for you.  Keep in mind that carbohydrates (carbs) and alcohol have immediate effects on your blood glucose levels. It is important to count carbs and to use alcohol carefully. This information is not intended to replace advice given to you by your health care provider. Make sure you discuss any questions you have with your health care provider. Document Revised: 02/02/2019 Document Reviewed: 02/02/2019 Elsevier Patient Education  2021 [employer]. Semaglutide injection solution What is this medicine? SEMAGLUTIDE (Sem  a GLOO tide) is used to improve blood sugar control in adults with type 2 diabetes. This medicine may be used with other diabetes medicines. This drug may also reduce the risk of heart attack or stroke if you have type 2 diabetes and risk factors for heart disease. This medicine may  be used for other purposes; ask your health care provider or pharmacist if you have questions. COMMON BRAND NAME(S): OZEMPIC What should I tell my health care provider before I take this medicine? They need to know if you have any of these conditions:  endocrine tumors (MEN 2) or if someone in your family had these tumors  eye disease, vision problems  history of pancreatitis  kidney disease  stomach problems  thyroid cancer or if someone in your family had thyroid cancer  an unusual or allergic reaction to semaglutide, other medicines, foods, dyes, or preservatives  pregnant or trying to get pregnant  breast-feeding How should I use this medicine? This medicine is for injection under the skin of your upper leg (thigh), stomach area, or upper arm. It is given once every week (every 7 days). You will be taught how to prepare and give this medicine. Use exactly as directed. Take your medicine at regular intervals. Do not take it more often than directed. If you use this medicine with insulin, you should inject this medicine and the insulin separately. Do not mix them together. Do not give the injections right next to each other. Change (rotate) injection sites with each injection. It is important that you put your used needles and syringes in a special sharps container. Do not put them in a trash can. If you do not have a sharps container, call your pharmacist or healthcare provider to get one. A special MedGuide will be given to you by the pharmacist with each prescription and refill. Be sure to read this information carefully each time. This drug comes with INSTRUCTIONS FOR USE. Ask your pharmacist for  directions on how to use this drug. Read the information carefully. Talk to your pharmacist or health care provider if you have questions. Talk to your pediatrician regarding the use of this medicine in children. Special care may be needed. Overdosage: If you think you have taken too much of this medicine contact a poison control center or emergency room at once. NOTE: This medicine is only for you. Do not share this medicine with others. What if I miss a dose? If you miss a dose, take it as soon as you can within 5 days after the missed dose. Then take your next dose at your regular weekly time. If it has been longer than 5 days after the missed dose, do not take the missed dose. Take the next dose at your regular time. Do not take double or extra doses. If you have questions about a missed dose, contact your health care provider for advice. What may interact with this medicine?  other medicines for diabetes Many medications may cause changes in blood sugar, these include:  alcohol containing beverages  antiviral medicines for HIV or AIDS  aspirin and aspirin-like drugs  certain medicines for blood pressure, heart disease, irregular heart beat  chromium  diuretics  male hormones, such as estrogens or progestins, birth control pills  fenofibrate  gemfibrozil  isoniazid  lanreotide  male hormones or anabolic steroids  MAOIs like Carbex, Eldepryl, Marplan, Nardil, and Parnate  medicines for weight loss  medicines for allergies, asthma, cold, or cough  medicines for depression, anxiety, or psychotic disturbances  niacin  nicotine  NSAIDs, medicines for pain and inflammation, like ibuprofen or naproxen  octreotide  pasireotide  pentamidine  phenytoin  probenecid  quinolone antibiotics such as ciprofloxacin, levofloxacin, ofloxacin  some herbal dietary supplements  steroid medicines such as prednisone or cortisone  sulfamethoxazole;  trimethoprim  thyroid hormones Some medications can hide the warning symptoms of low blood sugar (hypoglycemia). You may need to monitor your blood sugar more closely if you are taking one of these medications. These include:  beta-blockers, often used for high blood pressure or heart problems (examples include atenolol, metoprolol, propranolol)  clonidine  guanethidine  reserpine This list may not describe all possible interactions. Give your health care provider a list of all the medicines, herbs, non-prescription drugs, or dietary supplements you use. Also tell them if you smoke, drink alcohol, or use illegal drugs. Some items may interact with your medicine. What should I watch for while using this medicine? Visit your doctor or health care professional for regular checks on your progress. Drink plenty of fluids while taking this medicine. Check with your doctor or health care professional if you get an attack of severe diarrhea, nausea, and vomiting. The loss of too much body fluid can make it dangerous for you to take this medicine. A test called the HbA1C (A1C) will be monitored. This is a simple blood test. It measures your blood sugar control over the last 2 to 3 months. You will receive this test every 3 to 6 months. Learn how to check your blood sugar. Learn the symptoms of low and high blood sugar and how to manage them. Always carry a quick-source of sugar with you in case you have symptoms of low blood sugar. Examples include hard sugar candy or glucose tablets. Make sure others know that you can choke if you eat or drink when you develop serious symptoms of low blood sugar, such as seizures or unconsciousness. They must get medical help at once. Tell your doctor or health care professional if you have high blood sugar. You might need to change the dose of your medicine. If you are sick or exercising more than usual, you might need to change the dose of your medicine. Do not skip  meals. Ask your doctor or health care professional if you should avoid alcohol. Many nonprescription cough and cold products contain sugar or alcohol. These can affect blood sugar. Pens should never be shared. Even if the needle is changed, sharing may result in passing of viruses like hepatitis or HIV. Wear a medical ID bracelet or chain, and carry a card that describes your disease and details of your medicine and dosage times. Do not become pregnant while taking this medicine. Women should inform their doctor if they wish to become pregnant or think they might be pregnant. There is a potential for serious side effects to an unborn child. Talk to your health care professional or pharmacist for more information. What side effects may I notice from receiving this medicine? Side effects that you should report to your doctor or health care professional as soon as possible:  allergic reactions like skin rash, itching or hives, swelling of the face, lips, or tongue  breathing problems  changes in vision  diarrhea that continues or is severe  lump or swelling on the neck  severe nausea  signs and symptoms of infection like fever or chills; cough; sore throat; pain or trouble passing urine  signs and symptoms of low blood sugar such as feeling anxious, confusion, dizziness, increased hunger, unusually weak or tired, sweating, shakiness, cold, irritable, headache, blurred vision, fast heartbeat, loss of consciousness  signs and symptoms of kidney injury like trouble passing urine or change in the amount of urine  trouble swallowing  unusual stomach upset or pain  vomiting Side effects that usually do not require medical attention (report to your doctor or health care professional if they continue or are bothersome):  constipation  diarrhea  nausea  pain, redness, or irritation at site where injected  stomach upset This list may not describe all possible side effects. Call your  doctor for medical advice about side effects. You may report side effects to FDA at 1-800-FDA-1088. Where should I keep my medicine? Keep out of the reach of children. Store unopened pens in a refrigerator between 2 and 8 degrees C (36 and 46 degrees F). Do not freeze. Protect from light and heat. After you first use the pen, it can be stored for 56 days at room temperature between 15 and 30 degrees C (59 and 86 degrees F) or in a refrigerator. Throw away your used pen after 56 days or after the expiration date, whichever comes first. Do not store your pen with the needle attached. If the needle is left on, medicine may leak from the pen. NOTE: This sheet is a summary. It may not cover all possible information. If you have questions about this medicine, talk to your doctor, pharmacist, or health care provider.  2021 Elsevier/Gold Standard (2018-11-10 09:41:51) Insulin Glargine injection What is this medicine? INSULIN GLARGINE (IN su lin GLAR geen) is a human-made form of insulin. This drug lowers the amount of sugar in your blood. It is a long-acting insulin that is usually given once a day. This medicine may be used for other purposes; ask your health care provider or pharmacist if you have questions. COMMON BRAND NAME(S): BASAGLAR, Lantus, Lantus SoloStar, Semglee, Toujeo Max SoloStar, [employer] What should I tell my health care provider before I take this medicine? They need to know if you have any of these conditions:  episodes of low blood sugar  eye disease, vision problems  kidney disease  liver disease  an unusual or allergic reaction to insulin, metacresol, other medicines, foods, dyes, or preservatives  pregnant or trying to get pregnant  breast-feeding How should I use this medicine? This medicine is for injection under the skin. Use this medicine at the same time each day. Use exactly as directed. This insulin should never be mixed in the same syringe with other  insulins before injection. Do not vigorously shake before use. You will be taught how to use this medicine and how to adjust doses for activities and illness. Do not use more insulin than prescribed. Always check the appearance of your insulin before using it. This medicine should be clear and colorless like water. Do not use it if it is cloudy, thickened, colored, or has solid particles in it. If you use an insulin pen, be sure to take off the outer needle cover before using the dose. It is important that you put your used needles and syringes in a special sharps container. Do not put them in a trash can. If you do not have a sharps container, call your pharmacist or healthcare provider to get one. This drug comes with INSTRUCTIONS FOR USE. Ask your pharmacist for directions on how to use this drug. Read the information carefully. Talk to your pharmacist or health care provider if you have questions. Talk to your pediatrician regarding the use of this medicine in children. While this drug may be prescribed for children as young as 6 years for selected conditions, precautions do apply. Overdosage: If you think you have taken too much of this medicine contact a poison control  center or emergency room at once. NOTE: This medicine is only for you. Do not share this medicine with others. What if I miss a dose? It is important not to miss a dose. Your health care professional or doctor should discuss a plan for missed doses with you. If you do miss a dose, follow their plan. Do not take double doses. What may interact with this medicine?  other medicines for diabetes Many medications may cause changes in blood sugar, these include:  alcohol containing beverages  antiviral medicines for HIV or AIDS  aspirin and aspirin-like drugs  certain medicines for blood pressure, heart disease, irregular heart beat  chromium  diuretics  male hormones, such as estrogens or progestins, birth control  pills  fenofibrate  gemfibrozil  isoniazid  lanreotide  male hormones or anabolic steroids  MAOIs like Carbex, Eldepryl, Marplan, Nardil, and Parnate  medicines for weight loss  medicines for allergies, asthma, cold, or cough  medicines for depression, anxiety, or psychotic disturbances  niacin  nicotine  NSAIDs, medicines for pain and inflammation, like ibuprofen or naproxen  octreotide  pasireotide  pentamidine  phenytoin  probenecid  quinolone antibiotics such as ciprofloxacin, levofloxacin, ofloxacin  some herbal dietary supplements  steroid medicines such as prednisone or cortisone  sulfamethoxazole; trimethoprim  thyroid hormones Some medications can hide the warning symptoms of low blood sugar (hypoglycemia). You may need to monitor your blood sugar more closely if you are taking one of these medications. These include:  beta-blockers, often used for high blood pressure or heart problems (examples include atenolol, metoprolol, propranolol)  clonidine  guanethidine  reserpine This list may not describe all possible interactions. Give your health care provider a list of all the medicines, herbs, non-prescription drugs, or dietary supplements you use. Also tell them if you smoke, drink alcohol, or use illegal drugs. Some items may interact with your medicine. What should I watch for while using this medicine? Visit your health care professional or doctor for regular checks on your progress. Do not drive, use machinery, or do anything that needs mental alertness until you know how this medicine affects you. Alcohol may interfere with the effect of this medicine. Avoid alcoholic drinks. A test called the HbA1C (A1C) will be monitored. This is a simple blood test. It measures your blood sugar control over the last 2 to 3 months. You will receive this test every 3 to 6 months. Learn how to check your blood sugar. Learn the symptoms of low and high blood  sugar and how to manage them. Always carry a quick-source of sugar with you in case you have symptoms of low blood sugar. Examples include hard sugar candy or glucose tablets. Make sure others know that you can choke if you eat or drink when you develop serious symptoms of low blood sugar, such as seizures or unconsciousness. They must get medical help at once. Tell your doctor or health care professional if you have high blood sugar. You might need to change the dose of your medicine. If you are sick or exercising more than usual, you might need to change the dose of your medicine. Do not skip meals. Ask your doctor or health care professional if you should avoid alcohol. Many nonprescription cough and cold products contain sugar or alcohol. These can affect blood sugar. Make sure that you have the right kind of syringe for the type of insulin you use. Try not to change the brand and type of insulin or syringe unless your  health care professional or doctor tells you to. Switching insulin brand or type can cause dangerously high or low blood sugar. Always keep an extra supply of insulin, syringes, and needles on hand. Use a syringe one time only. Throw away syringe and needle in a closed container to prevent accidental needle sticks. Insulin pens and cartridges should never be shared. Even if the needle is changed, sharing may result in passing of viruses like hepatitis or HIV. Each time you get a new box of pen needles, check to see if they are the same type as the ones you were trained to use. If not, ask your health care professional to show you how to use this new type properly. Wear a medical ID bracelet or chain, and carry a card that describes your disease and details of your medicine and dosage times. What side effects may I notice from receiving this medicine? Side effects that you should report to your doctor or health care professional as soon as possible:  allergic reactions like skin rash,  itching or hives, swelling of the face, lips, or tongue  breathing problems  signs and symptoms of high blood sugar such as dizziness, dry mouth, dry skin, fruity breath, nausea, stomach pain, increased hunger or thirst, increased urination  signs and symptoms of low blood sugar such as feeling anxious, confusion, dizziness, increased hunger, unusually weak or tired, sweating, shakiness, cold, irritable, headache, blurred vision, fast heartbeat, loss of consciousness Side effects that usually do not require medical attention (report to your doctor or health care professional if they continue or are bothersome):  increase or decrease in fatty tissue under the skin due to overuse of a particular injection site  itching, burning, swelling, or rash at site where injected This list may not describe all possible side effects. Call your doctor for medical advice about side effects. You may report side effects to FDA at 1-800-FDA-1088. Where should I keep my medicine? Keep out of the reach of children. Unopened Vials: Lantus vials: Store in a refrigerator between 2 and 8 degrees C (36 and 46 degrees F) or at room temperature below 30 degrees C (86 degrees F). Do not freeze or use if the insulin has been frozen. Protect from light and excessive heat. If stored at room temperature, the vial must be discarded after 28 days. Throw away any unopened and unused medicine that has been stored in the refrigerator after the expiration date. Unopened Pens: Neurosurgeon: Store in a refrigerator between 2 and 8 degrees C (36 and 46 degrees F) or at room temperature below 30 degrees C (86 degrees F). Do not freeze or use if the insulin has been frozen. Protect from light and excessive heat. If stored at room temperature, the pen must be discarded after 28 days. Throw away any unopened and unused medicine that has been stored in the refrigerator after the expiration date. Lantus Solostar Pens: Store in a  refrigerator between 2 and 8 degrees C (36 and 46 degrees F) or at room temperature below 30 degrees C (86 degrees F). Do not freeze or use if the insulin has been frozen. Protect from light and excessive heat. If stored at room temperature, the pen must be discarded after 28 days. Throw away any unopened and unused medicine that has been stored in the refrigerator after the expiration date. Semglee Pens: Store in a refrigerator between 2 and 8 degrees C (36 and 46 degrees F) or at room temperature below 30 degrees C (  86 degrees F). Do not freeze or use if the insulin has been frozen. Protect from light and excessive heat. If stored at room temperature, the pen must be discarded after 28 days. Throw away any unopened and unused medicine that has been stored in the refrigerator after the expiration date. Toujeo Solostar Pens or Toujeo [employer] Pens: Store in a refrigerator between 2 and 8 degrees C (36 and 46 degrees F). Do not freeze or use if the insulin has been frozen. Protect from light and excessive heat. Throw away any unopened and unused medicine that has been stored in the refrigerator after the expiration date. Vials that you are using: Lantus vials: Store in a refrigerator or at room temperature below 30 degrees C (86 degrees F). Do not freeze. Keep away from heat and light. Throw the opened vial away after 28 days. Semglee vials: Store in a refrigerator or at room temperature below 30 degrees C (86 degrees F). Do not freeze. Keep away from heat and light. Throw the opened vial away after 28 days. Pens that you are using: Basaglar KwikPens: Store at room temperature below 30 degrees C (86 degrees F). Do not refrigerate or freeze. Keep away from heat and light. Throw the pen away after 28 days, even if it still has insulin left in it. Lantus Solostar Pens: Store at room temperature below 30 degrees C (86 degrees F). Do not refrigerate or freeze. Keep away from heat and light. Throw the pen away  after 28 days, even if it still has insulin left in it. Semglee Pens: Store at room temperature below 30 degrees C (86 degrees F). Do not refrigerate or freeze. Keep away from heat and light. Throw the pen away after 28 days, even if it still has insulin left in it. Toujeo Solostar Pens or Toujeo [employer] Pens: Store at room temperature below 30 degrees C (86 degrees F). Do not refrigerate or freeze. Keep away from heat and light. Throw the pen away after 56 days, even if it still has insulin left in it. NOTE: This sheet is a summary. It may not cover all possible information. If you have questions about this medicine, talk to your doctor, pharmacist, or health care provider.  2021 Elsevier/Gold Standard (2018-12-16 15:00:23)

## 2020-06-19 ENCOUNTER — Other Ambulatory Visit: Payer: Self-pay | Admitting: Adult Health

## 2020-06-19 DIAGNOSIS — IMO0002 Reserved for concepts with insufficient information to code with codable children: Secondary | ICD-10-CM

## 2020-06-19 DIAGNOSIS — I10 Essential (primary) hypertension: Secondary | ICD-10-CM

## 2020-06-19 DIAGNOSIS — E785 Hyperlipidemia, unspecified: Secondary | ICD-10-CM

## 2020-06-19 DIAGNOSIS — D75839 Thrombocytosis, unspecified: Secondary | ICD-10-CM

## 2020-06-19 DIAGNOSIS — E1140 Type 2 diabetes mellitus with diabetic neuropathy, unspecified: Secondary | ICD-10-CM

## 2020-06-19 DIAGNOSIS — J449 Chronic obstructive pulmonary disease, unspecified: Secondary | ICD-10-CM

## 2020-06-19 NOTE — Progress Notes (Signed)
Orders Placed This Encounter  Procedures  . AMB Referral to [facility]

## 2020-06-23 ENCOUNTER — Ambulatory Visit: Payer: Medicare HMO

## 2020-06-27 ENCOUNTER — Telehealth: Payer: Self-pay | Admitting: *Deleted

## 2020-06-27 NOTE — Chronic Care Management (AMB) (Signed)
  Chronic Care Management   Note  06/27/2020 Name: Julen Rubert MRN: [id number] DOB: [birth year]  Robley Fries Kistler is a 55 y.o. year old male who is a primary care patient of Flinchum, Kelby Aline, [name]. I reached out to [employer] by phone today in response to a referral sent by Mr. Ramaj Frangos Truett's PCP, Flinchum, Kelby Aline, [name].     Mr. Faircloth was given information about Chronic Care Management services today including:  1. CCM service includes personalized support from designated clinical staff supervised by his physician, including individualized plan of care and coordination with other care providers 2. 24/7 contact phone numbers for assistance for urgent and routine care needs. 3. Service will only be billed when office clinical staff spend 20 minutes or more in a month to coordinate care. 4. Only one practitioner may furnish and bill the service in a calendar month. 5. The patient may stop CCM services at any time (effective at the end of the month) by phone call to the office staff. 6. The patient will be responsible for cost sharing (co-pay) of up to 20% of the service fee (after annual deductible is met).  Patient agreed to services and verbal consent obtained.   Follow up plan: Telephone appointment with care management team member scheduled for: PharmD 0/04/3015 RNCM 2/0/9106 Licensed Clinical SW 07/04/2020  [facility]

## 2020-06-30 ENCOUNTER — Other Ambulatory Visit: Payer: Self-pay

## 2020-06-30 ENCOUNTER — Encounter: Payer: Self-pay | Admitting: Internal Medicine

## 2020-06-30 ENCOUNTER — Inpatient Hospital Stay: Payer: Medicare HMO | Attending: Internal Medicine

## 2020-06-30 ENCOUNTER — Inpatient Hospital Stay: Payer: Medicare HMO

## 2020-06-30 ENCOUNTER — Inpatient Hospital Stay (HOSPITAL_BASED_OUTPATIENT_CLINIC_OR_DEPARTMENT_OTHER): Payer: Medicare HMO | Admitting: Internal Medicine

## 2020-06-30 VITALS — BP 153/95 | HR 96 | Temp 97.2°F | Resp 16 | Wt 193.8 lb

## 2020-06-30 DIAGNOSIS — D75839 Thrombocytosis, unspecified: Secondary | ICD-10-CM | POA: Insufficient documentation

## 2020-06-30 DIAGNOSIS — D500 Iron deficiency anemia secondary to blood loss (chronic): Secondary | ICD-10-CM | POA: Diagnosis not present

## 2020-06-30 DIAGNOSIS — Z79899 Other long term (current) drug therapy: Secondary | ICD-10-CM | POA: Insufficient documentation

## 2020-06-30 DIAGNOSIS — Z87891 Personal history of nicotine dependence: Secondary | ICD-10-CM | POA: Diagnosis not present

## 2020-06-30 DIAGNOSIS — E538 Deficiency of other specified B group vitamins: Secondary | ICD-10-CM | POA: Diagnosis not present

## 2020-06-30 LAB — CBC WITH DIFFERENTIAL/PLATELET
Abs Immature Granulocytes: 0.03 10*3/uL (ref 0.00–0.07)
Basophils Absolute: 0.1 10*3/uL (ref 0.0–0.1)
Basophils Relative: 1 %
Eosinophils Absolute: 0.2 10*3/uL (ref 0.0–0.5)
Eosinophils Relative: 2 %
HCT: 39.4 % (ref 39.0–52.0)
Hemoglobin: 13.1 g/dL (ref 13.0–17.0)
Immature Granulocytes: 0 %
Lymphocytes Relative: 47 %
Lymphs Abs: 3.7 10*3/uL (ref 0.7–4.0)
MCH: 29.8 pg (ref 26.0–34.0)
MCHC: 33.2 g/dL (ref 30.0–36.0)
MCV: 89.7 fL (ref 80.0–100.0)
Monocytes Absolute: 0.7 10*3/uL (ref 0.1–1.0)
Monocytes Relative: 9 %
Neutro Abs: 3.3 10*3/uL (ref 1.7–7.7)
Neutrophils Relative %: 41 %
Platelets: 426 10*3/uL — ABNORMAL HIGH (ref 150–400)
RBC: 4.39 MIL/uL (ref 4.22–5.81)
RDW: 13.8 % (ref 11.5–15.5)
WBC: 8 10*3/uL (ref 4.0–10.5)
nRBC: 0 % (ref 0.0–0.2)

## 2020-06-30 LAB — IRON AND TIBC
Iron: 64 ug/dL (ref 45–182)
Saturation Ratios: 15 % — ABNORMAL LOW (ref 17.9–39.5)
TIBC: 435 ug/dL (ref 250–450)
UIBC: 371 ug/dL

## 2020-06-30 LAB — VITAMIN B12: Vitamin B-12: 220 pg/mL (ref 180–914)

## 2020-06-30 LAB — FERRITIN: Ferritin: 61 ng/mL (ref 24–336)

## 2020-06-30 MED ORDER — IRON SUCROSE 20 MG/ML IV SOLN
200.0000 mg | Freq: Once | INTRAVENOUS | Status: AC
  Administered 2020-06-30: 200 mg via INTRAVENOUS
  Filled 2020-06-30: qty 10

## 2020-06-30 MED ORDER — SODIUM CHLORIDE 0.9 % IV SOLN
INTRAVENOUS | Status: DC
  Filled 2020-06-30: qty 250

## 2020-06-30 MED ORDER — CYANOCOBALAMIN 1000 MCG/ML IJ SOLN
1000.0000 ug | Freq: Once | INTRAMUSCULAR | Status: AC
  Administered 2020-06-30: 1000 ug via INTRAMUSCULAR
  Filled 2020-06-30: qty 1

## 2020-06-30 MED ORDER — SODIUM CHLORIDE 0.9 % IV SOLN: 200.0000 mg | Freq: Once | INTRAVENOUS | Status: DC

## 2020-06-30 NOTE — Assessment & Plan Note (Addendum)
#   Iron deficiency anemia-hemoglobin 13; however given recent iron deficiency proceed with Venofer today.   #Etiology- EGD/Colo [Dr.Wohl] June 2021 revealed a total of 4 angiodysplastic lesions that were cauterized with argon laser. Monitor closely.   # Thrombocytosis-446; JAK2 mutation negative; question other MPN Vs-reactive secondary deficiency- check MPL/CALR mutation  # B12 deficiency: Proceed with 1000 mcg intramuscular B12 today  # DISPOSITION: # venofer today # b12 injection # follow up in 4 months- MD; labs- cbc/bmp- venofer/B12-Dr.B

## 2020-06-30 NOTE — Progress Notes (Signed)
[place] NOTE  Patient Care Team: Flinchum, Kelby Aline, [name] as PCP - General (Family Medicine) [name], MD as PCP - Cardiology (Cardiology) Lloyd Huger, MD as Consulting Physician (Oncology) Germaine Pomfret, [facility] (Pharmacist) [place], [place], LCSW as Social Worker Neldon Labella, RN as Registered Nurse  CHIEF COMPLAINTS/PURPOSE OF CONSULTATION: Anemia sec to Hx of GIB  # ANEMIA sec to GIB [Dr.Finnegan; s/p EGD/colo- June 2021-s/p angiodysplasia small bowel/large bowel status post argon laser Dr.Wohl]  # Thrombocytosis [JAK-2 NEG]  # B12 def- on B12 injections  Oncology History   No history exists.     HISTORY OF PRESENTING ILLNESS:  Benjamin Conley 55 y.o.  male patient with a history of iron deficient anemia secondary to chronic GI bleed/angiodysplasia is here for follow-up.  Patient is here IV Venofer x4 in January 2022-for iron deficiency hemoglobin around 11.  Today feels good.  Denies any blood in stools or black-colored stools.  Mild to moderate fatigue.   Review of Systems  Constitutional: Negative for chills, diaphoresis, fever, malaise/fatigue and weight loss.  HENT: Negative for nosebleeds and sore throat.   Eyes: Negative for double vision.  Respiratory: Negative for cough, hemoptysis, sputum production, shortness of breath and wheezing.   Cardiovascular: Negative for chest pain, palpitations, orthopnea and leg swelling.  Gastrointestinal: Negative for abdominal pain, blood in stool, constipation, diarrhea, heartburn, melena, nausea and vomiting.  Genitourinary: Negative for dysuria, frequency and urgency.  Musculoskeletal: Negative for back pain and joint pain.  Skin: Negative.  Negative for itching and rash.  Neurological: Negative for dizziness, tingling, focal weakness, weakness and headaches.  Endo/Heme/Allergies: Does not bruise/bleed easily.  Psychiatric/Behavioral: Negative for depression. The patient is  not nervous/anxious and does not have insomnia.      MEDICAL HISTORY:  Past Medical History:  Diagnosis Date  . Acute posthemorrhagic anemia   . Complication of anesthesia    c/o difficulty breathing after anesthesia  . COPD (chronic obstructive pulmonary disease) ([place])   . Diabetes mellitus without complication ([place])   . Hyperlipemia   . Melena 08/21/2019  . Paroxysmal supraventricular tachycardia ([place]) 08/19/2019  . Postural dizziness with presyncope 08/21/2019  . 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 Tobacco abuse   . Upper GI bleed 08/21/2019    SURGICAL HISTORY: Past Surgical History:  Procedure Laterality Date  . BACK SURGERY     lumbar  . COLONOSCOPY WITH PROPOFOL N/A 08/24/2019   Procedure: COLONOSCOPY WITH PROPOFOL;  Surgeon: [name], MD;  Location: [facility] ENDOSCOPY;  Service: Endoscopy;  Laterality: N/A;  . ESOPHAGOGASTRODUODENOSCOPY (EGD) WITH PROPOFOL N/A 08/24/2019   Procedure: ESOPHAGOGASTRODUODENOSCOPY (EGD) WITH PROPOFOL;  Surgeon: [name], MD;  Location: [facility] ENDOSCOPY;  Service: Endoscopy;  Laterality: N/A;  . STENT PLACEMENT VASCULAR ([place] [facility])  07/2019   stenosis of distal splenic artery and stent placed  . VISCERAL ANGIOGRAPHY N/A 08/06/2019   Procedure: VISCERAL ANGIOGRAPHY;  Surgeon: [name], MD;  Location: [facility] CV LAB;  Service: Cardiovascular;  Laterality: N/A;    SOCIAL HISTORY: Social History   Socioeconomic History  . Marital status: Single    Spouse name: Not on file  . Number of children: Not on file  . Years of education: Not on file  . Highest education level: Not on file  Occupational History  . Not on file  Tobacco Use  . Smoking status: Former Smoker    Packs/day: 1.00    Years: 48.00    Pack years: 48.00  Types: Cigarettes    Quit date: 02/12/2020    Years since quitting: 0.3  . Smokeless tobacco: Never Used  Vaping Use  . Vaping Use: Never used  Substance and Sexual Activity  . Alcohol use: No    Comment:  Never been a problem  . Drug use: No  . Sexual activity: Yes  Other Topics Concern  . Not on file  Social History Narrative   Single, lives alone, unemployed.   Social Determinants of Health   Financial Resource Strain: Low Risk   . Difficulty of Paying Living Expenses: Not hard at all  Food Insecurity: No Food Insecurity  . Worried About Charity fundraiser in the Last Year: Never true  . Ran Out of Food in the Last Year: Never true  Transportation Needs: No Transportation Needs  . Lack of Transportation (Medical): No  . Lack of Transportation (Non-Medical): No  Physical Activity: Not on file  Stress: Stress Concern Present  . Feeling of Stress : Very much  Social Connections: Not on file  Intimate Partner Violence: Not At Risk  . Fear of Current or Ex-Partner: No  . Emotionally Abused: No  . Physically Abused: No  . Sexually Abused: No    FAMILY HISTORY: Family History  Problem Relation Age of Onset  . Diabetes Mellitus II Mother   . Diabetes Mellitus II Brother   . Hyperlipidemia Brother   . Tuberculosis Father   . Heart disease Father   . Cancer Maternal Aunt   . Colon cancer Maternal Uncle   . Diabetes Sister   . Diabetes Sister   . Diabetes Sister   . Diabetes Sister   . Prostate cancer Neg Hx     ALLERGIES:  is allergic to wasp venom, wasp venom protein, coffee flavor, and onion.  MEDICATIONS:  Current Outpatient Medications  Medication Sig Dispense Refill  . atorvastatin (LIPITOR) 40 MG tablet Take 1 tablet (40 mg total) by mouth daily. 90 tablet 1  . blood glucose meter kit and supplies Dispense based on patient and insurance preference. Use up to four times daily as directed. (FOR ICD-10 E10.9, E11.9). 1 each 0  . clopidogrel (PLAVIX) 75 MG tablet Take 1 tablet (75 mg total) by mouth daily. 90 tablet 3  . glucose blood test strip 1 each by Other route in the morning and at bedtime. Use to test blood sugar twice daily. 100 each 3  . insulin glargine  (LANTUS) 100 UNIT/ML Solostar Pen Inject 30 Units into the skin at bedtime. 15 mL 3  . nystatin-triamcinolone ointment (MYCOLOG) Apply 1 application topically 2 (two) times daily. 60 g 0  . Semaglutide,0.25 or 0.5MG/DOS, (OZEMPIC, 0.25 OR 0.5 MG/DOSE,) 2 MG/1.5ML SOPN Inject 0.5 mg into the skin once a week. 1.5 mL 1  . tamsulosin (FLOMAX) 0.4 MG CAPS capsule Take 1 capsule (0.4 mg total) by mouth daily. 90 capsule 0  . albuterol (VENTOLIN HFA) 108 (90 Base) MCG/ACT inhaler Inhale 2 puffs into the lungs every 6 (six) hours as needed for wheezing or shortness of breath. (Patient not taking: Reported on 06/30/2020) 6.7 g 3  . aspirin EC 81 MG EC tablet Take 1 tablet (81 mg total) by mouth daily. (Patient not taking: Reported on 06/30/2020) 90 tablet 1  . Iron-Vitamin C (VITRON-C) 65-125 MG TABS Take 1 tablet by mouth daily. (Patient not taking: No sig reported) 60 tablet 0  . nicotine (NICODERM CQ - DOSED IN MG/24 HOURS) 21 mg/24hr patch Place 1 patch (  21 mg total) onto the skin daily. (Patient not taking: No sig reported) 28 patch 0  . sildenafil (VIAGRA) 100 MG tablet Take 1 tablet (100 mg total) by mouth daily as needed for erectile dysfunction. (Patient not taking: No sig reported) 30 tablet 0  . Tiotropium Bromide-Olodaterol (STIOLTO RESPIMAT) 2.5-2.5 MCG/ACT AERS Inhale 2 puffs into the lungs daily. (Patient not taking: No sig reported) 1 each 0   No current facility-administered medications for this visit.      [name]  PHYSICAL EXAMINATION:   Vitals:   06/30/20 1308  BP: (!) 153/95  Pulse: 96  Resp: 16  Temp: (!) 97.2 F (36.2 C)  SpO2: 100%   Filed Weights   06/30/20 1308  Weight: 193 lb 12.8 oz (87.9 kg)    Physical Exam HENT:     Head: Normocephalic and atraumatic.     Mouth/Throat:     Pharynx: No oropharyngeal exudate.  Eyes:     Pupils: Pupils are equal, round, and reactive to light.  Cardiovascular:     Rate and Rhythm: Normal rate and regular rhythm.  Pulmonary:      Effort: Pulmonary effort is normal. No respiratory distress.     Breath sounds: Normal breath sounds. No wheezing.  Abdominal:     General: Bowel sounds are normal. There is no distension.     Palpations: Abdomen is soft. There is no mass.     Tenderness: There is no abdominal tenderness. There is no guarding or rebound.  Musculoskeletal:        General: No tenderness. Normal range of motion.     Cervical back: Normal range of motion and neck supple.  Skin:    General: Skin is warm.  Neurological:     Mental Status: He is alert and oriented to person, place, and time.  Psychiatric:        Mood and Affect: Affect normal.      LABORATORY DATA:  I have reviewed the data as listed Lab Results  Component Value Date   WBC 8.0 06/30/2020   HGB 13.1 06/30/2020   HCT 39.4 06/30/2020   MCV 89.7 06/30/2020   PLT 426 (H) 06/30/2020   Recent Labs    08/19/19 1257 08/21/19 0318 08/21/19 1620 09/06/19 1121 09/23/19 0100 10/21/19 1432 01/21/20 1505  NA 137 139 136 137 140 140 138  K 3.9 3.2* 3.7 4.7 3.8 4.0 4.0  CL 100 101 101 99 103 103 102  CO2 30 29 25 31 25 30 26   GLUCOSE 192* 134* 268* 180* 126* 148* 159*  BUN 17 10 8 14 8 11 9   CREATININE 0.62 0.70 0.67 0.62 0.64 0.70 0.66*  CALCIUM 9.5 9.0 8.7* 9.9 9.1 9.4 9.4  GFRNONAA  --  >60 >60  --  >60  --   --   GFRAA  --  >60 >60  --  >60  --   --   PROT 7.4 7.6  --  7.4  --   --  6.8  ALBUMIN 4.1 3.8  --  4.5  --   --   --   AST 14 15  --  11  --   --  9*  ALT 20 21  --  17  --   --  14  ALKPHOS 98 78  --  79  --   --   --   BILITOT 0.2 0.6  --  0.3  --   --  0.3    RADIOGRAPHIC STUDIES:  I have personally reviewed the radiological images as listed and agreed with the findings in the report. No results found.  ASSESSMENT & PLAN:   Iron deficiency anemia due to chronic blood loss # Iron deficiency anemia-hemoglobin 13; however given recent iron deficiency proceed with Venofer today.   #Etiology- EGD/Colo [Dr.Wohl] June  2021 revealed a total of 4 angiodysplastic lesions that were cauterized with argon laser. Monitor closely.   # Thrombocytosis-446; JAK2 mutation negative; question other MPN Vs-reactive secondary deficiency- check MPL/CALR mutation  # B12 deficiency: Proceed with 1000 mcg intramuscular B12 today  # DISPOSITION: # venofer today # b12 injection # follow up in 4 months- MD; labs- cbc/bmp- venofer/B12-Dr.B   All questions were answered. The patient knows to call the clinic with any problems, questions or concerns.    Cammie Sickle, MD 06/30/2020 1:39 PM

## 2020-07-04 ENCOUNTER — Telehealth: Payer: Self-pay | Admitting: *Deleted

## 2020-07-04 ENCOUNTER — Telehealth: Payer: Medicare HMO | Admitting: *Deleted

## 2020-07-04 NOTE — Telephone Encounter (Signed)
  Chronic Care Management    Clinical Social Work CCM Outreach Note  07/04/2020 Name: Benjamin Conley MRN: [id number] DOB: [birth year]  LCSW reached out to Benjamin Conley today by phone to introduce and offer CCM services. Benjamin Conley is a 55 y.o. year old male is a primary care patient of Flinchum, Kelby Aline, [name]. asked the CCM team to consult the patient for assistance with disease manangement.   [name] was given information about Chronic Care Management services today including:  1. CCM service includes personalized support from designated clinical staff supervised by his physician, including individualized plan of care and coordination with other care providers 2. 24/7 contact phone numbers for assistance for urgent and routine care needs. 3. Service will only be billed when office clinical staff spend 20 minutes or more in a month to coordinate care. 4. Only one practitioner may furnish and bill the service in a calendar month. 5. The patient may stop CCM services at any time (effective at the end of the month) by phone call to the office staff. 6. The patient will be responsible for cost sharing (co-pay) of up to 20% of the service fee (after annual deductible is met).  Patient declined CCM social work services. However requested continued involvement with CCM RNCM and Pharmacist.  Plan:  LCSW has provided [name] with direct contact information for LCSW if new needs arise.    Elliot Gurney, [place] Worker  [facility] [phone]

## 2020-07-04 NOTE — Addendum Note (Signed)
Addended by: Vern Claude on: 07/04/2020 11:55 AM   Modules accepted: Level of Service, SmartSet

## 2020-07-04 NOTE — Telephone Encounter (Signed)
   07/04/2020  Benjamin Conley 1965/06/30 [id number]   Chronic Care Management    Clinical Social Work CCM Outreach Note  07/04/2020 Name: Benjamin Conley MRN: [id number] DOB: [birth year]  LCSW reached out to [name] today by phone to introduce and offer CCM services. Benjamin Conley is a 55 y.o. year old male is a primary care patient of Flinchum, Kelby Aline, [name]. Laverna Peace, [name] asked the CCM team to consult the patient for assistance with disease management.   Benjamin Conley was given information about Chronic Care Management services today including:  1. CCM service includes personalized support from designated clinical staff supervised by his physician, including individualized plan of care and coordination with other care providers 2. 24/7 contact phone numbers for assistance for urgent and routine care needs. 3. Service will only be billed when office clinical staff spend 20 minutes or more in a month to coordinate care. 4. Only one practitioner may furnish and bill the service in a calendar month. 5. The patient may stop CCM services at any time (effective at the end of the month) by phone call to the office staff. 6. The patient will be responsible for cost sharing (co-pay) of up to 20% of the service fee (after annual deductible is met).  Patient declined social work services. However will continue to work with the [place] and Pharmacist.  Plan:  LCSW has provided Benjamin Conley with direct contact information for LCSW if new community resource needs arise.    Elliot Gurney, [place] Worker  [facility] [phone]

## 2020-07-04 NOTE — Telephone Encounter (Signed)
This encounter was created in error - please disregard.

## 2020-07-10 ENCOUNTER — Ambulatory Visit (INDEPENDENT_AMBULATORY_CARE_PROVIDER_SITE_OTHER): Payer: Medicare HMO

## 2020-07-10 ENCOUNTER — Ambulatory Visit: Payer: Medicare HMO

## 2020-07-10 ENCOUNTER — Telehealth: Payer: Self-pay

## 2020-07-10 DIAGNOSIS — IMO0002 Reserved for concepts with insufficient information to code with codable children: Secondary | ICD-10-CM

## 2020-07-10 DIAGNOSIS — E1140 Type 2 diabetes mellitus with diabetic neuropathy, unspecified: Secondary | ICD-10-CM | POA: Diagnosis not present

## 2020-07-10 DIAGNOSIS — E1165 Type 2 diabetes mellitus with hyperglycemia: Secondary | ICD-10-CM | POA: Diagnosis not present

## 2020-07-10 NOTE — Progress Notes (Signed)
Chronic Care Management Pharmacy Assistant   Name: Benjamin Conley  MRN: [id number] DOB: Dec 14, 1965  Reason for Encounter: Chart review for CPP on 07/11/2020   Conditions to be addressed/monitored: HTN, HLD, COPD, DMII and Coagulation disorder,Benign prostatic hyperplasia with hesitancy,Dizziness,Iron deficiency,Difficulty Sleeping,, B12 deficiency   Primary concerns for visit include:  Patient states he had side effects from Metformin, is there something else he could take.  Recent office visits:  06/19/2020 Laverna Peace [name] (PCP) AMB Referral to [facility]   06/16/2020 Laverna Peace [name] (PCP) No medication change Indicated.  Recent consult visits: 06/30/2020 Dr Rogue Bussing MD ( Oncology)- No medication change indicated.  05/04/2020 Dr Garen Lah MD (Cardiology)- Stop Metoprolol  04/18/2020  Dr Garen Lah MD (Cardiology)- No change Indicated. 04/17/2020 Dr Garen Lah MD (Cardiology)- Decrease metoprolol from 50 mg to 12.5 mg daily. 03/31/2020 Faythe Casa NP (oncology) - No medication change indicated.  03/30/2020 Dr Garen Lah MD (Cardiology) -  Plavix pending upcoming dental procedure, per note metformin HCI was change in therapy from 500 mg to 250 mg two times daily. 03/21/2020 Alphonzo Severance  RPH-CPP- No medication change indicated 03/16/2020 Dr Allen Norris MD (Gastroenterology)- No medication Change indicated 02/15/2020 Alphonzo Severance RPH-CPP -No medication Change indicated. 01/27/2020 [name] NP [facility])- Started Nicotine Patch 21 mg  every 24 hour ,Started Nicotine gum 2 mg-4 mg dose,Started Bactrim DS 500 mg BID for 7 days  01/21/2020 [name] NP [facility])- Ambulatory referral to Gastroenterology,Ambulatory referral to Psychology,Consider increasing metoprolol to 50 mg daily for ST and HTN ,Referral for eye exam , Started Vitamin D weekly and then will advise 1000 IU daily to follow 01/18/2020  Dr. Lucky Cowboy MD (Vascular Surgery) -No medication Change indicated.  01/14/2020 Dr.Stoioff MD (Urology)-Cystoscopy procedure  Hospital visits:  Medication Reconciliation was completed by comparing discharge summary, patient's EMR and Pharmacy list, and upon discussion with patient.  Admitted to the hospital on 04/17/2020 due to passing out . Discharge date was 04/17/2020. Discharged from [facility].  (Patient left before begin seen)  New?Medications Started at [facility] Discharge:?? -started none  Medication Changes at Hospital Discharge: -Changed None  Medications Discontinued at Hospital Discharge: -Stopped None  Medications that remain the same after Hospital Discharge:??  -All other medications will remain the same.    Have you seen any other providers since your last visit? no Any changes in your medications or health? Yes  Patient states his metformin was causing him to have diarrhea.Patient states he stop taking his metformin.  Any side effects from any medications? Yes  Patient states his metformin was causing him to have diarrhea.Patient states he stop taking his metformin.  Do you have an symptoms or problems not managed by your medications? no Any concerns about your health right now? no Has your provider asked that you check blood pressure, blood sugar, or follow special diet at home? Yes  Patient reports he checks his blood pressure and blood sugar at home.  Blood pressure ranging 125/76,125/80.  Blood sugar ranging 190-225. Do you get any type of exercise on a regular basis? no Can you think of a goal you would like to reach for your health? None ID Do you have any problems getting your medications? Yes  Patient states his needles are not covered under his insurance. Patient would like to know which one are covered. Is there anything that you would like to discuss during the appointment?   Patient states he had side effects from  Metformin, is there  something else he could take.  Please bring medications and supplements to appointment   Medications: Outpatient Encounter Medications as of 07/10/2020  Medication Sig  . albuterol (VENTOLIN HFA) 108 (90 Base) MCG/ACT inhaler Inhale 2 puffs into the lungs every 6 (six) hours as needed for wheezing or shortness of breath. (Patient not taking: Reported on 06/30/2020)  . aspirin EC 81 MG EC tablet Take 1 tablet (81 mg total) by mouth daily. (Patient not taking: Reported on 06/30/2020)  . atorvastatin (LIPITOR) 40 MG tablet Take 1 tablet (40 mg total) by mouth daily.  . blood glucose meter kit and supplies Dispense based on patient and insurance preference. Use up to four times daily as directed. (FOR ICD-10 E10.9, E11.9).  [name] clopidogrel (PLAVIX) 75 MG tablet Take 1 tablet (75 mg total) by mouth daily.  [name] glucose blood test strip 1 each by Other route in the morning and at bedtime. Use to test blood sugar twice daily.  . insulin glargine (LANTUS) 100 UNIT/ML Solostar Pen Inject 30 Units into the skin at bedtime.  . Iron-Vitamin C (VITRON-C) 65-125 MG TABS Take 1 tablet by mouth daily. (Patient not taking: No sig reported)  . nicotine (NICODERM CQ - DOSED IN MG/24 HOURS) 21 mg/24hr patch Place 1 patch (21 mg total) onto the skin daily. (Patient not taking: No sig reported)  . nystatin-triamcinolone ointment (MYCOLOG) Apply 1 application topically 2 (two) times daily.  . Semaglutide,0.25 or 0.5MG/DOS, (OZEMPIC, 0.25 OR 0.5 MG/DOSE,) 2 MG/1.5ML SOPN Inject 0.5 mg into the skin once a week.  . sildenafil (VIAGRA) 100 MG tablet Take 1 tablet (100 mg total) by mouth daily as needed for erectile dysfunction. (Patient not taking: No sig reported)  . tamsulosin (FLOMAX) 0.4 MG CAPS capsule Take 1 capsule (0.4 mg total) by mouth daily.  . Tiotropium Bromide-Olodaterol (STIOLTO RESPIMAT) 2.5-2.5 MCG/ACT AERS Inhale 2 puffs into the lungs daily. (Patient not taking: No sig reported)   No facility-administered  encounter medications on file as of 07/10/2020.   Star Rating Drugs: Atorvastatin 40 mg last filled on 03/31/2020 for 90 day supply at [facility].  Ozempic 0.5 mg last filled on 03/10/2021 for 42 day supply at [facility].  [place] Pharmacist Assistant [phone]

## 2020-07-10 NOTE — Chronic Care Management (AMB) (Signed)
Chronic Care Management   Initial Visit Note  07/10/2020 Name: Benjamin Conley MRN: [id number] DOB: 04-28-1965  Primary Care Provider: Doreen Beam, [name] Reason for referral : Chronic Care Management   Benjamin Conley is a 55 y.o. year old male who is a primary care patient of Flinchum, Kelby Aline, [name]. The CCM team was consulted for assistance with chronic disease management and care coordination. A telephonic outreach was conducted today.  Review of Benjamin Conley status, including review of consultants reports, relevant labs and test results was conducted today. Collaboration with appropriate care team members was performed as part of the comprehensive evaluation and provision of chronic care management services.    SDOH (Social Determinants of Health) assessments performed: Yes See Care Plan activities for detailed interventions related to SDOH  SDOH Interventions   Flowsheet Row Most Recent Value  SDOH Interventions   Food Insecurity Interventions Intervention Not Indicated  Transportation Interventions Intervention Not Indicated      Medications: Outpatient Encounter Medications as of 07/10/2020  Medication Sig  . albuterol (VENTOLIN HFA) 108 (90 Base) MCG/ACT inhaler Inhale 2 puffs into the lungs every 6 (six) hours as needed for wheezing or shortness of breath. (Patient not taking: Reported on 06/30/2020)  . aspirin EC 81 MG EC tablet Take 1 tablet (81 mg total) by mouth daily. (Patient not taking: Reported on 06/30/2020)  . atorvastatin (LIPITOR) 40 MG tablet Take 1 tablet (40 mg total) by mouth daily.  . blood glucose meter kit and supplies Dispense based on patient and insurance preference. Use up to four times daily as directed. (FOR ICD-10 E10.9, E11.9).  [name] clopidogrel (PLAVIX) 75 MG tablet Take 1 tablet (75 mg total) by mouth daily.  [name] glucose blood test strip 1 each by Other route in the morning and at bedtime. Use to test blood sugar twice daily.  . insulin  glargine (LANTUS) 100 UNIT/ML Solostar Pen Inject 30 Units into the skin at bedtime.  . Iron-Vitamin C (VITRON-C) 65-125 MG TABS Take 1 tablet by mouth daily. (Patient not taking: No sig reported)  . nicotine (NICODERM CQ - DOSED IN MG/24 HOURS) 21 mg/24hr patch Place 1 patch (21 mg total) onto the skin daily. (Patient not taking: No sig reported)  . nystatin-triamcinolone ointment (MYCOLOG) Apply 1 application topically 2 (two) times daily.  . Semaglutide,0.25 or 0.5MG/DOS, (OZEMPIC, 0.25 OR 0.5 MG/DOSE,) 2 MG/1.5ML SOPN Inject 0.5 mg into the skin once a week.  . sildenafil (VIAGRA) 100 MG tablet Take 1 tablet (100 mg total) by mouth daily as needed for erectile dysfunction. (Patient not taking: No sig reported)  . tamsulosin (FLOMAX) 0.4 MG CAPS capsule Take 1 capsule (0.4 mg total) by mouth daily.  . Tiotropium Bromide-Olodaterol (STIOLTO RESPIMAT) 2.5-2.5 MCG/ACT AERS Inhale 2 puffs into the lungs daily. (Patient not taking: No sig reported)   No facility-administered encounter medications on file as of 07/10/2020.     Objective:  Patient Care Plan: Diabetes Type 2 (Adult)    Problem Identified: Disease Progression (Diabetes, Type 2)     Long-Range Goal: Disease Progression Prevented or Minimized   Start Date: 07/10/2020  Expected End Date: 11/07/2020  Priority: High  Note:   Objective:  Lab Results  Component Value Date   HGBA1C 8.0 (H) 01/21/2020 .   Lab Results  Component Value Date   CREATININE 0.66 (L) 01/21/2020   CREATININE 0.70 10/21/2019   CREATININE 0.64 09/23/2019 .   [name] No results found for: EGFR   Interventions:  .  Collaboration with Flinchum, Kelby Aline, [name] regarding development and update of comprehensive plan of care as evidenced by provider attestation and co-signature . Inter-disciplinary care team collaboration (see longitudinal plan of care) . Reviewed medications. Reports taking medications as prescribed. Denies concerns regarding medication management.  Encouraged to take medications as prescribed and notify provider if unable to tolerate regimen. Encouraged to keep the CCM Pharmacist updated of concerns regarding prescription cost.  . Discussed importance of consistently monitoring blood glucose and reviewed home readings. Discussed s/sx of hypoglycemia and hyperglycemia along with recommended interventions. Reports readings have been elevated over the past few weeks. Reports a fasting range from 163 to 227m/dl. Attributes the elevations to snacking. He is attempting to comply with a diabetic diet but reports he recently quit smoking and tends to snack frequently. Reports often reaching for soft drinks and cookie bars. Discussed low carb snack options to assist with maintaining optimal glycemic control. Advised to monitor blood glucose consistently and recheck when levels are out of range. Advised to continue maintaining a log. . Discussed and offered referrals for available Diabetes education and nutrition classes. Declined need for additional resources. His A1C is currently not at goal. He plans to consider low carb snack options and anticipates improvements with glycemic control. . Discussed importance of completing recommended DM preventive care. Reports no longer being followed by Podiatry but continues to complete recommended foot care. Scheduled for an annual eye exam in December.   Patient Goals/Self-Care Activities -Self-administer medications as prescribed -Attend all scheduled provider appointments -Monitor blood glucose levels consistently and utilize recommended interventions -Adhere to prescribed ADA/carb modified -Notify provider or care management team with questions and new concerns as needed   PLAN Will follow-up next month.     Benjamin Conley given information about Chronic Care Management services including:  1. CCM service includes personalized support from designated clinical staff supervised by his physician, including  individualized plan of care and coordination with other care providers 2. 24/7 contact phone numbers for assistance for urgent and routine care needs. 3. Service will only be billed when office clinical staff spend 20 minutes or more in a month to coordinate care. 4. Only one practitioner may furnish and bill the service in a calendar month. 5. The patient may stop CCM services at any time (effective at the end of the month) by phone call to the office staff. 6. The patient will be responsible for cost sharing (co-pay) of up to 20% of the service fee (after annual deductible is met).  Patient agreed to services and verbal consent obtained.     PLAN A member of the care management team will follow up with Mr. CKohlmannnext month.     [facility] ([phone]

## 2020-07-11 ENCOUNTER — Telehealth: Payer: Medicare HMO

## 2020-07-12 NOTE — Progress Notes (Signed)
07/13/2020 1:43 PM   Benjamin Conley [birth year] [id number]  Referring provider: [name], NP No address on file  Chief Complaint  Patient presents with  . Benign Prostatic Hypertrophy   Urological history: 1. ED -contributing factors of age, BPH, COPD, DM, HTN, HLD and smoking -SHIM 7 -managed with sildenafil 100 mg, on-demand-dosing  2. BPH with LU TS -PSA 2.21 in 10/2019 -cysto 01/2020 prominent lateral lobe enlargement of prostate and moderate bladder neck elevation -I PSS 10/1 -PVR 37 mL -managed with tamsulosin 0.4 mg daily   3. Ejaculatory disorder -retrograde ejaculation secondary to BPH and tamsulosin   HPI: Benjamin Conley is a 55 y.o. male who presents today for 6 month follow up.  Patient still having spontaneous erections.   He denies any pain or curvature with erections.  He can have satisfactory erections with sildenafil, but he is not currently in a relationship.    SHIM    Row Name 07/13/20 1305         SHIM: Over the last 6 months:   How do you rate your confidence that you could get and keep an erection? Very Low     When you had erections with sexual stimulation, how often were your erections hard enough for penetration (entering your partner)? A Few Times (much less than half the time)     During sexual intercourse, how often were you able to maintain your erection after you had penetrated (entered) your partner? Almost Never or Never     During sexual intercourse, how difficult was it to maintain your erection to completion of intercourse? Extremely Difficult     When you attempted sexual intercourse, how often was it satisfactory for you? A Few Times (much less than half the time)           SHIM Total Score   SHIM 7            Score: 1-7 Severe ED 8-11 Moderate ED 12-16 Mild-Moderate ED 17-21 Mild ED 22-25 No ED  He has post-void dribbling that results in a few drops.  Patient denies any modifying or aggravating  factors.  Patient denies any gross hematuria, dysuria or suprapubic/flank pain.  Patient denies any fevers, chills, nausea or vomiting.    IPSS    Row Name 07/13/20 1300         International Prostate Symptom Score   How often have you had the sensation of not emptying your bladder? Less than 1 in 5     How often have you had to urinate less than every two hours? Less than half the time     How often have you found you stopped and started again several times when you urinated? Less than half the time     How often have you found it difficult to postpone urination? Less than half the time     How often have you had a weak urinary stream? Less than 1 in 5 times     How often have you had to strain to start urination? Less than 1 in 5 times     How many times did you typically get up at night to urinate? 1 Time     Total IPSS Score 10           Quality of Life due to urinary symptoms   If you were to spend the rest of your life with your urinary condition just the way it is now how would  you feel about that? Pleased            Score:  1-7 Mild 8-19 Moderate 20-35 Severe  PMH: Past Medical History:  Diagnosis Date  . Acute posthemorrhagic anemia   . Complication of anesthesia    c/o difficulty breathing after anesthesia  . COPD (chronic obstructive pulmonary disease) ([place])   . Diabetes mellitus without complication ([place])   . Hyperlipemia   . Melena 08/21/2019  . Paroxysmal supraventricular tachycardia ([place]) 08/19/2019  . Postural dizziness with presyncope 08/21/2019  . 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 Tobacco abuse   . Upper GI bleed 08/21/2019    Surgical History: Past Surgical History:  Procedure Laterality Date  . BACK SURGERY     lumbar  . COLONOSCOPY WITH PROPOFOL N/A 08/24/2019   Procedure: COLONOSCOPY WITH PROPOFOL;  Surgeon: [name], MD;  Location: [facility] ENDOSCOPY;  Service: Endoscopy;  Laterality: N/A;  . ESOPHAGOGASTRODUODENOSCOPY (EGD) WITH PROPOFOL N/A  08/24/2019   Procedure: ESOPHAGOGASTRODUODENOSCOPY (EGD) WITH PROPOFOL;  Surgeon: [name], MD;  Location: [facility] ENDOSCOPY;  Service: Endoscopy;  Laterality: N/A;  . STENT PLACEMENT VASCULAR ([place] [facility])  07/2019   stenosis of distal splenic artery and stent placed  . VISCERAL ANGIOGRAPHY N/A 08/06/2019   Procedure: VISCERAL ANGIOGRAPHY;  Surgeon: [name], MD;  Location: [facility] CV LAB;  Service: Cardiovascular;  Laterality: N/A;    Home Medications:  Allergies as of 07/13/2020      Reactions   Wasp Venom Anaphylaxis   Wasp Venom Protein Anaphylaxis   Coffee Flavor Nausea And Vomiting   Patient states coffee gives him nausea and stomach cramps   Onion Nausea And Vomiting      Medication List       Accurate as of Jul 13, 2020  1:43 PM. If you have any questions, ask your nurse or doctor.        albuterol 108 (90 Base) MCG/ACT inhaler Commonly known as: VENTOLIN HFA Inhale 2 puffs into the lungs every 6 (six) hours as needed for wheezing or shortness of breath.   aspirin 81 MG EC tablet Take 1 tablet (81 mg total) by mouth daily.   atorvastatin 40 MG tablet Commonly known as: LIPITOR Take 1 tablet (40 mg total) by mouth daily.   blood glucose meter kit and supplies Dispense based on patient and insurance preference. Use up to four times daily as directed. (FOR ICD-10 E10.9, E11.9).   clopidogrel 75 MG tablet Commonly known as: PLAVIX Take 1 tablet (75 mg total) by mouth daily.   glucose blood test strip 1 each by Other route in the morning and at bedtime. Use to test blood sugar twice daily.   insulin glargine 100 UNIT/ML Solostar Pen Commonly known as: LANTUS Inject 30 Units into the skin at bedtime.   nicotine 21 mg/24hr patch Commonly known as: NICODERM CQ - dosed in mg/24 hours Place 1 patch (21 mg total) onto the skin daily.   nystatin-triamcinolone ointment Commonly known as: MYCOLOG Apply 1 application topically 2 (two) times daily.   Ozempic  (0.25 or 0.5 MG/DOSE) 2 MG/1.5ML Sopn Generic drug: Semaglutide(0.25 or 0.5MG/DOS) Inject 0.5 mg into the skin once a week.   sildenafil 100 MG tablet Commonly known as: VIAGRA Take 1 tablet (100 mg total) by mouth daily as needed for erectile dysfunction.   Stiolto Respimat 2.5-2.5 MCG/ACT Aers Generic drug: Tiotropium Bromide-Olodaterol Inhale 2 puffs into the lungs daily.   tamsulosin 0.4 MG Caps capsule Commonly known as: FLOMAX Take 1  capsule (0.4 mg total) by mouth daily.   Vitron-C 65-125 MG Tabs Generic drug: Iron-Vitamin C Take 1 tablet by mouth daily.       Allergies:  Allergies  Allergen Reactions  . Wasp Venom Anaphylaxis  . Wasp Venom Protein Anaphylaxis  . Coffee Flavor Nausea And Vomiting    Patient states coffee gives him nausea and stomach cramps  . Onion Nausea And Vomiting    Family History: Family History  Problem Relation Age of Onset  . Diabetes Mellitus II Mother   . Diabetes Mellitus II Brother   . Hyperlipidemia Brother   . Tuberculosis Father   . Heart disease Father   . Cancer Maternal Aunt   . Colon cancer Maternal Uncle   . Diabetes Sister   . Diabetes Sister   . Diabetes Sister   . Diabetes Sister   . Prostate cancer Neg Hx     Social History:  reports that he quit smoking about 5 months ago. His smoking use included cigarettes. He has a 48.00 pack-year smoking history. He has never used smokeless tobacco. He reports that he does not drink alcohol and does not use drugs.   Physical Exam: BP 95/65   Pulse (!) 105   Ht 5' 4"  (1.626 m)   Wt 193 lb (87.5 kg)   BMI 33.13 kg/m   Constitutional:  Well nourished. Alert and oriented, No acute distress. HEENT: [place] AT, mask in place.  Trachea midline Cardiovascular: No clubbing, cyanosis, or edema. Respiratory: Normal respiratory effort, no increased work of breathing. GU: No CVA tenderness.  No bladder fullness or masses.  Patient with uncircumcised phallus.  Foreskin easily retracted   Urethral meatus is patent.  No penile discharge. No penile lesions or rashes. Scrotum without lesions, cysts, rashes and/or edema.  Testicles are located scrotally bilaterally. No masses are appreciated in the testicles. Left and right epididymis are normal. Rectal: Patient with  normal sphincter tone. Anus and perineum without scarring or rashes. No rectal masses are appreciated. Prostate is approximately 50 +grams, could only palpate the apex and midportion of gland, no nodules are appreciated. Seminal vesicles could not be palpated.  Lymph: No inguinal adenopathy. Neurologic: Grossly intact, no focal deficits, moving all 4 extremities. Psychiatric: Normal mood and affect.  Laboratory data: Recent Results (from the past 2160 hour(s))  SARS CORONAVIRUS 2 (TAT 6-24 HRS) Nasopharyngeal Nasopharyngeal Swab     Status: Abnormal   Collection Time: 05/23/20  8:35 AM   Specimen: Nasopharyngeal Swab  Result Value Ref Range   SARS Coronavirus 2 POSITIVE (A) NEGATIVE    Comment: (NOTE) SARS-CoV-2 target nucleic acids are DETECTED.  The SARS-CoV-2 RNA is generally detectable in upper and lower respiratory specimens during the acute phase of infection. Positive results are indicative of the presence of SARS-CoV-2 RNA. Clinical correlation with patient history and other diagnostic information is  necessary to determine patient infection status. Positive results do not rule out bacterial infection or co-infection with other viruses.  The expected result is Negative.  Fact Sheet for Patients: SugarRoll.be  Fact Sheet for Healthcare Providers: https://www.woods-mathews.com/  This test is not yet approved or cleared by the [place] FDA and  has been authorized for detection and/or diagnosis of SARS-CoV-2 by FDA under an Emergency Use Authorization (EUA). This EUA will remain  in effect (meaning this test can be used) for the duration of the COVID-19  declaration under Section 564(b)(1) of the Act, 21 U. S.C. section 360bbb-3(b)(1), unless the authorization is terminated  or revoked sooner.   Performed at [facility], [place] [address] [place], [place] [address]   Vitamin B12     Status: None   Collection Time: 06/30/20 12:51 PM  Result Value Ref Range   Vitamin B-12 220 180 - 914 pg/mL    Comment: (NOTE) This assay is not validated for testing neonatal or myeloproliferative syndrome specimens for Vitamin B12 levels. Performed at [facility], [place] [address], [place], [place] [address]   Iron and TIBC     Status: Abnormal   Collection Time: 06/30/20 12:51 PM  Result Value Ref Range   Iron 64 45 - 182 ug/dL   TIBC 435 250 - 450 ug/dL   Saturation Ratios 15 (L) 17.9 - 39.5 %   UIBC 371 ug/dL    Comment: Performed at [facility], [address] [place], [place] [address]  Ferritin     Status: None   Collection Time: 06/30/20 12:51 PM  Result Value Ref Range   Ferritin 61 24 - 336 ng/mL    Comment: Performed at [facility], [address] [place], [place] [address]  CBC with Differential/Platelet     Status: Abnormal   Collection Time: 06/30/20 12:51 PM  Result Value Ref Range   WBC 8.0 4.0 - 10.5 K/uL   RBC 4.39 4.22 - 5.81 MIL/uL   Hemoglobin 13.1 13.0 - 17.0 g/dL   HCT 39.4 39.0 - 52.0 %   MCV 89.7 80.0 - 100.0 fL   MCH 29.8 26.0 - 34.0 pg   MCHC 33.2 30.0 - 36.0 g/dL   RDW 13.8 11.5 - 15.5 %   Platelets 426 (H) 150 - 400 K/uL   nRBC 0.0 0.0 - 0.2 %   Neutrophils Relative % 41 %   Neutro Abs 3.3 1.7 - 7.7 K/uL   Lymphocytes Relative 47 %   Lymphs Abs 3.7 0.7 - 4.0 K/uL   Monocytes Relative 9 %   Monocytes Absolute 0.7 0.1 - 1.0 K/uL   Eosinophils Relative 2 %   Eosinophils Absolute 0.2 0.0 - 0.5 K/uL   Basophils Relative 1 %   Basophils Absolute 0.1 0.0 - 0.1 K/uL   Immature Granulocytes 0 %   Abs Immature Granulocytes 0.03 0.00 - 0.07 K/uL    Comment: Performed at [facility], [address] [place], [place] [address]  Bladder Scan (Post Void Residual) in office     Status: None   Collection Time: 07/13/20  1:04 PM  Result Value Ref Range   Scan Result 150    Scan Result 37   I have reviewed the labs.  Assessment & Plan:    1.  Erectile dysfunction -patient has recently split from partner, so not as sexually active  2. BPH with LU TS -symptoms are stable -continue tamsulosin 0.4 mg daily  3. Ejaculatory disorder  -explained that his ejaculatory issues were likely the result of his BPH, DM and tamsulosin -he is not sexually active at this time   Zara Council, [place] [address] [place],  [address] [phone]

## 2020-07-13 ENCOUNTER — Other Ambulatory Visit: Payer: Self-pay

## 2020-07-13 ENCOUNTER — Ambulatory Visit (INDEPENDENT_AMBULATORY_CARE_PROVIDER_SITE_OTHER): Payer: Medicare HMO | Admitting: Urology

## 2020-07-13 ENCOUNTER — Telehealth: Payer: Self-pay

## 2020-07-13 ENCOUNTER — Encounter: Payer: Self-pay | Admitting: Urology

## 2020-07-13 VITALS — BP 95/65 | HR 105 | Ht 64.0 in | Wt 193.0 lb

## 2020-07-13 DIAGNOSIS — N401 Enlarged prostate with lower urinary tract symptoms: Secondary | ICD-10-CM | POA: Diagnosis not present

## 2020-07-13 DIAGNOSIS — N5319 Other ejaculatory dysfunction: Secondary | ICD-10-CM | POA: Diagnosis not present

## 2020-07-13 DIAGNOSIS — E1169 Type 2 diabetes mellitus with other specified complication: Secondary | ICD-10-CM

## 2020-07-13 DIAGNOSIS — N521 Erectile dysfunction due to diseases classified elsewhere: Secondary | ICD-10-CM | POA: Diagnosis not present

## 2020-07-13 DIAGNOSIS — N3943 Post-void dribbling: Secondary | ICD-10-CM | POA: Diagnosis not present

## 2020-07-13 LAB — BLADDER SCAN AMB NON-IMAGING
Scan Result: 150
Scan Result: 37

## 2020-07-13 NOTE — Chronic Care Management (AMB) (Signed)
  Chronic Care Management   Note  07/13/2020 Name: Benjamin Conley MRN: [id number] DOB: 12-Aug-1965  Benjamin Conley is a 55 y.o. year old male who is a primary care patient of Flinchum, Kelby Aline, [name]. Benjamin Conley is currently enrolled in care management services. An additional referral for Pharm D was placed.   Follow up plan: Telephone appointment with care management team member scheduled for:10/16/2020  Noreene Larsson, [place], [place], [place] [address] Direct Dial: [phone] [email or web address]

## 2020-07-17 ENCOUNTER — Other Ambulatory Visit (INDEPENDENT_AMBULATORY_CARE_PROVIDER_SITE_OTHER): Payer: Self-pay | Admitting: Nurse Practitioner

## 2020-07-17 DIAGNOSIS — I748 Embolism and thrombosis of other arteries: Secondary | ICD-10-CM

## 2020-07-17 NOTE — Chronic Care Management (AMB) (Signed)
Error. Please disregard

## 2020-07-18 ENCOUNTER — Other Ambulatory Visit: Payer: Self-pay

## 2020-07-18 ENCOUNTER — Ambulatory Visit (INDEPENDENT_AMBULATORY_CARE_PROVIDER_SITE_OTHER): Payer: Medicare HMO

## 2020-07-18 ENCOUNTER — Encounter (INDEPENDENT_AMBULATORY_CARE_PROVIDER_SITE_OTHER): Payer: Self-pay | Admitting: Vascular Surgery

## 2020-07-18 ENCOUNTER — Ambulatory Visit (INDEPENDENT_AMBULATORY_CARE_PROVIDER_SITE_OTHER): Payer: Medicare HMO | Admitting: Vascular Surgery

## 2020-07-18 VITALS — BP 103/65 | HR 104 | Resp 16 | Wt 193.0 lb

## 2020-07-18 DIAGNOSIS — E1140 Type 2 diabetes mellitus with diabetic neuropathy, unspecified: Secondary | ICD-10-CM | POA: Diagnosis not present

## 2020-07-18 DIAGNOSIS — I10 Essential (primary) hypertension: Secondary | ICD-10-CM | POA: Diagnosis not present

## 2020-07-18 DIAGNOSIS — E1165 Type 2 diabetes mellitus with hyperglycemia: Secondary | ICD-10-CM | POA: Diagnosis not present

## 2020-07-18 DIAGNOSIS — I748 Embolism and thrombosis of other arteries: Secondary | ICD-10-CM

## 2020-07-18 DIAGNOSIS — IMO0002 Reserved for concepts with insufficient information to code with codable children: Secondary | ICD-10-CM

## 2020-07-18 NOTE — Progress Notes (Signed)
MRN : [id number]  Benjamin Conley is a 55 y.o. ([birth year]) male who presents with chief complaint of  Chief Complaint  Patient presents with  . Follow-up    Ultrasound follow up  .  History of Present Illness: Patient returns today in follow up of his splenic artery intervention performed about a year ago.  He is doing well.  Denies any plaints today.  No fevers or chills.  No significant abdominal pain. Duplex today shows a splenic artery to have good flow with some mildly elevated velocities in the celiac and SMA.   Current Outpatient Medications  Medication Sig Dispense Refill  . albuterol (VENTOLIN HFA) 108 (90 Base) MCG/ACT inhaler Inhale 2 puffs into the lungs every 6 (six) hours as needed for wheezing or shortness of breath. 6.7 g 3  . aspirin EC 81 MG EC tablet Take 1 tablet (81 mg total) by mouth daily. 90 tablet 1  . atorvastatin (LIPITOR) 40 MG tablet Take 1 tablet (40 mg total) by mouth daily. 90 tablet 1  . blood glucose meter kit and supplies Dispense based on patient and insurance preference. Use up to four times daily as directed. (FOR ICD-10 E10.9, E11.9). 1 each 0  . clopidogrel (PLAVIX) 75 MG tablet Take 1 tablet (75 mg total) by mouth daily. 90 tablet 3  . glucose blood test strip 1 each by Other route in the morning and at bedtime. Use to test blood sugar twice daily. 100 each 3  . insulin glargine (LANTUS) 100 UNIT/ML Solostar Pen Inject 30 Units into the skin at bedtime. 15 mL 3  . Iron-Vitamin C (VITRON-C) 65-125 MG TABS Take 1 tablet by mouth daily. 60 tablet 0  . nicotine (NICODERM CQ - DOSED IN MG/24 HOURS) 21 mg/24hr patch Place 1 patch (21 mg total) onto the skin daily. 28 patch 0  . nystatin-triamcinolone ointment (MYCOLOG) Apply 1 application topically 2 (two) times daily. 60 g 0  . Semaglutide,0.25 or 0.5MG/DOS, (OZEMPIC, 0.25 OR 0.5 MG/DOSE,) 2 MG/1.5ML SOPN Inject 0.5 mg into the skin once a week. 1.5 mL 1  . sildenafil (VIAGRA) 100 MG tablet Take 1  tablet (100 mg total) by mouth daily as needed for erectile dysfunction. 30 tablet 0  . tamsulosin (FLOMAX) 0.4 MG CAPS capsule Take 1 capsule (0.4 mg total) by mouth daily. 90 capsule 0  . Tiotropium Bromide-Olodaterol (STIOLTO RESPIMAT) 2.5-2.5 MCG/ACT AERS Inhale 2 puffs into the lungs daily. 1 each 0   No current facility-administered medications for this visit.    Past Medical History:  Diagnosis Date  . Acute posthemorrhagic anemia   . Complication of anesthesia    c/o difficulty breathing after anesthesia  . COPD (chronic obstructive pulmonary disease) ([place])   . Diabetes mellitus without complication ([place])   . Hyperlipemia   . Melena 08/21/2019  . Paroxysmal supraventricular tachycardia ([place]) 08/19/2019  . Postural dizziness with presyncope 08/21/2019  . 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 Tobacco abuse   . Upper GI bleed 08/21/2019    Past Surgical History:  Procedure Laterality Date  . BACK SURGERY     lumbar  . COLONOSCOPY WITH PROPOFOL N/A 08/24/2019   Procedure: COLONOSCOPY WITH PROPOFOL;  Surgeon: [name], MD;  Location: [facility] ENDOSCOPY;  Service: Endoscopy;  Laterality: N/A;  . ESOPHAGOGASTRODUODENOSCOPY (EGD) WITH PROPOFOL N/A 08/24/2019   Procedure: ESOPHAGOGASTRODUODENOSCOPY (EGD) WITH PROPOFOL;  Surgeon: [name], MD;  Location: [facility] ENDOSCOPY;  Service: Endoscopy;  Laterality: N/A;  . STENT PLACEMENT VASCULAR ([place] [facility])  07/2019   stenosis of distal splenic artery and stent placed  . VISCERAL ANGIOGRAPHY N/A 08/06/2019   Procedure: VISCERAL ANGIOGRAPHY;  Surgeon: [name], MD;  Location: [facility];  Service: Cardiovascular;  Laterality: N/A;     Social History   Tobacco Use  . Smoking status: Former Smoker    Packs/day: 1.00    Years: 48.00    Pack years: 48.00    Types: Cigarettes    Quit date: 02/12/2020    Years since quitting: 0.4  . Smokeless tobacco: Never Used  Vaping Use  . Vaping Use: Never used  Substance Use Topics  .  Alcohol use: No    Comment: Never been a problem  . Drug use: No      Family History  Problem Relation Age of Onset  . Diabetes Mellitus II Mother   . Diabetes Mellitus II Brother   . Hyperlipidemia Brother   . Tuberculosis Father   . Heart disease Father   . Cancer Maternal Aunt   . Colon cancer Maternal Uncle   . Diabetes Sister   . Diabetes Sister   . Diabetes Sister   . Diabetes Sister   . Prostate cancer Neg Hx     Allergies  Allergen Reactions  . Wasp Venom Anaphylaxis  . Wasp Venom Protein Anaphylaxis  . Coffee Flavor Nausea And Vomiting    Patient states coffee gives him nausea and stomach cramps  . Onion Nausea And Vomiting    REVIEW OF SYSTEMS (Negative unless checked)  Constitutional: _0 ?Weight loss  _1 ?Fever  _2 ?Chills Cardiac: _3 ?Chest pain   _4 ?Chest pressure   _5 ?Palpitations   _6 ?Shortness of breath when laying flat   _7 ?Shortness of breath at rest   _8 ?Shortness of breath with exertion. Vascular:  _9 ?Pain in legs with walking   _10 ?Pain in legs at rest   _11 ?Pain in legs when laying flat   _12 ?Claudication   _13 ?Pain in feet when walking  _14 ?Pain in feet at rest  _15 ?Pain in feet when laying flat   _16 ?History of DVT   _17 ?Phlebitis   _18 ?Swelling in legs   _19 ?Varicose veins   _20 ?Non-healing ulcers Pulmonary:   _21 ?Uses home oxygen   _22 ?Productive cough   _23 ?Hemoptysis   _24 ?Wheeze  _25 ?COPD   _26 ?Asthma Neurologic:  _27 ?Dizziness  _28 ?Blackouts   _29 ?Seizures   _30 ?History of stroke   _31 ?History of TIA  _32 ?Aphasia   _33 ?Temporary blindness   _34 ?Dysphagia   _35 ?Weakness or numbness in arms   _36 ?Weakness or numbness in legs Musculoskeletal:  _37 ?Arthritis   _38 ?Joint swelling   _39 ?Joint pain   _40 ?Low back pain Hematologic:  _41 ?Easy bruising  _42 ?Easy bleeding   _43 ?Hypercoagulable state   _44 ?Anemic   Gastrointestinal:  _45 ?Blood in stool   _46 ?Vomiting blood  _47 ?Gastroesophageal reflux/heartburn   _48 ?Abdominal pain Genitourinary:  _49 ?Chronic kidney disease   _50 ?Difficult urination   _51 ?Frequent urination  _52 ?Burning with urination   _53 ?Hematuria Skin:  _54 ?Rashes   _55 ?Ulcers   _56 ?Wounds Psychological:  _57 ?History of anxiety   _58 ? History of major depression.  Physical Examination  BP 103/65 (BP Location: Right Arm)   Pulse (!) 104   Resp 16   Wt 193 lb (87.5 kg)   BMI 33.13 kg/m  Gen:  WD/WN, NAD Head: [place]/AT, No temporalis wasting. Ear/Nose/Throat: Hearing grossly intact, nares w/o erythema or drainage Eyes: Conjunctiva clear. Sclera non-icteric Neck: Supple.  Trachea midline Pulmonary:  Good air movement, no use of accessory muscles.  Cardiac: tachycardic Vascular:  Vessel Right Left  Radial  Palpable Palpable                          PT Palpable Palpable  DP Palpable Palpable   Gastrointestinal: soft, non-tender/non-distended. No guarding/reflex.  Musculoskeletal: M/S 5/5 throughout.  No deformity or atrophy. No edema. Neurologic: Sensation grossly intact in extremities.  Symmetrical.  Speech is fluent.  Psychiatric: Judgment intact, Mood & affect appropriate for pt's clinical situation. Dermatologic: No rashes or ulcers noted.  No cellulitis or open wounds.       Labs Recent Results (from the past 2160 hour(s))  SARS CORONAVIRUS 2 (TAT 6-24 HRS) Nasopharyngeal Nasopharyngeal Swab     Status: Abnormal   Collection Time: 05/23/20  8:35 AM   Specimen: Nasopharyngeal Swab  Result Value Ref Range   SARS Coronavirus 2 POSITIVE (A) NEGATIVE    Comment: (NOTE) SARS-CoV-2 target nucleic acids are DETECTED.  The SARS-CoV-2 RNA is generally detectable in upper and lower respiratory specimens during the acute phase of infection. Positive results are indicative of the presence of SARS-CoV-2 RNA. Clinical correlation with patient history and other diagnostic information is  necessary to determine patient infection status. Positive results do not rule out bacterial infection or co-infection with other viruses.  The expected result is  Negative.  Fact Sheet for Patients: SugarRoll.be  Fact Sheet for Healthcare Providers: https://www.woods-mathews.com/  This test is not yet approved or cleared by the [place] FDA and  has been authorized for detection and/or diagnosis of SARS-CoV-2 by FDA under an Emergency Use Authorization (EUA). This EUA will remain  in effect (meaning this test can be used) for the duration of the COVID-19 declaration under Section 564(b)(1) of the Act, 21 U. S.C. section 360bbb-3(b)(1), unless the authorization is terminated or revoked sooner.   Performed at [facility], [place] [address] [place], [place] [address]   Vitamin B12     Status: None   Collection Time: 06/30/20 12:51 PM  Result Value Ref Range   Vitamin B-12 220 180 - 914 pg/mL    Comment: (NOTE) This assay is not validated for testing neonatal or myeloproliferative syndrome specimens for Vitamin B12 levels. Performed at [facility], [place] [address], [place], [place] [address]   Iron and TIBC     Status: Abnormal   Collection Time: 06/30/20 12:51 PM  Result Value Ref Range   Iron 64 45 - 182 ug/dL   TIBC 435 250 - 450 ug/dL   Saturation Ratios 15 (L) 17.9 - 39.5 %   UIBC 371 ug/dL    Comment: Performed at [facility], [address] [place], [place] [address]  Ferritin     Status: None   Collection Time: 06/30/20 12:51 PM  Result Value Ref Range   Ferritin 61 24 - 336 ng/mL    Comment: Performed at [facility], [address] [place], [place] [address]  CBC with Differential/Platelet     Status: Abnormal   Collection Time: 06/30/20 12:51 PM  Result Value Ref Range   WBC 8.0 4.0 - 10.5 K/uL   RBC 4.39 4.22 - 5.81 MIL/uL   Hemoglobin 13.1 13.0 - 17.0 g/dL   HCT 39.4 39.0 - 52.0 %   MCV 89.7 80.0 - 100.0 fL   MCH 29.8 26.0 - 34.0 pg   MCHC 33.2 30.0 - 36.0 g/dL   RDW 13.8 11.5 - 15.5 %   Platelets 426 (H) 150 - 400 K/uL   nRBC 0.0  0.0  - 0.2 %   Neutrophils Relative % 41 %   Neutro Abs 3.3 1.7 - 7.7 K/uL   Lymphocytes Relative 47 %   Lymphs Abs 3.7 0.7 - 4.0 K/uL   Monocytes Relative 9 %   Monocytes Absolute 0.7 0.1 - 1.0 K/uL   Eosinophils Relative 2 %   Eosinophils Absolute 0.2 0.0 - 0.5 K/uL   Basophils Relative 1 %   Basophils Absolute 0.1 0.0 - 0.1 K/uL   Immature Granulocytes 0 %   Abs Immature Granulocytes 0.03 0.00 - 0.07 K/uL    Comment: Performed at [facility], [address] [place], [place] [address]  Bladder Scan (Post Void Residual) in office     Status: None   Collection Time: 07/13/20  1:04 PM  Result Value Ref Range   Scan Result 150    Scan Result 37     Radiology No results found.  Assessment/Plan Diabetic neuropathy (HCC) blood glucose control important in reducing the progression of atherosclerotic disease. Also, involved in wound healing. On appropriate medications.   Hypertension blood pressure control important in reducing the progression of atherosclerotic disease. On appropriate oral medications.  Thrombosis of splenic artery (HCC) Duplex today shows a splenic artery to have good flow with some mildly elevated velocities in the celiac and SMA.  Doing well clinically.  We can go to a once a year follow-up at this point.   Leotis Pain, MD  07/18/2020 1:15 PM    This note was created with Dragon medical transcription system.  Any errors from dictation are purely unintentional

## 2020-07-26 IMAGING — CT CT HEAD W/O CM
3 series · 16 of 46 positions shown, 19 images · non-contrast
Comparison: Brain MRI 08/20/2019

CLINICAL DATA: Shortness of breath and chest pain for 3 days.
Headaches

EXAM:
CT HEAD WITHOUT CONTRAST
TECHNIQUE: Contiguous axial images were obtained from the base of the skull
through the vertex without intravenous contrast.

[Series 2: head wo · axial · 0.39mm/px · z∈[-81,+39]mm · 10 of 29 slices shown, 13 images]
[im 3/29  brain]
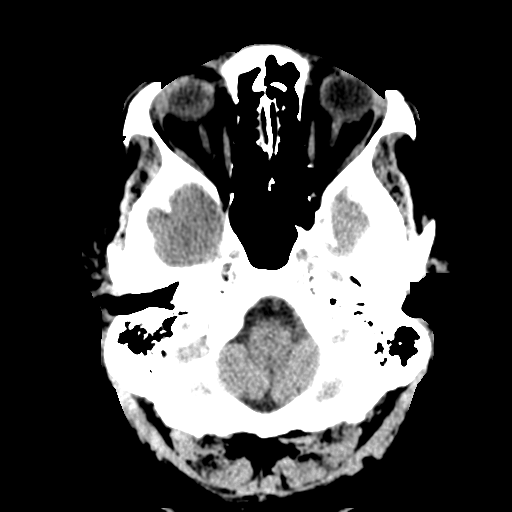
[im 3/29  bone]
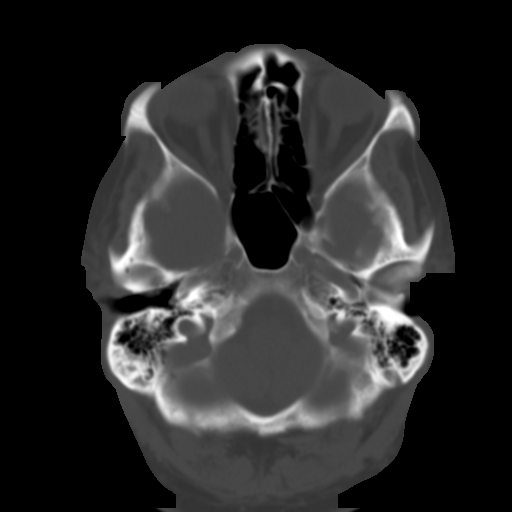
[im 6/29  brain]
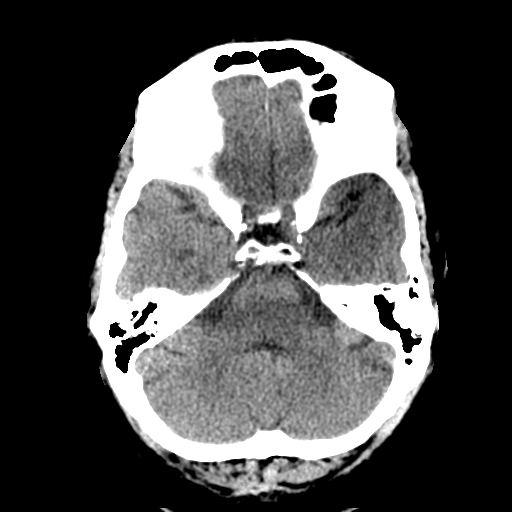
[im 8/29  brain]
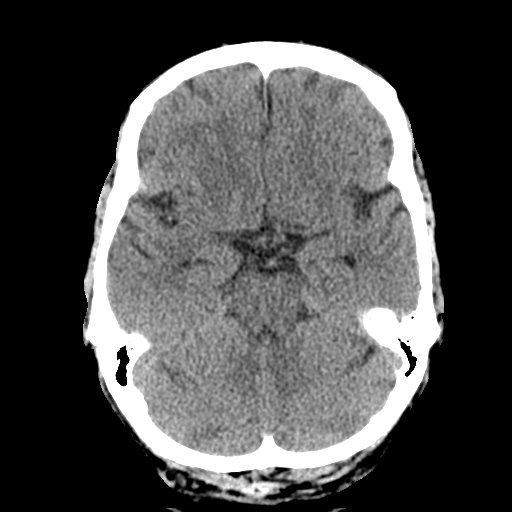
[im 11/29  brain]
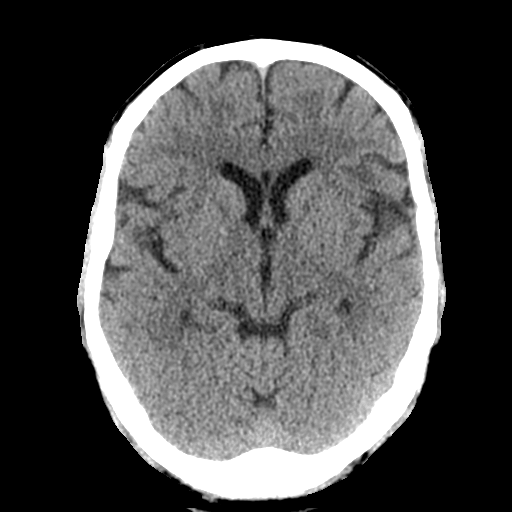
[im 14/29  brain]
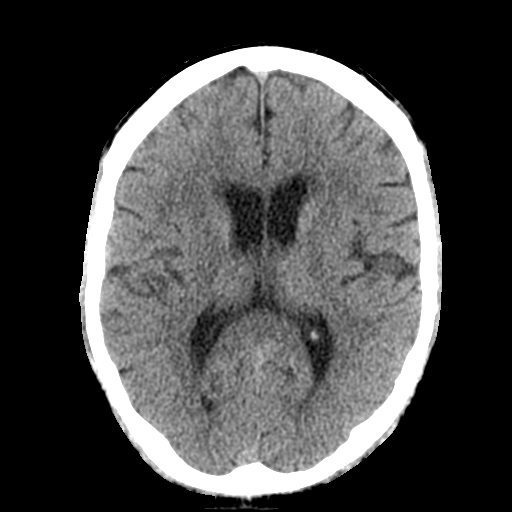
[im 14/29  bone]
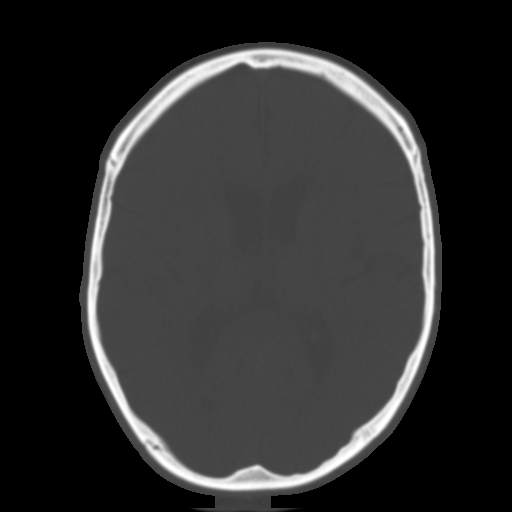
[im 16/29  brain]
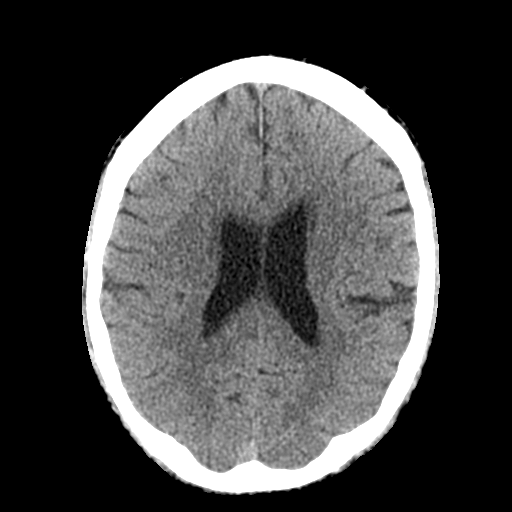
[im 19/29  brain]
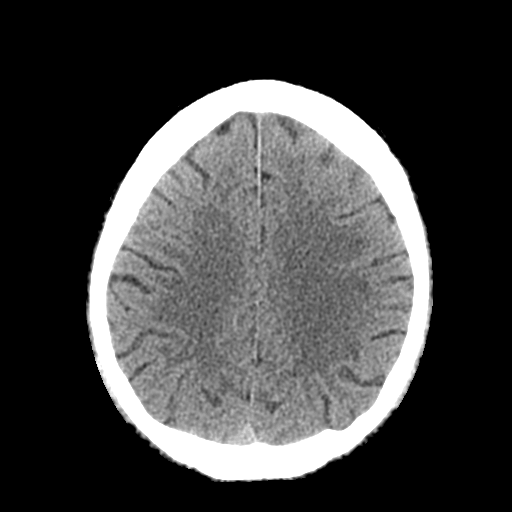
[im 22/29  brain]
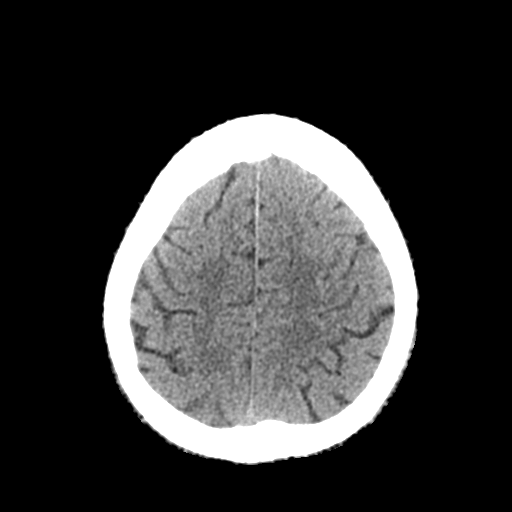
[im 24/29  brain]
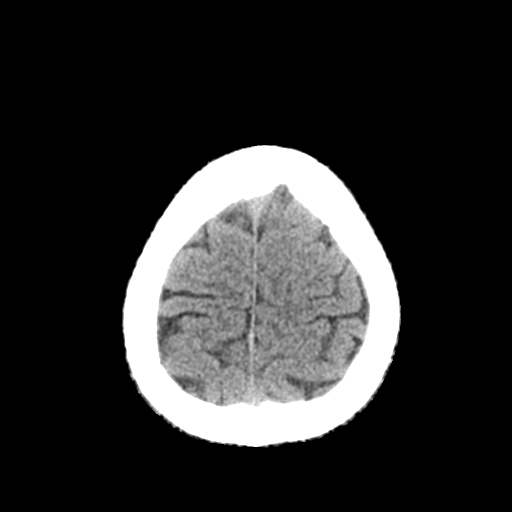
[im 24/29  bone]
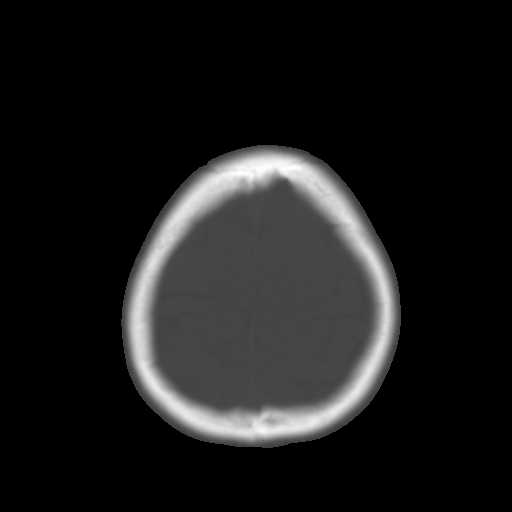
[im 27/29  brain]
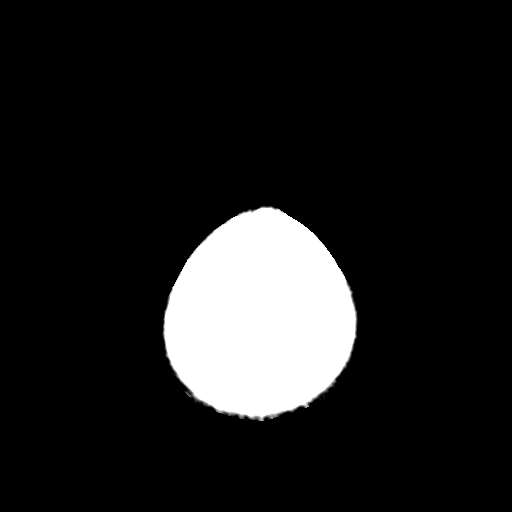

[Series 4: coronal soft tissue · coronal · 0.30mm/px · 3 of 60 slices shown]
[im 20/60  brain]
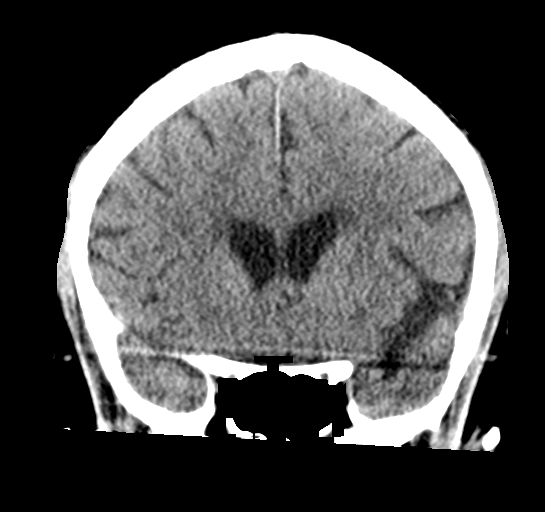
[im 27/60  brain]
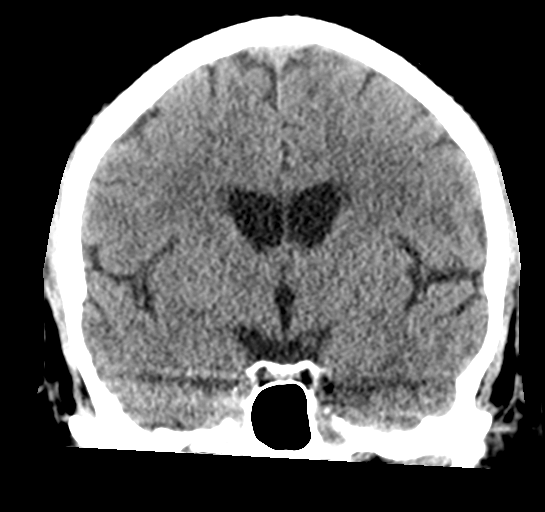
[im 33/60  brain]
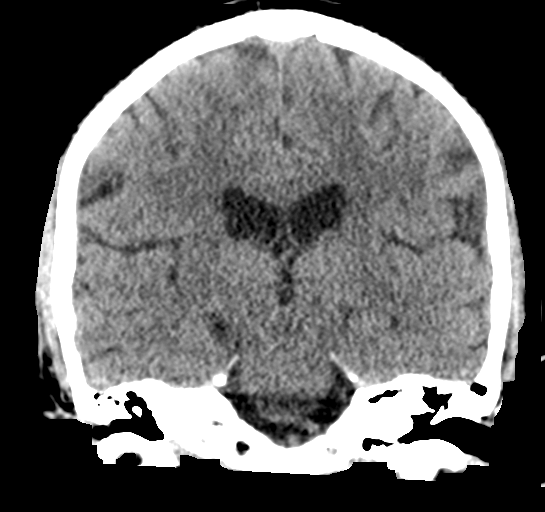

[Series 5: sagittal soft tissue · sagittal · 0.31mm/px · 3 of 53 slices shown]
[im 18/53  brain]
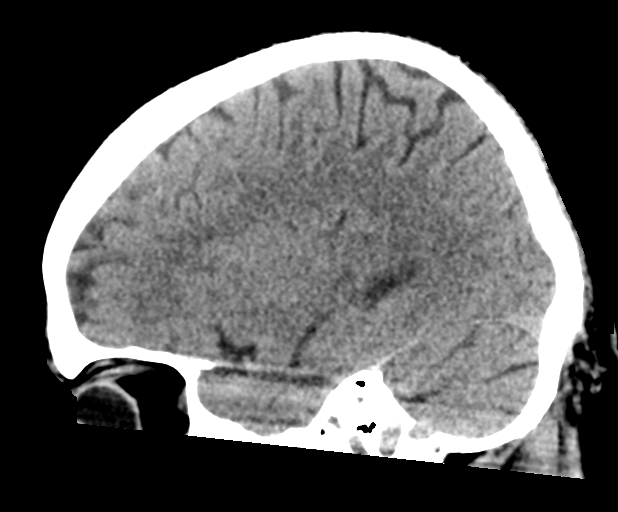
[im 27/53  brain]
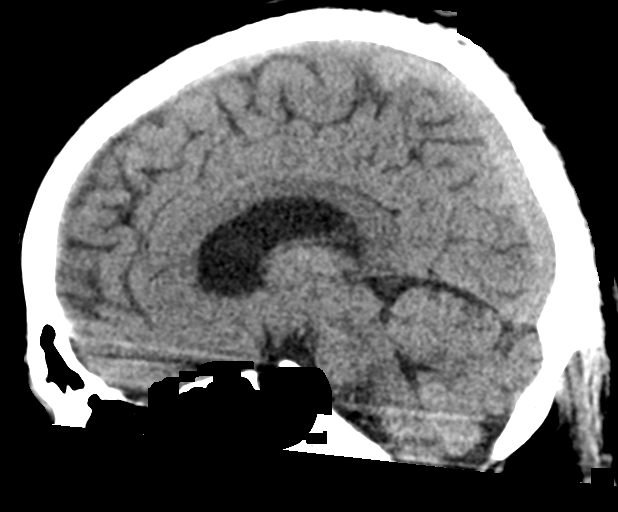
[im 35/53  brain]
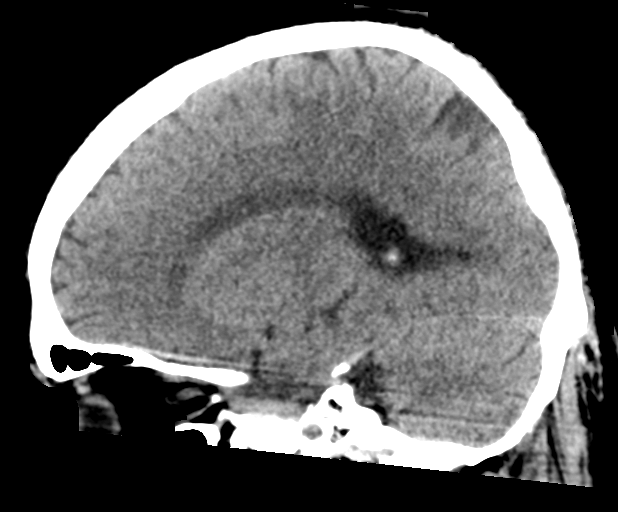

[16 of 46 positions shown; findings below may reference images not displayed]

FINDINGS: Brain: No evidence of acute infarction, hemorrhage, hydrocephalus,
extra-axial collection or mass lesion/mass effect. Mild
periventricular chronic white matter disease.

Vascular: No hyperdense vessel.

Skull: Normal. Negative for fracture or focal lesion.

Sinuses/Orbits: Negative
IMPRESSION: No acute finding or change from a recent brain MRI.

## 2020-07-31 NOTE — Patient Instructions (Signed)
Thank you for allowing the Chronic Care Management team to participate in your care. It was a pleasure speaking with you. Please feel free to contact me with questions.  Goals Addressed: Patient Care Plan: Diabetes Type 2 (Adult)    Problem Identified: Disease Progression (Diabetes, Type 2)     Long-Range Goal: Disease Progression Prevented or Minimized   Start Date: 07/10/2020  Expected End Date: 11/07/2020  Priority: High  Note:   Objective:  Lab Results  Component Value Date   HGBA1C 8.0 (H) 01/21/2020 .   Lab Results  Component Value Date   CREATININE 0.66 (L) 01/21/2020   CREATININE 0.70 10/21/2019   CREATININE 0.64 09/23/2019 .   [name] No results found for: EGFR   Interventions:  . Collaboration with Benjamin Conley, Benjamin Aline, [name] regarding development and update of comprehensive plan of care as evidenced by provider attestation and co-signature . Inter-disciplinary care team collaboration (see longitudinal plan of care) . Reviewed medications. Reports taking medications as prescribed. Denies concerns regarding medication management. Encouraged to take medications as prescribed and notify provider if unable to tolerate regimen. Encouraged to keep the CCM Pharmacist updated of concerns regarding prescription cost.  . Discussed importance of consistently monitoring blood glucose and reviewed home readings. Discussed s/sx of hypoglycemia and hyperglycemia along with recommended interventions. Reports readings have been elevated over the past few weeks. Reports a fasting range from 163 to 221m/dl. Attributes the elevations to snacking. He is attempting to comply with a diabetic diet but reports he recently quit smoking and tends to snack frequently. Reports often reaching for soft drinks and cookie bars. Discussed low carb snack options to assist with maintaining optimal glycemic control. Advised to monitor blood glucose consistently and recheck when levels are out of range. Advised to continue  maintaining a log. . Discussed and offered referrals for available Diabetes education and nutrition classes. Declined need for additional resources. His A1C is currently not at goal. He plans to consider low carb snack options and anticipates improvements with glycemic control. . Discussed importance of completing recommended DM preventive care. Reports no longer being followed by Podiatry but continues to complete recommended foot care. Scheduled for an annual eye exam in December.   Patient Goals/Self-Care Activities -Self-administer medications as prescribed -Attend all scheduled provider appointments -Monitor blood glucose levels consistently and utilize recommended interventions -Adhere to prescribed ADA/carb modified -Notify provider or care management team with questions and new concerns as needed   PLAN Will follow-up next month.     Mr. CWalderwas given information about Chronic Care Management services including:  1. CCM service includes personalized support from designated clinical staff supervised by his physician, including individualized plan of care and coordination with other care providers 2. 24/7 contact phone numbers for assistance for urgent and routine care needs. 3. Service will only be billed when office clinical staff spend 20 minutes or more in a month to coordinate care. 4. Only one practitioner may furnish and bill the service in a calendar month. 5. The patient may stop CCM services at any time (effective at the end of the month) by phone call to the office staff. 6. The patient will be responsible for cost sharing (co-pay) of up to 20% of the service fee (after annual deductible is met).  Patient agreed to services and verbal consent obtained.      Mr. CMinshallverbalized understanding of the information discussed during the telephonic outreach today. Declined need for mailed/printed instructions. A member of the care management  team will follow up next  month.     [facility] [phone]

## 2020-08-08 ENCOUNTER — Encounter: Payer: Self-pay | Admitting: Adult Health

## 2020-08-10 ENCOUNTER — Other Ambulatory Visit: Payer: Self-pay

## 2020-08-10 MED ORDER — INSULIN PEN NEEDLE 32G X 4 MM MISC: 2 refills | Status: DC

## 2020-08-10 MED ORDER — PEN NEEDLES 32G X 4 MM MISC: 3 refills | Status: DC

## 2020-09-18 ENCOUNTER — Ambulatory Visit: Payer: Self-pay | Admitting: Adult Health

## 2020-09-26 ENCOUNTER — Ambulatory Visit (INDEPENDENT_AMBULATORY_CARE_PROVIDER_SITE_OTHER): Payer: Medicare HMO

## 2020-09-26 DIAGNOSIS — E1140 Type 2 diabetes mellitus with diabetic neuropathy, unspecified: Secondary | ICD-10-CM | POA: Diagnosis not present

## 2020-09-26 DIAGNOSIS — IMO0002 Reserved for concepts with insufficient information to code with codable children: Secondary | ICD-10-CM

## 2020-09-26 DIAGNOSIS — E1165 Type 2 diabetes mellitus with hyperglycemia: Secondary | ICD-10-CM

## 2020-09-26 NOTE — Chronic Care Management (AMB) (Signed)
Chronic Care Management   CCM RN Visit Note  09/26/2020 Name: Benjamin Conley MRN: [id number] DOB: 1965/05/08  Subjective: Benjamin Conley is a 55 y.o. year old male who is a primary care patient of Flinchum, Kelby Aline, [name]. The care management team was consulted for assistance with disease management and care coordination needs.    Engaged with patient by telephone for follow up visit in response to provider referral for case management and/or care coordination services.   Consent to Services:  The patient was given information about Chronic Care Management services, agreed to services, and gave verbal consent prior to initiation of services.  Please see initial visit note for detailed documentation.    Assessment: Review of patient past medical history, allergies, medications, health status, including review of consultants reports, laboratory and other test data, was performed as part of comprehensive evaluation and provision of chronic care management services.   SDOH (Social Determinants of Health) assessments and interventions performed:    CCM Care Plan  Allergies  Allergen Reactions   Wasp Venom Anaphylaxis   Wasp Venom Protein Anaphylaxis   Coffee Flavor Nausea And Vomiting    Patient states coffee gives him nausea and stomach cramps   Onion Nausea And Vomiting    Outpatient Encounter Medications as of 09/26/2020  Medication Sig   albuterol (VENTOLIN HFA) 108 (90 Base) MCG/ACT inhaler Inhale 2 puffs into the lungs every 6 (six) hours as needed for wheezing or shortness of breath.   aspirin EC 81 MG EC tablet Take 1 tablet (81 mg total) by mouth daily.   atorvastatin (LIPITOR) 40 MG tablet Take 1 tablet (40 mg total) by mouth daily.   blood glucose meter kit and supplies Dispense based on patient and insurance preference. Use up to four times daily as directed. (FOR ICD-10 E10.9, E11.9).   clopidogrel (PLAVIX) 75 MG tablet Take 1 tablet (75 mg total) by mouth daily.    glucose blood test strip 1 each by Other route in the morning and at bedtime. Use to test blood sugar twice daily.   insulin glargine (LANTUS) 100 UNIT/ML Solostar Pen Inject 30 Units into the skin at bedtime.   Insulin Pen Needle (PEN NEEDLES) 32G X 4 MM MISC Used to give daily insulin injections.   Insulin Pen Needle 32G X 4 MM MISC Use as directed to administer insulin   Iron-Vitamin C (VITRON-C) 65-125 MG TABS Take 1 tablet by mouth daily.   nicotine (NICODERM CQ - DOSED IN MG/24 HOURS) 21 mg/24hr patch Place 1 patch (21 mg total) onto the skin daily.   nystatin-triamcinolone ointment (MYCOLOG) Apply 1 application topically 2 (two) times daily.   Semaglutide,0.25 or 0.5MG/DOS, (OZEMPIC, 0.25 OR 0.5 MG/DOSE,) 2 MG/1.5ML SOPN Inject 0.5 mg into the skin once a week.   sildenafil (VIAGRA) 100 MG tablet Take 1 tablet (100 mg total) by mouth daily as needed for erectile dysfunction.   tamsulosin (FLOMAX) 0.4 MG CAPS capsule Take 1 capsule (0.4 mg total) by mouth daily.   Tiotropium Bromide-Olodaterol (STIOLTO RESPIMAT) 2.5-2.5 MCG/ACT AERS Inhale 2 puffs into the lungs daily.   No facility-administered encounter medications on file as of 09/26/2020.    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   Abnormal brain MRI 09/22/2019   Difficulty sleeping 09/22/2019   Tinnitus of both ears 09/22/2019   Pain due to onychomycosis of toenails of both feet 09/06/2019   DM type 2, uncontrolled, with neuropathy ([place]) 09/06/2019   Coagulation disorder ([place])  09/06/2019   Iron deficiency anemia due to chronic blood loss 08/29/2019   Thrombocytosis 08/24/2019   Angiodysplasia of intestinal tract    Black stool 08/22/2019   Presence of arterial stent - splenic artery 08/21/2019   Dizziness 08/19/2019   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   COPD with chronic bronchitis ([facility]) 08/01/2019   Tobacco abuse 08/01/2019   Diabetes mellitus without complication ([place])    Hyperlipidemia    Impingement syndrome of shoulder region 03/30/2018   Hypertension 11/10/2013    Conditions to be addressed/monitored:DMII  Patient Care Plan: Diabetes Type 2 (Adult)     Problem Identified: Disease Progression (Diabetes, Type 2)      Long-Range Goal: Disease Progression Prevented or Minimized   Start Date: 07/10/2020  Expected End Date: 11/07/2020  Priority: High  Objective:  Lab Results  Component Value Date   HGBA1C 8.0 (H) 01/21/2020   Lab Results  Component Value Date   CREATININE 0.66 (L) 01/21/2020   CREATININE 0.70 10/21/2019   CREATININE 0.64 09/23/2019   No results found for: EGFR  Current Barriers:  Chronic Disease Management support and educational needs related to Diabetes self-management.  Case Manager Clinical Goal(s):  Over the next 120 days, patient will demonstrate improved adherence to prescribed treatment plan for diabetes self care/management as evidenced by: taking medications as prescribed, daily monitoring and recording CBG and adherence to ADA/ carb modified diet.  Interventions:  Collaboration with Flinchum, Kelby Aline, [name] regarding development and update of comprehensive plan of care as evidenced by provider attestation and co-signature Inter-disciplinary care team collaboration (see longitudinal plan of care) Reviewed medications. Reports taking medications as prescribed. Denies concerns regarding medication management. Tolerating prescribed regimen. Reviewed blood glucose levels. Reports monitoring consistently as advised. Discussed  s/sx of hypoglycemia and hyperglycemia along with recommended interventions. Reports readings have improved but continue to fluctuate. Recalls a reading of 300 on Saturday but reports most readings have ranged from the 120's to 200's. Reports most recent reading was 155 mg/dl last night after his meal. Denies low readings. Thoroughly discussed interventions and indications for rechecking blood glucose levels. Discussed indications for notifying a provider and seeking medical attention for consistently elevated levels. Advised to continue monitoring and maintain a log.   Patient Goals/Self-Care Activities Self-administer medications as prescribed Attend all scheduled provider appointments Monitor blood glucose levels consistently and utilize recommended interventions Adhere to prescribed ADA/carb modified Notify provider or care management team with questions and new concerns as needed    Follow Up Will follow up in two months.       PLAN A member of the care management team will follow up with Benjamin Conley in two months.   [facility] [phone]

## 2020-09-26 NOTE — Patient Instructions (Addendum)
Thank you for allowing the Chronic Care Management team to participate in your care.   Goal Addressed: Patient Care Plan: Diabetes Type 2 (Adult)     Problem Identified: Disease Progression (Diabetes, Type 2)      Long-Range Goal: Disease Progression Prevented or Minimized   Start Date: 07/10/2020  Expected End Date: 11/07/2020  Priority: High  Objective:  Lab Results  Component Value Date   HGBA1C 8.0 (H) 01/21/2020   Lab Results  Component Value Date   CREATININE 0.66 (L) 01/21/2020   CREATININE 0.70 10/21/2019   CREATININE 0.64 09/23/2019   No results found for: EGFR  Current Barriers:  Chronic Disease Management support and educational needs related to Diabetes self-management.  Case Manager Clinical Goal(s):  Over the next 120 days, patient will demonstrate improved adherence to prescribed treatment plan for diabetes self care/management as evidenced by: taking medications as prescribed, daily monitoring and recording CBG and adherence to ADA/ carb modified diet.  Interventions:  Collaboration with Flinchum, Kelby Aline, [name] regarding development and update of comprehensive plan of care as evidenced by provider attestation and co-signature Inter-disciplinary care team collaboration (see longitudinal plan of care) Reviewed medications. Reports taking medications as prescribed. Denies concerns regarding medication management. Tolerating prescribed regimen. Reviewed blood glucose levels. Reports monitoring consistently as advised. Discussed s/sx of hypoglycemia and hyperglycemia along with recommended interventions. Reports readings have improved but continue to fluctuate. Recalls a reading of 300 on Saturday but reports most readings have ranged from the 120's to 200's. Reports most recent reading was 155 mg/dl last night after his meal. Denies low readings. Thoroughly discussed interventions and indications for rechecking blood glucose levels. Discussed indications for notifying a  provider and seeking medical attention for consistently elevated levels. Advised to continue monitoring and maintain a log.   Patient Goals/Self-Care Activities Self-administer medications as prescribed Attend all scheduled provider appointments Monitor blood glucose levels consistently and utilize recommended interventions Adhere to prescribed ADA/carb modified Notify provider or care management team with questions and new concerns as needed    Follow Up Will follow up in two months.        Benjamin Conley verbalized understanding of the information discussed during the telephonic outreach today. Declined need for mailed/printed instructions. A member of the care management team will follow up with Benjamin Conley in two months.   [facility] [phone]

## 2020-10-10 ENCOUNTER — Other Ambulatory Visit: Payer: Self-pay

## 2020-10-10 ENCOUNTER — Other Ambulatory Visit: Payer: Self-pay | Admitting: Family Medicine

## 2020-10-10 DIAGNOSIS — R002 Palpitations: Secondary | ICD-10-CM

## 2020-10-10 DIAGNOSIS — IMO0002 Reserved for concepts with insufficient information to code with codable children: Secondary | ICD-10-CM

## 2020-10-10 DIAGNOSIS — E1140 Type 2 diabetes mellitus with diabetic neuropathy, unspecified: Secondary | ICD-10-CM

## 2020-10-11 ENCOUNTER — Other Ambulatory Visit: Payer: Self-pay

## 2020-10-11 ENCOUNTER — Ambulatory Visit (INDEPENDENT_AMBULATORY_CARE_PROVIDER_SITE_OTHER): Payer: Medicare HMO

## 2020-10-11 DIAGNOSIS — R002 Palpitations: Secondary | ICD-10-CM | POA: Diagnosis not present

## 2020-10-16 ENCOUNTER — Ambulatory Visit (INDEPENDENT_AMBULATORY_CARE_PROVIDER_SITE_OTHER): Payer: Medicare HMO | Admitting: Pharmacist

## 2020-10-16 DIAGNOSIS — IMO0002 Reserved for concepts with insufficient information to code with codable children: Secondary | ICD-10-CM

## 2020-10-16 DIAGNOSIS — J449 Chronic obstructive pulmonary disease, unspecified: Secondary | ICD-10-CM | POA: Diagnosis not present

## 2020-10-16 DIAGNOSIS — E1165 Type 2 diabetes mellitus with hyperglycemia: Secondary | ICD-10-CM | POA: Diagnosis not present

## 2020-10-16 DIAGNOSIS — E1140 Type 2 diabetes mellitus with diabetic neuropathy, unspecified: Secondary | ICD-10-CM

## 2020-10-16 DIAGNOSIS — E785 Hyperlipidemia, unspecified: Secondary | ICD-10-CM | POA: Diagnosis not present

## 2020-10-16 DIAGNOSIS — J4489 Other specified chronic obstructive pulmonary disease: Secondary | ICD-10-CM

## 2020-10-16 DIAGNOSIS — D735 Infarction of spleen: Secondary | ICD-10-CM

## 2020-10-16 MED ORDER — ATORVASTATIN CALCIUM 40 MG PO TABS: 40.0000 mg | ORAL_TABLET | Freq: Every day | ORAL | 3 refills | Status: DC

## 2020-10-16 MED ORDER — OZEMPIC (1 MG/DOSE) 4 MG/3ML ~~LOC~~ SOPN
1.0000 mg | PEN_INJECTOR | SUBCUTANEOUS | 2 refills | Status: DC
Start: 2020-10-16 — End: 2020-11-21

## 2020-10-16 MED ORDER — OZEMPIC (1 MG/DOSE) 4 MG/3ML ~~LOC~~ SOPN: 1.0000 mg | PEN_INJECTOR | SUBCUTANEOUS | 2 refills | Status: DC

## 2020-10-16 MED ORDER — ALBUTEROL SULFATE HFA 108 (90 BASE) MCG/ACT IN AERS: 2.0000 | INHALATION_SPRAY | Freq: Four times a day (QID) | RESPIRATORY_TRACT | 3 refills | Status: DC | PRN

## 2020-10-16 MED ORDER — CLOPIDOGREL BISULFATE 75 MG PO TABS: 75.0000 mg | ORAL_TABLET | Freq: Every day | ORAL | 3 refills | Status: DC

## 2020-10-16 MED ORDER — ASPIRIN 81 MG PO TBEC: 81.0000 mg | DELAYED_RELEASE_TABLET | Freq: Every day | ORAL | 3 refills | Status: AC

## 2020-10-16 NOTE — Chronic Care Management (AMB) (Signed)
Chronic Care Management Pharmacy Note  10/16/2020 Name:  Benjamin Conley MRN:  [id number] DOB:  1965-05-15  Subjective: Benjamin Conley is an 55 y.o. year old male who is a primary patient of Flinchum, Kelby Aline, [name].  Collaborating with covering provider, Dr. Derrel Nip, while PCP is on leave. The CCM team was consulted for assistance with disease management and care coordination needs.    Engaged with patient by telephone for follow up visit in response to provider referral for pharmacy case management and/or care coordination services.   Consent to Services:  The patient was given information about Chronic Care Management services, agreed to services, and gave verbal consent prior to initiation of services.  Please see initial visit note for detailed documentation.   Patient Care Team: Doreen Beam, [name] as PCP - General (Family Medicine) [name], MD as PCP - Cardiology (Cardiology) Lloyd Huger, MD as Consulting Physician (Oncology) Germaine Pomfret, [facility] (Pharmacist) Neldon Labella, RN as Case Manager  Recent office visits: 4/8 - PCP- restart Ozempic (had run out), d/c metformin d/t diarrhea even on XR  Recent consult visits: 4/22 - heme [facility] - venofer for iron deficiency, lab work, V12 injection 5/5 - urology [facility] - continue tamsulosin 5/3 - vascular [facility] - continue management of DM, HTN 8/1 - reported via MyChart that FitBit notified he had Afib, cardiac monitor ordered  Hospital visits: None in previous 6 months  Objective:  Lab Results  Component Value Date   CREATININE 0.66 (L) 01/21/2020   CREATININE 0.70 10/21/2019   CREATININE 0.64 09/23/2019    Lab Results  Component Value Date   HGBA1C 8.0 (H) 01/21/2020   Last diabetic Eye exam:  Lab Results  Component Value Date/Time   HMDIABEYEEXA No Retinopathy 02/21/2020 12:00 AM    Last diabetic Foot exam: No results found for: HMDIABFOOTEX      Component Value  Date/Time   CHOL 94 01/21/2020 1505   TRIG 98 01/21/2020 1505   HDL 36 (L) 01/21/2020 1505   CHOLHDL 2.6 01/21/2020 1505   VLDL 28.4 10/21/2019 1432   [place] 40 01/21/2020 1505    Hepatic Function Latest Ref Rng & Units 01/21/2020 09/06/2019 08/21/2019  Total Protein 6.1 - 8.1 g/dL 6.8 7.4 7.6  Albumin 3.5 - 5.2 g/dL - 4.5 3.8  AST 10 - 35 U/L 9(L) 11 15  ALT 9 - 46 U/L _0 Alk Phosphatase 39 - 117 U/L - 79 78  Total Bilirubin 0.2 - 1.2 mg/dL 0.3 0.3 0.6    Lab Results  Component Value Date/Time   TSH 1.68 08/20/2019 02:52 PM    CBC Latest Ref Rng & Units 06/30/2020 03/29/2020 03/16/2020  WBC 4.0 - 10.5 K/uL 8.0 8.7 8.2  Hemoglobin 13.0 - 17.0 g/dL 13.1 11.8(L) 11.8(L)  Hematocrit 39.0 - 52.0 % 39.4 35.7(L) 35.9(L)  Platelets 150 - 400 K/uL 426(H) 503(H) 540(H)    Lab Results  Component Value Date/Time   VD25OH 13 (L) 01/21/2020 03:05 PM    Clinical ASCVD: Yes  The ASCVD Risk score [name], et al., 2013) failed to calculate for the following reasons:   The valid total cholesterol range is 130 to 320 mg/dL     Social History   Tobacco Use  Smoking Status Former   Packs/day: 1.00   Years: 48.00   Pack years: 48.00   Types: Cigarettes   Quit date: 02/12/2020   Years since quitting: 0.6  Smokeless Tobacco Never  BP Readings from Last 3 Encounters:  07/18/20 103/65  07/13/20 95/65  06/30/20 (!) 153/95   Pulse Readings from Last 3 Encounters:  07/18/20 (!) 104  07/13/20 (!) 105  06/30/20 96   Wt Readings from Last 3 Encounters:  07/18/20 193 lb (87.5 kg)  07/13/20 193 lb (87.5 kg)  06/30/20 193 lb 12.8 oz (87.9 kg)    Assessment: Review of patient past medical history, allergies, medications, health status, including review of consultants reports, laboratory and other test data, was performed as part of comprehensive evaluation and provision of chronic care management services.   SDOH:  (Social Determinants of Health) assessments and interventions  performed:  SDOH Interventions    Flowsheet Row Most Recent Value  SDOH Interventions   Financial Strain Interventions Intervention Not Indicated       CCM Care Plan  Allergies  Allergen Reactions   Wasp Venom Anaphylaxis   Wasp Venom Protein Anaphylaxis   Coffee Flavor Nausea And Vomiting    Patient states coffee gives him nausea and stomach cramps   Onion Nausea And Vomiting    Medications Reviewed Today     Reviewed by [name], RPH-CPP (Pharmacist) on 10/16/20 at 0907  Med List Status: <None>   Medication Order Taking? Sig Documenting Provider Last Dose Status Informant  albuterol (VENTOLIN HFA) 108 (90 Base) MCG/ACT inhaler [id number] No Inhale 2 puffs into the lungs every 6 (six) hours as needed for wheezing or shortness of breath.  Patient not taking: Reported on 10/16/2020   [name], NP Not Taking Active   aspirin EC 81 MG EC tablet [id number] No Take 1 tablet (81 mg total) by mouth daily.  Patient not taking: Reported on 10/16/2020   [name], MD Not Taking Active   atorvastatin (LIPITOR) 40 MG tablet [id number] No Take 1 tablet (40 mg total) by mouth daily.  Patient not taking: Reported on 10/16/2020   [name], NP Not Taking Active   blood glucose meter kit and supplies [id number] Yes Dispense based on patient and insurance preference. Use up to four times daily as directed. (FOR ICD-10 E10.9, E11.9). [name], NP Taking Active   clopidogrel (PLAVIX) 75 MG tablet [id number] Yes Take 1 tablet (75 mg total) by mouth daily. [name], NP Taking Active   glucose blood test strip [id number] Yes 1 each by Other route in the morning and at bedtime. Use to test blood sugar twice daily. Flinchum, Kelby Aline, [name] Taking Active   insulin glargine (LANTUS) 100 UNIT/ML Solostar Pen [id number] Yes Inject 30 Units into the skin at bedtime. Flinchum, Kelby Aline, [name] Taking Active            Med Note Darnelle Maffucci, Yaira Bernardi E   Mon Oct 16, 2020  9:02  AM) 40 units daily  Insulin Pen Needle (PEN NEEDLES) 32G X 4 MM MISC [id number] Yes Used to give daily insulin injections. Flinchum, Kelby Aline, [name] Taking Active   Insulin Pen Needle 32G X 4 MM MISC [id number] No Use as directed to administer insulin  Patient not taking: Reported on 10/16/2020   Doreen Beam, [name] Not Taking Active   Iron-Vitamin C (VITRON-C) 65-125 MG TABS [id number] No Take 1 tablet by mouth daily.  Patient not taking: Reported on 10/16/2020   [name], NP Not Taking Active   nystatin-triamcinolone ointment [facility]) [id number] No Apply 1 application topically 2 (two) times daily.  Patient not taking: Reported on 10/16/2020   Doreen Beam,  [name] Not Taking Active   OZEMPIC, 0.25 OR 0.5 MG/DOSE, 2 MG/1.5ML SOPN [id number] Yes INJECT 0.$RemoveBefo'5MG'akTsUfXysru$  INTO THE SKIN ONCE A WEEK Flinchum, Kelby Aline, [name] Taking Active   sildenafil (VIAGRA) 100 MG tablet [id number] No Take 1 tablet (100 mg total) by mouth daily as needed for erectile dysfunction.  Patient not taking: Reported on 10/16/2020   [name], PA-C Not Taking Active   tamsulosin [facility] 0.4 MG CAPS capsule [id number] Yes Take 1 capsule (0.4 mg total) by mouth daily. Flinchum, Kelby Aline, [name] Taking Active   Tiotropium Bromide-Olodaterol (STIOLTO RESPIMAT) 2.5-2.5 MCG/ACT AERS [id number] No Inhale 2 puffs into the lungs daily.  Patient not taking: Reported on 10/16/2020   [name], MD Not Taking Active             Patient Active Problem List   Diagnosis Date Noted   Screening for blood or protein in urine 06/16/2020   Contact dermatitis 06/16/2020   Leukocytosis 01/30/2020   Nicotine use disorder 01/27/2020   Slow transit constipation 01/27/2020   Nail avulsion of toe, initial encounter 12/09/2019   Benign prostatic hyperplasia with hesitancy 10/30/2019   Abnormal ejaculation 10/21/2019   B12 deficiency 10/21/2019   Rectal pressure 10/21/2019   Thrombosis of splenic artery (HCC) 10/03/2019    Abnormal brain MRI 09/22/2019   Difficulty sleeping 09/22/2019   Tinnitus of both ears 09/22/2019   Pain due to onychomycosis of toenails of both feet 09/06/2019   DM type 2, uncontrolled, with neuropathy ([place]) 09/06/2019   Coagulation disorder ([place]) 09/06/2019   Iron deficiency anemia due to chronic blood loss 08/29/2019   Thrombocytosis 08/24/2019   Angiodysplasia of intestinal tract    Black stool 08/22/2019   Presence of arterial stent - splenic artery 08/21/2019   Dizziness 08/19/2019   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   COPD with chronic bronchitis ([place]) 08/01/2019   Tobacco abuse 08/01/2019   Diabetes mellitus without complication ([place])    Hyperlipidemia    Impingement syndrome of shoulder region 03/30/2018   Hypertension 11/10/2013    Immunization History  Administered Date(s) Administered   Td 03/11/2004   Tdap 08/02/2014    Conditions to be addressed/monitored: HTN, HLD, and DMII  Care Plan : Medication Management  Updates made by [name], RPH-CPP since 10/16/2020 12:00 AM     Problem: Diabetes, Tobacco Abuse, Splenic Infarct, Tachycardia      Long-Range Goal: Disease Progression Prevention   This Visit's Progress: On track  Recent Progress: On track  Priority: High  Note:   Current Barriers:  Unable to independently monitor therapeutic efficacy Unable to achieve control of diabetes  Suboptimal cardiovascular risk reduction  Pharmacist Clinical Goal(s):  Over the next 90 days, patient will achieve adherence to monitoring guidelines and medication adherence to achieve therapeutic efficacy. Over the next 90 days, patient will achieve control of diabetes as evidenced by improvement in A1c through collaboration with PharmD and provider.   Interventions: 1:1 collaboration with covering provider, Deborra Medina, MD regarding development and update of comprehensive plan of care as evidenced by provider attestation and co-signature Inter-disciplinary care team  collaboration (see longitudinal plan of care) Comprehensive medication review performed; medication list updated in electronic medical record  Health Maintenance: Follow up with PCP scheduled. Due for DM foot exam, routine lab work.   Diabetes: Uncontrolled but improving; current treatment: Ozempic 0.5 mg, Lantus 40 units daily  Hx metformin - could not tolerate d/t diarrhea, even XR formulation. Current glucose readings: fasting glucose: 80-140s;  post prandial glucose: 111-200 Notes that his pharmacy is out of Ozempic 0.25/0.5 mg weekly Discussed increasing Ozempic dose to allow for continued reduction of basal insulin dose. Patient in agreement. Increase Ozempic to 1 mg weekly. Proactively reduce Lantus to 36 units once daily.   [facility] was out of Ozempic. Contacted [facility], [facility], [facility], [facility] in the same geographic area. [facility] did have 1 mg in stock. Patient in agreement to send script there. Contacted [facility], asked to put Ozempic order on hold.  Follow up with PCP in office for lab work as scheduled.   Hypertension: Controlled per home readings; current regimen: none  Hx metoprolol - d/c d/t hypotension Current home readings: per Fitbit SBP 100-120/60-70s, HR remaining 90-110s. Reports that FitBit noted he may have Afib. Contacted cardiology and they have him wearing a cardiac monitor.  Continue to follow up with cardiology as scheduled  Hyperlipidemia, hx splenic infarct Controlled per last lipid panel; current treatment: atorvastatin 40 mg daily - though reports he ran out Antiplatelet regimen: aspirin 81 mg daily - though reports he has not been taking because he ran out, clopidogrel 75  mg daily  Reviewed importance of communicating refill needs to his pharmacy/provider's office. Will send refills on atorvastatin, aspirin, and clopidogrel today to reduce risk of nonadherence and subsequent events Recommended to continue current regimen at this time  Chronic  Obstructive Pulmonary Disease, hx Tobacco Abuse Confirms avoidance of tobacco products since 02/12/20. Denies use of either Stiolto 2.5/2.5 mcg 2 puffs daily or albuterol HFA rescue inhaler recently.  Reports that he has tried to light up once or twice, but found it gross. Congratulated on remaining tobacco free.  Sending refill on albuterol HFA to have on file in case patient needs. Continue current regimen. Follow for need to re-initiate pharmacotherapy moving forward. Will focus on adherence to other medications as above at this time.   Iron Deficiency Anemia: Managed by hematology; recent Venofer infusion; reports he has not been taking the iron + Vitamin C supplement at home. Advised to ask hematology at next appointment and request a refill from them if he is to continue oral supplementation. Patient verbalized understanding.   BPH: Controlled per patient report; current regimen: tamsulosin 0.4 mg daily; follows w/ urology [facility] Recommended to continue current regimen at this time   Patient Goals/Self-Care Activities Over the next 90 days, patient will:  - take medications as prescribed check blood glucose daily, document, and provide at future appointments check blood pressure daily, document, and provide at future appointments   Follow Up Plan: Telephone follow up appointment with care management team member scheduled for: 12 weeks (~ 6 weeks after PCP visit)      Medication Assistance: None required.  Patient affirms current coverage meets needs.  Patient's preferred pharmacy is:  [facility] [place], [place] - [address] [place] [address] Phone: [phone] Fax: [phone]  [facility] [address], [place] - [place] [address] Phone: [phone] Fax: [phone]  [id number] - [place], [place] - [place]  [address] Phone: [phone] Fax: [phone]  [place], [place] - [place] [address] Phone: [phone] Fax: [phone]    Follow Up:  Patient agrees to Care Plan and Follow-up.  Plan: Telephone follow up appointment with care management team member scheduled  for:  12 weeks  Catie Darnelle Maffucci, PharmD, [place], [place] Clinical Pharmacist [employer] at [place]

## 2020-10-16 NOTE — Patient Instructions (Signed)
Visit Information  PATIENT GOALS:  Goals Addressed               This Visit's Progress     Patient Stated     Disease Progression Prevention (pt-stated)        Patient Goals/Self-Care Activities Over the next 90 days, patient will:  - take medications as prescribed check blood glucose daily, document, and provide at future appointments check blood pressure daily, document, and provide at future appointments         Patient verbalizes understanding of instructions provided today and agrees to view in [place].   Plan: Telephone follow up appointment with care management team member scheduled for:  12 weeks  Catie Darnelle Maffucci, PharmD, [place], [place] Clinical Pharmacist [employer] at [employer] [phone]

## 2020-10-19 ENCOUNTER — Ambulatory Visit (INDEPENDENT_AMBULATORY_CARE_PROVIDER_SITE_OTHER): Payer: Medicare HMO

## 2020-10-19 ENCOUNTER — Encounter: Payer: Self-pay | Admitting: Adult Health

## 2020-10-19 VITALS — Ht 64.0 in | Wt 193.0 lb

## 2020-10-19 DIAGNOSIS — Z0000 Encounter for general adult medical examination without abnormal findings: Secondary | ICD-10-CM | POA: Diagnosis not present

## 2020-10-19 NOTE — Patient Instructions (Addendum)
Benjamin Conley , Thank you for taking time to come for your Medicare Wellness Visit. I appreciate your ongoing commitment to your health goals. Please review the following plan we discussed and let me know if I can assist you in the future.   These are the goals we discussed:  Goals       Patient Stated     Disease Progression Prevention (pt-stated)      Patient Goals/Self-Care Activities Over the next 90 days, patient will:  - take medications as prescribed check blood glucose daily, document, and provide at future appointments check blood pressure daily, document, and provide at future appointments       I need to drink more water (pt-stated)      Stay hydrated        This is a list of the screening recommended for you and due dates:  Health Maintenance  Topic Date Due   Complete foot exam   Never done   Hemoglobin A1C  07/20/2020   Flu Shot  12/05/2020*   Hepatitis C Screening: USPSTF Recommendation to screen - Ages 18-79 yo.  10/19/2021*   Urine Protein Check  01/20/2021   Eye exam for diabetics  02/20/2021   Tetanus Vaccine  08/01/2024   Colon Cancer Screening  08/23/2029   HIV Screening  Completed   HPV Vaccine  Aged Out   Pneumococcal Vaccination  Discontinued   Pneumococcal vaccine  Discontinued   COVID-19 Vaccine  Discontinued   Zoster (Shingles) Vaccine  Discontinued  *Topic was postponed. The date shown is not the original due date.    Advanced directives: End of life planning; Advance aging; Advanced directives discussed.  Copy of current HCPOA/Living Will requested.    Conditions/risks identified: none new  Follow up in one year for your annual wellness visit   Preventive Care 40-64 Years, Male Preventive care refers to lifestyle choices and visits with your health care provider that can promote health and wellness. What does preventive care include? A yearly physical exam. This is also called an annual well check. Dental exams once or twice a  year. Routine eye exams. Ask your health care provider how often you should have your eyes checked. Personal lifestyle choices, including: Daily care of your teeth and gums. Regular physical activity. Eating a healthy diet. Avoiding tobacco and drug use. Limiting alcohol use. Practicing safe sex. Taking low-dose aspirin every day starting at age 16. What happens during an annual well check? The services and screenings done by your health care provider during your annual well check will depend on your age, overall health, lifestyle risk factors, and family history of disease. Counseling  Your health care provider may ask you questions about your: Alcohol use. Tobacco use. Drug use. Emotional well-being. Home and relationship well-being. Sexual activity. Eating habits. Work and work Statistician. Screening  You may have the following tests or measurements: Height, weight, and BMI. Blood pressure. Lipid and cholesterol levels. These may be checked every 5 years, or more frequently if you are over 17 years old. Skin check. Lung cancer screening. You may have this screening every year starting at age 40 if you have a 30-pack-year history of smoking and currently smoke or have quit within the past 15 years. Fecal occult blood test (FOBT) of the stool. You may have this test every year starting at age 31. Flexible sigmoidoscopy or colonoscopy. You may have a sigmoidoscopy every 5 years or a colonoscopy every 10 years starting at age 63. Prostate cancer  screening. Recommendations will vary depending on your family history and other risks. Hepatitis C blood test. Hepatitis B blood test. Sexually transmitted disease (STD) testing. Diabetes screening. This is done by checking your blood sugar (glucose) after you have not eaten for a while (fasting). You may have this done every 1-3 years. Discuss your test results, treatment options, and if necessary, the need for more tests with your health  care provider. Vaccines  Your health care provider may recommend certain vaccines, such as: Influenza vaccine. This is recommended every year. Tetanus, diphtheria, and acellular pertussis (Tdap, Td) vaccine. You may need a Td booster every 10 years. Zoster vaccine. You may need this after age 15. Pneumococcal 13-valent conjugate (PCV13) vaccine. You may need this if you have certain conditions and have not been vaccinated. Pneumococcal polysaccharide (PPSV23) vaccine. You may need one or two doses if you smoke cigarettes or if you have certain conditions. Talk to your health care provider about which screenings and vaccines you need and how often you need them. This information is not intended to replace advice given to you by your health care provider. Make sure you discuss any questions you have with your health care provider. Document Released: 03/24/2015 Document Revised: 11/15/2015 Document Reviewed: 12/27/2014 Elsevier Interactive Patient Education  2017 [place] Prevention in the Home Falls can cause injuries. They can happen to people of all ages. There are many things you can do to make your home safe and to help prevent falls. What can I do on the outside of my home? Regularly fix the edges of walkways and driveways and fix any cracks. Remove anything that might make you trip as you walk through a door, such as a raised step or threshold. Trim any bushes or trees on the path to your home. Use bright outdoor lighting. Clear any walking paths of anything that might make someone trip, such as rocks or tools. Regularly check to see if handrails are loose or broken. Make sure that both sides of any steps have handrails. Any raised decks and porches should have guardrails on the edges. Have any leaves, snow, or ice cleared regularly. Use sand or salt on walking paths during winter. Clean up any spills in your garage right away. This includes oil or grease spills. What can I  do in the bathroom? Use night lights. Install grab bars by the toilet and in the tub and shower. Do not use towel bars as grab bars. Use non-skid mats or decals in the tub or shower. If you need to sit down in the shower, use a plastic, non-slip stool. Keep the floor dry. Clean up any water that spills on the floor as soon as it happens. Remove soap buildup in the tub or shower regularly. Attach bath mats securely with double-sided non-slip rug tape. Do not have throw rugs and other things on the floor that can make you trip. What can I do in the bedroom? Use night lights. Make sure that you have a light by your bed that is easy to reach. Do not use any sheets or blankets that are too big for your bed. They should not hang down onto the floor. Have a firm chair that has side arms. You can use this for support while you get dressed. Do not have throw rugs and other things on the floor that can make you trip. What can I do in the kitchen? Clean up any spills right away. Avoid walking on wet floors.  Keep items that you use a lot in easy-to-reach places. If you need to reach something above you, use a strong step stool that has a grab bar. Keep electrical cords out of the way. Do not use floor polish or wax that makes floors slippery. If you must use wax, use non-skid floor wax. Do not have throw rugs and other things on the floor that can make you trip. What can I do with my stairs? Do not leave any items on the stairs. Make sure that there are handrails on both sides of the stairs and use them. Fix handrails that are broken or loose. Make sure that handrails are as long as the stairways. Check any carpeting to make sure that it is firmly attached to the stairs. Fix any carpet that is loose or worn. Avoid having throw rugs at the top or bottom of the stairs. If you do have throw rugs, attach them to the floor with carpet tape. Make sure that you have a light switch at the top of the stairs  and the bottom of the stairs. If you do not have them, ask someone to add them for you. What else can I do to help prevent falls? Wear shoes that: Do not have high heels. Have rubber bottoms. Are comfortable and fit you well. Are closed at the toe. Do not wear sandals. If you use a stepladder: Make sure that it is fully opened. Do not climb a closed stepladder. Make sure that both sides of the stepladder are locked into place. Ask someone to hold it for you, if possible. Clearly mark and make sure that you can see: Any grab bars or handrails. First and last steps. Where the edge of each step is. Use tools that help you move around (mobility aids) if they are needed. These include: Canes. Walkers. Scooters. Crutches. Turn on the lights when you go into a dark area. Replace any light bulbs as soon as they burn out. Set up your furniture so you have a clear path. Avoid moving your furniture around. If any of your floors are uneven, fix them. If there are any pets around you, be aware of where they are. Review your medicines with your doctor. Some medicines can make you feel dizzy. This can increase your chance of falling. Ask your doctor what other things that you can do to help prevent falls. This information is not intended to replace advice given to you by your health care provider. Make sure you discuss any questions you have with your health care provider. Document Released: 12/22/2008 Document Revised: 08/03/2015 Document Reviewed: 04/01/2014 Elsevier Interactive Patient Education  2017 [employer].

## 2020-10-19 NOTE — Progress Notes (Signed)
Subjective:   Loudon Ulyses Panico is a 55 y.o. male who presents for Medicare Annual/Subsequent preventive examination.  Review of Systems    No ROS.  Medicare Wellness Virtual Visit.  Visual/audio telehealth visit, UTA vital signs.   See social history for additional risk factors.   Cardiac Risk Factors include: male gender;diabetes mellitus;hypertension     Objective:    Today's Vitals   10/19/20 0835  Weight: 193 lb (87.5 kg)  Height: _0  (1.626 m)   Body mass index is 33.13 kg/m.  Advanced Directives 10/19/2020 06/30/2020 03/31/2020 12/30/2019 10/19/2019 09/23/2019 09/17/2019  Does Patient Have a Medical Advance Directive? Yes No No No Yes No No  Type of Paramedic of [place];Living will - - Out of facility DNR (pink MOST or yellow form) Living will - -  Does patient want to make changes to medical advance directive? No - Patient declined - - No - Patient declined No - Patient declined - -  Copy of [place] in Chart? No - copy requested - - - - - -  Would patient like information on creating a medical advance directive? - No - Patient declined No - Patient declined No - Patient declined - - No - Patient declined    Current Medications (verified) Outpatient Encounter Medications as of 10/19/2020  Medication Sig   albuterol (VENTOLIN HFA) 108 (90 Base) MCG/ACT inhaler Inhale 2 puffs into the lungs every 6 (six) hours as needed for wheezing or shortness of breath.   aspirin 81 MG EC tablet Take 1 tablet (81 mg total) by mouth daily.   atorvastatin (LIPITOR) 40 MG tablet Take 1 tablet (40 mg total) by mouth daily.   blood glucose meter kit and supplies Dispense based on patient and insurance preference. Use up to four times daily as directed. (FOR ICD-10 E10.9, E11.9).   clopidogrel (PLAVIX) 75 MG tablet Take 1 tablet (75 mg total) by mouth daily.   glucose blood test strip 1 each by Other route in the morning and at bedtime. Use to test  blood sugar twice daily.   insulin glargine (LANTUS) 100 UNIT/ML Solostar Pen Inject 30 Units into the skin at bedtime.   Insulin Pen Needle (PEN NEEDLES) 32G X 4 MM MISC Used to give daily insulin injections.   Insulin Pen Needle 32G X 4 MM MISC Use as directed to administer insulin (Patient not taking: Reported on 10/16/2020)   Iron-Vitamin C (VITRON-C) 65-125 MG TABS Take 1 tablet by mouth daily. (Patient not taking: Reported on 10/16/2020)   nystatin-triamcinolone ointment (MYCOLOG) Apply 1 application topically 2 (two) times daily. (Patient not taking: Reported on 10/16/2020)   Semaglutide, 1 MG/DOSE, (OZEMPIC, 1 MG/DOSE,) 4 MG/3ML SOPN Inject 1 mg into the skin once a week.   sildenafil (VIAGRA) 100 MG tablet Take 1 tablet (100 mg total) by mouth daily as needed for erectile dysfunction. (Patient not taking: Reported on 10/16/2020)   tamsulosin (FLOMAX) 0.4 MG CAPS capsule Take 1 capsule (0.4 mg total) by mouth daily.   Tiotropium Bromide-Olodaterol (STIOLTO RESPIMAT) 2.5-2.5 MCG/ACT AERS Inhale 2 puffs into the lungs daily. (Patient not taking: Reported on 10/16/2020)   No facility-administered encounter medications on file as of 10/19/2020.    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   Tobacco abuse    Upper GI bleed 08/21/2019   Past Surgical History:  Procedure Laterality Date   BACK SURGERY     lumbar   COLONOSCOPY WITH PROPOFOL N/A 08/24/2019   Procedure: COLONOSCOPY WITH PROPOFOL;  Surgeon: [name], MD;  Location: [facility]  ENDOSCOPY;  Service: Endoscopy;  Laterality: N/A;   ESOPHAGOGASTRODUODENOSCOPY (EGD) WITH PROPOFOL N/A 08/24/2019   Procedure: ESOPHAGOGASTRODUODENOSCOPY (EGD) WITH PROPOFOL;  Surgeon: [name], MD;  Location[facility] ENDOSCOPY;  Service: Endoscopy;  Laterality: N/A;   STENT PLACEMENT VASCULAR ([place] [facility])  07/2019   stenosis of distal splenic artery and stent placed   VISCERAL ANGIOGRAPHY N/A 08/06/2019   Procedure: VISCERAL ANGIOGRAPHY;  Surgeon: [name], MD;  Location: [place] CV LAB;  Service: Cardiovascular;  Laterality: N/A;   Family History  Problem Relation Age of Onset   Diabetes Mellitus II Mother    Diabetes Mellitus II Brother    Hyperlipidemia Brother    Tuberculosis Father    Heart disease Father    Cancer Maternal Aunt    Colon cancer Maternal Uncle    Diabetes Sister    Diabetes Sister    Diabetes Sister    Diabetes Sister    Prostate cancer Neg Hx    Social History   Socioeconomic History   Marital status: Single    Spouse name: Not on file   Number of children: Not on file   Years of education: Not on file   Highest education level: Not on file  Occupational History   Not on file  Tobacco Use   Smoking status: Former    Packs/day: 1.00    Years: 48.00    Pack years: 48.00    Types: Cigarettes    Quit date: 02/12/2020    Years since quitting: 0.6   Smokeless tobacco: Never  Vaping Use   Vaping Use: Never used  Substance and Sexual Activity   Alcohol use: No    Comment: Never been a problem   Drug use: No   Sexual activity: Yes  Other Topics Concern   Not on file  Social History Narrative   Single, lives alone, unemployed.   Social Determinants of Health   Financial Resource Strain: Low Risk    Difficulty of Paying Living Expenses: Not hard at all  Food Insecurity: No Food Insecurity   Worried About Charity fundraiser in the Last Year: Never true   [place] in the Last Year: Never true  Transportation Needs: No  Transportation Needs   Lack of Transportation (Medical): No   Lack of Transportation (Non-Medical): No  Physical Activity: Insufficiently Active   Days of Exercise per Week: 4 days   Minutes of Exercise per Session: 20 min  Stress: No Stress Concern Present   Feeling of Stress : Not at all  Social Connections: Unknown   Frequency of Communication with Friends and Family: Not on file   Frequency of Social Gatherings with Friends and Family: More than three times a week   Attends Religious Services: Not on file   Active Member of Clubs or Organizations: No   Attends Archivist Meetings: Not on file   Marital Status: Not on file    Tobacco Counseling Counseling  given: Not Answered   Clinical Intake:  Pre-visit preparation completed: Yes        Diabetes: Yes (Followed by pcp)  How often do you need to have someone help you when you read instructions, pamphlets, or other written materials from your doctor or pharmacy?: 1 - Never  Nutrition Risk Assessment: Has the patient had any N/V/D within the last 2 months?  No  Does the patient have any non-healing wounds?  No  Has the patient had any unintentional weight loss or weight gain?  No   Diabetes: If diabetic, was a CBG obtained today?  No  Did the patient bring in their glucometer from home?  No  How often do you monitor your CBG's? Daily.   Financial Strains and Diabetes Management: Are you having any financial strains with the device, your supplies or your medication? No .  Does the patient want to be seen by Chronic Care Management for management of their diabetes?  No  Would the patient like to be referred to a Nutritionist or for Diabetic Management?  No    Interpreter Needed?: No      Activities of Daily Living In your present state of health, do you have any difficulty performing the following activities: 10/19/2020 06/16/2020  Hearing? Y Y  Comment Followed by ENT -  Vision? N N  Difficulty  concentrating or making decisions? N N  Walking or climbing stairs? Y Y  Comment Chronic back pain. Paces self -  Dressing or bathing? N N  Doing errands, shopping? N N  Preparing Food and eating ? N -  Using the Toilet? N -  In the past six months, have you accidently leaked urine? N -  Do you have problems with loss of bowel control? N -  Managing your Medications? N -  Managing your Finances? N -  Housekeeping or managing your Housekeeping? N -  Some recent data might be hidden    Patient Care Team: Flinchum, Kelby Aline, [name] as PCP - General (Family Medicine) [name], MD as PCP - Cardiology (Cardiology) Lloyd Huger, MD as Consulting Physician (Oncology) Germaine Pomfret, [facility] (Pharmacist) Neldon Labella, RN as Case Manager  Indicate any recent Medical Services you may have received from other than [facility] providers in the past year (date may be approximate).     Assessment:   This is a routine wellness examination for [place].  I connected with [place] today by telephone and verified that I am speaking with the correct person using two identifiers. Location patient: home Location provider: work Persons participating in the virtual visit: patient, Marine scientist.    I discussed the limitations, risks, security and privacy concerns of performing an evaluation and management service by telephone and the availability of in person appointments. The patient expressed understanding and verbally consented to this telephonic visit.    Interactive audio and video telecommunications were attempted between this provider and patient, however failed, due to patient having technical difficulties OR patient did not have access to video capability.  We continued and completed visit with audio only.  Some vital signs may be absent or patient reported.   Hearing/Vision screen Hearing Screening - Comments:: Patient has difficulty hearing some conversational tones.   Hearing aids not  currently worn but plans to receive in the future when costs improve.  Vision Screening - Comments:: Followed by [facility]  Wears corrective lenses  No retinopathy reported  Dietary issues and exercise activities discussed: Current Exercise Habits: Home  exercise routine, Type of exercise: walking, Intensity: Mild Regular diet Fair water intake   Goals Addressed               This Visit's Progress     Patient Stated     I need to drink more water (pt-stated)        Stay hydrated       Depression Screen PHQ 2/9 Scores 10/19/2020 07/10/2020 06/16/2020 10/19/2019 08/19/2019 08/11/2019  PHQ - 2 Score 0 0 0 0 0 1  PHQ- 9 Score - - 2 - 3 9    Fall Risk Fall Risk  10/19/2020 06/16/2020 01/21/2020 10/19/2019 08/19/2019  Falls in the past year? 0 1 0 0 0  Number falls in past yr: - 1 - 0 0  Injury with Fall? - 1 - - 0  Risk for fall due to : - History of fall(s) - - -  Follow up _0     FALL RISK PREVENTION PERTAINING TO THE HOME: Adequate lighting in your home to reduce risk of falls? Yes   ASSISTIVE DEVICES UTILIZED TO PREVENT FALLS:  Life alert? No  Use of a cane, walker or w/c? No   TIMED UP AND GO: Was the test performed? No .    Cognitive Function:     6CIT Screen 10/19/2020 10/19/2019  What Year? 0 points 0 points  What month? 0 points 0 points  What time? 0 points -  Count back from 20 0 points -  Months in reverse 4 points 4 points  Repeat phrase 2 points -  Total Score 6 -    Immunizations Immunization History  Administered Date(s) Administered   Td 03/11/2004   Tdap 08/02/2014    Health Maintenance Health Maintenance  Topic Date Due   FOOT EXAM  Never done   HEMOGLOBIN A1C  07/20/2020   INFLUENZA VACCINE  12/05/2020 (Originally 10/09/2020)   Hepatitis C Screening  10/19/2021 (Originally 10/21/1983)   URINE MICROALBUMIN   01/20/2021   OPHTHALMOLOGY EXAM  02/20/2021   TETANUS/TDAP  08/01/2024   COLONOSCOPY (Pts 45-58yr Insurance coverage will need to be confirmed)  08/23/2029   HIV Screening  Completed   HPV VACCINES  Aged Out   Pneumococcal Vaccine [id number] old  Discontinued   PNEUMOCOCCAL POLYSACCHARIDE VACCINE AGE 82-64 HIGH RISK  Discontinued   COVID-19 Vaccine  Discontinued   Zoster Vaccines- Shingrix  Discontinued   Hepatitis C Screening: declined.   Vision Screening: Recommended annual ophthalmology exams for early detection of glaucoma and other disorders of the eye.  Dental Screening: Recommended annual dental exams for proper oral hygiene  Community Resource Referral / Chronic Care Management: CRR required this visit?  No   CCM required this visit?  No      Plan:     I have personally reviewed and noted the following in the patient's chart:   Medical and social history Use of alcohol, tobacco or illicit drugs  Current medications and supplements including opioid prescriptions. Patient is not currently taking opioid prescriptions. Functional ability and status Nutritional status Physical activity Advanced directives List of other physicians Hospitalizations, surgeries, and ER visits in previous 12 months Vitals Screenings to include cognitive, depression, and falls Referrals and appointments  In addition, I have reviewed and discussed with patient certain preventive protocols, quality metrics, and best practice recommendations. A written personalized care plan for preventive services as well as general preventive health  recommendations were provided to patient via mychart.     Varney Biles, LPN   [id number]

## 2020-10-25 ENCOUNTER — Other Ambulatory Visit: Payer: Self-pay | Admitting: *Deleted

## 2020-10-25 DIAGNOSIS — D500 Iron deficiency anemia secondary to blood loss (chronic): Secondary | ICD-10-CM

## 2020-10-25 NOTE — Telephone Encounter (Signed)
As of now there are no sooner appts available.

## 2020-10-27 DIAGNOSIS — R002 Palpitations: Secondary | ICD-10-CM | POA: Diagnosis not present

## 2020-11-02 ENCOUNTER — Encounter (INDEPENDENT_AMBULATORY_CARE_PROVIDER_SITE_OTHER): Payer: Self-pay

## 2020-11-03 ENCOUNTER — Encounter: Payer: Self-pay | Admitting: Internal Medicine

## 2020-11-03 ENCOUNTER — Inpatient Hospital Stay: Payer: Medicare HMO | Attending: Oncology

## 2020-11-03 ENCOUNTER — Other Ambulatory Visit: Payer: Self-pay

## 2020-11-03 ENCOUNTER — Inpatient Hospital Stay: Payer: Medicare HMO

## 2020-11-03 ENCOUNTER — Inpatient Hospital Stay (HOSPITAL_BASED_OUTPATIENT_CLINIC_OR_DEPARTMENT_OTHER): Payer: Medicare HMO | Admitting: Internal Medicine

## 2020-11-03 DIAGNOSIS — Z79899 Other long term (current) drug therapy: Secondary | ICD-10-CM | POA: Insufficient documentation

## 2020-11-03 DIAGNOSIS — D500 Iron deficiency anemia secondary to blood loss (chronic): Secondary | ICD-10-CM

## 2020-11-03 DIAGNOSIS — E538 Deficiency of other specified B group vitamins: Secondary | ICD-10-CM | POA: Insufficient documentation

## 2020-11-03 DIAGNOSIS — D75839 Thrombocytosis, unspecified: Secondary | ICD-10-CM | POA: Insufficient documentation

## 2020-11-03 LAB — BASIC METABOLIC PANEL
Anion gap: 8 (ref 5–15)
BUN: 11 mg/dL (ref 6–20)
CO2: 27 mmol/L (ref 22–32)
Calcium: 8.6 mg/dL — ABNORMAL LOW (ref 8.9–10.3)
Chloride: 102 mmol/L (ref 98–111)
Creatinine, Ser: 0.67 mg/dL (ref 0.61–1.24)
GFR, Estimated: >60 mL/min (ref >60)
Glucose, Bld: 137 mg/dL — ABNORMAL HIGH (ref 70–99)
Potassium: 4 mmol/L (ref 3.5–5.1)
Sodium: 137 mmol/L (ref 135–145)

## 2020-11-03 LAB — CBC WITH DIFFERENTIAL/PLATELET
Abs Immature Granulocytes: 0.01 10*3/uL (ref 0.00–0.07)
Basophils Absolute: 0.1 10*3/uL (ref 0.0–0.1)
Basophils Relative: 1 %
Eosinophils Absolute: 0.1 10*3/uL (ref 0.0–0.5)
Eosinophils Relative: 1 %
HCT: 42.1 % (ref 39.0–52.0)
Hemoglobin: 13.9 g/dL (ref 13.0–17.0)
Immature Granulocytes: 0 %
Lymphocytes Relative: 43 %
Lymphs Abs: 3 10*3/uL (ref 0.7–4.0)
MCH: 29.4 pg (ref 26.0–34.0)
MCHC: 33 g/dL (ref 30.0–36.0)
MCV: 89.2 fL (ref 80.0–100.0)
Monocytes Absolute: 0.6 10*3/uL (ref 0.1–1.0)
Monocytes Relative: 9 %
Neutro Abs: 3.3 10*3/uL (ref 1.7–7.7)
Neutrophils Relative %: 46 %
Platelets: 417 10*3/uL — ABNORMAL HIGH (ref 150–400)
RBC: 4.72 MIL/uL (ref 4.22–5.81)
RDW: 13.5 % (ref 11.5–15.5)
WBC: 7.1 10*3/uL (ref 4.0–10.5)
nRBC: 0 % (ref 0.0–0.2)

## 2020-11-03 MED ORDER — CYANOCOBALAMIN 1000 MCG/ML IJ SOLN
1000.0000 ug | Freq: Once | INTRAMUSCULAR | Status: AC
  Administered 2020-11-03: 1000 ug via INTRAMUSCULAR
  Filled 2020-11-03: qty 1

## 2020-11-03 NOTE — Progress Notes (Signed)
[place] NOTE  Patient Care Team: Flinchum, Kelby Aline, [name] as PCP - General (Family Medicine) [name], MD as PCP - Cardiology (Cardiology) Lloyd Huger, MD as Consulting Physician (Oncology) Germaine Pomfret, [facility] (Pharmacist) Neldon Labella, RN as Case Manager  CHIEF COMPLAINTS/PURPOSE OF CONSULTATION: Anemia sec to Hx of GIB  # ANEMIA sec to GIB [Dr.Finnegan; s/p EGD/colo- June 2021-s/p angiodysplasia small bowel/large bowel status post argon laser Dr.Wohl]; Venofer x4 in January 2022-for iron deficiency hemoglobin around 11.  # Thrombocytosis [JAK-2 NEG]  # B12 def- on B12 injections  Oncology History   No history exists.     HISTORY OF PRESENTING ILLNESS:  Benjamin Conley 55 y.o.  male patient with a history of iron deficient anemia secondary to chronic GI bleed/angiodysplasia is here for follow-up.  Patient denies any blood in stools or black-colored stools.  Is not on iron pills.  Not on B12.  Denies any fatigue.  Review of Systems  Constitutional:  Negative for chills, diaphoresis, fever, malaise/fatigue and weight loss.  HENT:  Negative for nosebleeds and sore throat.   Eyes:  Negative for double vision.  Respiratory:  Negative for cough, hemoptysis, sputum production, shortness of breath and wheezing.   Cardiovascular:  Negative for chest pain, palpitations, orthopnea and leg swelling.  Gastrointestinal:  Negative for abdominal pain, blood in stool, constipation, diarrhea, heartburn, melena, nausea and vomiting.  Genitourinary:  Negative for dysuria, frequency and urgency.  Musculoskeletal:  Negative for back pain and joint pain.  Skin: Negative.  Negative for itching and rash.  Neurological:  Negative for dizziness, tingling, focal weakness, weakness and headaches.  Endo/Heme/Allergies:  Does not bruise/bleed easily.  Psychiatric/Behavioral:  Negative for depression. The patient is not nervous/anxious and does not  have insomnia.     MEDICAL HISTORY:  Past Medical History:  Diagnosis Date   Acute posthemorrhagic anemia    Complication of anesthesia    c/o difficulty breathing after anesthesia   COPD (chronic obstructive pulmonary disease) (HCC)    Diabetes mellitus without complication ([place])    Hyperlipemia    Melena 08/21/2019   Paroxysmal supraventricular tachycardia (HCC) 08/19/2019   Postural dizziness with presyncope 08/21/2019   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   Tobacco abuse    Upper GI bleed 08/21/2019    SURGICAL HISTORY: Past Surgical History:  Procedure Laterality Date   BACK SURGERY     lumbar   COLONOSCOPY WITH PROPOFOL N/A 08/24/2019   Procedure: COLONOSCOPY WITH PROPOFOL;  Surgeon: [name], MD;  Location: [facility] ENDOSCOPY;  Service: Endoscopy;  Laterality: N/A;   ESOPHAGOGASTRODUODENOSCOPY (EGD) WITH PROPOFOL N/A 08/24/2019   Procedure: ESOPHAGOGASTRODUODENOSCOPY (EGD) WITH PROPOFOL;  Surgeon: [name], MD;  Location: [facility] ENDOSCOPY;  Service: Endoscopy;  Laterality: N/A;   STENT PLACEMENT VASCULAR ([place] [facility]X)  07/2019   stenosis of distal splenic artery and stent placed   VISCERAL ANGIOGRAPHY N/A 08/06/2019   Procedure: VISCERAL ANGIOGRAPHY;  Surgeon: [name], MD;  Location: [place] CV LAB;  Service: Cardiovascular;  Laterality: N/A;    SOCIAL HISTORY: Social History   Socioeconomic History   Marital status: Single    Spouse name: Not on file   Number of children: Not on file   Years of education: Not on file   Highest education level: Not on file  Occupational History   Not on file  Tobacco Use   Smoking status: Former    Packs/day: 1.00    Years: 48.00    Pack years: 48.00  Types: Cigarettes    Quit date: 02/12/2020    Years since quitting: 0.7   Smokeless tobacco: Never  Vaping Use   Vaping Use: Never used  Substance and Sexual Activity   Alcohol use: No    Comment: Never been a problem   Drug use: No   Sexual activity: Yes  Other Topics  Concern   Not on file  Social History Narrative   Single, lives alone, unemployed.   Social Determinants of Health   Financial Resource Strain: Low Risk    Difficulty of Paying Living Expenses: Not hard at all  Food Insecurity: No Food Insecurity   Worried About Charity fundraiser in the Last Year: Never true   [place] in the Last Year: Never true  Transportation Needs: No Transportation Needs   Lack of Transportation (Medical): No   Lack of Transportation (Non-Medical): No  Physical Activity: Insufficiently Active   Days of Exercise per Week: 4 days   Minutes of Exercise per Session: 20 min  Stress: No Stress Concern Present   Feeling of Stress : Not at all  Social Connections: Unknown   Frequency of Communication with Friends and Family: Not on file   Frequency of Social Gatherings with Friends and Family: More than three times a week   Attends Religious Services: Not on Electrical engineer or Organizations: No   Attends Music therapist: Not on file   Marital Status: Not on file  Intimate Partner Violence: Not At Risk   Fear of Current or Ex-Partner: No   Emotionally Abused: No   Physically Abused: No   Sexually Abused: No    FAMILY HISTORY: Family History  Problem Relation Age of Onset   Diabetes Mellitus II Mother    Diabetes Mellitus II Brother    Hyperlipidemia Brother    Tuberculosis Father    Heart disease Father    Cancer Maternal Aunt    Colon cancer Maternal Uncle    Diabetes Sister    Diabetes Sister    Diabetes Sister    Diabetes Sister    Prostate cancer Neg Hx     ALLERGIES:  is allergic to wasp venom, wasp venom protein, coffee flavor, and onion.  MEDICATIONS:  Current Outpatient Medications  Medication Sig Dispense Refill   albuterol (VENTOLIN HFA) 108 (90 Base) MCG/ACT inhaler Inhale 2 puffs into the lungs every 6 (six) hours as needed for wheezing or shortness of breath. 6.7 g 3   aspirin 81 MG EC tablet Take  1 tablet (81 mg total) by mouth daily. 90 tablet 3   atorvastatin (LIPITOR) 40 MG tablet Take 1 tablet (40 mg total) by mouth daily. 90 tablet 3   blood glucose meter kit and supplies Dispense based on patient and insurance preference. Use up to four times daily as directed. (FOR ICD-10 E10.9, E11.9). 1 each 0   clopidogrel (PLAVIX) 75 MG tablet Take 1 tablet (75 mg total) by mouth daily. 90 tablet 3   glucose blood test strip 1 each by Other route in the morning and at bedtime. Use to test blood sugar twice daily. 100 each 3   insulin glargine (LANTUS) 100 UNIT/ML Solostar Pen Inject 30 Units into the skin at bedtime. 15 mL 3   Insulin Pen Needle (PEN NEEDLES) 32G X 4 MM MISC Used to give daily insulin injections. 100 each 3   Semaglutide, 1 MG/DOSE, (OZEMPIC, 1 MG/DOSE,) 4 MG/3ML SOPN Inject 1 mg  into the skin once a week. 3 mL 2   tamsulosin (FLOMAX) 0.4 MG CAPS capsule Take 1 capsule (0.4 mg total) by mouth daily. 90 capsule 0   Insulin Pen Needle 32G X 4 MM MISC Use as directed to administer insulin (Patient not taking: No sig reported) 50 each 2   Iron-Vitamin C (VITRON-C) 65-125 MG TABS Take 1 tablet by mouth daily. (Patient not taking: No sig reported) 60 tablet 0   nystatin-triamcinolone ointment (MYCOLOG) Apply 1 application topically 2 (two) times daily. (Patient not taking: No sig reported) 60 g 0   sildenafil (VIAGRA) 100 MG tablet Take 1 tablet (100 mg total) by mouth daily as needed for erectile dysfunction. (Patient not taking: No sig reported) 30 tablet 0   Tiotropium Bromide-Olodaterol (STIOLTO RESPIMAT) 2.5-2.5 MCG/ACT AERS Inhale 2 puffs into the lungs daily. (Patient not taking: No sig reported) 1 each 0   No current facility-administered medications for this visit.   Facility-Administered Medications Ordered in Other Visits  Medication Dose Route Frequency Provider Last Rate Last Admin   cyanocobalamin ((VITAMIN B-12)) injection 1,000 mcg  1,000 mcg Intramuscular Once  Charlaine Dalton R, MD          .  PHYSICAL EXAMINATION:   Vitals:   11/03/20 1301  BP: 118/82  Pulse: (!) 101  Resp: 20  Temp: 97.8 F (36.6 C)  SpO2: 100%   Filed Weights   11/03/20 1301  Weight: 195 lb 9.6 oz (88.7 kg)    Physical Exam HENT:     Head: Normocephalic and atraumatic.     Mouth/Throat:     Pharynx: No oropharyngeal exudate.  Eyes:     Pupils: Pupils are equal, round, and reactive to light.  Cardiovascular:     Rate and Rhythm: Normal rate and regular rhythm.  Pulmonary:     Effort: Pulmonary effort is normal. No respiratory distress.     Breath sounds: Normal breath sounds. No wheezing.  Abdominal:     General: Bowel sounds are normal. There is no distension.     Palpations: Abdomen is soft. There is no mass.     Tenderness: There is no abdominal tenderness. There is no guarding or rebound.  Musculoskeletal:        General: No tenderness. Normal range of motion.     Cervical back: Normal range of motion and neck supple.  Skin:    General: Skin is warm.  Neurological:     Mental Status: He is alert and oriented to person, place, and time.  Psychiatric:        Mood and Affect: Affect normal.     LABORATORY DATA:  I have reviewed the data as listed Lab Results  Component Value Date   WBC 7.1 11/03/2020   HGB 13.9 11/03/2020   HCT 42.1 11/03/2020   MCV 89.2 11/03/2020   PLT 417 (H) 11/03/2020   Recent Labs    01/21/20 1505 11/03/20 1244  NA 138 137  K 4.0 4.0  CL 102 102  CO2 26 27  GLUCOSE 159* 137*  BUN 9 11  CREATININE 0.66* 0.67  CALCIUM 9.4 8.6*  GFRNONAA  --  >60  PROT 6.8  --   AST 9*  --   ALT 14  --   BILITOT 0.3  --     RADIOGRAPHIC STUDIES: I have personally reviewed the radiological images as listed and agreed with the findings in the report. LONG TERM MONITOR (3-14 DAYS)  Result Date: 10/30/2020 Patch Wear  Time:  14 days and 0 hours (2022-08-03T08:48:07-398 to 2022-08-17T08:48:11-398) Patient had a min  HR of 72 bpm, max HR of 174 bpm, and avg HR of 99 bpm. Predominant underlying rhythm was Sinus Rhythm. 1 run of Ventricular Tachycardia occurred lasting 5 beats with a max rate of 164 bpm (avg 135 bpm).  Occasional paroxysmal supraventricular Tachycardia runs occurred SVE Couplets were rare (<1.0%), and SVE Triplets were rare (<1.0%). Isolated VEs were rare (<1.0%), VE Couplets were rare (<1.0%), and no VE Triplets were present.   ASSESSMENT & PLAN:   Iron deficiency anemia due to chronic blood loss # Iron deficiency anemia-hemoglobin 13; however given recent iron deficiency proceed with Venofer today.   #Etiology- EGD/Colo [Dr.Wohl] June 2021 revealed a total of 4 angiodysplastic lesions that were cauterized with argon laser. Monitor closely.   # Thrombocytosis-446; JAK2 mutation negative; question other MPN Vs-reactive secondary deficiency- check MPL/CALR mutation  # B12 deficiency: Proceed with 1000 mcg intramuscular B12 today  # DISPOSITION: # HOLD venofer today # b12 injection TODAY # follow up in 6 months- MD; labs- cbc/bmp; iron studies/ferritin-- venofer/B12-Dr.B   All questions were answered. The patient knows to call the clinic with any problems, questions or concerns.    Cammie Sickle, MD 11/03/2020 1:41 PM

## 2020-11-03 NOTE — Assessment & Plan Note (Addendum)
#   Iron deficiency anemia-hemoglobin 13; however given recent iron deficiency proceed with Venofer today.   #Etiology- EGD/Colo [Dr.Wohl] June 2021 revealed a total of 4 angiodysplastic lesions that were cauterized with argon laser. Monitor closely.   # Thrombocytosis-446; JAK2 mutation negative; question other MPN Vs-reactive secondary deficiency- check MPL/CALR mutation  # B12 deficiency: Proceed with 1000 mcg intramuscular B12 today  # DISPOSITION: # HOLD venofer today # b12 injection TODAY # follow up in 6 months- MD; labs- cbc/bmp; iron studies/ferritin-- venofer/B12-Dr.B

## 2020-11-14 ENCOUNTER — Encounter: Payer: Self-pay | Admitting: Oncology

## 2020-11-14 LAB — CALRETICULIN (CALR) MUTATION ANALYSIS

## 2020-11-15 ENCOUNTER — Encounter: Payer: Self-pay | Admitting: Oncology

## 2020-11-15 LAB — MPL MUTATION ANALYSIS

## 2020-11-16 ENCOUNTER — Ambulatory Visit: Payer: Self-pay | Admitting: Adult Health

## 2020-11-20 ENCOUNTER — Ambulatory Visit: Payer: Medicare HMO | Admitting: Cardiology

## 2020-11-21 ENCOUNTER — Other Ambulatory Visit: Payer: Self-pay

## 2020-11-21 ENCOUNTER — Ambulatory Visit (INDEPENDENT_AMBULATORY_CARE_PROVIDER_SITE_OTHER): Payer: Medicare Other | Admitting: Family

## 2020-11-21 ENCOUNTER — Encounter: Payer: Self-pay | Admitting: Oncology

## 2020-11-21 ENCOUNTER — Encounter: Payer: Self-pay | Admitting: Family

## 2020-11-21 VITALS — BP 144/92 | HR 98 | Temp 98.1°F | Ht 64.0 in | Wt 195.2 lb

## 2020-11-21 DIAGNOSIS — E1165 Type 2 diabetes mellitus with hyperglycemia: Secondary | ICD-10-CM

## 2020-11-21 DIAGNOSIS — I10 Essential (primary) hypertension: Secondary | ICD-10-CM

## 2020-11-21 DIAGNOSIS — N401 Enlarged prostate with lower urinary tract symptoms: Secondary | ICD-10-CM | POA: Diagnosis not present

## 2020-11-21 DIAGNOSIS — R42 Dizziness and giddiness: Secondary | ICD-10-CM | POA: Diagnosis not present

## 2020-11-21 DIAGNOSIS — R3911 Hesitancy of micturition: Secondary | ICD-10-CM

## 2020-11-21 DIAGNOSIS — M79645 Pain in left finger(s): Secondary | ICD-10-CM | POA: Diagnosis not present

## 2020-11-21 DIAGNOSIS — E785 Hyperlipidemia, unspecified: Secondary | ICD-10-CM | POA: Diagnosis not present

## 2020-11-21 DIAGNOSIS — E1140 Type 2 diabetes mellitus with diabetic neuropathy, unspecified: Secondary | ICD-10-CM

## 2020-11-21 DIAGNOSIS — IMO0002 Reserved for concepts with insufficient information to code with codable children: Secondary | ICD-10-CM

## 2020-11-21 LAB — POCT GLYCOSYLATED HEMOGLOBIN (HGB A1C): Hemoglobin A1C: 7.8 % — AB (ref 4.0–5.6)

## 2020-11-21 LAB — MICROALBUMIN / CREATININE URINE RATIO
Creatinine,U: 112.9 mg/dL
Microalb Creat Ratio: 1 mg/g (ref 0.0–30.0)
Microalb, Ur: 1.1 mg/dL (ref 0.0–1.9)

## 2020-11-21 MED ORDER — SEMAGLUTIDE (2 MG/DOSE) 8 MG/3ML ~~LOC~~ SOPN
2.0000 mg | PEN_INJECTOR | SUBCUTANEOUS | 3 refills | Status: DC
Start: 2020-11-21 — End: 2020-12-02

## 2020-11-21 MED ORDER — TELMISARTAN 20 MG PO TABS: 20.0000 mg | ORAL_TABLET | Freq: Every day | ORAL | 1 refills | Status: DC

## 2020-11-21 MED ORDER — INSULIN GLARGINE 100 UNIT/ML SOLOSTAR PEN: 12.0000 [IU] | PEN_INJECTOR | Freq: Every day | SUBCUTANEOUS | 3 refills | Status: DC

## 2020-11-21 NOTE — Patient Instructions (Addendum)
We will not start blood pressure medication. DO NOT START TELMISARTAN ( this is blood pressure medication)   You are orthostatic and is very important for you to drink water, be very careful with changing of positions .  You may also trial compression stockings as these may be helpful.    Increase ozempic to '2mg'$     Let me know how you are doing.

## 2020-11-21 NOTE — Assessment & Plan Note (Signed)
Lab Results  Component Value Date   HGBA1C 7.8 (A) 11/21/2020   Uncontrolled. Increase ozempic 2 mg. Decrease lantus to 12 units daily. Will consider telmisartan very low dose based on microalbumin urine test.

## 2020-11-21 NOTE — Assessment & Plan Note (Addendum)
He is not dizzy today.  Well-appearing.  Reassuring neurologic exam.  Holter monitor last month with cardiology revealed no arrhythmia.  Orthostatics demonstrate orthostatic hypotension.  Originally I had called in telmisartan for patient to start however this is been canceled. Judson Roch, CMA called pharmacy to cancel as well. Patient informed.  Long-term hearing loss, tinnitus.  Symptoms not entirely consistent with vertigo however consider this on the differential for now. He has ben evaluated by neurology 10/2019.  Encouraged increasing water intake and to be very careful with position changes. Consider ENT consult.  Close follow up.

## 2020-11-21 NOTE — Progress Notes (Signed)
FYI patient called and notified to ONLY take 12 units of Lantus not his current dose of 20 units. Pt verbalized understanding ofd this. Pt also asked could use his 1 mg Ozempic pen & take twice to use up. I told him he could do this, but new dose was sent to his pharmacy.

## 2020-11-21 NOTE — Assessment & Plan Note (Signed)
LDL < 40. Continue atorvastatin '40mg'$ 

## 2020-11-21 NOTE — Progress Notes (Signed)
Subjective:    Patient ID: Benjamin Conley, male    DOB: Jul 02, 1965, 55 y.o.   MRN: [id number]  CC: Benjamin Conley is a 55 y.o. male who presents today for follow up.   HPI:  DM- intolerant to metformin. Increase Ozempic to 1 mg weekly last month. He self reduced reduce Lantus to 20 units once daily.   FBG < 130. Post prandial < 200.  No hypoglycemic episodes.   HTN- no medications at this time due to hypotension. He will get dizzy when laying down and standing up. No palpitations.   Chronic hearing loss, tinnitus. No ear pain, sinus congestion, facial pain, fever, HA, vision loss.   No recurrent syncopal episode.  Drinks 1-2  sixteen ounce glass of water.   HLD- compliant with  atorvastatin 14m  Complains of left thumb trigger finger.  No joint swelling, injury.  No h/o gout  Crt 0.67 8//26/22 CT head 09/23/19 without acute finding.  Mild periventricular chronic white matter disease MRI brain 08/20/19-without acute findings.  Chronic microvascular ischemia 10/30/20 long term holter; occasional paroxymal SVT noted. No significant arrhthymias.   Consult with Dr. PMelrose Nakayama7/14/2021 regards to dizziness and reviewed MRI brain.  He recommended aspirin 81 mg and Plavix 150 mg daily.UKoreacarotid done 10/2019 however cannot see results.    He had been on metoprolol 280m, lisinopril 72m42mn the past.   History of splenic artery intervention-following with Dr. DewLucky Cowboyscular.  Last follow-up May of this year.  Last seen by Dr. EtaCharlestine Nightrch of this year. Compliant with clopidogrel 772m76mSA  81mg76m bleeding    H/o IDA , thrombocytosis and following with Dr BrahmBurlene Arntofer dose held and b12 IM given 11/03/20   Colo/EGD June 2021 Dr Wohl Allen NorrisTORY:  Past Medical History:  Diagnosis Date   Acute posthemorrhagic anemia    Complication of anesthesia    c/o difficulty breathing after anesthesia   COPD (chronic obstructive pulmonary disease) (HCC)    Diabetes mellitus without  complication (HCC) WilseyHyperlipemia    Melena 08/21/2019   Paroxysmal supraventricular tachycardia (HCC) 08/19/2019   Postural dizziness with presyncope 08/21/2019   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;  Service: Endoscopy;  Laterality: N/A;   STENT PLACEMENT VASCULAR ([facility] [place] 07/2019   stenosis of distal splenic artery and stent placed   VISCERAL ANGIOGRAPHY N/A 08/06/2019   Procedure: VISCERAL ANGIOGRAPHY;  Surgeon: SchniKatha Cabal  [facility];  Service: Cardiovascular;  Laterality: N/A;   Family History  Problem Relation Age of Onset   Diabetes Mellitus II Mother    Diabetes Mellitus II Brother    Hyperlipidemia Brother    Tuberculosis Father    Heart disease Father    Cancer Maternal Aunt    Colon cancer Maternal Uncle    Diabetes Sister    Diabetes Sister    Diabetes Sister    Diabetes Sister    Prostate cancer Neg Hx     Allergies: Wasp venom, Wasp venom  protein, Coffee flavor, and Onion Current Outpatient Medications on File Prior to Visit  Medication Sig Dispense Refill   albuterol (VENTOLIN HFA) 108 (90 Base) MCG/ACT inhaler Inhale 2 puffs into the lungs every 6 (six) hours as needed for wheezing or shortness of breath. 6.7 g 3   aspirin 81 MG EC tablet Take 1 tablet (81 mg total) by mouth daily. 90 tablet 3   atorvastatin (LIPITOR) 40 MG tablet Take 1 tablet (40 mg total) by mouth daily. 90 tablet 3   blood glucose meter kit and supplies Dispense based on patient and insurance  preference. Use up to four times daily as directed. (FOR ICD-10 E10.9, E11.9). 1 each 0   clopidogrel (PLAVIX) 75 MG tablet Take 1 tablet (75 mg total) by mouth daily. 90 tablet 3   glucose blood test strip 1 each by Other route in the morning and at bedtime. Use to test blood sugar twice daily. 100 each 3   insulin glargine (LANTUS) 100 UNIT/ML Solostar Pen Inject 30 Units into the skin at bedtime. 15 mL 3   Insulin Pen Needle (PEN NEEDLES) 32G X 4 MM MISC Used to give daily insulin injections. 100 each 3   Insulin Pen Needle 32G X 4 MM MISC Use as directed to administer insulin 50 each 2   nystatin-triamcinolone ointment (MYCOLOG) Apply 1 application topically 2 (two) times daily. 60 g 0   sildenafil (VIAGRA) 100 MG tablet Take 1 tablet (100 mg total) by mouth daily as needed for erectile dysfunction. 30 tablet 0   tamsulosin (FLOMAX) 0.4 MG CAPS capsule Take 1 capsule (0.4 mg total) by mouth daily. 90 capsule 0   Tiotropium Bromide-Olodaterol (STIOLTO RESPIMAT) 2.5-2.5 MCG/ACT AERS Inhale 2 puffs into the lungs daily. 1 each 0   Iron-Vitamin C (VITRON-C) 65-125 MG TABS Take 1 tablet by mouth daily. (Patient not taking: Reported on 11/21/2020) 60 tablet 0   No current facility-administered medications on file prior to visit.    Social History   Tobacco Use   Smoking status: Former    Packs/day: 1.00    Years: 48.00    Pack years: 48.00    Types: Cigarettes    Quit date: 02/12/2020    Years since quitting: 0.7   Smokeless tobacco: Never  Vaping Use   Vaping Use: Never used  Substance Use Topics   Alcohol use: No    Comment: Never been a problem   Drug use: No    Review of Systems  Constitutional:  Negative for chills and fever.  HENT:  Positive for hearing loss. Negative for ear pain, sinus pressure and sinus pain.   Respiratory:  Negative for cough.   Cardiovascular:  Negative for chest pain and palpitations.  Gastrointestinal:  Negative for nausea and vomiting.   Musculoskeletal:  Positive for arthralgias.  Neurological:  Positive for dizziness. Negative for syncope.     Objective:    BP (!) 144/92 (BP Location: Left Arm, Patient Position: Sitting, Cuff Size: Normal)   Pulse 98   Temp 98.1 F (36.7 C) (Oral)   Ht 5' 4"  (1.626 m)   Wt 195 lb 3.2 oz (88.5 kg)   SpO2 99%   BMI 33.51 kg/m  BP Readings from Last 3 Encounters:  11/21/20 (!) 144/92  11/03/20 118/82  07/18/20 103/65   Wt Readings from Last 3 Encounters:  11/21/20 195 lb 3.2 oz (88.5 kg)  11/03/20 195 lb 9.6 oz (88.7 kg)  10/19/20 193 lb (87.5 kg)  Physical Exam Vitals reviewed.  Constitutional:      Appearance: He is well-developed.  Cardiovascular:     Rate and Rhythm: Regular rhythm.     Heart sounds: Normal heart sounds.  Pulmonary:     Effort: Pulmonary effort is normal. No respiratory distress.     Breath sounds: Normal breath sounds. No wheezing, rhonchi or rales.  Skin:    General: Skin is warm and dry.  Neurological:     Mental Status: He is alert.  Psychiatric:        Speech: Speech normal.        Behavior: Behavior normal.       Assessment & Plan:   Problem List Items Addressed This Visit       Cardiovascular and Mediastinum   Hypertension   Relevant Orders   Ambulatory referral to Cardiology   Orthostatic hypotension    He is not dizzy today.  Well-appearing.  Reassuring neurologic exam.  Holter monitor last month with cardiology revealed no arrhythmia.  Orthostatics demonstrate orthostatic hypotension.  Originally I had called in telmisartan for patient to start however this is been canceled. Benjamin Conley, CMA called pharmacy to cancel as well. Patient informed.  Long-term hearing loss, tinnitus.  Symptoms not entirely consistent with vertigo however consider this on the differential for now. He has ben evaluated by neurology 10/2019.  Encouraged increasing water intake and to be very careful with position changes. Consider ENT consult.  Close follow  up.         Endocrine   DM type 2, uncontrolled, with neuropathy (Benjamin Conley) - Primary    Lab Results  Component Value Date   HGBA1C 7.8 (A) 11/21/2020  Uncontrolled. Increase ozempic 2 mg. Decrease lantus to 12 units daily. Will consider telmisartan very low dose based on microalbumin urine test.      Relevant Medications   Semaglutide, 2 MG/DOSE, 8 MG/3ML SOPN   Other Relevant Orders   POCT HgB A1C (Completed)   Microalbumin / creatinine urine ratio     Genitourinary   Benign prostatic hyperplasia with hesitancy   Relevant Orders   Ambulatory referral to Urology     Other   Hyperlipidemia    LDL < 40. Continue atorvastatin 30m      Other Visit Diagnoses     Pain of left thumb       Relevant Orders   Ambulatory referral to Orthopedic Surgery        I have discontinued Benjamin Conley's Ozempic (1 MG/DOSE) and telmisartan. I am also having him start on Semaglutide (2 MG/DOSE). Additionally, I am having him maintain his Stiolto Respimat, sildenafil, Vitron-C, blood glucose meter kit and supplies, insulin glargine, glucose blood, tamsulosin, nystatin-triamcinolone ointment, Insulin Pen Needle, Pen Needles, atorvastatin, clopidogrel, aspirin, and albuterol.   Meds ordered this encounter  Medications   Semaglutide, 2 MG/DOSE, 8 MG/3ML SOPN    Sig: Inject 2 mg as directed once a week.    Dispense:  3 mL    Refill:  3    Order Specific Question:   Supervising Provider    Answer:   TCrecencio Mc[[id number]]   DISCONTD: telmisartan (MICARDIS) 20 MG tablet    Sig: Take 1 tablet (20 mg total) by mouth daily.    Dispense:  90 tablet    Refill:  1    Order Specific Question:   Supervising Provider    Answer:   TCrecencio Mc[[id number]]     Return precautions given.  Risks, benefits, and alternatives of the medications and treatment plan prescribed today were discussed, and patient expressed understanding.   Education regarding symptom management and diagnosis given to  patient on AVS.  Continue to follow with Flinchum, Kelby Aline, [name] for routine health maintenance.   Benjamin Conley and I agreed with plan.   Benjamin Paris, [name]  I have spent 40 minutes with a patient including precharting, exam, reviewing medical records, and discussion plan of care.

## 2020-11-27 ENCOUNTER — Ambulatory Visit (INDEPENDENT_AMBULATORY_CARE_PROVIDER_SITE_OTHER): Payer: Medicare Other

## 2020-11-27 ENCOUNTER — Ambulatory Visit (INDEPENDENT_AMBULATORY_CARE_PROVIDER_SITE_OTHER): Payer: Medicare Other | Admitting: Cardiology

## 2020-11-27 ENCOUNTER — Other Ambulatory Visit: Payer: Self-pay

## 2020-11-27 ENCOUNTER — Encounter: Payer: Self-pay | Admitting: Cardiology

## 2020-11-27 VITALS — BP 122/72 | HR 103 | Ht 64.0 in | Wt 194.0 lb

## 2020-11-27 DIAGNOSIS — I728 Aneurysm of other specified arteries: Secondary | ICD-10-CM | POA: Diagnosis not present

## 2020-11-27 DIAGNOSIS — E1140 Type 2 diabetes mellitus with diabetic neuropathy, unspecified: Secondary | ICD-10-CM

## 2020-11-27 DIAGNOSIS — R55 Syncope and collapse: Secondary | ICD-10-CM | POA: Diagnosis not present

## 2020-11-27 DIAGNOSIS — Z9181 History of falling: Secondary | ICD-10-CM

## 2020-11-27 DIAGNOSIS — E7800 Pure hypercholesterolemia, unspecified: Secondary | ICD-10-CM

## 2020-11-27 DIAGNOSIS — IMO0002 Reserved for concepts with insufficient information to code with codable children: Secondary | ICD-10-CM

## 2020-11-27 NOTE — Progress Notes (Signed)
Cardiology Office Note:    Date:  11/27/2020   ID:  [name], DOB 1965/07/01, MRN [id number]  PCP:  Doreen Beam, [name]  [facility] Cardiologist:  [name], MD  [place] Electrophysiologist:  None   Referring MD: Sharmon Leyden*   Chief Complaint  Patient presents with   Other    6 week follow up post ZIO -- Patient c.o his vertigo acting up. Meds reviewed verbally with patient.     History of Present Illness:    Benjamin Conley is a 55 y.o. male with a hx of diabetes, splenic infarct s/p stent placement, COPD, hyperlipidemia, former smoker x40+ years who presents for follow-up.  He was last seen due to syncopal episodes.  Symptoms were deemed likely vasovagal in etiology as syncope occured in the context of bowel movement and urinating.  Previous echo with no structural abnormalities, EF normal.  Cardiac monitor was placed to evaluate any significant arrhythmias.    Toprol-XL was previously stopped due to low blood pressures.  He saw ENT and was diagnosed with vertigo.  Also was diagnosed with hearing loss, he is currently working on getting a hearing aid.  Has no new concerns, still states having vertigo.  Prior notes Echo 09/6806 normal systolic and diastolic function, EF 55 to 60%. Lexiscan Myoview 08/2019 low risk, no evidence for ischemia, no significant coronary artery calcification  Patient had worsening left upper quadrant abdominal pain.  He presented to the ED  where CT abdomen dated 07/2019 showed distal splenic aneurysm no greater than 6 mm, small dissection, likely source of splenic infarctions.  He was diagnosed with splenic infarct and had a stent placed to the splenic artery.    Denies palpitations or irregular heartbeats.  States his heart rates have been elevated over the past 2 to 3 weeks, up to 120 bpm upon checks at home.   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   Tobacco abuse    Upper GI bleed 08/21/2019    Past Surgical History:  Procedure Laterality Date   BACK SURGERY     lumbar   COLONOSCOPY WITH PROPOFOL N/A 08/24/2019   Procedure: COLONOSCOPY WITH PROPOFOL;  Surgeon: [name], MD;  Location: [facility] ENDOSCOPY;  Service: Endoscopy;  Laterality: N/A;   ESOPHAGOGASTRODUODENOSCOPY (EGD) WITH PROPOFOL N/A 08/24/2019   Procedure: ESOPHAGOGASTRODUODENOSCOPY (EGD) WITH PROPOFOL;  Surgeon: [name], MD;  Location: [facility] ENDOSCOPY;  Service: Endoscopy;  Laterality: N/A;   STENT PLACEMENT VASCULAR ([place] HX)  07/2019   stenosis of distal splenic artery and stent placed   VISCERAL ANGIOGRAPHY N/A 08/06/2019   Procedure: VISCERAL ANGIOGRAPHY;  Surgeon: [name], MD;  Location: [facility] CV LAB;  Service: Cardiovascular;  Laterality: N/A;    Current Medications: Current Meds  Medication Sig   albuterol (VENTOLIN HFA) 108 (90 Base) MCG/ACT inhaler Inhale 2 puffs into the lungs every 6 (six) hours as needed for wheezing or shortness of breath.   aspirin 81 MG EC tablet Take 1 tablet (81 mg total) by mouth daily.   atorvastatin (LIPITOR) 40 MG tablet Take 1 tablet (40 mg total)  by mouth daily.   blood glucose meter kit and supplies Dispense based on patient and insurance preference. Use up to four times daily as directed. (FOR ICD-10 E10.9, E11.9).   clopidogrel (PLAVIX) 75 MG tablet Take 1 tablet (75 mg total) by mouth daily.   glucose blood test strip 1 each by Other route in the morning and at bedtime. Use to test blood sugar twice daily.   insulin glargine (LANTUS) 100 UNIT/ML Solostar Pen Inject 12 Units into  the skin at bedtime.   Insulin Pen Needle (PEN NEEDLES) 32G X 4 MM MISC Used to give daily insulin injections.   Insulin Pen Needle 32G X 4 MM MISC Use as directed to administer insulin   Iron-Vitamin C (VITRON-C) 65-125 MG TABS Take 1 tablet by mouth daily.   nystatin-triamcinolone ointment (MYCOLOG) Apply 1 application topically 2 (two) times daily.   Semaglutide, 2 MG/DOSE, 8 MG/3ML SOPN Inject 2 mg as directed once a week.   sildenafil (VIAGRA) 100 MG tablet Take 1 tablet (100 mg total) by mouth daily as needed for erectile dysfunction.   tamsulosin (FLOMAX) 0.4 MG CAPS capsule Take 1 capsule (0.4 mg total) by mouth daily.   Tiotropium Bromide-Olodaterol (STIOLTO RESPIMAT) 2.5-2.5 MCG/ACT AERS Inhale 2 puffs into the lungs daily.     Allergies:   Wasp venom, Wasp venom protein, Coffee flavor, and Onion   Social History   Socioeconomic History   Marital status: Single    Spouse name: Not on file   Number of children: Not on file   Years of education: Not on file   Highest education level: Not on file  Occupational History   Not on file  Tobacco Use   Smoking status: Former    Packs/day: 1.00    Years: 48.00    Pack years: 48.00    Types: Cigarettes    Quit date: 02/12/2020    Years since quitting: 0.7   Smokeless tobacco: Never  Vaping Use   Vaping Use: Never used  Substance and Sexual Activity   Alcohol use: No    Comment: Never been a problem   Drug use: No   Sexual activity: Yes  Other Topics Concern   Not on file  Social History Narrative   Single, lives alone, unemployed.   Social Determinants of Health   Financial Resource Strain: Low Risk    Difficulty of Paying Living Expenses: Not hard at all  Food Insecurity: No Food Insecurity   Worried About Charity fundraiser in the Last Year: Never true   [place] in the Last Year: Never true  Transportation Needs: No Transportation Needs   Lack of Transportation (Medical): No   Lack of Transportation  (Non-Medical): No  Physical Activity: Insufficiently Active   Days of Exercise per Week: 4 days   Minutes of Exercise per Session: 20 min  Stress: No Stress Concern Present   Feeling of Stress : Not at all  Social Connections: Unknown   Frequency of Communication with Friends and Family: Not on file   Frequency of Social Gatherings with Friends and Family: More than three times a week   Attends Religious Services: Not on Electrical engineer or Organizations: No   Attends Archivist Meetings: Not on file   Marital Status: Not on file     Family History: The patient's family history includes Cancer in his maternal aunt; Colon cancer in his maternal uncle; Diabetes in his sister, sister,  sister, and sister; Diabetes Mellitus II in his brother and mother; Heart disease in his father; Hyperlipidemia in his brother; Tuberculosis in his father. There is no history of Prostate cancer.  ROS:   Please see the history of present illness.     All other systems reviewed and are negative.  EKGs/Labs/Other Studies Reviewed:    The following studies were reviewed today:   EKG:  EKG not ordered today.    Recent Labs: 01/21/2020: ALT 14 11/03/2020: BUN 11; Creatinine, Ser 0.67; Hemoglobin 13.9; Platelets 417; Potassium 4.0; Sodium 137  Recent Lipid Panel    Component Value Date/Time   CHOL 94 01/21/2020 1505   TRIG 98 01/21/2020 1505   HDL 36 (L) 01/21/2020 1505   CHOLHDL 2.6 01/21/2020 1505   VLDL 28.4 10/21/2019 1432   LDLCALC 40 01/21/2020 1505    Physical Exam:    VS:  BP 122/72 (BP Location: Left Arm, Patient Position: Sitting, Cuff Size: Normal)   Pulse (!) 103   Ht $R'5\' 4"'dI$  (1.626 m)   Wt 194 lb (88 kg)   SpO2 98%   BMI 33.30 kg/m     Wt Readings from Last 3 Encounters:  11/27/20 194 lb (88 kg)  11/21/20 195 lb 3.2 oz (88.5 kg)  11/03/20 195 lb 9.6 oz (88.7 kg)     GEN:  Well nourished, well developed in no acute distress HEENT: Normal NECK: No JVD;  No carotid bruits LYMPHATICS: No lymphadenopathy CARDIAC: Tachycardic, regular, no murmurs, rubs, gallops RESPIRATORY:  Clear to auscultation without rales, wheezing or rhonchi  ABDOMEN: Soft, non-tender, non-distended MUSCULOSKELETAL:  No edema; No deformity  SKIN: Warm and dry NEUROLOGIC:  Alert and oriented x 3 PSYCHIATRIC:  Normal affect   ASSESSMENT:    1. Syncope and collapse   2. Pure hypercholesterolemia   3. Splenic artery aneurysm (HCC)     PLAN:    In order of problems listed above:  Patient with syncopal episodes.  Associated with movements, recently diagnosed with vertigo and hearing loss.  Previous.  Cardiac monitor was without significant arrhythmias.  Recommend patient follows up with ENT regarding hearing aid, Antivert may be considered by PCP if appropriate for vertigo.  Echo 1096 normal systolic and diastolic function. Hyperlipidemia, continue Lipitor Splenic artery aneurysm, dissection s/p stent placement.  On aspirin, Plavix, Lipitor.  Keep follow-up appointments with vascular surgery.  Follow-up as needed.  Medication Adjustments/Labs and Tests Ordered: Current medicines are reviewed at length with the patient today.  Concerns regarding medicines are outlined above.  No orders of the defined types were placed in this encounter.  No orders of the defined types were placed in this encounter.   Patient Instructions  Medication Instructions:  Your physician recommends that you continue on your current medications as directed. Please refer to the Current Medication list given to you today.  *If you need a refill on your cardiac medications before your next appointment, please call your pharmacy*   Lab Work: None ordered If you have labs (blood work) drawn today and your tests are completely normal, you will receive your results only by: [place] (if you have MyChart) OR A paper copy in the mail If you have any lab test that is abnormal or we need  to change your treatment, we will call you to review the results.   Testing/Procedures: None ordered   Follow-Up: At [facility], you and your health needs are our priority.  As part of our continuing mission to provide you  with exceptional heart care, we have created designated Provider Care Teams.  These Care Teams include your primary Cardiologist (physician) and Advanced Practice Providers (APPs -  Physician Assistants and Nurse Practitioners) who all work together to provide you with the care you need, when you need it.  We recommend signing up for the patient portal called "MyChart".  Sign up information is provided on this After Visit Summary.  MyChart is used to connect with patients for Virtual Visits (Telemedicine).  Patients are able to view lab/test results, encounter notes, upcoming appointments, etc.  Non-urgent messages can be sent to your provider as well.   To learn more about what you can do with MyChart, go to [email or web address].    Your next appointment:   Follow up as needed   The format for your next appointment:   In Person  Provider:   You may see [name], MD or one of the following Advanced Practice Providers on your designated Care Team:   Murray Hodgkins, NP Christell Faith, PA-C Marrianne Mood, PA-C Cadence Ritchie, [place]   Other Instructions    Signed, [name], MD  11/27/2020 12:43 PM    [place]

## 2020-11-27 NOTE — Chronic Care Management (AMB) (Signed)
Chronic Care Management   CCM RN Visit Note  11/27/2020 Name: Benjamin Conley MRN: [id number] DOB: 04-18-1965  Subjective: Benjamin Conley is a 55 y.o. year old male who is a primary care patient of Flinchum, Kelby Aline, [name]. The care management team was consulted for assistance with disease management and care coordination needs.    Engaged with patient by telephone for follow up visit in response to provider referral for case management and care coordination services.   Consent to Services:  The patient was given information about Chronic Care Management services, agreed to services, and gave verbal consent prior to initiation of services.  Please see initial visit note for detailed documentation.    Assessment: Review of patient past medical history, allergies, medications, health status, including review of consultants reports, laboratory and other test data, was performed as part of comprehensive evaluation and provision of chronic care management services.   SDOH (Social Determinants of Health) assessments and interventions performed: No  CCM Care Plan  Allergies  Allergen Reactions   Wasp Venom Anaphylaxis   Wasp Venom Protein Anaphylaxis   Coffee Flavor Nausea And Vomiting    Patient states coffee gives him nausea and stomach cramps   Onion Nausea And Vomiting    Outpatient Encounter Medications as of 11/27/2020  Medication Sig   albuterol (VENTOLIN HFA) 108 (90 Base) MCG/ACT inhaler Inhale 2 puffs into the lungs every 6 (six) hours as needed for wheezing or shortness of breath.   aspirin 81 MG EC tablet Take 1 tablet (81 mg total) by mouth daily.   atorvastatin (LIPITOR) 40 MG tablet Take 1 tablet (40 mg total) by mouth daily.   blood glucose meter kit and supplies Dispense based on patient and insurance preference. Use up to four times daily as directed. (FOR ICD-10 E10.9, E11.9).   clopidogrel (PLAVIX) 75 MG tablet Take 1 tablet (75 mg total) by mouth daily.    glucose blood test strip 1 each by Other route in the morning and at bedtime. Use to test blood sugar twice daily.   insulin glargine (LANTUS) 100 UNIT/ML Solostar Pen Inject 12 Units into the skin at bedtime.   Insulin Pen Needle (PEN NEEDLES) 32G X 4 MM MISC Used to give daily insulin injections.   Insulin Pen Needle 32G X 4 MM MISC Use as directed to administer insulin   Iron-Vitamin C (VITRON-C) 65-125 MG TABS Take 1 tablet by mouth daily.   nystatin-triamcinolone ointment (MYCOLOG) Apply 1 application topically 2 (two) times daily.   Semaglutide, 2 MG/DOSE, 8 MG/3ML SOPN Inject 2 mg as directed once a week.   sildenafil (VIAGRA) 100 MG tablet Take 1 tablet (100 mg total) by mouth daily as needed for erectile dysfunction.   tamsulosin (FLOMAX) 0.4 MG CAPS capsule Take 1 capsule (0.4 mg total) by mouth daily.   Tiotropium Bromide-Olodaterol (STIOLTO RESPIMAT) 2.5-2.5 MCG/ACT AERS Inhale 2 puffs into the lungs daily.   No facility-administered encounter medications on file as of 11/27/2020.    Patient Active Problem List   Diagnosis Date Noted   Screening for blood or protein in urine 06/16/2020   Contact dermatitis 06/16/2020   Leukocytosis 01/30/2020   Nicotine use disorder 01/27/2020   Slow transit constipation 01/27/2020   Nail avulsion of toe, initial encounter 12/09/2019   Benign prostatic hyperplasia with hesitancy 10/30/2019   Abnormal ejaculation 10/21/2019   B12 deficiency 10/21/2019   Rectal pressure 10/21/2019   Thrombosis of splenic artery (HCC) 10/03/2019   Abnormal brain MRI  09/22/2019   Difficulty sleeping 09/22/2019   Tinnitus of both ears 09/22/2019   Pain due to onychomycosis of toenails of both feet 09/06/2019   DM type 2, uncontrolled, with neuropathy ([place]) 09/06/2019   Coagulation disorder ([place]) 09/06/2019   Iron deficiency anemia due to chronic blood loss 08/29/2019   Thrombocytosis 08/24/2019   Angiodysplasia of intestinal tract    Black stool 08/22/2019    Presence of arterial stent - splenic artery 08/21/2019   Orthostatic hypotension 08/19/2019   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   COPD with chronic bronchitis ([place]) 08/01/2019   Tobacco abuse 08/01/2019   Diabetes mellitus without complication ([place])    Hyperlipidemia    Impingement syndrome of shoulder region 03/30/2018   Hypertension 11/10/2013    Conditions to be addressed/monitored:DMII and Fall Risk Patient Care Plan: Diabetes Type 2 (Adult)     Problem Identified: Disease Progression (Diabetes, Type 2)      Long-Range Goal: Disease Progression Prevented or Minimized   Start Date: 11/27/2020  Expected End Date: 02/25/2021  Priority: High  Note:    Lab Results  Component Value Date   HGBA1C 7.8 (A) 11/21/2020    Current Barriers:  Chronic Disease Management support and educational needs related to Diabetes self-management.  Case Manager Clinical Goal(s):  Over the next 90 days, patient will demonstrate improved adherence to prescribed treatment plan for diabetes self care/management as evidenced by: taking medications as prescribed, daily monitoring and recording CBG's and adherence to a ADA/ carb modified diet.  Interventions:  Collaboration with Flinchum, Kelby Aline, [name] regarding development and update of comprehensive plan of care as evidenced by provider attestation and co-signature Inter-disciplinary care team collaboration (see longitudinal plan of care) Reviewed medications. Reports taking as prescribed. Expressed concerns regarding Ozempic. Reports completing and returning the required documentation for medication assistance. Reports the dose was recently increased. He is using his current pen but will require a refill sooner than expected due to dose increase. Agreed to contact he clinic or care management team if he is unable to obtain it from [facility]. Discussed importance of monitoring blood glucose consistently and maintaining a log. Discussed s/sx of  hypoglycemia and hyperglycemia along with recommended interventions. Denies symptoms. Reports still occasionally consuming high carb foods and concentrated sugars but feels his diet continues to improve. Declined need for additional dietary resources. Reviewed indications for notifying a provider and seeking medical attention for consistently elevated levels.    Patient Goals/Self-Care Activities Self-administer medications as prescribed Attend all scheduled provider appointments Monitor blood glucose levels consistently and utilize recommended interventions Adhere to prescribed ADA/carb modified Update team if additional assistance is required with obtaining Ozempic Notify provider or care management team with questions and new concerns as needed     Follow Up Will follow up in two months.    Patient Care Plan: Fall Risk (Adult)     Problem Identified: Fall Risk      Long-Range Goal: Absence of Fall and Fall-Related Injury   Start Date: 11/27/2020  Expected End Date: 01/26/2021  Priority: High  Note:   Current Barriers:  Risk for Falls d/t Impaired Gait r/t Joint Pain  Clinical Goal(s):  Over the next 60 days, patient will not experience falls or require hospitalization due to fall related injuries. Reviewed medications and discussed potential side effects such as dizziness and lightheadedness. Provided information regarding safety and fall prevention. Discussed increased risk for falls d/t joint pain and hx of sciatica to right side. Advised to avoid changing positions quickly,  prolonged standing, or bending and standing quickly. Advised to complete follow up as scheduled to determine if medical interventions or therapy is required. Discussed ability to perform ADL's and tasks in the home. Reports performing tasks independently. Denies current need for additional in-home assistance.    Self-Care Deficits/Patient Goals:  Utilize assistive device appropriately with all  ambulation Ensure pathways are clear and well lit Change positions slowly and use caution when ambulating Wear secure fitting, skid free footwear when ambulating Update the care management team with changes in functional status Notify provider or care management team for questions and new concerns as needed    Follow Up Plan:  Will follow up in two months.            PLAN A member of the care management team will follow up in two months   [facility] Care [facility] [phone]

## 2020-11-27 NOTE — Patient Instructions (Signed)
Medication Instructions:  Your physician recommends that you continue on your current medications as directed. Please refer to the Current Medication list given to you today.  *If you need a refill on your cardiac medications before your next appointment, please call your pharmacy*   Lab Work: None ordered If you have labs (blood work) drawn today and your tests are completely normal, you will receive your results only by: [place] (if you have MyChart) OR A paper copy in the mail If you have any lab test that is abnormal or we need to change your treatment, we will call you to review the results.   Testing/Procedures: None ordered   Follow-Up: At [facility], you and your health needs are our priority.  As part of our continuing mission to provide you with exceptional heart care, we have created designated Provider Care Teams.  These Care Teams include your primary Cardiologist (physician) and Advanced Practice Providers (APPs -  Physician Assistants and Nurse Practitioners) who all work together to provide you with the care you need, when you need it.  We recommend signing up for the patient portal called "MyChart".  Sign up information is provided on this After Visit Summary.  MyChart is used to connect with patients for Virtual Visits (Telemedicine).  Patients are able to view lab/test results, encounter notes, upcoming appointments, etc.  Non-urgent messages can be sent to your provider as well.   To learn more about what you can do with MyChart, go to [email or web address].    Your next appointment:   Follow up as needed   The format for your next appointment:   In Person  Provider:   You may see [name], MD or one of the following Advanced Practice Providers on your designated Care Team:   Murray Hodgkins, NP Christell Faith, PA-C Marrianne Mood, PA-C Cadence Kathlen Mody, [place]   Other Instructions

## 2020-11-27 NOTE — Patient Instructions (Addendum)
Thank you for allowing the Chronic Care Management team to participate in your care.    Patient Care Plan: Diabetes Type 2 (Adult)     Problem Identified: Disease Progression (Diabetes, Type 2)      Long-Range Goal: Disease Progression Prevented or Minimized   Start Date: 11/27/2020  Expected End Date: 02/25/2021  Priority: High  Note:    Lab Results  Component Value Date   HGBA1C 7.8 (A) 11/21/2020    Current Barriers:  Chronic Disease Management support and educational needs related to Diabetes self-management.  Case Manager Clinical Goal(s):  Over the next 90 days, patient will demonstrate improved adherence to prescribed treatment plan for diabetes self care/management as evidenced by: taking medications as prescribed, daily monitoring and recording CBG's and adherence to a ADA/ carb modified diet.  Interventions:  Collaboration with Flinchum, Kelby Aline, [name] regarding development and update of comprehensive plan of care as evidenced by provider attestation and co-signature Inter-disciplinary care team collaboration (see longitudinal plan of care) Reviewed medications. Reports taking as prescribed. Expressed concerns regarding Ozempic. Reports completing and returning the required documentation for medication assistance. Reports the dose was recently increased. He is using his current pen but will require a refill sooner than expected due to dose increase. Agreed to contact he clinic or care management team if he is unable to obtain it from [place]. Discussed importance of monitoring blood glucose consistently and maintaining a log. Discussed s/sx of hypoglycemia and hyperglycemia along with recommended interventions. Denies symptoms. Reports still occasionally consuming high carb foods and concentrated sugars but feels his diet continues to improve. Declined need for additional dietary resources. Reviewed indications for notifying a provider and seeking medical attention  for consistently elevated levels.    Patient Goals/Self-Care Activities Self-administer medications as prescribed Attend all scheduled provider appointments Monitor blood glucose levels consistently and utilize recommended interventions Adhere to prescribed ADA/carb modified Update team if additional assistance is required with obtaining Ozempic Notify provider or care management team with questions and new concerns as needed     Follow Up Will follow up in two months.    Patient Care Plan: Fall Risk (Adult)     Problem Identified: Fall Risk      Long-Range Goal: Absence of Fall and Fall-Related Injury   Start Date: 11/27/2020  Expected End Date: 01/26/2021  Priority: High  Note:   Current Barriers:  Risk for Falls d/t Impaired Gait r/t Joint Pain  Clinical Goal(s):  Over the next 60 days, patient will not experience falls or require hospitalization due to fall related injuries. Reviewed medications and discussed potential side effects such as dizziness and lightheadedness. Provided information regarding safety and fall prevention. Discussed increased risk for falls d/t joint pain and hx of sciatica to right side. Advised to avoid changing positions quickly, prolonged standing, or bending and standing quickly. Advised to complete follow up as scheduled to determine if medical interventions or therapy is required. Discussed ability to perform ADL's and tasks in the home. Reports performing tasks independently. Denies current need for additional in-home assistance.    Self-Care Deficits/Patient Goals:  Utilize assistive device appropriately with all ambulation Ensure pathways are clear and well lit Change positions slowly and use caution when ambulating Wear secure fitting, skid free footwear when ambulating Update the care management team with changes in functional status Notify provider or care management team for questions and new concerns as needed    Follow Up  Plan:  Will follow up in two months.  Mr. Kyllonen verbalized understanding of the information discussed during the telephonic outreach. Declined need for mailed/printed instructions. A member of the care management team will follow up in two months   [facility] [phone]

## 2020-11-28 ENCOUNTER — Telehealth: Payer: Self-pay | Admitting: Adult Health

## 2020-11-28 NOTE — Telephone Encounter (Signed)
Patient dropped a letter from his insurance company about his Ozempic . Letter is upfront in Michelle's color folder.

## 2020-11-28 NOTE — Telephone Encounter (Signed)
Picked up letter from provider folder up front. Letter states pts Ozempic 8mg /78ml is not covered in their drug plan list. Alternatives suggested were Victoza, Ozempic 4mg /5OI, Trulicity, Bydureon and Byetta. Letter states we can make an exception by providing info on why its medically necessary to treat this pts condition because the plan's covered drugs would not work as well for the pt.Please advise.

## 2020-12-01 NOTE — Telephone Encounter (Signed)
Pt is agreeable to try Victoza.

## 2020-12-02 ENCOUNTER — Other Ambulatory Visit: Payer: Self-pay | Admitting: Family

## 2020-12-02 MED ORDER — VICTOZA 18 MG/3ML ~~LOC~~ SOPN: PEN_INJECTOR | SUBCUTANEOUS | 0 refills | Status: DC

## 2020-12-04 DIAGNOSIS — M65312 Trigger thumb, left thumb: Secondary | ICD-10-CM | POA: Diagnosis not present

## 2020-12-04 DIAGNOSIS — E1142 Type 2 diabetes mellitus with diabetic polyneuropathy: Secondary | ICD-10-CM | POA: Diagnosis not present

## 2020-12-05 ENCOUNTER — Telehealth: Payer: Self-pay | Admitting: Pharmacist

## 2020-12-05 ENCOUNTER — Ambulatory Visit: Payer: Medicare Other | Admitting: Pharmacist

## 2020-12-05 DIAGNOSIS — IMO0002 Reserved for concepts with insufficient information to code with codable children: Secondary | ICD-10-CM

## 2020-12-05 DIAGNOSIS — E785 Hyperlipidemia, unspecified: Secondary | ICD-10-CM

## 2020-12-05 DIAGNOSIS — I10 Essential (primary) hypertension: Secondary | ICD-10-CM

## 2020-12-05 DIAGNOSIS — E1140 Type 2 diabetes mellitus with diabetic neuropathy, unspecified: Secondary | ICD-10-CM

## 2020-12-05 MED ORDER — SEMAGLUTIDE (2 MG/DOSE) 8 MG/3ML ~~LOC~~ SOPN: 2.0000 mg | PEN_INJECTOR | SUBCUTANEOUS | 1 refills | Status: DC

## 2020-12-05 NOTE — Addendum Note (Signed)
Addended by: [name] on: 12/05/2020 01:45 PM   Modules accepted: Orders

## 2020-12-05 NOTE — Telephone Encounter (Signed)
Patient was prescribed Victoza and he is currently on Lantus Solstar.   Patient wants to know if he should be taking both medications?

## 2020-12-05 NOTE — Telephone Encounter (Signed)
PA for Ozempic 2 mg approved through 03/10/21.

## 2020-12-05 NOTE — Telephone Encounter (Signed)
Returned call. Patient was able to fill a 1 month supply of the Ozempic 2 mg, but continued fills require authorization. Given improved clinical benefit of Ozempic vs Victoza, I would recommend we attempt authorization of Ozempic 2 mg dose rather than switch to Victoza.   Advised patient to start on Ozempic 2 mg weekly supply that he has.   PA submitted for Ozempic 2 mg via Cover My Meds noting a therapeutic failure of Ozempic 4 mg/3 mL, as he did not achieve A1c control on that dose. ([id number])

## 2020-12-05 NOTE — Patient Instructions (Signed)
Visit Information  PATIENT GOALS:  Goals Addressed               This Visit's Progress     Patient Stated     Disease Progression Prevention (pt-stated)        Patient Goals/Self-Care Activities Over the next 90 days, patient will:  - take medications as prescribed check blood glucose daily, document, and provide at future appointments check blood pressure daily, document, and provide at future appointments         Patient verbalizes understanding of instructions provided today and agrees to view in [place].   Plan: Telephone follow up appointment with care management team member scheduled for:  ~3 weeks  Catie Darnelle Maffucci, PharmD, [place], [place] Clinical Pharmacist [employer] [facility] [employer] [phone]

## 2020-12-05 NOTE — Chronic Care Management (AMB) (Addendum)
Chronic Care Management Pharmacy Note  12/05/2020 Name:  Benjamin Conley MRN:  [id number] DOB:  [birth year]  Subjective: Benjamin Conley is an 55 y.o. year old male who is a primary patient of Flinchum, Kelby Aline, [name].  Collaborating with supervising provider Deborra Medina while PCP is on leave. The CCM team was consulted for assistance with disease management and care coordination needs.    Engaged with patient by telephone for follow up visit in response to provider referral for pharmacy case management and/or care coordination services.   Consent to Services:  The patient was given information about Chronic Care Management services, agreed to services, and gave verbal consent prior to initiation of services.  Please see initial visit note for detailed documentation.   Patient Care Team: Doreen Beam, [name] as PCP - General (Family Medicine) [name], MD as PCP - Cardiology (Cardiology) Lloyd Huger, MD as Consulting Physician (Oncology) Neldon Labella, RN as Case Manager [name], RPH-CPP as Pharmacist (Pharmacist)  Objective:  Lab Results  Component Value Date   CREATININE 0.67 11/03/2020   CREATININE 0.66 (L) 01/21/2020   CREATININE 0.70 10/21/2019    Lab Results  Component Value Date   HGBA1C 7.8 (A) 11/21/2020   Last diabetic Eye exam:  Lab Results  Component Value Date/Time   HMDIABEYEEXA No Retinopathy 02/21/2020 12:00 AM    Last diabetic Foot exam: No results found for: HMDIABFOOTEX      Component Value Date/Time   CHOL 94 01/21/2020 1505   TRIG 98 01/21/2020 1505   HDL 36 (L) 01/21/2020 1505   CHOLHDL 2.6 01/21/2020 1505   VLDL 28.4 10/21/2019 1432   [place] 40 01/21/2020 1505    Hepatic Function Latest Ref Rng & Units 01/21/2020 09/06/2019 08/21/2019  Total Protein 6.1 - 8.1 g/dL 6.8 7.4 7.6  Albumin 3.5 - 5.2 g/dL - 4.5 3.8  AST 10 - 35 U/L 9(L) 11 15  ALT 9 - 46 U/L 14 17 21   Alk Phosphatase 39 - 117 U/L -  79 78  Total Bilirubin 0.2 - 1.2 mg/dL 0.3 0.3 0.6    Lab Results  Component Value Date/Time   TSH 1.68 08/20/2019 02:52 PM    CBC Latest Ref Rng & Units 11/03/2020 06/30/2020 03/29/2020  WBC 4.0 - 10.5 K/uL 7.1 8.0 8.7  Hemoglobin 13.0 - 17.0 g/dL 13.9 13.1 11.8(L)  Hematocrit 39.0 - 52.0 % 42.1 39.4 35.7(L)  Platelets 150 - 400 K/uL 417(H) 426(H) 503(H)    Lab Results  Component Value Date/Time   VD25OH 13 (L) 01/21/2020 03:05 PM    Social History   Tobacco Use  Smoking Status Former   Packs/day: 1.00   Years: 48.00   Pack years: 48.00   Types: Cigarettes   Quit date: 02/12/2020   Years since quitting: 0.8  Smokeless Tobacco Never   BP Readings from Last 3 Encounters:  11/27/20 122/72  11/21/20 (!) 144/92  11/03/20 118/82   Pulse Readings from Last 3 Encounters:  11/27/20 (!) 103  11/21/20 98  11/03/20 (!) 101   Wt Readings from Last 3 Encounters:  11/27/20 194 lb (88 kg)  11/21/20 195 lb 3.2 oz (88.5 kg)  11/03/20 195 lb 9.6 oz (88.7 kg)    Assessment: Review of patient past medical history, allergies, medications, health status, including review of consultants reports, laboratory and other test data, was performed as part of comprehensive evaluation and provision of chronic care management services.   SDOH:  (Social Determinants  of Health) assessments and interventions performed:  SDOH Interventions    Flowsheet Row Most Recent Value  SDOH Interventions   Financial Strain Interventions Intervention Not Indicated       CCM Care Plan  Allergies  Allergen Reactions   Wasp Venom Anaphylaxis   Wasp Venom Protein Anaphylaxis   Coffee Flavor Nausea And Vomiting    Patient states coffee gives him nausea and stomach cramps   Onion Nausea And Vomiting    Medications Reviewed Today     Reviewed by Neldon Labella, RN (Registered Nurse) on 11/27/20 at 1508  Med List Status: <None>   Medication Order Taking? Sig Documenting Provider Last Dose Status  Informant  albuterol (VENTOLIN HFA) 108 (90 Base) MCG/ACT inhaler [id number] No Inhale 2 puffs into the lungs every 6 (six) hours as needed for wheezing or shortness of breath. Crecencio Mc, MD Taking Active   aspirin 81 MG EC tablet [id number] No Take 1 tablet (81 mg total) by mouth daily. Crecencio Mc, MD Taking Active   atorvastatin (LIPITOR) 40 MG tablet [id number] No Take 1 tablet (40 mg total) by mouth daily. Crecencio Mc, MD Taking Active   blood glucose meter kit and supplies [id number] No Dispense based on patient and insurance preference. Use up to four times daily as directed. (FOR ICD-10 E10.9, E11.9). [name], NP Taking Active   clopidogrel (PLAVIX) 75 MG tablet [id number] No Take 1 tablet (75 mg total) by mouth daily. Crecencio Mc, MD Taking Active   glucose blood test strip [id number] No 1 each by Other route in the morning and at bedtime. Use to test blood sugar twice daily. Flinchum, Kelby Aline, [name] Taking Active   insulin glargine (LANTUS) 100 UNIT/ML Solostar Pen [id number] No Inject 12 Units into the skin at bedtime. Burnard Hawthorne, [name] Taking Active   Insulin Pen Needle (PEN NEEDLES) 32G X 4 MM MISC [id number] No Used to give daily insulin injections. Flinchum, Kelby Aline, [name] Taking Active   Insulin Pen Needle 32G X 4 MM MISC [id number] No Use as directed to administer insulin Flinchum, Kelby Aline, [name] Taking Active   Iron-Vitamin C (VITRON-C) 65-125 MG TABS [id number] No Take 1 tablet by mouth daily. [name], NP Taking Active   nystatin-triamcinolone ointment [facility]) [id number] No Apply 1 application topically 2 (two) times daily. Flinchum, Kelby Aline, [name] Taking Active   Semaglutide, 2 MG/DOSE, 8 MG/3ML SOPN [id number] No Inject 2 mg as directed once a week. Burnard Hawthorne, [name] Taking Active   sildenafil (VIAGRA) 100 MG tablet [id number] No Take 1 tablet (100 mg total) by mouth daily as needed for erectile dysfunction. [name], PA-C Taking  Active   tamsulosin (FLOMAX) 0.4 MG CAPS capsule [id number] No Take 1 capsule (0.4 mg total) by mouth daily. Flinchum, Kelby Aline, [name] Taking Active   Tiotropium Bromide-Olodaterol (STIOLTO RESPIMAT) 2.5-2.5 MCG/ACT AERS [id number] No Inhale 2 puffs into the lungs daily. [name], MD Taking Active            Med Note Rowe Pavy Nov 27, 2020  9:36 AM)              Patient Active Problem List   Diagnosis Date Noted   Screening for blood or protein in urine 06/16/2020   Contact dermatitis 06/16/2020   Leukocytosis 01/30/2020   Nicotine use disorder 01/27/2020   Slow transit constipation 01/27/2020   Nail avulsion of toe, initial encounter 12/09/2019  Benign prostatic hyperplasia with hesitancy 10/30/2019   Abnormal ejaculation 10/21/2019   B12 deficiency 10/21/2019   Rectal pressure 10/21/2019   Thrombosis of splenic artery (HCC) 10/03/2019   Abnormal brain MRI 09/22/2019   Difficulty sleeping 09/22/2019   Tinnitus of both ears 09/22/2019   Pain due to onychomycosis of toenails of both feet 09/06/2019   DM type 2, uncontrolled, with neuropathy ([place]) 09/06/2019   Coagulation disorder ([place]) 09/06/2019   Iron deficiency anemia due to chronic blood loss 08/29/2019   Thrombocytosis 08/24/2019   Angiodysplasia of intestinal tract    Black stool 08/22/2019   Presence of arterial stent - splenic artery 08/21/2019   Orthostatic hypotension 08/19/2019   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   COPD with chronic bronchitis ([place]) 08/01/2019   Tobacco abuse 08/01/2019   Diabetes mellitus without complication ([place])    Hyperlipidemia    Impingement syndrome of shoulder region 03/30/2018   Hypertension 11/10/2013    Immunization History  Administered Date(s) Administered   Td 03/11/2004   Tdap 08/02/2014    Conditions to be addressed/monitored: DMII and HTN, HLD,   Care Plan : Medication Management  Updates made by [name], RPH-CPP since 12/05/2020 12:00  AM     Problem: Diabetes, Tobacco Abuse, Splenic Infarct, Tachycardia      Long-Range Goal: Disease Progression Prevention   This Visit's Progress: On track  Recent Progress: On track  Priority: High  Note:   Current Barriers:  Unable to independently monitor therapeutic efficacy Unable to achieve control of diabetes  Suboptimal cardiovascular risk reduction  Pharmacist Clinical Goal(s):  Over the next 90 days, patient will achieve adherence to monitoring guidelines and medication adherence to achieve therapeutic efficacy. Over the next 90 days, patient will achieve control of diabetes as evidenced by improvement in A1c through collaboration with PharmD and provider.   Interventions: 1:1 collaboration with covering provider, Deborra Medina, MD regarding development and update of comprehensive plan of care as evidenced by provider attestation and co-signature Inter-disciplinary care team collaboration (see longitudinal plan of care) Comprehensive medication review performed; medication list updated in electronic medical record Diabetes: Uncontrolled but improving; current treatment: Ozempic 2 mg weekly, Lantus 20 units daily; picked up Victoza but has not started yet. Was able to fill a 1 month supply of Ozempic 2 mg Hx metformin - could not tolerate d/t diarrhea, even XR formulation. Received notice from his insurance that Ozempic 2 mg is not on formulary. A script for Victoza was sent in, however, this is unlikely to provide the glycemic control that this patient needs as he was uncontrolled on Ozempic 1 mg weekly. Completed PA for Ozempic 2 mg dose. PA approved. D/c order for Victoza and continue Ozempic 2 mg weekly. Script sent to the pharmacy.  Hypertension: Controlled per home readings; current regimen: none  Hx metoprolol - d/c d/t hypotension Previously recommended to continue current regimen at this time  Hyperlipidemia, hx splenic infarct Controlled per last lipid panel;  current treatment: atorvastatin 40 mg daily - though reports he ran out Antiplatelet regimen: aspirin 81 mg daily - though reports he has not been taking because he ran out, clopidogrel 75  mg daily  Previously recommended to continue current regimen at this time  Chronic Obstructive Pulmonary Disease, hx Tobacco Abuse Confirms avoidance of tobacco products since 02/12/20. Denies use of either Stiolto 2.5/2.5 mcg 2 puffs daily or albuterol HFA rescue inhaler recently.  Previously recommended to continue current regimen  Iron Deficiency Anemia: Managed by hematology; recent Venofer  infusion; reports he has not been taking the iron + Vitamin C supplement at home.  Previously recommended to continue current regimen  BPH: Controlled per patient report; current regimen: tamsulosin 0.4 mg daily; follows w/ urology [place] Previously recommended to continue current regimen at this time   Patient Goals/Self-Care Activities Over the next 90 days, patient will:  - take medications as prescribed check blood glucose daily, document, and provide at future appointments check blood pressure daily, document, and provide at future appointments   Follow Up Plan: Telephone follow up appointment with care management team member scheduled for: 3 weeks     Medication Assistance: None required.  Patient affirms current coverage meets needs.  Patient's preferred pharmacy is:  [facility] [place], [place] - [address] [place] [address] Phone: [phone] Fax: [phone]  [facility] [address] [place] - [place] [address] Phone: [phone] Fax: [phone]  [id number] - [place], [place] - [place] [address] Phone: [phone] Fax: [phone]  [place], [place] - [place]  [address] Phone: [phone] Fax: [phone]   Follow Up:  Patient agrees to Care Plan and Follow-up.  Plan: Telephone follow up appointment with care management team member scheduled for:  ~3 weeks  Catie Darnelle Maffucci, PharmD, [place], [place] Clinical Pharmacist [employer] at [employer] [phone]

## 2020-12-08 DIAGNOSIS — I10 Essential (primary) hypertension: Secondary | ICD-10-CM

## 2020-12-08 DIAGNOSIS — E1140 Type 2 diabetes mellitus with diabetic neuropathy, unspecified: Secondary | ICD-10-CM

## 2020-12-08 DIAGNOSIS — E1165 Type 2 diabetes mellitus with hyperglycemia: Secondary | ICD-10-CM

## 2020-12-08 DIAGNOSIS — E785 Hyperlipidemia, unspecified: Secondary | ICD-10-CM

## 2020-12-12 ENCOUNTER — Telehealth: Payer: Self-pay

## 2020-12-12 NOTE — Telephone Encounter (Signed)
Placed call to pt to verify pharmacy. Pt asked if I could let Catie know that he received the same letter he did before stating his ozempic wasn't covered.   Note from 9/20 stating what the letter said: "Picked up letter from provider folder up front. Letter states pts Ozempic 8mg /41ml is not covered in their drug plan list. Alternatives suggested were Victoza, Ozempic 4mg /8JG, Trulicity, Bydureon and Byetta. Letter states we can make an exception by providing info on why its medically necessary to treat this pts condition because the plan's covered drugs would not work as well for the pt.Please advise."

## 2020-12-13 NOTE — Telephone Encounter (Signed)
Talked to patient. Reviewed that PA was approved 12/05/20-03/10/21. Advised as such. His letter informing of that may still be in the mail.   He also asked about a handicap sticker. Notes he left paperwork for that several weeks ago. Routing to CMA to advise.

## 2020-12-14 DIAGNOSIS — M25511 Pain in right shoulder: Secondary | ICD-10-CM | POA: Diagnosis not present

## 2020-12-14 NOTE — Telephone Encounter (Signed)
Spoke with pt. Pt is requesting handicap placard due to him being disabled cause of his back. Pt states hes seen an orthopedist. Form has been filled out and placed in provider folder to sign.

## 2020-12-15 ENCOUNTER — Telehealth: Payer: Self-pay

## 2020-12-15 NOTE — Telephone Encounter (Signed)
Nvm the letter from [facility] is old & dated 11/22/20. I just received two days ago however. You can disregard.

## 2020-12-15 NOTE — Telephone Encounter (Signed)
I received a letter from [facility] that patient had been given a 30 day of the Ozempic 8mg /22mL bc it is not on drug formulary for Medicare part D. It seems however that the Ozempic 4mg /40mL is formulary? Should I just move forward & try to appeal this? It looks like in notes that PA was already done & approved? I am confused? I will hold paperwork until I hear.

## 2020-12-18 NOTE — Telephone Encounter (Signed)
noted 

## 2020-12-19 ENCOUNTER — Other Ambulatory Visit: Payer: Self-pay | Admitting: Adult Health

## 2020-12-19 DIAGNOSIS — M25511 Pain in right shoulder: Secondary | ICD-10-CM | POA: Diagnosis not present

## 2020-12-19 DIAGNOSIS — E538 Deficiency of other specified B group vitamins: Secondary | ICD-10-CM

## 2020-12-22 DIAGNOSIS — M75101 Unspecified rotator cuff tear or rupture of right shoulder, not specified as traumatic: Secondary | ICD-10-CM | POA: Diagnosis not present

## 2020-12-26 ENCOUNTER — Ambulatory Visit (INDEPENDENT_AMBULATORY_CARE_PROVIDER_SITE_OTHER): Payer: Medicare Other | Admitting: Pharmacist

## 2020-12-26 DIAGNOSIS — E785 Hyperlipidemia, unspecified: Secondary | ICD-10-CM

## 2020-12-26 DIAGNOSIS — E119 Type 2 diabetes mellitus without complications: Secondary | ICD-10-CM

## 2020-12-26 DIAGNOSIS — D735 Infarction of spleen: Secondary | ICD-10-CM

## 2020-12-26 NOTE — Telephone Encounter (Signed)
Form given to provider to sign.

## 2020-12-26 NOTE — Chronic Care Management (AMB) (Signed)
Chronic Care Management Pharmacy Note  12/26/2020 Name:  Benjamin Conley MRN:  [id number] DOB:  September 19, 1965   Subjective: Benjamin Conley is an 55 y.o. year old male who is a primary patient of Flinchum, Kelby Aline, [name].  Collaborating with covering provider [name], NP who sees the patient in a few weeks. The CCM team was consulted for assistance with disease management and care coordination needs.    Engaged with patient by telephone for follow up visit in response to provider referral for pharmacy case management and/or care coordination services.   Consent to Services:  The patient was given information about Chronic Care Management services, agreed to services, and gave verbal consent prior to initiation of services.  Please see initial visit note for detailed documentation.   Patient Care Team: Doreen Beam, [name] as PCP - General (Family Medicine) [name], MD as PCP - Cardiology (Cardiology) Lloyd Huger, MD as Consulting Physician (Oncology) Neldon Labella, RN as Case Manager [name], RPH-CPP as Pharmacist (Pharmacist)   Objective:  Lab Results  Component Value Date   CREATININE 0.67 11/03/2020   CREATININE 0.66 (L) 01/21/2020   CREATININE 0.70 10/21/2019    Lab Results  Component Value Date   HGBA1C 7.8 (A) 11/21/2020   Last diabetic Eye exam:  Lab Results  Component Value Date/Time   HMDIABEYEEXA No Retinopathy 02/21/2020 12:00 AM    Last diabetic Foot exam: No results found for: HMDIABFOOTEX      Component Value Date/Time   CHOL 94 01/21/2020 1505   TRIG 98 01/21/2020 1505   HDL 36 (L) 01/21/2020 1505   CHOLHDL 2.6 01/21/2020 1505   VLDL 28.4 10/21/2019 1432   LDLCALC 40 01/21/2020 1505    Hepatic Function Latest Ref Rng & Units 01/21/2020 09/06/2019 08/21/2019  Total Protein 6.1 - 8.1 g/dL 6.8 7.4 7.6  Albumin 3.5 - 5.2 g/dL - 4.5 3.8  AST 10 - 35 U/L 9(L) 11 15  ALT 9 - 46 U/L _0 Alk  Phosphatase 39 - 117 U/L - 79 78  Total Bilirubin 0.2 - 1.2 mg/dL 0.3 0.3 0.6    Lab Results  Component Value Date/Time   TSH 1.68 08/20/2019 02:52 PM    CBC Latest Ref Rng & Units 11/03/2020 06/30/2020 03/29/2020  WBC 4.0 - 10.5 K/uL 7.1 8.0 8.7  Hemoglobin 13.0 - 17.0 g/dL 13.9 13.1 11.8(L)  Hematocrit 39.0 - 52.0 % 42.1 39.4 35.7(L)  Platelets 150 - 400 K/uL 417(H) 426(H) 503(H)    Lab Results  Component Value Date/Time   VD25OH 13 (L) 01/21/2020 03:05 PM     Social History   Tobacco Use  Smoking Status Former   Packs/day: 1.00   Years: 48.00   Pack years: 48.00   Types: Cigarettes   Quit date: 02/12/2020   Years since quitting: 0.8  Smokeless Tobacco Never   BP Readings from Last 3 Encounters:  11/27/20 122/72  11/21/20 (!) 144/92  11/03/20 118/82   Pulse Readings from Last 3 Encounters:  11/27/20 (!) 103  11/21/20 98  11/03/20 (!) 101   Wt Readings from Last 3 Encounters:  11/27/20 194 lb (88 kg)  11/21/20 195 lb 3.2 oz (88.5 kg)  11/03/20 195 lb 9.6 oz (88.7 kg)    Assessment: Review of patient past medical history, allergies, medications, health status, including review of consultants reports, laboratory and other test data, was performed as part of comprehensive evaluation and provision of chronic care management  services.   SDOH:  (Social Determinants of Health) assessments and interventions performed:  SDOH Interventions    Flowsheet Row Most Recent Value  SDOH Interventions   Financial Strain Interventions Intervention Not Indicated       CCM Care Plan  Allergies  Allergen Reactions   Wasp Venom Anaphylaxis   Wasp Venom Protein Anaphylaxis   Coffee Flavor Nausea And Vomiting    Patient states coffee gives him nausea and stomach cramps   Onion Nausea And Vomiting    Medications Reviewed Today     Reviewed by [name], RPH-CPP (Pharmacist) on 12/26/20 at [facility] List Status: <None>   Medication Order Taking? Sig  Documenting Provider Last Dose Status Informant  albuterol (VENTOLIN HFA) 108 (90 Base) MCG/ACT inhaler [id number] No Inhale 2 puffs into the lungs every 6 (six) hours as needed for wheezing or shortness of breath.  Patient not taking: Reported on 12/26/2020   Crecencio Mc, MD Not Taking Active   aspirin 81 MG EC tablet [id number] Yes Take 1 tablet (81 mg total) by mouth daily. Crecencio Mc, MD Taking Active   atorvastatin (LIPITOR) 40 MG tablet [id number] Yes Take 1 tablet (40 mg total) by mouth daily. Crecencio Mc, MD Taking Active   blood glucose meter kit and supplies [id number] Yes Dispense based on patient and insurance preference. Use up to four times daily as directed. (FOR ICD-10 E10.9, E11.9). [name], NP Taking Active   clopidogrel (PLAVIX) 75 MG tablet [id number] Yes Take 1 tablet (75 mg total) by mouth daily. Crecencio Mc, MD Taking Active   glucose blood test strip [id number] Yes 1 each by Other route in the morning and at bedtime. Use to test blood sugar twice daily. Flinchum, Kelby Aline, [name] Taking Active   insulin glargine (LANTUS) 100 UNIT/ML Solostar Pen [id number] Yes Inject 12 Units into the skin at bedtime. Burnard Hawthorne, [name] Taking Active            Med Note Darnelle Maffucci, [place] Dec 26, 2020  9:07 AM) 13 units daily  Insulin Pen Needle (PEN NEEDLES) 32G X 4 MM MISC [id number]  Used to give daily insulin injections. Flinchum, Kelby Aline, [name]  Active   Insulin Pen Needle 32G X 4 MM MISC [id number]  Use as directed to administer insulin Flinchum, Kelby Aline, [name]  Active   Iron-Vitamin C (VITRON-C) 65-125 MG TABS [id number]  Take 1 tablet by mouth daily. [name], NP  Active   nystatin-triamcinolone ointment [facility]) [id number]  Apply 1 application topically 2 (two) times daily. Flinchum, Kelby Aline, [name]  Active   Semaglutide, 2 MG/DOSE, 8 MG/3ML SOPN [id number] Yes Inject 2 mg as directed once a week. Crecencio Mc, MD Taking Active   sildenafil  (VIAGRA) 100 MG tablet [id number]  Take 1 tablet (100 mg total) by mouth daily as needed for erectile dysfunction. Zara Council A, PA-C  Active   tamsulosin (FLOMAX) 0.4 MG CAPS capsule [id number] Yes Take 1 capsule (0.4 mg total) by mouth daily. Flinchum, Kelby Aline, [name] Taking Active   Tiotropium Bromide-Olodaterol (STIOLTO RESPIMAT) 2.5-2.5 MCG/ACT AERS [id number] No Inhale 2 puffs into the lungs daily.  Patient not taking: Reported on 12/26/2020   [name], MD Not Taking Active            Med Note Rowe Pavy Nov 27, 2020  9:36 AM)  Patient Active Problem List   Diagnosis Date Noted   Screening for blood or protein in urine 06/16/2020   Contact dermatitis 06/16/2020   Leukocytosis 01/30/2020   Nicotine use disorder 01/27/2020   Slow transit constipation 01/27/2020   Nail avulsion of toe, initial encounter 12/09/2019   Benign prostatic hyperplasia with hesitancy 10/30/2019   Abnormal ejaculation 10/21/2019   B12 deficiency 10/21/2019   Rectal pressure 10/21/2019   Thrombosis of splenic artery (HCC) 10/03/2019   Abnormal brain MRI 09/22/2019   Difficulty sleeping 09/22/2019   Tinnitus of both ears 09/22/2019   Pain due to onychomycosis of toenails of both feet 09/06/2019   DM type 2, uncontrolled, with neuropathy 09/06/2019   Coagulation disorder (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   COPD with chronic bronchitis ([place]) 08/01/2019   Tobacco abuse 08/01/2019   Diabetes mellitus without complication ([place])    Hyperlipidemia    Impingement syndrome of shoulder region 03/30/2018   Hypertension 11/10/2013    Immunization History  Administered Date(s) Administered   Td 03/11/2004    Tdap 08/02/2014    Conditions to be addressed/monitored: CAD and DMII  Care Plan : Medication Management  Updates made by [name], RPH-CPP since 12/26/2020 12:00 AM     Problem: Diabetes, Tobacco Abuse, Splenic Infarct, Tachycardia      Long-Range Goal: Disease Progression Prevention   This Visit's Progress: On track  Recent Progress: On track  Priority: High  Note:   Current Barriers:  Unable to independently monitor therapeutic efficacy Unable to achieve control of diabetes  Suboptimal cardiovascular risk reduction  Pharmacist Clinical Goal(s):  Over the next 90 days, patient will achieve adherence to monitoring guidelines and medication adherence to achieve therapeutic efficacy. Over the next 90 days, patient will achieve control of diabetes as evidenced by improvement in A1c through collaboration with PharmD and provider.   Interventions: 1:1 collaboration with covering provider, Deborra Medina, MD regarding development and update of comprehensive plan of care as evidenced by provider attestation and co-signature Inter-disciplinary care team collaboration (see longitudinal plan of care) Comprehensive medication review performed; medication list updated in electronic medical record  Health Maintenance   Yearly diabetic eye exam: up to date Yearly diabetic foot exam: up to date Urine microalbumin: up to date Yearly influenza vaccination: due - recommended to pursue Td/Tdap vaccination: up to date Pneumonia vaccination: up to date COVID vaccinations: due - patient previously declined Shingrix vaccinations: due - patient previously declined Colonoscopy: up to date   Diabetes: Uncontrolled but improving; current treatment: Ozempic 2 mg weekly, Lantus 13-15 units daily;  Reports a steroid dose pack from [facility] last week that resulted in glucose elevations Hx metformin - could not tolerate d/t diarrhea, even XR formulation. Current glucose readings:  fastings: 100-130s; 2 hour post prandial (lunch and supper): 140-200s Denies hypoglycemia.  Current meal patterns: breakfast: eats a couple of eggs, sausage, maybe a biscuit or toast. Sometimes skips breakfast. Lunch: sometimes hamburger, sometimes skips; supper: mac and cheese, fried chicken; drinks: water, some lipton tea recently Discussed post prandial elevations likely related to dietary choices. Would be cautious about adding SGLT2 given baseline hypotension/dizziness. Discussed initiation of CGM to help monitor dietary choices. Patient amenable to investigating insurance coverage. Order for [place] 2 +  reader sent to [facility] via [facility]. Will follow for outcome  Hypertension: Controlled per home readings; current regimen: none  Hx metoprolol - d/c d/t hypotension Does report some lightheadedness if he stands up too quickly Previously recommended to continue current regimen at this time  Hyperlipidemia, hx splenic infarct Controlled per last lipid panel; current treatment: atorvastatin 40 mg daily Antiplatelet regimen: aspirin 81 mg daily - though reports he has not been taking because he ran out and can't find any at [facility]; clopidogrel 75 mg daily  Contacted [facility]. They will put a bottle of OTC aspirin in the patient's back for him to pick up.  Recommended to continue current regimen at this time  Chronic Obstructive Pulmonary Disease, hx Tobacco Abuse Confirms avoidance of tobacco products since 02/12/20. Denies use of either Stiolto 2.5/2.5 mcg 2 puffs daily or albuterol HFA rescue inhaler recently.  Previously recommended to continue current regimen  Iron Deficiency Anemia: Managed by hematology; recent Venofer infusion; reports he has not been taking the iron + Vitamin C supplement at home.  Previously recommended to continue current regimen. Discuss need for iron supplement w/ hematology moving forward   BPH: Controlled per patient report; current  regimen: tamsulosin 0.4 mg daily; follows w/ urology [place] Previously recommended to continue current regimen at this time  GERD: Does report more belching, acid reflux recently. Has been using Tums.  Advised he could try OTC famotidine. Advised he schedule follow up with GI Dr. Allen Norris given hx possible GI related anemia, along with risk factor for GI concerns (DAPT therapy, hx tobacco use). Patient verbalized understanding.    Patient Goals/Self-Care Activities Over the next 90 days, patient will:  - take medications as prescribed check blood glucose daily, document, and provide at future appointments check blood pressure daily, document, and provide at future appointments   Follow Up Plan: Telephone follow up appointment with care management team member scheduled for: pending CGM access      Medication Assistance: None required.  Patient affirms current coverage meets needs.  Patient's preferred pharmacy is:  [facility] [place], [place] - [address] [place] [address] Phone: [phone] Fax: [phone]  [facility] [address] [place] - [place] [address] Phone: [phone] Fax: [phone]  [id number] - [place], [place] - [place] [address] Phone: [phone] Fax: [phone]  [place], [place] - [place] [address] Phone: [phone] Fax: [phone]   Follow Up:  Patient agrees to Care Plan and Follow-up.  Plan: Telephone follow up appointment with care management team member scheduled for:  pending CGM access  Catie Darnelle Maffucci, PharmD, [place], [place] Clinical Pharmacist [employer] at [facility] [phone]

## 2020-12-26 NOTE — Telephone Encounter (Signed)
Patient asked for an updated on the handicap placard.

## 2020-12-26 NOTE — Patient Instructions (Signed)
Benjamin Conley,   It is normal for steroids to raise your blood sugar. We'll see if your insurance will cover the Libre CGM to help keep a better watch on your blood sugars.   Call Dr. Dorothey Baseman office for follow up about your indigestion. Let us know if you need a new referral. In the meantime, you can try over the counter famotidine (Pepcid) once daily.   I called [facility] and they are going to get a bottle of aspirin 81 mg ready for you.   Take care!  Catie Darnelle Maffucci, PharmD  Visit Information  PATIENT GOALS:  Goals Addressed               This Visit's Progress     Patient Stated     Disease Progression Prevention (pt-stated)        Patient Goals/Self-Care Activities Over the next 90 days, patient will:  - take medications as prescribed check blood glucose daily, document, and provide at future appointments check blood pressure daily, document, and provide at future appointments        The patient verbalized understanding of instructions, educational materials, and care plan provided today and agreed to receive a mailed copy of patient instructions, educational materials, and care plan.   Plan: Telephone follow up appointment with care management team member scheduled for:  pending CGM access  Catie Darnelle Maffucci, PharmD, [place], [place] Clinical Pharmacist [employer] at [facility] [phone]

## 2020-12-27 NOTE — Telephone Encounter (Signed)
Form placed up front for pt to pick up

## 2021-01-01 ENCOUNTER — Ambulatory Visit: Payer: Medicare Other | Admitting: Adult Health

## 2021-01-01 DIAGNOSIS — E1165 Type 2 diabetes mellitus with hyperglycemia: Secondary | ICD-10-CM | POA: Diagnosis not present

## 2021-01-03 ENCOUNTER — Telehealth: Payer: Self-pay | Admitting: Adult Health

## 2021-01-03 NOTE — Telephone Encounter (Signed)
The patient received his freestyle Libre in the mail and he is ready to set up an appointment .

## 2021-01-08 DIAGNOSIS — E785 Hyperlipidemia, unspecified: Secondary | ICD-10-CM

## 2021-01-08 DIAGNOSIS — E119 Type 2 diabetes mellitus without complications: Secondary | ICD-10-CM | POA: Diagnosis not present

## 2021-01-10 ENCOUNTER — Ambulatory Visit: Payer: Medicare Other | Admitting: Adult Health

## 2021-01-12 ENCOUNTER — Telehealth: Payer: Self-pay | Admitting: Family

## 2021-01-12 ENCOUNTER — Other Ambulatory Visit: Payer: Self-pay

## 2021-01-12 ENCOUNTER — Telehealth: Payer: Self-pay | Admitting: Adult Health

## 2021-01-12 ENCOUNTER — Ambulatory Visit (INDEPENDENT_AMBULATORY_CARE_PROVIDER_SITE_OTHER): Payer: Medicare Other | Admitting: Family

## 2021-01-12 VITALS — BP 122/74 | HR 118 | Temp 97.9°F | Wt 190.0 lb

## 2021-01-12 DIAGNOSIS — E119 Type 2 diabetes mellitus without complications: Secondary | ICD-10-CM

## 2021-01-12 DIAGNOSIS — Z794 Long term (current) use of insulin: Secondary | ICD-10-CM

## 2021-01-12 DIAGNOSIS — I951 Orthostatic hypotension: Secondary | ICD-10-CM | POA: Diagnosis not present

## 2021-01-12 DIAGNOSIS — K219 Gastro-esophageal reflux disease without esophagitis: Secondary | ICD-10-CM

## 2021-01-12 MED ORDER — SEMAGLUTIDE (2 MG/DOSE) 8 MG/3ML ~~LOC~~ SOPN: 2.0000 mg | PEN_INJECTOR | SUBCUTANEOUS | 1 refills | Status: DC

## 2021-01-12 MED ORDER — INSULIN GLARGINE 100 UNIT/ML SOLOSTAR PEN: 9.0000 [IU] | PEN_INJECTOR | Freq: Every day | SUBCUTANEOUS | 3 refills | Status: DC

## 2021-01-12 MED ORDER — PANTOPRAZOLE SODIUM 20 MG PO TBEC: 20.0000 mg | DELAYED_RELEASE_TABLET | Freq: Every day | ORAL | 0 refills | Status: DC

## 2021-01-12 NOTE — Assessment & Plan Note (Signed)
Uncontrolled.  Discussed mechanism of action as it relates to Ozempic as suspect contributory to symptoms. Advised to follow satiety cues.  He will start Protonix 20 mg 1 hour before breakfast approximately 30 to 60 days to see if symptoms improve.  Discussed ultimately wean off and use as needed.Close follow up

## 2021-01-12 NOTE — Patient Instructions (Addendum)
Decrease lantus to 9 units as we discussed STOP ALL sugary drinks.   Bring blood sugar log by the office in 1 weeks time including fasting and postprandial.  Be sure that postprandial blood glucose is 2 hours after a meal.  You may choose your largest meal of the day to include postprandial.  Please ensure you continue to follow-up with ENT. I placed a referral to nutritionist. Let us know if you dont hear back within a week in regards to an appointment being scheduled.

## 2021-01-12 NOTE — Progress Notes (Signed)
Subjective:    Patient ID: Benjamin Conley, male    DOB: December 13, 1965, 55 y.o.   MRN: [id number]  CC: Benjamin Conley is a 55 y.o. male who presents today for follow up.   HPI: Complains of belching and burning in his throat with bad taste since starting ozempic. He has been taking ozempic. Tums with some relief. No abdominal pian, vomiting, fever, dysuria.     Here for diabetic follow-up He ate 2-3 cookies 2 hours ago. Breakfast was 2 biscuit and sausage. Usually eats one meal per day, largest meal is supper, such as 6 slices of pizza or 2 sandwiches. Drinks water and lemon flavored tea with sugar ( 4 glasses).    He is noted discrepancy between his Elenor Legato and when he does point-of-care glucose.  Approximately 50 points. He is using regular glucometer now.  He denies any hypoglycemic episodes.FGB today 149. FBG ranges from 158, 135, 107, 170, 149. He has been less consistent with post prandial,reports 158, 116.   DM- compliant with Ozempic 2 mg weekly for 8 weeks, Lantus 12 units daily.  No blurry vision.   Dizziness is unchanged. Last syncopal episode 1 year ago after using the bathroom. No cp, sob, vertigo. Drinking plenty of water.  He states he has upcoming ENT appointment.  Consult with cardiology 11/27/2020 for syncope and collapse whom advised follow-up with ENT regarding hearing aid, Antivert may be considered appropriate  HISTORY:  Past Medical History:  Diagnosis Date   Acute posthemorrhagic anemia    Complication of anesthesia    c/o difficulty breathing after anesthesia   COPD (chronic obstructive pulmonary disease) ([place])    Diabetes mellitus without complication ([place])    Hyperlipemia    Melena 08/21/2019   Paroxysmal supraventricular tachycardia (HCC) 08/19/2019   Postural dizziness with presyncope 08/21/2019   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   Tobacco abuse    Upper GI bleed 08/21/2019   Past Surgical History:  Procedure Laterality Date   BACK SURGERY     lumbar    COLONOSCOPY WITH PROPOFOL N/A 08/24/2019   Procedure: COLONOSCOPY WITH PROPOFOL;  Surgeon: [name], MD;  Location: [facility] ENDOSCOPY;  Service: Endoscopy;  Laterality: N/A;   ESOPHAGOGASTRODUODENOSCOPY (EGD) WITH PROPOFOL N/A 08/24/2019   Procedure: ESOPHAGOGASTRODUODENOSCOPY (EGD) WITH PROPOFOL;  Surgeon: [name], MD;  Location: [facility] ENDOSCOPY;  Service: Endoscopy;  Laterality: N/A;   STENT PLACEMENT VASCULAR ([place] [facility])  07/2019   stenosis of distal splenic artery and stent placed   VISCERAL ANGIOGRAPHY N/A 08/06/2019   Procedure: VISCERAL ANGIOGRAPHY;  Surgeon: [name], MD;  Location: [facility];  Service: Cardiovascular;  Laterality: N/A;   Family History  Problem Relation Age of Onset   Diabetes Mellitus II Mother    Diabetes Mellitus II Brother    Hyperlipidemia Brother    Tuberculosis Father    Heart disease Father    Cancer Maternal Aunt    Colon cancer Maternal Uncle    Diabetes Sister    Diabetes Sister    Diabetes Sister    Diabetes Sister    Prostate cancer Neg Hx     Allergies: Wasp venom, Wasp venom protein, Coffee flavor, and Onion Current Outpatient Medications on File Prior to Visit  Medication Sig Dispense Refill   aspirin 81 MG EC tablet Take 1 tablet (81 mg total) by mouth daily. 90 tablet 3   atorvastatin (LIPITOR) 40 MG tablet Take 1 tablet (40 mg total) by mouth daily. 90 tablet 3  blood glucose meter kit and supplies Dispense based on patient and insurance preference. Use up to four times daily as directed. (FOR ICD-10 E10.9, E11.9). 1 each 0   clopidogrel (PLAVIX) 75 MG tablet Take 1 tablet (75 mg total) by mouth daily. 90 tablet 3   glucose blood test strip 1 each by Other route in the morning and at bedtime. Use to test blood sugar twice daily. 100 each 3   glucose blood test strip True Metrix Glucose Test Strip  USE 1 STRIP TO CHECK GLUCOSE TWICE DAILY AS DIRECTED     Insulin Pen Needle (PEN NEEDLES) 32G X 4 MM MISC Used to give  daily insulin injections. 100 each 3   Insulin Pen Needle 32G X 4 MM MISC Use as directed to administer insulin 50 each 2   Iron-Vitamin C (VITRON-C) 65-125 MG TABS Take 1 tablet by mouth daily. 60 tablet 0   nystatin-triamcinolone ointment (MYCOLOG) Apply 1 application topically 2 (two) times daily. 60 g 0   sildenafil (VIAGRA) 100 MG tablet Take 1 tablet (100 mg total) by mouth daily as needed for erectile dysfunction. 30 tablet 0   tamsulosin (FLOMAX) 0.4 MG CAPS capsule Take 1 capsule (0.4 mg total) by mouth daily. 90 capsule 0   Tiotropium Bromide-Olodaterol (STIOLTO RESPIMAT) 2.5-2.5 MCG/ACT AERS Inhale 2 puffs into the lungs daily. 1 each 0   albuterol (VENTOLIN HFA) 108 (90 Base) MCG/ACT inhaler Inhale 2 puffs into the lungs every 6 (six) hours as needed for wheezing or shortness of breath. (Patient not taking: Reported on 01/12/2021) 6.7 g 3   No current facility-administered medications on file prior to visit.    Social History   Tobacco Use   Smoking status: Former    Packs/day: 1.00    Years: 48.00    Pack years: 48.00    Types: Cigarettes    Quit date: 02/12/2020    Years since quitting: 0.9   Smokeless tobacco: Never  Vaping Use   Vaping Use: Never used  Substance Use Topics   Alcohol use: No    Comment: Never been a problem   Drug use: No    Review of Systems  Constitutional:  Negative for chills and fever.  HENT:  Negative for congestion, ear pain, rhinorrhea, sinus pressure and sore throat.   Eyes:  Negative for visual disturbance.  Respiratory:  Negative for cough, shortness of breath and wheezing.   Cardiovascular:  Negative for chest pain and palpitations.  Gastrointestinal:  Negative for diarrhea, nausea and vomiting.  Genitourinary:  Negative for dysuria.  Musculoskeletal:  Negative for myalgias.  Skin:  Negative for rash.  Neurological:  Positive for dizziness. Negative for syncope and headaches.  Hematological:  Negative for adenopathy.     Objective:     BP 122/74 (BP Location: Left Arm, Patient Position: Sitting, Cuff Size: Large)   Pulse (!) 118   Temp 97.9 F (36.6 C) (Oral)   Wt 190 lb (86.2 kg)   SpO2 98%   BMI 32.61 kg/m  BP Readings from Last 3 Encounters:  01/12/21 122/74  11/27/20 122/72  11/21/20 (!) 144/92   Wt Readings from Last 3 Encounters:  01/12/21 190 lb (86.2 kg)  11/27/20 194 lb (88 kg)  11/21/20 195 lb 3.2 oz (88.5 kg)    Physical Exam Vitals reviewed.  Constitutional:      Appearance: Normal appearance. He is well-developed.  Cardiovascular:     Rate and Rhythm: Regular rhythm.     Heart sounds:  Normal heart sounds.  Pulmonary:     Effort: Pulmonary effort is normal. No respiratory distress.     Breath sounds: Normal breath sounds. No wheezing, rhonchi or rales.  Abdominal:     General: Bowel sounds are normal. There is no distension.     Palpations: Abdomen is soft. Abdomen is not rigid. There is no fluid wave or mass.     Tenderness: There is no abdominal tenderness. There is no guarding or rebound. Negative signs include Murphy's sign and McBurney's sign.  Skin:    General: Skin is warm and dry.  Neurological:     Mental Status: He is alert.  Psychiatric:        Speech: Speech normal.        Behavior: Behavior normal.       Assessment & Plan:   Problem List Items Addressed This Visit       Cardiovascular and Mediastinum   Orthostatic hypotension    Chronic, stable.  Reviewed cardiology consult with patient today.  He states he has an upcoming ENT appointment.  He will let me know of any new concerns        Digestive   GERD (gastroesophageal reflux disease)    Uncontrolled.  Discussed mechanism of action as it relates to Ozempic as suspect contributory to symptoms. Advised to follow satiety cues.  He will start Protonix 20 mg 1 hour before breakfast approximately 30 to 60 days to see if symptoms improve.  Discussed ultimately wean off and use as needed.Close follow up       Relevant Medications   pantoprazole (PROTONIX) 20 MG tablet     Endocrine   Diabetes mellitus without complication ([place]) - Primary    Lab Results  Component Value Date   HGBA1C 7.8 (A) 11/21/2020  Uncontrolled however fasting blood glucose readings appear to be improving.  Elenor Legato has been inaccurate ( with lag) and I provided patient with blood glucose sheets for which he can fill out return to my office next week to ensure accuracy, safety.  Discussed at length his indiscretion with sweet drinks, lemonade,  approximately 4-day.  Patient is motivated to stop drinking lemonade, and in that setting we jointly agreed to primitively decrease Lantus from 12 units to 9 units and continue Ozempic 2 mg.  Referral to nutritionist.  Close follow-up in 5 weeks      Relevant Medications   insulin glargine (LANTUS) 100 UNIT/ML Solostar Pen   Other Relevant Orders   Referral to Nutrition and Diabetes Services     I have changed Matthewjames R. Feeny's insulin glargine. I am also having him start on pantoprazole. Additionally, I am having him maintain his Stiolto Respimat, sildenafil, Vitron-C, blood glucose meter kit and supplies, glucose blood, tamsulosin, nystatin-triamcinolone ointment, Insulin Pen Needle, Pen Needles, atorvastatin, clopidogrel, aspirin, albuterol, and glucose blood.   Meds ordered this encounter  Medications   pantoprazole (PROTONIX) 20 MG tablet    Sig: Take 1 tablet (20 mg total) by mouth daily. One hour before breakfast    Dispense:  90 tablet    Refill:  0    Order Specific Question:   Supervising Provider    Answer:   Deborra Medina L [[id number]]   insulin glargine (LANTUS) 100 UNIT/ML Solostar Pen    Sig: Inject 9 Units into the skin at bedtime.    Dispense:  15 mL    Refill:  3    Discontinue metformin from list he could not tolerate.  Order Specific Question:   Supervising Provider    Answer:   Crecencio Mc [[id number]]    Return precautions given.   Risks, benefits, and  alternatives of the medications and treatment plan prescribed today were discussed, and patient expressed understanding.   Education regarding symptom management and diagnosis given to patient on AVS.  Continue to follow with Flinchum, Kelby Aline, [name] for routine health maintenance.   Ericberto Rennie Natter and I agreed with plan.   [name], [name]

## 2021-01-12 NOTE — Telephone Encounter (Signed)
Benjamin Conley,  I need to delete this encounter as it wrong encounter.  This encounter is erroneous.

## 2021-01-12 NOTE — Assessment & Plan Note (Signed)
Lab Results  Component Value Date   HGBA1C 7.8 (A) 11/21/2020   Uncontrolled however fasting blood glucose readings appear to be improving.  Benjamin Conley has been inaccurate ( with lag) and I provided patient with blood glucose sheets for which he can fill out return to my office next week to ensure accuracy, safety.  Discussed at length his indiscretion with sweet drinks, lemonade,  approximately 4-day.  Patient is motivated to stop drinking lemonade, and in that setting we jointly agreed to primitively decrease Lantus from 12 units to 9 units and continue Ozempic 2 mg.  Referral to nutritionist.  Close follow-up in 5 weeks

## 2021-01-12 NOTE — Telephone Encounter (Signed)
Sherry from [facility] in [place] called in today to let us know that the prescription ozempic 2mg  medication that was order in for pt is actually on backorder.

## 2021-01-12 NOTE — Telephone Encounter (Signed)
Wrong encounter Deleted

## 2021-01-12 NOTE — Telephone Encounter (Signed)
Call patient I consulted with Dr Derrel Nip, supervising physician.  I would like to confirm with patient that Coumadin dose was not decreased preemptively ahead of doxycycline.  In other words was Dr Caryl Bis aware that he was given doxycycline, if not, as his INR is low I would advise increasing Coumadin to 6 mg daily from 5mg .  Please let me know if patient agreeable.  He would need INR checked in 1 week as we discussed.  Please order and schedule.  I will send coumadin 6mg  qd once you speak to patient

## 2021-01-12 NOTE — Assessment & Plan Note (Signed)
Chronic, stable.  Reviewed cardiology consult with patient today.  He states he has an upcoming ENT appointment.  He will let me know of any new concerns

## 2021-01-12 NOTE — Telephone Encounter (Signed)
Just FYI this on back order indefinitely. We did give him a sample in office though. Not sure what should be done if they still do not have after he uses up pen?

## 2021-01-16 NOTE — Telephone Encounter (Signed)
Catie,  What about if we move to [facility] for next fill?  Do you have a conversion chart when moving between GLP 1 agonists and GIP/GLP1 agonists?  Would I start with mounjaro 2.5mg  all over again?  Fyi sarah

## 2021-01-18 NOTE — Telephone Encounter (Signed)
Called patient. He has 3 weeks remaining on the Ozempic 2 mg pen sample that we provided him. He also reports that [facility] called him yesterday and they think they will be able to order Ozempic 2 mg in the next few days.   Advised him to call me if they have not been able to get it for him in 2 weeks and we can discuss sending to a [facility] for mail order or [facility]) that does have in stock.

## 2021-01-22 NOTE — Telephone Encounter (Signed)
noted 

## 2021-01-30 ENCOUNTER — Encounter: Payer: Medicare Other | Attending: Adult Health | Admitting: *Deleted

## 2021-01-30 ENCOUNTER — Encounter: Payer: Self-pay | Admitting: *Deleted

## 2021-01-30 ENCOUNTER — Other Ambulatory Visit: Payer: Self-pay

## 2021-01-30 ENCOUNTER — Encounter: Payer: Self-pay | Admitting: Adult Health

## 2021-01-30 VITALS — BP 118/70 | Ht 64.0 in | Wt 191.2 lb

## 2021-01-30 DIAGNOSIS — E1165 Type 2 diabetes mellitus with hyperglycemia: Secondary | ICD-10-CM | POA: Insufficient documentation

## 2021-01-30 DIAGNOSIS — Z794 Long term (current) use of insulin: Secondary | ICD-10-CM | POA: Diagnosis not present

## 2021-01-30 NOTE — Patient Instructions (Signed)
Check blood sugars 4 x day before each meal and before bed every day and as needed with FreeStyle Libre Bring blood sugar records to the next appointment  Exercise:  Begin walking  for    10-15  minutes   3  days a week and gradually increase to 30 minutes 5 x week  Eat 3 meals day,   1-2  snacks a day Space meals 4-6 hours apart Don't skip meals Allow 2-3 hours between meals and snacks  Carry fast acting glucose and a snack at all times Rotate injection sites Hold insulin pen in place for 5-10 seconds after injection  Return for appointment on:  Wednesday March 21, 2021 at 1:30 pm with [facility] (dietitian)

## 2021-01-30 NOTE — Progress Notes (Signed)
Diabetes Self-Management Education  Visit Type: First/Initial  Appt. Start Time: 1325 Appt. End Time: 1435  01/30/2021  [name], identified by name and date of birth, is a 55 y.o. male with a diagnosis of Diabetes: Type 2.   ASSESSMENT  Blood pressure 118/70, height 5\' 4"  (1.626 m), weight 191 lb 3.2 oz (86.7 kg). Body mass index is 32.82 kg/m.   Diabetes Self-Management Education - 01/30/21 1612       Visit Information   Visit Type First/Initial      Initial Visit   Diabetes Type Type 2    Are you currently following a meal plan? No    Are you taking your medications as prescribed? Yes    Date Diagnosed > 10 years ago      Health Coping   How would you rate your overall health? Poor      Psychosocial Assessment   Patient Belief/Attitude about Diabetes Other (comment)   "It doesn't bother me"   Self-care barriers None    Self-management support Doctor's office;Family    Patient Concerns Nutrition/Meal planning;Medication;Glycemic Control;Weight Control    Special Needs None    Preferred Learning Style Auditory;Hands on    Learning Readiness Contemplating    How often do you need to have someone help you when you read instructions, pamphlets, or other written materials from your doctor or pharmacy? 1 - Never    What is the last grade level you completed in school? 10th      Pre-Education Assessment   Patient understands the diabetes disease and treatment process. Needs Review    Patient understands incorporating nutritional management into lifestyle. Needs Instruction    Patient undertands incorporating physical activity into lifestyle. Needs Instruction    Patient understands using medications safely. Needs Review    Patient understands monitoring blood glucose, interpreting and using results Needs Review    Patient understands prevention, detection, and treatment of acute complications. Needs Instruction    Patient understands prevention, detection, and  treatment of chronic complications. Needs Review    Patient understands how to develop strategies to address psychosocial issues. Needs Review    Patient understands how to develop strategies to promote health/change behavior. Needs Review      Complications   Last HgB A1C per patient/outside source 7.8 %   11/21/2020   How often do you check your blood sugar? > 4 times/day   FreeStyle Libre CGM. 30 and 90 day average BG - 190 mg/dL Time in Target 48%, Above 51%, Below 1%   Number of hypoglycemic episodes per month 2    Can you tell when your blood sugar is low? Yes   Pt reports he thought these were falst readings because he checked on his meter and readings were 100's mg/dL.   What do you do if your blood sugar is low? eat some peanut butter    Have you had a dilated eye exam in the past 12 months? Yes    Have you had a dental exam in the past 12 months? Yes    Are you checking your feet? Yes    How many days per week are you checking your feet? 1      Dietary Intake   Breakfast cereal and milk; waffles; eggs, cheese, grits, sausage, wheat toast    Lunch skips or eats lunch meat sandwich with cheese    Snack (afternoon) jello, pudding, grapes    Dinner sometimes just eats protein (chicken, beef, pork, fish); peas,  corn, rice, pasta, potatoes, bread - eats only few vegetables raw - carrots, tomatoes, lettuce, brussels sprouts    Beverage(s) water      Exercise   Exercise Type ADL's      Patient Education   Previous Diabetes Education No   He reports he went to Diabetes classes with his mother several years ago.   Disease state  Explored patient's options for treatment of their diabetes    Nutrition management  Role of diet in the treatment of diabetes and the relationship between the three main macronutrients and blood glucose level;Food label reading, portion sizes and measuring food.;Reviewed blood glucose goals for pre and post meals and how to evaluate the patients' food intake on  their blood glucose level.    Physical activity and exercise  Role of exercise on diabetes management, blood pressure control and cardiac health.    Medications Taught/reviewed insulin injection, site rotation, insulin storage and needle disposal.;Reviewed patients medication for diabetes, action, purpose, timing of dose and side effects.    Monitoring Identified appropriate SMBG and/or A1C goals.   Time in Range using FreeStyle Libre   Acute complications Taught treatment of hypoglycemia - the 15 rule.    Chronic complications Relationship between chronic complications and blood glucose control    Psychosocial adjustment Identified and addressed patients feelings and concerns about diabetes      Individualized Goals (developed by patient)   Reducing Risk Other (comment)   improve blood sugars, decrease medications, lose weight     Outcomes   Expected Outcomes Demonstrated interest in learning. Expect positive outcomes    Future DMSE 2 months        Individualized Plan for Diabetes Self-Management Training:   Learning Objective:  Patient will have a greater understanding of diabetes self-management. Patient education plan is to attend individual and/or group sessions per assessed needs and concerns.   Plan:   Patient Instructions  Check blood sugars 4 x day before each meal and before bed every day and as needed with FreeStyle Libre Bring blood sugar records to the next appointment  Exercise:  Begin walking  for    10-15  minutes   3  days a week and gradually increase to 30 minutes 5 x week  Eat 3 meals day,   1-2  snacks a day Space meals 4-6 hours apart Don't skip meals Allow 2-3 hours between meals and snacks Limit fried foods  Carry fast acting glucose and a snack at all times Rotate injection sites Hold insulin pen in place for 5-10 seconds after injection  Return for appointment on:  Wednesday March 21, 2021 at 1:30 pm with Pam (dietitian)   Expected Outcomes:   Demonstrated interest in learning. Expect positive outcomes  Education material provided:  General Meal Planning Guidelines Simple Meal Plan Glucose tablets Symptoms, causes and treatments of Hypoglycemia   If problems or questions, patient to contact team via:   Johny Drilling, RN, [place], [place] [phone]  Future DSME appointment: 2 months The patient didn't want to come to Diabetes Classes. He agreed to attend the 2 Hour Refresher Program. His next appointment is scheduled for March 21, 2021 with the dietitian.

## 2021-02-01 DIAGNOSIS — E1165 Type 2 diabetes mellitus with hyperglycemia: Secondary | ICD-10-CM | POA: Diagnosis not present

## 2021-02-05 ENCOUNTER — Ambulatory Visit (INDEPENDENT_AMBULATORY_CARE_PROVIDER_SITE_OTHER): Payer: Medicare Other | Admitting: Gastroenterology

## 2021-02-05 ENCOUNTER — Encounter: Payer: Self-pay | Admitting: Gastroenterology

## 2021-02-05 VITALS — BP 102/69 | HR 116 | Temp 98.5°F | Ht 64.0 in | Wt 187.6 lb

## 2021-02-05 DIAGNOSIS — K219 Gastro-esophageal reflux disease without esophagitis: Secondary | ICD-10-CM

## 2021-02-05 DIAGNOSIS — K5900 Constipation, unspecified: Secondary | ICD-10-CM | POA: Diagnosis not present

## 2021-02-05 NOTE — Progress Notes (Signed)
Primary Care Physician: Doreen Beam, [name]  Primary Gastroenterologist:  Dr. [name]  Chief Complaint  Patient presents with   Gastroesophageal Reflux     HPI: Benjamin Conley is a 55 y.o. male here with a history of seeing me in the past for an EGD and colonoscopy due to anemia.  The patient's blood count has been normal and the patient had multiple AVMs that were treated previously.  The patient had started Ozempic and reported having worsening reflux and was put on Protonix 20 mg once a day.  His primary care provider also explained to the patient that dyspepsia was a side effect of this medication. He also reports that he has some constipation that he says comes and goes. Right now he is not having it.  The patient also reports that he is having very little acid breakthrough on the pantoprazole 20 mg a day.  The patient was given a 90-day supply and has 2 months left of this.  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 90 tablet 3   atorvastatin (LIPITOR) 40 MG tablet Take 1 tablet (40 mg total) by mouth daily. 90 tablet 3   blood glucose meter kit and supplies Dispense based on patient and insurance preference. Use up to four times daily as directed. (FOR ICD-10 E10.9, E11.9). 1 each 0   clopidogrel (PLAVIX) 75 MG tablet Take 1 tablet (75 mg total) by mouth daily. 90 tablet 3   insulin glargine (LANTUS) 100 UNIT/ML  Solostar Pen Inject 9 Units into the skin at bedtime. 15 mL 3   Insulin Pen Needle 32G X 4 MM MISC Use as directed to administer insulin 50 each 2   nystatin-triamcinolone ointment (MYCOLOG) Apply 1 application topically 2 (two) times daily. 60 g 0   pantoprazole (PROTONIX) 20 MG tablet Take 1 tablet (20 mg total) by mouth daily. One hour before breakfast 90 tablet 0   Semaglutide, 2 MG/DOSE, 8 MG/3ML SOPN Inject 2 mg as directed once a week. 9 mL 1   sildenafil (VIAGRA) 100 MG tablet Take 1 tablet (100 mg total) by mouth daily as needed for erectile dysfunction. 30 tablet 0   tamsulosin (FLOMAX) 0.4 MG CAPS capsule Take 1 capsule (0.4 mg total) by mouth daily. 90 capsule 0   albuterol (VENTOLIN HFA) 108 (90 Base) MCG/ACT inhaler Inhale 2 puffs into the lungs every 6 (six) hours as needed for wheezing or shortness of breath. (Patient not taking: Reported on 01/12/2021) 6.7 g 3   Iron-Vitamin C (VITRON-C) 65-125 MG TABS Take 1 tablet by mouth daily. (Patient not taking: Reported on 01/30/2021) 60 tablet 0   Tiotropium Bromide-Olodaterol (STIOLTO RESPIMAT) 2.5-2.5 MCG/ACT AERS Inhale 2 puffs into the lungs daily. (Patient not taking: Reported on 01/30/2021) 1 each 0   No current facility-administered medications for this visit.    Allergies as of 02/05/2021 - Review Complete 02/05/2021  Allergen  Reaction Noted   Wasp venom Anaphylaxis 11/02/2014   Wasp venom protein Anaphylaxis 11/25/2014   Coffee flavor Nausea And Vomiting 08/01/2019   Onion Nausea And Vomiting 05/25/2018    ROS:  General: Negative for anorexia, weight loss, fever, chills, fatigue, weakness. ENT: Negative for hoarseness, difficulty swallowing , nasal congestion. CV: Negative for chest pain, angina, palpitations, dyspnea on exertion, peripheral edema.  Respiratory: Negative for dyspnea at rest, dyspnea on exertion, cough, sputum, wheezing.  GI: See history of present illness. GU:  Negative for dysuria, hematuria, urinary  incontinence, urinary frequency, nocturnal urination.  Endo: Negative for unusual weight change.    Physical Examination:   BP 102/69 (BP Location: Left Arm, Patient Position: Sitting, Cuff Size: Large)   Pulse (!) 116   Temp 98.5 F (36.9 C) (Temporal)   Ht _0  (1.626 m)   Wt 187 lb 9.6 oz (85.1 kg)   BMI 32.20 kg/m   General: Well-nourished, well-developed in no acute distress.  Eyes: No icterus. Conjunctivae pink. Lungs: Clear to auscultation bilaterally. Non-labored. Heart: Regular rate and rhythm, no murmurs rubs or gallops.  Abdomen: Bowel sounds are normal, nontender, nondistended, no hepatosplenomegaly or masses, no abdominal bruits or hernia , no rebound or guarding.   Extremities: No lower extremity edema. No clubbing or deformities. Neuro: Alert and oriented x 3.  Grossly intact. Skin: Warm and dry, no jaundice.   Psych: Alert and cooperative, normal mood and affect.  Labs:    Imaging Studies: No results found.  Assessment and Plan:   Benjamin Conley is a 55 y.o. y/o male who comes in today with a history of reflux symptoms and dyspepsia that started when he started his Ozempic.  The patient also reports having some constipation that he does not think is related to his medication but only comes and goes intermittently.  He states that at the present time his stools are loosening up.  The patient has acid breakthrough less than once a week and has been told to treat that symptomatically with Tums if he has any continue on the low-dose pantoprazole at 20 mg a day.  When the patient is running out of it he has been told to contact my office and we will send him a new prescription.  As for the constipation the patient has been told to start a fiber supplementation daily to help with his constipation.  The patient has been suggested to take Citrucel.  The patient has been explained the plan agrees with it.     [name], MD. [name]    Note: This dictation was  prepared with Dragon dictation along with smaller phrase technology. Any transcriptional errors that result from this process are unintentional.

## 2021-02-05 NOTE — Patient Instructions (Signed)
CITRUCEL DAILY. YOU CAN PURCHASE THIS AT YOUR PHARMACY.

## 2021-02-16 DIAGNOSIS — R42 Dizziness and giddiness: Secondary | ICD-10-CM | POA: Diagnosis not present

## 2021-02-19 DIAGNOSIS — E119 Type 2 diabetes mellitus without complications: Secondary | ICD-10-CM | POA: Diagnosis not present

## 2021-02-19 LAB — HM DIABETES EYE EXAM

## 2021-02-20 ENCOUNTER — Ambulatory Visit: Payer: Medicare Other | Admitting: Pharmacist

## 2021-02-20 ENCOUNTER — Telehealth: Payer: Self-pay | Admitting: Family

## 2021-02-20 DIAGNOSIS — K219 Gastro-esophageal reflux disease without esophagitis: Secondary | ICD-10-CM

## 2021-02-20 DIAGNOSIS — I951 Orthostatic hypotension: Secondary | ICD-10-CM

## 2021-02-20 DIAGNOSIS — E119 Type 2 diabetes mellitus without complications: Secondary | ICD-10-CM

## 2021-02-20 DIAGNOSIS — D735 Infarction of spleen: Secondary | ICD-10-CM

## 2021-02-20 DIAGNOSIS — E785 Hyperlipidemia, unspecified: Secondary | ICD-10-CM

## 2021-02-20 NOTE — Telephone Encounter (Signed)
Talked with patient for CCM visit. Reviewed as below. He will be looking out for call to schedule carotid US.   He reminded me that he saw cardiology in September, Holter at that time was negative for arrhythmias. Reviewed his elevated heart rates at home. Previously on metoprolol, but this was discontinued due to hypotension.   Patient has follow up with you tomorrow.

## 2021-02-20 NOTE — Patient Instructions (Signed)
Visit Information  Following are the goals we discussed today:  Patient Goals/Self-Care Activities Over the next 90 days, patient will:  - take medications as prescribed check blood glucose daily, document, and provide at future appointments check blood pressure daily, document, and provide at future appointments        Plan: Telephone follow up appointment with care management team member scheduled for:  8 weeks   Catie Darnelle Maffucci, PharmD, [place], CPP Clinical Pharmacist [facility] at [facility] [phone]   Please call the care guide team at [phone] if you need to cancel or reschedule your appointment.   Patient verbalizes understanding of instructions provided today and agrees to view in [place].

## 2021-02-20 NOTE — Telephone Encounter (Signed)
Call patient I reviewed consult note with ear nose and throat, Dr. Richardson Landry   In regards to ongoing dizziness,  Dr. Richardson Landry felt less likely vertigo and more likely related to presyncopal episodes.  He advised ultrasound of his carotids.  I have ordered this. Let us know if you dont hear back within a week in regards to an appointment being scheduled.   He also advised follow-up and consideration for Holter monitor with cardiology.  I do not see he has a follow-up with Dr. Charlestine Night cardiology.    Please call and schedule this for patient for ongoing evaluation after ENT consult

## 2021-02-20 NOTE — Chronic Care Management (AMB) (Signed)
Chronic Care Management CCM Pharmacy Note  02/20/2021 Name:  Benjamin Conley MRN:  [id number] DOB:  [birth year]  Summary: - Post prandial glycemic elevations.  - Continued presyncopal symptoms   Recommendations/Changes made from today's visit: - Follow up with Arnett tomorrow as scheduled.   Subjective: Benjamin Conley is an 55 y.o. year old male who is a primary patient of Flinchum, Kelby Aline, [name].  The CCM team was consulted for assistance with disease management and care coordination needs.    Engaged with patient by telephone for follow up visit for pharmacy case management and/or care coordination services.   Objective:  Medications Reviewed Today     Reviewed by [name], RPH-CPP (Pharmacist) on 02/20/21 at [phone]  Med List Status: <None>   Medication Order Taking? Sig Documenting Provider Last Dose Status Informant  albuterol (VENTOLIN HFA) 108 (90 Base) MCG/ACT inhaler [id number] No Inhale 2 puffs into the lungs every 6 (six) hours as needed for wheezing or shortness of breath.  Patient not taking: Reported on 01/12/2021   Crecencio Mc, MD Not Taking Active   aspirin 81 MG EC tablet [id number] Yes Take 1 tablet (81 mg total) by mouth daily. Crecencio Mc, MD Taking Active   atorvastatin (LIPITOR) 40 MG tablet [id number] Yes Take 1 tablet (40 mg total) by mouth daily. Crecencio Mc, MD Taking Active   blood glucose meter kit and supplies [id number] Yes Dispense based on patient and insurance preference. Use up to four times daily as directed. (FOR ICD-10 E10.9, E11.9). [name], NP Taking Active   clopidogrel (PLAVIX) 75 MG tablet [id number] Yes Take 1 tablet (75 mg total) by mouth daily. Crecencio Mc, MD Taking Active   insulin glargine (LANTUS) 100 UNIT/ML Solostar Pen [id number] Yes Inject 9 Units into the skin at bedtime. Burnard Hawthorne, [name] Taking Active            Med Note [name]   Tue Feb 20, 2021  9:45 AM) Using 12 units   Insulin Pen Needle 32G X 4 MM MISC [id number] Yes Use as directed to administer insulin Flinchum, Kelby Aline, [name] Taking Active   nystatin-triamcinolone ointment [facility]) [id number] Yes Apply 1 application topically 2 (two) times daily. Flinchum, Kelby Aline, [name] Taking Active   pantoprazole (PROTONIX) 20 MG tablet [id number] Yes Take 1 tablet (20 mg total) by mouth daily. One hour before breakfast Burnard Hawthorne, [name] Taking Active   Semaglutide, 2 MG/DOSE, 8 MG/3ML SOPN [id number] Yes Inject 2 mg as directed once a week. Burnard Hawthorne, [name] Taking Active   sildenafil (VIAGRA) 100 MG tablet [id number] Yes Take 1 tablet (100 mg total) by mouth daily as needed for erectile dysfunction. [name], PA-C Taking Active   tamsulosin (FLOMAX) 0.4 MG CAPS capsule [id number] Yes Take 1 capsule (0.4 mg total) by mouth daily. Doreen Beam, [name] Taking Active             Pertinent Labs:   Lab Results  Component Value Date   HGBA1C 7.8 (A) 11/21/2020   Lab Results  Component Value Date   CHOL 94 01/21/2020   HDL 36 (L) 01/21/2020   LDLCALC 40 01/21/2020   TRIG 98 01/21/2020   CHOLHDL 2.6 01/21/2020   Lab Results  Component Value Date   CREATININE 0.67 11/03/2020   BUN 11 11/03/2020   NA 137 11/03/2020   K 4.0 11/03/2020   CL 102 11/03/2020   CO2 27 11/03/2020  SDOH:  (Social Determinants of Health) assessments and interventions performed:  SDOH Interventions    Flowsheet Row Most Recent Value  SDOH Interventions   Financial Strain Interventions Intervention Not Indicated       CCM Care Plan  Review of patient past medical history, allergies, medications, health status, including review of consultants reports, laboratory and other test data, was performed as part of comprehensive evaluation and provision of chronic care management services.   Care Plan : Medication Management  Updates made by [name], RPH-CPP since 02/20/2021 12:00 AM      Problem: Diabetes, Tobacco Abuse, Splenic Infarct, Tachycardia      Long-Range Goal: Disease Progression Prevention   Recent Progress: On track  Priority: High  Note:   Current Barriers:  Unable to independently monitor therapeutic efficacy Unable to achieve control of diabetes  Suboptimal cardiovascular risk reduction  Pharmacist Clinical Goal(s):  Over the next 90 days, patient will achieve adherence to monitoring guidelines and medication adherence to achieve therapeutic efficacy. Over the next 90 days, patient will achieve control of diabetes as evidenced by improvement in A1c through collaboration with PharmD and provider.   Interventions: 1:1 collaboration with covering provider, Deborra Medina, MD regarding development and update of comprehensive plan of care as evidenced by provider attestation and co-signature Inter-disciplinary care team collaboration (see longitudinal plan of care) Comprehensive medication review performed; medication list updated in electronic medical record  Health Maintenance   Yearly diabetic eye exam: up to date Yearly diabetic foot exam: up to date Urine microalbumin: up to date Yearly influenza vaccination: due - recommended to pursue Td/Tdap vaccination: up to date Pneumonia vaccination: up to date COVID vaccinations: due - patient previously declined Shingrix vaccinations: due - patient previously declined Colonoscopy: up to date  Acute Needs: Red spot on foot. Raised. Hurts like "nerve pain" when touched. Will discuss with Arnett tomorrow.   Diabetes: Uncontrolled but improving; current treatment: Ozempic 2 mg weekly, Lantus 12 units daily- self increased as he felt 9 units was not controlling blood sugars  Reports acid reflux has been a little worse lately, but he has been eating more chocolates. Discussed that he has been trying to follow the dietician recommendations, but he is finding it difficult to adjust his typical dietary patterns  from 2 bigger meals to 3 smaller meals. Reviewed that these recommendations, in addition to being beneficial for glycemic control, would likely be beneficial in reducing acid reflux symptoms.  Current meal patterns: breakfast: sometimes skips, sometimes couple of eggs, sausage; bowl of oatmeal, cream of wheat, sometimes waffle or pancakes; Lunch: sometimes sandwich; supper: chicken, baked pork loin, burritos; drinks: water,reports a lot of chocolate lately;  Hx metformin - could not tolerate d/t diarrhea, even XR formulation. Current glucose readings: using Libre 2  Date of Download: last 14 days Average Glucose: 177 mg/dL - midnight - 6 am: 147 - 6 am - noon: 154 - noon - 6 pm: 202 - 6 pm- midnight: 218 Time in Goal:  - Time in range 70-180: 56% - Time above range: 42% - Time below range: 2% Encouraged to bring meter to follow up with Arnett tomorrow. Appears highest elevations are post prandial. Encouraged to use post prandial Libre data to evaluate his meals, and adjust carbohydrate content if post prandial readings are above goal. Would avoid SGLT2 (to target post prandial) given presyncopal episodes and risk of exacerbation due to diuretic effect. Could consider addition of prandial insulin if patient is unable to improve post  prandial readings with dietary modifications.    Hypertension: Controlled per home readings; current regimen: none  Hx metoprolol - d/c d/t hypotension Does report lightheadedness/dizziness, 0-3 times a day. Happens when bending over, going to stand up from sitting, tilting head to drink, tilting to rinse shampoo from hair. Denies any falls, but reports near-falls. Evaluated by cardiology, ENT. Follow up with PCP tomorrow. Carotid US scheduled.  Previously recommended to continue current regimen at this time. Continue presyncopal episode work up.   Hyperlipidemia, hx splenic infarct Controlled per last lipid panel; current treatment: atorvastatin 40 mg  daily Antiplatelet regimen: aspirin 81 mg daily; clopidogrel 75 mg daily  Recommended to continue current regimen at this time. Reviewed importance of continuing cholesterol and antiplatelet therapy for secondary prevention. Follow up with Vascular as scheduled.   Chronic Obstructive Pulmonary Disease, hx Tobacco Abuse Confirms avoidance of tobacco products since 02/12/20. Denies use of either Stiolto 2.5/2.5 mcg 2 puffs daily or albuterol HFA rescue inhaler recently.  Previously recommended to continue current regimen. Praised for continued abstinence from tobacco products.   Iron Deficiency Anemia: Managed by hematology; recent Venofer infusion; reports he has not been taking the iron + Vitamin C supplement at home.  Previously recommended to continue current regimen. Discuss need for iron supplement w/ hematology moving forward. Follow up scheduled in the Spring.   BPH: Controlled per patient report; current regimen: tamsulosin 0.4 mg daily; follows w/ urology [place] Previously recommended to continue current regimen at this time  GERD: Uncontrolled, but likely related to dietary changes; current regimen: pantoprazole 20 mg daily Reports more chocolate lately, discussed this impact on acid reflux.  Continue current regimen at this time. Follow up with GI as recommended    Patient Goals/Self-Care Activities Over the next 90 days, patient will:  - take medications as prescribed check blood glucose daily, document, and provide at future appointments check blood pressure daily, document, and provide at future appointments      Plan: Telephone follow up appointment with care management team member scheduled for:  8 weeks  Catie Darnelle Maffucci, PharmD, [place], [place] Pharmacist [employer] at [employer] [phone]

## 2021-02-21 ENCOUNTER — Encounter: Payer: Self-pay | Admitting: Family

## 2021-02-21 ENCOUNTER — Other Ambulatory Visit: Payer: Medicare Other

## 2021-02-21 ENCOUNTER — Ambulatory Visit: Payer: Medicare Other | Admitting: Internal Medicine

## 2021-02-21 ENCOUNTER — Ambulatory Visit (INDEPENDENT_AMBULATORY_CARE_PROVIDER_SITE_OTHER): Payer: Medicare Other | Admitting: Family

## 2021-02-21 ENCOUNTER — Other Ambulatory Visit: Payer: Self-pay

## 2021-02-21 VITALS — BP 128/78 | HR 110 | Temp 97.2°F | Ht 64.0 in | Wt 188.0 lb

## 2021-02-21 DIAGNOSIS — E119 Type 2 diabetes mellitus without complications: Secondary | ICD-10-CM

## 2021-02-21 DIAGNOSIS — Z122 Encounter for screening for malignant neoplasm of respiratory organs: Secondary | ICD-10-CM | POA: Diagnosis not present

## 2021-02-21 DIAGNOSIS — Z125 Encounter for screening for malignant neoplasm of prostate: Secondary | ICD-10-CM | POA: Diagnosis not present

## 2021-02-21 DIAGNOSIS — R000 Tachycardia, unspecified: Secondary | ICD-10-CM | POA: Diagnosis not present

## 2021-02-21 DIAGNOSIS — I951 Orthostatic hypotension: Secondary | ICD-10-CM

## 2021-02-21 MED ORDER — METOPROLOL SUCCINATE ER 25 MG PO TB24: 12.5000 mg | ORAL_TABLET | Freq: Every day | ORAL | 1 refills | Status: DC

## 2021-02-21 NOTE — Assessment & Plan Note (Addendum)
Uncontrolled. One day early for a1c. Will obtain this week. Continue metformin 1000mg  bid. Concern with discrepancy with CGM and POC glucometer; advised to continue to also use glucometer to ensure accurate results. Would like to start tresiba if covered by insurance. Will await a1c.

## 2021-02-21 NOTE — Telephone Encounter (Signed)
noted 

## 2021-02-21 NOTE — Progress Notes (Signed)
Subjective:    Patient ID: Benjamin Conley, male    DOB: Jul 17, 1965, 55 y.o.   MRN: [id number]  CC: Benjamin Conley is a 55 y.o. male who presents today for follow up.   HPI: Follow-up   He continues to have dizziness, which comes and goes. It occurs 2-3 times per day exacerbated by looking up and down or when he closes his eyes. He can also feel dizzy when going from sitting to standing.   No recurrent syncopal episodes. No tremor, vision changes, CP, sob.  He denies palpitations.  No Sudafed, drug use.  No longer smokes cigarettes  No caffeine use.   Consult with ENT, Dr Richardson Landry who did not feel symptom was vertigo in nature. Advised US carotid ( done 10/25/20) , holter monitor ( done 10/2020).     He is no longer on metoprolol due to dizziness . Evaluated by Dr Melrose Nakayama 09/2019 and US carotid ordered appears done 10/26/2019  DM-wearing libre with average glucose 177.  Compliant with Ozempic 2 mg weekly, Lantus 12 units (self increased from 9 units) . He is no longer drinking lemonade. FBG 75, 155, 174. He feels that CGM may not be accurate. He forgot to bring his written sheets. He denies hypoglycemic episodes as when checked on poc he did have hypoglycemia to  match with CGM.    Holter monitor performed 10/30/2020 with Dr. Charlestine Night showed occasional paroxysmal SVTs.  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   Tobacco abuse    Upper GI bleed 08/21/2019   Past Surgical History:  Procedure Laterality Date   BACK SURGERY     lumbar   COLONOSCOPY  WITH PROPOFOL N/A 08/24/2019   Procedure: COLONOSCOPY WITH PROPOFOL;  Surgeon: [name], MD;  Location: [facility] ENDOSCOPY;  Service: Endoscopy;  Laterality: N/A;   ESOPHAGOGASTRODUODENOSCOPY (EGD) WITH PROPOFOL N/A 08/24/2019   Procedure: ESOPHAGOGASTRODUODENOSCOPY (EGD) WITH PROPOFOL;  Surgeon: [name], MD;  Location: [facility] ENDOSCOPY;  Service: Endoscopy;  Laterality: N/A;   STENT PLACEMENT VASCULAR ([place] [facility])  07/2019   stenosis of distal splenic artery and stent placed   VISCERAL ANGIOGRAPHY N/A 08/06/2019   Procedure: VISCERAL ANGIOGRAPHY;  Surgeon: [name], MD;  Location: [place] [facility];  Service: Cardiovascular;  Laterality: N/A;   Family History  Problem Relation Age of Onset   Diabetes Mother    Diabetes Mellitus II Mother    Tuberculosis Father    Heart disease Father    Diabetes Sister    Diabetes Sister    Diabetes Sister    Diabetes Sister    Diabetes Brother    Diabetes Mellitus II Brother    Hyperlipidemia Brother    Cancer Maternal Aunt    Colon cancer Maternal Uncle    Prostate cancer Neg Hx     Allergies: Wasp venom, Wasp venom protein, Coffee flavor, and Onion Current Outpatient Medications on File Prior to Visit  Medication Sig Dispense Refill   albuterol (VENTOLIN HFA) 108 (90 Base) MCG/ACT inhaler Inhale 2 puffs into the lungs every 6 (six) hours as needed for  wheezing or shortness of breath. 6.7 g 3   aspirin 81 MG EC tablet Take 1 tablet (81 mg total) by mouth daily. 90 tablet 3   atorvastatin (LIPITOR) 40 MG tablet Take 1 tablet (40 mg total) by mouth daily. 90 tablet 3   blood glucose meter kit and supplies Dispense based on patient and insurance preference. Use up to four times daily as directed. (FOR ICD-10 E10.9, E11.9). 1 each 0   clopidogrel (PLAVIX) 75 MG tablet Take 1 tablet (75 mg total) by mouth daily. 90 tablet 3   insulin glargine (LANTUS) 100 UNIT/ML Solostar Pen Inject 9 Units into the skin at bedtime. 15 mL 3   Insulin Pen  Needle 32G X 4 MM MISC Use as directed to administer insulin 50 each 2   nystatin-triamcinolone ointment (MYCOLOG) Apply 1 application topically 2 (two) times daily. 60 g 0   pantoprazole (PROTONIX) 20 MG tablet Take 1 tablet (20 mg total) by mouth daily. One hour before breakfast 90 tablet 0   Semaglutide, 2 MG/DOSE, 8 MG/3ML SOPN Inject 2 mg as directed once a week. 9 mL 1   sildenafil (VIAGRA) 100 MG tablet Take 1 tablet (100 mg total) by mouth daily as needed for erectile dysfunction. 30 tablet 0   tamsulosin (FLOMAX) 0.4 MG CAPS capsule Take 1 capsule (0.4 mg total) by mouth daily. 90 capsule 0   No current facility-administered medications on file prior to visit.    Social History   Tobacco Use   Smoking status: Former    Packs/day: 1.00    Years: 48.00    Pack years: 48.00    Types: Cigarettes    Quit date: 02/12/2020    Years since quitting: 1.0   Smokeless tobacco: Never  Vaping Use   Vaping Use: Never used  Substance Use Topics   Alcohol use: No    Comment: Never been a problem   Drug use: No    Review of Systems  Constitutional:  Negative for chills and fever.  Eyes:  Negative for visual disturbance.  Respiratory:  Negative for cough and shortness of breath.   Cardiovascular:  Negative for chest pain, palpitations and leg swelling.  Gastrointestinal:  Negative for nausea and vomiting.  Neurological:  Positive for dizziness. Negative for syncope (no recurrence).     Objective:    BP 128/78    Pulse (!) 110    Temp (!) 97.2 F (36.2 C) (Skin)    Ht _0  (1.626 m)    Wt 188 lb (85.3 kg)    SpO2 97%    BMI 32.27 kg/m  BP Readings from Last 3 Encounters:  02/21/21 128/78  02/05/21 102/69  01/30/21 118/70   Wt Readings from Last 3 Encounters:  02/21/21 188 lb (85.3 kg)  02/05/21 187 lb 9.6 oz (85.1 kg)  01/30/21 191 lb 3.2 oz (86.7 kg)    Physical Exam Vitals reviewed.  Constitutional:      Appearance: He is well-developed.  Cardiovascular:     Rate and  Rhythm: Regular rhythm. Tachycardia present.     Heart sounds: Normal heart sounds.     Comments: No carotid bruit Pulmonary:     Effort: Pulmonary effort is normal. No respiratory distress.     Breath sounds: Normal breath sounds. No wheezing, rhonchi or rales.  Skin:    General: Skin is warm and dry.  Neurological:     Mental Status: He is alert.  Psychiatric:  Speech: Speech normal.        Behavior: Behavior normal.       Assessment & Plan:   Problem List Items Addressed This Visit       Cardiovascular and Mediastinum   Orthostatic hypotension    Unchanged. Discussed the importance of hydration, careful with position changes. I have reached out to urology, McGowan, regarding consideration of alternative of flomax. Advised cardiology follow up. Follow up in one month      Relevant Medications   metoprolol succinate (TOPROL-XL) 25 MG 24 hr tablet     Endocrine   Diabetes mellitus without complication ([place]) - Primary    Uncontrolled. One day early for a1c. Will obtain this week. Continue metformin 1025m bid. Concern with discrepancy with CGM and POC glucometer; advised to continue to also use glucometer to ensure accurate results. Would like to start tresiba if covered by insurance. Will await a1c.       Relevant Orders   Hemoglobin A1c   Comprehensive metabolic panel   Lipid panel   VITAMIN D 25 Hydroxy (Vit-D Deficiency, Fractures)   TSH     Other   Tachycardia    New today. He doesn't feel palpitations. Denies CP. EKG shows sinus tachycardia, no acute ischemia or significant changes from prior 05/22/20. Start toprol 12.539mXL with close monitoring for worsening of orthostasis. Close follow up.  Case reviewed with supervising, Dr TeDeborra Medinaand she and I jointly agreed on management plan.        Relevant Medications   metoprolol succinate (TOPROL-XL) 25 MG 24 hr tablet   Other Relevant Orders   EKG 12-Lead (Completed)   Magnesium   Other Visit  Diagnoses     Prostate cancer screening       Relevant Orders   PSA   Screening for lung cancer       Relevant Orders   Ambulatory Referral Lung Cancer Screening [facility]        I am having Benjamin Conley start on metoprolol succinate. I am also having him maintain his sildenafil, blood glucose meter kit and supplies, tamsulosin, nystatin-triamcinolone ointment, Insulin Pen Needle, atorvastatin, clopidogrel, aspirin, albuterol, Semaglutide (2 MG/DOSE), pantoprazole, and insulin glargine.   Meds ordered this encounter  Medications   metoprolol succinate (TOPROL-XL) 25 MG 24 hr tablet    Sig: Take 0.5 tablets (12.5 mg total) by mouth daily.    Dispense:  90 tablet    Refill:  1    Order Specific Question:   Supervising Provider    Answer:   TUCrecencio [id number]]     Return precautions given.   Risks, benefits, and alternatives of the medications and treatment plan prescribed today were discussed, and patient expressed understanding.   Education regarding symptom management and diagnosis given to patient on AVS.  Continue to follow with [place], MiKelby AlineFNP for routine health maintenance.   Benjamin RaRennie Natternd I agreed with plan.   MaMable ParisFNP

## 2021-02-21 NOTE — Patient Instructions (Addendum)
Referral to CT lung cancer screening  Let us know if you dont hear back within a week in regards to an appointment being scheduled.   Start toprol 12.5mg  tablet for elevated heart rate  Please call to ensure follow up with Dr Charlestine Night is scheduled as well. We will work on retrieving the ultrasound of your carotids from [place] last year.   Labs fasting tomorrow or this week

## 2021-02-21 NOTE — Assessment & Plan Note (Signed)
Unchanged. Discussed the importance of hydration, careful with position changes. I have reached out to urology, McGowan, regarding consideration of alternative of flomax. Advised cardiology follow up. Follow up in one month

## 2021-02-21 NOTE — Assessment & Plan Note (Signed)
New today. He doesn't feel palpitations. Denies CP. EKG shows sinus tachycardia, no acute ischemia or significant changes from prior 05/22/20. Start toprol 12.5mg  XL with close monitoring for worsening of orthostasis. Close follow up.  Case reviewed with supervising, Dr Deborra Medina, and she and I jointly agreed on management plan.

## 2021-02-23 ENCOUNTER — Other Ambulatory Visit: Payer: Self-pay

## 2021-02-23 ENCOUNTER — Other Ambulatory Visit (INDEPENDENT_AMBULATORY_CARE_PROVIDER_SITE_OTHER): Payer: Medicare Other

## 2021-02-23 DIAGNOSIS — E119 Type 2 diabetes mellitus without complications: Secondary | ICD-10-CM

## 2021-02-23 DIAGNOSIS — R000 Tachycardia, unspecified: Secondary | ICD-10-CM | POA: Diagnosis not present

## 2021-02-23 DIAGNOSIS — Z125 Encounter for screening for malignant neoplasm of prostate: Secondary | ICD-10-CM

## 2021-02-23 NOTE — Addendum Note (Signed)
Addended by: Leeanne Rio on: 02/23/2021 02:30 PM   Modules accepted: Orders

## 2021-02-24 LAB — COMPREHENSIVE METABOLIC PANEL
ALT: 25 IU/L (ref 0–44)
AST: 17 IU/L (ref 0–40)
Albumin/Globulin Ratio: 1.8 (ref 1.2–2.2)
Albumin: 4.5 g/dL (ref 3.8–4.9)
Alkaline Phosphatase: 76 IU/L (ref 44–121)
BUN/Creatinine Ratio: 16 (ref 9–20)
BUN: 13 mg/dL (ref 6–24)
Bilirubin Total: 0.4 mg/dL (ref 0.0–1.2)
CO2: 25 mmol/L (ref 20–29)
Calcium: 9.7 mg/dL (ref 8.7–10.2)
Chloride: 100 mmol/L (ref 96–106)
Creatinine, Ser: 0.79 mg/dL (ref 0.76–1.27)
Globulin, Total: 2.5 g/dL (ref 1.5–4.5)
Glucose: 126 mg/dL — ABNORMAL HIGH (ref 70–99)
Potassium: 4.4 mmol/L (ref 3.5–5.2)
Sodium: 137 mmol/L (ref 134–144)
Total Protein: 7 g/dL (ref 6.0–8.5)
eGFR: 105 mL/min/{1.73_m2} (ref >59)

## 2021-02-24 LAB — HEMOGLOBIN A1C
Est. average glucose Bld gHb Est-mCnc: 180 mg/dL
Hgb A1c MFr Bld: 7.9 % — ABNORMAL HIGH (ref 4.8–5.6)

## 2021-02-24 LAB — LIPID PANEL
Chol/HDL Ratio: 3.4 ratio (ref 0.0–5.0)
Cholesterol, Total: 138 mg/dL (ref 100–199)
HDL: 41 mg/dL (ref >39)
LDL Chol Calc (NIH): 74 mg/dL (ref 0–99)
Triglycerides: 126 mg/dL (ref 0–149)
VLDL Cholesterol Cal: 23 mg/dL (ref 5–40)

## 2021-02-24 LAB — VITAMIN D 25 HYDROXY (VIT D DEFICIENCY, FRACTURES): Vit D, 25-Hydroxy: 21.3 ng/mL — ABNORMAL LOW (ref 30.0–100.0)

## 2021-02-24 LAB — PSA: Prostate Specific Ag, Serum: 4.4 ng/mL — ABNORMAL HIGH (ref 0.0–4.0)

## 2021-02-24 LAB — TSH: TSH: 1.37 u[IU]/mL (ref 0.450–4.500)

## 2021-02-24 LAB — MAGNESIUM: Magnesium: 2.1 mg/dL (ref 1.6–2.3)

## 2021-02-27 ENCOUNTER — Other Ambulatory Visit: Payer: Self-pay | Admitting: Family

## 2021-02-27 ENCOUNTER — Encounter: Payer: Self-pay | Admitting: Family

## 2021-02-27 DIAGNOSIS — E119 Type 2 diabetes mellitus without complications: Secondary | ICD-10-CM

## 2021-02-27 MED ORDER — INSULIN GLARGINE 100 UNIT/ML SOLOSTAR PEN: 12.0000 [IU] | PEN_INJECTOR | Freq: Every day | SUBCUTANEOUS | 3 refills | Status: DC

## 2021-02-27 MED ORDER — INSULIN LISPRO (1 UNIT DIAL) 100 UNIT/ML (KWIKPEN): 4.0000 [IU] | PEN_INJECTOR | Freq: Two times a day (BID) | SUBCUTANEOUS | 11 refills | Status: DC

## 2021-02-28 ENCOUNTER — Other Ambulatory Visit: Payer: Self-pay

## 2021-02-28 MED ORDER — INSULIN PEN NEEDLE 32G X 4 MM MISC: 2 refills | Status: DC

## 2021-02-28 NOTE — Progress Notes (Signed)
Patient scheduled with Christell Faith, PA 04/04/21.

## 2021-02-28 NOTE — Progress Notes (Signed)
Request faxed to Dr. Melrose Nakayama at [facility].

## 2021-03-01 ENCOUNTER — Encounter: Payer: Self-pay | Admitting: Oncology

## 2021-03-01 ENCOUNTER — Telehealth: Payer: Self-pay | Admitting: Pharmacist

## 2021-03-01 NOTE — Telephone Encounter (Signed)
PA for Ozempic 2 mg ([id number]) submitted today via Cover My Meds. Prior PA for Ozempic was approved through 03/10/21, so updated PA needed for 2023.

## 2021-03-02 ENCOUNTER — Encounter: Payer: Self-pay | Admitting: Adult Health

## 2021-03-04 DIAGNOSIS — E1165 Type 2 diabetes mellitus with hyperglycemia: Secondary | ICD-10-CM | POA: Diagnosis not present

## 2021-03-06 NOTE — Telephone Encounter (Signed)
Per Cover My Meds: This medication or product was previously approved on [id number] from 12/05/2020 to 03-10-2021 and [id number] from 03-11-2021 to 03-10-2022

## 2021-03-08 ENCOUNTER — Other Ambulatory Visit: Payer: Self-pay

## 2021-03-08 ENCOUNTER — Ambulatory Visit (INDEPENDENT_AMBULATORY_CARE_PROVIDER_SITE_OTHER): Payer: Medicare Other | Admitting: Adult Health

## 2021-03-08 ENCOUNTER — Encounter: Payer: Self-pay | Admitting: Adult Health

## 2021-03-08 ENCOUNTER — Telehealth: Payer: Self-pay

## 2021-03-08 VITALS — BP 140/80 | HR 112 | Ht 64.02 in | Wt 191.4 lb

## 2021-03-08 DIAGNOSIS — B372 Candidiasis of skin and nail: Secondary | ICD-10-CM | POA: Diagnosis not present

## 2021-03-08 DIAGNOSIS — L80 Vitiligo: Secondary | ICD-10-CM

## 2021-03-08 DIAGNOSIS — E119 Type 2 diabetes mellitus without complications: Secondary | ICD-10-CM | POA: Diagnosis not present

## 2021-03-08 DIAGNOSIS — Z87898 Personal history of other specified conditions: Secondary | ICD-10-CM | POA: Diagnosis not present

## 2021-03-08 DIAGNOSIS — L299 Pruritus, unspecified: Secondary | ICD-10-CM

## 2021-03-08 LAB — COMPREHENSIVE METABOLIC PANEL
ALT: 18 U/L (ref 0–53)
AST: 13 U/L (ref 0–37)
Albumin: 3.9 g/dL (ref 3.5–5.2)
Alkaline Phosphatase: 78 U/L (ref 39–117)
BUN: 14 mg/dL (ref 6–23)
CO2: 28 mEq/L (ref 19–32)
Calcium: 8.8 mg/dL (ref 8.4–10.5)
Chloride: 105 mEq/L (ref 96–112)
Creatinine, Ser: 0.73 mg/dL (ref 0.40–1.50)
GFR: 102.52 mL/min (ref >60.00)
Glucose, Bld: 193 mg/dL — ABNORMAL HIGH (ref 70–99)
Potassium: 3.9 mEq/L (ref 3.5–5.1)
Sodium: 140 mEq/L (ref 135–145)
Total Bilirubin: 0.3 mg/dL (ref 0.2–1.2)
Total Protein: 6.4 g/dL (ref 6.0–8.3)

## 2021-03-08 LAB — CBC WITH DIFFERENTIAL/PLATELET
Basophils Absolute: 0.1 10*3/uL (ref 0.0–0.1)
Basophils Relative: 1.2 % (ref 0.0–3.0)
Eosinophils Absolute: 0.1 10*3/uL (ref 0.0–0.7)
Eosinophils Relative: 1.7 % (ref 0.0–5.0)
HCT: 33.9 % — ABNORMAL LOW (ref 39.0–52.0)
Hemoglobin: 11.3 g/dL — ABNORMAL LOW (ref 13.0–17.0)
Lymphocytes Relative: 44.7 % (ref 12.0–46.0)
Lymphs Abs: 3.2 10*3/uL (ref 0.7–4.0)
MCHC: 33.2 g/dL (ref 30.0–36.0)
MCV: 91.1 fl (ref 78.0–100.0)
Monocytes Absolute: 0.5 10*3/uL (ref 0.1–1.0)
Monocytes Relative: 6.8 % (ref 3.0–12.0)
Neutro Abs: 3.2 10*3/uL (ref 1.4–7.7)
Neutrophils Relative %: 45.6 % (ref 43.0–77.0)
Platelets: 431 10*3/uL — ABNORMAL HIGH (ref 150.0–400.0)
RBC: 3.73 Mil/uL — ABNORMAL LOW (ref 4.22–5.81)
RDW: 14.1 % (ref 11.5–15.5)
WBC: 7.1 10*3/uL (ref 4.0–10.5)

## 2021-03-08 LAB — BRAIN NATRIURETIC PEPTIDE: Pro B Natriuretic peptide (BNP): 34 pg/mL (ref 0.0–100.0)

## 2021-03-08 MED ORDER — FLUCONAZOLE 150 MG PO TABS: 150.0000 mg | ORAL_TABLET | ORAL | 0 refills | Status: DC

## 2021-03-08 MED ORDER — INSULIN GLARGINE 100 UNIT/ML SOLOSTAR PEN: 9.0000 [IU] | PEN_INJECTOR | Freq: Every day | SUBCUTANEOUS | 0 refills | Status: DC

## 2021-03-08 MED ORDER — TRIAMCINOLONE ACETONIDE 0.1 % EX CREA: 1.0000 "application " | TOPICAL_CREAM | Freq: Two times a day (BID) | CUTANEOUS | 0 refills | Status: AC

## 2021-03-08 NOTE — Telephone Encounter (Signed)
We received fax from [facility] that Ozzempic 8mg /61ml was approved 03/11/21-03/10/22.

## 2021-03-08 NOTE — Patient Instructions (Signed)
Rash, Adult °A rash is a change in the color of your skin. A rash can also change the way your skin feels. There are many different conditions and factors that can cause a rash. Some rashes may disappear after a few days, but some may last for a few weeks. Common causes of rashes include: °Viral infections, such as: °Colds. °Measles. °Hand, foot, and mouth disease. °Bacterial infections, such as: °Scarlet fever. °Impetigo. °Fungal infections, such as Candida. °Allergic reactions to food, medicines, or skin care products. °Follow these instructions at home: °The goal of treatment is to stop the itching and keep the rash from spreading. Pay attention to any changes in your symptoms. Follow these instructions to help with your condition: °Medicine °Take or apply over-the-counter and prescription medicines only as told by your health care provider. These may include: °Corticosteroid creams to treat red or swollen skin. °Anti-itch lotions. °Oral allergy medicines (antihistamines). °Oral corticosteroids for severe symptoms. ° °Skin care °Apply cool compresses to the affected areas. °Do not scratch or rub your skin. °Avoid covering the rash. Make sure the rash is exposed to air as much as possible. °Managing itching and discomfort °Avoid hot showers or baths, which can make itching worse. A cold shower may help. °Try taking a bath with: °Epsom salts. Follow manufacturer instructions on the packaging. You can get these at your local pharmacy or grocery store. °Baking soda. Pour a small amount into the bath as told by your health care provider. °Colloidal oatmeal. Follow manufacturer instructions on the packaging. You can get this at your local pharmacy or grocery store. °Try applying baking soda paste to your skin. Stir water into baking soda until it reaches a paste-like consistency. °Try applying calamine lotion. This is an over-the-counter lotion that helps to relieve itchiness. °Keep cool and out of the sun. Sweating  and being hot can make itching worse. °General instructions ° °Rest as needed. °Drink enough fluid to keep your urine pale yellow. °Wear loose-fitting clothing. °Avoid scented soaps, detergents, and perfumes. Use gentle soaps, detergents, perfumes, and other cosmetic products. °Avoid any substance that causes your rash. Keep a journal to help track what causes your rash. Write down: °What you eat. °What cosmetic products you use. °What you drink. °What you wear. This includes jewelry. °Keep all follow-up visits as told by your health care provider. This is important. °Contact a health care provider if: °You sweat at night. °You lose weight. °You urinate more than normal. °You urinate less than normal, or you notice that your urine is a darker color than usual. °You feel weak. °You vomit. °Your skin or the whites of your eyes look yellow (jaundice). °Your skin: °Tingles. °Is numb. °Your rash: °Does not go away after several days. °Gets worse. °You are: °Unusually thirsty. °More tired than normal. °You have: °New symptoms. °Pain in your abdomen. °A fever. °Diarrhea. °Get help right away if you: °Have a fever and your symptoms suddenly get worse. °Develop confusion. °Have a severe headache or a stiff neck. °Have severe joint pains or stiffness. °Have a seizure. °Develop a rash that covers all or most of your body. The rash may or may not be painful. °Develop blisters that: °Are on top of the rash. °Grow larger or grow together. °Are painful. °Are inside your nose or mouth. °Develop a rash that: °Looks like purple pinprick-sized spots all over your body. °Has a "bull's eye" or looks like a target. °Is not related to sun exposure, is red and painful, and causes   your skin to peel. Summary A rash is a change in the color of your skin. Some rashes disappear after a few days, but some may last for a few weeks. The goal of treatment is to stop the itching and keep the rash from spreading. Take or apply over-the-counter  and prescription medicines only as told by your health care provider. Contact a health care provider if you have new or worsening symptoms. Keep all follow-up visits as told by your health care provider. This is important. This information is not intended to replace advice given to you by your health care provider. Make sure you discuss any questions you have with your health care provider. Document Revised: 06/19/2018 Document Reviewed: 09/29/2017 Elsevier Patient Education  [place]. Diabetes Mellitus and Exercise Exercising regularly is important for overall health, especially for people who have diabetes mellitus. Exercising is not only about losing weight. It has many other health benefits, such as increasing muscle strength and bone density and reducing body fat and stress. This leads to improved fitness, flexibility, and endurance, all of which result in better overall health. What are the benefits of exercise if I have diabetes? Exercise has many benefits for people with diabetes. They include: Helping to lower and control blood sugar (glucose). Helping the body to respond better to the hormone insulin by improving insulin sensitivity. Reducing how much insulin the body needs. Lowering the risk for heart disease by: Lowering "bad" cholesterol and triglyceride levels. Increasing "good" cholesterol levels. Lowering blood pressure. Lowering blood glucose levels. What is my activity plan? Your health care provider or certified diabetes educator can help you make a plan for the type and frequency of exercise that works for you. This is called your activity plan. Be sure to: Get at least 150 minutes of medium-intensity or high-intensity exercise each week. Exercises may include brisk walking, biking, or water aerobics. Do stretching and strengthening exercises, such as yoga or weight lifting, at least 2 times a week. Spread out your activity over at least 3 days of the week. Get  some form of physical activity each day. Do not go more than 2 days in a row without some kind of physical activity. Avoid being inactive for more than 90 minutes at a time. Take frequent breaks to walk or stretch. Choose exercises or activities that you enjoy. Set realistic goals. Start slowly and gradually increase your exercise intensity over time. How do I manage my diabetes during exercise? Monitor your blood glucose Check your blood glucose before and after exercising. If your blood glucose is: 240 mg/dL (13.3 mmol/L) or higher before you exercise, check your urine for ketones. These are chemicals created by the liver. If you have ketones in your urine, do not exercise until your blood glucose returns to normal. 100 mg/dL (5.6 mmol/L) or lower, eat a snack containing 15-20 grams of carbohydrate. Check your blood glucose 15 minutes after the snack to make sure that your glucose level is above 100 mg/dL (5.6 mmol/L) before you start your exercise. Know the symptoms of low blood glucose (hypoglycemia) and how to treat it. Your risk for hypoglycemia increases during and after exercise. Follow these tips and your health care provider's instructions Keep a carbohydrate snack that is fast-acting for use before, during, and after exercise to help prevent or treat hypoglycemia. Avoid injecting insulin into areas of the body that are going to be exercised. For example, avoid injecting insulin into: Your arms, when you are about to play tennis.  Your legs, when you are about to go jogging. Keep records of your exercise habits. Doing this can help you and your health care provider adjust your diabetes management plan as needed. Write down: Food that you eat before and after you exercise. Blood glucose levels before and after you exercise. The type and amount of exercise you have done. Work with your health care provider when you start a new exercise or activity. He or she may need to: Make sure that  the activity is safe for you. Adjust your insulin, other medicines, and food that you eat. Drink plenty of water while you exercise. This prevents loss of water (dehydration) and problems caused by a lot of heat in the body (heat stroke). Where to find more information American Diabetes Association: www.diabetes.org Summary Exercising regularly is important for overall health, especially for people who have diabetes mellitus. Exercising has many health benefits. It increases muscle strength and bone density and reduces body fat and stress. It also lowers and controls blood glucose. Your health care provider or certified diabetes educator can help you make an activity plan for the type and frequency of exercise that works for you. Work with your health care provider to make sure any new activity is safe for you. Also work with your health care provider to adjust your insulin, other medicines, and the food you eat. This information is not intended to replace advice given to you by your health care provider. Make sure you discuss any questions you have with your health care provider. Document Revised: 11/23/2018 Document Reviewed: 11/23/2018 Elsevier Patient Education  [place].

## 2021-03-08 NOTE — Progress Notes (Signed)
Acute Office Visit  Subjective:    Patient ID: Benjamin Conley, male    DOB: Jul 22, 1965, 55 y.o.   MRN: [id number]  Chief Complaint  Patient presents with   Rash    HPI Patient is in today for  a rash    Onset 3 months ago has tried triamcinolone cream with nystatin.  Also had appearance of vitiligo chronic.   Hemoglobin A1C was 7.9 on 02/23/21 . Ozempic was approved for prior authorization. Started ozempic 2 weeks ago. Blood sugars now are between  97- 138.  Denies any hypoglycemic episodes. Still taking Humalog 4 units with meals BID.  He is still on lantus 12 units daily. He has not decreased his lantus yet, was instructed to decrease lantus to 9 units. Hre has not done this yet.   Patient  denies any fever, body aches,chills, rash, chest pain, shortness of breath, nausea, vomiting, or diarrhea.  Denies dizziness, lightheadedness, pre syncopal or syncopal episodes.    Past Medical History:  Diagnosis Date   Acute posthemorrhagic anemia    Complication of anesthesia    c/o difficulty breathing after anesthesia   COPD (chronic obstructive pulmonary disease) (HCC)    Diabetes mellitus without complication ([place])    Hyperlipemia    Melena 08/21/2019   Paroxysmal supraventricular tachycardia (HCC) 08/19/2019   Postural dizziness with presyncope 08/21/2019   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   Tobacco abuse    Upper GI bleed 08/21/2019    Past Surgical History:  Procedure Laterality Date   BACK SURGERY     lumbar   COLONOSCOPY WITH PROPOFOL N/A 08/24/2019   Procedure: COLONOSCOPY WITH PROPOFOL;  Surgeon: [name], MD;  Location: [facility] ENDOSCOPY;  Service: Endoscopy;  Laterality: N/A;   ESOPHAGOGASTRODUODENOSCOPY (EGD) WITH PROPOFOL N/A 08/24/2019   Procedure: ESOPHAGOGASTRODUODENOSCOPY (EGD) WITH PROPOFOL;  Surgeon: [name], MD;  Location: [facility] ENDOSCOPY;  Service: Endoscopy;  Laterality: N/A;   STENT PLACEMENT VASCULAR ([place] [facility])  07/2019   stenosis of distal splenic  artery and stent placed   VISCERAL ANGIOGRAPHY N/A 08/06/2019   Procedure: VISCERAL ANGIOGRAPHY;  Surgeon: [name], MD;  Location: [place] [facility];  Service: Cardiovascular;  Laterality: N/A;    Family History  Problem Relation Age of Onset   Diabetes Mother    Diabetes Mellitus II Mother    Tuberculosis Father    Heart disease Father    Diabetes Sister    Diabetes Sister    Diabetes Sister    Diabetes Sister    Diabetes Brother    Diabetes Mellitus II Brother    Hyperlipidemia Brother    Cancer Maternal Aunt    Colon cancer Maternal Uncle    Prostate cancer Neg Hx     Social History   Socioeconomic History   Marital status: Single    Spouse name: Not on file   Number of children: Not on file   Years of education: Not on file   Highest education level: Not on file  Occupational History   Not on file  Tobacco Use   Smoking status: Former    Packs/day: 1.00    Years: 48.00    Pack years: 48.00    Types: Cigarettes    Quit date: 02/12/2020    Years since quitting: 1.0   Smokeless tobacco: Never  Vaping Use   Vaping Use: Never used  Substance and Sexual Activity   Alcohol use: No    Comment: Never been a problem   Drug use: No  Sexual activity: Yes  Other Topics Concern   Not on file  Social History Narrative   Single, lives alone, unemployed.   Social Determinants of Health   Financial Resource Strain: Low Risk    Difficulty of Paying Living Expenses: Not hard at all  Food Insecurity: No Food Insecurity   Worried About Charity fundraiser in the Last Year: Never true   [place] in the Last Year: Never true  Transportation Needs: No Transportation Needs   Lack of Transportation (Medical): No   Lack of Transportation (Non-Medical): No  Physical Activity: Insufficiently Active   Days of Exercise per Week: 4 days   Minutes of Exercise per Session: 20 min  Stress: No Stress Concern Present   Feeling of Stress : Not at all  Social  Connections: Unknown   Frequency of Communication with Friends and Family: Not on file   Frequency of Social Gatherings with Friends and Family: More than three times a week   Attends Religious Services: Not on Electrical engineer or Organizations: No   Attends Music therapist: Not on file   Marital Status: Not on file  Intimate Partner Violence: Not At Risk   Fear of Current or Ex-Partner: No   Emotionally Abused: No   Physically Abused: No   Sexually Abused: No    Outpatient Medications Prior to Visit  Medication Sig Dispense Refill   albuterol (VENTOLIN HFA) 108 (90 Base) MCG/ACT inhaler Inhale 2 puffs into the lungs every 6 (six) hours as needed for wheezing or shortness of breath. 6.7 g 3   aspirin 81 MG EC tablet Take 1 tablet (81 mg total) by mouth daily. 90 tablet 3   atorvastatin (LIPITOR) 40 MG tablet Take 1 tablet (40 mg total) by mouth daily. 90 tablet 3   blood glucose meter kit and supplies Dispense based on patient and insurance preference. Use up to four times daily as directed. (FOR ICD-10 E10.9, E11.9). 1 each 0   clopidogrel (PLAVIX) 75 MG tablet Take 1 tablet (75 mg total) by mouth daily. 90 tablet 3   insulin lispro (HUMALOG KWIKPEN) 100 UNIT/ML KwikPen Inject 4 Units into the skin 2 (two) times daily with a meal. Lunch and dinner 15 mL 11   Insulin Pen Needle 32G X 4 MM MISC Use as directed to administer insulin 100 each 2   metoprolol succinate (TOPROL-XL) 25 MG 24 hr tablet Take 0.5 tablets (12.5 mg total) by mouth daily. 90 tablet 1   nystatin-triamcinolone ointment (MYCOLOG) Apply 1 application topically 2 (two) times daily. 60 g 0   pantoprazole (PROTONIX) 20 MG tablet Take 1 tablet (20 mg total) by mouth daily. One hour before breakfast 90 tablet 0   Semaglutide, 2 MG/DOSE, 8 MG/3ML SOPN Inject 2 mg as directed once a week. 9 mL 1   sildenafil (VIAGRA) 100 MG tablet Take 1 tablet (100 mg total) by mouth daily as needed for erectile  dysfunction. 30 tablet 0   tamsulosin (FLOMAX) 0.4 MG CAPS capsule Take 1 capsule (0.4 mg total) by mouth daily. 90 capsule 0   insulin glargine (LANTUS) 100 UNIT/ML Solostar Pen Inject 12 Units into the skin at bedtime. 15 mL 3   No facility-administered medications prior to visit.    Allergies  Allergen Reactions   Wasp Venom Anaphylaxis   Wasp Venom Protein Anaphylaxis   Coffee Flavor Nausea And Vomiting    Patient states coffee gives him nausea and  stomach cramps   Onion Nausea And Vomiting    Review of Systems     Objective:    Physical Exam Constitutional:      General: He is not in acute distress.    Appearance: Normal appearance. He is well-developed. He is not ill-appearing, toxic-appearing or diaphoretic.  HENT:     Head: Normocephalic and atraumatic.     Right Ear: Hearing, tympanic membrane, ear canal and external ear normal.     Left Ear: Hearing, tympanic membrane, ear canal and external ear normal.     Nose: Nose normal.     Mouth/Throat:     Mouth: Mucous membranes are moist.     Pharynx: Uvula midline. No oropharyngeal exudate.  Eyes:     General: Lids are normal. No scleral icterus.       Right eye: No discharge.        Left eye: No discharge.     Conjunctiva/sclera: Conjunctivae normal.     Pupils: Pupils are equal, round, and reactive to light.  Neck:     Thyroid: No thyromegaly.     Vascular: Normal carotid pulses. No carotid bruit, hepatojugular reflux or JVD.     Trachea: Trachea and phonation normal. No tracheal tenderness or tracheal deviation.     Meningeal: Brudzinski's sign absent.  Cardiovascular:     Rate and Rhythm: Normal rate and regular rhythm.     Pulses: Normal pulses.     Heart sounds: Normal heart sounds, S1 normal and S2 normal. Heart sounds not distant. No murmur heard.   No friction rub. No gallop.  Pulmonary:     Effort: Pulmonary effort is normal. No accessory muscle usage or respiratory distress.     Breath sounds: Normal  breath sounds. No stridor. No wheezing or rales.  Chest:     Chest wall: No tenderness.  Abdominal:     General: Bowel sounds are normal. There is no distension.     Palpations: Abdomen is soft. There is no mass.     Tenderness: There is no abdominal tenderness. There is no guarding or rebound.  Musculoskeletal:        General: No tenderness or deformity. Normal range of motion.     Cervical back: Full passive range of motion without pain, normal range of motion and neck supple.  Lymphadenopathy:     Head:     Right side of head: No submental, submandibular, tonsillar, preauricular, posterior auricular or occipital adenopathy.     Left side of head: No submental, submandibular, tonsillar, preauricular, posterior auricular or occipital adenopathy.     Cervical: No cervical adenopathy.  Skin:    General: Skin is warm and dry.     Coloration: Skin is not pale.     Findings: Rash present. No erythema.     Nails: There is no clubbing.     Comments: Erythematous hyperpigmented skin under bilateral armpits.  Hypopigmented skin patches on arms, trunk and lower extremities.,   Neurological:     Mental Status: He is alert and oriented to person, place, and time.     GCS: GCS eye subscore is 4. GCS verbal subscore is 5. GCS motor subscore is 6.     Cranial Nerves: No cranial nerve deficit.     Sensory: No sensory deficit.     Motor: No abnormal muscle tone.     Coordination: Coordination normal.     Deep Tendon Reflexes: Reflexes are normal and symmetric. Reflexes normal.  Psychiatric:  Speech: Speech normal.        Behavior: Behavior normal.        Thought Content: Thought content normal.        Judgment: Judgment normal.    BP 140/80    Pulse (!) 112    Ht 5' 4.02" (1.626 m)    Wt 191 lb 6.4 oz (86.8 kg)    SpO2 98%    BMI 32.84 kg/m  Wt Readings from Last 3 Encounters:  03/08/21 191 lb 6.4 oz (86.8 kg)  02/21/21 188 lb (85.3 kg)  02/05/21 187 lb 9.6 oz (85.1 kg)    There  are no preventive care reminders to display for this patient.   There are no preventive care reminders to display for this patient.   Lab Results  Component Value Date   TSH 1.370 02/23/2021   Lab Results  Component Value Date   WBC 7.1 03/08/2021   HGB 11.3 (L) 03/08/2021   HCT 33.9 (L) 03/08/2021   MCV 91.1 03/08/2021   PLT 431.0 (H) 03/08/2021   Lab Results  Component Value Date   NA 140 03/08/2021   K 3.9 03/08/2021   CO2 28 03/08/2021   GLUCOSE 193 (H) 03/08/2021   BUN 14 03/08/2021   CREATININE 0.73 03/08/2021   BILITOT 0.3 03/08/2021   ALKPHOS 78 03/08/2021   AST 13 03/08/2021   ALT 18 03/08/2021   PROT 6.4 03/08/2021   ALBUMIN 3.9 03/08/2021   CALCIUM 8.8 03/08/2021   ANIONGAP 8 11/03/2020   EGFR 105 02/23/2021   GFR 102.52 03/08/2021   Lab Results  Component Value Date   CHOL 138 02/23/2021   Lab Results  Component Value Date   HDL 41 02/23/2021   Lab Results  Component Value Date   LDLCALC 74 02/23/2021   Lab Results  Component Value Date   TRIG 126 02/23/2021   Lab Results  Component Value Date   CHOLHDL 3.4 02/23/2021   Lab Results  Component Value Date   HGBA1C 7.9 (H) 02/23/2021       Assessment & Plan:   Problem List Items Addressed This Visit       Endocrine   Diabetes mellitus without complication (HCC)   Relevant Medications   insulin glargine (LANTUS) 100 UNIT/ML Solostar Pen   Other Relevant Orders   Hemoglobin A1c   Comprehensive metabolic panel (Completed)   CBC with Differential/Platelet (Completed)   Other Visit Diagnoses     History of elevated prostate specific antigen (PSA)    -  Primary   Vitiligo       Relevant Orders   Hemoglobin A1c   Comprehensive metabolic panel (Completed)   CBC with Differential/Platelet (Completed)   B Nat Peptide (Completed)   Ambulatory referral to Dermatology   Yeast infection of the skin       Relevant Medications   fluconazole (DIFLUCAN) 150 MG tablet   Other Relevant  Orders   Ambulatory referral to Dermatology   Itch of skin       Relevant Medications   triamcinolone cream (KENALOG) 0.1 %   Other Relevant Orders   Ambulatory referral to Dermatology        Meds ordered this encounter  Medications   insulin glargine (LANTUS) 100 UNIT/ML Solostar Pen    Sig: Inject 9 Units into the skin at bedtime.    Dispense:  15 mL    Refill:  0    Discontinue metformin from list he could not tolerate.  fluconazole (DIFLUCAN) 150 MG tablet    Sig: Take 1 tablet (150 mg total) by mouth as directed. Take one tablet by mouth on day 1. May repeat dose of one tablet by mouth on day four.    Dispense:  2 tablet    Refill:  0   triamcinolone cream (KENALOG) 0.1 %    Sig: Apply 1 application topically 2 (two) times daily.    Dispense:  30 g    Refill:  0   Medications Discontinued During This Encounter  Medication Reason   insulin glargine (LANTUS) 100 UNIT/ML Solostar Pen   Still need results from Dr. Weber Cooks carotid doppler record request completed by patient.   Red Flags discussed. The patient was given clear instructions to go to ER or return to medical center if any red flags develop, symptoms do not improve, worsen or new problems develop. They verbalized understanding.   Return in about 2 months (around 05/08/2021), or if symptoms worsen or fail to improve, for at any time for any worsening symptoms, Go to Emergency room/ urgent care if worse.      Marcille Buffy, [name]

## 2021-03-09 ENCOUNTER — Encounter: Payer: Self-pay | Admitting: Gastroenterology

## 2021-03-09 ENCOUNTER — Encounter: Payer: Self-pay | Admitting: Internal Medicine

## 2021-03-09 NOTE — Progress Notes (Signed)
CBC shows anemia persist elevated platelets, I would recommend that he call Dr. Aletha Halim office for follow-up for chronic anemia.  He should also have a follow-up with Dr. Allen Norris his gastroenterologist for reevaluation for any bleeding or possible need for repeat endoscopy or colonoscopy.  Please have patient call both of these offices to set up an appointment within the near future. BNP  is within normal limits. CMP shows elevated blood glucose however he was not fasting for this lab yesterday.  Continue glucose management and keep follow-up for 2 months for repeat hemoglobin A1c and further labs. Records request yesterday for carotid ultrasound that was done at [facility] Dr. Lannie Fields office did not have these results and they are not available to see in care everywhere, please let me know when those results return.

## 2021-03-11 ENCOUNTER — Encounter: Payer: Self-pay | Admitting: Adult Health

## 2021-03-13 NOTE — Progress Notes (Signed)
Records request sheet faxed

## 2021-03-14 ENCOUNTER — Encounter: Payer: Self-pay | Admitting: Oncology

## 2021-03-15 DIAGNOSIS — H903 Sensorineural hearing loss, bilateral: Secondary | ICD-10-CM | POA: Diagnosis not present

## 2021-03-15 NOTE — Telephone Encounter (Signed)
Error

## 2021-03-16 DIAGNOSIS — L300 Nummular dermatitis: Secondary | ICD-10-CM | POA: Diagnosis not present

## 2021-03-21 ENCOUNTER — Other Ambulatory Visit: Payer: Self-pay

## 2021-03-21 ENCOUNTER — Encounter: Payer: Medicare Other | Attending: Adult Health | Admitting: Dietician

## 2021-03-21 ENCOUNTER — Encounter: Payer: Self-pay | Admitting: Dietician

## 2021-03-21 VITALS — BP 110/70 | Ht 64.0 in | Wt 190.8 lb

## 2021-03-21 DIAGNOSIS — E119 Type 2 diabetes mellitus without complications: Secondary | ICD-10-CM | POA: Insufficient documentation

## 2021-03-21 DIAGNOSIS — Z794 Long term (current) use of insulin: Secondary | ICD-10-CM

## 2021-03-21 DIAGNOSIS — E1165 Type 2 diabetes mellitus with hyperglycemia: Secondary | ICD-10-CM

## 2021-03-21 NOTE — Progress Notes (Signed)
Diabetes Self-Management Education  Visit Type:  Follow-up  Appt. Start Time: 1325 Appt. End Time: 1400  03/21/2021  Benjamin Conley, identified by name and date of birth, is a 56 y.o. male with a diagnosis of Diabetes: Type 2   ASSESSMENT  Blood pressure 110/70, height _0  (1.626 m), weight 190 lb 12.8 oz (86.5 kg). Body mass index is 32.75 kg/m.    Diabetes Self-Management Education - 22/02/54 2706       Complications   How often do you check your blood sugar? > 4 times/day   Freestyle Libre continuous glucose monitor   Fasting Blood glucose range (mg/dL) --   82% in range, 18% above goal, and 0% below for the past week   Have you had a dilated eye exam in the past 12 months? Yes    Have you had a dental exam in the past 12 months? Yes    Are you checking your feet? Yes    How many days per week are you checking your feet? 1      Dietary Intake   Breakfast 1-2 meals, occasionally 3, + 3-4 snacks daily -- grapes/ other fruits, or cheese and crackers. Breakfast -- often skips, sometimes grits and eggs or cheese, or toast, or oats    Lunch sandwich with meat and cheese, occ a few chips on the sandwich    Dinner meat sometimes with vegetables; 1/10 baked steak + mac and cheese    Beverage(s) water only      Exercise   Exercise Type ADL's   walks to mailbox and back 1-2x daily     Patient Education   Nutrition management  Role of diet in the treatment of diabetes and the relationship between the three main macronutrients and blood glucose level;Meal timing in regards to the patients' current diabetes medication.;Meal options for control of blood glucose level and chronic complications.;Other (comment)   basic meal planning   Physical activity and exercise  Role of exercise on diabetes management, blood pressure control and cardiac health.    Acute complications Taught treatment of hypoglycemia - the 15 rule.      Post-Education Assessment   Patient understands the diabetes  disease and treatment process. Demonstrates understanding / competency    Patient understands incorporating nutritional management into lifestyle. Demonstrates understanding / competency    Patient undertands incorporating physical activity into lifestyle. Demonstrates understanding / competency    Patient understands using medications safely. Demonstrates understanding / competency    Patient understands monitoring blood glucose, interpreting and using results Demonstrates understanding / competency    Patient understands prevention, detection, and treatment of acute complications. Demonstrates understanding / competency    Patient understands prevention, detection, and treatment of chronic complications. Demonstrates understanding / competency    Patient understands how to develop strategies to address psychosocial issues. Demonstrates understanding / competency    Patient understands how to develop strategies to promote health/change behavior. Demonstrates understanding / competency      Outcomes   Program Status Completed             Learning Objective:  Patient will have a greater understanding of diabetes self-management. Patient education plan is to attend individual and/or group sessions per assessed needs and concerns.  Additional Notes: Patient's time in target range has improved from 74% 2 weeks ago to 82% over the past week.  He has made some dietary changes to improve BG control, and he plans to increase physical activity by going on  hunting outings (waiting to receive license). Advised keeping snacks and low BG treatment with him when hunting. Reviewed basic nutrition goals of eating at regular intervals and balancing controlled portions of carbs with protein and low carb vegetables. Patient often eats single foods such as meat as a meal or fruit for a snack.    Expected Outcomes:  Demonstrated interest in learning. Expect positive outcomes  Education material provided:   Quick and Balanced Meal Ideas Diabetes-Friendly Recipes and Menus  If problems or questions, patient to contact team via:  Phone  Future DSME appointment: -  to schedule as needed

## 2021-03-21 NOTE — Progress Notes (Signed)
03/22/2021 3:10 PM   Benjamin Conley 1965-03-28 [id number]  Referring provider: Doreen Beam, [name] [address],  [place] [address]  Chief Complaint  Patient presents with   Elevated PSA   Erectile Dysfunction   Benign Prostatic Hypertrophy   Urological history: 1. ED -contributing factors of age, BPH, COPD, DM, HTN, HLD and smoking -SHIM 0 -managed with sildenafil 100 mg, on-demand-dosing  2. BPH with LU TS -PSA 2.21 in 10/2019 -cysto 01/2020 prominent lateral lobe enlargement of prostate and moderate bladder neck elevation -I PSS 7/2 -PVR 37 mL 07/2020 -managed with tamsulosin 0.4 mg daily   3. Ejaculatory disorder -retrograde ejaculation secondary to BPH and tamsulosin   HPI: Benjamin Conley is a 56 y.o. male who presents today for elevated PSA.  PSA Trend  4.4 02/2021  2.21 10/2019   He has no urinary complaints at this visit.  Patient denies any modifying or aggravating factors.  Patient denies any gross hematuria, dysuria or suprapubic/flank pain.  Patient denies any fevers, chills, nausea or vomiting.    IPSS     Row Name 03/22/21 1400         International Prostate Symptom Score   How often have you had the sensation of not emptying your bladder? Less than half the time     How often have you had to urinate less than every two hours? Not at All     How often have you found you stopped and started again several times when you urinated? About half the time     How often have you found it difficult to postpone urination? Not at All     How often have you had a weak urinary stream? Not at All     How often have you had to strain to start urination? Less than half the time     How many times did you typically get up at night to urinate? None     Total IPSS Score 7       Quality of Life due to urinary symptoms   If you were to spend the rest of your life with your urinary condition just the way it is now how would you feel  about that? Mostly Satisfied               Score:  1-7 Mild 8-19 Moderate 20-35 Severe  PMH: Past Medical History:  Diagnosis Date   Acute posthemorrhagic anemia    Complication of anesthesia    c/o difficulty breathing after anesthesia   COPD (chronic obstructive pulmonary disease) (HCC)    Diabetes mellitus without complication ([facility])    Hyperlipemia    Melena 08/21/2019   Paroxysmal supraventricular tachycardia (HCC) 08/19/2019   Postural dizziness with presyncope 08/21/2019   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   Tobacco abuse    Upper GI bleed 08/21/2019    Surgical History: Past Surgical History:  Procedure Laterality Date   BACK SURGERY     lumbar   COLONOSCOPY WITH PROPOFOL N/A 08/24/2019   Procedure: COLONOSCOPY WITH PROPOFOL;  Surgeon: [name], MD;  Location: [facility] ENDOSCOPY;  Service: Endoscopy;  Laterality: N/A;   ESOPHAGOGASTRODUODENOSCOPY (EGD) WITH PROPOFOL N/A 08/24/2019   Procedure: ESOPHAGOGASTRODUODENOSCOPY (EGD) WITH PROPOFOL;  Surgeon: [name], MD;  Location: [facility] ENDOSCOPY;  Service: Endoscopy;  Laterality: N/A;   STENT PLACEMENT VASCULAR ([facility])  07/2019   stenosis of distal splenic artery and stent placed   VISCERAL ANGIOGRAPHY N/A 08/06/2019  Procedure: VISCERAL ANGIOGRAPHY;  Surgeon: [name], MD;  Location: [facility];  Service: Cardiovascular;  Laterality: N/A;    Home Medications:  Allergies as of 03/22/2021       Reactions   Wasp Venom Anaphylaxis   Wasp Venom Protein Anaphylaxis   Coffee Flavor Nausea And Vomiting   Patient states coffee gives him nausea and stomach cramps   Onion Nausea And Vomiting        Medication List        Accurate as of March 22, 2021  3:10 PM. If you have any questions, ask your nurse or doctor.          albuterol 108 (90 Base) MCG/ACT inhaler Commonly known as: VENTOLIN HFA Inhale 2 puffs into the lungs every 6 (six) hours as needed for wheezing or shortness of breath.    aspirin 81 MG EC tablet Take 1 tablet (81 mg total) by mouth daily.   atorvastatin 40 MG tablet Commonly known as: LIPITOR Take 1 tablet (40 mg total) by mouth daily.   blood glucose meter kit and supplies Dispense based on patient and insurance preference. Use up to four times daily as directed. (FOR ICD-10 E10.9, E11.9).   clopidogrel 75 MG tablet Commonly known as: PLAVIX Take 1 tablet (75 mg total) by mouth daily.   fluconazole 150 MG tablet Commonly known as: DIFLUCAN Take 1 tablet (150 mg total) by mouth as directed. Take one tablet by mouth on day 1. May repeat dose of one tablet by mouth on day four.   insulin glargine 100 UNIT/ML Solostar Pen Commonly known as: LANTUS Inject 9 Units into the skin at bedtime.   insulin lispro 100 UNIT/ML KwikPen Commonly known as: HumaLOG KwikPen Inject 4 Units into the skin 2 (two) times daily with a meal. Lunch and dinner   Insulin Pen Needle 32G X 4 MM Misc Use as directed to administer insulin   metoprolol succinate 25 MG 24 hr tablet Commonly known as: TOPROL-XL Take 0.5 tablets (12.5 mg total) by mouth daily.   nystatin-triamcinolone ointment Commonly known as: MYCOLOG Apply 1 application topically 2 (two) times daily.   pantoprazole 20 MG tablet Commonly known as: Protonix Take 1 tablet (20 mg total) by mouth daily. One hour before breakfast   Semaglutide (2 MG/DOSE) 8 MG/3ML Sopn Inject 2 mg as directed once a week.   sildenafil 100 MG tablet Commonly known as: VIAGRA Take 1 tablet (100 mg total) by mouth daily as needed for erectile dysfunction.   tamsulosin 0.4 MG Caps capsule Commonly known as: FLOMAX Take 1 capsule (0.4 mg total) by mouth daily.   triamcinolone cream 0.1 % Commonly known as: KENALOG Apply 1 application topically 2 (two) times daily.        Allergies:  Allergies  Allergen Reactions   Wasp Venom Anaphylaxis   Wasp Venom Protein Anaphylaxis   Coffee Flavor Nausea And Vomiting     Patient states coffee gives him nausea and stomach cramps   Onion Nausea And Vomiting    Family History: Family History  Problem Relation Age of Onset   Diabetes Mother    Diabetes Mellitus II Mother    Tuberculosis Father    Heart disease Father    Diabetes Sister    Diabetes Sister    Diabetes Sister    Diabetes Sister    Diabetes Brother    Diabetes Mellitus II Brother    Hyperlipidemia Brother    Cancer Maternal Aunt  Colon cancer Maternal Uncle    Prostate cancer Neg Hx     Social History:  reports that he quit smoking about 13 months ago. His smoking use included cigarettes. He has a 48.00 pack-year smoking history. He has never used smokeless tobacco. He reports that he does not drink alcohol and does not use drugs.   Physical Exam: BP 123/78    Pulse (!) 108    Ht 5' 5" (1.651 m)    Wt 187 lb (84.8 kg)    BMI 31.12 kg/m   Constitutional:  Well nourished. Alert and oriented, No acute distress. HEENT: [place] AT, mask in place.  Trachea midline Cardiovascular: No clubbing, cyanosis, or edema. Respiratory: Normal respiratory effort, no increased work of breathing. Neurologic: Grossly intact, no focal deficits, moving all 4 extremities. Psychiatric: Normal mood and affect.   Laboratory data: Recent Results (from the past 2160 hour(s))  Hemoglobin A1c     Status: Abnormal   Collection Time: 02/23/21  3:31 PM  Result Value Ref Range   Hgb A1c MFr Bld 7.9 (H) 4.8 - 5.6 %    Comment:          Prediabetes: 5.7 - 6.4          Diabetes: >6.4          Glycemic control for adults with diabetes: <7.0    Est. average glucose Bld gHb Est-mCnc 180 mg/dL  Comprehensive metabolic panel     Status: Abnormal   Collection Time: 02/23/21  3:31 PM  Result Value Ref Range   Glucose 126 (H) 70 - 99 mg/dL   BUN 13 6 - 24 mg/dL   Creatinine, Ser 0.79 0.76 - 1.27 mg/dL   eGFR 105 >59 mL/min/1.73   BUN/Creatinine Ratio 16 9 - 20   Sodium 137 134 - 144 mmol/L   Potassium 4.4 3.5 - 5.2  mmol/L   Chloride 100 96 - 106 mmol/L   CO2 25 20 - 29 mmol/L   Calcium 9.7 8.7 - 10.2 mg/dL   Total Protein 7.0 6.0 - 8.5 g/dL   Albumin 4.5 3.8 - 4.9 g/dL   Globulin, Total 2.5 1.5 - 4.5 g/dL   Albumin/Globulin Ratio 1.8 1.2 - 2.2   Bilirubin Total 0.4 0.0 - 1.2 mg/dL   Alkaline Phosphatase 76 44 - 121 IU/L   AST 17 0 - 40 IU/L   ALT 25 0 - 44 IU/L  PSA     Status: Abnormal   Collection Time: 02/23/21  3:31 PM  Result Value Ref Range   Prostate Specific Ag, Serum 4.4 (H) 0.0 - 4.0 ng/mL    Comment: Roche ECLIA methodology. According to the American Urological Association, Serum PSA should decrease and remain at undetectable levels after radical prostatectomy. The AUA defines biochemical recurrence as an initial PSA value 0.2 ng/mL or greater followed by a subsequent confirmatory PSA value 0.2 ng/mL or greater. Values obtained with different assay methods or kits cannot be used interchangeably. Results cannot be interpreted as absolute evidence of the presence or absence of malignant disease.   Lipid panel     Status: None   Collection Time: 02/23/21  3:31 PM  Result Value Ref Range   Cholesterol, Total 138 100 - 199 mg/dL   Triglycerides 126 0 - 149 mg/dL   HDL 41 >39 mg/dL   VLDL Cholesterol Cal 23 5 - 40 mg/dL   LDL Chol Calc (NIH) 74 0 - 99 mg/dL   Chol/HDL Ratio 3.4 0.0 - 5.0  ratio    Comment:                                   T. Chol/HDL Ratio                                             Men  Women                               1/2 Avg.Risk  3.4    3.3                                   Avg.Risk  5.0    4.4                                2X Avg.Risk  9.6    7.1                                3X Avg.Risk 23.4   11.0   VITAMIN D 25 Hydroxy (Vit-D Deficiency, Fractures)     Status: Abnormal   Collection Time: 02/23/21  3:31 PM  Result Value Ref Range   Vit D, 25-Hydroxy 21.3 (L) 30.0 - 100.0 ng/mL    Comment: Vitamin D deficiency has been defined by the [place] practice guideline as a level of serum 25-OH vitamin D less than 20 ng/mL (1,2). The Endocrine Society went on to further define vitamin D insufficiency as a level between 21 and 29 ng/mL (2). 1. IOM (Institute of Medicine). 2010. Dietary reference    intakes for calcium and D. [place]: The    [employer]. 2. Holick MF, Binkley [place], Bischoff-Ferrari HA, et al.    Evaluation, treatment, and prevention of vitamin D    deficiency: an Endocrine Society clinical practice    guideline. JCEM. 2011 Jul; 96(7):1911-30.   Magnesium     Status: None   Collection Time: 02/23/21  3:31 PM  Result Value Ref Range   Magnesium 2.1 1.6 - 2.3 mg/dL  TSH     Status: None   Collection Time: 02/23/21  3:31 PM  Result Value Ref Range   TSH 1.370 0.450 - 4.500 uIU/mL  Comprehensive metabolic panel     Status: Abnormal   Collection Time: 03/08/21  2:03 PM  Result Value Ref Range   Sodium 140 135 - 145 mEq/L   Potassium 3.9 3.5 - 5.1 mEq/L   Chloride 105 96 - 112 mEq/L   CO2 28 19 - 32 mEq/L   Glucose, Bld 193 (H) 70 - 99 mg/dL   BUN 14 6 - 23 mg/dL   Creatinine, Ser 0.73 0.40 - 1.50 mg/dL   Total Bilirubin 0.3 0.2 - 1.2 mg/dL   Alkaline Phosphatase 78 39 - 117 U/L   AST 13 0 - 37 U/L   ALT 18 0 - 53 U/L   Total Protein 6.4 6.0 - 8.3 g/dL   Albumin 3.9 3.5 - 5.2 g/dL   GFR 102.52 >60.00 mL/min  Comment: Calculated using the CKD-EPI Creatinine Equation (2021)   Calcium 8.8 8.4 - 10.5 mg/dL  CBC with Differential/Platelet     Status: Abnormal   Collection Time: 03/08/21  2:03 PM  Result Value Ref Range   WBC 7.1 4.0 - 10.5 K/uL   RBC 3.73 (L) 4.22 - 5.81 Mil/uL   Hemoglobin 11.3 (L) 13.0 - 17.0 g/dL   HCT 33.9 (L) 39.0 - 52.0 %   MCV 91.1 78.0 - 100.0 fl   MCHC 33.2 30.0 - 36.0 g/dL   RDW 14.1 11.5 - 15.5 %   Platelets 431.0 (H) 150.0 - 400.0 K/uL   Neutrophils Relative % 45.6 43.0 - 77.0 %   Lymphocytes Relative 44.7 12.0 - 46.0 %    Monocytes Relative 6.8 3.0 - 12.0 %   Eosinophils Relative 1.7 0.0 - 5.0 %   Basophils Relative 1.2 0.0 - 3.0 %   Neutro Abs 3.2 1.4 - 7.7 K/uL   Lymphs Abs 3.2 0.7 - 4.0 K/uL   Monocytes Absolute 0.5 0.1 - 1.0 K/uL   Eosinophils Absolute 0.1 0.0 - 0.7 K/uL   Basophils Absolute 0.1 0.0 - 0.1 K/uL  B Nat Peptide     Status: None   Collection Time: 03/08/21  2:03 PM  Result Value Ref Range   Pro B Natriuretic peptide (BNP) 34.0 0.0 - 100.0 pg/mL  I have reviewed the labs.  Assessment & Plan:    1. Elevated PSA -We reviewed the implications of an elevated PSA and the uncertainty surrounding it. In general, a man's PSA increases with age and is produced by both normal and cancerous prostate tissue. The differential diagnosis for elevated PSA includes BPH, prostate cancer, infection, recent intercourse/ejaculation, recent urethroscopic manipulation (foley placement/cystoscopy) or trauma, and prostatitis.  -Management of an elevated PSA can include observation or prostate biopsy and we discussed this in detail. Our goal is to detect clinically significant prostate cancers, and manage with either active surveillance, surgery, or radiation for localized disease. Risks of prostate biopsy include bleeding, infection (including life threatening sepsis), pain, and lower urinary symptoms. Hematuria, hematospermia, and blood in the stool are all common after biopsy and can persist up to 4 weeks.   -PSA repeated today, he will make his decision based on its results  2.  Erectile dysfunction -patient has recently split from partner, so not as sexually active  3. BPH with LU TS -symptoms are stable -continue tamsulosin 0.4 mg daily    Zara Council, PA-C  [facility] [address] [place], [place] [address] [phone]

## 2021-03-22 ENCOUNTER — Ambulatory Visit (INDEPENDENT_AMBULATORY_CARE_PROVIDER_SITE_OTHER): Payer: Medicare Other | Admitting: Urology

## 2021-03-22 ENCOUNTER — Encounter: Payer: Self-pay | Admitting: Adult Health

## 2021-03-22 ENCOUNTER — Encounter: Payer: Self-pay | Admitting: Urology

## 2021-03-22 ENCOUNTER — Ambulatory Visit: Payer: Medicare Other | Admitting: Adult Health

## 2021-03-22 ENCOUNTER — Encounter: Payer: Self-pay | Admitting: Oncology

## 2021-03-22 ENCOUNTER — Other Ambulatory Visit: Payer: Self-pay

## 2021-03-22 VITALS — BP 123/78 | HR 108 | Ht 65.0 in | Wt 187.0 lb

## 2021-03-22 DIAGNOSIS — N401 Enlarged prostate with lower urinary tract symptoms: Secondary | ICD-10-CM

## 2021-03-22 DIAGNOSIS — N3943 Post-void dribbling: Secondary | ICD-10-CM | POA: Diagnosis not present

## 2021-03-22 DIAGNOSIS — N521 Erectile dysfunction due to diseases classified elsewhere: Secondary | ICD-10-CM | POA: Diagnosis not present

## 2021-03-22 DIAGNOSIS — N5319 Other ejaculatory dysfunction: Secondary | ICD-10-CM

## 2021-03-22 DIAGNOSIS — E1169 Type 2 diabetes mellitus with other specified complication: Secondary | ICD-10-CM

## 2021-03-22 DIAGNOSIS — R972 Elevated prostate specific antigen [PSA]: Secondary | ICD-10-CM | POA: Diagnosis not present

## 2021-03-23 LAB — PSA: Prostate Specific Ag, Serum: 3.7 ng/mL (ref 0.0–4.0)

## 2021-03-27 ENCOUNTER — Encounter: Payer: Self-pay | Admitting: Oncology

## 2021-03-30 NOTE — Progress Notes (Signed)
Cardiology Office Note    Date:  04/04/2021   ID:  Benjamin Conley, DOB 09-Jul-1965, MRN [id number]  PCP:  Doreen Beam, [name]  Cardiologist:  [name], MD  Electrophysiologist:  None   Chief Complaint: Follow-up  History of Present Illness:   Benjamin Conley is a 56 y.o. male with history of syncope felt to be vasovagal in etiology, diabetes, splenic infarct status post stent placement, HLD, COPD with prior tobacco use of 40+ years, iron deficiency anemia, and GERD who presents for follow-up of syncope  He was previously evaluated for exertional dyspnea and chest pain with Lexiscan MPI in 08/2019 showed a small in size, mild in severity, minimally reversible defect involving the mid anteroseptal and apical anterior segments most consistent with artifact and was overall a low risk study.  There was no significant coronary artery calcification on CT attenuated corrected images.  Echo on 09/28/2019, to confirm EF, demonstrated an LV systolic function of 55 to 60%, no regional wall motion abnormalities, normal LV diastolic function parameters, normal RV systolic function and ventricular cavity size, trivial mitral regurgitation, and an estimated right atrial pressure of 8 mmHg.  He was started on Toprol-XL due to elevated heart rates.  He reported 2 episodes of syncope in 04/2020.  The first episode occurred while having a BM with the second episode occurring while standing up about to urinate.  It was recommended he stop Toprol-XL.  Subsequent outpatient cardiac monitoring in 04/2020 showed a predominant rhythm of sinus with an average rate of 103 bpm (range 82 to 210 bpm), 3 episodes of NSVT were noted with the fastest interval lasting 4 beats with a maximum rate of 210 bpm and the longest interval lasting 14 beats, and 2 episodes of SVT with the longest lasting 17 beats.  Repeat outpatient cardiac monitoring in 10/2020, performed for concerns of A. fib on the patient's Fitbit,  showed a predominant rhythm of sinus with an average rate of 99 bpm (72 to 174 bpm), 1 run of NSVT lasting 5 beats with a maximum rate of 164 bpm, and 3 episodes of SVT were noted with the longest episode lasting 17 beats.  Some episodes of SVT may have been atrial tachycardia with variable AV block.  He was last seen in the office in 11/2020 and continued to deal with vertigo and had no new cardiac concerns.  He has been restarted on low-dose Toprol-XL in 02/2021 by outside office, due to a sinus tachycardia.  He comes in doing well from a cardiac perspective and is without symptoms of angina or decompensation.  He does continue to note intermittent dizziness, particularly with positional head movements or when closing his eyes.  Symptoms of dizziness are exacerbated by posterior neck pain and headache.  Longstanding tinnitus remains unchanged.  He indicates ENT indicated he did not have vertigo.  He indicates he does not feel like a room spinning sensation.  No lower extremity swelling or orthopnea.  No further syncopal episodes.  He is tolerating lower dose metoprolol without issues at this time.   Labs independently reviewed: 02/2021 - Hgb 11.3, PLT 431, potassium 3.9, BUN 14, serum creatinine 0.73, albumin 3.9, AST/ALT normal, TSH normal, magnesium 2.1, TC 138, TG 126, HDL 41, LDL 74, A1c 7.9  Past Medical History:  Diagnosis Date   Acute posthemorrhagic anemia    Complication of anesthesia    c/o difficulty breathing after anesthesia   COPD (chronic obstructive pulmonary disease) ([place])    Diabetes  mellitus without complication ([place])    Hyperlipemia    Melena 08/21/2019   Paroxysmal supraventricular tachycardia (HCC) 08/19/2019   Postural dizziness with presyncope 08/21/2019   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   Tobacco abuse    Upper GI bleed 08/21/2019    Past Surgical History:  Procedure Laterality Date   BACK SURGERY     lumbar   COLONOSCOPY WITH PROPOFOL N/A 08/24/2019   Procedure:  COLONOSCOPY WITH PROPOFOL;  Surgeon: [name], MD;  Location: [facility] ENDOSCOPY;  Service: Endoscopy;  Laterality: N/A;   ESOPHAGOGASTRODUODENOSCOPY (EGD) WITH PROPOFOL N/A 08/24/2019   Procedure: ESOPHAGOGASTRODUODENOSCOPY (EGD) WITH PROPOFOL;  Surgeon: [name], MD;  Location: [facility] ENDOSCOPY;  Service: Endoscopy;  Laterality: N/A;   STENT PLACEMENT VASCULAR ([facility])  07/2019   stenosis of distal splenic artery and stent placed   VISCERAL ANGIOGRAPHY N/A 08/06/2019   Procedure: VISCERAL ANGIOGRAPHY;  Surgeon: [name], MD;  Location: [place] CV LAB;  Service: Cardiovascular;  Laterality: N/A;    Current Medications: Current Meds  Medication Sig   albuterol (VENTOLIN HFA) 108 (90 Base) MCG/ACT inhaler Inhale 2 puffs into the lungs every 6 (six) hours as needed for wheezing or shortness of breath.   aspirin 81 MG EC tablet Take 1 tablet (81 mg total) by mouth daily.   atorvastatin (LIPITOR) 40 MG tablet Take 1 tablet (40 mg total) by mouth daily.   blood glucose meter kit and supplies Dispense based on patient and insurance preference. Use up to four times daily as directed. (FOR ICD-10 E10.9, E11.9).   clopidogrel (PLAVIX) 75 MG tablet Take 1 tablet (75 mg total) by mouth daily.   insulin glargine (LANTUS) 100 UNIT/ML Solostar Pen Inject 9 Units into the skin at bedtime.   insulin lispro (HUMALOG KWIKPEN) 100 UNIT/ML KwikPen Inject 4 Units into the skin 2 (two) times daily with a meal. Lunch and dinner   Insulin Pen Needle 32G X 4 MM MISC Use as directed to administer insulin   metoprolol succinate (TOPROL-XL) 25 MG 24 hr tablet Take 0.5 tablets (12.5 mg total) by mouth daily.   nystatin-triamcinolone ointment (MYCOLOG) Apply 1 application topically 2 (two) times daily.   pantoprazole (PROTONIX) 20 MG tablet Take 1 tablet (20 mg total) by mouth daily. One hour before breakfast   Semaglutide, 2 MG/DOSE, 8 MG/3ML SOPN Inject 2 mg as directed once a week.   sildenafil  (VIAGRA) 100 MG tablet Take 1 tablet (100 mg total) by mouth daily as needed for erectile dysfunction.   tamsulosin (FLOMAX) 0.4 MG CAPS capsule Take 1 capsule (0.4 mg total) by mouth daily.   triamcinolone cream (KENALOG) 0.1 % Apply 1 application topically 2 (two) times daily.    Allergies:   Wasp venom, Wasp venom protein, Coffee flavor, and Onion   Social History   Socioeconomic History   Marital status: Single    Spouse name: Not on file   Number of children: Not on file   Years of education: Not on file   Highest education level: Not on file  Occupational History   Not on file  Tobacco Use   Smoking status: Former    Packs/day: 1.00    Years: 48.00    Pack years: 48.00    Types: Cigarettes    Quit date: 02/12/2020    Years since quitting: 1.1   Smokeless tobacco: Never  Vaping Use   Vaping Use: Never used  Substance and Sexual Activity   Alcohol use: No    Comment:  Never been a problem   Drug use: No   Sexual activity: Yes  Other Topics Concern   Not on file  Social History Narrative   Single, lives alone, unemployed.   Social Determinants of Health   Financial Resource Strain: Low Risk    Difficulty of Paying Living Expenses: Not hard at all  Food Insecurity: No Food Insecurity   Worried About Charity fundraiser in the Last Year: Never true   [place] in the Last Year: Never true  Transportation Needs: No Transportation Needs   Lack of Transportation (Medical): No   Lack of Transportation (Non-Medical): No  Physical Activity: Insufficiently Active   Days of Exercise per Week: 4 days   Minutes of Exercise per Session: 20 min  Stress: No Stress Concern Present   Feeling of Stress : Not at all  Social Connections: Unknown   Frequency of Communication with Friends and Family: Not on file   Frequency of Social Gatherings with Friends and Family: More than three times a week   Attends Religious Services: Not on Electrical engineer or  Organizations: No   Attends Archivist Meetings: Not on file   Marital Status: Not on file     Family History:  The patient's family history includes Cancer in his maternal aunt; Colon cancer in his maternal uncle; Diabetes in his brother, mother, sister, sister, sister, and sister; Diabetes Mellitus II in his brother and mother; Heart disease in his father; Hyperlipidemia in his brother; Tuberculosis in his father. There is no history of Prostate cancer.  ROS:   Review of Systems  Constitutional:  Positive for malaise/fatigue. Negative for chills, diaphoresis, fever and weight loss.  HENT:  Positive for tinnitus. Negative for congestion.   Eyes:  Negative for discharge and redness.  Respiratory:  Negative for cough, sputum production, shortness of breath and wheezing.   Cardiovascular:  Negative for chest pain, palpitations, orthopnea, claudication, leg swelling and PND.  Gastrointestinal:  Negative for abdominal pain, blood in stool, heartburn, melena, nausea and vomiting.  Musculoskeletal:  Positive for neck pain. Negative for falls and myalgias.  Skin:  Negative for rash.  Neurological:  Positive for dizziness, weakness and headaches. Negative for tingling, tremors, sensory change, speech change, focal weakness, seizures and loss of consciousness.  Endo/Heme/Allergies:  Does not bruise/bleed easily.  Psychiatric/Behavioral:  Negative for substance abuse. The patient is not nervous/anxious.   All other systems reviewed and are negative.   EKGs/Labs/Other Studies Reviewed:    Studies reviewed were summarized above. The additional studies were reviewed today:  Zio patch 10/2020: Patient had a min HR of 72 bpm, max HR of 174 bpm, and avg HR of 99 bpm. Predominant underlying rhythm was Sinus Rhythm.  1 run of Ventricular Tachycardia occurred lasting 5 beats with a max rate of 164 bpm (avg 135 bpm).   Occasional paroxysmal supraventricular Tachycardia runs occurred SVE Couplets  were rare (<1.0%), and SVE Triplets were rare (<1.0%).  Isolated VEs were rare (<1.0%), VE Couplets were rare (<1.0%), and no VE Triplets were present. __________  Elwyn Reach patch 04/2020: Patient had a min HR of 82 bpm, max HR of 210 bpm, and avg HR of 103 bpm. Predominant underlying rhythm was Sinus Rhythm. 3 Ventricular Tachycardia runs occurred, 2 Supraventricular Tachycardia runs occurred. Isolated SVEs were rare  (<1.0%), SVE Couplets were rare (<1.0%), and SVE Triplets were rare (<1.0%). Isolated VEs were rare (<1.0%, 9111), VE Triplets were rare (<  1.0%, 1), and no VE Couplets were present.  No significant arrhythmias to suggest etiology of syncope.  __________  2D echo 09/28/2019:  1. Left ventricular ejection fraction, by estimation, is 55 to 60%. The  left ventricle has normal function. The left ventricle has no regional  wall motion abnormalities. Left ventricular diastolic parameters were  normal. The average left ventricular  global longitudinal strain is -10.0 %.   2. Right ventricular systolic function is normal. The right ventricular  size is normal. Tricuspid regurgitation signal is inadequate for assessing  PA pressure.   3. The mitral valve is normal in structure. Trivial mitral valve  regurgitation. No evidence of mitral stenosis.   4. The aortic valve is grossly normal. Aortic valve regurgitation is not  visualized. No aortic stenosis is present.   5. The inferior vena cava is dilated in size with >50% respiratory  variability, suggesting right atrial pressure of 8 mmHg. __________  Carlton Adam MPI 09/01/2019: Low risk, probably normal pharmacologic myocardial perfusions stress test. There is a small in size, mild in severity, minimally reversible defect involving the mid anteroseptal and apical anterior segments most consistent with artifact but cannot rule out subtle scar and periinfarct ischemia. The left ventricular ejection fraction is normal to mildly decreased (59% by  Siemens calculation, 45% by QGS). Recommend echo correlation. There is no significant coronary artery calcification on the attenuation correction CT.    EKG:  EKG is ordered today.  The EKG ordered today demonstrates sinus tachycardia, 102 bpm, no acute ST-T changes  Recent Labs: 02/23/2021: Magnesium 2.1; TSH 1.370 03/08/2021: ALT 18; BUN 14; Creatinine, Ser 0.73; Hemoglobin 11.3; Platelets 431.0; Potassium 3.9; Pro B Natriuretic peptide (BNP) 34.0; Sodium 140  Recent Lipid Panel    Component Value Date/Time   CHOL 138 02/23/2021 1531   TRIG 126 02/23/2021 1531   HDL 41 02/23/2021 1531   CHOLHDL 3.4 02/23/2021 1531   CHOLHDL 2.6 01/21/2020 1505   VLDL 28.4 10/21/2019 1432   LDLCALC 74 02/23/2021 1531   LDLCALC 40 01/21/2020 1505    PHYSICAL EXAM:    VS:  BP 120/80 (BP Location: Left Arm, Patient Position: Sitting, Cuff Size: Normal)    Pulse (!) 102    Ht 5' 4"  (1.626 m)    Wt 189 lb (85.7 kg)    SpO2 99%    BMI 32.44 kg/m   BMI: Body mass index is 32.44 kg/m.  Physical Exam Vitals reviewed.  Constitutional:      Appearance: He is well-developed.  HENT:     Head: Normocephalic and atraumatic.  Eyes:     General:        Right eye: No discharge.        Left eye: No discharge.  Neck:     Vascular: No JVD.  Cardiovascular:     Rate and Rhythm: Regular rhythm. Tachycardia present.     Pulses:          Dorsalis pedis pulses are 2+ on the right side and 2+ on the left side.       Posterior tibial pulses are 2+ on the right side and 2+ on the left side.     Heart sounds: Normal heart sounds, S1 normal and S2 normal. Heart sounds not distant. No midsystolic click and no opening snap. No murmur heard.   No friction rub.  Pulmonary:     Effort: Pulmonary effort is normal. No respiratory distress.     Breath sounds: Normal breath sounds. No decreased  breath sounds, wheezing or rales.  Chest:     Chest wall: No tenderness.  Abdominal:     General: There is no distension.      Palpations: Abdomen is soft.     Tenderness: There is no abdominal tenderness.  Musculoskeletal:     Cervical back: Normal range of motion.     Right lower leg: No edema.     Left lower leg: No edema.  Skin:    General: Skin is warm and dry.     Nails: There is no clubbing.  Neurological:     Mental Status: He is alert and oriented to person, place, and time.  Psychiatric:        Speech: Speech normal.        Behavior: Behavior normal.        Thought Content: Thought content normal.        Judgment: Judgment normal.    Wt Readings from Last 3 Encounters:  04/04/21 189 lb (85.7 kg)  03/22/21 187 lb (84.8 kg)  03/21/21 190 lb 12.8 oz (86.5 kg)     ASSESSMENT & PLAN:   Syncope: Felt to be vasovagal in etiology.  No further episodes.  Cardiac work-up has been unrevealing thus far as outlined above.  Schedule carotid artery ultrasound.  Recommend adequate hydration.  Dizziness: Appears to be more neurologic in etiology given his symptoms are exacerbated with positional head movements and when closing eyes.  Prior MRI of the brain did demonstrate nonspecific findings.  Follow-up with PCP.  HLD: LDL 74 in 02/2021.  He remains on atorvastatin 40 mg.  Splenic artery infarct: Status post stenting by vascular surgery.  He remains on aspirin and clopidogrel under the direction of vascular surgery.  Follow-up as directed.   Disposition: F/u with Dr. Garen Lah or an APP in 6 months.   Medication Adjustments/Labs and Tests Ordered: Current medicines are reviewed at length with the patient today.  Concerns regarding medicines are outlined above. Medication changes, Labs and Tests ordered today are summarized above and listed in the Patient Instructions accessible in Encounters.   Signed, Christell Faith, PA-C 04/04/2021 4:39 PM     [address], [place] [address] [phone]

## 2021-04-01 ENCOUNTER — Other Ambulatory Visit: Payer: Self-pay | Admitting: Adult Health

## 2021-04-01 DIAGNOSIS — E538 Deficiency of other specified B group vitamins: Secondary | ICD-10-CM

## 2021-04-02 ENCOUNTER — Other Ambulatory Visit: Payer: Self-pay | Admitting: Adult Health

## 2021-04-02 DIAGNOSIS — E538 Deficiency of other specified B group vitamins: Secondary | ICD-10-CM

## 2021-04-04 ENCOUNTER — Ambulatory Visit (INDEPENDENT_AMBULATORY_CARE_PROVIDER_SITE_OTHER): Payer: Commercial Managed Care - HMO | Admitting: Physician Assistant

## 2021-04-04 ENCOUNTER — Other Ambulatory Visit: Payer: Self-pay

## 2021-04-04 ENCOUNTER — Encounter: Payer: Self-pay | Admitting: Oncology

## 2021-04-04 ENCOUNTER — Encounter: Payer: Self-pay | Admitting: Physician Assistant

## 2021-04-04 VITALS — BP 120/80 | HR 102 | Ht 64.0 in | Wt 189.0 lb

## 2021-04-04 DIAGNOSIS — R42 Dizziness and giddiness: Secondary | ICD-10-CM | POA: Diagnosis not present

## 2021-04-04 DIAGNOSIS — R55 Syncope and collapse: Secondary | ICD-10-CM | POA: Diagnosis not present

## 2021-04-04 DIAGNOSIS — E782 Mixed hyperlipidemia: Secondary | ICD-10-CM

## 2021-04-04 DIAGNOSIS — D735 Infarction of spleen: Secondary | ICD-10-CM

## 2021-04-04 DIAGNOSIS — E119 Type 2 diabetes mellitus without complications: Secondary | ICD-10-CM | POA: Diagnosis not present

## 2021-04-04 NOTE — Patient Instructions (Signed)
Medication Instructions:  No changes at this time.  *If you need a refill on your cardiac medications before your next appointment, please call your pharmacy*   Lab Work: None  If you have labs (blood work) drawn today and your tests are completely normal, you will receive your results only by: [place] (if you have MyChart) OR A paper copy in the mail If you have any lab test that is abnormal or we need to change your treatment, we will call you to review the results.   Testing/Procedures: Your physician has requested that you have a carotid duplex. This test is an ultrasound of the carotid arteries in your neck. It looks at blood flow through these arteries that supply the brain with blood. Allow one hour for this exam. There are no restrictions or special instructions.    Follow-Up: At [facility], you and your health needs are our priority.  As part of our continuing mission to provide you with exceptional heart care, we have created designated Provider Care Teams.  These Care Teams include your primary Cardiologist (physician) and Advanced Practice Providers (APPs -  Physician Assistants and Nurse Practitioners) who all work together to provide you with the care you need, when you need it.   Your next appointment:   4 month(s)  The format for your next appointment:   In Person  Provider:   [name], MD

## 2021-04-10 DIAGNOSIS — E1165 Type 2 diabetes mellitus with hyperglycemia: Secondary | ICD-10-CM | POA: Diagnosis not present

## 2021-04-12 ENCOUNTER — Telehealth: Payer: Self-pay | Admitting: Adult Health

## 2021-04-12 NOTE — Telephone Encounter (Signed)
° °  Record Review :   [facility] Radiology report- received from [facility] clinic neurology. Providers were unable to see Bilateral Carotid Duplex Scan in care everywhere.   Reviewed exam as above dated 10/26/2019  Right carotid: with no significant disease noted in the right carotid artery.Low resistance waveform of the left ICA.  Antergrade flow right ventral artery noted.   Left carotid no significant calcified disease of left carotid artery. Homogeneous plaque at the left carotid bifurcation without significant calcifications. Low resistance waveform of the left ICA.  Antergrade flow  left ventral artery noted.   Impression noted by radiologist: color duplex indicates minimal homogeneous plaque, with no hemodynamically significant stenosis by duplex criteria in the extracranial cerebral vascular circulation.  Read by Dulcy Fanny. Wagner DO, [place].  Reviewed by neurology Dr. Melrose Nakayama.   Sent to scan to chart.  Copy in media.

## 2021-04-19 ENCOUNTER — Encounter: Payer: Self-pay | Admitting: Oncology

## 2021-04-23 ENCOUNTER — Other Ambulatory Visit: Payer: Self-pay

## 2021-04-23 ENCOUNTER — Other Ambulatory Visit: Payer: Medicare Other

## 2021-04-23 DIAGNOSIS — R972 Elevated prostate specific antigen [PSA]: Secondary | ICD-10-CM

## 2021-04-24 ENCOUNTER — Telehealth: Payer: Self-pay | Admitting: Urology

## 2021-04-24 ENCOUNTER — Other Ambulatory Visit: Payer: Self-pay | Admitting: *Deleted

## 2021-04-24 ENCOUNTER — Encounter: Payer: Self-pay | Admitting: Oncology

## 2021-04-24 DIAGNOSIS — Z87891 Personal history of nicotine dependence: Secondary | ICD-10-CM

## 2021-04-24 LAB — PSA: Prostate Specific Ag, Serum: 4.1 ng/mL — ABNORMAL HIGH (ref 0.0–4.0)

## 2021-04-24 NOTE — Telephone Encounter (Signed)
Benjamin Conley needs to have clearance to come off his Plavix and aspirin before his prostate biopsy.  He says Dr. Derrel Nip prescribes those medications.

## 2021-04-26 ENCOUNTER — Ambulatory Visit (INDEPENDENT_AMBULATORY_CARE_PROVIDER_SITE_OTHER): Payer: Medicare Other | Admitting: Gastroenterology

## 2021-04-26 ENCOUNTER — Encounter: Payer: Self-pay | Admitting: Gastroenterology

## 2021-04-26 ENCOUNTER — Other Ambulatory Visit: Payer: Self-pay

## 2021-04-26 VITALS — BP 119/78 | HR 109 | Temp 98.0°F | Wt 188.0 lb

## 2021-04-26 DIAGNOSIS — D500 Iron deficiency anemia secondary to blood loss (chronic): Secondary | ICD-10-CM

## 2021-04-26 NOTE — Progress Notes (Signed)
Primary Care Physician: Doreen Beam, [name]  Primary Gastroenterologist:  Dr. [name]  Chief Complaint  Patient presents with   Follow-up    HPI: Benjamin Conley is a 56 y.o. male here after seeing me in the past for GI bleeding in the hospital.  The patient and EGD and colonoscopy with a couple of AVMs found in the colon.  The patient had recent blood work that showed him to have a decreased hemoglobin and hematocrit with a normal MCV.  The patient was recommended to come back and see me and also has been recommended to see hematology.  The patient denies any black stools or bloody stools.  He also denies any abdominal pain nausea vomiting fevers or chills.  Past Medical History:  Diagnosis Date   Acute posthemorrhagic anemia    Complication of anesthesia    c/o difficulty breathing after anesthesia   COPD (chronic obstructive pulmonary disease) (HCC)    Diabetes mellitus without complication ([place])    Hyperlipemia    Melena 08/21/2019   Paroxysmal supraventricular tachycardia (HCC) 08/19/2019   Postural dizziness with presyncope 08/21/2019   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   Tobacco abuse    Upper GI bleed 08/21/2019    Current Outpatient Medications  Medication Sig Dispense Refill   albuterol (VENTOLIN HFA) 108 (90 Base) MCG/ACT inhaler Inhale 2 puffs into the lungs every 6 (six) hours as needed for wheezing or shortness of breath. 6.7 g 3   aspirin 81 MG EC tablet Take 1 tablet (81 mg total) by mouth daily. 90 tablet 3   atorvastatin (LIPITOR) 40 MG tablet Take 1 tablet (40 mg total) by mouth daily. 90 tablet 3   blood glucose meter kit and supplies Dispense based on patient and insurance preference. Use up to four times daily as directed. (FOR ICD-10 E10.9, E11.9). 1 each 0   clopidogrel (PLAVIX) 75 MG tablet Take 1 tablet (75 mg total) by mouth daily. 90 tablet 3   insulin glargine (LANTUS) 100 UNIT/ML Solostar Pen Inject 9 Units into the skin at bedtime. 15 mL 0    insulin lispro (HUMALOG KWIKPEN) 100 UNIT/ML KwikPen Inject 4 Units into the skin 2 (two) times daily with a meal. Lunch and dinner 15 mL 11   Insulin Pen Needle 32G X 4 MM MISC Use as directed to administer insulin 100 each 2   metoprolol succinate (TOPROL-XL) 25 MG 24 hr tablet Take 0.5 tablets (12.5 mg total) by mouth daily. 90 tablet 1   nystatin-triamcinolone ointment (MYCOLOG) Apply 1 application topically 2 (two) times daily. 60 g 0   pantoprazole (PROTONIX) 20 MG tablet Take 1 tablet (20 mg total) by mouth daily. One hour before breakfast 90 tablet 0   Semaglutide, 2 MG/DOSE, 8 MG/3ML SOPN Inject 2 mg as directed once a week. 9 mL 1   sildenafil (VIAGRA) 100 MG tablet Take 1 tablet (100 mg total) by mouth daily as needed for erectile dysfunction. 30 tablet 0   tamsulosin (FLOMAX) 0.4 MG CAPS capsule Take 1 capsule (0.4 mg total) by mouth daily. 90 capsule 0   triamcinolone cream (KENALOG) 0.1 % Apply 1 application topically 2 (two) times daily. 30 g 0   No current facility-administered medications for this visit.    Allergies as of 04/26/2021 - Review Complete 04/26/2021  Allergen Reaction Noted   Wasp venom Anaphylaxis 11/02/2014   Wasp venom protein Anaphylaxis 11/25/2014   Coffee flavor Nausea And Vomiting 08/01/2019   Onion Nausea And Vomiting  05/25/2018    ROS:  General: Negative for anorexia, weight loss, fever, chills, fatigue, weakness. ENT: Negative for hoarseness, difficulty swallowing , nasal congestion. CV: Negative for chest pain, angina, palpitations, dyspnea on exertion, peripheral edema.  Respiratory: Negative for dyspnea at rest, dyspnea on exertion, cough, sputum, wheezing.  GI: See history of present illness. GU:  Negative for dysuria, hematuria, urinary incontinence, urinary frequency, nocturnal urination.  Endo: Negative for unusual weight change.    Physical Examination:   BP 119/78    Pulse (!) 109    Temp 98 F (36.7 C) (Oral)    Wt 188 lb (85.3 kg)     BMI 32.27 kg/m   General: Well-nourished, well-developed in no acute distress.  Eyes: No icterus. Conjunctivae pink. Neuro: Alert and oriented x 3.  Grossly intact. Skin: Warm and dry, no jaundice.   Psych: Alert and cooperative, normal mood and affect.  Labs:    Imaging Studies: No results found.  Assessment and Plan:   Benjamin Conley is a 56 y.o. y/o male who comes in today with a history of GI bleeding and now has anemia with a normal MCV.  The patient will have his blood sent off for iron studies in addition to having his blood sends off for B12 and folate. The patient has been told that if the blood work shows him to be iron deficient he may need a continued workup or a re-workup for blood loss anemia.  The patient has been explained the plan and agrees with it.     [name], MD. [name]    Note: This dictation was prepared with Dragon dictation along with smaller phrase technology. Any transcriptional errors that result from this process are unintentional.

## 2021-04-27 ENCOUNTER — Ambulatory Visit (INDEPENDENT_AMBULATORY_CARE_PROVIDER_SITE_OTHER): Payer: Medicare Other | Admitting: Pharmacist

## 2021-04-27 ENCOUNTER — Other Ambulatory Visit: Payer: Self-pay | Admitting: Urology

## 2021-04-27 DIAGNOSIS — E538 Deficiency of other specified B group vitamins: Secondary | ICD-10-CM

## 2021-04-27 DIAGNOSIS — E785 Hyperlipidemia, unspecified: Secondary | ICD-10-CM

## 2021-04-27 DIAGNOSIS — E119 Type 2 diabetes mellitus without complications: Secondary | ICD-10-CM

## 2021-04-27 DIAGNOSIS — I951 Orthostatic hypotension: Secondary | ICD-10-CM

## 2021-04-27 LAB — IRON: Iron: 41 ug/dL (ref 38–169)

## 2021-04-27 LAB — VITAMIN B12: Vitamin B-12: 336 pg/mL (ref 232–1245)

## 2021-04-27 LAB — FOLATE: Folate: 15.1 ng/mL (ref >3.0)

## 2021-04-27 MED ORDER — TAMSULOSIN HCL 0.4 MG PO CAPS: 0.4000 mg | ORAL_CAPSULE | Freq: Every day | ORAL | 0 refills | Status: DC

## 2021-04-27 NOTE — Chronic Care Management (AMB) (Signed)
Chronic Care Management CCM Pharmacy Note  04/27/2021 Name:  Vale Mousseau MRN:  [id number] DOB:  Jul 14, 1965  Summary: - Glucose improved but still elevated - LDL not at goal - Requests refill on tamsulosin. Will send to urology  Recommendations/Changes made from today's visit: - Recommend to increase Lantus to 12 units daily - Recommend to increase atorvastatin to 80 mg daily   Subjective: Phoenyx Dade Rodin is an 56 y.o. year old male who is a primary patient of Flinchum, Kelby Aline, [name].  The CCM team was consulted for assistance with disease management and care coordination needs.    Engaged with patient by telephone for follow up visit for pharmacy case management and/or care coordination services.   Objective:  Medications Reviewed Today     Reviewed by [name], RPH-CPP (Pharmacist) on 04/27/21 at 1033  Med List Status: <None>   Medication Order Taking? Sig Documenting Provider Last Dose Status Informant  albuterol (VENTOLIN HFA) 108 (90 Base) MCG/ACT inhaler [id number]  Inhale 2 puffs into the lungs every 6 (six) hours as needed for wheezing or shortness of breath. Crecencio Mc, MD  Active   aspirin 81 MG EC tablet [id number] Yes Take 1 tablet (81 mg total) by mouth daily. Crecencio Mc, MD Taking Active   atorvastatin (LIPITOR) 40 MG tablet [id number] Yes Take 1 tablet (40 mg total) by mouth daily. Crecencio Mc, MD Taking Active   blood glucose meter kit and supplies [id number] Yes Dispense based on patient and insurance preference. Use up to four times daily as directed. (FOR ICD-10 E10.9, E11.9). [name], NP Taking Active   Cholecalciferol (VITAMIN D) 125 MCG (5000 UT) CAPS [id number] Yes Take 1 capsule by mouth daily. [provider] Taking Active   clopidogrel (PLAVIX) 75 MG tablet [id number] Yes Take 1 tablet (75 mg total) by mouth daily. Crecencio Mc, MD Taking Active   insulin glargine (LANTUS) 100 UNIT/ML Solostar Pen  [id number] Yes Inject 9 Units into the skin at bedtime. Flinchum, Kelby Aline, [name] Taking Active   insulin lispro (HUMALOG KWIKPEN) 100 UNIT/ML KwikPen [id number] Yes Inject 4 Units into the skin 2 (two) times daily with a meal. Lunch and dinner Burnard Hawthorne, [name] Taking Active   Insulin Pen Needle 32G X 4 MM MISC [id number] Yes Use as directed to administer insulin Burnard Hawthorne, [name] Taking Active   metoprolol succinate (TOPROL-XL) 25 MG 24 hr tablet [id number] Yes Take 0.5 tablets (12.5 mg total) by mouth daily. Burnard Hawthorne, [name] Taking Active   nystatin-triamcinolone ointment [facility]) [id number] Yes Apply 1 application topically 2 (two) times daily. Flinchum, Kelby Aline, [name] Taking Active   pantoprazole (PROTONIX) 20 MG tablet [id number] Yes Take 1 tablet (20 mg total) by mouth daily. One hour before breakfast Burnard Hawthorne, [name] Taking Active   Semaglutide, 2 MG/DOSE, 8 MG/3ML SOPN [id number] Yes Inject 2 mg as directed once a week. Burnard Hawthorne, [name] Taking Active   sildenafil (VIAGRA) 100 MG tablet [id number] Yes Take 1 tablet (100 mg total) by mouth daily as needed for erectile dysfunction. [name], PA-C Taking Active   tamsulosin (FLOMAX) 0.4 MG CAPS capsule [id number] No Take 1 capsule (0.4 mg total) by mouth daily.  Patient not taking: Reported on 04/27/2021   Doreen Beam, [name] Not Taking Active   triamcinolone cream (KENALOG) 0.1 % [id number] Yes Apply 1 application topically 2 (two) times daily. Flinchum, Kelby Aline, [name] Taking Active  Pertinent Labs:   Lab Results  Component Value Date   HGBA1C 7.9 (H) 02/23/2021   Lab Results  Component Value Date   CHOL 138 02/23/2021   HDL 41 02/23/2021   LDLCALC 74 02/23/2021   TRIG 126 02/23/2021   CHOLHDL 3.4 02/23/2021   Lab Results  Component Value Date   CREATININE 0.73 03/08/2021   BUN 14 03/08/2021   NA 140 03/08/2021   K 3.9 03/08/2021   CL 105 03/08/2021   CO2 28 03/08/2021     SDOH:  (Social Determinants of Health) assessments and interventions performed:  SDOH Interventions    Flowsheet Row Most Recent Value  SDOH Interventions   Financial Strain Interventions Intervention Not Indicated       CCM Care Plan  Review of patient past medical history, allergies, medications, health status, including review of consultants reports, laboratory and other test data, was performed as part of comprehensive evaluation and provision of chronic care management services.   Care Plan : Medication Management  Updates made by [name], RPH-CPP since 04/27/2021 12:00 AM     Problem: Diabetes, Tobacco Abuse, Splenic Infarct, Tachycardia      Long-Range Goal: Disease Progression Prevention   Recent Progress: On track  Priority: High  Note:   Current Barriers:  Unable to independently monitor therapeutic efficacy Unable to achieve control of diabetes  Suboptimal cardiovascular risk reduction  Pharmacist Clinical Goal(s):  Over the next 90 days, patient will achieve adherence to monitoring guidelines and medication adherence to achieve therapeutic efficacy. Over the next 90 days, patient will achieve control of diabetes as evidenced by improvement in A1c through collaboration with PharmD and provider.   Interventions: 1:1 collaboration with covering provider, Deborra Medina, MD regarding development and update of comprehensive plan of care as evidenced by provider attestation and co-signature Inter-disciplinary care team collaboration (see longitudinal plan of care) Comprehensive medication review performed; medication list updated in electronic medical record  Health Maintenance   Yearly diabetic eye exam: up to date Yearly diabetic foot exam: up to date Urine microalbumin: up to date Yearly influenza vaccination: due - patient declined  Td/Tdap vaccination: up to date Pneumonia vaccination: up to date COVID vaccinations: due - patient previously  declined Shingrix vaccinations: due - patient previously declined Colonoscopy: up to date  Diabetes: Uncontrolled; current treatment: Ozempic 2 mg weekly, Lantus 9 units, Novolog 4 units with meals - though reports he is taking after a meal Hx metformin - could not tolerate d/t diarrhea, even XR formulation. Current glucose readings: using Libre 2  Date of Download: last 7 days Average Glucose: 172 mg/dL - midnight - 6 am: 153 - 6 am - noon: 177 - noon - 6 pm: 189 - 6 pm- midnight: 175 Time in Goal:  - Time in range 70-180: 71% - Time above range: 29% - Time below range: 0% - Current patterns: elevated fastings, with post prandial elevations throughout the day Given fasting elevations, recommend to increase Lantus to 12 units daily. As patient has not been appropriately taking Novolog before a meal, will have him move administration prior to adjusting dose. Will discuss with PCP    Hypertension: Controlled per home readings; current regimen: metoprolol succinate 12.5 mg daily  Work up for lightheadedness/dizziness negative from a cardiac perspective Previously recommended to continue current regimen at this time.   Hyperlipidemia, hx splenic infarct Uncontrolled; current treatment: atorvastatin 40 mg daily Antiplatelet regimen: aspirin 81 mg daily; clopidogrel 75 mg daily  Recommend to increase  atorvastatin to 80 mg daily, recommend goal LDL <70 given DM. Will discuss with PCP.   BPH: Controlled per patient report; current regimen: tamsulosin 0.4 mg daily; follows w/ urology [place] Requests refill on tamsulosin. Will send message to Urology to order.  Previously recommended to continue current regimen at this time  GERD: Moderately well controlled; current regimen: pantoprazole 20 mg daily Previously recommended to continue current regimen at this time   Patient Goals/Self-Care Activities Over the next 90 days, patient will:  - take medications as prescribed check blood  glucose daily, document, and provide at future appointments check blood pressure daily, document, and provide at future appointments      Plan: Telephone follow up appointment with care management team member scheduled for:  10 weeks  Catie Darnelle Maffucci, PharmD, [place], [place] Clinical Pharmacist [employer] at [employer] [phone]

## 2021-04-27 NOTE — Patient Instructions (Signed)
[place],   Keep up the great work!  I recommend we increase Lantus to 12 units daily to improve your blood sugars.   I recommend we increase atorvastatin to 80 mg daily to target an LDL (bad cholesterol) less than 70.   I'll ask Urology to refill your tamsulosin.   Take care!  Catie Darnelle Maffucci, PharmD  Visit Information  Following are the goals we discussed today:  Patient Goals/Self-Care Activities Over the next 90 days, patient will:  - take medications as prescribed check blood glucose daily, document, and provide at future appointments check blood pressure daily, document, and provide at future appointments        Plan: Telephone follow up appointment with care management team member scheduled for:  10 weeks   Catie Darnelle Maffucci, PharmD, [place], CPP Clinical Pharmacist [place] at [facility] [phone]     Please call the care guide team at [phone] if you need to cancel or reschedule your appointment.   Patient verbalizes understanding of instructions and care plan provided today and agrees to view in [place]. Active MyChart status confirmed with patient.

## 2021-04-28 ENCOUNTER — Encounter: Payer: Self-pay | Admitting: Cardiology

## 2021-05-01 ENCOUNTER — Ambulatory Visit (INDEPENDENT_AMBULATORY_CARE_PROVIDER_SITE_OTHER): Payer: Medicare Other | Admitting: Cardiology

## 2021-05-01 ENCOUNTER — Encounter: Payer: Self-pay | Admitting: Cardiology

## 2021-05-01 ENCOUNTER — Ambulatory Visit (INDEPENDENT_AMBULATORY_CARE_PROVIDER_SITE_OTHER): Payer: Medicare Other | Admitting: Acute Care

## 2021-05-01 ENCOUNTER — Encounter: Payer: Self-pay | Admitting: Oncology

## 2021-05-01 ENCOUNTER — Other Ambulatory Visit: Payer: Self-pay

## 2021-05-01 ENCOUNTER — Encounter: Payer: Self-pay | Admitting: Acute Care

## 2021-05-01 VITALS — BP 113/76 | HR 99 | Ht 64.0 in | Wt 190.0 lb

## 2021-05-01 DIAGNOSIS — Z87891 Personal history of nicotine dependence: Secondary | ICD-10-CM | POA: Diagnosis not present

## 2021-05-01 DIAGNOSIS — E782 Mixed hyperlipidemia: Secondary | ICD-10-CM | POA: Diagnosis not present

## 2021-05-01 DIAGNOSIS — I951 Orthostatic hypotension: Secondary | ICD-10-CM | POA: Diagnosis not present

## 2021-05-01 DIAGNOSIS — I728 Aneurysm of other specified arteries: Secondary | ICD-10-CM

## 2021-05-01 DIAGNOSIS — R000 Tachycardia, unspecified: Secondary | ICD-10-CM

## 2021-05-01 MED ORDER — DIGOXIN 125 MCG PO TABS: 0.1250 mg | ORAL_TABLET | Freq: Every day | ORAL | 3 refills | Status: DC

## 2021-05-01 NOTE — Patient Instructions (Signed)
Thank you for participating in the [place] Lung Cancer Screening Program. °It was our pleasure to meet you today. °We will call you with the results of your scan within the next few days. °Your scan will be assigned a Lung RADS category score by the physicians reading the scans.  °This Lung RADS score determines follow up scanning.  °See below for description of categories, and follow up screening recommendations. °We will be in touch to schedule your follow up screening annually or based on recommendations of our providers. °We will fax a copy of your scan results to your Primary Care Physician, or the physician who referred you to the program, to ensure they have the results. °Please call the office if you have any questions or concerns regarding your scanning experience or results.  °Our office number is [phone]. °Please speak with Denise Phelps, RN. She is our Lung Cancer Screening RN. °If she is unavailable when you call, please have the office staff send her a message. She will return your call at her earliest convenience. °Remember, if your scan is normal, we will scan you annually as long as you continue to meet the criteria for the program. (Age 55-77, Current smoker or smoker who has quit within the last 15 years). °If you are a smoker, remember, quitting is the single most powerful action that you can take to decrease your risk of lung cancer and other pulmonary, breathing related problems. °We know quitting is hard, and we are here to help.  °Please let us know if there is anything we can do to help you meet your goal of quitting. °If you are a former smoker, congratulations. We are proud of you! Remain smoke free! °Remember you can refer friends or family members through the number above.  °We will screen them to make sure they meet criteria for the program. °Thank you for helping us take better care of you by participating in Lung Screening. ° °You can receive free nicotine replacement therapy  ( patches, gum or mints) by calling 1-800-QUIT NOW. Please call so we can get you on the path to becoming  a non-smoker. I know it is hard, but you can do this! ° °Lung RADS Categories: ° °Lung RADS 1: no nodules or definitely non-concerning nodules.  °Recommendation is for a repeat annual scan in 12 months. ° °Lung RADS 2:  nodules that are non-concerning in appearance and behavior with a very low likelihood of becoming an active cancer. °Recommendation is for a repeat annual scan in 12 months. ° °Lung RADS 3: nodules that are probably non-concerning , includes nodules with a low likelihood of becoming an active cancer.  Recommendation is for a 6-month repeat screening scan. Often noted after an upper respiratory illness. We will be in touch to make sure you have no questions, and to schedule your 6-month scan. ° °Lung RADS 4 A: nodules with concerning findings, recommendation is most often for a follow up scan in 3 months or additional testing based on our provider's assessment of the scan. We will be in touch to make sure you have no questions and to schedule the recommended 3 month follow up scan. ° °Lung RADS 4 B:  indicates findings that are concerning. We will be in touch with you to schedule additional diagnostic testing based on our provider's  assessment of the scan. ° °Hypnosis for smoking cessation  °[employer] °[phone] ° °Acupuncture for smoking cessation  °[facility] °[phone]  °

## 2021-05-01 NOTE — Patient Instructions (Signed)
Medication Instructions:   Your physician has recommended you make the following change in your medication:    START taking Digoxin 0.125 MG once a day.  *If you need a refill on your cardiac medications before your next appointment, please call your pharmacy*   Lab Work: None ordered If you have labs (blood work) drawn today and your tests are completely normal, you will receive your results only by: [place] (if you have MyChart) OR A paper copy in the mail If you have any lab test that is abnormal or we need to change your treatment, we will call you to review the results.   Testing/Procedures: None ordered   Follow-Up: At [facility], you and your health needs are our priority.  As part of our continuing mission to provide you with exceptional heart care, we have created designated Provider Care Teams.  These Care Teams include your primary Cardiologist (physician) and Advanced Practice Providers (APPs -  Physician Assistants and Nurse Practitioners) who all work together to provide you with the care you need, when you need it.  We recommend signing up for the patient portal called "MyChart".  Sign up information is provided on this After Visit Summary.  MyChart is used to connect with patients for Virtual Visits (Telemedicine).  Patients are able to view lab/test results, encounter notes, upcoming appointments, etc.  Non-urgent messages can be sent to your provider as well.   To learn more about what you can do with MyChart, go to [email or web address].    Your next appointment:   2 month(s)  The format for your next appointment:   In Person  Provider:   You may see [name], MD or one of the following Advanced Practice Providers on your designated Care Team:   Murray Hodgkins, NP Christell Faith, PA-C Cadence Kathlen Mody, [place]    Other Instructions

## 2021-05-01 NOTE — Progress Notes (Signed)
Cardiology Office Note:    Date:  05/04/2021   ID:  Benjamin Conley, DOB 1965-12-17, MRN [id number]  PCP:  Doreen Beam, [name]  [facility] Cardiologist:  [name], MD  [place] Electrophysiologist:  None   Referring MD: Sharmon Leyden*   Chief Complaint  Patient presents with   Other    Orthostatic changes/Low BP. Meds reviewed verbally with pt.    History of Present Illness:    Benjamin Conley is a 56 y.o. male with a hx of diabetes, splenic infarct s/p stent placement, COPD, hyperlipidemia, former smoker x40+ years who presents due to low blood pressures.    Patient states having low blood pressures about 3 days ago.  Systolics were in the 37S on sitting, reduced to 60s upon standing.  Patient states feeling weak and dizzy.  Was taking Toprol-XL 12.5 mg daily for inappropriate sinus tachycardia.  He stopped taking metoprolol with improvement in blood pressure, symptoms are also better although patient is still has slight dizziness.  He also takes Flomax for BPH.  He was previously seen for syncopal episodes deemed noncardiac in etiology.  Work-up with echo, cardiac monitor, Myoview were unrevealing.  Diagnosed with vertigo by ENT.  Carotid ultrasound previously ordered to evaluate carotid stenosis.  Prior notes Echo 10/2705 normal systolic and diastolic function, EF 55 to 60%. Lexiscan Myoview 08/2019 low risk, no evidence for ischemia, no significant coronary artery calcification Cardiac monitor on May/2022, no significant arrhythmias.  Patient had worsening left upper quadrant abdominal pain.  He presented to the ED  where CT abdomen dated 07/2019 showed distal splenic aneurysm no greater than 6 mm, small dissection, likely source of splenic infarctions.  He was diagnosed with splenic infarct and had a stent placed to the splenic artery.    Denies palpitations or irregular heartbeats.  States his heart rates have been elevated over the past 2 to  3 weeks, up to 120 bpm upon checks at home.   Past Medical History:  Diagnosis Date   Acute posthemorrhagic anemia    Complication of anesthesia    c/o difficulty breathing after anesthesia   COPD (chronic obstructive pulmonary disease) (HCC)    Diabetes mellitus without complication ([place])    Hyperlipemia    Melena 08/21/2019   Paroxysmal supraventricular tachycardia (HCC) 08/19/2019   Postural dizziness with presyncope 08/21/2019   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   Tobacco abuse    Upper GI bleed 08/21/2019    Past Surgical History:  Procedure Laterality Date   BACK SURGERY     lumbar   COLONOSCOPY WITH PROPOFOL N/A 08/24/2019   Procedure: COLONOSCOPY WITH PROPOFOL;  Surgeon: [name], MD;  Location: [facility] ENDOSCOPY;  Service: Endoscopy;  Laterality: N/A;   ESOPHAGOGASTRODUODENOSCOPY (EGD) WITH PROPOFOL N/A 08/24/2019   Procedure: ESOPHAGOGASTRODUODENOSCOPY (EGD) WITH PROPOFOL;  Surgeon: [name], MD;  Location: [facility] ENDOSCOPY;  Service: Endoscopy;  Laterality: N/A;   STENT PLACEMENT VASCULAR ([place] [facility])  07/2019   stenosis of distal splenic artery and stent placed   VISCERAL ANGIOGRAPHY N/A 08/06/2019   Procedure: VISCERAL ANGIOGRAPHY;  Surgeon: [name], MD;  Location: [facility] CV LAB;  Service: Cardiovascular;  Laterality: N/A;    Current Medications: Current Meds  Medication Sig   albuterol (VENTOLIN HFA) 108 (90 Base) MCG/ACT inhaler Inhale 2 puffs into the lungs every 6 (six) hours as needed for wheezing or shortness of breath.   aspirin 81 MG EC tablet Take 1 tablet (81 mg total) by mouth  daily.   atorvastatin (LIPITOR) 40 MG tablet Take 1 tablet (40 mg total) by mouth daily.   blood glucose meter kit and supplies Dispense based on patient and insurance preference. Use up to four times daily as directed. (FOR ICD-10 E10.9, E11.9).   Cholecalciferol (VITAMIN D) 125 MCG (5000 UT) CAPS Take 1 capsule by mouth daily.   clopidogrel (PLAVIX) 75 MG tablet Take 1  tablet (75 mg total) by mouth daily.   digoxin (LANOXIN) 0.125 MG tablet Take 1 tablet (0.125 mg total) by mouth daily.   insulin glargine (LANTUS) 100 UNIT/ML Solostar Pen Inject 9 Units into the skin at bedtime.   insulin lispro (HUMALOG KWIKPEN) 100 UNIT/ML KwikPen Inject 4 Units into the skin 2 (two) times daily with a meal. Lunch and dinner   Insulin Pen Needle 32G X 4 MM MISC Use as directed to administer insulin   metoprolol succinate (TOPROL-XL) 25 MG 24 hr tablet Take 0.5 tablets (12.5 mg total) by mouth daily.   nystatin-triamcinolone ointment (MYCOLOG) Apply 1 application topically 2 (two) times daily.   pantoprazole (PROTONIX) 20 MG tablet Take 1 tablet (20 mg total) by mouth daily. One hour before breakfast   Semaglutide, 2 MG/DOSE, 8 MG/3ML SOPN Inject 2 mg as directed once a week.   sildenafil (VIAGRA) 100 MG tablet Take 1 tablet (100 mg total) by mouth daily as needed for erectile dysfunction.   tamsulosin (FLOMAX) 0.4 MG CAPS capsule Take 1 capsule (0.4 mg total) by mouth daily.   triamcinolone cream (KENALOG) 0.1 % Apply 1 application topically 2 (two) times daily.     Allergies:   Wasp venom, Wasp venom protein, Coffee flavor, and Onion   Social History   Socioeconomic History   Marital status: Single    Spouse name: Not on file   Number of children: Not on file   Years of education: Not on file   Highest education level: Not on file  Occupational History   Not on file  Tobacco Use   Smoking status: Former    Packs/day: 1.00    Years: 48.00    Pack years: 48.00    Types: Cigarettes    Quit date: 02/12/2020    Years since quitting: 1.2   Smokeless tobacco: Never  Vaping Use   Vaping Use: Never used  Substance and Sexual Activity   Alcohol use: No    Comment: Never been a problem   Drug use: No   Sexual activity: Yes  Other Topics Concern   Not on file  Social History Narrative   Single, lives alone, unemployed.   Social Determinants of Health    Financial Resource Strain: Low Risk    Difficulty of Paying Living Expenses: Not hard at all  Food Insecurity: No Food Insecurity   Worried About Charity fundraiser in the Last Year: Never true   [place] in the Last Year: Never true  Transportation Needs: No Transportation Needs   Lack of Transportation (Medical): No   Lack of Transportation (Non-Medical): No  Physical Activity: Insufficiently Active   Days of Exercise per Week: 4 days   Minutes of Exercise per Session: 20 min  Stress: No Stress Concern Present   Feeling of Stress : Not at all  Social Connections: Unknown   Frequency of Communication with Friends and Family: Not on file   Frequency of Social Gatherings with Friends and Family: More than three times a week   Attends Religious Services: Not on file  Active Member of Clubs or Organizations: No   Attends Archivist Meetings: Not on file   Marital Status: Not on file     Family History: The patient's family history includes Cancer in his maternal aunt; Colon cancer in his maternal uncle; Diabetes in his brother, mother, sister, sister, sister, and sister; Diabetes Mellitus II in his brother and mother; Heart disease in his father; Hyperlipidemia in his brother; Tuberculosis in his father. There is no history of Prostate cancer.  ROS:   Please see the history of present illness.     All other systems reviewed and are negative.  EKGs/Labs/Other Studies Reviewed:    The following studies were reviewed today:   EKG:  EKG is ordered today.  Sinus rhythm, heart rate 99  Recent Labs: 02/23/2021: Magnesium 2.1; TSH 1.370 03/08/2021: ALT 18; BUN 14; Creatinine, Ser 0.73; Hemoglobin 11.3; Platelets 431.0; Potassium 3.9; Pro B Natriuretic peptide (BNP) 34.0; Sodium 140  Recent Lipid Panel    Component Value Date/Time   CHOL 138 02/23/2021 1531   TRIG 126 02/23/2021 1531   HDL 41 02/23/2021 1531   CHOLHDL 3.4 02/23/2021 1531   CHOLHDL 2.6  01/21/2020 1505   VLDL 28.4 10/21/2019 1432   LDLCALC 74 02/23/2021 1531   LDLCALC 40 01/21/2020 1505    Physical Exam:    VS:  BP 113/76 (BP Location: Right Arm, Patient Position: Sitting, Cuff Size: Normal)    Pulse 99    Ht _0  (1.626 m)    Wt 190 lb (86.2 kg)    SpO2 99%    BMI 32.61 kg/m     Wt Readings from Last 3 Encounters:  05/01/21 190 lb (86.2 kg)  04/26/21 188 lb (85.3 kg)  04/04/21 189 lb (85.7 kg)     GEN:  Well nourished, well developed in no acute distress HEENT: Normal NECK: No JVD; No carotid bruits CARDIAC: Tachycardic, regular, no murmurs, rubs, gallops RESPIRATORY:  Clear to auscultation without rales, wheezing or rhonchi  ABDOMEN: Soft, non-tender, non-distended MUSCULOSKELETAL:  No edema; No deformity  SKIN: Warm and dry NEUROLOGIC:  Alert and oriented x 3 PSYCHIATRIC:  Normal affect   ASSESSMENT:    1. Orthostatic hypotension   2. Inappropriate sinus tachycardia   3. Mixed hyperlipidemia   4. Splenic artery aneurysm (HCC)      PLAN:    In order of problems listed above:  Orthostatic hypotension, symptoms improved with stopping Toprol-XL.  Flomax also likely contributing to symptoms.  Discussed with urology regarding alternative options if symptoms persist or get worse. Inappropriate sinus tachycardia, start digoxin 0.125 daily.  Could not tolerate Toprol-XL due to orthostasis as above. Hyperlipidemia, cholesterol controlled, continue Lipitor Splenic artery aneurysm, dissection s/p stent placement.  On aspirin, Plavix, Lipitor.  Keep follow-up appointments with vascular surgery.  Follow-up 2 months.  Medication Adjustments/Labs and Tests Ordered: Current medicines are reviewed at length with the patient today.  Concerns regarding medicines are outlined above.  Orders Placed This Encounter  Procedures   EKG 12-Lead    Meds ordered this encounter  Medications   digoxin (LANOXIN) 0.125 MG tablet    Sig: Take 1 tablet (0.125 mg total) by  mouth daily.    Dispense:  30 tablet    Refill:  3     Patient Instructions  Medication Instructions:   Your physician has recommended you make the following change in your medication:    START taking Digoxin 0.125 MG once a day.  *If you need  a refill on your cardiac medications before your next appointment, please call your pharmacy*   Lab Work: None ordered If you have labs (blood work) drawn today and your tests are completely normal, you will receive your results only by: [place] (if you have MyChart) OR A paper copy in the mail If you have any lab test that is abnormal or we need to change your treatment, we will call you to review the results.   Testing/Procedures: None ordered   Follow-Up: At [facility], you and your health needs are our priority.  As part of our continuing mission to provide you with exceptional heart care, we have created designated Provider Care Teams.  These Care Teams include your primary Cardiologist (physician) and Advanced Practice Providers (APPs -  Physician Assistants and Nurse Practitioners) who all work together to provide you with the care you need, when you need it.  We recommend signing up for the patient portal called "MyChart".  Sign up information is provided on this After Visit Summary.  MyChart is used to connect with patients for Virtual Visits (Telemedicine).  Patients are able to view lab/test results, encounter notes, upcoming appointments, etc.  Non-urgent messages can be sent to your provider as well.   To learn more about what you can do with MyChart, go to [email or web address].    Your next appointment:   2 month(s)  The format for your next appointment:   In Person  Provider:   You may see [name], MD or one of the following Advanced Practice Providers on your designated Care Team:   Murray Hodgkins, NP Christell Faith, PA-C Cadence Kathlen Mody, [place]    Other Instructions     Signed, [name], MD  05/04/2021 7:49 AM    [place]

## 2021-05-01 NOTE — Progress Notes (Signed)
Virtual Visit via Telephone Note  I connected with Benjamin Conley on 05/01/21 at 10:30 AM EST by telephone and verified that I am speaking with the correct person using two identifiers.  Location: Patient: At home Provider: [place], [place], [place], [address]    I discussed the limitations, risks, security and privacy concerns of performing an evaluation and management service by telephone and the availability of in person appointments. I also discussed with the patient that there may be a patient responsible charge related to this service. The patient expressed understanding and agreed to proceed.    Shared Decision Making Visit Lung Cancer Screening Program [phone])   Eligibility: Age 56 y.o. Pack Years Smoking History Calculation 100 pack year smoking history (# packs/per year x # years smoked) Recent History of coughing up blood  no Unexplained weight loss? no ( >Than 15 pounds within the last 6 months ) Prior History Lung / other cancer no (Diagnosis within the last 5 years already requiring surveillance chest CT Scans). Smoking Status Former Smoker Former Smokers: Years since quit: 1 year  Quit Date: 03/2020  Visit Components: Discussion included one or more decision making aids. yes Discussion included risk/benefits of screening. yes Discussion included potential follow up diagnostic testing for abnormal scans. yes Discussion included meaning and risk of over diagnosis. yes Discussion included meaning and risk of False Positives. yes Discussion included meaning of total radiation exposure. yes  Counseling Included: Importance of adherence to annual lung cancer LDCT screening. yes Impact of comorbidities on ability to participate in the program. yes Ability and willingness to under diagnostic treatment. yes  Smoking Cessation Counseling: Current Smokers:  Discussed importance of smoking cessation. yes Information about tobacco cessation classes and  interventions provided to patient. yes Patient provided with "ticket" for LDCT Scan. yes Symptomatic Patient. no  Counseling NA Diagnosis Code: Tobacco Use Z72.0 Asymptomatic Patient yes  Counseling (Intermediate counseling: > three minutes counseling) B2841 Former Smokers:  Discussed the importance of maintaining cigarette abstinence. yes Diagnosis Code: Personal History of Nicotine Dependence. L24.401 Information about tobacco cessation classes and interventions provided to patient. Yes Patient provided with "ticket" for LDCT Scan. yes Written Order for Lung Cancer Screening with LDCT placed in Epic. Yes (CT Chest Lung Cancer Screening Low Dose W/O CM) [id number] Z12.2-Screening of respiratory organs Z87.891-Personal history of nicotine dependence  I spent 25 minutes of face to face time/virtual visit time  with  Benjamin Conley discussing the risks and benefits of lung cancer screening. We took the time to pause the power point at intervals to allow for questions to be asked and answered to ensure understanding. We discussed that he had taken the single most powerful action possible to decrease his risk of developing lung cancer when he quit smoking. I counseled him to remain smoke free, and to contact me if he ever had the desire to smoke again so that I can provide resources and tools to help support the effort to remain smoke free. We discussed the time and location of the scan, and that either  Doroteo Glassman RN, Joella Prince, RN or I  or I will call / send a letter with the results within  24-72 hours of receiving them. He has the office contact information in the event he needs to speak with me,  he verbalized understanding of all of the above and had no further questions upon leaving the office.     I explained to the patient that there has been  a high incidence of coronary artery disease noted on these exams. I explained that this is a non-gated exam therefore degree or severity cannot be  determined. This patient is on statin therapy. I have asked the patient to follow-up with their PCP regarding any incidental finding of coronary artery disease and management with diet or medication as they feel is clinically indicated. The patient verbalized understanding of the above and had no further questions.     [name], NP 05/01/2021

## 2021-05-02 ENCOUNTER — Encounter: Payer: Self-pay | Admitting: Oncology

## 2021-05-02 ENCOUNTER — Ambulatory Visit
Admission: RE | Admit: 2021-05-02 | Discharge: 2021-05-02 | Disposition: A | Discharge status: Home / Self Care | Payer: Medicare Other | Source: Ambulatory Visit | Attending: Acute Care | Admitting: Acute Care

## 2021-05-02 DIAGNOSIS — Z87891 Personal history of nicotine dependence: Secondary | ICD-10-CM | POA: Diagnosis not present

## 2021-05-02 DIAGNOSIS — I2510 Atherosclerotic heart disease of native coronary artery without angina pectoris: Secondary | ICD-10-CM | POA: Diagnosis not present

## 2021-05-02 DIAGNOSIS — J439 Emphysema, unspecified: Secondary | ICD-10-CM | POA: Diagnosis not present

## 2021-05-02 DIAGNOSIS — R918 Other nonspecific abnormal finding of lung field: Secondary | ICD-10-CM | POA: Diagnosis not present

## 2021-05-04 ENCOUNTER — Other Ambulatory Visit: Payer: Self-pay

## 2021-05-04 DIAGNOSIS — N3943 Post-void dribbling: Secondary | ICD-10-CM

## 2021-05-04 MED ORDER — SILODOSIN 8 MG PO CAPS: 8.0000 mg | ORAL_CAPSULE | Freq: Every day | ORAL | 0 refills | Status: AC

## 2021-05-07 ENCOUNTER — Inpatient Hospital Stay (HOSPITAL_BASED_OUTPATIENT_CLINIC_OR_DEPARTMENT_OTHER): Payer: Medicare Other | Admitting: Internal Medicine

## 2021-05-07 ENCOUNTER — Inpatient Hospital Stay: Payer: Medicare Other

## 2021-05-07 ENCOUNTER — Other Ambulatory Visit: Payer: Self-pay | Admitting: Adult Health

## 2021-05-07 ENCOUNTER — Encounter: Payer: Self-pay | Admitting: Internal Medicine

## 2021-05-07 ENCOUNTER — Other Ambulatory Visit: Payer: Self-pay

## 2021-05-07 ENCOUNTER — Inpatient Hospital Stay: Payer: Medicare Other | Attending: Internal Medicine

## 2021-05-07 ENCOUNTER — Telehealth: Payer: Self-pay

## 2021-05-07 VITALS — BP 132/89 | HR 91

## 2021-05-07 DIAGNOSIS — Z833 Family history of diabetes mellitus: Secondary | ICD-10-CM | POA: Diagnosis not present

## 2021-05-07 DIAGNOSIS — Z79899 Other long term (current) drug therapy: Secondary | ICD-10-CM | POA: Diagnosis not present

## 2021-05-07 DIAGNOSIS — D75839 Thrombocytosis, unspecified: Secondary | ICD-10-CM | POA: Diagnosis not present

## 2021-05-07 DIAGNOSIS — J439 Emphysema, unspecified: Secondary | ICD-10-CM | POA: Diagnosis not present

## 2021-05-07 DIAGNOSIS — D500 Iron deficiency anemia secondary to blood loss (chronic): Secondary | ICD-10-CM

## 2021-05-07 DIAGNOSIS — Z720 Tobacco use: Secondary | ICD-10-CM

## 2021-05-07 DIAGNOSIS — M549 Dorsalgia, unspecified: Secondary | ICD-10-CM | POA: Insufficient documentation

## 2021-05-07 DIAGNOSIS — M2550 Pain in unspecified joint: Secondary | ICD-10-CM | POA: Insufficient documentation

## 2021-05-07 DIAGNOSIS — E538 Deficiency of other specified B group vitamins: Secondary | ICD-10-CM | POA: Insufficient documentation

## 2021-05-07 DIAGNOSIS — Z8349 Family history of other endocrine, nutritional and metabolic diseases: Secondary | ICD-10-CM | POA: Insufficient documentation

## 2021-05-07 DIAGNOSIS — Z87891 Personal history of nicotine dependence: Secondary | ICD-10-CM | POA: Insufficient documentation

## 2021-05-07 DIAGNOSIS — Z8249 Family history of ischemic heart disease and other diseases of the circulatory system: Secondary | ICD-10-CM | POA: Insufficient documentation

## 2021-05-07 DIAGNOSIS — Z836 Family history of other diseases of the respiratory system: Secondary | ICD-10-CM | POA: Insufficient documentation

## 2021-05-07 DIAGNOSIS — I2510 Atherosclerotic heart disease of native coronary artery without angina pectoris: Secondary | ICD-10-CM | POA: Insufficient documentation

## 2021-05-07 DIAGNOSIS — Z800 Family history of malignant neoplasm of digestive organs: Secondary | ICD-10-CM | POA: Diagnosis not present

## 2021-05-07 DIAGNOSIS — E785 Hyperlipidemia, unspecified: Secondary | ICD-10-CM | POA: Insufficient documentation

## 2021-05-07 DIAGNOSIS — E119 Type 2 diabetes mellitus without complications: Secondary | ICD-10-CM | POA: Insufficient documentation

## 2021-05-07 DIAGNOSIS — J449 Chronic obstructive pulmonary disease, unspecified: Secondary | ICD-10-CM

## 2021-05-07 DIAGNOSIS — Z560 Unemployment, unspecified: Secondary | ICD-10-CM | POA: Insufficient documentation

## 2021-05-07 LAB — CBC WITH DIFFERENTIAL/PLATELET
Abs Immature Granulocytes: 0.01 10*3/uL (ref 0.00–0.07)
Basophils Absolute: 0.1 10*3/uL (ref 0.0–0.1)
Basophils Relative: 1 %
Eosinophils Absolute: 0.1 10*3/uL (ref 0.0–0.5)
Eosinophils Relative: 1 %
HCT: 37.5 % — ABNORMAL LOW (ref 39.0–52.0)
Hemoglobin: 12.1 g/dL — ABNORMAL LOW (ref 13.0–17.0)
Immature Granulocytes: 0 %
Lymphocytes Relative: 40 %
Lymphs Abs: 2.7 10*3/uL (ref 0.7–4.0)
MCH: 28.8 pg (ref 26.0–34.0)
MCHC: 32.3 g/dL (ref 30.0–36.0)
MCV: 89.3 fL (ref 80.0–100.0)
Monocytes Absolute: 0.6 10*3/uL (ref 0.1–1.0)
Monocytes Relative: 9 %
Neutro Abs: 3.2 10*3/uL (ref 1.7–7.7)
Neutrophils Relative %: 49 %
Platelets: 443 10*3/uL — ABNORMAL HIGH (ref 150–400)
RBC: 4.2 MIL/uL — ABNORMAL LOW (ref 4.22–5.81)
RDW: 12.8 % (ref 11.5–15.5)
WBC: 6.6 10*3/uL (ref 4.0–10.5)
nRBC: 0 % (ref 0.0–0.2)

## 2021-05-07 LAB — BASIC METABOLIC PANEL
Anion gap: 7 (ref 5–15)
BUN: 13 mg/dL (ref 6–20)
CO2: 26 mmol/L (ref 22–32)
Calcium: 8.7 mg/dL — ABNORMAL LOW (ref 8.9–10.3)
Chloride: 104 mmol/L (ref 98–111)
Creatinine, Ser: 0.63 mg/dL (ref 0.61–1.24)
GFR, Estimated: >60 mL/min (ref >60)
Glucose, Bld: 186 mg/dL — ABNORMAL HIGH (ref 70–99)
Potassium: 4 mmol/L (ref 3.5–5.1)
Sodium: 137 mmol/L (ref 135–145)

## 2021-05-07 MED ORDER — CYANOCOBALAMIN 1000 MCG/ML IJ SOLN
1000.0000 ug | Freq: Once | INTRAMUSCULAR | Status: AC
  Administered 2021-05-07: 1000 ug via INTRAMUSCULAR
  Filled 2021-05-07: qty 1

## 2021-05-07 MED ORDER — IRON SUCROSE 20 MG/ML IV SOLN
200.0000 mg | Freq: Once | INTRAVENOUS | Status: AC
  Administered 2021-05-07: 200 mg via INTRAVENOUS
  Filled 2021-05-07: qty 10

## 2021-05-07 MED ORDER — SODIUM CHLORIDE 0.9 % IV SOLN
INTRAVENOUS | Status: DC | PRN
  Filled 2021-05-07: qty 250

## 2021-05-07 MED ORDER — SODIUM CHLORIDE 0.9 % IV SOLN: 200.0000 mg | Freq: Once | INTRAVENOUS | Status: DC

## 2021-05-07 NOTE — Progress Notes (Signed)
[facility] NOTE  Patient Care Team: Flinchum, Kelby Aline, [name] as PCP - General (Family Medicine) [name], MD as PCP - Cardiology (Cardiology) Lloyd Huger, MD as Consulting Physician (Oncology) Neldon Labella, RN as Case Manager [name], RPH-CPP as Pharmacist (Pharmacist)  CHIEF COMPLAINTS/PURPOSE OF CONSULTATION: Anemia sec to Hx of GIB  # ANEMIA sec to GIB [Dr.Finnegan; s/p EGD/colo- June 2021-s/p angiodysplasia small bowel/large bowel status post argon laser Dr.Wohl]; Venofer x4 in January 2022-for iron deficiency hemoglobin around 11.  # Thrombocytosis [JAK-2 NEG]  # B12 def- on B12 injections  Oncology History   No history exists.     HISTORY OF PRESENTING ILLNESS: alone walks with a cane.  Benjamin Conley 56 y.o.  male patient with a history of iron deficient anemia secondary to chronic GI bleed/angiodysplasia is here for follow-up.  In the interim recently evaluated by GI. Patient denies any blood in stools or black-colored stools.  Is not on iron pills.  Not on B12.  Denies any fatigue.  Review of Systems  Constitutional:  Positive for malaise/fatigue. Negative for chills, diaphoresis, fever and weight loss.  HENT:  Negative for nosebleeds and sore throat.   Eyes:  Negative for double vision.  Respiratory:  Negative for cough, hemoptysis, sputum production, shortness of breath and wheezing.   Cardiovascular:  Negative for chest pain, palpitations, orthopnea and leg swelling.  Gastrointestinal:  Negative for abdominal pain, blood in stool, constipation, diarrhea, heartburn, melena, nausea and vomiting.  Genitourinary:  Negative for dysuria, frequency and urgency.  Musculoskeletal:  Positive for back pain and joint pain.  Skin: Negative.  Negative for itching and rash.  Neurological:  Negative for dizziness, tingling, focal weakness, weakness and headaches.  Endo/Heme/Allergies:  Does not bruise/bleed easily.   Psychiatric/Behavioral:  Negative for depression. The patient is not nervous/anxious and does not have insomnia.     MEDICAL HISTORY:  Past Medical History:  Diagnosis Date   Acute posthemorrhagic anemia    Complication of anesthesia    c/o difficulty breathing after anesthesia   COPD (chronic obstructive pulmonary disease) (HCC)    Diabetes mellitus without complication ([place])    Hyperlipemia    Melena 08/21/2019   Paroxysmal supraventricular tachycardia (HCC) 08/19/2019   Postural dizziness with presyncope 08/21/2019   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   Tobacco abuse    Upper GI bleed 08/21/2019    SURGICAL HISTORY: Past Surgical History:  Procedure Laterality Date   BACK SURGERY     lumbar   COLONOSCOPY WITH PROPOFOL N/A 08/24/2019   Procedure: COLONOSCOPY WITH PROPOFOL;  Surgeon: [name], MD;  Location: [facility] ENDOSCOPY;  Service: Endoscopy;  Laterality: N/A;   ESOPHAGOGASTRODUODENOSCOPY (EGD) WITH PROPOFOL N/A 08/24/2019   Procedure: ESOPHAGOGASTRODUODENOSCOPY (EGD) WITH PROPOFOL;  Surgeon: [name], MD;  Location: [facility] ENDOSCOPY;  Service: Endoscopy;  Laterality: N/A;   STENT PLACEMENT VASCULAR ([place] [facility])  07/2019   stenosis of distal splenic artery and stent placed   VISCERAL ANGIOGRAPHY N/A 08/06/2019   Procedure: VISCERAL ANGIOGRAPHY;  Surgeon: [name], MD;  Location: [place] [facility];  Service: Cardiovascular;  Laterality: N/A;    SOCIAL HISTORY: Social History   Socioeconomic History   Marital status: Single    Spouse name: Not on file   Number of children: Not on file   Years of education: Not on file   Highest education level: Not on file  Occupational History   Not on file  Tobacco Use   Smoking status: Former  Packs/day: 1.00    Years: 48.00    Pack years: 48.00    Types: Cigarettes    Quit date: 02/12/2020    Years since quitting: 1.2   Smokeless tobacco: Never  Vaping Use   Vaping Use: Never used  Substance and Sexual Activity    Alcohol use: No    Comment: Never been a problem   Drug use: No   Sexual activity: Yes  Other Topics Concern   Not on file  Social History Narrative   Single, lives alone, unemployed.   Social Determinants of Health   Financial Resource Strain: Low Risk    Difficulty of Paying Living Expenses: Not hard at all  Food Insecurity: No Food Insecurity   Worried About Charity fundraiser in the Last Year: Never true   [place] in the Last Year: Never true  Transportation Needs: No Transportation Needs   Lack of Transportation (Medical): No   Lack of Transportation (Non-Medical): No  Physical Activity: Insufficiently Active   Days of Exercise per Week: 4 days   Minutes of Exercise per Session: 20 min  Stress: No Stress Concern Present   Feeling of Stress : Not at all  Social Connections: Unknown   Frequency of Communication with Friends and Family: Not on file   Frequency of Social Gatherings with Friends and Family: More than three times a week   Attends Religious Services: Not on Electrical engineer or Organizations: No   Attends Music therapist: Not on file   Marital Status: Not on file  Intimate Partner Violence: Not At Risk   Fear of Current or Ex-Partner: No   Emotionally Abused: No   Physically Abused: No   Sexually Abused: No    FAMILY HISTORY: Family History  Problem Relation Age of Onset   Diabetes Mother    Diabetes Mellitus II Mother    Tuberculosis Father    Heart disease Father    Diabetes Sister    Diabetes Sister    Diabetes Sister    Diabetes Sister    Diabetes Brother    Diabetes Mellitus II Brother    Hyperlipidemia Brother    Cancer Maternal Aunt    Colon cancer Maternal Uncle    Prostate cancer Neg Hx     ALLERGIES:  is allergic to wasp venom, wasp venom protein, coffee flavor, and onion.  MEDICATIONS:  Current Outpatient Medications  Medication Sig Dispense Refill   albuterol (VENTOLIN HFA) 108 (90 Base)  MCG/ACT inhaler Inhale 2 puffs into the lungs every 6 (six) hours as needed for wheezing or shortness of breath. 6.7 g 3   aspirin 81 MG EC tablet Take 1 tablet (81 mg total) by mouth daily. 90 tablet 3   atorvastatin (LIPITOR) 40 MG tablet Take 1 tablet (40 mg total) by mouth daily. 90 tablet 3   blood glucose meter kit and supplies Dispense based on patient and insurance preference. Use up to four times daily as directed. (FOR ICD-10 E10.9, E11.9). 1 each 0   Cholecalciferol (VITAMIN D) 125 MCG (5000 UT) CAPS Take 1 capsule by mouth daily.     clopidogrel (PLAVIX) 75 MG tablet Take 1 tablet (75 mg total) by mouth daily. 90 tablet 3   digoxin (LANOXIN) 0.125 MG tablet Take 1 tablet (0.125 mg total) by mouth daily. 30 tablet 3   insulin glargine (LANTUS) 100 UNIT/ML Solostar Pen Inject 9 Units into the skin at bedtime. 15 mL  0   insulin lispro (HUMALOG KWIKPEN) 100 UNIT/ML KwikPen Inject 4 Units into the skin 2 (two) times daily with a meal. Lunch and dinner 15 mL 11   Insulin Pen Needle 32G X 4 MM MISC Use as directed to administer insulin 100 each 2   metoprolol succinate (TOPROL-XL) 25 MG 24 hr tablet Take 0.5 tablets (12.5 mg total) by mouth daily. 90 tablet 1   nystatin-triamcinolone ointment (MYCOLOG) Apply 1 application topically 2 (two) times daily. 60 g 0   pantoprazole (PROTONIX) 20 MG tablet Take 1 tablet (20 mg total) by mouth daily. One hour before breakfast 90 tablet 0   Semaglutide, 2 MG/DOSE, 8 MG/3ML SOPN Inject 2 mg as directed once a week. 9 mL 1   sildenafil (VIAGRA) 100 MG tablet Take 1 tablet (100 mg total) by mouth daily as needed for erectile dysfunction. 30 tablet 0   silodosin (RAPAFLO) 8 MG CAPS capsule Take 1 capsule (8 mg total) by mouth daily. 90 capsule 0   triamcinolone cream (KENALOG) 0.1 % Apply 1 application topically 2 (two) times daily. 30 g 0   No current facility-administered medications for this visit.      [name]  PHYSICAL EXAMINATION:   Vitals:    05/07/21 1309  BP: (!) 133/91  Pulse: 98  Temp: (!) 97 F (36.1 C)  SpO2: 100%   Filed Weights   05/07/21 1309  Weight: 187 lb 12.8 oz (85.2 kg)    Physical Exam HENT:     Head: Normocephalic and atraumatic.     Mouth/Throat:     Pharynx: No oropharyngeal exudate.  Eyes:     Pupils: Pupils are equal, round, and reactive to light.  Cardiovascular:     Rate and Rhythm: Normal rate and regular rhythm.  Pulmonary:     Effort: Pulmonary effort is normal. No respiratory distress.     Breath sounds: Normal breath sounds. No wheezing.  Abdominal:     General: Bowel sounds are normal. There is no distension.     Palpations: Abdomen is soft. There is no mass.     Tenderness: There is no abdominal tenderness. There is no guarding or rebound.  Musculoskeletal:        General: No tenderness. Normal range of motion.     Cervical back: Normal range of motion and neck supple.  Skin:    General: Skin is warm.  Neurological:     Mental Status: He is alert and oriented to person, place, and time.  Psychiatric:        Mood and Affect: Affect normal.     LABORATORY DATA:  I have reviewed the data as listed Lab Results  Component Value Date   WBC 6.6 05/07/2021   HGB 12.1 (L) 05/07/2021   HCT 37.5 (L) 05/07/2021   MCV 89.3 05/07/2021   PLT 443 (H) 05/07/2021   Recent Labs    11/03/20 1244 02/23/21 1531 03/08/21 1403 05/07/21 1251  NA 137 137 140 137  K 4.0 4.4 3.9 4.0  CL 102 100 105 104  CO2 27 25 28 26   GLUCOSE 137* 126* 193* 186*  BUN 11 13 14 13   CREATININE 0.67 0.79 0.73 0.63  CALCIUM 8.6* 9.7 8.8 8.7*  GFRNONAA >60  --   --  >60  PROT  --  7.0 6.4  --   ALBUMIN  --  4.5 3.9  --   AST  --  17 13  --   ALT  --  25  18  --   ALKPHOS  --  76 78  --   BILITOT  --  0.4 0.3  --     RADIOGRAPHIC STUDIES: I have personally reviewed the radiological images as listed and agreed with the findings in the report. CT CHEST LUNG CA SCREEN LOW DOSE W/O CM  Result Date:  05/05/2021 CLINICAL DATA:  56 year old male with 100 pack-year history of smoking. Lung cancer screening. EXAM: CT CHEST WITHOUT CONTRAST LOW-DOSE FOR LUNG CANCER SCREENING TECHNIQUE: Multidetector CT imaging of the chest was performed following the standard protocol without IV contrast. RADIATION DOSE REDUCTION: This exam was performed according to the departmental dose-optimization program which includes automated exposure control, adjustment of the mA and/or kV according to patient size and/or use of iterative reconstruction technique. COMPARISON:  CT a chest 09/23/2019 FINDINGS: Cardiovascular: The heart size is normal. No substantial pericardial effusion. Coronary artery calcification is evident. No thoracic aortic aneurysm. Mediastinum/Nodes: No mediastinal lymphadenopathy. No evidence for gross hilar lymphadenopathy although assessment is limited by the lack of intravenous contrast on the current study. The esophagus has normal imaging features. There is no axillary lymphadenopathy. Lungs/Pleura: Centrilobular emphsyema noted. Calcified 8.8 mm nodule in the posterior left lower lobe (image 238/series 3) present since and stable since 08/01/2019, compatible with granuloma. Additional scattered tiny bilateral pulmonary nodules evident. No suspicious pulmonary nodule or mass. No focal airspace consolidation. No pleural effusion. Upper Abdomen: Unremarkable. Musculoskeletal: No worrisome lytic or sclerotic osseous abnormality. IMPRESSION: 1. Lung-RADS 2, benign appearance or behavior. Continue annual screening with low-dose chest CT without contrast in 12 months. 2. Coronary artery atherosclerosis. 3. Emphysema (ICD10-J43.9). Electronically Signed   By: Misty Stanley M.D.   On: 05/05/2021 10:54   ASSESSMENT & PLAN:   Iron deficiency anemia due to chronic blood loss # Iron deficiency anemia-hemoglobin 12.1; however given recent iron deficiency proceed with Venofer today.   #Etiology- EGD/Colo [Dr.Wohl] June  2021 revealed a total of 4 angiodysplastic lesions that were cauterized with argon laser. Monitor closely.   # Thrombocytosis-443; JAK2 mutation negative; question other MPN Vs-reactive secondary deficiency.  Monitor for now.  # B12 deficiency: Proceed with 1000 mcg intramuscular B12 today  # DISPOSITION: # venofer today # b12 injection TODAY # follow up in 6 months- MD; labs- cbc/bmp; iron studies/ferritin/ B12 levels--- venofer/B12-Dr.B   All questions were answered. The patient knows to call the clinic with any problems, questions or concerns.    Cammie Sickle, MD 05/07/2021 1:32 PM

## 2021-05-07 NOTE — Telephone Encounter (Deleted)
°  Pt advised of results. Pt stated he thinks he has a pulmonary Dr, but not sure if he does or who it is.  I advised pt that I would have a referral placed for one just in case.  I also let him know that he may receive a call from a pulmonary office to schedule due to the referral. Pt advised that If he does already have dr, then he can disregard the phone call, and let us know who he sees.  Please place referral for Pulmo.          Flinchum, Kelby Aline, [name]  [facility] A, CMA Emphysema on lung scan, recommend follow up with pulmonary if he is in agreement with referral.  Coronary artery disease also noted, he has cardiology and should keep follow. Let me know.

## 2021-05-07 NOTE — Assessment & Plan Note (Addendum)
#   Iron deficiency anemia-hemoglobin 12.1; however given recent iron deficiency proceed with Venofer today.   #Etiology- EGD/Colo [Dr.Wohl] June 2021 revealed a total of 4 angiodysplastic lesions that were cauterized with argon laser. Monitor closely.   # Thrombocytosis-443; JAK2 mutation negative; question other MPN Vs-reactive secondary deficiency.  Monitor for now.  # B12 deficiency: Proceed with 1000 mcg intramuscular B12 today  # DISPOSITION: # venofer today # b12 injection TODAY # follow up in 6 months- MD; labs- cbc/bmp; iron studies/ferritin/ B12 levels--- venofer/B12-Dr.B

## 2021-05-07 NOTE — Progress Notes (Signed)
Pt C/O of knot on the back of his neck.

## 2021-05-07 NOTE — Telephone Encounter (Signed)
-----   Message from Doreen Beam, [place] sent at 05/07/2021 11:09 AM EST ----- Emphysema on lung scan, recommend follow up with pulmonary if he is in agreement with referral.  Coronary artery disease also noted, he has cardiology and should keep follow. Let me know.  ----- Message ----- From: Joella Prince, RN Sent: 05/07/2021  10:43 AM EST To: Doreen Beam, [name]

## 2021-05-08 ENCOUNTER — Other Ambulatory Visit: Payer: Self-pay

## 2021-05-08 ENCOUNTER — Encounter: Payer: Self-pay | Admitting: Adult Health

## 2021-05-08 ENCOUNTER — Ambulatory Visit (INDEPENDENT_AMBULATORY_CARE_PROVIDER_SITE_OTHER): Payer: Medicare Other | Admitting: Adult Health

## 2021-05-08 VITALS — BP 134/86 | HR 92 | Ht 64.0 in | Wt 186.6 lb

## 2021-05-08 DIAGNOSIS — E785 Hyperlipidemia, unspecified: Secondary | ICD-10-CM | POA: Diagnosis not present

## 2021-05-08 DIAGNOSIS — E119 Type 2 diabetes mellitus without complications: Secondary | ICD-10-CM

## 2021-05-08 DIAGNOSIS — I951 Orthostatic hypotension: Secondary | ICD-10-CM | POA: Diagnosis not present

## 2021-05-08 NOTE — Assessment & Plan Note (Addendum)
Seen cardiology discontinued Toprol, also added digoxin and discontinued Flomax with ok from urology, started Rapaflo. Symptoms seem to be stable and slightly improved.  Keep follow up with cardiology, neurology and urology advised.

## 2021-05-08 NOTE — Progress Notes (Signed)
Established Patient Office Visit  Subjective:  Patient ID: Benjamin Conley, male    DOB: January 01, 1966  Age: 56 y.o. MRN: [id number]  CC:  Chief Complaint  Patient presents with   Follow-up    HPI Benjamin Conley presents for follow up for chronic care.   Cardiology had urology change his Flomax to Rapaflo to see if causing orthostatics. He sees Zara Council for BPH. Scheduled for prostate biopsy was referred due to the raise in PSA.  He saw Dr. Remonia Richter- Etang on 05/01/21 for orthostatic hypotension, Toprol was stopped and digoxin started.   Rash as improved, started on medication for contact dermatitis and eczema.   Mild emphysema on CT scan low dose for tobacco use.  Followed by hematology/ oncology  Dr. Patrecia Pace for anemia.  Doing well due for Hemoglobin A1C blood glucose on meter 120- to highest 150 with average 130. He has missed the lantus at bedtime dose a few days. Has a follow up with [facility] upcoming.   Patient  denies any fever, body aches,chills, rash, chest pain, shortness of breath, nausea, vomiting, or diarrhea.  Denies any new dizziness, lightheadedness, pre syncopal or syncopal episodes.    Past Medical History:  Diagnosis Date   Acute posthemorrhagic anemia    Complication of anesthesia    c/o difficulty breathing after anesthesia   COPD (chronic obstructive pulmonary disease) (HCC)    Diabetes mellitus without complication ([place])    Hyperlipemia    Melena 08/21/2019   Paroxysmal supraventricular tachycardia (HCC) 08/19/2019   Postural dizziness with presyncope 08/21/2019   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   Tobacco abuse    Upper GI bleed 08/21/2019    Past Surgical History:  Procedure Laterality Date   BACK SURGERY     lumbar   COLONOSCOPY WITH PROPOFOL N/A 08/24/2019   Procedure: COLONOSCOPY WITH PROPOFOL;  Surgeon: [name], MD;  Location: [facility] ENDOSCOPY;  Service: Endoscopy;  Laterality: N/A;   ESOPHAGOGASTRODUODENOSCOPY (EGD) WITH  PROPOFOL N/A 08/24/2019   Procedure: ESOPHAGOGASTRODUODENOSCOPY (EGD) WITH PROPOFOL;  Surgeon: [name], MD;  Location: [facility] ENDOSCOPY;  Service: Endoscopy;  Laterality: N/A;   STENT PLACEMENT VASCULAR ([place] [facility])  07/2019   stenosis of distal splenic artery and stent placed   VISCERAL ANGIOGRAPHY N/A 08/06/2019   Procedure: VISCERAL ANGIOGRAPHY;  Surgeon: [name], MD;  Location: [place] [facility];  Service: Cardiovascular;  Laterality: N/A;    Family History  Problem Relation Age of Onset   Diabetes Mother    Diabetes Mellitus II Mother    Tuberculosis Father    Heart disease Father    Diabetes Sister    Diabetes Sister    Diabetes Sister    Diabetes Sister    Diabetes Brother    Diabetes Mellitus II Brother    Hyperlipidemia Brother    Cancer Maternal Aunt    Colon cancer Maternal Uncle    Prostate cancer Neg Hx     Social History   Socioeconomic History   Marital status: Single    Spouse name: Not on file   Number of children: Not on file   Years of education: Not on file   Highest education level: Not on file  Occupational History   Not on file  Tobacco Use   Smoking status: Former    Packs/day: 1.00    Years: 48.00    Pack years: 48.00    Types: Cigarettes    Quit date: 02/12/2020    Years since quitting: 1.2  Smokeless tobacco: Never  Vaping Use   Vaping Use: Never used  Substance and Sexual Activity   Alcohol use: No    Comment: Never been a problem   Drug use: No   Sexual activity: Yes  Other Topics Concern   Not on file  Social History Narrative   Single, lives alone, unemployed.   Social Determinants of Health   Financial Resource Strain: Low Risk    Difficulty of Paying Living Expenses: Not hard at all  Food Insecurity: No Food Insecurity   Worried About Charity fundraiser in the Last Year: Never true   [place] in the Last Year: Never true  Transportation Needs: No Transportation Needs   Lack of Transportation  (Medical): No   Lack of Transportation (Non-Medical): No  Physical Activity: Insufficiently Active   Days of Exercise per Week: 4 days   Minutes of Exercise per Session: 20 min  Stress: No Stress Concern Present   Feeling of Stress : Not at all  Social Connections: Unknown   Frequency of Communication with Friends and Family: Not on file   Frequency of Social Gatherings with Friends and Family: More than three times a week   Attends Religious Services: Not on Electrical engineer or Organizations: No   Attends Music therapist: Not on file   Marital Status: Not on file  Intimate Partner Violence: Not At Risk   Fear of Current or Ex-Partner: No   Emotionally Abused: No   Physically Abused: No   Sexually Abused: No    Outpatient Medications Prior to Visit  Medication Sig Dispense Refill   albuterol (VENTOLIN HFA) 108 (90 Base) MCG/ACT inhaler Inhale 2 puffs into the lungs every 6 (six) hours as needed for wheezing or shortness of breath. 6.7 g 3   aspirin 81 MG EC tablet Take 1 tablet (81 mg total) by mouth daily. 90 tablet 3   atorvastatin (LIPITOR) 40 MG tablet Take 1 tablet (40 mg total) by mouth daily. 90 tablet 3   blood glucose meter kit and supplies Dispense based on patient and insurance preference. Use up to four times daily as directed. (FOR ICD-10 E10.9, E11.9). 1 each 0   Cholecalciferol (VITAMIN D) 125 MCG (5000 UT) CAPS Take 1 capsule by mouth daily.     clopidogrel (PLAVIX) 75 MG tablet Take 1 tablet (75 mg total) by mouth daily. 90 tablet 3   digoxin (LANOXIN) 0.125 MG tablet Take 1 tablet (0.125 mg total) by mouth daily. 30 tablet 3   insulin glargine (LANTUS) 100 UNIT/ML Solostar Pen Inject 9 Units into the skin at bedtime. 15 mL 0   insulin lispro (HUMALOG KWIKPEN) 100 UNIT/ML KwikPen Inject 4 Units into the skin 2 (two) times daily with a meal. Lunch and dinner 15 mL 11   Insulin Pen Needle 32G X 4 MM MISC Use as directed to administer insulin  100 each 2   nystatin-triamcinolone ointment (MYCOLOG) Apply 1 application topically 2 (two) times daily. 60 g 0   pantoprazole (PROTONIX) 20 MG tablet Take 1 tablet (20 mg total) by mouth daily. One hour before breakfast 90 tablet 0   Semaglutide, 2 MG/DOSE, 8 MG/3ML SOPN Inject 2 mg as directed once a week. 9 mL 1   sildenafil (VIAGRA) 100 MG tablet Take 1 tablet (100 mg total) by mouth daily as needed for erectile dysfunction. 30 tablet 0   silodosin (RAPAFLO) 8 MG CAPS capsule Take 1 capsule (  8 mg total) by mouth daily. 90 capsule 0   triamcinolone cream (KENALOG) 0.1 % Apply 1 application topically 2 (two) times daily. 30 g 0   metoprolol succinate (TOPROL-XL) 25 MG 24 hr tablet Take 0.5 tablets (12.5 mg total) by mouth daily. (Patient not taking: Reported on 05/08/2021) 90 tablet 1   0.9 %  sodium chloride infusion      No facility-administered medications prior to visit.    Allergies  Allergen Reactions   Wasp Venom Anaphylaxis   Wasp Venom Protein Anaphylaxis   Coffee Flavor Nausea And Vomiting    Patient states coffee gives him nausea and stomach cramps   Onion Nausea And Vomiting    ROS Review of Systems  Constitutional: Negative.   HENT: Negative.    Respiratory: Negative.    Cardiovascular: Negative.   Gastrointestinal: Negative.   Genitourinary:  Positive for difficulty urinating (scheduled for follow up with Urology this is chronic.).  Musculoskeletal: Negative.   Neurological:  Positive for dizziness (chronic stable, and improved with cardiology medication changes.) and light-headedness (chronic stable, and improved with cardiology medication changes.). Negative for tremors, seizures, syncope, speech difficulty, numbness and headaches.     Objective:    Physical Exam Nursing note reviewed.  Constitutional:      General: He is not in acute distress.    Appearance: He is obese. He is not ill-appearing, toxic-appearing or diaphoretic.  HENT:     Head:  Normocephalic and atraumatic.     Right Ear: Tympanic membrane, ear canal and external ear normal. There is no impacted cerumen.     Left Ear: Tympanic membrane, ear canal and external ear normal. There is no impacted cerumen.     Nose: Nose normal. No congestion or rhinorrhea.     Mouth/Throat:     Pharynx: Oropharynx is clear.  Eyes:     General: No scleral icterus.       Right eye: No discharge.        Left eye: No discharge.     Conjunctiva/sclera: Conjunctivae normal.     Pupils: Pupils are equal, round, and reactive to light.  Cardiovascular:     Rate and Rhythm: Normal rate and regular rhythm.     Pulses: Normal pulses.     Heart sounds: Normal heart sounds.  Pulmonary:     Effort: Pulmonary effort is normal.     Breath sounds: Normal breath sounds.  Abdominal:     General: There is no distension.     Palpations: Abdomen is soft. There is no mass.     Tenderness: There is no abdominal tenderness. There is no right CVA tenderness, left CVA tenderness, guarding or rebound.     Hernia: No hernia is present.  Musculoskeletal:        General: Normal range of motion.     Cervical back: Normal range of motion and neck supple.     Right lower leg: No edema.     Left lower leg: No edema.  Skin:    General: Skin is warm.  Neurological:     Mental Status: He is oriented to person, place, and time.  Psychiatric:        Mood and Affect: Mood normal.        Behavior: Behavior normal.        Thought Content: Thought content normal.        Judgment: Judgment normal.    BP 134/86 (BP Location: Left Arm, Patient Position: Sitting, Cuff Size: Normal)  Pulse 92    Ht 5' 4"  (1.626 m)    Wt 186 lb 9.6 oz (84.6 kg)    SpO2 98%    BMI 32.03 kg/m  Wt Readings from Last 3 Encounters:  05/08/21 186 lb 9.6 oz (84.6 kg)  05/07/21 187 lb 12.8 oz (85.2 kg)  05/01/21 190 lb (86.2 kg)     There are no preventive care reminders to display for this patient.  There are no preventive care  reminders to display for this patient.  Lab Results  Component Value Date   TSH 1.370 02/23/2021   Lab Results  Component Value Date   WBC 6.6 05/07/2021   HGB 12.1 (L) 05/07/2021   HCT 37.5 (L) 05/07/2021   MCV 89.3 05/07/2021   PLT 443 (H) 05/07/2021   Lab Results  Component Value Date   NA 137 05/07/2021   K 4.0 05/07/2021   CO2 26 05/07/2021   GLUCOSE 186 (H) 05/07/2021   BUN 13 05/07/2021   CREATININE 0.63 05/07/2021   BILITOT 0.3 03/08/2021   ALKPHOS 78 03/08/2021   AST 13 03/08/2021   ALT 18 03/08/2021   PROT 6.4 03/08/2021   ALBUMIN 3.9 03/08/2021   CALCIUM 8.7 (L) 05/07/2021   ANIONGAP 7 05/07/2021   EGFR 105 02/23/2021   GFR 102.52 03/08/2021   Lab Results  Component Value Date   CHOL 138 02/23/2021   Lab Results  Component Value Date   HDL 41 02/23/2021   Lab Results  Component Value Date   LDLCALC 74 02/23/2021   Lab Results  Component Value Date   TRIG 126 02/23/2021   Lab Results  Component Value Date   CHOLHDL 3.4 02/23/2021   Lab Results  Component Value Date   HGBA1C 7.9 (H) 02/23/2021      Assessment & Plan:   Problem List Items Addressed This Visit       Cardiovascular and Mediastinum   Orthostatic hypotension    Seen cardiology discontinued Toprol, also added digoxin and discontinued Flomax with ok from urology, started Rapaflo. Symptoms seem to be stable and slightly improved.  Keep follow up with cardiology, neurology and urology advised.         Endocrine   Diabetes mellitus without complication ([place]) - Primary    Due for hemoglobin A1C has been missing doses of Lantus.  Check A1C and schedule follow up for diabetes. Diet nutrition information given. Take medications as prescribed. [facility] pharmacy follow up scheduled as well.       Relevant Orders   Hemoglobin A1c   He is advised this provider is leaving the office March 23 rd, Mychart letter was sent he will have to establish primary care with another provider.  Patient verbalized understanding of all instructions given and denies any further questions at this time.    No orders of the defined types were placed in this encounter. Return in about 2 months (around 07/06/2021), or if symptoms worsen or fail to improve, for at any time for any worsening symptoms, Go to Emergency room/ urgent care if worse.   Follow-up: Return in about 2 months (around 07/06/2021), or if symptoms worsen or fail to improve, for at any time for any worsening symptoms, Go to Emergency room/ urgent care if worse.    Marcille Buffy, [name]

## 2021-05-08 NOTE — Assessment & Plan Note (Signed)
Due for hemoglobin A1C has been missing doses of Lantus.  Check A1C and schedule follow up for diabetes. Diet nutrition information given. Take medications as prescribed. [facility] pharmacy follow up scheduled as well.

## 2021-05-08 NOTE — Assessment & Plan Note (Addendum)
Due for hemoglobin A1C , has been missing doses of Lantus.  Check A1C and schedule follow up for diabetes. Diet nutrition information given. Take medications as prescribed. [facility] pharmacy follow up scheduled as well.

## 2021-05-08 NOTE — Patient Instructions (Addendum)
Diabetes Mellitus and Nutrition, Adult When you have diabetes, or diabetes mellitus, it is very important to have healthy eating habits because your blood sugar (glucose) levels are greatly affected by what you eat and drink. Eating healthy foods in the right amounts, at about the same times every day, can help you: Manage your blood glucose. Lower your risk of heart disease. Improve your blood pressure. Reach or maintain a healthy weight. What can affect my meal plan? Every person with diabetes is different, and each person has different needs for a meal plan. Your health care provider may recommend that you work with a dietitian to make a meal plan that is best for you. Your meal plan may vary depending on factors such as: The calories you need. The medicines you take. Your weight. Your blood glucose, blood pressure, and cholesterol levels. Your activity level. Other health conditions you have, such as heart or kidney disease. How do carbohydrates affect me? Carbohydrates, also called carbs, affect your blood glucose level more than any other type of food. Eating carbs raises the amount of glucose in your blood. It is important to know how many carbs you can safely have in each meal. This is different for every person. Your dietitian can help you calculate how many carbs you should have at each meal and for each snack. How does alcohol affect me? Alcohol can cause a decrease in blood glucose (hypoglycemia), especially if you use insulin or take certain diabetes medicines by mouth. Hypoglycemia can be a life-threatening condition. Symptoms of hypoglycemia, such as sleepiness, dizziness, and confusion, are similar to symptoms of having too much alcohol. Do not drink alcohol if: Your health care provider tells you not to drink. You are pregnant, may be pregnant, or are planning to become pregnant. If you drink alcohol: Limit how much you have to: 0-1 drink a day for women. 0-2 drinks a day  for men. Know how much alcohol is in your drink. In the [place] one drink equals one 12 oz bottle of beer (355 mL), one 5 oz glass of wine (148 mL), or one 1 oz glass of hard liquor (44 mL). Keep yourself hydrated with water, diet soda, or unsweetened iced tea. Keep in mind that regular soda, juice, and other mixers may contain a lot of sugar and must be counted as carbs. What are tips for following this plan? Reading food labels Start by checking the serving size on the Nutrition Facts label of packaged foods and drinks. The number of calories and the amount of carbs, fats, and other nutrients listed on the label are based on one serving of the item. Many items contain more than one serving per package. Check the total grams (g) of carbs in one serving. Check the number of grams of saturated fats and trans fats in one serving. Choose foods that have a low amount or none of these fats. Check the number of milligrams (mg) of salt (sodium) in one serving. Most people should limit total sodium intake to less than 2,300 mg per day. Always check the nutrition information of foods labeled as "low-fat" or "nonfat." These foods may be higher in added sugar or refined carbs and should be avoided. Talk to your dietitian to identify your daily goals for nutrients listed on the label. Shopping Avoid buying canned, pre-made, or processed foods. These foods tend to be high in fat, sodium, and added sugar. Shop around the outside edge of the grocery store. This is where you will  most often find fresh fruits and vegetables, bulk grains, fresh meats, and fresh dairy products. Cooking Use low-heat cooking methods, such as baking, instead of high-heat cooking methods, such as deep frying. Cook using healthy oils, such as olive, canola, or sunflower oil. Avoid cooking with butter, cream, or high-fat meats. Meal planning Eat meals and snacks regularly, preferably at the same times every day. Avoid going long periods of  time without eating. Eat foods that are high in fiber, such as fresh fruits, vegetables, beans, and whole grains. Eat 4-6 oz (112-168 g) of lean protein each day, such as lean meat, chicken, fish, eggs, or tofu. One ounce (oz) (28 g) of lean protein is equal to: 1 oz (28 g) of meat, chicken, or fish. 1 egg.  cup (62 g) of tofu. Eat some foods each day that contain healthy fats, such as avocado, nuts, seeds, and fish. What foods should I eat? Fruits Berries. Apples. Oranges. Peaches. Apricots. Plums. Grapes. Mangoes. Papayas. Pomegranates. Kiwi. Cherries. Vegetables Leafy greens, including lettuce, spinach, kale, chard, collard greens, mustard greens, and cabbage. Beets. Cauliflower. Broccoli. Carrots. Green beans. Tomatoes. Peppers. Onions. Cucumbers. Brussels sprouts. Grains Whole grains, such as whole-wheat or whole-grain bread, crackers, tortillas, cereal, and pasta. Unsweetened oatmeal. Quinoa. Brown or wild rice. Meats and other proteins Seafood. Poultry without skin. Lean cuts of poultry and beef. Tofu. Nuts. Seeds. Dairy Low-fat or fat-free dairy products such as milk, yogurt, and cheese. The items listed above may not be a complete list of foods and beverages you can eat and drink. Contact a dietitian for more information. What foods should I avoid? Fruits Fruits canned with syrup. Vegetables Canned vegetables. Frozen vegetables with butter or cream sauce. Grains Refined white flour and flour products such as bread, pasta, snack foods, and cereals. Avoid all processed foods. Meats and other proteins Fatty cuts of meat. Poultry with skin. Breaded or fried meats. Processed meat. Avoid saturated fats. Dairy Full-fat yogurt, cheese, or milk. Beverages Sweetened drinks, such as soda or iced tea. The items listed above may not be a complete list of foods and beverages you should avoid. Contact a dietitian for more information. Questions to ask a health care provider Do I need to  meet with a certified diabetes care and education specialist? Do I need to meet with a dietitian? What number can I call if I have questions? When are the best times to check my blood glucose? Where to find more information: American Diabetes Association: diabetes.org Academy of Nutrition and Dietetics: eatright.[employer] of Diabetes and Digestive and Kidney Diseases: [employer]is Association of Diabetes Care & Education Specialists: diabeteseducator.org Summary It is important to have healthy eating habits because your blood sugar (glucose) levels are greatly affected by what you eat and drink. It is important to use alcohol carefully. A healthy meal plan will help you manage your blood glucose and lower your risk of heart disease. Your health care provider may recommend that you work with a dietitian to make a meal plan that is best for you. This information is not intended to replace advice given to you by your health care provider. Make sure you discuss any questions you have with your health care provider. Document Revised: 09/29/2019 Document Reviewed: 09/29/2019 Elsevier Patient Education  [place]. Chronic Obstructive Pulmonary Disease Chronic obstructive pulmonary disease (COPD) is a long-term (chronic) condition that affects the lungs. COPD is a general term that can be used to describe many different lung problems that cause lung inflammation  and limit airflow, including chronic bronchitis and emphysema. If you have COPD, your lung function will probably never return to normal. In most cases, it gets worse over time. However, there are steps you can take to slow the progression of the disease and improve your quality of life. What are the causes? This condition may be caused by: Smoking. This is the most common cause. Certain genes passed down through families. What increases the risk? The following factors may make you more likely to develop this  condition: Being exposed to secondhand smoke from cigarettes, pipes, or cigars. Being exposed to chemicals and other irritants, such as fumes and dust in the work environment. Having chronic lung conditions or infections. What are the signs or symptoms? Symptoms of this condition include: Shortness of breath, especially during physical activity. Chronic cough with a large amount of thick mucus. Sometimes, the cough may not have any mucus (dry cough). Wheezing and rapid breathing. Gray or bluish discoloration (cyanosis) of the skin, especially in the fingers, toes, or lips. Feeling tired (fatigue). Weight loss. Chest tightness. Frequent infections. Episodes when breathing symptoms become much worse (exacerbations). At the later stages of this disease, you may have swelling in the ankles, feet, or legs. How is this diagnosed? This condition is diagnosed based on: Your medical history. A physical exam. You may also have tests, including: Lung (pulmonary) function tests. This may include a spirometry test, which measures your ability to exhale properly. Chest X-ray. CT scan. Blood tests. How is this treated? This condition may be treated with: Medicines. These may include inhaled rescue medicines to treat acute exacerbations as well as medicines that you take long-term (maintenance medicines) to prevent flare-ups of COPD. Bronchodilators help treat COPD by dilating the airways to allow increased airflow and make your breathing more comfortable. Steroids can reduce airway inflammation and help prevent exacerbations. Smoking cessation. If you smoke, your health care provider may ask you to quit, and may also recommend therapy or replacement products to help you quit. Pulmonary rehabilitation. This may involve working with a team of health care providers and specialists, such as respiratory, occupational, and physical therapists. Exercise and physical activity. These are beneficial for  nearly all people with COPD. Nutrition therapy to gain weight, if you are underweight. Oxygen. Supplemental oxygen therapy is only helpful if you have a low oxygen level in your blood (hypoxemia). Lung surgery or transplant. Palliative care. This is to help people with COPD feel comfortable when treatment is no longer working. Follow these instructions at home: Medicines Take over-the-counter and prescription medicines only as told by your health care provider. This includes inhaled medicines and pills. Talk to your health care provider before taking any cough or allergy medicines. You may need to avoid certain medicines that dry out your airways. Lifestyle If you smoke, the most important thing that you can do is to stop smoking. Continuing to smoke will cause the disease to progress faster. Do not use any products that contain nicotine or tobacco. These products include cigarettes, chewing tobacco, and vaping devices, such as e-cigarettes. If you need help quitting, ask your health care provider. Avoid exposure to things that irritate your lungs, such as smoke, chemicals, and fumes. Stay active, but balance activity with periods of rest. Exercise and physical activity will help you maintain your ability to do things you want to do. Learn and use relaxation techniques to manage stress and to control your breathing. Get the right amount of sleep and get quality sleep.  Most adults need 7 or more hours per night. Eat healthy foods. Eating smaller, more frequent meals and resting before meals may help you maintain your strength. Controlled breathing Learn and use controlled breathing techniques as directed by your health care provider. Controlled breathing techniques include: Pursed lip breathing. Start by breathing in (inhaling) through your nose for 1 second. Then, purse your lips as if you were going to whistle and breathe out (exhale) through the pursed lips for 2 seconds. Diaphragmatic  breathing. Start by putting one hand on your abdomen just above your waist. Inhale slowly through your nose. The hand on your abdomen should move out. Then purse your lips and exhale slowly. You should be able to feel the hand on your abdomen moving in as you exhale.  Controlled coughing Learn and use controlled coughing to clear mucus from your lungs. Controlled coughing is a series of short, progressive coughs. The steps of controlled coughing are: Lean your head slightly forward. Breathe in deeply using diaphragmatic breathing. Try to hold your breath for 3 seconds. Keep your mouth slightly open while coughing twice. Spit any mucus out into a tissue. Rest and repeat the steps once or twice as needed. General instructions Make sure you receive all the vaccines that your health care provider recommends, especially the pneumococcal and influenza vaccines. Preventing infection and hospitalization is very important when you have COPD. Drink enough fluid to keep your urine pale yellow, unless you have a medical condition that requires fluid restriction. Use oxygen therapy and pulmonary rehabilitation if told by your health care provider. If you require home oxygen therapy, ask your health care provider whether you should purchase a pulse oximeter to measure your oxygen level at home. Work with your health care provider to develop a COPD action plan. This will help you know what steps to take if your condition gets worse. Keep other chronic health conditions under control as told by your health care provider. Avoid extreme temperature and humidity changes. Avoid contact with people who have an illness that spreads from person to person (is contagious), such as viral infections or pneumonia. Keep all follow-up visits. This is important. Contact a health care provider if: You are coughing up more mucus than usual. There is a change in the color or thickness of your mucus. Your breathing is more  labored than usual. Your breathing is faster than usual. You have difficulty sleeping. You need to use your rescue medicines or inhalers more often than expected. You have trouble doing routine activities such as getting dressed or walking around the house. Get help right away if: You have shortness of breath while you are resting. You have shortness of breath that prevents you from: Being able to talk. Performing your usual physical activities. You have chest pain lasting longer than 5 minutes. Your skin color is more blue (cyanotic) than usual. You measure low oxygen saturations for longer than 5 minutes with a pulse oximeter. You have a fever. You feel too tired to breathe normally. These symptoms may represent a serious problem that is an emergency. Do not wait to see if the symptoms will go away. Get medical help right away. Call your local emergency services (911 in the [place] Do not drive yourself to the hospital. Summary Chronic obstructive pulmonary disease (COPD) is a long-term (chronic) condition that affects the lungs. Your lung function will probably never return to normal. In most cases, it gets worse over time. However, there are steps you can take to  slow the progression of the disease and improve your quality of life. Treatment for COPD may include taking medicines, quitting smoking, pulmonary rehabilitation, and changes to diet and exercise. As the disease progresses, you may need oxygen therapy, a lung transplant, or palliative care. To help manage your condition, do not smoke, avoid exposure to things that irritate your lungs, stay up to date on all vaccines, and follow your health care provider's instructions for taking medicines. This information is not intended to replace advice given to you by your health care provider. Make sure you discuss any questions you have with your health care provider. Document Revised: 01/04/2020 Document Reviewed: 01/04/2020 Elsevier Patient  Education  2022 [employer].

## 2021-05-09 ENCOUNTER — Other Ambulatory Visit (INDEPENDENT_AMBULATORY_CARE_PROVIDER_SITE_OTHER): Payer: Medicare Other

## 2021-05-09 ENCOUNTER — Other Ambulatory Visit: Payer: Self-pay

## 2021-05-09 DIAGNOSIS — E119 Type 2 diabetes mellitus without complications: Secondary | ICD-10-CM | POA: Diagnosis not present

## 2021-05-09 LAB — HEMOGLOBIN A1C: Hgb A1c MFr Bld: 7.8 % — ABNORMAL HIGH (ref 4.6–6.5)

## 2021-05-11 DIAGNOSIS — E1165 Type 2 diabetes mellitus with hyperglycemia: Secondary | ICD-10-CM | POA: Diagnosis not present

## 2021-05-14 ENCOUNTER — Ambulatory Visit: Payer: Self-pay | Admitting: Pharmacist

## 2021-05-14 NOTE — Chronic Care Management (AMB) (Signed)
?  Chronic Care Management  ? ?Note ? ?05/14/2021 ?Name: Benjamin Conley MRN: [id number] DOB: 01/29/1966 ? ? ? ?Closing pharmacy CCM case at this time. Will collaborate with Care Guide to outreach to schedule follow up with RN CM. Patient has clinic contact information for future questions or concerns.  ? ?Catie Darnelle Maffucci, PharmD, [place], CPP ?Clinical Pharmacist ?Therapist, music at [employer] ?[phone] ? ?

## 2021-05-15 ENCOUNTER — Encounter: Payer: Self-pay | Admitting: Adult Health

## 2021-05-17 NOTE — Telephone Encounter (Signed)
Pt scheduled for 3/27 at 2pm for Establish care visit ?

## 2021-05-23 ENCOUNTER — Telehealth: Payer: Self-pay | Admitting: Cardiology

## 2021-05-23 ENCOUNTER — Telehealth: Payer: Self-pay | Admitting: Adult Health

## 2021-05-23 NOTE — Telephone Encounter (Signed)
Benjamin Conley from [facility] urology called about some faxes that have not been received back to their office regarding needing clearance for plavix and aspirin. Janett Billow said they need clearance from office five to seven days prior to biopsy need to know if has to stop taking medication. Janett Billow said [facility] prescribed medication ?

## 2021-05-23 NOTE — Telephone Encounter (Signed)
[facility] is aware and stated that they would get the clearance faxed to [facility].  ?

## 2021-05-23 NOTE — Telephone Encounter (Signed)
Not sure who this needs to go to. PCP is Sharyn Lull, their office states that Dr. Derrel Nip prescribed the medication in question and pt has a follow up with Joycelyn Schmid.  ?

## 2021-05-23 NOTE — Telephone Encounter (Signed)
Sent cardiac clearance to [facility] ?

## 2021-05-23 NOTE — Telephone Encounter (Signed)
Per Dr. Lupita Dawn office, urology will need to obtain clearance from [facility] before anyone can advise for the patient to be off his Plavix for 5-7 days prior to the prostate biopsy. ? ? ?

## 2021-05-23 NOTE — Telephone Encounter (Signed)
? ?  Pre-operative Risk Assessment  ?  ?Patient Name: Benjamin Conley  ?DOB: [birth year] ?MRN: [id number]  ? ?  ? ?Request for Surgical Clearance   ? ?Procedure:   prostate biopsy  ? ?Date of Surgery:  Clearance TBD                              ?   ?Surgeon:  not listed ?Surgeon's Group or Practice Name:  [facility]  ?Phone number:  [phone] ?Fax number:  [phone] ?  ?Type of Clearance Requested:   ?- Pharmacy:  Hold Aspirin and Clopidogrel (Plavix) hold 5 to 7 days prior ?  ?Type of Anesthesia:  Not Indicated ?  ?Additional requests/questions:   ? ?Signed, ?Caryl Pina Gerringer   ?05/23/2021, 2:29 PM  ? ?

## 2021-05-24 NOTE — Telephone Encounter (Signed)
? ? ?  Patient Name: Benjamin Conley  ?DOB: Nov 04, 1965 ?MRN: [id number] ? ?Primary Cardiologist: [name], MD ? ?Chart reviewed as part of pre-operative protocol coverage. Patient has upcoming prostate biopsy planned and we were asked to give our recommendations for holding Aspirin and Plavix. Patient is on Aspirin and Plavix given history of splenic artery thrombosis s/p stenting. This is managed by Vascular Surgery. Therefore, I would defer recommendations for holding antiplatelet therapy to Vascular Surgery. Please reach out to them.  ? ?I will route this recommendation to the requesting party via Epic fax function and remove from pre-op pool. ? ?Please call with questions. ? ?Darreld Mclean, PA-C ?05/24/2021, 8:03 AM ? ?

## 2021-05-29 ENCOUNTER — Other Ambulatory Visit: Payer: Self-pay | Admitting: Family

## 2021-05-29 DIAGNOSIS — K219 Gastro-esophageal reflux disease without esophagitis: Secondary | ICD-10-CM

## 2021-06-04 ENCOUNTER — Ambulatory Visit (INDEPENDENT_AMBULATORY_CARE_PROVIDER_SITE_OTHER): Payer: Medicare Other | Admitting: Family

## 2021-06-04 ENCOUNTER — Other Ambulatory Visit: Payer: Self-pay

## 2021-06-04 ENCOUNTER — Encounter: Payer: Self-pay | Admitting: Family

## 2021-06-04 VITALS — BP 128/76 | HR 117 | Temp 97.6°F | Ht 64.0 in | Wt 186.0 lb

## 2021-06-04 DIAGNOSIS — E119 Type 2 diabetes mellitus without complications: Secondary | ICD-10-CM | POA: Diagnosis not present

## 2021-06-04 DIAGNOSIS — I951 Orthostatic hypotension: Secondary | ICD-10-CM

## 2021-06-04 DIAGNOSIS — D735 Infarction of spleen: Secondary | ICD-10-CM | POA: Diagnosis not present

## 2021-06-04 NOTE — Patient Instructions (Signed)
I have ordered another image for your brain. ? ?Let us know if you dont hear back within a week in regards to an appointment being scheduled.  ? ? ? ?

## 2021-06-04 NOTE — Assessment & Plan Note (Signed)
H/o Splenic artery aneurysm, dissection status post stent placement.  He is compliant with aspirin, Plavix and Lipitor.  He follows with vascular, with appointment with Dr Lucky Cowboy 07/2021 ?

## 2021-06-04 NOTE — Assessment & Plan Note (Signed)
Chronic, unchanged with appears slightly improved. Question whether improvement is related to discontinuation of toprol; urology changed to from flomax to silodosin. ?Re-reviewed notes from ENT, Dr. Richardson Landry as well today.  Completing his work-up has been MRI brain, Holter monitor.  He is also pending ultrasound carotids.  With the pulsatile tinnitus that he described to me today, I also ordered an MRA brain.  will follow closely. ?

## 2021-06-04 NOTE — Assessment & Plan Note (Signed)
Lab Results  ?Component Value Date  ? HGBA1C 7.8 (H) 05/09/2021  ? ?Continue Lantus 13 units qd, Insulin lispro 4 units twice daily with meals, ozempic '2mg'$ .  Blood sugars doing appear to be improved.  Encouraged discretion with soda.  Follow-up in 10 weeks when his next A1c is due ?

## 2021-06-04 NOTE — Progress Notes (Signed)
? ?Subjective:  ? ? Patient ID: Benjamin Conley, male    DOB: Jun 28, 1965, 56 y.o.   MRN: [id number] ? ?CC: Benjamin Conley is a 56 y.o. male who presents today for follow up.  ? ?HPI: Feels well today. No new  concerns.  ? ?He continued to feel lightheaded when going from sitting to standing. Episodes are less frequent.  ?  ?Pending US carotid ?Holter monitor  10/30/20 ? ?No syncope, ear pain, HA.  ? ?He has chronic bilateral hearing loss with pulsatile tinnitus. He follows with ENT, last saw Dr Richardson Landry 02/16/21  whom felt most likely symptoms related to orthostatic hypotension.  ? ?Drinking plenty of water.  ? ? ? ?Urology changed to from flomax to silodosin.  ? ? DM- He endorses drinking soda again. FBG 131 ?Post prandial 180. Average glucose 160.  ?A1c 3 weeks ago 7.8 .  Compliant with Lantus 13 units qd increased from 9 units.  Insulin lispro 4 units twice daily with meals, ozempic 65m. No hypoglycemic episodes.  ? ?IDA, thrombocytosis-he is following Dr AFletcher Anon hematology/oncology.  He is compliant with Venofer ? ? ?Dr. ECharlestine Night cardiology.  He is no longer on Toprol.  He is compliant with digoxin for inappropriate sinus tachycardia.  Splenic artery aneurysm, dissection status post stent placement.  He is compliant with aspirin, Plavix and Lipitor.  He follows with vascular, with appointment with Dr DLucky Cowboy5/2023 ? ?He is up-to-date with CT chest lung cancer screening.  Following with pulmonary, SEric Form? ?HISTORY:  ?Past Medical History:  ?Diagnosis Date  ? Acute posthemorrhagic anemia   ? Complication of anesthesia   ? c/o difficulty breathing after anesthesia  ? COPD (chronic obstructive pulmonary disease) ([place]   ? Diabetes mellitus without complication ([place]   ? Hyperlipemia   ? Melena 08/21/2019  ? Paroxysmal supraventricular tachycardia ([place] 08/19/2019  ? Postural dizziness with presyncope 08/21/2019  ? 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 Tobacco abuse   ? Upper GI bleed 08/21/2019  ? ?Past Surgical History:   ?Procedure Laterality Date  ? BACK SURGERY    ? lumbar  ? COLONOSCOPY WITH PROPOFOL N/A 08/24/2019  ? Procedure: COLONOSCOPY WITH PROPOFOL;  Surgeon: WLucilla Lame MD;  [facility];  Service: Endoscopy;  Laterality: N/A;  ? ESOPHAGOGASTRODUODENOSCOPY (EGD) WITH PROPOFOL N/A 08/24/2019  ? Procedure: ESOPHAGOGASTRODUODENOSCOPY (EGD) WITH PROPOFOL;  Surgeon: WLucilla Lame MD;  [facility];  Service: Endoscopy;  Laterality: N/A;  ? STENT PLACEMENT VASCULAR ([place])  07/2019  ? stenosis of distal splenic artery and stent placed  ? VISCERAL ANGIOGRAPHY N/A 08/06/2019  ? Procedure: VISCERAL ANGIOGRAPHY;  Surgeon: SKatha Cabal MD;  [facility];  Service: Cardiovascular;  Laterality: N/A;  ? ?Family History  ?Problem Relation Age of Onset  ? Diabetes Mother   ? Diabetes Mellitus II Mother   ? Tuberculosis Father   ? Heart disease Father   ? Diabetes Sister   ? Diabetes Sister   ? Diabetes Sister   ? Diabetes Sister   ? Diabetes Brother   ? Diabetes Mellitus II Brother   ? Hyperlipidemia Brother   ? Cancer Maternal Aunt   ? Colon cancer Maternal Uncle   ? Prostate cancer Neg Hx   ? ? ?Allergies: Wasp venom, Wasp venom protein, Coffee flavor, and Onion ?Current Outpatient Medications on File Prior to Visit  ?Medication Sig Dispense Refill  ? albuterol (VENTOLIN HFA) 108 (90 Base) MCG/ACT inhaler Inhale 2 puffs into the lungs every  6 (six) hours as needed for wheezing or shortness of breath. 6.7 g 3  ? aspirin 81 MG EC tablet Take 1 tablet (81 mg total) by mouth daily. 90 tablet 3  ? atorvastatin (LIPITOR) 40 MG tablet Take 1 tablet (40 mg total) by mouth daily. 90 tablet 3  ? blood glucose meter kit and supplies Dispense based on patient and insurance preference. Use up to four times daily as directed. (FOR ICD-10 E10.9, E11.9). 1 each 0  ? Cholecalciferol (VITAMIN D) 125 MCG (5000 UT) CAPS Take 1 capsule by mouth daily.    ? clopidogrel (PLAVIX) 75 MG tablet Take 1 tablet (75  mg total) by mouth daily. 90 tablet 3  ? digoxin (LANOXIN) 0.125 MG tablet Take 1 tablet (0.125 mg total) by mouth daily. 30 tablet 3  ? insulin glargine (LANTUS) 100 UNIT/ML Solostar Pen Inject 9 Units into the skin at bedtime. 15 mL 0  ? insulin lispro (HUMALOG KWIKPEN) 100 UNIT/ML KwikPen Inject 4 Units into the skin 2 (two) times daily with a meal. Lunch and dinner 15 mL 11  ? Insulin Pen Needle 32G X 4 MM MISC Use as directed to administer insulin 100 each 2  ? nystatin-triamcinolone ointment (MYCOLOG) Apply 1 application topically 2 (two) times daily. 60 g 0  ? pantoprazole (PROTONIX) 20 MG tablet TAKE 1 TABLET BY MOUTH ONCE DAILY IN THE MORNING 60  MINUTES  BEFORE  BREAKFAST 90 tablet 0  ? Semaglutide, 2 MG/DOSE, 8 MG/3ML SOPN Inject 2 mg as directed once a week. 9 mL 1  ? sildenafil (VIAGRA) 100 MG tablet Take 1 tablet (100 mg total) by mouth daily as needed for erectile dysfunction. 30 tablet 0  ? silodosin (RAPAFLO) 8 MG CAPS capsule Take 1 capsule (8 mg total) by mouth daily. 90 capsule 0  ? triamcinolone cream (KENALOG) 0.1 % Apply 1 application topically 2 (two) times daily. 30 g 0  ? ?No current facility-administered medications on file prior to visit.  ? ? ?Social History  ? ?Tobacco Use  ? Smoking status: Former  ?  Packs/day: 1.00  ?  Years: 48.00  ?  Pack years: 48.00  ?  Types: Cigarettes  ?  Quit date: 02/12/2020  ?  Years since quitting: 1.3  ? Smokeless tobacco: Never  ?Vaping Use  ? Vaping Use: Never used  ?Substance Use Topics  ? Alcohol use: No  ?  Comment: Never been a problem  ? Drug use: No  ? ? ?Review of Systems  ?Constitutional:  Negative for chills and fever.  ?HENT:  Positive for hearing loss (bilateral , chronic).   ?Eyes:  Negative for visual disturbance.  ?Respiratory:  Negative for cough.   ?Cardiovascular:  Negative for chest pain and palpitations.  ?Gastrointestinal:  Negative for nausea and vomiting.  ?Neurological:  Positive for light-headedness.  ?   ?Objective:  ?  ?BP  128/76 (BP Location: Left Arm, Patient Position: Sitting, Cuff Size: Normal)   Pulse (!) 117   Temp 97.6 ?F (36.4 ?C) (Oral)   Ht _0  (1.626 m)   Wt 186 lb (84.4 kg)   SpO2 99%   BMI 31.93 kg/m?  ?BP Readings from Last 3 Encounters:  ?06/04/21 128/76  ?05/08/21 134/86  ?05/07/21 132/89  ? ?Wt Readings from Last 3 Encounters:  ?06/04/21 186 lb (84.4 kg)  ?05/08/21 186 lb 9.6 oz (84.6 kg)  ?05/07/21 187 lb 12.8 oz (85.2 kg)  ? ? ?Physical Exam ?Vitals reviewed.  ?Constitutional:   ?  Appearance: He is well-developed.  ?Cardiovascular:  ?   Rate and Rhythm: Regular rhythm.  ?   Heart sounds: Normal heart sounds.  ?Pulmonary:  ?   Effort: Pulmonary effort is normal. No respiratory distress.  ?   Breath sounds: Normal breath sounds. No wheezing, rhonchi or rales.  ?Skin: ?   General: Skin is warm and dry.  ?Neurological:  ?   Mental Status: He is alert.  ?Psychiatric:     ?   Speech: Speech normal.     ?   Behavior: Behavior normal.  ? ? ?   ?Assessment & Plan:  ? ?Problem List Items Addressed This Visit   ? ?  ? Cardiovascular and Mediastinum  ? Orthostatic hypotension - Primary  ?  Chronic, unchanged with appears slightly improved. Question whether improvement is related to discontinuation of toprol; urology changed to from flomax to silodosin. ?Re-reviewed notes from ENT, Dr. Richardson Landry as well today.  Completing his work-up has been MRI brain, Holter monitor.  He is also pending ultrasound carotids.  With the pulsatile tinnitus that he described to me today, I also ordered an MRA brain.  will follow closely. ?  ?  ? Relevant Orders  ? MR ANGIO HEAD WO CONTRAST  ?  ? Endocrine  ? Diabetes mellitus without complication ([place])  ?  Lab Results  ?Component Value Date  ? HGBA1C 7.8 (H) 05/09/2021  ?Continue Lantus 13 units qd, Insulin lispro 4 units twice daily with meals, ozempic 51m.  Blood sugars doing appear to be improved.  Encouraged discretion with soda.  Follow-up in 10 weeks when his next A1c is due ?  ?  ?   ? Other  ? Splenic infarct  ?  H/o Splenic artery aneurysm, dissection status post stent placement.  He is compliant with aspirin, Plavix and Lipitor.  He follows with vascular, with appointment with Dr DLucky Cowboy5/2023

## 2021-06-09 ENCOUNTER — Encounter: Payer: Self-pay | Admitting: Family

## 2021-06-11 ENCOUNTER — Ambulatory Visit (INDEPENDENT_AMBULATORY_CARE_PROVIDER_SITE_OTHER): Payer: Medicare Other | Admitting: Family

## 2021-06-11 ENCOUNTER — Telehealth: Payer: Self-pay | Admitting: Family

## 2021-06-11 ENCOUNTER — Encounter: Payer: Self-pay | Admitting: Family

## 2021-06-11 ENCOUNTER — Ambulatory Visit
Admission: RE | Admit: 2021-06-11 | Discharge: 2021-06-11 | Disposition: A | Discharge status: Home / Self Care | Payer: Medicare Other | Source: Ambulatory Visit | Attending: Family | Admitting: Family

## 2021-06-11 DIAGNOSIS — R0981 Nasal congestion: Secondary | ICD-10-CM | POA: Diagnosis not present

## 2021-06-11 DIAGNOSIS — J323 Chronic sphenoidal sinusitis: Secondary | ICD-10-CM | POA: Diagnosis not present

## 2021-06-11 DIAGNOSIS — R42 Dizziness and giddiness: Secondary | ICD-10-CM | POA: Insufficient documentation

## 2021-06-11 DIAGNOSIS — J320 Chronic maxillary sinusitis: Secondary | ICD-10-CM | POA: Diagnosis not present

## 2021-06-11 DIAGNOSIS — E1165 Type 2 diabetes mellitus with hyperglycemia: Secondary | ICD-10-CM | POA: Diagnosis not present

## 2021-06-11 DIAGNOSIS — J0100 Acute maxillary sinusitis, unspecified: Secondary | ICD-10-CM

## 2021-06-11 MED ORDER — FLUTICASONE PROPIONATE 50 MCG/ACT NA SUSP: 2.0000 | Freq: Every day | NASAL | 6 refills | Status: DC

## 2021-06-11 MED ORDER — MIDODRINE HCL 5 MG PO TABS: 5.0000 mg | ORAL_TABLET | Freq: Three times a day (TID) | ORAL | 1 refills | Status: DC

## 2021-06-11 MED ORDER — AMOXICILLIN-POT CLAVULANATE 875-125 MG PO TABS: 1.0000 | ORAL_TABLET | Freq: Two times a day (BID) | ORAL | 0 refills | Status: AC

## 2021-06-11 NOTE — Progress Notes (Signed)
? ?Subjective:  ? ? Patient ID: Benjamin Conley, male    DOB: [birth year], 56 y.o.   MRN: [id number] ? ?CC: Benjamin Conley is a 56 y.o. male who presents today for an acute visit.   ? ?HPI: Acute visit for dizziness, which was worse then previous episodes 3 days ago. Since improved.  ?Onset correlates with sinus congestion x 4 days. He is blowing his nose with white to yellow thick discharge. Coughing up phlegm x 4 days. He used water hose in shower to clean out nose.  ?Dizziness starting in the morning and lasted all day which was concerning to him.  ? ?He was walking around at [employer] and mowing grass and he felt  dizzy ? ?Dizziness associated with HA, HA since resolved. He feels better today. No fever, ear pain, vertigo.  ? ?Vision was blurry for 3 seconds  while dizzy and then would resolve.  ? ? ? ? ? ?Normal echocardiogram 09/28/2019 ? ? ?HISTORY:  ?Past Medical History:  ?Diagnosis Date  ? Acute posthemorrhagic anemia   ? Complication of anesthesia   ? c/o difficulty breathing after anesthesia  ? COPD (chronic obstructive pulmonary disease) ([place])   ? Diabetes mellitus without complication ([place])   ? Hyperlipemia   ? Melena 08/21/2019  ? Paroxysmal supraventricular tachycardia ([place]) 08/19/2019  ? Postural dizziness with presyncope 08/21/2019  ? 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 Tobacco abuse   ? Upper GI bleed 08/21/2019  ? ?Past Surgical History:  ?Procedure Laterality Date  ? BACK SURGERY    ? lumbar  ? COLONOSCOPY WITH PROPOFOL N/A 08/24/2019  ? Procedure: COLONOSCOPY WITH PROPOFOL;  Surgeon: [name], MD;  [facility];  Service: Endoscopy;  Laterality: N/A;  ? ESOPHAGOGASTRODUODENOSCOPY (EGD) WITH PROPOFOL N/A 08/24/2019  ? Procedure: ESOPHAGOGASTRODUODENOSCOPY (EGD) WITH PROPOFOL;  Surgeon: [name], MD;  Location: [facility] ENDOSCOPY;  Service: Endoscopy;  Laterality: N/A;  ? STENT PLACEMENT VASCULAR ([place] HX)  07/2019  ? stenosis of distal splenic artery and stent placed  ? VISCERAL ANGIOGRAPHY  N/A 08/06/2019  ? Procedure: VISCERAL ANGIOGRAPHY;  Surgeon: [name], MD;  Location: [place] [facility] LAB;  Service: Cardiovascular;  Laterality: N/A;  ? ?Family History  ?Problem Relation Age of Onset  ? Diabetes Mother   ? Diabetes Mellitus II Mother   ? Tuberculosis Father   ? Heart disease Father   ? Diabetes Sister   ? Diabetes Sister   ? Diabetes Sister   ? Diabetes Sister   ? Diabetes Brother   ? Diabetes Mellitus II Brother   ? Hyperlipidemia Brother   ? Cancer Maternal Aunt   ? Colon cancer Maternal Uncle   ? Prostate cancer Neg Hx   ? ? ?Allergies: Wasp venom, Wasp venom protein, Coffee flavor, and Onion ?Current Outpatient Medications on File Prior to Visit  ?Medication Sig Dispense Refill  ? albuterol (VENTOLIN HFA) 108 (90 Base) MCG/ACT inhaler Inhale 2 puffs into the lungs every 6 (six) hours as needed for wheezing or shortness of breath. 6.7 g 3  ? aspirin 81 MG EC tablet Take 1 tablet (81 mg total) by mouth daily. 90 tablet 3  ? atorvastatin (LIPITOR) 40 MG tablet Take 1 tablet (40 mg total) by mouth daily. 90 tablet 3  ? blood glucose meter Benjamin and supplies Dispense based on patient and insurance preference. Use up to four times daily as directed. (FOR ICD-10 E10.9, E11.9). 1 each 0  ? Cholecalciferol (VITAMIN D) 125 MCG (5000 UT) CAPS  Take 1 capsule by mouth daily.    ? clopidogrel (PLAVIX) 75 MG tablet Take 1 tablet (75 mg total) by mouth daily. 90 tablet 3  ? digoxin (LANOXIN) 0.125 MG tablet Take 1 tablet (0.125 mg total) by mouth daily. 30 tablet 3  ? insulin glargine (LANTUS) 100 UNIT/ML Solostar Pen Inject 9 Units into the skin at bedtime. 15 mL 0  ? insulin lispro (HUMALOG KWIKPEN) 100 UNIT/ML KwikPen Inject 4 Units into the skin 2 (two) times daily with a meal. Lunch and dinner 15 mL 11  ? Insulin Pen Needle 32G X 4 MM MISC Use as directed to administer insulin 100 each 2  ? nystatin-triamcinolone ointment (MYCOLOG) Apply 1 application topically 2 (two) times daily. 60 g 0  ?  pantoprazole (PROTONIX) 20 MG tablet TAKE 1 TABLET BY MOUTH ONCE DAILY IN THE MORNING 60  MINUTES  BEFORE  BREAKFAST 90 tablet 0  ? Semaglutide, 2 MG/DOSE, 8 MG/3ML SOPN Inject 2 mg as directed once a week. 9 mL 1  ? sildenafil (VIAGRA) 100 MG tablet Take 1 tablet (100 mg total) by mouth daily as needed for erectile dysfunction. 30 tablet 0  ? silodosin (RAPAFLO) 8 MG CAPS capsule Take 1 capsule (8 mg total) by mouth daily. 90 capsule 0  ? triamcinolone cream (KENALOG) 0.1 % Apply 1 application topically 2 (two) times daily. 30 g 0  ? ?No current facility-administered medications on file prior to visit.  ? ? ?Social History  ? ?Tobacco Use  ? Smoking status: Former  ?  Packs/day: 1.00  ?  Years: 48.00  ?  Pack years: 48.00  ?  Types: Cigarettes  ?  Quit date: 02/12/2020  ?  Years since quitting: 1.3  ? Smokeless tobacco: Never  ?Vaping Use  ? Vaping Use: Never used  ?Substance Use Topics  ? Alcohol use: No  ?  Comment: Never been a problem  ? Drug use: No  ? ? ?Review of Systems  ?Constitutional:  Negative for chills and fever.  ?Eyes:  Negative for visual disturbance (resolved).  ?Respiratory:  Negative for cough.   ?Cardiovascular:  Negative for chest pain and palpitations.  ?Gastrointestinal:  Negative for nausea and vomiting.  ?Neurological:  Positive for dizziness. Negative for headaches (resolved).  ?   ?Objective:  ?  ?BP 108/66 (BP Location: Left Arm, Patient Position: Sitting, Cuff Size: Large)   Pulse (!) 107   Temp (!) 97.2 ?F (36.2 ?C) (Oral)   Ht 5' 4"  (1.626 m)   Wt 186 lb 6.4 oz (84.6 kg)   SpO2 99%   BMI 32.00 kg/m?  ? ? ?Physical Exam ?Vitals reviewed.  ?Constitutional:   ?   Appearance: He is well-developed.  ?HENT:  ?   Head: Normocephalic and atraumatic.  ?   Right Ear: Hearing, tympanic membrane, ear canal and external ear normal. No decreased hearing noted. No drainage, swelling or tenderness. No middle ear effusion. Tympanic membrane is not injected, erythematous or bulging.  ?   Left  Ear: Hearing, tympanic membrane, ear canal and external ear normal. No decreased hearing noted. No drainage, swelling or tenderness.  No middle ear effusion. Tympanic membrane is not injected, erythematous or bulging.  ?   Nose: Nose normal.  ?   Right Sinus: No maxillary sinus tenderness or frontal sinus tenderness.  ?   Left Sinus: No maxillary sinus tenderness or frontal sinus tenderness.  ?   Mouth/Throat:  ?   Pharynx: Uvula midline. No oropharyngeal exudate  or posterior oropharyngeal erythema.  ?   Tonsils: No tonsillar abscesses.  ?Eyes:  ?   General: Lids are normal. Lids are everted, no foreign bodies appreciated.  ?   Conjunctiva/sclera: Conjunctivae normal.  ?   Pupils: Pupils are equal, round, and reactive to light.  ?   Comments: Normal fundus bilaterally   ?Cardiovascular:  ?   Rate and Rhythm: Regular rhythm.  ?   Heart sounds: Normal heart sounds.  ?Pulmonary:  ?   Effort: Pulmonary effort is normal. No respiratory distress.  ?   Breath sounds: Normal breath sounds. No wheezing, rhonchi or rales.  ?Lymphadenopathy:  ?   Head:  ?   Right side of head: No submental, submandibular, tonsillar, preauricular, posterior auricular or occipital adenopathy.  ?   Left side of head: No submental, submandibular, tonsillar, preauricular, posterior auricular or occipital adenopathy.  ?   Cervical: No cervical adenopathy.  ?Skin: ?   General: Skin is warm and dry.  ?Neurological:  ?   Mental Status: He is alert.  ?   Cranial Nerves: No cranial nerve deficit.  ?   Sensory: No sensory deficit.  ?   Deep Tendon Reflexes:  ?   Reflex Scores: ?     Bicep reflexes are 2+ on the right side and 2+ on the left side. ?     Patellar reflexes are 2+ on the right side and 2+ on the left side. ?   Comments: Grip equal and strong bilateral upper extremities. Gait strong and steady. Able to perform  finger-to-nose without difficulty.   ?Psychiatric:     ?   Speech: Speech normal.     ?   Behavior: Behavior normal.  ? ? ?    ?Assessment & Plan:  ? ?Problem List Items Addressed This Visit   ? ?  ? Respiratory  ? Sinus congestion  ? Relevant Medications  ? fluticasone (FLONASE) 50 MCG/ACT nasal spray  ?  ? Other  ? Dizziness  ?  Negative COVID

## 2021-06-11 NOTE — Assessment & Plan Note (Addendum)
Negative COVID and flu  ? ?profound orthostatic hypotension today ? ?Laying 126/70, HR 105 ?Sitting 108/66, HR 107 ?Standing 88/64 HR 118 ? ?Urology also changed from Flomax to Silodosin although no apparent improvement of dizziness.  ? ?Pending MRA brain, scheduled 06/25/21.  We agreed with abrupt nature of dizziness over the weekend associated with headache, CT head warranted to exclude more worrisome diagnosis such as CVA.  I do however question if partially related to sinus congestion based on onset of symptom. Start flonase.  I have sent a staff message Dr. Charlestine Night in regards to consideration for midodrine.  Advised patient to liberalize salt at this time be very conscientious with position changes.  Close follow up.  ?

## 2021-06-11 NOTE — Patient Instructions (Addendum)
Please increase salt in your diet in order to raise your blood pressure.  I also sent a note to cardiology, Dr. Charlestine Night about starting medication called midodrine ? ?Please start Flonase I think congestion is contributed to exacerbation of dizziness.  ? ?If headache were to recur certainly with the severity that you had over the weekend, please let me know right away as I would recommend urgent re evaluation ? ? ? ?

## 2021-06-11 NOTE — Telephone Encounter (Signed)
? ?  Called pt  ? ?Consulted with dr Charlestine Night whom advised midodrine '5mg'$  TID ? ?Discussed Ct head which was negative for acute torsion, mass.  It showed right sinusitis. ? ?He states no facial pain on right side. He has noticed however that he cant breath out of right nostril due to congestion ? ?No abx in last 3 months ? ?Plan:  ? ?Augmentin ?Midodrine '5mg'$  TID ?Probiotics  ?F/u with me 07/06/21 ?F/u Dr Charlestine Night 06/29/21 ? ?

## 2021-06-13 ENCOUNTER — Telehealth (INDEPENDENT_AMBULATORY_CARE_PROVIDER_SITE_OTHER): Payer: Self-pay | Admitting: Vascular Surgery

## 2021-06-13 NOTE — Telephone Encounter (Signed)
Called for clearance to stop Plavix and Asprin 5-7 days prior to surgery.  Heart Doctor stated she needed to contact Dr. Lucky Cowboy to make this decision.  Please advise. ?

## 2021-06-14 ENCOUNTER — Ambulatory Visit (INDEPENDENT_AMBULATORY_CARE_PROVIDER_SITE_OTHER): Payer: Medicare Other | Admitting: Pulmonary Disease

## 2021-06-14 ENCOUNTER — Encounter: Payer: Self-pay | Admitting: Pulmonary Disease

## 2021-06-14 VITALS — BP 106/70 | HR 100 | Temp 97.7°F | Ht 64.0 in | Wt 185.6 lb

## 2021-06-14 DIAGNOSIS — L853 Xerosis cutis: Secondary | ICD-10-CM | POA: Diagnosis not present

## 2021-06-14 DIAGNOSIS — L300 Nummular dermatitis: Secondary | ICD-10-CM | POA: Diagnosis not present

## 2021-06-14 DIAGNOSIS — Z87891 Personal history of nicotine dependence: Secondary | ICD-10-CM | POA: Diagnosis not present

## 2021-06-14 DIAGNOSIS — J432 Centrilobular emphysema: Secondary | ICD-10-CM

## 2021-06-14 NOTE — Patient Instructions (Signed)
We will breathing tests ordered to check on the severity of your emphysema. ? ?We will see you in follow-up in 4 months time. ?

## 2021-06-14 NOTE — Progress Notes (Signed)
? ?Subjective:  ? ? Patient ID: Benjamin Conley, male    DOB: [birth year], 56 y.o.   MRN: [id number] ?Patient Care Team: ?Burnard Hawthorne, [name] as PCP - General (Family Medicine) ?[name], MD as PCP - Cardiology (Cardiology) ?Lloyd Huger, MD as Consulting Physician (Oncology) ? ?Chief Complaint  ?Patient presents with  ? Follow-up  ?  SOB with exertion and bending and prod cough with yellow sputum. Currently being tx with Augmentin for sinus infection.   ? ?HPI ?Patient is a 56 year old former smoker (48-pack-year history) who presents for follow-up on the issue of emphysema and shortness of breath.  He was last seen here 04 November 2019 was lost to follow-up.  States however that he has been doing well with regards to his breathing.  At his prior visit he was tried on Stiolto 2 puffs daily however he did not find that this was helpful in any way.  He has had chronic dyspnea of longstanding with heavy exertion and bending over.  However this is on change from his baseline.  He states that this does not limit his activity which is already limited due to his need to use a cane for ambulation.  He requires a cane with ambulation due to dizziness and unsteadiness.  He had lung cancer screening program CT on 02 May 2021 this was a lung RADS 2, he will have follow-up CT next February.  He does have emphysema on CT.  He uses albuterol only rarely.  He states he has done much better with his breathing since he quit smoking approximately a year and a half ago.  He does not endorse any other symptomatology today.  He has not had any cough or hemoptysis.  He was recently diagnosed with a sinusitis by CT performed 11 June 2021, he was started on antibiotics and notes that he is improving with these. ? ?Review of Systems ?A 10 point review of systems was performed and it is as noted above otherwise negative. ? ?Past Medical History:  ?Diagnosis Date  ? Acute posthemorrhagic anemia   ? Complication of  anesthesia   ? c/o difficulty breathing after anesthesia  ? COPD (chronic obstructive pulmonary disease) ([place])   ? Diabetes mellitus without complication ([place])   ? Hyperlipemia   ? Melena 08/21/2019  ? Paroxysmal supraventricular tachycardia ([place]) 08/19/2019  ? Postural dizziness with presyncope 08/21/2019  ? 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 Tobacco abuse   ? Upper GI bleed 08/21/2019  ? ?Past Surgical History:  ?Procedure Laterality Date  ? BACK SURGERY    ? lumbar  ? COLONOSCOPY WITH PROPOFOL N/A 08/24/2019  ? Procedure: COLONOSCOPY WITH PROPOFOL;  Surgeon: [name], MD;  Location: [facility] ENDOSCOPY;  Service: Endoscopy;  Laterality: N/A;  ? ESOPHAGOGASTRODUODENOSCOPY (EGD) WITH PROPOFOL N/A 08/24/2019  ? Procedure: ESOPHAGOGASTRODUODENOSCOPY (EGD) WITH PROPOFOL;  Surgeon: [name], MD;  Location[facility] ENDOSCOPY;  Service: Endoscopy;  Laterality: N/A;  ? STENT PLACEMENT VASCULAR ([place] HX)  07/2019  ? stenosis of distal splenic artery and stent placed  ? VISCERAL ANGIOGRAPHY N/A 08/06/2019  ? Procedure: VISCERAL ANGIOGRAPHY;  Surgeon: [name], MD;  Location: [facility] CV LAB;  Service: Cardiovascular;  Laterality: N/A;  ? ?Family History  ?Problem Relation Age of Onset  ? Diabetes Mother   ? Diabetes Mellitus II Mother   ? Tuberculosis Father   ? Heart disease Father   ? Diabetes Sister   ? Diabetes Sister   ? Diabetes Sister   ?  Diabetes Sister   ? Diabetes Brother   ? Diabetes Mellitus II Brother   ? Hyperlipidemia Brother   ? Cancer Maternal Aunt   ? Colon cancer Maternal Uncle   ? Prostate cancer Neg Hx   ? ?Social History  ? ?Tobacco Use  ? Smoking status: Former  ?  Packs/day: 1.00  ?  Years: 48.00  ?  Pack years: 48.00  ?  Types: Cigarettes  ?  Quit date: 02/12/2020  ?  Years since quitting: 1.3  ? Smokeless tobacco: Never  ?Substance Use Topics  ? Alcohol use: No  ?  Comment: Never been a problem  ? ?Allergies  ?Allergen Reactions  ? Wasp Venom Anaphylaxis  ? Wasp Venom Protein Anaphylaxis  ?  Coffee Flavor Nausea And Vomiting  ?  Patient states coffee gives him nausea and stomach cramps  ? Onion Nausea And Vomiting  ? ?Current Meds  ?Medication Sig  ? albuterol (VENTOLIN HFA) 108 (90 Base) MCG/ACT inhaler Inhale 2 puffs into the lungs every 6 (six) hours as needed for wheezing or shortness of breath.  ? amoxicillin-clavulanate (AUGMENTIN) 875-125 MG tablet Take 1 tablet by mouth 2 (two) times daily for 7 days.  ? aspirin 81 MG EC tablet Take 1 tablet (81 mg total) by mouth daily.  ? atorvastatin (LIPITOR) 40 MG tablet Take 1 tablet (40 mg total) by mouth daily.  ? blood glucose meter kit and supplies Dispense based on patient and insurance preference. Use up to four times daily as directed. (FOR ICD-10 E10.9, E11.9).  ? Cholecalciferol (VITAMIN D) 125 MCG (5000 UT) CAPS Take 1 capsule by mouth daily.  ? clopidogrel (PLAVIX) 75 MG tablet Take 1 tablet (75 mg total) by mouth daily.  ? digoxin (LANOXIN) 0.125 MG tablet Take 1 tablet (0.125 mg total) by mouth daily.  ? fluticasone (FLONASE) 50 MCG/ACT nasal spray Place 2 sprays into both nostrils daily.  ? insulin glargine (LANTUS) 100 UNIT/ML Solostar Pen Inject 9 Units into the skin at bedtime.  ? insulin lispro (HUMALOG KWIKPEN) 100 UNIT/ML KwikPen Inject 4 Units into the skin 2 (two) times daily with a meal. Lunch and dinner  ? Insulin Pen Needle 32G X 4 MM MISC Use as directed to administer insulin  ? midodrine (PROAMATINE) 5 MG tablet Take 1 tablet (5 mg total) by mouth 3 (three) times daily with meals.  ? nystatin-triamcinolone ointment (MYCOLOG) Apply 1 application topically 2 (two) times daily.  ? pantoprazole (PROTONIX) 20 MG tablet TAKE 1 TABLET BY MOUTH ONCE DAILY IN THE MORNING 60  MINUTES  BEFORE  BREAKFAST  ? Semaglutide, 2 MG/DOSE, 8 MG/3ML SOPN Inject 2 mg as directed once a week.  ? sildenafil (VIAGRA) 100 MG tablet Take 1 tablet (100 mg total) by mouth daily as needed for erectile dysfunction.  ? silodosin (RAPAFLO) 8 MG CAPS capsule Take  1 capsule (8 mg total) by mouth daily.  ? triamcinolone cream (KENALOG) 0.1 % Apply 1 application topically 2 (two) times daily.  ? ?Immunization History  ?Administered Date(s) Administered  ? Td 03/11/2004  ? Tdap 08/02/2014  ? ? ? ?   ?Objective:  ? Physical Exam ?BP 106/70 (BP Location: Left Arm, Cuff Size: Normal)   Pulse 100   Temp 97.7 ?F (36.5 ?C) (Temporal)   Ht 5' 4"  (1.626 m)   Wt 185 lb 9.6 oz (84.2 kg)   SpO2 97%   BMI 31.86 kg/m?  ?GENERAL: Disheveled appearing gentleman, no acute distress, ambulatory with assistance of  a cane.  He looks much older than stated age.  No conversational dyspnea. ?HEAD: Normocephalic, atraumatic.  ?EYES: Pupils equal, round, reactive to light.  No scleral icterus.  ?MOUTH: Nose/mouth/throat not examined due to masking requirements for COVID 19. ?NECK: Supple. No thyromegaly. Trachea midline. No JVD.  No adenopathy. ?PULMONARY: Good air entry bilaterally.  Coarse, otherwise, no adventitious sounds. ?CARDIOVASCULAR: S1 and S2. Regular rate and rhythm.  No rubs, murmurs or gallops heard. ?ABDOMEN: Benign. ?MUSCULOSKELETAL: No joint deformity, no clubbing, no edema.  ?NEUROLOGIC: No overt focal deficit, gait is unsteady and requires cane.  Speech is fluent though he does have some occasional word finding difficulty, states this is chronic. ?SKIN: Intact,warm,dry. ?PSYCH: Mood and behavior normal. ? ?Representative image from the CT performed 02 May 2021 reviewed with the patient: ? ? ?   ?Assessment & Plan:  ? ?  ICD-10-CM   ?1. Centrilobular emphysema ([place])  J43.2 Pulmonary Function Test [facility] Only  ? Will obtain PFTs ?Follow-up 4 months  ?  ?2. Former heavy cigarette smoker (20-39 per day)  Z87.891   ? Quit 2021 ?No evidence of relapse  ?  ? ?Orders Placed This Encounter  ?Procedures  ? Pulmonary Function Test [facility] Only  ?  Standing Status:   Future  ?  Standing Expiration Date:   06/15/2022  ?  Scheduling Instructions:  ?   36mo ?  Order Specific Question:   Full  PFT: includes the following: basic spirometry, spirometry pre & post bronchodilator, diffusion capacity (DLCO), lung volumes  ?  Answer:   Full PFT  ? ?Patient does not appear to have any acute pulmonary issues or co

## 2021-06-21 ENCOUNTER — Telehealth: Payer: Self-pay

## 2021-06-21 NOTE — Chronic Care Management (AMB) (Signed)
?  Chronic Care Management  ? ?Note ? ?06/21/2021 ?Name: Benjamin Conley MRN: [id number] DOB: 23-Jul-1965 ? ?Benjamin Conley is a 56 y.o. year old male who is a primary care patient of Burnard Hawthorne, [name]. Benjamin Conley is currently enrolled in care management services. An additional referral for RNCM  was placed.  ? ?Follow up plan: ?Telephone appointment with care management team member scheduled for:07/18/2021 ? ?Noreene Larsson, RMA ?Care Guide, Embedded Care Coordination ?[facility]  ?[place], [place] [address] ?Direct Dial: [phone] ?Museum/[email or web address] ?Website: [email or web address]  ? ?

## 2021-06-25 ENCOUNTER — Ambulatory Visit
Admission: RE | Admit: 2021-06-25 | Discharge: 2021-06-25 | Disposition: A | Discharge status: Home / Self Care | Payer: Medicare Other | Source: Ambulatory Visit | Attending: Family | Admitting: Family

## 2021-06-25 DIAGNOSIS — R519 Headache, unspecified: Secondary | ICD-10-CM | POA: Insufficient documentation

## 2021-06-25 DIAGNOSIS — R42 Dizziness and giddiness: Secondary | ICD-10-CM | POA: Diagnosis not present

## 2021-06-25 DIAGNOSIS — I6521 Occlusion and stenosis of right carotid artery: Secondary | ICD-10-CM | POA: Diagnosis not present

## 2021-06-25 DIAGNOSIS — I951 Orthostatic hypotension: Secondary | ICD-10-CM | POA: Diagnosis not present

## 2021-06-25 DIAGNOSIS — I6611 Occlusion and stenosis of right anterior cerebral artery: Secondary | ICD-10-CM | POA: Diagnosis not present

## 2021-06-25 DIAGNOSIS — H538 Other visual disturbances: Secondary | ICD-10-CM | POA: Insufficient documentation

## 2021-06-29 ENCOUNTER — Encounter: Payer: Self-pay | Admitting: Cardiology

## 2021-06-29 ENCOUNTER — Ambulatory Visit (INDEPENDENT_AMBULATORY_CARE_PROVIDER_SITE_OTHER): Payer: Medicare Other | Admitting: Cardiology

## 2021-06-29 VITALS — BP 120/66 | HR 99 | Ht 65.0 in | Wt 186.0 lb

## 2021-06-29 DIAGNOSIS — E782 Mixed hyperlipidemia: Secondary | ICD-10-CM

## 2021-06-29 DIAGNOSIS — I951 Orthostatic hypotension: Secondary | ICD-10-CM

## 2021-06-29 DIAGNOSIS — R000 Tachycardia, unspecified: Secondary | ICD-10-CM | POA: Diagnosis not present

## 2021-06-29 DIAGNOSIS — R42 Dizziness and giddiness: Secondary | ICD-10-CM | POA: Diagnosis not present

## 2021-06-29 MED ORDER — MIDODRINE HCL 5 MG PO TABS: 7.5000 mg | ORAL_TABLET | Freq: Three times a day (TID) | ORAL | 1 refills | Status: DC

## 2021-06-29 NOTE — Progress Notes (Signed)
?Cardiology Office Note:   ? ?Date:  06/29/2021  ? ?ID:  Benjamin Conley, DOB 06/25/1965, MRN [id number] ? ?PCP:  Burnard Hawthorne, [name]  ?[facility] Cardiologist:  [name], MD  ?[facility] Electrophysiologist:  None  ? ?Referring MD: Sharmon Leyden*  ? ?Chief Complaint  ?Patient presents with  ? Other  ?  2 month follow up -- Meds reviewed verbally with patient.   ? ? ?History of Present Illness:   ? ?Benjamin Conley is a 56 y.o. male with a hx of diabetes, orthostatic hypotension, inappropriate sinus tach, splenic infarct s/p stent placement, COPD, hyperlipidemia, former smoker x40+ years who presents for follow-up.   ? ?Previously seen due to low blood pressures, orthostasis.  Toprol-XL was stopped with improvement in BP and symptoms.  Heart rates noted to be elevated, digoxin started as patient cannot tolerate beta-blocker.  Recently saw PCP, midodrine was added to help with orthostasis.  He states symptoms are slightly improved since starting midodrine although still present.  Heart rates are also better. ? ?Prior notes ?Echo 04/4823 normal systolic and diastolic function, EF 55 to 60%. ?Lexiscan Myoview 08/2019 low risk, no evidence for ischemia, no significant coronary artery calcification ?Cardiac monitor on May/2022, no significant arrhythmias. ? ?Patient had worsening left upper quadrant abdominal pain.  He presented to the ED  where CT abdomen dated 07/2019 showed distal splenic aneurysm no greater than 6 mm, small dissection, likely source of splenic infarctions.  He was diagnosed with splenic infarct and had a stent placed to the splenic artery.   ? Denies palpitations or irregular heartbeats.  States his heart rates have been elevated over the past 2 to 3 weeks, up to 120 bpm upon checks at home. ? ? ?Past Medical History:  ?Diagnosis Date  ? Acute posthemorrhagic anemia   ? Complication of anesthesia   ? c/o difficulty breathing after anesthesia  ? COPD (chronic  obstructive pulmonary disease) ([place])   ? Diabetes mellitus without complication ([place])   ? Hyperlipemia   ? Melena 08/21/2019  ? Paroxysmal supraventricular tachycardia ([place]) 08/19/2019  ? Postural dizziness with presyncope 08/21/2019  ? 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 Tobacco abuse   ? Upper GI bleed 08/21/2019  ? ? ?Past Surgical History:  ?Procedure Laterality Date  ? BACK SURGERY    ? lumbar  ? COLONOSCOPY WITH PROPOFOL N/A 08/24/2019  ? Procedure: COLONOSCOPY WITH PROPOFOL;  Surgeon: [name], MD;  Location: [facility] ENDOSCOPY;  Service: Endoscopy;  Laterality: N/A;  ? ESOPHAGOGASTRODUODENOSCOPY (EGD) WITH PROPOFOL N/A 08/24/2019  ? Procedure: ESOPHAGOGASTRODUODENOSCOPY (EGD) WITH PROPOFOL;  Surgeon: [name], MD;  Location: [facility] ENDOSCOPY;  Service: Endoscopy;  Laterality: N/A;  ? STENT PLACEMENT VASCULAR ([place] HX)  07/2019  ? stenosis of distal splenic artery and stent placed  ? VISCERAL ANGIOGRAPHY N/A 08/06/2019  ? Procedure: VISCERAL ANGIOGRAPHY;  Surgeon: [name], MD;  Location: [facility] CV LAB;  Service: Cardiovascular;  Laterality: N/A;  ? ? ?Current Medications: ?Current Meds  ?Medication Sig  ? albuterol (VENTOLIN HFA) 108 (90 Base) MCG/ACT inhaler Inhale 2 puffs into the lungs every 6 (six) hours as needed for wheezing or shortness of breath.  ? aspirin 81 MG EC tablet Take 1 tablet (81 mg total) by mouth daily.  ? atorvastatin (LIPITOR) 40 MG tablet Take 1 tablet (40 mg total) by mouth daily.  ? blood glucose meter kit and supplies Dispense based on patient and insurance preference. Use up to four times daily  as directed. (FOR ICD-10 E10.9, E11.9).  ? Cholecalciferol (VITAMIN D) 125 MCG (5000 UT) CAPS Take 1 capsule by mouth daily.  ? clopidogrel (PLAVIX) 75 MG tablet Take 1 tablet (75 mg total) by mouth daily.  ? digoxin (LANOXIN) 0.125 MG tablet Take 1 tablet (0.125 mg total) by mouth daily.  ? fluticasone (FLONASE) 50 MCG/ACT nasal spray Place 2 sprays into both nostrils daily.  ?  insulin glargine (LANTUS) 100 UNIT/ML Solostar Pen Inject 9 Units into the skin at bedtime.  ? insulin lispro (HUMALOG KWIKPEN) 100 UNIT/ML KwikPen Inject 4 Units into the skin 2 (two) times daily with a meal. Lunch and dinner  ? Insulin Pen Needle 32G X 4 MM MISC Use as directed to administer insulin  ? nystatin-triamcinolone ointment (MYCOLOG) Apply 1 application topically 2 (two) times daily.  ? pantoprazole (PROTONIX) 20 MG tablet TAKE 1 TABLET BY MOUTH ONCE DAILY IN THE MORNING 60  MINUTES  BEFORE  BREAKFAST  ? Semaglutide, 2 MG/DOSE, 8 MG/3ML SOPN Inject 2 mg as directed once a week.  ? sildenafil (VIAGRA) 100 MG tablet Take 1 tablet (100 mg total) by mouth daily as needed for erectile dysfunction.  ? silodosin (RAPAFLO) 8 MG CAPS capsule Take 1 capsule (8 mg total) by mouth daily.  ? triamcinolone cream (KENALOG) 0.1 % Apply 1 application topically 2 (two) times daily.  ? [DISCONTINUED] midodrine (PROAMATINE) 5 MG tablet Take 1 tablet (5 mg total) by mouth 3 (three) times daily with meals.  ?  ? ?Allergies:   Wasp venom, Wasp venom protein, Coffee flavor, and Onion  ? ?Social History  ? ?Socioeconomic History  ? Marital status: Single  ?  Spouse name: Not on file  ? Number of children: Not on file  ? Years of education: Not on file  ? Highest education level: Not on file  ?Occupational History  ? Not on file  ?Tobacco Use  ? Smoking status: Former  ?  Packs/day: 1.00  ?  Years: 48.00  ?  Pack years: 48.00  ?  Types: Cigarettes  ?  Quit date: 02/12/2020  ?  Years since quitting: 1.3  ? Smokeless tobacco: Never  ?Vaping Use  ? Vaping Use: Never used  ?Substance and Sexual Activity  ? Alcohol use: No  ?  Comment: Never been a problem  ? Drug use: No  ? Sexual activity: Yes  ?Other Topics Concern  ? Not on file  ?Social History Narrative  ? Single, lives alone, unemployed.  ? ?Social Determinants of Health  ? ?Financial Resource Strain: Low Risk   ? Difficulty of Paying Living Expenses: Not hard at all  ?Food  Insecurity: No Food Insecurity  ? Worried About Charity fundraiser in the Last Year: Never true  ? Ran Out of Food in the Last Year: Never true  ?Transportation Needs: No Transportation Needs  ? Lack of Transportation (Medical): No  ? Lack of Transportation (Non-Medical): No  ?Physical Activity: Insufficiently Active  ? Days of Exercise per Week: 4 days  ? Minutes of Exercise per Session: 20 min  ?Stress: No Stress Concern Present  ? Feeling of Stress : Not at all  ?Social Connections: Unknown  ? Frequency of Communication with Friends and Family: Not on file  ? Frequency of Social Gatherings with Friends and Family: More than three times a week  ? Attends Religious Services: Not on file  ? Active Member of Clubs or Organizations: No  ? Attends Archivist Meetings:  Not on file  ? Marital Status: Not on file  ?  ? ?Family History: ?The patient's family history includes Cancer in his maternal aunt; Colon cancer in his maternal uncle; Diabetes in his brother, mother, sister, sister, sister, and sister; Diabetes Mellitus II in his brother and mother; Heart disease in his father; Hyperlipidemia in his brother; Tuberculosis in his father. There is no history of Prostate cancer. ? ?ROS:   ?Please see the history of present illness.    ? All other systems reviewed and are negative. ? ?EKGs/Labs/Other Studies Reviewed:   ? ?The following studies were reviewed today: ? ? ?EKG:  EKG not ordered today.   ? ?Recent Labs: ?02/23/2021: Magnesium 2.1; TSH 1.370 ?03/08/2021: ALT 18; Pro B Natriuretic peptide (BNP) 34.0 ?05/07/2021: BUN 13; Creatinine, Ser 0.63; Hemoglobin 12.1; Platelets 443; Potassium 4.0; Sodium 137  ?Recent Lipid Panel ?   ?Component Value Date/Time  ? CHOL 138 02/23/2021 1531  ? TRIG 126 02/23/2021 1531  ? HDL 41 02/23/2021 1531  ? CHOLHDL 3.4 02/23/2021 1531  ? CHOLHDL 2.6 01/21/2020 1505  ? VLDL 28.4 10/21/2019 1432  ? [place] 74 02/23/2021 1531  ? LDLCALC 40 01/21/2020 1505  ? ? ?Physical Exam:    ? ?VS:  BP 120/66 (BP Location: Left Arm, Patient Position: Sitting, Cuff Size: Normal)   Pulse 99   Ht 5' 5"  (1.651 m)   Wt 186 lb (84.4 kg)   SpO2 98%   BMI 30.95 kg/m?    ? ?Wt Readings from Last 3 Encounters:  ?04/21/

## 2021-06-29 NOTE — Patient Instructions (Addendum)
Medication Instructions:  ? ?Your physician has recommended you make the following change in your medication:  ? ? INCREASE your Midodrine to 7.5 MG three times a day. ? ? ?*If you need a refill on your cardiac medications before your next appointment, please call your pharmacy* ? ? ?Lab Work: ?None ordered ?If you have labs (blood work) drawn today and your tests are completely normal, you will receive your results only by: ?MyChart Message (if you have MyChart) OR ?A paper copy in the mail ?If you have any lab test that is abnormal or we need to change your treatment, we will call you to review the results. ? ? ?Testing/Procedures: ?None ordered ? ? ?Follow-Up: ?At [facility], you and your health needs are our priority.  As part of our continuing mission to provide you with exceptional heart care, we have created designated Provider Care Teams.  These Care Teams include your primary Cardiologist (physician) and Advanced Practice Providers (APPs -  Physician Assistants and Nurse Practitioners) who all work together to provide you with the care you need, when you need it. ? ?We recommend signing up for the patient portal called "MyChart".  Sign up information is provided on this After Visit Summary.  MyChart is used to connect with patients for Virtual Visits (Telemedicine).  Patients are able to view lab/test results, encounter notes, upcoming appointments, etc.  Non-urgent messages can be sent to your provider as well.   ?To learn more about what you can do with MyChart, go to [email or web address].   ? ?Your next appointment:   ?2 month(s) ? ?The format for your next appointment:   ?In Person ? ?Provider:   ?  ? ? ?Other Instructions ? ? ?Important Information About Sugar ? ? ? ? ? ? ?

## 2021-07-02 ENCOUNTER — Telehealth (INDEPENDENT_AMBULATORY_CARE_PROVIDER_SITE_OTHER): Payer: Self-pay

## 2021-07-02 ENCOUNTER — Telehealth: Payer: Self-pay | Admitting: *Deleted

## 2021-07-02 NOTE — Telephone Encounter (Signed)
A fax was received from [place] regarding this patient stopping his Aspirin and Plavix for 5-7 days for a prostate biopsy. Per Dr. Lucky Cowboy this is okay and low risk. ?

## 2021-07-02 NOTE — Telephone Encounter (Signed)
Patient needs to be scheduled for prostate biopsy with Dr. Bernardo Heater. Per note may stop medication 5 to 7 days' \\prior'$  ?

## 2021-07-02 NOTE — Telephone Encounter (Signed)
Advised patient today I was still waiting on his paper work to be sent back to [place] today . Patient needs to be cleared to stop aspirin and plavix before the prostate biopsy. Per Heart Doctor . Information sent on 06/13/2021 to Dr. Lucky Cowboy  office  ?

## 2021-07-03 ENCOUNTER — Encounter: Payer: Self-pay | Admitting: *Deleted

## 2021-07-03 ENCOUNTER — Other Ambulatory Visit: Payer: Self-pay | Admitting: Family

## 2021-07-03 DIAGNOSIS — R930 Abnormal findings on diagnostic imaging of skull and head, not elsewhere classified: Secondary | ICD-10-CM

## 2021-07-05 ENCOUNTER — Telehealth: Payer: Medicaid Other

## 2021-07-06 ENCOUNTER — Encounter: Payer: Self-pay | Admitting: Family

## 2021-07-06 ENCOUNTER — Telehealth: Payer: Self-pay | Admitting: Family

## 2021-07-06 ENCOUNTER — Ambulatory Visit (INDEPENDENT_AMBULATORY_CARE_PROVIDER_SITE_OTHER): Payer: Medicare Other | Admitting: Family

## 2021-07-06 VITALS — BP 132/88 | HR 99 | Temp 98.7°F | Ht 64.0 in | Wt 188.4 lb

## 2021-07-06 DIAGNOSIS — R42 Dizziness and giddiness: Secondary | ICD-10-CM | POA: Diagnosis not present

## 2021-07-06 DIAGNOSIS — E119 Type 2 diabetes mellitus without complications: Secondary | ICD-10-CM | POA: Diagnosis not present

## 2021-07-06 DIAGNOSIS — I951 Orthostatic hypotension: Secondary | ICD-10-CM | POA: Diagnosis not present

## 2021-07-06 NOTE — Patient Instructions (Signed)
Please keep upcoming US carotid as scheduled. We will work on moving up.  ?I will make a referral to vascular neurosurgery once I have a recommendation from Dr Lucky Cowboy, vascular.  ? ?Please let me know how you are doing and if you have any changes in vision, you must call 911 or go to nearest emergency room. ? ?

## 2021-07-06 NOTE — Telephone Encounter (Signed)
I have faxed OV notes from today.  ?

## 2021-07-06 NOTE — Telephone Encounter (Signed)
Call [facility] whom patient follows with; he doesn't recall name ? ?He had an episode 2 days ago in which he was dizzy and vision became nearly black and could only see shapes for 4 minutes, then resolved. So they are aware, he has had the below.  ? ?MR angio head 06/25/21 which showed severe narrowing of internal carotid artery ? ?US carotid set up for 07/25/21 ? ?I would like for pt to be seen asap to ensure no further eye imaging , DM eye needs to be addressed ? How soon can they see him? ? ? ?

## 2021-07-06 NOTE — Telephone Encounter (Signed)
Amber From [facility] called in stating that someone from our office just called them to schedule pt appt... Amber stated that they need the notes to be faxed over to there office... [facility] Fax number [phone] ?

## 2021-07-06 NOTE — Assessment & Plan Note (Addendum)
Symptoms completely resolved at this time. No loss of vision or syncope at that time.  Reassuring neurologic exam.  ?Appointment has been made with [facility] for Monday May 1st. Referral made based on Dr Nelly Laurence recommendation to Dr Francesca Oman , [place] vascular neurosurgeon. Consulted with Dr Lucky Cowboy, vascular, via staff message  regarding  intracranial carotid stenosis whom felt less likely to cause symptoms. Episode didn't appear to correlate with hypoglycemia. Question dehydration and known history of orthostatic hypotension. We discussed repeat MRI brain however opted to await ophthalmology advice.  Pending US carotid. Close follow up after ophthalmology consult.  ? ?

## 2021-07-06 NOTE — Progress Notes (Signed)
? ?Subjective:  ? ? Patient ID: Benjamin Conley, male    DOB: July 01, 1965, 56 y.o.   MRN: [id number] ? ?CC: Benjamin Conley is a 56 y.o. male who presents today for an acute visit.   ? ?HPI: He complains of episode of feeling dizziness x 2 days ago, since resolved.  ?He had been working welding at dusk. He walked inside his out and then suddenly felt dizzy.  ?No loc or syncope. No eye pain. No loss of vision.  ?He doesn't feel that he had enough water that day  ?Dizziness overall is not happening 'as often or as much'.  ? ?Sinus congestion is only at night. Not bothersome. Compliant with flonase with some relief.  ?He doesn't recall blood sugar readings that day.  ?Symptom associated with posterior headache.His vision was dark. He could see the shapes but everything looked black for approx 4 minutes, then resolved.  Dizziness has resolved  ?No associated confusion, diaphoretic, exertional chest pain or pressure, numbness or tingling radiating to left arm or jaw, palpitations, or shortness of breath.  ? ?Similar episodes of vision changes in the past last one prior 3 weeks ago. However this episode felt more severe.  ? ?DM- he is wearing CGM, FBG 106, 120.  Highest 159 reading and lowest 142 reading in last 7 days. Average 151.  ?He had 69 , three hours after dinner which came up to 114 an hour later. He had 4 tacos for dinner. He is not skipping meals. He reports CGM will 'pop off' and doesn't think 70 was accurate. He didn't feel hypoglycemic at that time.  ? ? ?He is compliant Lantus 13 units qd, Insulin lispro 4 units twice daily with meals, ozempic 29m.  ? ?Seen by Dr ECharlestine Night4/21/23 follow up orthostatic hypotension ?Increased midodrine 7.559mTID  ?Inappropriate sinus tachycardia and he is compliant with digoxin 0.12526maily ? ?MR angio head 06/25/21 ?Ct head 06/11/21  ?US Korearotid set up for 07/25/21 ?Previous US Korearotid appears done in 2021 with DukRiccardo Dubin? ?HISTORY:  ?Past Medical History:  ?Diagnosis  Date  ? Acute posthemorrhagic anemia   ? Complication of anesthesia   ? c/o difficulty breathing after anesthesia  ? COPD (chronic obstructive pulmonary disease) ([place] ? Diabetes mellitus without complication ([place] ? Hyperlipemia   ? Melena 08/21/2019  ? Paroxysmal supraventricular tachycardia ([place]/12/2019  ? Postural dizziness with presyncope 08/21/2019  ? 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 Tobacco abuse   ? Upper GI bleed 08/21/2019  ? ?Past Surgical History:  ?Procedure Laterality Date  ? BACK SURGERY    ? lumbar  ? COLONOSCOPY WITH PROPOFOL N/A 08/24/2019  ? Procedure: COLONOSCOPY WITH PROPOFOL;  Surgeon: WohLucilla LameD;  [facility];  Service: Endoscopy;  Laterality: N/A;  ? ESOPHAGOGASTRODUODENOSCOPY (EGD) WITH PROPOFOL N/A 08/24/2019  ? Procedure: ESOPHAGOGASTRODUODENOSCOPY (EGD) WITH PROPOFOL;  Surgeon: WohLucilla LameD;  [facility];  Service: Endoscopy;  Laterality: N/A;  ? STENT PLACEMENT [place]ASCULAR ([place])  07/2019  ? stenosis of distal splenic artery and stent placed  ? VISCERAL ANGIOGRAPHY N/A 08/06/2019  ? Procedure: VISCERAL ANGIOGRAPHY;  Surgeon: SchKatha CabalD;  [facility];  Service: Cardiovascular;  Laterality: N/A;  ? ?Family History  ?Problem Relation Age of Onset  ? Diabetes Mother   ? Diabetes Mellitus II Mother   ? Tuberculosis Father   ? Heart disease Father   ? Diabetes Sister   ? Diabetes  Sister   ? Diabetes Sister   ? Diabetes Sister   ? Diabetes Brother   ? Diabetes Mellitus II Brother   ? Hyperlipidemia Brother   ? Cancer Maternal Aunt   ? Colon cancer Maternal Uncle   ? Prostate cancer Neg Hx   ? ? ?Allergies: Wasp venom, Wasp venom protein, Coffee flavor, and Onion ?Current Outpatient Medications on File Prior to Visit  ?Medication Sig Dispense Refill  ? albuterol (VENTOLIN HFA) 108 (90 Base) MCG/ACT inhaler Inhale 2 puffs into the lungs every 6 (six) hours as needed for wheezing or shortness of breath. 6.7 g 3  ? aspirin 81 MG EC  tablet Take 1 tablet (81 mg total) by mouth daily. 90 tablet 3  ? atorvastatin (LIPITOR) 40 MG tablet Take 1 tablet (40 mg total) by mouth daily. 90 tablet 3  ? blood glucose meter kit and supplies Dispense based on patient and insurance preference. Use up to four times daily as directed. (FOR ICD-10 E10.9, E11.9). 1 each 0  ? Cholecalciferol (VITAMIN D) 125 MCG (5000 UT) CAPS Take 1 capsule by mouth daily.    ? clopidogrel (PLAVIX) 75 MG tablet Take 1 tablet (75 mg total) by mouth daily. 90 tablet 3  ? digoxin (LANOXIN) 0.125 MG tablet Take 1 tablet (0.125 mg total) by mouth daily. 30 tablet 3  ? fluticasone (FLONASE) 50 MCG/ACT nasal spray Place 2 sprays into both nostrils daily. 16 g 6  ? insulin glargine (LANTUS) 100 UNIT/ML Solostar Pen Inject 9 Units into the skin at bedtime. 15 mL 0  ? insulin lispro (HUMALOG KWIKPEN) 100 UNIT/ML KwikPen Inject 4 Units into the skin 2 (two) times daily with a meal. Lunch and dinner 15 mL 11  ? Insulin Pen Needle 32G X 4 MM MISC Use as directed to administer insulin 100 each 2  ? midodrine (PROAMATINE) 5 MG tablet Take 1.5 tablets (7.5 mg total) by mouth 3 (three) times daily with meals. 135 tablet 1  ? nystatin-triamcinolone ointment (MYCOLOG) Apply 1 application topically 2 (two) times daily. 60 g 0  ? pantoprazole (PROTONIX) 20 MG tablet TAKE 1 TABLET BY MOUTH ONCE DAILY IN THE MORNING 60  MINUTES  BEFORE  BREAKFAST 90 tablet 0  ? Semaglutide, 2 MG/DOSE, 8 MG/3ML SOPN Inject 2 mg as directed once a week. 9 mL 1  ? sildenafil (VIAGRA) 100 MG tablet Take 1 tablet (100 mg total) by mouth daily as needed for erectile dysfunction. 30 tablet 0  ? silodosin (RAPAFLO) 8 MG CAPS capsule Take 1 capsule (8 mg total) by mouth daily. 90 capsule 0  ? triamcinolone cream (KENALOG) 0.1 % Apply 1 application topically 2 (two) times daily. 30 g 0  ? ?No current facility-administered medications on file prior to visit.  ? ? ?Social History  ? ?Tobacco Use  ? Smoking status: Former  ?   Packs/day: 1.00  ?  Years: 48.00  ?  Pack years: 48.00  ?  Types: Cigarettes  ?  Quit date: 02/12/2020  ?  Years since quitting: 1.3  ? Smokeless tobacco: Never  ?Vaping Use  ? Vaping Use: Never used  ?Substance Use Topics  ? Alcohol use: No  ?  Comment: Never been a problem  ? Drug use: No  ? ? ?Review of Systems  ?Constitutional:  Negative for chills and fever.  ?Eyes:  Positive for visual disturbance.  ?Respiratory:  Negative for cough.   ?Cardiovascular:  Negative for chest pain and palpitations.  ?Gastrointestinal:  Negative for nausea and vomiting.  ?Neurological:  Positive for dizziness. Negative for syncope.  ?   ?Objective:  ?  ?BP 132/88 (BP Location: Right Arm, Patient Position: Sitting, Cuff Size: Normal)   Pulse 99   Temp 98.7 ?F (37.1 ?C) (Oral)   Ht 5' 4"  (1.626 m)   Wt 188 lb 6.4 oz (85.5 kg)   SpO2 98%   BMI 32.34 kg/m?  ? ? ?Physical Exam ?Vitals reviewed.  ?Constitutional:   ?   Appearance: He is well-developed.  ?HENT:  ?   Right Ear: Hearing normal.  ?   Left Ear: Hearing normal.  ?   Mouth/Throat:  ?   Pharynx: Uvula midline. No posterior oropharyngeal erythema.  ?Eyes:  ?   General: Lids are normal. Lids are everted, no foreign bodies appreciated.  ?   Conjunctiva/sclera: Conjunctivae normal.  ?   Pupils: Pupils are equal, round, and reactive to light.  ?   Comments: Normal fundus bilaterally.  ?Cardiovascular:  ?   Rate and Rhythm: Regular rhythm.  ?   Heart sounds: Normal heart sounds.  ?Pulmonary:  ?   Effort: Pulmonary effort is normal. No respiratory distress.  ?   Breath sounds: Normal breath sounds. No wheezing, rhonchi or rales.  ?Lymphadenopathy:  ?   Head:  ?   Right side of head: No submental, submandibular, tonsillar, preauricular, posterior auricular or occipital adenopathy.  ?   Left side of head: No submental, submandibular, tonsillar, preauricular, posterior auricular or occipital adenopathy.  ?   Cervical: No cervical adenopathy.  ?Skin: ?   General: Skin is warm and dry.   ?Neurological:  ?   Mental Status: He is alert.  ?   Cranial Nerves: No cranial nerve deficit.  ?   Sensory: No sensory deficit.  ?   Deep Tendon Reflexes:  ?   Reflex Scores: ?     Bicep reflexes are 2+ on the righ

## 2021-07-06 NOTE — Assessment & Plan Note (Signed)
Frequency and severity of episodes appears improved. Continue midodrine 7.'5mg'$  TID  ?

## 2021-07-06 NOTE — Telephone Encounter (Signed)
Spoke to patient about referral and appointment on Mon 07/09/21 @ 1:25 at eye dr. Also instructed him to give [place] a call back if he had not heard from the referral office within a week also. ?

## 2021-07-06 NOTE — Telephone Encounter (Signed)
Patty from [facility] called stating that  Dr. Izora Ribas has reviewed pt chart and he said he needs to see the vascular neurosurgeon in [place]- Dr. Francesca Oman ?Phone [phone] ?Fax [phone] ? ?

## 2021-07-06 NOTE — Assessment & Plan Note (Addendum)
?  Lab Results  ?Component Value Date  ? HGBA1C 7.8 (H) 05/09/2021  ? ?Unable to download CGM log with our interface today.based on average blood glucose, suspect a1c 6.9. Concern vision changes could have been related to hypoglycemia  However unable to pull report to confirm this. Pending ophthalmology consult, close follow up.  ?

## 2021-07-06 NOTE — Progress Notes (Signed)
Patient stated that he had a blackout on Wednesday evening. 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 Then it stopped? ?

## 2021-07-06 NOTE — Telephone Encounter (Signed)
Call  ?Ask about opthalmology appt- did sarah make him one? ? ?Also , I am referring him to vascular neurosurgeon per recommended by dr Cari Caraway ? ?Dr. Francesca Oman ?Phone [phone] ?Fax [phone] ? ? ?Let us know if you dont hear back within a week in regards to an appointment being scheduled.  ? ? ?

## 2021-07-09 DIAGNOSIS — G453 Amaurosis fugax: Secondary | ICD-10-CM | POA: Diagnosis not present

## 2021-07-09 LAB — HM DIABETES EYE EXAM

## 2021-07-10 ENCOUNTER — Encounter: Payer: Self-pay | Admitting: Oncology

## 2021-07-10 ENCOUNTER — Telehealth: Payer: Self-pay | Admitting: Family

## 2021-07-10 NOTE — Telephone Encounter (Signed)
Called pt ? ?Left message ? ?He returned call and I called him back. Feels well today ? ?He was seen by ophthalmology yesterday, Loura Back. Dingeldein, MD ? ? He had dilated eye exam and reports normal.  ?He has no further episode of darkness of visual field. Dizziness is less often. He declines MRI brain at this time.  ? ?Rasheedah- what is status of appt with  Dr. Francesca Oman?  ? ?Domingo Mend, can call [place] and get report from Dr Remo Lipps A. Dingeldein, MD from yesterday 07/09/21? ? ? ?

## 2021-07-10 NOTE — Telephone Encounter (Signed)
Patient called and said he was returning a phone call from [place]. He stated that there was no reason for the call. Checked chart and I don't see a phone call from office for 07/09/2021 ?

## 2021-07-11 ENCOUNTER — Telehealth: Payer: Self-pay | Admitting: Family

## 2021-07-11 ENCOUNTER — Other Ambulatory Visit: Payer: Self-pay

## 2021-07-11 MED ORDER — FREESTYLE LIBRE 2 SENSOR MISC
1.0000 | 4 refills | Status: DC
Start: 2021-07-11 — End: 2021-11-27

## 2021-07-11 NOTE — Telephone Encounter (Signed)
Spoke to patient and informed him of refill  for his Freestyle Elenor Legato has been sent in to [facility] on [address] ?

## 2021-07-11 NOTE — Telephone Encounter (Signed)
Patient would like a prescription for his Elenor Legato to sent to [place], [place].  ?

## 2021-07-11 NOTE — Telephone Encounter (Signed)
Refill sent.

## 2021-07-11 NOTE — Telephone Encounter (Signed)
Left message for someone to call me back from [facility] in regards to His appointment. ?

## 2021-07-12 DIAGNOSIS — E1165 Type 2 diabetes mellitus with hyperglycemia: Secondary | ICD-10-CM | POA: Diagnosis not present

## 2021-07-13 ENCOUNTER — Telehealth: Payer: Self-pay

## 2021-07-13 NOTE — Telephone Encounter (Signed)
Patient called back to say he spoke with Dr. Meade Maw, MD, MPHS's office and was told that they asked [name], NP, to send the referral to Augusto Garbe, MD.  I apologized for not seeing that referral initially and let him know that it looks like the referral was sent to Augusto Garbe, MD, on our end.  He said he was told that the referral has not been received by Augusto Garbe, MD's office.  I let him know that our Referral Coordinator is not here today, but I will let her know so she can follow-up. ?

## 2021-07-13 NOTE — Telephone Encounter (Signed)
Patient called to say that we referred him to a neurosurgeon and he has not heard from them yet about scheduling his appointment.  I gave the patient the name and phone number for the neurosurgeon we referred him to Meade Maw, MD, [facility]) and let him know he can call their office to schedule. ?

## 2021-07-13 NOTE — Telephone Encounter (Signed)
Please ask for medical records of last 2 visits of eye exam with dr dimgeldein ?

## 2021-07-13 NOTE — Telephone Encounter (Signed)
Left VM at [facility] for someone to call me back to get records of last 2 visits. ?

## 2021-07-17 ENCOUNTER — Ambulatory Visit (INDEPENDENT_AMBULATORY_CARE_PROVIDER_SITE_OTHER): Payer: Medicare Other | Admitting: Vascular Surgery

## 2021-07-17 ENCOUNTER — Ambulatory Visit (INDEPENDENT_AMBULATORY_CARE_PROVIDER_SITE_OTHER): Payer: Medicare Other

## 2021-07-17 ENCOUNTER — Encounter (INDEPENDENT_AMBULATORY_CARE_PROVIDER_SITE_OTHER): Payer: Self-pay | Admitting: Vascular Surgery

## 2021-07-17 ENCOUNTER — Telehealth: Payer: Self-pay | Admitting: Family

## 2021-07-17 ENCOUNTER — Ambulatory Visit: Payer: Self-pay | Admitting: Urology

## 2021-07-17 VITALS — BP 116/76 | HR 105 | Resp 17 | Ht 65.0 in | Wt 186.0 lb

## 2021-07-17 DIAGNOSIS — I748 Embolism and thrombosis of other arteries: Secondary | ICD-10-CM

## 2021-07-17 DIAGNOSIS — I10 Essential (primary) hypertension: Secondary | ICD-10-CM | POA: Diagnosis not present

## 2021-07-17 DIAGNOSIS — E119 Type 2 diabetes mellitus without complications: Secondary | ICD-10-CM

## 2021-07-17 NOTE — Telephone Encounter (Signed)
Left message with medical records for someone to give me a call back! ?

## 2021-07-17 NOTE — Assessment & Plan Note (Signed)
Duplex today shows no abnormalities in the visceral vessels with normal flow in the splenic artery suggesting a patent splenic artery stent.  Overall doing well.  No changes at this time.  Recheck in 1 year ?

## 2021-07-17 NOTE — Telephone Encounter (Signed)
Pt called in stating that he had spoke with his neurologist and they advised that he needed to have his provider to fax over all medical records and mri disc to neurologist... Pt requesting callback  ?

## 2021-07-17 NOTE — Progress Notes (Signed)
? ? ?MRN : [id number] ? ?Benjamin Conley is a 56 y.o. (09-27-1965) male who presents with chief complaint of No chief complaint on file. ?. ? ?History of Present Illness: Patient returns today in follow up of his previous splenic artery intervention.  He is doing well today.  He denies any abdominal pain.  No major issues or concerns at this time.  He has had no major changes since his last visit 1 year ago. Duplex today shows no abnormalities in the visceral vessels with normal flow in the splenic artery suggesting a patent splenic artery stent. ? ?Current Outpatient Medications  ?Medication Sig Dispense Refill  ? albuterol (VENTOLIN HFA) 108 (90 Base) MCG/ACT inhaler Inhale 2 puffs into the lungs every 6 (six) hours as needed for wheezing or shortness of breath. 6.7 g 3  ? aspirin 81 MG EC tablet Take 1 tablet (81 mg total) by mouth daily. 90 tablet 3  ? atorvastatin (LIPITOR) 40 MG tablet Take 1 tablet (40 mg total) by mouth daily. 90 tablet 3  ? blood glucose meter kit and supplies Dispense based on patient and insurance preference. Use up to four times daily as directed. (FOR ICD-10 E10.9, E11.9). 1 each 0  ? Cholecalciferol (VITAMIN D) 125 MCG (5000 UT) CAPS Take 1 capsule by mouth daily.    ? clopidogrel (PLAVIX) 75 MG tablet Take 1 tablet (75 mg total) by mouth daily. 90 tablet 3  ? Continuous Blood Gluc Sensor (FREESTYLE LIBRE 2 SENSOR) MISC 1 each by Does not apply route every 14 (fourteen) days. 2 each 4  ? digoxin (LANOXIN) 0.125 MG tablet Take 1 tablet (0.125 mg total) by mouth daily. 30 tablet 3  ? fluticasone (FLONASE) 50 MCG/ACT nasal spray Place 2 sprays into both nostrils daily. 16 g 6  ? insulin glargine (LANTUS) 100 UNIT/ML Solostar Pen Inject 9 Units into the skin at bedtime. 15 mL 0  ? insulin lispro (HUMALOG KWIKPEN) 100 UNIT/ML KwikPen Inject 4 Units into the skin 2 (two) times daily with a meal. Lunch and dinner 15 mL 11  ? Insulin Pen Needle 32G X 4 MM MISC Use as directed to administer  insulin 100 each 2  ? midodrine (PROAMATINE) 5 MG tablet Take 1.5 tablets (7.5 mg total) by mouth 3 (three) times daily with meals. 135 tablet 1  ? nystatin-triamcinolone ointment (MYCOLOG) Apply 1 application topically 2 (two) times daily. 60 g 0  ? pantoprazole (PROTONIX) 20 MG tablet TAKE 1 TABLET BY MOUTH ONCE DAILY IN THE MORNING 60  MINUTES  BEFORE  BREAKFAST 90 tablet 0  ? Semaglutide, 2 MG/DOSE, 8 MG/3ML SOPN Inject 2 mg as directed once a week. 9 mL 1  ? sildenafil (VIAGRA) 100 MG tablet Take 1 tablet (100 mg total) by mouth daily as needed for erectile dysfunction. 30 tablet 0  ? silodosin (RAPAFLO) 8 MG CAPS capsule Take 1 capsule (8 mg total) by mouth daily. 90 capsule 0  ? triamcinolone cream (KENALOG) 0.1 % Apply 1 application topically 2 (two) times daily. 30 g 0  ? ?No current facility-administered medications for this visit.  ? ? ?Past Medical History:  ?Diagnosis Date  ? Acute posthemorrhagic anemia   ? Complication of anesthesia   ? c/o difficulty breathing after anesthesia  ? COPD (chronic obstructive pulmonary disease) ([place])   ? Diabetes mellitus without complication ([place])   ? Hyperlipemia   ? Melena 08/21/2019  ? Paroxysmal supraventricular tachycardia ([place]) 08/19/2019  ? Postural dizziness with presyncope 08/21/2019  ?  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 Tobacco abuse   ? Upper GI bleed 08/21/2019  ? ? ?Past Surgical History:  ?Procedure Laterality Date  ? BACK SURGERY    ? lumbar  ? COLONOSCOPY WITH PROPOFOL N/A 08/24/2019  ? Procedure: COLONOSCOPY WITH PROPOFOL;  Surgeon: [name], MD;  [facility]: [facility]COPY;  Service: Endoscopy;  Laterality: N/A;  ? ESOPHAGOGASTRODUODENOSCOPY (EGD) WITH PROPOFOL N/A 08/24/2019  ? Procedure: ESOPHAGOGASTRODUODENOSCOPY (EGD) WITH PROPOFOL;  Surgeon: [name], MD;  Location[facility];  Service: Endoscopy;  Laterality: N/A;  ? STENT PLACEMENT VASCULAR ([place] [facility])  07/2019  ? stenosis of distal splenic artery and stent placed  ? VISCERAL ANGIOGRAPHY N/A  08/06/2019  ? Procedure: VISCERAL ANGIOGRAPHY;  Surgeon: [name], MD;  Location: [place] CV LAB;  Service: Cardiovascular;  Laterality: N/A;  ? ? ? ?Social History  ? ?Tobacco Use  ? Smoking status: Former  ?  Packs/day: 1.00  ?  Years: 48.00  ?  Pack years: 48.00  ?  Types: Cigarettes  ?  Quit date: 02/12/2020  ?  Years since quitting: 1.4  ? Smokeless tobacco: Never  ?Vaping Use  ? Vaping Use: Never used  ?Substance Use Topics  ? Alcohol use: No  ?  Comment: Never been a problem  ? Drug use: No  ? ? ? ? ?Family History  ?Problem Relation Age of Onset  ? Diabetes Mother   ? Diabetes Mellitus II Mother   ? Tuberculosis Father   ? Heart disease Father   ? Diabetes Sister   ? Diabetes Sister   ? Diabetes Sister   ? Diabetes Sister   ? Diabetes Brother   ? Diabetes Mellitus II Brother   ? Hyperlipidemia Brother   ? Cancer Maternal Aunt   ? Colon cancer Maternal Uncle   ? Prostate cancer Neg Hx   ? ? ? ?Allergies  ?Allergen Reactions  ? Wasp Venom Anaphylaxis  ? Wasp Venom Protein Anaphylaxis  ? Coffee Flavor Nausea And Vomiting  ?  Patient states coffee gives him nausea and stomach cramps  ? Onion Nausea And Vomiting  ? ? ?REVIEW OF SYSTEMS (Negative unless checked) ?  ?Constitutional: [] Weight loss  [] Fever  [] Chills ?Cardiac: [] Chest pain   [] Chest pressure   [] Palpitations   [] Shortness of breath when laying flat   [] Shortness of breath at rest   [] Shortness of breath with exertion. ?Vascular:  [] Pain in legs with walking   [] Pain in legs at rest   [] Pain in legs when laying flat   [] Claudication   [] Pain in feet when walking  [] Pain in feet at rest  [] Pain in feet when laying flat   [] History of DVT   [] Phlebitis   [] Swelling in legs   [] Varicose veins   [] Non-healing ulcers ?Pulmonary:   [] Uses home oxygen   [] Productive cough   [] Hemoptysis   [] Wheeze  [] COPD   [] Asthma ?Neurologic:  [] Dizziness  [] Blackouts   [] Seizures   [] History of stroke   [] History of TIA  [] Aphasia   [] Temporary blindness    [] Dysphagia   [] Weakness or numbness in arms   [] Weakness or numbness in legs ?Musculoskeletal:  [x] Arthritis   [] Joint swelling   [] Joint pain   [] Low back pain ?Hematologic:  [] Easy bruising  [] Easy bleeding   [] Hypercoagulable state   [] Anemic   ?Gastrointestinal:  [] Blood in stool   [] Vomiting blood  [x] Gastroesophageal reflux/heartburn   [] Abdominal pain ?Genitourinary:  [] Chronic kidney disease   [] Difficult urination  [] Frequent  urination  [] Burning with urination   [] Hematuria ?Skin:  [] Rashes   [] Ulcers   [] Wounds ?Psychological:  [] History of anxiety   []  History of major depression. ? ?Physical Examination ? ?BP 116/76 (BP Location: Left Arm)   Pulse (!) 105   Resp 17   Ht 5' 5"  (1.651 m)   Wt 186 lb (84.4 kg)   BMI 30.95 kg/m?  ?Gen:  WD/WN, NAD. Appears older than stated age. ?Head: [place]/AT, No temporalis wasting. ?Ear/Nose/Throat: Hearing grossly intact, nares w/o erythema or drainage ?Eyes: Conjunctiva clear. Sclera non-icteric ?Neck: Supple.  Trachea midline ?Pulmonary:  Good air movement, no use of accessory muscles.  ?Cardiac: tachycardic ?Vascular:  ?Vessel Right Left  ?Radial Palpable Palpable  ?    ?    ?    ?    ?    ?    ?    ?    ? ?Gastrointestinal: soft, non-tender/non-distended. No guarding/reflex.  ?Musculoskeletal: M/S 5/5 throughout.  No deformity or atrophy. No edema. ?Neurologic: Sensation grossly intact in extremities.  Symmetrical.  Speech is fluent.  ?Psychiatric: Judgment intact, Mood & affect appropriate for pt's clinical situation. ?Dermatologic: No rashes or ulcers noted.  No cellulitis or open wounds. ? ? ? ? ? ?Labs ?Recent Results (from the past 2160 hour(s))  ?PSA     Status: Abnormal  ? Collection Time: 04/23/21 10:36 AM  ?Result Value Ref Range  ? Prostate Specific Ag, Serum 4.1 (H) 0.0 - 4.0 ng/mL  ?  Comment: Roche ECLIA methodology. ?According to the American Urological Association, Serum PSA should ?decrease and remain at undetectable levels after  radical ?prostatectomy. The AUA defines biochemical recurrence as an initial ?PSA value 0.2 ng/mL or greater followed by a subsequent confirmatory ?PSA value 0.2 ng/mL or greater. ?Values obtained with different assay methods or

## 2021-07-18 ENCOUNTER — Ambulatory Visit (INDEPENDENT_AMBULATORY_CARE_PROVIDER_SITE_OTHER): Payer: Medicare Other | Admitting: *Deleted

## 2021-07-18 DIAGNOSIS — I951 Orthostatic hypotension: Secondary | ICD-10-CM

## 2021-07-18 DIAGNOSIS — E119 Type 2 diabetes mellitus without complications: Secondary | ICD-10-CM

## 2021-07-18 DIAGNOSIS — E785 Hyperlipidemia, unspecified: Secondary | ICD-10-CM

## 2021-07-18 DIAGNOSIS — I10 Essential (primary) hypertension: Secondary | ICD-10-CM

## 2021-07-18 NOTE — Chronic Care Management (AMB) (Signed)
?Chronic Care Management  ? ?CCM RN Visit Note ? ?07/18/2021 ?Name: Benjamin Conley MRN: [id number] DOB: 10-09-1965 ? ?Subjective: ?Benjamin Conley is a 56 y.o. year old male who is a primary care patient of Burnard Hawthorne, [name]. The care management team was consulted for assistance with disease management and care coordination needs.   ? ?Engaged with patient by telephone for initial visit in response to provider referral for case management and/or care coordination services.  ? ?Consent to Services:  ?The patient was given the following information about Chronic Care Management services today, agreed to services, and gave verbal consent: 1. CCM service includes personalized support from designated clinical staff supervised by the primary care provider, including individualized plan of care and coordination with other care providers 2. 24/7 contact phone numbers for assistance for urgent and routine care needs. 3. Service will only be billed when office clinical staff spend 20 minutes or more in a month to coordinate care. 4. Only one practitioner may furnish and bill the service in a calendar month. 5.The patient may stop CCM services at any time (effective at the end of the month) by phone call to the office staff. 6. The patient will be responsible for cost sharing (co-pay) of up to 20% of the service fee (after annual deductible is met). Patient agreed to services and consent obtained. ? ?Patient agreed to services and verbal consent obtained.  ? ?Assessment: Review of patient past medical history, allergies, medications, health status, including review of consultants reports, laboratory and other test data, was performed as part of comprehensive evaluation and provision of chronic care management services.  ? ?SDOH (Social Determinants of Health) assessments and interventions performed:  ?SDOH Interventions   ? ?Flowsheet Row Most Recent Value  ?SDOH Interventions   ?Food Insecurity Interventions  Intervention Not Indicated  [RECIEVES $20 IN FOOD STAMPS]  ?Financial Strain Interventions Intervention Not Indicated  ?Housing Interventions Intervention Not Indicated  ?Intimate Partner Violence Interventions Intervention Not Indicated  ?Physical Activity Interventions Intervention Not Indicated  ?Stress Interventions Intervention Not Indicated  ?Transportation Interventions Intervention Not Indicated  ? ?  ?  ? ?[place] ? ?Allergies  ?Allergen Reactions  ? Wasp Venom Anaphylaxis  ? Wasp Venom Protein Anaphylaxis  ? Coffee Flavor Nausea And Vomiting  ?  Patient states coffee gives him nausea and stomach cramps  ? Onion Nausea And Vomiting  ? ? ?Outpatient Encounter Medications as of 07/18/2021  ?Medication Sig  ? albuterol (VENTOLIN HFA) 108 (90 Base) MCG/ACT inhaler Inhale 2 puffs into the lungs every 6 (six) hours as needed for wheezing or shortness of breath.  ? aspirin 81 MG EC tablet Take 1 tablet (81 mg total) by mouth daily.  ? atorvastatin (LIPITOR) 40 MG tablet Take 1 tablet (40 mg total) by mouth daily.  ? Cholecalciferol (VITAMIN D) 125 MCG (5000 UT) CAPS Take 1 capsule by mouth daily.  ? clopidogrel (PLAVIX) 75 MG tablet Take 1 tablet (75 mg total) by mouth daily.  ? digoxin (LANOXIN) 0.125 MG tablet Take 1 tablet (0.125 mg total) by mouth daily.  ? fluticasone (FLONASE) 50 MCG/ACT nasal spray Place 2 sprays into both nostrils daily.  ? insulin glargine (LANTUS) 100 UNIT/ML Solostar Pen Inject 9 Units into the skin at bedtime.  ? insulin lispro (HUMALOG KWIKPEN) 100 UNIT/ML KwikPen Inject 4 Units into the skin 2 (two) times daily with a meal. Lunch and dinner  ? midodrine (PROAMATINE) 5 MG tablet Take 1.5 tablets (7.5 mg  total) by mouth 3 (three) times daily with meals.  ? nystatin-triamcinolone ointment (MYCOLOG) Apply 1 application topically 2 (two) times daily.  ? pantoprazole (PROTONIX) 20 MG tablet TAKE 1 TABLET BY MOUTH ONCE DAILY IN THE MORNING 60  MINUTES  BEFORE  BREAKFAST  ? Semaglutide,  2 MG/DOSE, 8 MG/3ML SOPN Inject 2 mg as directed once a week.  ? sildenafil (VIAGRA) 100 MG tablet Take 1 tablet (100 mg total) by mouth daily as needed for erectile dysfunction.  ? silodosin (RAPAFLO) 8 MG CAPS capsule Take 1 capsule (8 mg total) by mouth daily.  ? triamcinolone cream (KENALOG) 0.1 % Apply 1 application topically 2 (two) times daily.  ? blood glucose meter kit and supplies Dispense based on patient and insurance preference. Use up to four times daily as directed. (FOR ICD-10 E10.9, E11.9).  ? Continuous Blood Gluc Sensor (FREESTYLE LIBRE 2 SENSOR) MISC 1 each by Does not apply route every 14 (fourteen) days.  ? Insulin Pen Needle 32G X 4 MM MISC Use as directed to administer insulin  ? ?No facility-administered encounter medications on file as of 07/18/2021.  ? ? ?Patient Active Problem List  ? Diagnosis Date Noted  ? Dizziness 06/11/2021  ? Sinus congestion 06/11/2021  ? Pulmonary emphysema ([place]) 05/07/2021  ? GERD (gastroesophageal reflux disease) 01/12/2021  ? Screening for blood or protein in urine 06/16/2020  ? Contact dermatitis 06/16/2020  ? Leukocytosis 01/30/2020  ? Nicotine use disorder 01/27/2020  ? Slow transit constipation 01/27/2020  ? Nail avulsion of toe, initial encounter 12/09/2019  ? Benign prostatic hyperplasia with hesitancy 10/30/2019  ? Abnormal ejaculation 10/21/2019  ? B12 deficiency 10/21/2019  ? Rectal pressure 10/21/2019  ? 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 Abnormal brain MRI 09/22/2019  ? Difficulty sleeping 09/22/2019  ? Tinnitus of both ears 09/22/2019  ? Pain due to onychomycosis of toenails of both feet 09/06/2019  ? Coagulation disorder ([place]) 09/06/2019  ? Iron deficiency anemia due to chronic blood loss 08/29/2019  ? Thrombocytosis 08/24/2019  ? Angiodysplasia of intestinal tract   ? Black stool 08/22/2019  ? Presence of arterial stent - splenic artery 08/21/2019  ? Orthostatic hypotension 08/19/2019  ? Tachycardia 08/19/2019  ? 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 COPD with chronic bronchitis ([place]) 08/01/2019  ? Tobacco abuse 08/01/2019  ? Diabetes mellitus without complication ([place])   ? Hyperlipidemia   ? Impingement syndrome of shoulder region 03/30/2018  ? Hypertension 11/10/2013  ? ? ?Conditions to be addressed/monitored:HTN, HLD, and DMII ? ?Care Plan : Diabetes Type 2 (Adult)  ?Updates made by Leona Singleton, RN since 07/18/2021 12:00 AM  ?Completed 07/18/2021  ? ?Problem: Disease Progression (Diabetes, Type 2) Resolved 07/18/2021  ?  ? ?Long-Range Goal: Disease Progression Prevented or Minimized Completed 07/18/2021  ?Start Date: 11/27/2020  ?Expected End Date: 02/25/2021  ?Priority: High  ?Note:   ?RESOLVING DUE TO DUPLICATE GOALS ? ? ?Lab Results  ?Component Value Date  ? HGBA1C 7.8 (A) 11/21/2020  ?  ?Current Barriers:  ?Chronic Disease Management support and educational needs related to Diabetes self-management. ? ?Case Manager Clinical Goal(s):  ?Over the next 90 days, patient will demonstrate improved adherence to prescribed treatment plan for diabetes self care/management as evidenced by: taking medications as prescribed, daily monitoring and recording CBG's and adherence to a ADA/ carb modified diet. ? ?Interventions:  ?Collaboration with Flinchum, Kelby Aline, [name] regarding development and update of comprehensive plan of care as evidenced by provider attestation and co-signature ?Inter-disciplinary  care team collaboration (see longitudinal plan of care) ?Reviewed medications. Reports taking as prescribed. Expressed concerns regarding Ozempic. Reports completing and returning the required documentation for medication assistance. Reports the dose was recently increased. He is using his current pen but will require a refill sooner than expected due to dose increase. Agreed to contact he clinic or care management team if he is unable to obtain it from [facility]. ?Discussed importance of monitoring blood glucose consistently and maintaining a log.  Discussed s/sx of hypoglycemia and hyperglycemia along with recommended interventions. Denies symptoms. Reports still occasionally consuming high carb foods and concentrated sugars but feels his diet continues

## 2021-07-18 NOTE — Patient Instructions (Addendum)
Visit Information  ? ?Thank you for taking time to visit with me today. Please don't hesitate to contact me if I can be of assistance to you before our next scheduled telephone appointment. ? ?Following are the goals we discussed today:  ?Take all medications as prescribed ?Attend all scheduled provider appointments ?Perform all self care activities independently  ?Call provider office for new concerns or questions  ?check blood sugar at prescribed times: 4 times daily ?enter blood sugar readings and medication or insulin into daily log ?fill half of plate with vegetables ?manage portion size ?set a realistic goal ?check blood pressure weekly ?write blood pressure results in a log or diary ?learn about high blood pressure ?take blood pressure log to all doctor appointments ?call doctor for signs and symptoms of high blood pressure ?keep all doctor appointments ?call doctor when you experience any new symptoms ? ?Our next appointment is by telephone on 6/16 at 1930 ? ?Please call the care guide team at [phone] if you need to cancel or reschedule your appointment.  ? ?If you are experiencing a Mental Health or [place] or need someone to talk to, please call the Suicide and Crisis Lifeline: 988 ?call the [place] National Suicide Prevention Lifeline: (570)301-2597 or TTY: 7861456794 TTY (228)359-4947) to talk to a trained counselor ?call 1-800-273-TALK (toll free, 24 hour hotline) ?call 911  ? ?Following is a copy of your full care plan:  ?Care Plan : [place] of Care (Adult)  ?Updates made by Leona Singleton, RN since 07/18/2021 12:00 AM  ?  ? ?Problem: KNOWLEDGE DEFICIT RELATED TO SELF CARE MANAGEMENT OF CHRONIC CONDITIONS   ?Priority: Medium  ?  ? ?Goal: PATIENT WILL WORK WITH CCM TEAM TO GAIN KNOWLEDGE TO HELP CARE FOR [place] CONDITIONS   ?Start Date: 07/18/2021  ?Expected End Date: 07/18/2022  ?Priority: Medium  ?Note:   ?Current Barriers:  ?Knowledge Deficits related to plan of care  for management of HTN, HLD, and DMII  ?Chronic Disease Management support and education needs related to HTN, HLD, and DMII  ?5/10--fasting blood sugar 153 this morning with recent ranges of 150-170's.  Does report occasional hypoglycemia episode ? ?RNCM Clinical Goal(s):  ?Patient will verbalize basic understanding of  HTN, HLD, and DMII disease process and self health management plan as evidenced by DECREASING HGB A1C BY 0.2 POINTS ?demonstrate Improved health management independence as evidenced by MONITORING BPS, DECREASING A1C, INCREASING FRUITS AND VEGETABLES IN DIET  through collaboration with RN Care manager, provider, and care team.  ? ?Interventions: ?1:1 collaboration with primary care provider regarding development and update of comprehensive plan of care as evidenced by provider attestation and co-signature ?Inter-disciplinary care team collaboration (see longitudinal plan of care) ?Evaluation of current treatment plan related to  self management and patient's adherence to plan as established by provider ? ? ?Diabetes Interventions:  (Status:  New goal.) Long Term Goal ?Assessed patient's understanding of A1c goal: <6.5% ?Provided education to patient about basic DM disease process ?Reviewed medications with patient and discussed importance of medication adherence ?Counseled on importance of regular laboratory monitoring as prescribed ?Discussed plans with patient for ongoing care management follow up and provided patient with direct contact information for care management team ?Provided patient with written educational materials related to hypo and hyperglycemia and importance of correct treatment ?Advised patient, providing education and rationale, to check cbg qid and record, calling PCP for findings outside established parameters ?Assessed social determinant of health barriers ?Discussed low carb  heart healthy diet ?Discussed current Hgb A1C and ways to help reduce it and blood sugars ?Lab Results   ?Component Value Date  ? HGBA1C 7.8 (H) 05/09/2021  ? ?Hypertension Interventions:  (Status:  New goal.) Long Term Goal ?Last practice recorded BP readings:  ?BP Readings from Last 3 Encounters:  ?07/17/21 116/76  ?07/06/21 132/88  ?06/29/21 120/66  ?Most recent eGFR/CrCl:  ?Lab Results  ?Component Value Date  ? EGFR 105 02/23/2021  ?  No components found for: CRCL ? ?Evaluation of current treatment plan related to hypertension self management and patient's adherence to plan as established by provider ?Reviewed medications with patient and discussed importance of compliance ?Counseled on the importance of exercise goals with target of 150 minutes per week ?Discussed plans with patient for ongoing care management follow up and provided patient with direct contact information for care management team ?Provided education on prescribed diet low carb heart healthy ?Discussed complications of poorly controlled blood pressure such as heart disease, stroke, circulatory complications, vision complications, kidney impairment, sexual dysfunction ?Screening for signs and symptoms of depression related to chronic disease state  ?Encouraged to monitor blood pressures at least weekly for hypo and hypertension ?Discussed and encouraged eating healthier meal options; encouraged to try new vegetable every ? ?Patient Goals/Self-Care Activities: ?Take all medications as prescribed ?Attend all scheduled provider appointments ?Perform all self care activities independently  ?Call provider office for new concerns or questions  ?check blood sugar at prescribed times: 4 times daily ?enter blood sugar readings and medication or insulin into daily log ?fill half of plate with vegetables ?manage portion size ?set a realistic goal ?check blood pressure weekly ?write blood pressure results in a log or diary ?learn about high blood pressure ?take blood pressure log to all doctor appointments ?call doctor for signs and symptoms of high blood  pressure ?keep all doctor appointments ?call doctor when you experience any new symptoms ? ?Follow Up Plan:  The care management team will reach out to the patient again over the next 45 days. ? ?  ? ? ?Consent to CCM Services: ?Mr. Kakos was given information about Chronic Care Management services including:  ?CCM service includes personalized support from designated clinical staff supervised by his physician, including individualized plan of care and coordination with other care providers ?24/7 contact phone numbers for assistance for urgent and routine care needs. ?Service will only be billed when office clinical staff spend 20 minutes or more in a month to coordinate care. ?Only one practitioner may furnish and bill the service in a calendar month. ?The patient may stop CCM services at any time (effective at the end of the month) by phone call to the office staff. ?The patient will be responsible for cost sharing (co-pay) of up to 20% of the service fee (after annual deductible is met). ? ?Patient agreed to services and verbal consent obtained.  ? ?Patient verbalizes understanding of instructions and care plan provided today and agrees to view in [place]. Active MyChart status confirmed with patient.   ? ?The care management team will reach out to the patient again over the next 45 days.  ? ?Hubert Azure RN, MSN ?RN Care Management Coordinator ?[place] ?[phone] ?[email or web address] ? ? ?  ?

## 2021-07-19 NOTE — Telephone Encounter (Signed)
Faxed medical release form to [facility] on 07/19/21 ?

## 2021-07-24 ENCOUNTER — Telehealth: Payer: Self-pay | Admitting: Urology

## 2021-07-24 NOTE — Telephone Encounter (Signed)
Spoke to patient and explained to him that he would have to contact medical records to get ALL of his files sent over to Neurology!! Also I informed  him that wherever he had MRI done he would have to get the disk from that office. ?

## 2021-07-24 NOTE — Telephone Encounter (Signed)
Benjamin Conley never was scheduled for his prostate biopsy as he needed to be cleared by vascular to come off his Plavix and he also has had issues with low BP and is on medication for this now so we will need cardiac clearance for this as well.   ? ?In the meantime, I would have Benjamin Conley come in and have his PSA rechecked.  ?

## 2021-07-24 NOTE — Telephone Encounter (Signed)
Appt with Dr Francesca Oman 08/16/21 ?  ? ?

## 2021-07-25 ENCOUNTER — Ambulatory Visit (INDEPENDENT_AMBULATORY_CARE_PROVIDER_SITE_OTHER): Payer: Medicare Other

## 2021-07-25 DIAGNOSIS — R42 Dizziness and giddiness: Secondary | ICD-10-CM

## 2021-07-27 ENCOUNTER — Encounter: Payer: Self-pay | Admitting: Oncology

## 2021-07-27 ENCOUNTER — Ambulatory Visit (INDEPENDENT_AMBULATORY_CARE_PROVIDER_SITE_OTHER): Payer: Medicare Other | Admitting: Urology

## 2021-07-27 ENCOUNTER — Encounter: Payer: Self-pay | Admitting: Urology

## 2021-07-27 VITALS — BP 115/78 | HR 109 | Ht 65.0 in | Wt 186.0 lb

## 2021-07-27 DIAGNOSIS — R972 Elevated prostate specific antigen [PSA]: Secondary | ICD-10-CM

## 2021-07-27 MED ORDER — LEVOFLOXACIN 500 MG PO TABS
500.0000 mg | ORAL_TABLET | Freq: Once | ORAL | Status: AC
  Administered 2021-07-27: 500 mg via ORAL

## 2021-07-27 MED ORDER — GENTAMICIN SULFATE 40 MG/ML IJ SOLN
80.0000 mg | Freq: Once | INTRAMUSCULAR | Status: AC
  Administered 2021-07-27: 80 mg via INTRAMUSCULAR

## 2021-07-27 NOTE — Progress Notes (Signed)
Prostate Biopsy Procedure   Informed consent was obtained after discussing risks/benefits of the procedure.  A time out was performed to ensure correct patient identity. Plavix has been held x5 days  Pre-Procedure: - Last PSA Level: 4.1-04/2021 - Gentamicin given prophylactically - Levaquin 500 mg administered PO -Transrectal Ultrasound performed revealing a 47 gm prostate -No significant hypoechoic or median lobe noted; prostatic calcifications noted  Procedure: - Prostate block performed using 10 cc 1% lidocaine and biopsies taken from sextant areas, a total of 12 under ultrasound guidance.  Post-Procedure: - Patient tolerated the procedure well - He was counseled to seek immediate medical attention if experiences any severe pain, significant bleeding, or fevers - May resume Plavix 07/28/2021 if urine clear - Return in one week to discuss biopsy results   John Giovanni, MD

## 2021-07-27 NOTE — Patient Instructions (Signed)

## 2021-07-30 LAB — SURGICAL PATHOLOGY

## 2021-08-02 ENCOUNTER — Ambulatory Visit (INDEPENDENT_AMBULATORY_CARE_PROVIDER_SITE_OTHER): Payer: Medicare Other | Admitting: Urology

## 2021-08-02 ENCOUNTER — Encounter: Payer: Self-pay | Admitting: Urology

## 2021-08-02 VITALS — BP 127/77 | HR 108 | Ht 70.0 in | Wt 186.0 lb

## 2021-08-02 DIAGNOSIS — R972 Elevated prostate specific antigen [PSA]: Secondary | ICD-10-CM

## 2021-08-02 NOTE — Progress Notes (Signed)
08/02/2021 2:58 PM   Benjamin Conley 24-Dec-1965 [id number]  Referring provider: Burnard Hawthorne, [name] [address] [place],  [place] [address]  Chief Complaint  Patient presents with   Elevated PSA    HPI: 56 y.o. male presents for prostate biopsy follow-up.  Bx 5/19 PSA 4.1; prostate volume 47 g; marked prostatic calcifications No postbiopsy complaints Pathology 12/12 cores benign prostate tissue   PMH: Past Medical History:  Diagnosis Date   Acute posthemorrhagic anemia    Complication of anesthesia    c/o difficulty breathing after anesthesia   COPD (chronic obstructive pulmonary disease) (HCC)    Diabetes mellitus without complication ([place])    Hyperlipemia    Melena 08/21/2019   Paroxysmal supraventricular tachycardia (HCC) 08/19/2019   Postural dizziness with presyncope 08/21/2019   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   Tobacco abuse    Upper GI bleed 08/21/2019    Surgical History: Past Surgical History:  Procedure Laterality Date   BACK SURGERY     lumbar   COLONOSCOPY WITH PROPOFOL N/A 08/24/2019   Procedure: COLONOSCOPY WITH PROPOFOL;  Surgeon: [name], MD;  Location: [facility] ENDOSCOPY;  Service: Endoscopy;  Laterality: N/A;   ESOPHAGOGASTRODUODENOSCOPY (EGD) WITH PROPOFOL N/A 08/24/2019   Procedure: ESOPHAGOGASTRODUODENOSCOPY (EGD) WITH PROPOFOL;  Surgeon: [name], MD;  Location[facility] ENDOSCOPY;  Service: Endoscopy;  Laterality: N/A;   STENT PLACEMENT VASCULAR ([place] [facility])  07/2019   stenosis of distal splenic artery and stent placed   VISCERAL ANGIOGRAPHY N/A 08/06/2019   Procedure: VISCERAL ANGIOGRAPHY;  Surgeon: [name], MD;  Location: [facility] CV LAB;  Service: Cardiovascular;  Laterality: N/A;    Home Medications:  Allergies as of 08/02/2021       Reactions   Wasp Venom Anaphylaxis   Wasp Venom Protein Anaphylaxis   Coffee Flavor Nausea And Vomiting   Patient states coffee gives him nausea and stomach cramps   Onion  Nausea And Vomiting        Medication List        Accurate as of Aug 02, 2021  2:58 PM. If you have any questions, ask your nurse or doctor.          albuterol 108 (90 Base) MCG/ACT inhaler Commonly known as: VENTOLIN HFA Inhale 2 puffs into the lungs every 6 (six) hours as needed for wheezing or shortness of breath.   aspirin EC 81 MG tablet Take 1 tablet (81 mg total) by mouth daily.   atorvastatin 40 MG tablet Commonly known as: LIPITOR Take 1 tablet (40 mg total) by mouth daily.   blood glucose meter kit and supplies Dispense based on patient and insurance preference. Use up to four times daily as directed. (FOR ICD-10 E10.9, E11.9).   clopidogrel 75 MG tablet Commonly known as: PLAVIX Take 1 tablet (75 mg total) by mouth daily.   digoxin 0.125 MG tablet Commonly known as: Lanoxin Take 1 tablet (0.125 mg total) by mouth daily.   fluticasone 50 MCG/ACT nasal spray Commonly known as: FLONASE Place 2 sprays into both nostrils daily.   FreeStyle Libre 2 Sensor Misc 1 each by Does not apply route every 14 (fourteen) days.   insulin glargine 100 UNIT/ML Solostar Pen Commonly known as: LANTUS Inject 9 Units into the skin at bedtime.   insulin lispro 100 UNIT/ML KwikPen Commonly known as: HumaLOG KwikPen Inject 4 Units into the skin 2 (two) times daily with a meal. Lunch and dinner   Insulin Pen Needle 32G X 4 MM  Misc Use as directed to administer insulin   midodrine 5 MG tablet Commonly known as: PROAMATINE Take 1.5 tablets (7.5 mg total) by mouth 3 (three) times daily with meals.   nystatin-triamcinolone ointment Commonly known as: MYCOLOG Apply 1 application topically 2 (two) times daily.   pantoprazole 20 MG tablet Commonly known as: PROTONIX TAKE 1 TABLET BY MOUTH ONCE DAILY IN THE MORNING 60  MINUTES  BEFORE  BREAKFAST   Semaglutide (2 MG/DOSE) 8 MG/3ML Sopn Inject 2 mg as directed once a week.   sildenafil 100 MG tablet Commonly known as:  VIAGRA Take 1 tablet (100 mg total) by mouth daily as needed for erectile dysfunction.   silodosin 8 MG Caps capsule Commonly known as: RAPAFLO Take 1 capsule (8 mg total) by mouth daily.   triamcinolone cream 0.1 % Commonly known as: KENALOG Apply 1 application topically 2 (two) times daily.   Vitamin D 125 MCG (5000 UT) Caps Take 1 capsule by mouth daily.        Allergies:  Allergies  Allergen Reactions   Wasp Venom Anaphylaxis   Wasp Venom Protein Anaphylaxis   Coffee Flavor Nausea And Vomiting    Patient states coffee gives him nausea and stomach cramps   Onion Nausea And Vomiting    Family History: Family History  Problem Relation Age of Onset   Diabetes Mother    Diabetes Mellitus II Mother    Tuberculosis Father    Heart disease Father    Diabetes Sister    Diabetes Sister    Diabetes Sister    Diabetes Sister    Diabetes Brother    Diabetes Mellitus II Brother    Hyperlipidemia Brother    Cancer Maternal Aunt    Colon cancer Maternal Uncle    Prostate cancer Neg Hx     Social History:  reports that he quit smoking about 17 months ago. His smoking use included cigarettes. He has a 48.00 pack-year smoking history. He has never used smokeless tobacco. He reports that he does not drink alcohol and does not use drugs.   Physical Exam: BP 127/77   Pulse (!) 108   Ht 5' 10"  (1.778 m)   Wt 186 lb (84.4 kg)   BMI 26.69 kg/m   Constitutional:  Alert and oriented, No acute distress. Psychiatric: Normal mood and affect.    Assessment & Plan:    1. Elevated PSA Benign prostate biopsy 6 month follow-up with [facility]; PSA prior   [name], MD  [facility] [address], [place], [place] [address] [phone]

## 2021-08-03 ENCOUNTER — Ambulatory Visit: Payer: Commercial Managed Care - HMO | Admitting: Cardiology

## 2021-08-08 DIAGNOSIS — Z794 Long term (current) use of insulin: Secondary | ICD-10-CM

## 2021-08-08 DIAGNOSIS — E1169 Type 2 diabetes mellitus with other specified complication: Secondary | ICD-10-CM

## 2021-08-08 DIAGNOSIS — Z87891 Personal history of nicotine dependence: Secondary | ICD-10-CM | POA: Diagnosis not present

## 2021-08-08 DIAGNOSIS — I10 Essential (primary) hypertension: Secondary | ICD-10-CM | POA: Diagnosis not present

## 2021-08-13 ENCOUNTER — Ambulatory Visit: Payer: Medicare Other | Admitting: Internal Medicine

## 2021-08-13 ENCOUNTER — Ambulatory Visit (INDEPENDENT_AMBULATORY_CARE_PROVIDER_SITE_OTHER): Payer: Medicare Other | Admitting: Family

## 2021-08-13 ENCOUNTER — Telehealth: Payer: Self-pay | Admitting: Family

## 2021-08-13 ENCOUNTER — Encounter: Payer: Self-pay | Admitting: Family

## 2021-08-13 VITALS — BP 136/78 | HR 94 | Temp 98.3°F | Ht 64.0 in | Wt 186.2 lb

## 2021-08-13 DIAGNOSIS — E1165 Type 2 diabetes mellitus with hyperglycemia: Secondary | ICD-10-CM | POA: Diagnosis not present

## 2021-08-13 DIAGNOSIS — M79641 Pain in right hand: Secondary | ICD-10-CM | POA: Insufficient documentation

## 2021-08-13 DIAGNOSIS — R42 Dizziness and giddiness: Secondary | ICD-10-CM

## 2021-08-13 DIAGNOSIS — E119 Type 2 diabetes mellitus without complications: Secondary | ICD-10-CM

## 2021-08-13 DIAGNOSIS — I10 Essential (primary) hypertension: Secondary | ICD-10-CM

## 2021-08-13 DIAGNOSIS — M79642 Pain in left hand: Secondary | ICD-10-CM | POA: Diagnosis not present

## 2021-08-13 LAB — COMPREHENSIVE METABOLIC PANEL
ALT: 17 U/L (ref 0–53)
AST: 13 U/L (ref 0–37)
Albumin: 4 g/dL (ref 3.5–5.2)
Alkaline Phosphatase: 80 U/L (ref 39–117)
BUN: 12 mg/dL (ref 6–23)
CO2: 28 mEq/L (ref 19–32)
Calcium: 9 mg/dL (ref 8.4–10.5)
Chloride: 106 mEq/L (ref 96–112)
Creatinine, Ser: 0.71 mg/dL (ref 0.40–1.50)
GFR: 103.07 mL/min (ref >60.00)
Glucose, Bld: 157 mg/dL — ABNORMAL HIGH (ref 70–99)
Potassium: 3.8 mEq/L (ref 3.5–5.1)
Sodium: 141 mEq/L (ref 135–145)
Total Bilirubin: 0.3 mg/dL (ref 0.2–1.2)
Total Protein: 7.2 g/dL (ref 6.0–8.3)

## 2021-08-13 LAB — POCT GLYCOSYLATED HEMOGLOBIN (HGB A1C): Hemoglobin A1C: 7.9 % — AB (ref 4.0–5.6)

## 2021-08-13 LAB — SEDIMENTATION RATE: Sed Rate: 18 mm/hr (ref 0–20)

## 2021-08-13 NOTE — Patient Instructions (Addendum)
STOP Pepsi and slushies and we will recheck you a1c in months   For hand pain, as discussed, let's start by scheduling Tylenol Arthritis which is a '650mg'$  tablet .   You may take 1-2 tablets every 8 hours ( scheduled) with maximum of 6 tablets per day.   For example , you could take two tablets in the morning ( 8am) and then two tablets again at 4pm.   Maximum daily dose of acetaminophen 4 g per day from all sources.  If you are taking another medication which includes acetaminophen (Tylenol) which may be in cough and cold preparations or pain medication such as Percocet, you will need to factor that into your total daily dose to be safe.  Please let me know if any questions  If hand pain doesn't improve, I would recommend consult with Dr Peggye Ley, Rosanne [place].   Please let me know

## 2021-08-13 NOTE — Progress Notes (Signed)
Subjective:    Patient ID: Benjamin Conley, male    DOB: [birth year], 56 y.o.   MRN: [id number]  CC: Benjamin Conley is a 56 y.o. male who presents today for follow up.   HPI: Complains of bilateral knuckle pain which has been ongoing Some discomfort with making a fist.   No swelling, redness, wrist pain, nunbness.    No h/o gout    DM-compliant with insulin glargine 9 units at bedtime, insulin lispro 4 units twice daily with meals.  Compliant with semaglutide 2 mg weekly. FBG 186, 126, 122, 132, 131 Post prandial 185, 282,  128, 147, 165 No hypoglycemic episodes.  Drinking pepsi 2-3 cans per day. He drinking slushie. Average glucose 148.  He rarely eats lunch. Dinner is biggest meal.    follow up with Dr Lucky Cowboy 07/17/21 thrombosis of splenic artery.  He has upcoming appointment with Dr. Francesca Oman, [facility] vascular neurosurgeon 08/16/21.   Dizziness is less often than before No syncope. No recurrence of vision loss.  No sinus congestion.  Midodrine 7.17m TID.   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   Tobacco abuse    Upper GI bleed 08/21/2019   Past Surgical History:  Procedure Laterality Date   BACK SURGERY     lumbar   COLONOSCOPY WITH PROPOFOL N/A 08/24/2019   Procedure: COLONOSCOPY WITH PROPOFOL;  Surgeon: WLucilla Lame MD;  Location: [facility] ENDOSCOPY;  Service: Endoscopy;  Laterality: N/A;   ESOPHAGOGASTRODUODENOSCOPY (EGD) WITH PROPOFOL N/A 08/24/2019   Procedure: ESOPHAGOGASTRODUODENOSCOPY (EGD) WITH PROPOFOL;  Surgeon: WLucilla Lame MD;  [facility];  Service: Endoscopy;   Laterality: N/A;   STENT PLACEMENT VASCULAR ([place])  07/2019   stenosis of distal splenic artery and stent placed   VISCERAL ANGIOGRAPHY N/A 08/06/2019   Procedure: VISCERAL ANGIOGRAPHY;  Surgeon: SKatha Cabal MD;  [facility];  Service: Cardiovascular;  Laterality: N/A;   Family History  Problem Relation Age of Onset   Diabetes Mother    Diabetes Mellitus II Mother    Tuberculosis Father    Heart disease Father    Diabetes Sister    Diabetes Sister    Diabetes Sister    Diabetes Sister    Diabetes Brother    Diabetes Mellitus II Brother    Hyperlipidemia Brother    Cancer Maternal Aunt    Colon cancer Maternal Uncle    Prostate cancer Neg Hx     Allergies: Wasp venom, Wasp venom protein, Coffee flavor, and Onion Current Outpatient Medications on File Prior to Visit  Medication Sig Dispense Refill   albuterol (VENTOLIN HFA) 108 (90 Base) MCG/ACT inhaler Inhale 2 puffs into the lungs every 6 (six) hours as needed for wheezing or shortness of breath. 6.7 g 3   aspirin 81 MG EC tablet Take 1 tablet (81 mg total) by mouth daily. 90 tablet 3   atorvastatin (LIPITOR) 40 MG tablet Take 1 tablet (40 mg total) by mouth daily. 90 tablet 3   blood glucose meter kit and supplies Dispense based on patient and insurance preference. Use up to four times daily as directed. (  FOR ICD-10 E10.9, E11.9). 1 each 0   Cholecalciferol (VITAMIN D) 125 MCG (5000 UT) CAPS Take 1 capsule by mouth daily.     clopidogrel (PLAVIX) 75 MG tablet Take 1 tablet (75 mg total) by mouth daily. 90 tablet 3   Continuous Blood Gluc Sensor (FREESTYLE LIBRE 2 SENSOR) MISC 1 each by Does not apply route every 14 (fourteen) days. 2 each 4   digoxin (LANOXIN) 0.125 MG tablet Take 1 tablet (0.125 mg total) by mouth daily. 30 tablet 3   fluticasone (FLONASE) 50 MCG/ACT nasal spray Place 2 sprays into both nostrils daily. 16 g 6   insulin glargine (LANTUS) 100 UNIT/ML Solostar Pen Inject 9 Units into  the skin at bedtime. 15 mL 0   insulin lispro (HUMALOG KWIKPEN) 100 UNIT/ML KwikPen Inject 4 Units into the skin 2 (two) times daily with a meal. Lunch and dinner 15 mL 11   Insulin Pen Needle 32G X 4 MM MISC Use as directed to administer insulin 100 each 2   midodrine (PROAMATINE) 5 MG tablet Take 1.5 tablets (7.5 mg total) by mouth 3 (three) times daily with meals. 135 tablet 1   nystatin-triamcinolone ointment (MYCOLOG) Apply 1 application topically 2 (two) times daily. 60 g 0   pantoprazole (PROTONIX) 20 MG tablet TAKE 1 TABLET BY MOUTH ONCE DAILY IN THE MORNING 60  MINUTES  BEFORE  BREAKFAST 90 tablet 0   Semaglutide, 2 MG/DOSE, 8 MG/3ML SOPN Inject 2 mg as directed once a week. 9 mL 1   sildenafil (VIAGRA) 100 MG tablet Take 1 tablet (100 mg total) by mouth daily as needed for erectile dysfunction. 30 tablet 0   triamcinolone cream (KENALOG) 0.1 % Apply 1 application topically 2 (two) times daily. 30 g 0   No current facility-administered medications on file prior to visit.    Social History   Tobacco Use   Smoking status: Former    Packs/day: 1.00    Years: 48.00    Pack years: 48.00    Types: Cigarettes    Quit date: 02/12/2020    Years since quitting: 1.5   Smokeless tobacco: Never  Vaping Use   Vaping Use: Never used  Substance Use Topics   Alcohol use: No    Comment: Never been a problem   Drug use: No    Review of Systems  Constitutional:  Negative for chills and fever.  HENT:  Negative for congestion, ear pain, rhinorrhea, sinus pressure and sore throat.   Respiratory:  Negative for cough, shortness of breath and wheezing.   Cardiovascular:  Negative for chest pain and palpitations.  Gastrointestinal:  Negative for diarrhea, nausea and vomiting.  Genitourinary:  Negative for dysuria.  Musculoskeletal:  Positive for arthralgias. Negative for myalgias.  Skin:  Negative for rash.  Neurological:  Negative for numbness and headaches.  Hematological:  Negative for  adenopathy.     Objective:    BP 136/78 (BP Location: Right Arm, Patient Position: Sitting, Cuff Size: Normal)   Pulse 94   Temp 98.3 F (36.8 C) (Oral)   Ht 5' 4"  (1.626 m)   Wt 186 lb 3.2 oz (84.5 kg)   SpO2 98%   BMI 31.96 kg/m  BP Readings from Last 3 Encounters:  08/13/21 136/78  08/02/21 127/77  07/27/21 115/78   Wt Readings from Last 3 Encounters:  08/13/21 186 lb 3.2 oz (84.5 kg)  08/02/21 186 lb (84.4 kg)  07/27/21 186 lb (84.4 kg)    Physical Exam Vitals reviewed.  Constitutional:      Appearance: He is well-developed.  Cardiovascular:     Rate and Rhythm: Regular rhythm.     Heart sounds: Normal heart sounds.  Pulmonary:     Effort: Pulmonary effort is normal. No respiratory distress.     Breath sounds: Normal breath sounds. No wheezing, rhonchi or rales.  Musculoskeletal:     Right wrist: Normal. No swelling. Normal range of motion.     Left wrist: Normal. No swelling. Normal range of motion.     Right hand: No swelling. Normal range of motion. Decreased strength.     Left hand: No swelling. Normal range of motion. Decreased strength.     Comments: Decreased grip strength bilaterally.  No edema, erythema, increased warmth  Skin:    General: Skin is warm and dry.  Neurological:     Mental Status: He is alert.  Psychiatric:        Speech: Speech normal.        Behavior: Behavior normal.       Assessment & Plan:   Problem List Items Addressed This Visit       Cardiovascular and Mediastinum   Hypertension - Primary   Relevant Orders   POCT HgB A1C (Completed)     Endocrine   Diabetes mellitus without complication (HCC)    Lab Results  Component Value Date   HGBA1C 7.9 (A) 08/13/2021  Uncontrolled.  Discussed patient's dietary indiscretion with Pepsi and slushie's.  Counseled on the importance of abstaining from both of these and I suspect that if he would make this lifestyle change his A1c would approach goal < 7.  We discussed retrial of  metformin however he is experienced GI intolerance with metformin.  Consider Jardiance however with patient's chronic dizziness, I would prefer to manage with lifestyle changes.  He is agreeable to this.  We will continue insulin glargine 9 units at bedtime, insulin lispro 4 units twice daily with meals, semaglutide 2 mg weekly.        Other   Bilateral hand pain    New. Presentation consistent with osteoarthritis.  Pending inflammatory markers as well.  Patient will trial scheduling Tylenol arthritis as he is unable to take NSAIDs as he is currently on Plavix.  If no improvement consider x-rays and/or consult with Dr. Peggye Ley, [facility].  He will let me know how he is doing       Relevant Orders   ANA   C-reactive protein   Rheumatoid factor   Sedimentation rate   CYCLIC CITRUL PEPTIDE ANTIBODY, IGG/IGA   Comprehensive metabolic panel   Dizziness    Improved over time.  No dizziness in the office today.  Has upcoming appointment with vascular neurosurgeon.  He will remain compliant with midodrine.         I am having Quintavis R. Milledge maintain his sildenafil, blood glucose meter kit and supplies, nystatin-triamcinolone ointment, atorvastatin, clopidogrel, aspirin EC, albuterol, Semaglutide (2 MG/DOSE), insulin lispro, Insulin Pen Needle, insulin glargine, triamcinolone cream, Vitamin D, digoxin, pantoprazole, fluticasone, midodrine, and FreeStyle Libre 2 Sensor.   No orders of the defined types were placed in this encounter.   Return precautions given.   Risks, benefits, and alternatives of the medications and treatment plan prescribed today were discussed, and patient expressed understanding.   Education regarding symptom management and diagnosis given to patient on AVS.  Continue to follow with Burnard Hawthorne, [name] for routine health maintenance.   Syre Rennie Natter and I agreed with  plan.   [name], [name]

## 2021-08-13 NOTE — Telephone Encounter (Signed)
Call pt  I had meant to discuss with him the drug interaction between digoxin and pantoprazole.    If he misses medication does he have epigastric burning or symptoms of reflux?    Has he tried Pepcid AC instead?    Pepcid AC is over-the-counter and he can take pepcid 20 mg tablet once daily or just take as needed.  Otherwise if he does not think he needs pantoprazole, I would advise that he discontinues and please DC from his chart

## 2021-08-13 NOTE — Progress Notes (Signed)
a1c

## 2021-08-13 NOTE — Assessment & Plan Note (Addendum)
New. Presentation consistent with osteoarthritis.  Pending inflammatory markers as well.  Patient will trial scheduling Tylenol arthritis as he is unable to take NSAIDs as he is currently on Plavix.  If no improvement consider x-rays and/or consult with Dr. Peggye Ley, [facility].  He will let me know how he is doing

## 2021-08-13 NOTE — Assessment & Plan Note (Signed)
Improved over time.  No dizziness in the office today.  Has upcoming appointment with vascular neurosurgeon.  He will remain compliant with midodrine.

## 2021-08-13 NOTE — Assessment & Plan Note (Signed)
Lab Results  Component Value Date   HGBA1C 7.9 (A) 08/13/2021   Uncontrolled.  Discussed patient's dietary indiscretion with Pepsi and slushie's.  Counseled on the importance of abstaining from both of these and I suspect that if he would make this lifestyle change his A1c would approach goal < 7.  We discussed retrial of metformin however he is experienced GI intolerance with metformin.  Consider Jardiance however with patient's chronic dizziness, I would prefer to manage with lifestyle changes.  He is agreeable to this.  We will continue insulin glargine 9 units at bedtime, insulin lispro 4 units twice daily with meals, semaglutide 2 mg weekly.

## 2021-08-14 ENCOUNTER — Other Ambulatory Visit: Payer: Self-pay | Admitting: Internal Medicine

## 2021-08-14 ENCOUNTER — Other Ambulatory Visit: Payer: Self-pay

## 2021-08-14 DIAGNOSIS — E785 Hyperlipidemia, unspecified: Secondary | ICD-10-CM

## 2021-08-14 LAB — C-REACTIVE PROTEIN: CRP: <1 mg/dL (ref 0.5–20.0)

## 2021-08-14 LAB — RHEUMATOID FACTOR: Rheumatoid fact SerPl-aCnc: <14 IU/mL (ref <14)

## 2021-08-14 LAB — ANA: Anti Nuclear Antibody (ANA): NEGATIVE

## 2021-08-14 MED ORDER — FAMOTIDINE 20 MG PO TABS: 20.0000 mg | ORAL_TABLET | Freq: Two times a day (BID) | ORAL | Status: DC

## 2021-08-14 NOTE — Telephone Encounter (Signed)
I spoke with patient & he stated that since he started the pantoprazole that he had not had any of belching, bloat or gassiness he'd had in the past. He was willing to try the famotidine for now & let us know if his sx returned. I will add the famotidine to his chart.

## 2021-08-15 LAB — CYCLIC CITRUL PEPTIDE ANTIBODY, IGG/IGA: Cyclic Citrullin Peptide Ab: 5 units (ref 0–19)

## 2021-08-15 NOTE — Telephone Encounter (Signed)
noted 

## 2021-08-16 DIAGNOSIS — R42 Dizziness and giddiness: Secondary | ICD-10-CM | POA: Diagnosis not present

## 2021-08-16 DIAGNOSIS — I679 Cerebrovascular disease, unspecified: Secondary | ICD-10-CM | POA: Diagnosis not present

## 2021-08-20 ENCOUNTER — Other Ambulatory Visit: Payer: Self-pay

## 2021-08-20 ENCOUNTER — Encounter: Payer: Self-pay | Admitting: Family

## 2021-08-20 DIAGNOSIS — M79641 Pain in right hand: Secondary | ICD-10-CM

## 2021-08-21 DIAGNOSIS — M65332 Trigger finger, left middle finger: Secondary | ICD-10-CM | POA: Diagnosis not present

## 2021-08-21 DIAGNOSIS — M79645 Pain in left finger(s): Secondary | ICD-10-CM | POA: Diagnosis not present

## 2021-08-24 ENCOUNTER — Ambulatory Visit (INDEPENDENT_AMBULATORY_CARE_PROVIDER_SITE_OTHER): Payer: Medicare Other | Admitting: *Deleted

## 2021-08-24 DIAGNOSIS — E119 Type 2 diabetes mellitus without complications: Secondary | ICD-10-CM

## 2021-08-24 DIAGNOSIS — R000 Tachycardia, unspecified: Secondary | ICD-10-CM

## 2021-08-24 DIAGNOSIS — I10 Essential (primary) hypertension: Secondary | ICD-10-CM

## 2021-08-24 NOTE — Patient Instructions (Addendum)
Visit Information  Thank you for taking time to visit with me today. Please don't hesitate to contact me if I can be of assistance to you before our next scheduled telephone appointment.  Following are the goals we discussed today:  Take all medications as prescribed Attend all scheduled provider appointments Perform all self care activities independently  Call provider office for new concerns or questions  check blood sugar at prescribed times: 4 times daily enter blood sugar readings and medication or insulin into daily log fill half of plate with vegetables manage portion size set a realistic goal check blood pressure weekly write blood pressure results in a log or diary learn about high blood pressure take blood pressure log to all doctor appointments call doctor for signs and symptoms of high blood pressure keep all doctor appointments call doctor when you experience any new symptoms  Our next appointment is by telephone on 7/26 at 1030  Please call the care guide team at [phone] if you need to cancel or reschedule your appointment.   If you are experiencing a Mental Health or [place] or need someone to talk to, please call the Suicide and Crisis Lifeline: 988 call the [place] National Suicide Prevention Lifeline: (504)499-5385 or TTY: 303-713-1082 TTY 754-471-6175) to talk to a trained counselor call 1-800-273-TALK (toll free, 24 hour hotline) call 911   Patient verbalizes understanding of instructions and care plan provided today and agrees to view in [facility]. Active MyChart status and patient understanding of how to access instructions and care plan via MyChart confirmed with patient.     Hubert Azure RN, MSN RN Care Management Coordinator [place] [phone] [email or web address]

## 2021-08-24 NOTE — Chronic Care Management (AMB) (Signed)
Chronic Care Management   CCM RN Visit Note  08/24/2021 Name: Benjamin Conley MRN: [id number] DOB: 10-31-1965  Subjective: Benjamin Conley is a 56 y.o. year old male who is a primary care patient of Burnard Hawthorne, [name]. The care management team was consulted for assistance with disease management and care coordination needs.    Engaged with patient by telephone for follow up visit in response to provider referral for case management and/or care coordination services.   Consent to Services:  The patient was given information about Chronic Care Management services, agreed to services, and gave verbal consent prior to initiation of services.  Please see initial visit note for detailed documentation.   Patient agreed to services and verbal consent obtained.   Assessment: Review of patient past medical history, allergies, medications, health status, including review of consultants reports, laboratory and other test data, was performed as part of comprehensive evaluation and provision of chronic care management services.   SDOH (Social Determinants of Health) assessments and interventions performed:    CCM Care Plan  Allergies  Allergen Reactions   Wasp Venom Anaphylaxis   Wasp Venom Protein Anaphylaxis   Coffee Flavor Nausea And Vomiting    Patient states coffee gives him nausea and stomach cramps   Onion Nausea And Vomiting    Outpatient Encounter Medications as of 08/24/2021  Medication Sig   albuterol (VENTOLIN HFA) 108 (90 Base) MCG/ACT inhaler Inhale 2 puffs into the lungs every 6 (six) hours as needed for wheezing or shortness of breath.   aspirin 81 MG EC tablet Take 1 tablet (81 mg total) by mouth daily.   atorvastatin (LIPITOR) 40 MG tablet Take 1 tablet by mouth once daily   blood glucose meter kit and supplies Dispense based on patient and insurance preference. Use up to four times daily as directed. (FOR ICD-10 E10.9, E11.9).   Cholecalciferol (VITAMIN D) 125 MCG  (5000 UT) CAPS Take 1 capsule by mouth daily.   clopidogrel (PLAVIX) 75 MG tablet Take 1 tablet (75 mg total) by mouth daily.   Continuous Blood Gluc Sensor (FREESTYLE LIBRE 2 SENSOR) MISC 1 each by Does not apply route every 14 (fourteen) days.   digoxin (LANOXIN) 0.125 MG tablet Take 1 tablet (0.125 mg total) by mouth daily.   famotidine (PEPCID) 20 MG tablet Take 1 tablet (20 mg total) by mouth 2 (two) times daily.   fluticasone (FLONASE) 50 MCG/ACT nasal spray Place 2 sprays into both nostrils daily.   insulin glargine (LANTUS) 100 UNIT/ML Solostar Pen Inject 9 Units into the skin at bedtime.   insulin lispro (HUMALOG KWIKPEN) 100 UNIT/ML KwikPen Inject 4 Units into the skin 2 (two) times daily with a meal. Lunch and dinner   Insulin Pen Needle 32G X 4 MM MISC Use as directed to administer insulin   midodrine (PROAMATINE) 5 MG tablet Take 1.5 tablets (7.5 mg total) by mouth 3 (three) times daily with meals.   nystatin-triamcinolone ointment (MYCOLOG) Apply 1 application topically 2 (two) times daily.   Semaglutide, 2 MG/DOSE, 8 MG/3ML SOPN Inject 2 mg as directed once a week.   sildenafil (VIAGRA) 100 MG tablet Take 1 tablet (100 mg total) by mouth daily as needed for erectile dysfunction.   triamcinolone cream (KENALOG) 0.1 % Apply 1 application topically 2 (two) times daily.   No facility-administered encounter medications on file as of 08/24/2021.    Patient Active Problem List   Diagnosis Date Noted   Bilateral hand pain 08/13/2021  Dizziness 06/11/2021   Sinus congestion 06/11/2021   Pulmonary emphysema ([place]) 05/07/2021   GERD (gastroesophageal reflux disease) 01/12/2021   Screening for blood or protein in urine 06/16/2020   Contact dermatitis 06/16/2020   Leukocytosis 01/30/2020   Nicotine use disorder 01/27/2020   Slow transit constipation 01/27/2020   Nail avulsion of toe, initial encounter 12/09/2019   Benign prostatic hyperplasia with hesitancy 10/30/2019   Abnormal  ejaculation 10/21/2019   B12 deficiency 10/21/2019   Rectal pressure 10/21/2019   Thrombosis of splenic artery (HCC) 10/03/2019   Abnormal brain MRI 09/22/2019   Difficulty sleeping 09/22/2019   Tinnitus of both ears 09/22/2019   Pain due to onychomycosis of toenails of both feet 09/06/2019   Coagulation disorder ([place]) 09/06/2019   Iron deficiency anemia due to chronic blood loss 08/29/2019   Thrombocytosis 08/24/2019   Angiodysplasia of intestinal tract    Black stool 08/22/2019   Presence of arterial stent - splenic artery 08/21/2019   Orthostatic hypotension 08/19/2019   Tachycardia 08/19/2019   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   COPD with chronic bronchitis ([place]) 08/01/2019   Tobacco abuse 08/01/2019   Diabetes mellitus without complication ([place])    Hyperlipidemia    Impingement syndrome of shoulder region 03/30/2018   Hypertension 11/10/2013    Conditions to be addressed/monitored:HTN and DMII  Care Plan : [place] (Adult)  Updates made by Leona Singleton, RN since 08/24/2021 12:00 AM     Problem: KNOWLEDGE DEFICIT RELATED TO SELF CARE MANAGEMENT OF CHRONIC CONDITIONS   Priority: Medium     Goal: PATIENT WILL WORK WITH CCM TEAM TO GAIN KNOWLEDGE TO HELP CARE FOR [place]   Start Date: 07/18/2021  Expected End Date: 07/18/2022  Priority: Medium  Note:   Current Barriers:  Knowledge Deficits related to plan of care for management of HTN, HLD, and DMII  Chronic Disease Management support and education needs related to HTN, HLD, and DMII  5/10--fasting blood sugar 153 this morning with recent ranges of 150-170's.  Does report occasional hypoglycemia episode 6/16--A1C actually increased.  Fasting blood sugar 140 with recent ranges elevated 150-200's related cortisol injection in booth hands.  States hands feel better after injections.  BP 115/74 and patient states it has ranged good.  Denies any SOB,, CP, dizziness or swelling.  RNCM Clinical Goal(s):   Patient will verbalize basic understanding of  HTN, HLD, and DMII disease process and self health management plan as evidenced by DECREASING HGB A1C BY 0.2 POINTS demonstrate Improved health management independence as evidenced by MONITORING BPS, DECREASING A1C, INCREASING FRUITS AND VEGETABLES IN DIET  through collaboration with RN Care manager, provider, and care team.   Interventions: 1:1 collaboration with primary care provider regarding development and update of comprehensive plan of care as evidenced by provider attestation and co-signature Inter-disciplinary care team collaboration (see longitudinal plan of care) Evaluation of current treatment plan related to  self management and patient's adherence to plan as established by provider  Diabetes Interventions:  (Status:  New goal. and Goal on track:  NO.) Long Term Goal Assessed patient's understanding of A1c goal: <6.5% Provided education to patient about basic DM disease process Reviewed medications with patient and discussed importance of medication adherence Counseled on importance of regular laboratory monitoring as prescribed Discussed plans with patient for ongoing care management follow up and provided patient with direct contact information for care management team Provided patient with written educational materials related to hypo and hyperglycemia and importance of correct  treatment Advised patient, providing education and rationale, to check cbg qid and record, calling PCP for findings outside established parameters Assessed social determinant of health barriers Discussed low carb heart healthy diet Discussed slight increase in current Hgb A1C and ways to help reduce it and blood sugars Lab Results  Component Value Date   HGBA1C 7.9 (A) 08/13/2021   Hypertension Interventions:  (Status:  Goal on track:  Yes.) Long Term Goal Last practice recorded BP readings:  BP Readings from Last 3 Encounters:  08/13/21 136/78   08/02/21 127/77  07/27/21 115/78  Most recent eGFR/CrCl:  Lab Results  Component Value Date   EGFR 105 02/23/2021    No components found for: "CRCL"  Evaluation of current treatment plan related to hypertension self management and patient's adherence to plan as established by provider Reviewed medications with patient and discussed importance of compliance Counseled on the importance of exercise goals with target of 150 minutes per week Discussed plans with patient for ongoing care management follow up and provided patient with direct contact information for care management team Provided education on prescribed diet low carb heart healthy Discussed complications of poorly controlled blood pressure such as heart disease, stroke, circulatory complications, vision complications, kidney impairment, sexual dysfunction Screening for signs and symptoms of depression related to chronic disease state  Encouraged to monitor blood pressures at least weekly for hypo and hypertension Discussed and encouraged eating healthier meal options; encouraged to try new vegetable every week  Patient Goals/Self-Care Activities: Take all medications as prescribed Attend all scheduled provider appointments Perform all self care activities independently  Call provider office for new concerns or questions  check blood sugar at prescribed times: 4 times daily enter blood sugar readings and medication or insulin into daily log fill half of plate with vegetables manage portion size set a realistic goal check blood pressure weekly write blood pressure results in a log or diary learn about high blood pressure take blood pressure log to all doctor appointments call doctor for signs and symptoms of high blood pressure keep all doctor appointments call doctor when you experience any new symptoms  Follow Up Plan:  The care management team will reach out to the patient again over the next 45 days.      Plan:The  care management team will reach out to the patient again over the next 45 days.  Hubert Azure RN, MSN RN Care Management Coordinator [place] [phone] [email or web address]

## 2021-08-30 ENCOUNTER — Encounter: Payer: Self-pay | Admitting: Cardiology

## 2021-08-30 ENCOUNTER — Ambulatory Visit (INDEPENDENT_AMBULATORY_CARE_PROVIDER_SITE_OTHER): Payer: Medicare Other | Admitting: Cardiology

## 2021-08-30 VITALS — BP 120/80 | HR 98 | Ht 64.0 in | Wt 181.0 lb

## 2021-08-30 DIAGNOSIS — R000 Tachycardia, unspecified: Secondary | ICD-10-CM | POA: Diagnosis not present

## 2021-08-30 DIAGNOSIS — E782 Mixed hyperlipidemia: Secondary | ICD-10-CM | POA: Diagnosis not present

## 2021-08-30 DIAGNOSIS — I951 Orthostatic hypotension: Secondary | ICD-10-CM | POA: Diagnosis not present

## 2021-08-30 NOTE — Progress Notes (Signed)
Cardiology Office Note:    Date:  08/30/2021   ID:  Benjamin Conley, DOB 1966/01/30, MRN [id number]  PCP:  Burnard Hawthorne, [name]  [facility] Cardiologist:  [name], MD  [place] Electrophysiologist:  None   Referring MD: Burnard Hawthorne, [name]   Chief Complaint  Patient presents with   Follow-up    History of Present Illness:    Benjamin Conley is a 56 y.o. male with a hx of diabetes, orthostatic hypotension, inappropriate sinus tach, splenic infarct s/p stent placement, COPD, hyperlipidemia, former smoker x40+ years who presents for follow-up.   Being seen for orthostatic hypotension, started on midodrine and titrated to 7.5 mg 3 times daily.  Symptoms have improved although still present but not as severe as before.  Denies chest pain.  Evaluated by neurology, posterior cerebral circulation issues being considered.  Otherwise states doing okay.   Prior notes Echo 08/7670 normal systolic and diastolic function, EF 55 to 60%. Lexiscan Myoview 08/2019 low risk, no evidence for ischemia, no significant coronary artery calcification Cardiac monitor on May/2022, no significant arrhythmias.  Patient had worsening left upper quadrant abdominal pain.  He presented to the ED  where CT abdomen dated 07/2019 showed distal splenic aneurysm no greater than 6 mm, small dissection, likely source of splenic infarctions.  He was diagnosed with splenic infarct and had a stent placed to the splenic artery.    Denies palpitations or irregular heartbeats.  States his heart rates have been elevated over the past 2 to 3 weeks, up to 120 bpm upon checks at home.   Past Medical History:  Diagnosis Date   Acute posthemorrhagic anemia    Complication of anesthesia    c/o difficulty breathing after anesthesia   COPD (chronic obstructive pulmonary disease) (HCC)    Diabetes mellitus without complication ([place])    Hyperlipemia    Melena 08/21/2019   Paroxysmal supraventricular  tachycardia (HCC) 08/19/2019   Postural dizziness with presyncope 08/21/2019   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   Tobacco abuse    Upper GI bleed 08/21/2019    Past Surgical History:  Procedure Laterality Date   BACK SURGERY     lumbar   COLONOSCOPY WITH PROPOFOL N/A 08/24/2019   Procedure: COLONOSCOPY WITH PROPOFOL;  Surgeon: [name], MD;  Location: [facility] ENDOSCOPY;  Service: Endoscopy;  Laterality: N/A;   ESOPHAGOGASTRODUODENOSCOPY (EGD) WITH PROPOFOL N/A 08/24/2019   Procedure: ESOPHAGOGASTRODUODENOSCOPY (EGD) WITH PROPOFOL;  Surgeon: [name], MD;  Location: [facility] ENDOSCOPY;  Service: Endoscopy;  Laterality: N/A;   STENT PLACEMENT VASCULAR ([place] [facility])  07/2019   stenosis of distal splenic artery and stent placed   VISCERAL ANGIOGRAPHY N/A 08/06/2019   Procedure: VISCERAL ANGIOGRAPHY;  Surgeon: [name], MD;  Location: [facility] LAB;  Service: Cardiovascular;  Laterality: N/A;    Current Medications: Current Meds  Medication Sig   albuterol (VENTOLIN HFA) 108 (90 Base) MCG/ACT inhaler Inhale 2 puffs into the lungs every 6 (six) hours as needed for wheezing or shortness of breath.   aspirin 81 MG EC tablet Take 1 tablet (81 mg total) by mouth daily.   atorvastatin (LIPITOR) 40 MG tablet Take 1 tablet by mouth once daily   blood glucose meter kit and supplies Dispense based on patient and insurance preference. Use up to four times daily as directed. (FOR ICD-10 E10.9, E11.9).   Cholecalciferol (VITAMIN D) 125 MCG (5000 UT) CAPS Take 1 capsule by mouth daily.   clopidogrel (PLAVIX) 75 MG tablet Take  1 tablet (75 mg total) by mouth daily.   Continuous Blood Gluc Sensor (FREESTYLE LIBRE 2 SENSOR) MISC 1 each by Does not apply route every 14 (fourteen) days.   digoxin (LANOXIN) 0.125 MG tablet Take 1 tablet (0.125 mg total) by mouth daily.   famotidine (PEPCID) 20 MG tablet Take 1 tablet (20 mg total) by mouth 2 (two) times daily.   fluticasone (FLONASE) 50 MCG/ACT nasal spray  Place 2 sprays into both nostrils daily.   insulin glargine (LANTUS) 100 UNIT/ML Solostar Pen Inject 9 Units into the skin at bedtime.   insulin lispro (HUMALOG KWIKPEN) 100 UNIT/ML KwikPen Inject 4 Units into the skin 2 (two) times daily with a meal. Lunch and dinner   Insulin Pen Needle 32G X 4 MM MISC Use as directed to administer insulin   midodrine (PROAMATINE) 5 MG tablet Take 1.5 tablets (7.5 mg total) by mouth 3 (three) times daily with meals.   nystatin-triamcinolone ointment (MYCOLOG) Apply 1 application topically 2 (two) times daily.   Semaglutide, 2 MG/DOSE, 8 MG/3ML SOPN Inject 2 mg as directed once a week.   sildenafil (VIAGRA) 100 MG tablet Take 1 tablet (100 mg total) by mouth daily as needed for erectile dysfunction.   triamcinolone cream (KENALOG) 0.1 % Apply 1 application topically 2 (two) times daily.     Allergies:   Wasp venom, Wasp venom protein, Coffee flavor, and Onion   Social History   Socioeconomic History   Marital status: Single    Spouse name: Not on file   Number of children: Not on file   Years of education: Not on file   Highest education level: Not on file  Occupational History   Not on file  Tobacco Use   Smoking status: Former    Packs/day: 1.00    Years: 48.00    Total pack years: 48.00    Types: Cigarettes    Quit date: 02/12/2020    Years since quitting: 1.5   Smokeless tobacco: Never  Vaping Use   Vaping Use: Never used  Substance and Sexual Activity   Alcohol use: No    Comment: Never been a problem   Drug use: No   Sexual activity: Yes  Other Topics Concern   Not on file  Social History Narrative   Single, lives alone, unemployed.   Social Determinants of Health   Financial Resource Strain: Low Risk  (07/18/2021)   Overall Financial Resource Strain (CARDIA)    Difficulty of Paying Living Expenses: Not hard at all  Food Insecurity: No Food Insecurity (07/18/2021)   Hunger Vital Sign    Worried About Running Out of Food in the  Last Year: Never true    Ran Out of Food in the Last Year: Never true  Transportation Needs: No Transportation Needs (07/18/2021)   PRAPARE - Hydrologist (Medical): No    Lack of Transportation (Non-Medical): No  Physical Activity: Inactive (07/18/2021)   Exercise Vital Sign    Days of Exercise per Week: 0 days    Minutes of Exercise per Session: 0 min  Stress: No Stress Concern Present (07/18/2021)   [place]    Feeling of Stress : Not at all  Social Connections: Unknown (10/19/2020)   Social Connection and Isolation Panel [NHANES]    Frequency of Communication with Friends and Family: Not on file    Frequency of Social Gatherings with Friends and Family: More than three times  a week    Attends Religious Services: Not on file    Active Member of Clubs or Organizations: No    Attends Archivist Meetings: Not on file    Marital Status: Not on file     Family History: The patient's family history includes Cancer in his maternal aunt; Colon cancer in his maternal uncle; Diabetes in his brother, mother, sister, sister, sister, and sister; Diabetes Mellitus II in his brother and mother; Heart disease in his father; Hyperlipidemia in his brother; Tuberculosis in his father. There is no history of Prostate cancer.  ROS:   Please see the history of present illness.     All other systems reviewed and are negative.  EKGs/Labs/Other Studies Reviewed:    The following studies were reviewed today:   EKG:  EKG is ordered today.  EKG shows normal sinus rhythm.  Recent Labs: 02/23/2021: Magnesium 2.1; TSH 1.370 03/08/2021: Pro B Natriuretic peptide (BNP) 34.0 05/07/2021: Hemoglobin 12.1; Platelets 443 08/13/2021: ALT 17; BUN 12; Creatinine, Ser 0.71; Potassium 3.8; Sodium 141  Recent Lipid Panel    Component Value Date/Time   CHOL 138 02/23/2021 1531   TRIG 126 02/23/2021 1531   HDL 41  02/23/2021 1531   CHOLHDL 3.4 02/23/2021 1531   CHOLHDL 2.6 01/21/2020 1505   VLDL 28.4 10/21/2019 1432   LDLCALC 74 02/23/2021 1531   LDLCALC 40 01/21/2020 1505    Physical Exam:    VS:  BP 120/80 (BP Location: Right Arm, Patient Position: Sitting, Cuff Size: Large)   Pulse 98   Ht _0  (1.626 m)   Wt 181 lb (82.1 kg)   SpO2 99%   BMI 31.07 kg/m     Wt Readings from Last 3 Encounters:  08/30/21 181 lb (82.1 kg)  08/13/21 186 lb 3.2 oz (84.5 kg)  08/02/21 186 lb (84.4 kg)     GEN:  Well nourished, well developed in no acute distress HEENT: Normal NECK: No JVD; No carotid bruits CARDIAC: Tachycardic, regular, no murmurs, rubs, gallops RESPIRATORY:  Clear to auscultation without rales, wheezing or rhonchi  ABDOMEN: Soft, non-tender, non-distended MUSCULOSKELETAL:  No edema; No deformity  SKIN: Warm and dry NEUROLOGIC:  Alert and oriented x 3 PSYCHIATRIC:  Normal affect   ASSESSMENT:    1. Orthostatic hypotension   2. Inappropriate sinus tachycardia   3. Mixed hyperlipidemia    PLAN:    In order of problems listed above:  Orthostatic hypotension, symptoms improved with starting midodrine.  Continue midodrine to 7.5 mg 3 times daily.  Other etiologies being considered by neurology. Inappropriate sinus tachycardia, improved/resolved continue digoxin 0.125 daily.   Hyperlipidemia, cholesterol controlled, continue Lipitor  Follow-up 6 months.  Medication Adjustments/Labs and Tests Ordered: Current medicines are reviewed at length with the patient today.  Concerns regarding medicines are outlined above.  Orders Placed This Encounter  Procedures   EKG 12-Lead    No orders of the defined types were placed in this encounter.    Patient Instructions  Medication Instructions:  Your physician recommends that you continue on your current medications as directed. Please refer to the Current Medication list given to you today.  *If you need a refill on your cardiac  medications before your next appointment, please call your pharmacy*    Follow-Up: At [facility], you and your health needs are our priority.  As part of our continuing mission to provide you with exceptional heart care, we have created designated Provider Care Teams.  These Care Teams  include your primary Cardiologist (physician) and Advanced Practice Providers (APPs -  Physician Assistants and Nurse Practitioners) who all work together to provide you with the care you need, when you need it.  We recommend signing up for the patient portal called "MyChart".  Sign up information is provided on this After Visit Summary.  MyChart is used to connect with patients for Virtual Visits (Telemedicine).  Patients are able to view lab/test results, encounter notes, upcoming appointments, etc.  Non-urgent messages can be sent to your provider as well.   To learn more about what you can do with MyChart, go to [email or web address].    Your next appointment:   Follow up 6 months   The format for your next appointment:   In Person  Provider:   You may see [name], MD or one of the following Advanced Practice Providers on your designated Care Team:   Murray Hodgkins, NP Christell Faith, PA-C Cadence Kathlen Mody, [place]    Other Instructions   Important Information About Sugar         Signed, [name], MD  08/30/2021 11:58 AM    [place]

## 2021-08-30 NOTE — Patient Instructions (Signed)
Medication Instructions:  Your physician recommends that you continue on your current medications as directed. Please refer to the Current Medication list given to you today.  *If you need a refill on your cardiac medications before your next appointment, please call your pharmacy*    Follow-Up: At [facility], you and your health needs are our priority.  As part of our continuing mission to provide you with exceptional heart care, we have created designated Provider Care Teams.  These Care Teams include your primary Cardiologist (physician) and Advanced Practice Providers (APPs -  Physician Assistants and Nurse Practitioners) who all work together to provide you with the care you need, when you need it.  We recommend signing up for the patient portal called "MyChart".  Sign up information is provided on this After Visit Summary.  MyChart is used to connect with patients for Virtual Visits (Telemedicine).  Patients are able to view lab/test results, encounter notes, upcoming appointments, etc.  Non-urgent messages can be sent to your provider as well.   To learn more about what you can do with MyChart, go to [email or web address].    Your next appointment:   Follow up 6 months   The format for your next appointment:   In Person  Provider:   You may see [name], MD or one of the following Advanced Practice Providers on your designated Care Team:   Murray Hodgkins, NP Christell Faith, PA-C Cadence Kathlen Mody, [place]    Other Instructions   Important Information About Sugar

## 2021-09-07 DIAGNOSIS — I10 Essential (primary) hypertension: Secondary | ICD-10-CM | POA: Diagnosis not present

## 2021-09-07 DIAGNOSIS — E1159 Type 2 diabetes mellitus with other circulatory complications: Secondary | ICD-10-CM

## 2021-09-07 DIAGNOSIS — E119 Type 2 diabetes mellitus without complications: Secondary | ICD-10-CM

## 2021-09-09 DIAGNOSIS — R42 Dizziness and giddiness: Secondary | ICD-10-CM | POA: Diagnosis not present

## 2021-09-12 ENCOUNTER — Ambulatory Visit: Payer: Self-pay | Admitting: *Deleted

## 2021-09-12 DIAGNOSIS — I10 Essential (primary) hypertension: Secondary | ICD-10-CM

## 2021-09-12 DIAGNOSIS — E119 Type 2 diabetes mellitus without complications: Secondary | ICD-10-CM

## 2021-09-12 NOTE — Patient Instructions (Signed)
CONGRATULATIONS ON COMPLETING YOUR GOALS.  IT AS BEEN A PLEASURE WORKING WITH AND TALKING TO YOU.  IF  NEEDS [place] IN THE FUTURE PLEASE DO NOT HESITATE TO CONTACT ME  [phone]  Naryah Clenney RN, MSN RN Care Management Coordinator [facility] [phone] [email or web address]  

## 2021-09-12 NOTE — Chronic Care Management (AMB) (Signed)
  Care Management   Follow Up Note   09/12/2021 Name: Benjamin Conley MRN: [id number] DOB: Oct 04, 1965   Referred by: Burnard Hawthorne, [name] Reason for referral : Case Closure   Successful outreach to patient.  States she is doing well without complaints.  Discussed goals and both agree patient has met goals of the program.  Follow Up Plan: The patient has been provided with contact information for the care management team and has been advised to call with any health-related questions or concerns.  No further follow up required: as personal goals have been met.  Hubert Azure RN, MSN RN Care Management Coordinator [place] [phone] [email or web address]

## 2021-09-13 ENCOUNTER — Encounter: Payer: Self-pay | Admitting: Family

## 2021-09-13 ENCOUNTER — Ambulatory Visit: Payer: Medicare Other | Attending: Pulmonary Disease

## 2021-09-13 DIAGNOSIS — J432 Centrilobular emphysema: Secondary | ICD-10-CM | POA: Insufficient documentation

## 2021-09-13 DIAGNOSIS — H538 Other visual disturbances: Secondary | ICD-10-CM | POA: Diagnosis not present

## 2021-09-13 DIAGNOSIS — Z87891 Personal history of nicotine dependence: Secondary | ICD-10-CM | POA: Diagnosis not present

## 2021-09-13 DIAGNOSIS — R0600 Dyspnea, unspecified: Secondary | ICD-10-CM | POA: Diagnosis not present

## 2021-09-13 DIAGNOSIS — H93A9 Pulsatile tinnitus, unspecified ear: Secondary | ICD-10-CM | POA: Diagnosis not present

## 2021-09-13 DIAGNOSIS — R519 Headache, unspecified: Secondary | ICD-10-CM | POA: Diagnosis not present

## 2021-09-13 DIAGNOSIS — R42 Dizziness and giddiness: Secondary | ICD-10-CM | POA: Diagnosis not present

## 2021-09-13 LAB — PULMONARY FUNCTION TEST ARMC ONLY
DL/VA % pred: 84 %
DL/VA: 3.76 ml/min/mmHg/L
DLCO unc % pred: 73 %
DLCO unc: 17.19 ml/min/mmHg
FEF 25-75 Post: 1.65 L/sec
FEF 25-75 Pre: 2.45 L/sec
FEF2575-%Change-Post: -32 %
FEF2575-%Pred-Post: 61 %
FEF2575-%Pred-Pre: 90 %
FEV1-%Change-Post: -12 %
FEV1-%Pred-Post: 78 %
FEV1-%Pred-Pre: 89 %
FEV1-Post: 2.38 L
FEV1-Pre: 2.71 L
FEV1FVC-%Change-Post: -9 %
FEV1FVC-%Pred-Pre: 97 %
FEV6-%Change-Post: -3 %
FEV6-%Pred-Post: 92 %
FEV6-%Pred-Pre: 95 %
FEV6-Post: 3.5 L
FEV6-Pre: 3.62 L
FEV6FVC-%Change-Post: 0 %
FEV6FVC-%Pred-Post: 104 %
FEV6FVC-%Pred-Pre: 104 %
FVC-%Change-Post: -2 %
FVC-%Pred-Post: 88 %
FVC-%Pred-Pre: 91 %
FVC-Post: 3.52 L
FVC-Pre: 3.62 L
Post FEV1/FVC ratio: 68 %
Post FEV6/FVC ratio: 99 %
Pre FEV1/FVC ratio: 75 %
Pre FEV6/FVC Ratio: 100 %
RV % pred: 76 %
RV: 1.39 L
TLC % pred: 88 %
TLC: 5.11 L

## 2021-09-13 MED ORDER — ALBUTEROL SULFATE (2.5 MG/3ML) 0.083% IN NEBU
2.5000 mg | INHALATION_SOLUTION | Freq: Once | RESPIRATORY_TRACT | Status: AC
  Administered 2021-09-13: 2.5 mg via RESPIRATORY_TRACT
  Filled 2021-09-13: qty 3

## 2021-09-18 NOTE — Telephone Encounter (Signed)
Spoke to patient and scheduled him on 09/24/21 for f/u

## 2021-09-24 ENCOUNTER — Encounter: Payer: Self-pay | Admitting: Family

## 2021-09-24 ENCOUNTER — Ambulatory Visit (INDEPENDENT_AMBULATORY_CARE_PROVIDER_SITE_OTHER): Payer: Medicare Other | Admitting: Family

## 2021-09-24 ENCOUNTER — Ambulatory Visit (INDEPENDENT_AMBULATORY_CARE_PROVIDER_SITE_OTHER): Payer: Medicare Other

## 2021-09-24 ENCOUNTER — Other Ambulatory Visit: Payer: Medicare Other

## 2021-09-24 VITALS — BP 121/88 | HR 104 | Temp 97.6°F | Ht 64.0 in | Wt 180.4 lb

## 2021-09-24 DIAGNOSIS — E119 Type 2 diabetes mellitus without complications: Secondary | ICD-10-CM | POA: Diagnosis not present

## 2021-09-24 DIAGNOSIS — I951 Orthostatic hypotension: Secondary | ICD-10-CM | POA: Diagnosis not present

## 2021-09-24 DIAGNOSIS — M542 Cervicalgia: Secondary | ICD-10-CM | POA: Diagnosis not present

## 2021-09-24 MED ORDER — EMPAGLIFLOZIN 10 MG PO TABS: 10.0000 mg | ORAL_TABLET | Freq: Every day | ORAL | 1 refills | Status: DC

## 2021-09-24 NOTE — Telephone Encounter (Signed)
Appt scheduled for this afternoon for xray

## 2021-09-24 NOTE — Patient Instructions (Addendum)
Referral to [facility] Let us know if you dont hear back within a week in regards to an appointment being scheduled.   Stop ALL insulin including glargine and lispro due to low blood sugar episodes.   We will start LOW dose jardiance and closely monitor for dizziness.   We discussed starting a new medication called Jardiance today which protects her kidneys, prevents against cardiovascular disease and lowers your hemoglobin A1c.  It is very important in this medication that you read the below.    You should stop taking Jardiance ( SGLT2 inhibitor) and seek medical attention immediately if you have any symptoms of ketoacidosis, a serious condition in which the body produces high levels of blood acids called ketones.    Symptoms of ketoacidosis include nausea, vomiting, abdominal pain, tiredness, and trouble breathing.    You should also be alert for signs and symptoms of a urinary tract infection, such as a feeling of burning when urinating or the need to urinate often or right away; pain in the lower part of the stomach area or pelvis; fever; or blood in the urine.   Lastly, if you develop vomiting or diarrhea, such as with gastroenteritis, you should also hold Jardiance and call the office immediately.  As any volume depletion on this medication can cause ketoacidosis.  Please hold Jardiance and contact me immediately if you are experiencing any of these symptoms above.  Empagliflozin Oral Tablets What is this medication? EMPAGLIFLOZIN (EM pa gli FLOE zin) helps to treat type 2 diabetes. It helps to control blood sugar. Treatment is combined with diet and exercise. This drug may also reduce the risk of heart attack, stroke, or death if you have type 2 diabetes and risk factors for heart disease. It also treats heart failure. Itmay lower the risk for treatment of heart failure in the hospital. This medicine may be used for other purposes; ask your health care provider orpharmacist if you  have questions. COMMON BRAND NAME(S): Jardiance What should I tell my care team before I take this medication? They need to know if you have any of these conditions: dehydration diabetic ketoacidosis diet low in salt eating less due to illness, surgery, dieting, or any other reason having surgery high cholesterol high levels of potassium in the blood history of pancreatitis or pancreas problems history of yeast infection of the penis or vagina if you often drink alcohol infections in the bladder, kidneys, or urinary tract kidney disease liver disease low blood pressure on hemodialysis problems urinating type 1 diabetes uncircumcised male an unusual or allergic reaction to empagliflozin, other medicines, foods, dyes, or preservatives pregnant or trying to get pregnant breast-feeding How should I use this medication? Take this medicine by mouth with water. Take it as directed on the prescription label at the same time every day. You may take it with or without food. Keeptaking it unless your health care provider tells you to stop. A special MedGuide will be given to you by the pharmacist with eachprescription and refill. Be sure to read this information carefully each time. Talk to your health care provider about the use of this medicine in children.Special care may be needed. Overdosage: If you think you have taken too much of this medicine contact apoison control center or emergency room at once. NOTE: This medicine is only for you. Do not share this medicine with others. What if I miss a dose? If you miss a dose, take it as soon as you can. If it is  almost time for yournext dose, take only that dose. Do not take double or extra doses. What may interact with this medication? alcohol diuretics insulin This list may not describe all possible interactions. Give your health care provider a list of all the medicines, herbs, non-prescription drugs, or dietary supplements you use. Also  tell them if you smoke, drink alcohol, or use illegaldrugs. Some items may interact with your medicine. What should I watch for while using this medication? Visit your health care provider for regular checks on your progress. Tell your health care provider if your symptoms do not start to get better or if they getworse. This medicine can cause a serious condition in which there is too much acid in the blood. If you develop nausea, vomiting, stomach pain, unusual tiredness, or breathing problems, stop taking this medicine and call your doctor right away.If possible, use a ketone dipstick to check for ketones in your urine. Check with your health care provider if you have severe diarrhea, nausea, and vomiting, or if you sweat a lot. The loss of too much body fluid may make itdangerous for you to take this medicine. A test called the HbA1C (A1C) will be monitored. This is a simple blood test. It measures your blood sugar control over the last 2 to 3 months. You willreceive this test every 3 to 6 months. Learn how to check your blood sugar. Learn the symptoms of low and high bloodsugar and how to manage them. Always carry a quick-source of sugar with you in case you have symptoms of low blood sugar. Examples include hard sugar candy or glucose tablets. Make sure others know that you can choke if you eat or drink when you develop serious symptoms of low blood sugar, such as seizures or unconsciousness. Get medicalhelp at once. Tell your health care provider if you have high blood sugar. You might need to change the dose of your medicine. If you are sick or exercising more thanusual, you may need to change the dose of your medicine. What side effects may I notice from receiving this medication? Side effects that you should report to your doctor or health care professionalas soon as possible: allergic reactions (skin rash, itching or hives, swelling of the face, lips, or tongue) breathing  problems dizziness feeling faint or lightheaded, falls genital infection (fever; tenderness, redness, or swelling in the genitals or area from the genitals to the back of the rectum) kidney injury (trouble passing urine or change in the amount of urine) low blood sugar (feeling anxious; confusion; dizziness; increased hunger; unusually weak or tired; increased sweating; shakiness; cold, clammy skin; irritable; headache; blurred vision; fast heartbeat; loss of consciousness) muscle weakness nausea, vomiting, unusual stomach upset or pain new pain or tenderness, change in skin color, sores or ulcers, or infection in legs or feet penile discharge, itching, or pain unusual tiredness unusual vaginal discharge, itching, or odor urinary tract infection (fever; chills; a burning feeling when urinating; urgent need to urinate more often; blood in the urine; back pain) Side effects that usually do not require medical attention (report to yourdoctor or health care professional if they continue or are bothersome): mild increase in urination thirsty This list may not describe all possible side effects. Call your doctor for medical advice about side effects. You may report side effects to FDA at1-800-FDA-1088. Where should I keep my medication? Keep out of the reach of children and pets. Store at room temperature between 20 and 25 degrees C (68 and 77  degrees F).Get rid of any unused medicine after the expiration date. To get rid of medicines that are no longer needed or have expired: Take the medicine to a medicine take-back program. Check with your pharmacy or law enforcement to find a location. If you cannot return the medicine, check the label or package insert to see if the medicine should be thrown out in the garbage or flushed down the toilet. If you are not sure, ask your health care provider. If it is safe to put it in the trash, take the medicine out of the container. Mix the medicine with cat  litter, dirt, coffee grounds, or other unwanted substance. Seal the mixture in a bag or container. Put it in the trash. NOTE: This sheet is a summary. It may not cover all possible information. If you have questions about this medicine, talk to your doctor, pharmacist, orhealth care provider.  2022 Elsevier/Gold Standard (2019-10-29 19:51:17)

## 2021-09-24 NOTE — Assessment & Plan Note (Addendum)
Uncontrolled. HEENT exam reassuring today.No evidence of post nasal drip, fluid from ears. Discussed patient's concern regarding CSF leak and that he did have symptoms of overlap including tinnitus, HA ( worse when upright), salty taste. No rhinorrhea. I have placed urgent referral to ENT to evaluate for CSF leak. Continue midodrine as prescribed by cardiology, Dr Charlestine Night. Pending XR cervical due to chronic neck pain, HA. Close follow up.

## 2021-09-24 NOTE — Assessment & Plan Note (Signed)
Blood sugar appears to be improving albeit labile as he has incurred two hypoglycemic episodes. Discussed inherent risk of hypoglycemia with insulin.  We opted to stop basaglar and lispro and to start low-dose Jardiance 10 mg for DM , cardiovascular risk reduction with close vigilance as relates any worsening of dizziness. Counseled on risk of dehydration and when to call the office while on jardiance.

## 2021-09-24 NOTE — Progress Notes (Signed)
Subjective:    Patient ID: Benjamin Conley, male    DOB: 19-Aug-1965, 56 y.o.   MRN: [id number]  CC: Benjamin Conley is a 56 y.o. male who presents today for follow up.   HPI: Chronic tinnitus, posterior HA. He is worried about CSF leak. He continues to have dizziness with slight improvement with midodrine.   He has salty or metallic taste in the mouth HA is worse when standing. Endorses chronic neck pain. No numbness in hands.   No recurrence of syncope.   No recent infections.   No cp, palpitations, nausea ,vomiting, sense of imbalance, rhinorrhea, drainage from ears.       No trauma  He has h/o spinal surgery 2012.   Consult with Dr Francesca Oman , neurology, regarding dizziness whom didn't recommend neurosurgerical intervention.  MRI 06/25/21 demonstrated Moderate to severe narrowing of the right proximal supraclinoid ICA and mild narrowing of the distal right cavernous ICA  Follow up with Dr Charlestine Night 08/30/21 for orthostatic hypotension. He is compliant with midodrine to 7.5 mg 3 times daily   DM- he feels sugars have improved. compliant with insulin glargine 9 units at bedtime, insulin lispro 4 units twice daily with meals, semaglutide 2 mg weekly. He is not drinking slushies. FBG 140.  Two episodes hypoglycemia at dinner time ; he reported he didn't eat that day. He didn't give himself the lispro on those days but did administer glargine.  Average glucose 149.     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   Tobacco abuse    Upper GI bleed 08/21/2019   Past Surgical History:  Procedure  Laterality Date   BACK SURGERY     lumbar   COLONOSCOPY WITH PROPOFOL N/A 08/24/2019   Procedure: COLONOSCOPY WITH PROPOFOL;  Surgeon: [name], MD;  Location: [facility] ENDOSCOPY;  Service: Endoscopy;  Laterality: N/A;   ESOPHAGOGASTRODUODENOSCOPY (EGD) WITH PROPOFOL N/A 08/24/2019   Procedure: ESOPHAGOGASTRODUODENOSCOPY (EGD) WITH PROPOFOL;  Surgeon: [name], MD;  Location: [facility] ENDOSCOPY;  Service: Endoscopy;  Laterality: N/A;   STENT PLACEMENT VASCULAR ([place] [facility])  07/2019   stenosis of distal splenic artery and stent placed   VISCERAL ANGIOGRAPHY N/A 08/06/2019   Procedure: VISCERAL ANGIOGRAPHY;  Surgeon: [name], MD;  Location: [place] [facility];  Service: Cardiovascular;  Laterality: N/A;   Family History  Problem Relation Age of Onset   Diabetes Mother    Diabetes Mellitus II Mother    Tuberculosis Father    Heart disease Father    Diabetes Sister    Diabetes Sister    Diabetes Sister    Diabetes Sister    Diabetes Brother    Diabetes Mellitus II Brother    Hyperlipidemia Brother    Cancer Maternal Aunt    Colon cancer Maternal Uncle    Prostate cancer Neg Hx     Allergies: Wasp venom, Wasp venom protein, Coffee flavor, and Onion Current Outpatient Medications on File Prior to Visit  Medication Sig Dispense Refill   albuterol (VENTOLIN HFA) 108 (90 Base) MCG/ACT inhaler Inhale 2 puffs into the lungs every 6 (six) hours as needed  for wheezing or shortness of breath. 6.7 g 3   aspirin 81 MG EC tablet Take 1 tablet (81 mg total) by mouth daily. 90 tablet 3   atorvastatin (LIPITOR) 40 MG tablet Take 1 tablet by mouth once daily 90 tablet 0   blood glucose meter kit and supplies Dispense based on patient and insurance preference. Use up to four times daily as directed. (FOR ICD-10 E10.9, E11.9). 1 each 0   Cholecalciferol (VITAMIN D) 125 MCG (5000 UT) CAPS Take 1 capsule by mouth daily.     clopidogrel (PLAVIX) 75 MG tablet Take 1 tablet (75 mg total) by mouth  daily. 90 tablet 3   Continuous Blood Gluc Sensor (FREESTYLE LIBRE 2 SENSOR) MISC 1 each by Does not apply route every 14 (fourteen) days. 2 each 4   digoxin (LANOXIN) 0.125 MG tablet Take 1 tablet (0.125 mg total) by mouth daily. 30 tablet 3   famotidine (PEPCID) 20 MG tablet Take 1 tablet (20 mg total) by mouth 2 (two) times daily.     fluticasone (FLONASE) 50 MCG/ACT nasal spray Place 2 sprays into both nostrils daily. 16 g 6   Insulin Pen Needle 32G X 4 MM MISC Use as directed to administer insulin 100 each 2   midodrine (PROAMATINE) 5 MG tablet Take 1.5 tablets (7.5 mg total) by mouth 3 (three) times daily with meals. 135 tablet 1   nystatin-triamcinolone ointment (MYCOLOG) Apply 1 application topically 2 (two) times daily. 60 g 0   Semaglutide, 2 MG/DOSE, 8 MG/3ML SOPN Inject 2 mg as directed once a week. 9 mL 1   sildenafil (VIAGRA) 100 MG tablet Take 1 tablet (100 mg total) by mouth daily as needed for erectile dysfunction. 30 tablet 0   triamcinolone cream (KENALOG) 0.1 % Apply 1 application topically 2 (two) times daily. 30 g 0   No current facility-administered medications on file prior to visit.    Social History   Tobacco Use   Smoking status: Former    Packs/day: 1.00    Years: 48.00    Total pack years: 48.00    Types: Cigarettes    Quit date: 02/12/2020    Years since quitting: 1.6   Smokeless tobacco: Never  Vaping Use   Vaping Use: Never used  Substance Use Topics   Alcohol use: No    Comment: Never been a problem   Drug use: No    Review of Systems  Constitutional:  Negative for chills and fever.  Respiratory:  Negative for cough.   Cardiovascular:  Negative for chest pain and palpitations.  Gastrointestinal:  Negative for nausea and vomiting.  Musculoskeletal:  Positive for neck pain.  Neurological:  Positive for dizziness and headaches.      Objective:    BP 121/88 (BP Location: Left Arm, Patient Position: Sitting, Cuff Size: Large)   Pulse (!) 104    Temp 97.6 F (36.4 C) (Oral)   Ht 5' 4"  (1.626 m)   Wt 180 lb 6.4 oz (81.8 kg)   SpO2 98%   BMI 30.97 kg/m  BP Readings from Last 3 Encounters:  09/24/21 121/88  08/30/21 120/80  08/13/21 136/78   Wt Readings from Last 3 Encounters:  09/24/21 180 lb 6.4 oz (81.8 kg)  08/30/21 181 lb (82.1 kg)  08/13/21 186 lb 3.2 oz (84.5 kg)    Physical Exam Vitals reviewed.  Constitutional:      Appearance: He is well-developed.  HENT:     Head: Normocephalic and atraumatic.  Right Ear: Hearing, tympanic membrane, ear canal and external ear normal. No decreased hearing noted. No drainage, swelling or tenderness. No middle ear effusion. Tympanic membrane is not injected, erythematous or bulging.     Left Ear: Hearing, tympanic membrane, ear canal and external ear normal. No decreased hearing noted. No drainage, swelling or tenderness.  No middle ear effusion. Tympanic membrane is not injected, erythematous or bulging.     Nose: Nose normal.     Right Sinus: No maxillary sinus tenderness or frontal sinus tenderness.     Left Sinus: No maxillary sinus tenderness or frontal sinus tenderness.     Mouth/Throat:     Pharynx: Uvula midline. No oropharyngeal exudate or posterior oropharyngeal erythema.     Tonsils: No tonsillar abscesses.  Eyes:     Conjunctiva/sclera: Conjunctivae normal.  Cardiovascular:     Rate and Rhythm: Regular rhythm.     Heart sounds: Normal heart sounds.  Pulmonary:     Effort: Pulmonary effort is normal. No respiratory distress.     Breath sounds: Normal breath sounds. No wheezing, rhonchi or rales.  Lymphadenopathy:     Head:     Right side of head: No submental, submandibular, tonsillar, preauricular, posterior auricular or occipital adenopathy.     Left side of head: No submental, submandibular, tonsillar, preauricular, posterior auricular or occipital adenopathy.     Cervical: No cervical adenopathy.  Skin:    General: Skin is warm and dry.  Neurological:      Mental Status: He is alert.  Psychiatric:        Speech: Speech normal.        Behavior: Behavior normal.        Assessment & Plan:   Problem List Items Addressed This Visit       Cardiovascular and Mediastinum   Orthostatic hypotension - Primary    Uncontrolled. HEENT exam reassuring today.No evidence of post nasal drip, fluid from ears. Discussed patient's concern regarding CSF leak and that he did have symptoms of overlap including tinnitus, HA ( worse when upright), salty taste. No rhinorrhea. I have placed urgent referral to ENT to evaluate for CSF leak. Continue midodrine as prescribed by cardiology, Dr Charlestine Night. Pending XR cervical due to chronic neck pain, HA. Close follow up.       Relevant Orders   Ambulatory referral to ENT   DG Cervical Spine Complete     Endocrine   Diabetes mellitus without complication ([place])    Blood sugar appears to be improving albeit labile as he has incurred two hypoglycemic episodes. Discussed inherent risk of hypoglycemia with insulin.  We opted to stop basaglar and lispro and to start low-dose Jardiance 10 mg for DM , cardiovascular risk reduction with close vigilance as relates any worsening of dizziness. Counseled on risk of dehydration and when to call the office while on jardiance.       Relevant Medications   empagliflozin (JARDIANCE) 10 MG TABS tablet     I have discontinued Devean R. Mehta's insulin lispro and insulin glargine. I am also having him start on empagliflozin. Additionally, I am having him maintain his sildenafil, blood glucose meter kit and supplies, nystatin-triamcinolone ointment, clopidogrel, aspirin EC, albuterol, Semaglutide (2 MG/DOSE), Insulin Pen Needle, triamcinolone cream, Vitamin D, digoxin, fluticasone, midodrine, FreeStyle Libre 2 Sensor, famotidine, and atorvastatin.   Meds ordered this encounter  Medications   empagliflozin (JARDIANCE) 10 MG TABS tablet    Sig: Take 1 tablet (10 mg total) by mouth daily  before breakfast.    Dispense:  30 tablet    Refill:  1    Order Specific Question:   Supervising Provider    Answer:   Crecencio Mc [[id number]]    Return precautions given.   Risks, benefits, and alternatives of the medications and treatment plan prescribed today were discussed, and patient expressed understanding.   Education regarding symptom management and diagnosis given to patient on AVS.  Continue to follow with Burnard Hawthorne, [name] for routine health maintenance.   Detron Rennie Natter and I agreed with plan.   [name], [name]

## 2021-09-25 ENCOUNTER — Encounter: Payer: Self-pay | Admitting: Family

## 2021-10-03 ENCOUNTER — Telehealth: Payer: Medicare Other

## 2021-10-10 ENCOUNTER — Encounter: Payer: Self-pay | Admitting: Pulmonary Disease

## 2021-10-10 ENCOUNTER — Ambulatory Visit (INDEPENDENT_AMBULATORY_CARE_PROVIDER_SITE_OTHER): Payer: Medicare Other | Admitting: Pulmonary Disease

## 2021-10-10 VITALS — BP 122/60 | HR 100 | Temp 97.8°F | Ht 64.0 in | Wt 181.2 lb

## 2021-10-10 DIAGNOSIS — Z87891 Personal history of nicotine dependence: Secondary | ICD-10-CM | POA: Diagnosis not present

## 2021-10-10 DIAGNOSIS — J449 Chronic obstructive pulmonary disease, unspecified: Secondary | ICD-10-CM | POA: Diagnosis not present

## 2021-10-10 NOTE — Patient Instructions (Signed)
It seems you are doing well.  Your emphysema is mild.  Continue using your albuterol as needed.  You did the best thing for your lungs by quitting smoking.  We will see you back in a year or as needed.

## 2021-10-10 NOTE — Progress Notes (Signed)
Subjective:    Patient ID: Benjamin Conley, male    DOB: [birth year], 56 y.o.   MRN: [id number] Patient Care Team: Burnard Hawthorne, [name] as PCP - General (Family Medicine) [name], MD as PCP - Cardiology (Cardiology) Lloyd Huger, MD as Consulting Physician (Oncology)  Chief Complaint  Patient presents with   Follow-up    PFT-no current sx.    HPI The patient is a 56 year old former smoker (48-pack-year history) who presents for follow-up on the issue of pulmonary emphysema and shortness of breath.  He was last seen here 14 June 2021 at that time he was doing well.  We requested pulmonary function testing to establish a baseline with regards to his pulmonary emphysema.  Previously, he was given a trial of Stiolto 2 puffs daily however he did not find that this was helpful in any way.  He has had chronic dyspnea of longstanding with heavy exertion and bending over. However this has improved significantly since he quit smoking.  He states that this does not limit his activity which is already limited due to his need to use a cane for ambulation.  He requires a cane with ambulation due to dizziness and unsteadiness.  He had lung cancer screening program CT on 02 May 2021 this was a lung RADS 2, he will have follow-up CT next February.  He does have emphysema on CT.  He uses albuterol only rarely and reports that last he used was approximately 8 months ago.  He does not endorse any other symptomatology today.  He has not had any cough or hemoptysis.  He has not had any orthopnea, paroxysmal nocturnal dyspnea, fevers, chills or sweats.  No cough or sputum production.  Overall he feels that he is doing well.  He had pulmonary function testing performed on 13 September 2021 showed a FEV1 of 2.71 L or 89% predicted, FVC of 3.62 L or 91% predicted, FEV1/FVC of 75%, no significant bronchodilator response.  Lung volumes were normal.  Diffusion capacity is mildly impaired.  Testing is  consistent with diagnosis of emphysema, very mild in nature  Review of Systems A 10 point review of systems was performed and it is as noted above otherwise negative.  Patient Active Problem List   Diagnosis Date Noted   Bilateral hand pain 08/13/2021   Dizziness 06/11/2021   Sinus congestion 06/11/2021   Pulmonary emphysema ([place]) 05/07/2021   GERD (gastroesophageal reflux disease) 01/12/2021   Screening for blood or protein in urine 06/16/2020   Contact dermatitis 06/16/2020   Leukocytosis 01/30/2020   Nicotine use disorder 01/27/2020   Slow transit constipation 01/27/2020   Nail avulsion of toe, initial encounter 12/09/2019   Benign prostatic hyperplasia with hesitancy 10/30/2019   Abnormal ejaculation 10/21/2019   B12 deficiency 10/21/2019   Rectal pressure 10/21/2019   Thrombosis of splenic artery (HCC) 10/03/2019   Abnormal brain MRI 09/22/2019   Difficulty sleeping 09/22/2019   Tinnitus of both ears 09/22/2019   Pain due to onychomycosis of toenails of both feet 09/06/2019   Coagulation disorder ([place]) 09/06/2019   Iron deficiency anemia due to chronic blood loss 08/29/2019   Thrombocytosis 08/24/2019   Angiodysplasia of intestinal tract    Black stool 08/22/2019   Presence of arterial stent - splenic artery 08/21/2019   Orthostatic hypotension 08/19/2019   Tachycardia 08/19/2019   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   COPD with chronic bronchitis ([place]) 08/01/2019   Tobacco abuse 08/01/2019   Diabetes mellitus without complication ([place])  Hyperlipidemia    Impingement syndrome of shoulder region 03/30/2018   Hypertension 11/10/2013   Social History   Tobacco Use   Smoking status: Former    Packs/day: 2.00    Years: 48.00    Total pack years: 96.00    Types: Cigarettes    Quit date: 02/12/2020    Years since quitting: 1.6   Smokeless tobacco: Never  Substance Use Topics   Alcohol use: No    Comment: Never been a problem   Allergies  Allergen Reactions   Wasp  Venom Anaphylaxis   Wasp Venom Protein Anaphylaxis   Coffee Flavor Nausea And Vomiting    Patient states coffee gives him nausea and stomach cramps   Onion Nausea And Vomiting   Social History   Tobacco Use   Smoking status: Former    Packs/day: 2.00    Years: 48.00    Total pack years: 96.00    Types: Cigarettes    Quit date: 02/12/2020    Years since quitting: 1.6   Smokeless tobacco: Never  Substance Use Topics   Alcohol use: No    Comment: Never been a problem   Allergies  Allergen Reactions   Wasp Venom Anaphylaxis   Wasp Venom Protein Anaphylaxis   Coffee Flavor Nausea And Vomiting    Patient states coffee gives him nausea and stomach cramps   Onion Nausea And Vomiting   Current Meds  Medication Sig   albuterol (VENTOLIN HFA) 108 (90 Base) MCG/ACT inhaler Inhale 2 puffs into the lungs every 6 (six) hours as needed for wheezing or shortness of breath.   aspirin 81 MG EC tablet Take 1 tablet (81 mg total) by mouth daily.   atorvastatin (LIPITOR) 40 MG tablet Take 1 tablet by mouth once daily   blood glucose meter kit and supplies Dispense based on patient and insurance preference. Use up to four times daily as directed. (FOR ICD-10 E10.9, E11.9).   Cholecalciferol (VITAMIN D) 125 MCG (5000 UT) CAPS Take 1 capsule by mouth daily.   clopidogrel (PLAVIX) 75 MG tablet Take 1 tablet (75 mg total) by mouth daily.   Continuous Blood Gluc Sensor (FREESTYLE LIBRE 2 SENSOR) MISC 1 each by Does not apply route every 14 (fourteen) days.   digoxin (LANOXIN) 0.125 MG tablet Take 1 tablet (0.125 mg total) by mouth daily.   empagliflozin (JARDIANCE) 10 MG TABS tablet Take 1 tablet (10 mg total) by mouth daily before breakfast.   famotidine (PEPCID) 20 MG tablet Take 1 tablet (20 mg total) by mouth 2 (two) times daily.   fluticasone (FLONASE) 50 MCG/ACT nasal spray Place 2 sprays into both nostrils daily.   Insulin Pen Needle 32G X 4 MM MISC Use as directed to administer insulin    midodrine (PROAMATINE) 5 MG tablet Take 1.5 tablets (7.5 mg total) by mouth 3 (three) times daily with meals.   nystatin-triamcinolone ointment (MYCOLOG) Apply 1 application topically 2 (two) times daily.   Semaglutide, 2 MG/DOSE, 8 MG/3ML SOPN Inject 2 mg as directed once a week.   sildenafil (VIAGRA) 100 MG tablet Take 1 tablet (100 mg total) by mouth daily as needed for erectile dysfunction.   triamcinolone cream (KENALOG) 0.1 % Apply 1 application topically 2 (two) times daily.   Immunization History  Administered Date(s) Administered   Td 03/11/2004   Tdap 08/02/2014      Objective:   Physical Exam BP 122/60 (BP Location: Left Arm, Cuff Size: Normal)   Pulse 100   Temp 97.8  F (36.6 C) (Temporal)   Ht 5' 4"  (1.626 m)   Wt 181 lb 3.2 oz (82.2 kg)   SpO2 95%   BMI 31.10 kg/m  GENERAL: Disheveled appearing gentleman, no acute distress, ambulatory with assistance of a cane.  He looks much older than stated age.  No conversational dyspnea. HEAD: Normocephalic, atraumatic.  EYES: Pupils equal, round, reactive to light.  No scleral icterus.  MOUTH: Nose/mouth/throat not examined due to masking requirements for COVID 19. NECK: Supple. No thyromegaly. Trachea midline. No JVD.  No adenopathy. PULMONARY: Good air entry bilaterally.  Coarse, otherwise, no adventitious sounds. CARDIOVASCULAR: S1 and S2. Regular rate and rhythm.  No rubs, murmurs or gallops heard. ABDOMEN: Benign. MUSCULOSKELETAL: No joint deformity, no clubbing, no edema.  NEUROLOGIC: No overt focal deficit, gait is unsteady and requires cane for ambulation.  Speech is fluent though he does have some occasional word finding difficulty, states this is chronic. SKIN: Intact,warm,dry. PSYCH: Mood and behavior normal.     Assessment & Plan:     ICD-10-CM   1. Centrilobular emphysema ([place])  J43.2    Patient relatively asymptomatic in this regard Mild impairment Continue as needed albuterol    2. Former heavy cigarette  smoker (20-39 per day)  Z87.891    No evidence of relapse Commended on smoking cessation     Patient is doing well.  Has not felt that maintenance inhalers have helped him in the past.  Continue using as needed albuterol.  We will see him back in a year's time or as needed.  Continue lung cancer screening program.  Renold Don, MD Advanced Bronchoscopy PCCM [facility]    *This note was dictated using voice recognition software/Dragon.  Despite best efforts to proofread, errors can occur which can change the meaning. Any transcriptional errors that result from this process are unintentional and may not be fully corrected at the time of dictation.

## 2021-10-12 DIAGNOSIS — R42 Dizziness and giddiness: Secondary | ICD-10-CM | POA: Diagnosis not present

## 2021-10-12 DIAGNOSIS — H9313 Tinnitus, bilateral: Secondary | ICD-10-CM | POA: Diagnosis not present

## 2021-10-12 DIAGNOSIS — H903 Sensorineural hearing loss, bilateral: Secondary | ICD-10-CM | POA: Diagnosis not present

## 2021-10-18 NOTE — Telephone Encounter (Signed)
Called patient back  and set him an appt for tomorrow at 12

## 2021-10-18 NOTE — Telephone Encounter (Signed)
Spoke to patient and he stated that since he was in office and began taking the Jardiance his sugar has been going back up in stead of decreasing. Numbers have been in the ranges of 204-288 over last 14 days? Would you like me to schedule an OV or just have him stop Jardiance?

## 2021-10-19 ENCOUNTER — Ambulatory Visit (INDEPENDENT_AMBULATORY_CARE_PROVIDER_SITE_OTHER): Payer: Medicare Other | Admitting: Family

## 2021-10-19 ENCOUNTER — Other Ambulatory Visit: Payer: Self-pay

## 2021-10-19 ENCOUNTER — Encounter: Payer: Self-pay | Admitting: Family

## 2021-10-19 DIAGNOSIS — E119 Type 2 diabetes mellitus without complications: Secondary | ICD-10-CM | POA: Diagnosis not present

## 2021-10-19 DIAGNOSIS — R42 Dizziness and giddiness: Secondary | ICD-10-CM | POA: Diagnosis not present

## 2021-10-19 MED ORDER — SEMAGLUTIDE(0.25 OR 0.5MG/DOS) 2 MG/3ML ~~LOC~~ SOPN: 0.2500 mg | PEN_INJECTOR | SUBCUTANEOUS | 2 refills | Status: DC

## 2021-10-19 MED ORDER — MIDODRINE HCL 5 MG PO TABS: 7.5000 mg | ORAL_TABLET | Freq: Three times a day (TID) | ORAL | 1 refills | Status: DC

## 2021-10-19 NOTE — Telephone Encounter (Signed)
I spoke with patient & turned out he was able to get from [place] after all. No need to send elsewhere.

## 2021-10-19 NOTE — Progress Notes (Signed)
 Subjective:    Patient ID: Benjamin Conley, male    DOB: 08/09/1965, 55 y.o.   MRN: [id number]  CC: Benjamin Conley is a 55 y.o. male who presents today for an acute visit.    HPI: Hyperglycemia   2-3 weeks ago stop Basaglar and lispro.  Patient started Jardiance 10 mg.  He also stopped mistakenly stopped Ozempic 2mg at that time FBG 132, 200, 131, 226 Average 178 over 14 days No wounds, dysuria.   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   Tobacco abuse    Upper GI bleed 08/21/2019   Past Surgical History:  Procedure Laterality Date   BACK SURGERY     lumbar   COLONOSCOPY WITH PROPOFOL N/A 08/24/2019   Procedure: COLONOSCOPY WITH PROPOFOL;  Surgeon: Wohl, Darren, MD;  Location: [facility] ENDOSCOPY;  Service: Endoscopy;  Laterality: N/A;   ESOPHAGOGASTRODUODENOSCOPY (EGD) WITH PROPOFOL N/A 08/24/2019   Procedure: ESOPHAGOGASTRODUODENOSCOPY (EGD) WITH PROPOFOL;  Surgeon: Wohl, Darren, MD;  Location: [facility] ENDOSCOPY;  Service: Endoscopy;  Laterality: N/A;   STENT PLACEMENT VASCULAR ([facility] HX)  07/2019   stenosis of distal splenic artery and stent placed   VISCERAL ANGIOGRAPHY N/A 08/06/2019   Procedure: VISCERAL ANGIOGRAPHY;  Surgeon: Schnier, Gregory G, MD;  Location: [facility] INVASIVE CV LAB;  Service: Cardiovascular;  Laterality: N/A;   Family History  Problem Relation Age of Onset   Diabetes Mother    Diabetes Mellitus II Mother    Tuberculosis Father    Heart disease Father    Diabetes Sister    Diabetes Sister    Diabetes Sister    Diabetes Sister     Diabetes Brother    Diabetes Mellitus II Brother    Hyperlipidemia Brother    Cancer Maternal Aunt    Colon cancer Maternal Uncle    Prostate cancer Neg Hx     Allergies: Wasp venom, Wasp venom protein, Coffee flavor, and Onion Current Outpatient Medications on File Prior to Visit  Medication Sig Dispense Refill   albuterol (VENTOLIN HFA) 108 (90 Base) MCG/ACT inhaler Inhale 2 puffs into the lungs every 6 (six) hours as needed for wheezing or shortness of breath. 6.7 g 3   aspirin 81 MG EC tablet Take 1 tablet (81 mg total) by mouth daily. 90 tablet 3   atorvastatin (LIPITOR) 40 MG tablet Take 1 tablet by mouth once daily 90 tablet 0   blood glucose meter kit and supplies Dispense based on patient and insurance preference. Use up to four times daily as directed. (FOR ICD-10 E10.9, E11.9). 1 each 0   Cholecalciferol (VITAMIN D) 125 MCG (5000 UT) CAPS Take 1 capsule by mouth daily.     clopidogrel (PLAVIX) 75 MG tablet Take 1 tablet (75 mg total) by mouth daily. 90 tablet 3   Continuous Blood Gluc Sensor (FREESTYLE LIBRE 2 SENSOR) MISC 1 each by Does not apply route every 14 (fourteen) days. 2 each 4   digoxin (LANOXIN) 0.125 MG tablet Take 1 tablet (0.125 mg total) by mouth daily. 30 tablet 3   empagliflozin (JARDIANCE) 10 MG TABS tablet   Take 1 tablet (10 mg total) by mouth daily before breakfast. 30 tablet 1   famotidine (PEPCID) 20 MG tablet Take 1 tablet (20 mg total) by mouth 2 (two) times daily.     fluticasone (FLONASE) 50 MCG/ACT nasal spray Place 2 sprays into both nostrils daily. 16 g 6   Insulin Pen Needle 32G X 4 MM MISC Use as directed to administer insulin 100 each 2   nystatin-triamcinolone ointment (MYCOLOG) Apply 1 application topically 2 (two) times daily. 60 g 0   sildenafil (VIAGRA) 100 MG tablet Take 1 tablet (100 mg total) by mouth daily as needed for erectile dysfunction. 30 tablet 0   triamcinolone cream (KENALOG) 0.1 % Apply 1 application topically 2 (two) times daily.  30 g 0   No current facility-administered medications on file prior to visit.    Social History   Tobacco Use   Smoking status: Former    Packs/day: 2.00    Years: 48.00    Total pack years: 96.00    Types: Cigarettes    Quit date: 02/12/2020    Years since quitting: 1.6   Smokeless tobacco: Never  Vaping Use   Vaping Use: Never used  Substance Use Topics   Alcohol use: No    Comment: Never been a problem   Drug use: No    Review of Systems  Constitutional:  Negative for chills and fever.  Respiratory:  Negative for cough.   Cardiovascular:  Negative for chest pain and palpitations.  Gastrointestinal:  Negative for nausea and vomiting.  Genitourinary:  Negative for dysuria.  Skin:  Negative for wound.      Objective:    BP 138/82 (BP Location: Right Arm, Patient Position: Sitting, Cuff Size: Normal)   Pulse (!) 109   Temp 98 F (36.7 C) (Oral)   Ht 5' 4" (1.626 m)   Wt 179 lb (81.2 kg)   SpO2 98%   BMI 30.73 kg/m    Physical Exam Vitals reviewed.  Constitutional:      Appearance: He is well-developed.  Cardiovascular:     Rate and Rhythm: Regular rhythm.     Heart sounds: Normal heart sounds.  Pulmonary:     Effort: Pulmonary effort is normal. No respiratory distress.     Breath sounds: Normal breath sounds. No wheezing, rhonchi or rales.  Skin:    General: Skin is warm and dry.  Neurological:     Mental Status: He is alert.  Psychiatric:        Speech: Speech normal.        Behavior: Behavior normal.        Assessment & Plan:   Problem List Items Addressed This Visit       Endocrine   Diabetes mellitus without complication ([place])    Patient discontinued insulin therapy as well as Ozempic.  Counseled him that Ozempic is not insulin and I had intended for him to stay on Ozempic 2 mg.  He has been off of Ozempic for 2 to 3 weeks; we have opted to resume course at 0.25 mg.  He will continue Jardiance 10 mg.  We may consider increasing jardiance  however with a longstanding history of dizziness, I would be very cautious in doing so.  He will let me know how he is doing.      Relevant Medications   Semaglutide,0.25 or 0.5MG/DOS, 2 MG/3ML SOPN     Other   Dizziness   Relevant Medications   midodrine (PROAMATINE) 5 MG  tablet      I have discontinued Benjamin Conley's Semaglutide (2 MG/DOSE). I am also having him start on Semaglutide(0.25 or 0.5MG/DOS). Additionally, I am having him maintain his sildenafil, blood glucose meter kit and supplies, nystatin-triamcinolone ointment, clopidogrel, aspirin EC, albuterol, Insulin Pen Needle, triamcinolone cream, Vitamin D, digoxin, fluticasone, FreeStyle Libre 2 Sensor, famotidine, atorvastatin, empagliflozin, and midodrine.   Meds ordered this encounter  Medications   midodrine (PROAMATINE) 5 MG tablet    Sig: Take 1.5 tablets (7.5 mg total) by mouth 3 (three) times daily with meals.    Dispense:  180 tablet    Refill:  1    Order Specific Question:   Supervising Provider    Answer:   TULLO, TERESA L [[id number]]   Semaglutide,0.25 or 0.5MG/DOS, 2 MG/3ML SOPN    Sig: Inject 0.25 mg into the skin once a week. After 4 weeks, increase to 0.5mg [place] qwk    Dispense:  3 mL    Refill:  2    Order Specific Question:   Supervising Provider    Answer:   TULLO, TERESA L [[id number]]    Return precautions given.   Risks, benefits, and alternatives of the medications and treatment plan prescribed today were discussed, and patient expressed understanding.   Education regarding symptom management and diagnosis given to patient on AVS.  Continue to follow with Arnett, Margaret G, [name] for routine health maintenance.   Benjamin Conley and I agreed with plan.   Margaret Arnett, [name]  

## 2021-10-19 NOTE — Patient Instructions (Addendum)
  Lets plan to resume Ozempic ( NON- insulin)  however we will start at beginning dose of 0.25 mg.  You may increase in 4 weeks time per prescription.  Please continue Jardiance 10 mg.  You are may remain off of Basaglar and lispro which are both insulin.  Goal of fasting blood sugars is between 80-120.   Please let me know of any concerns and have a great weekend!

## 2021-10-19 NOTE — Assessment & Plan Note (Signed)
Patient discontinued insulin therapy as well as Ozempic.  Counseled him that Ozempic is not insulin and I had intended for him to stay on Ozempic 2 mg.  He has been off of Ozempic for 2 to 3 weeks; we have opted to resume course at 0.25 mg.  He will continue Jardiance 10 mg.  We may consider increasing jardiance however with a longstanding history of dizziness, I would be very cautious in doing so.  He will let me know how he is doing.

## 2021-10-23 ENCOUNTER — Ambulatory Visit (INDEPENDENT_AMBULATORY_CARE_PROVIDER_SITE_OTHER): Payer: Medicare Other

## 2021-10-23 VITALS — Ht 64.0 in | Wt 179.0 lb

## 2021-10-23 DIAGNOSIS — Z0000 Encounter for general adult medical examination without abnormal findings: Secondary | ICD-10-CM | POA: Diagnosis not present

## 2021-10-23 NOTE — Patient Instructions (Addendum)
  [name] , Thank you for taking time to come for your Medicare Wellness Visit. I appreciate your ongoing commitment to your health goals. Please review the following plan we discussed and let me know if I can assist you in the future.   These are the goals we discussed:  Goals       Patient Stated     I need to drink more water (pt-stated)      Stay hydrated        This is a list of the screening recommended for you and due dates:  Health Maintenance  Topic Date Due   Urine Protein Check  11/21/2021   Hepatitis C Screening: USPSTF Recommendation to screen - Ages 18-79 yo.  12/09/2021*   Complete foot exam   11/21/2021   Hemoglobin A1C  02/12/2022   Eye exam for diabetics  07/10/2022   Tetanus Vaccine  08/01/2024   Colon Cancer Screening  08/23/2029   HIV Screening  Completed   HPV Vaccine  Aged Out   Flu Shot  Discontinued   COVID-19 Vaccine  Discontinued   Zoster (Shingles) Vaccine  Discontinued  *Topic was postponed. The date shown is not the original due date.

## 2021-10-23 NOTE — Progress Notes (Signed)
Subjective:   Benjamin Conley is a 56 y.o. male who presents for Medicare Annual/Subsequent preventive examination.  Review of Systems    No ROS.  Medicare Wellness Virtual Visit.  Visual/audio telehealth visit, UTA vital signs.   See social history for additional risk factors.   Cardiac Risk Factors include: advanced age (>52mn, >>55women);male gender     Objective:    Today's Vitals   10/23/21 1237  Weight: 179 lb (81.2 kg)  Height: 5' 4"  (1.626 m)   Body mass index is 30.73 kg/m.     10/23/2021   12:42 PM 07/18/2021   12:50 PM 05/07/2021    1:04 PM 01/30/2021    1:35 PM 11/03/2020    1:03 PM 10/19/2020    8:42 AM 06/30/2020    1:05 PM  Advanced Directives  Does Patient Have a Medical Advance Directive? Yes Yes No Yes No Yes No  Type of [place] Living will  Living will Out of facility DNR (pink MOST or yellow form) [place]   Does patient want to make changes to medical advance directive? No - Patient declined No - Patient declined  No - Patient declined No - Patient declined No - Patient declined   Copy of [place]ounty [place] Chart? No - copy requested     No - copy requested   Would patient like information on creating a medical advance directive?   No - Patient declined  No - Patient declined  No - Patient declined    Current Medications (verified) Outpatient Encounter Medications as of 10/23/2021  Medication Sig   albuterol (VENTOLIN HFA) 108 (90 Base) MCG/ACT inhaler Inhale 2 puffs into the lungs every 6 (six) hours as needed for wheezing or shortness of breath.   aspirin 81 MG EC tablet Take 1 tablet (81 mg total) by mouth daily.   atorvastatin (LIPITOR) 40 MG tablet Take 1 tablet by mouth once daily   blood glucose meter kit and supplies Dispense based on patient and insurance preference. Use up to four times daily as directed. (FOR ICD-10 E10.9, E11.9).    Cholecalciferol (VITAMIN D) 125 MCG (5000 UT) CAPS Take 1 capsule by mouth daily.   clopidogrel (PLAVIX) 75 MG tablet Take 1 tablet (75 mg total) by mouth daily.   Continuous Blood Gluc Sensor (FREESTYLE LIBRE 2 SENSOR) MISC 1 each by Does not apply route every 14 (fourteen) days.   digoxin (LANOXIN) 0.125 MG tablet Take 1 tablet (0.125 mg total) by mouth daily.   empagliflozin (JARDIANCE) 10 MG TABS tablet Take 1 tablet (10 mg total) by mouth daily before breakfast.   famotidine (PEPCID) 20 MG tablet Take 1 tablet (20 mg total) by mouth 2 (two) times daily.   fluticasone (FLONASE) 50 MCG/ACT nasal spray Place 2 sprays into both nostrils daily.   Insulin Pen Needle 32G X 4 MM MISC Use as directed to administer insulin   midodrine (PROAMATINE) 5 MG tablet Take 1.5 tablets (7.5 mg total) by mouth 3 (three) times daily with meals.   nystatin-triamcinolone ointment (MYCOLOG) Apply 1 application topically 2 (two) times daily.   Semaglutide,0.25 or 0.5MG/DOS, 2 MG/3ML SOPN Inject 0.25 mg into the skin once a week. After 4 weeks, increase to 0.554mSC qwk   sildenafil (VIAGRA) 100 MG tablet Take 1 tablet (100 mg total) by mouth daily as needed for erectile dysfunction.   triamcinolone cream (KENALOG) 0.1 % Apply 1 application topically  2 (two) times daily.   No facility-administered encounter medications on file as of 10/23/2021.    Allergies (verified) Wasp venom, Wasp venom protein, Coffee flavor, and Onion   History: Past Medical History:  Diagnosis Date   Acute posthemorrhagic anemia    Complication of anesthesia    c/o difficulty breathing after anesthesia   COPD (chronic obstructive pulmonary disease) (HCC)    Diabetes mellitus without complication ([place])    Hyperlipemia    Melena 08/21/2019   Paroxysmal supraventricular tachycardia (HCC) 08/19/2019   Postural dizziness with presyncope 08/21/2019   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   Tobacco abuse    Upper GI bleed 08/21/2019   Past Surgical  History:  Procedure Laterality Date   BACK SURGERY     lumbar   COLONOSCOPY WITH PROPOFOL N/A 08/24/2019   Procedure: COLONOSCOPY WITH PROPOFOL;  Surgeon: [name], MD;  Location: [facility] ENDOSCOPY;  Service: Endoscopy;  Laterality: N/A;   ESOPHAGOGASTRODUODENOSCOPY (EGD) WITH PROPOFOL N/A 08/24/2019   Procedure: ESOPHAGOGASTRODUODENOSCOPY (EGD) WITH PROPOFOL;  Surgeon: [name], MD;  Location: [facility] ENDOSCOPY;  Service: Endoscopy;  Laterality: N/A;   STENT PLACEMENT VASCULAR ([place] HX)  07/2019   stenosis of distal splenic artery and stent placed   VISCERAL ANGIOGRAPHY N/A 08/06/2019   Procedure: VISCERAL ANGIOGRAPHY;  Surgeon: [name], MD;  Location: [place] [facility] LAB;  Service: Cardiovascular;  Laterality: N/A;   Family History  Problem Relation Age of Onset   Diabetes Mother    Diabetes Mellitus II Mother    Tuberculosis Father    Heart disease Father    Diabetes Sister    Diabetes Sister    Diabetes Sister    Diabetes Sister    Diabetes Brother    Diabetes Mellitus II Brother    Hyperlipidemia Brother    Cancer Maternal Aunt    Colon cancer Maternal Uncle    Prostate cancer Neg Hx    Social History   Socioeconomic History   Marital status: Single    Spouse name: Not on file   Number of children: Not on file   Years of education: Not on file   Highest education level: Not on file  Occupational History   Not on file  Tobacco Use   Smoking status: Former    Packs/day: 2.00    Years: 48.00    Total pack years: 96.00    Types: Cigarettes    Quit date: 02/12/2020    Years since quitting: 1.6   Smokeless tobacco: Never  Vaping Use   Vaping Use: Never used  Substance and Sexual Activity   Alcohol use: No    Comment: Never been a problem   Drug use: No   Sexual activity: Yes  Other Topics Concern   Not on file  Social History Narrative   Single, lives alone, unemployed.   Social Determinants of Health   Financial Resource Strain: Low Risk   (10/23/2021)   Overall Financial Resource Strain (CARDIA)    Difficulty of Paying Living Expenses: Not hard at all  Food Insecurity: No Food Insecurity (10/23/2021)   Hunger Vital Sign    Worried About Running Out of Food in the Last Year: Never true    Ran Out of Food in the Last Year: Never true  Transportation Needs: No Transportation Needs (10/23/2021)   PRAPARE - Hydrologist (Medical): No    Lack of Transportation (Non-Medical): No  Physical Activity: Inactive (07/18/2021)   Exercise Vital Sign  Days of Exercise per Week: 0 days    Minutes of Exercise per Session: 0 min  Stress: No Stress Concern Present (10/23/2021)   [place]    Feeling of Stress : Not at all  Social Connections: Unknown (10/19/2020)   Social Connection and Isolation Panel [NHANES]    Frequency of Communication with Friends and Family: Not on file    Frequency of Social Gatherings with Friends and Family: More than three times a week    Attends Religious Services: Not on Advertising copywriter or Organizations: No    Attends Music therapist: Not on file    Marital Status: Not on file    Tobacco Counseling Counseling given: Not Answered   Clinical Intake:  Pre-visit preparation completed: Yes        Diabetes: Yes (Followed by PCP)  How often do you need to have someone help you when you read instructions, pamphlets, or other written materials from your doctor or pharmacy?: 1 - Never  Interpreter Needed?: No    Activities of Daily Living    10/23/2021   12:43 PM  In your present state of health, do you have any difficulty performing the following activities:  Hearing? 1  Comment Hearing aid  Vision? 0  Difficulty concentrating or making decisions? 0  Walking or climbing stairs? 0  Comment Paces self with activity  Dressing or bathing? 0  Doing errands, shopping? 0   Preparing Food and eating ? N  Using the Toilet? N  In the past six months, have you accidently leaked urine? N  Comment Followed by Urology and PCP  Do you have problems with loss of bowel control? N  Managing your Medications? N  Managing your Finances? N  Housekeeping or managing your Housekeeping? N    Patient Care Team: Burnard Hawthorne, [name] as PCP - General (Family Medicine) [name], MD as PCP - Cardiology (Cardiology) Lloyd Huger, MD as Consulting Physician (Oncology)  Indicate any recent Medical Services you may have received from other than [facility] providers in the past year (date may be approximate).     Assessment:   This is a routine wellness examination for Juergen.  Virtual Visit via Telephone Note  I connected with  [name] on 10/23/21 at 12:30 PM EDT by telephone and verified that I am speaking with the correct person using two identifiers.  Location: Patient: home Provider: office Persons participating in the virtual visit: patient/Nurse Health Advisor   I discussed the limitations of performing an evaluation and management service by telehealth. We continued and completed visit with audio only. Some vital signs may be absent or patient reported.   Hearing/Vision screen Hearing Screening - Comments:: Hearing aid Vision Screening - Comments:: Followed by [facility]  Wears corrective lenses  No retinopathy reported   Dietary issues and exercise activities discussed: Current Exercise Habits: Home exercise routine, Intensity: Mild   Goals Addressed               This Visit's Progress     Patient Stated     I need to drink more water (pt-stated)   On track     Stay hydrated       Depression Screen    10/23/2021   12:40 PM 08/13/2021    9:31 AM 07/18/2021    1:11 PM 07/06/2021   10:32 AM 05/08/2021    1:16 PM 03/08/2021  1:38 PM 02/21/2021   11:36 AM  PHQ 2/9 Scores  PHQ - 2 Score 0 0 0 0 0 0 0  PHQ- 9 Score     0       Fall Risk    10/23/2021   12:43 PM 08/13/2021    9:31 AM 07/18/2021    1:10 PM 07/06/2021   10:32 AM 05/08/2021    1:16 PM  Fall Risk   Falls in the past year? 0 0 1 0 1  Number falls in past yr: 0 0 1 0 0  Comment   LAST FALL ABOUT 7 MONTHS AGO    Injury with Fall?  0 0 0 0  Risk for fall due to :  No Fall Risks History of fall(s);Medication side effect;Impaired mobility;Impaired balance/gait;Impaired vision No Fall Risks History of fall(s)  Follow up Falls evaluation completed Falls evaluation completed Falls prevention discussed;Education provided;Falls evaluation completed Falls evaluation completed Falls evaluation completed    FALL RISK PREVENTION PERTAINING TO THE HOME: Home free of loose throw rugs in walkways, pet beds, electrical cords, etc? Yes  Adequate lighting in your home to reduce risk of falls? Yes   ASSISTIVE DEVICES UTILIZED TO PREVENT FALLS: Life alert? No  Use of a cane, walker or w/c? No   TIMED UP AND GO: Was the test performed? No .   Cognitive Function: Patient is alert and oriented x3.         10/23/2021   12:44 PM 10/19/2020    9:59 AM 10/19/2019    8:49 AM  6CIT Screen  What Year? 0 points 0 points 0 points  What month? 0 points 0 points 0 points  What time? 0 points 0 points   Count back from 20  0 points   Months in reverse 0 points 4 points 4 points  Repeat phrase  2 points   Total Score  6 points     Immunizations Immunization History  Administered Date(s) Administered   Td 03/11/2004   Tdap 08/02/2014   Screening Tests Health Maintenance  Topic Date Due   URINE MICROALBUMIN  11/21/2021   Hepatitis C Screening  12/09/2021 (Originally 10/21/1983)   FOOT EXAM  11/21/2021   HEMOGLOBIN A1C  02/12/2022   OPHTHALMOLOGY EXAM  07/10/2022   TETANUS/TDAP  08/01/2024   COLONOSCOPY (Pts 45-38yr Insurance coverage will need to be confirmed)  08/23/2029   HIV Screening  Completed   HPV VACCINES  Aged Out   INFLUENZA VACCINE   Discontinued   COVID-19 Vaccine  Discontinued   Zoster Vaccines- Shingrix  Discontinued   Health Maintenance Health Maintenance Due  Topic Date Due   URINE MICROALBUMIN  11/21/2021   Lung Cancer Screening: completed 05/05/21.   Vision Screening: Recommended annual ophthalmology exams for early detection of glaucoma and other disorders of the eye.  Dental Screening: Recommended annual dental exams for proper oral hygiene  Community Resource Referral / Chronic Care Management: CRR required this visit?  No   CCM required this visit?  No      Plan:   Keep all routine maintenance appointments.   I have personally reviewed and noted the following in the patient's chart:   Medical and social history Use of alcohol, tobacco or illicit drugs  Current medications and supplements including opioid prescriptions. Patient is not currently taking opioid prescriptions. Functional ability and status Nutritional status Physical activity Advanced directives List of other physicians Hospitalizations, surgeries, and ER visits in previous 12 months Vitals Screenings to include  cognitive, depression, and falls Referrals and appointments  In addition, I have reviewed and discussed with patient certain preventive protocols, quality metrics, and best practice recommendations. A written personalized care plan for preventive services as well as general preventive health recommendations were provided to patient.     Varney Biles, LPN   05/04/1144

## 2021-11-02 MED FILL — Iron Sucrose Inj 20 MG/ML (Fe Equiv): INTRAVENOUS | Qty: 10 | Status: AC

## 2021-11-05 ENCOUNTER — Ambulatory Visit: Payer: Medicare Other | Admitting: Internal Medicine

## 2021-11-05 ENCOUNTER — Encounter: Payer: Self-pay | Admitting: Nurse Practitioner

## 2021-11-05 ENCOUNTER — Inpatient Hospital Stay (HOSPITAL_BASED_OUTPATIENT_CLINIC_OR_DEPARTMENT_OTHER): Payer: Medicare Other | Admitting: Nurse Practitioner

## 2021-11-05 ENCOUNTER — Inpatient Hospital Stay: Payer: Medicare Other

## 2021-11-05 ENCOUNTER — Inpatient Hospital Stay: Payer: Medicare Other | Attending: Internal Medicine

## 2021-11-05 ENCOUNTER — Other Ambulatory Visit: Payer: Self-pay

## 2021-11-05 VITALS — BP 116/76 | HR 103 | Temp 96.4°F | Resp 18 | Ht 64.0 in | Wt 177.5 lb

## 2021-11-05 DIAGNOSIS — E538 Deficiency of other specified B group vitamins: Secondary | ICD-10-CM

## 2021-11-05 DIAGNOSIS — D75839 Thrombocytosis, unspecified: Secondary | ICD-10-CM

## 2021-11-05 DIAGNOSIS — D500 Iron deficiency anemia secondary to blood loss (chronic): Secondary | ICD-10-CM | POA: Diagnosis not present

## 2021-11-05 LAB — CBC WITH DIFFERENTIAL/PLATELET
Abs Immature Granulocytes: 0.03 10*3/uL (ref 0.00–0.07)
Basophils Absolute: 0.1 10*3/uL (ref 0.0–0.1)
Basophils Relative: 1 %
Eosinophils Absolute: 0.1 10*3/uL (ref 0.0–0.5)
Eosinophils Relative: 1 %
HCT: 39.8 % (ref 39.0–52.0)
Hemoglobin: 12.5 g/dL — ABNORMAL LOW (ref 13.0–17.0)
Immature Granulocytes: 0 %
Lymphocytes Relative: 33 %
Lymphs Abs: 2.5 10*3/uL (ref 0.7–4.0)
MCH: 26 pg (ref 26.0–34.0)
MCHC: 31.4 g/dL (ref 30.0–36.0)
MCV: 82.7 fL (ref 80.0–100.0)
Monocytes Absolute: 0.6 10*3/uL (ref 0.1–1.0)
Monocytes Relative: 8 %
Neutro Abs: 4.2 10*3/uL (ref 1.7–7.7)
Neutrophils Relative %: 57 %
Platelets: 531 10*3/uL — ABNORMAL HIGH (ref 150–400)
RBC: 4.81 MIL/uL (ref 4.22–5.81)
RDW: 14.6 % (ref 11.5–15.5)
WBC: 7.4 10*3/uL (ref 4.0–10.5)
nRBC: 0 % (ref 0.0–0.2)

## 2021-11-05 LAB — BASIC METABOLIC PANEL
Anion gap: 8 (ref 5–15)
BUN: 21 mg/dL — ABNORMAL HIGH (ref 6–20)
CO2: 24 mmol/L (ref 22–32)
Calcium: 8.9 mg/dL (ref 8.9–10.3)
Chloride: 102 mmol/L (ref 98–111)
Creatinine, Ser: 0.99 mg/dL (ref 0.61–1.24)
GFR, Estimated: >60 mL/min (ref >60)
Glucose, Bld: 328 mg/dL — ABNORMAL HIGH (ref 70–99)
Potassium: 4 mmol/L (ref 3.5–5.1)
Sodium: 134 mmol/L — ABNORMAL LOW (ref 135–145)

## 2021-11-05 LAB — IRON AND TIBC
Iron: 31 ug/dL — ABNORMAL LOW (ref 45–182)
Saturation Ratios: 6 % — ABNORMAL LOW (ref 17.9–39.5)
TIBC: 504 ug/dL — ABNORMAL HIGH (ref 250–450)
UIBC: 473 ug/dL

## 2021-11-05 LAB — VITAMIN B12: Vitamin B-12: 226 pg/mL (ref 180–914)

## 2021-11-05 LAB — FERRITIN: Ferritin: 5 ng/mL — ABNORMAL LOW (ref 24–336)

## 2021-11-05 MED ORDER — CYANOCOBALAMIN 1000 MCG/ML IJ SOLN
1000.0000 ug | Freq: Once | INTRAMUSCULAR | Status: AC
  Administered 2021-11-05: 1000 ug via INTRAMUSCULAR
  Filled 2021-11-05: qty 1

## 2021-11-05 NOTE — Progress Notes (Signed)
[facility] CONSULT NOTE  Patient Care Team: Burnard Hawthorne, [name] as PCP - General (Family Medicine) [name], MD as PCP - Cardiology (Cardiology) Lloyd Huger, MD as Consulting Physician (Oncology)  CHIEF COMPLAINTS/PURPOSE OF CONSULTATION: Anemia sec to Hx of GIB  # ANEMIA sec to GIB [Dr.Finnegan; s/p EGD/colo- June 2021-s/p angiodysplasia small bowel/large bowel status post argon laser Dr.Wohl]; Venofer x4 in January 2022-for iron deficiency hemoglobin around 11.  # Thrombocytosis [JAK-2 NEG]  # B12 def- on B12 injections  Oncology History   No history exists.   HISTORY OF PRESENTING ILLNESS: alone walks with a cane.  Benjamin Conley 56 y.o.  male patient with a history of iron deficient anemia secondary to chronic GI bleed/angiodysplasia is here for follow-up.  Patient denies any blood in stools or black-colored stools. Is not on iron pills.  Not on B12.  Denies any fatigue.  Review of Systems  Constitutional:  Positive for malaise/fatigue. Negative for chills, diaphoresis, fever and weight loss.  HENT:  Negative for nosebleeds and sore throat.   Eyes:  Negative for double vision.  Respiratory:  Negative for cough, hemoptysis, sputum production, shortness of breath and wheezing.   Cardiovascular:  Negative for chest pain, palpitations, orthopnea and leg swelling.  Gastrointestinal:  Negative for abdominal pain, blood in stool, constipation, diarrhea, heartburn, melena, nausea and vomiting.  Genitourinary:  Negative for dysuria, frequency and urgency.  Musculoskeletal:  Positive for back pain and joint pain.  Skin: Negative.  Negative for itching and rash.  Neurological:  Negative for dizziness, tingling, focal weakness, weakness and headaches.  Endo/Heme/Allergies:  Does not bruise/bleed easily.  Psychiatric/Behavioral:  Negative for depression. The patient is not nervous/anxious and does not have insomnia.      MEDICAL HISTORY:  Past  Medical History:  Diagnosis Date   Acute posthemorrhagic anemia    Complication of anesthesia    c/o difficulty breathing after anesthesia   COPD (chronic obstructive pulmonary disease) (HCC)    Diabetes mellitus without complication ([place])    Hyperlipemia    Melena 08/21/2019   Paroxysmal supraventricular tachycardia (HCC) 08/19/2019   Postural dizziness with presyncope 08/21/2019   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   Tobacco abuse    Upper GI bleed 08/21/2019    SURGICAL HISTORY: Past Surgical History:  Procedure Laterality Date   BACK SURGERY     lumbar   COLONOSCOPY WITH PROPOFOL N/A 08/24/2019   Procedure: COLONOSCOPY WITH PROPOFOL;  Surgeon: [name], MD;  Location: [facility] ENDOSCOPY;  Service: Endoscopy;  Laterality: N/A;   ESOPHAGOGASTRODUODENOSCOPY (EGD) WITH PROPOFOL N/A 08/24/2019   Procedure: ESOPHAGOGASTRODUODENOSCOPY (EGD) WITH PROPOFOL;  Surgeon: [name], MD;  Location: [facility] ENDOSCOPY;  Service: Endoscopy;  Laterality: N/A;   STENT PLACEMENT VASCULAR ([place] HX)  07/2019   stenosis of distal splenic artery and stent placed   VISCERAL ANGIOGRAPHY N/A 08/06/2019   Procedure: VISCERAL ANGIOGRAPHY;  Surgeon: [name], MD;  Location: [facility];  Service: Cardiovascular;  Laterality: N/A;    SOCIAL HISTORY: Social History   Socioeconomic History   Marital status: Single    Spouse name: Not on file   Number of children: Not on file   Years of education: Not on file   Highest education level: Not on file  Occupational History   Not on file  Tobacco Use   Smoking status: Former    Packs/day: 2.00    Years: 48.00    Total pack years: 96.00    Types: Cigarettes  Quit date: 02/12/2020    Years since quitting: 1.7   Smokeless tobacco: Never  Vaping Use   Vaping Use: Never used  Substance and Sexual Activity   Alcohol use: No    Comment: Never been a problem   Drug use: No   Sexual activity: Yes  Other Topics Concern   Not on file  Social  History Narrative   Single, lives alone, unemployed.   Social Determinants of Health   Financial Resource Strain: Low Risk  (10/23/2021)   Overall Financial Resource Strain (CARDIA)    Difficulty of Paying Living Expenses: Not hard at all  Food Insecurity: No Food Insecurity (10/23/2021)   Hunger Vital Sign    Worried About Running Out of Food in the Last Year: Never true    Ran Out of Food in the Last Year: Never true  Transportation Needs: No Transportation Needs (10/23/2021)   PRAPARE - Hydrologist (Medical): No    Lack of Transportation (Non-Medical): No  Physical Activity: Inactive (07/18/2021)   Exercise Vital Sign    Days of Exercise per Week: 0 days    Minutes of Exercise per Session: 0 min  Stress: No Stress Concern Present (10/23/2021)   [place] of Stress : Not at all  Social Connections: Unknown (10/19/2020)   Social Connection and Isolation Panel [NHANES]    Frequency of Communication with Friends and Family: Not on file    Frequency of Social Gatherings with Friends and Family: More than three times a week    Attends Religious Services: Not on file    Active Member of Clubs or Organizations: No    Attends Archivist Meetings: Not on file    Marital Status: Not on file  Intimate Partner Violence: Not At Risk (10/23/2021)   Humiliation, Afraid, Rape, and Kick questionnaire    Fear of Current or Ex-Partner: No    Emotionally Abused: No    Physically Abused: No    Sexually Abused: No    FAMILY HISTORY: Family History  Problem Relation Age of Onset   Diabetes Mother    Diabetes Mellitus II Mother    Tuberculosis Father    Heart disease Father    Diabetes Sister    Diabetes Sister    Diabetes Sister    Diabetes Sister    Diabetes Brother    Diabetes Mellitus II Brother    Hyperlipidemia Brother    Cancer Maternal Aunt    Colon cancer Maternal  Uncle    Prostate cancer Neg Hx     ALLERGIES:  is allergic to wasp venom, wasp venom protein, coffee flavor, and onion.  MEDICATIONS:  Current Outpatient Medications  Medication Sig Dispense Refill   albuterol (VENTOLIN HFA) 108 (90 Base) MCG/ACT inhaler Inhale 2 puffs into the lungs every 6 (six) hours as needed for wheezing or shortness of breath. 6.7 g 3   aspirin 81 MG EC tablet Take 1 tablet (81 mg total) by mouth daily. 90 tablet 3   atorvastatin (LIPITOR) 40 MG tablet Take 1 tablet by mouth once daily 90 tablet 0   blood glucose meter kit and supplies Dispense based on patient and insurance preference. Use up to four times daily as directed. (FOR ICD-10 E10.9, E11.9). 1 each 0   Cholecalciferol (VITAMIN D) 125 MCG (5000 UT) CAPS Take 1 capsule by mouth daily.     clopidogrel (PLAVIX) 75 MG tablet  Take 1 tablet (75 mg total) by mouth daily. 90 tablet 3   Continuous Blood Gluc Sensor (FREESTYLE LIBRE 2 SENSOR) MISC 1 each by Does not apply route every 14 (fourteen) days. 2 each 4   digoxin (LANOXIN) 0.125 MG tablet Take 1 tablet (0.125 mg total) by mouth daily. 30 tablet 3   empagliflozin (JARDIANCE) 10 MG TABS tablet Take 1 tablet (10 mg total) by mouth daily before breakfast. 30 tablet 1   famotidine (PEPCID) 20 MG tablet Take 1 tablet (20 mg total) by mouth 2 (two) times daily.     fluticasone (FLONASE) 50 MCG/ACT nasal spray Place 2 sprays into both nostrils daily. 16 g 6   Insulin Pen Needle 32G X 4 MM MISC Use as directed to administer insulin 100 each 2   midodrine (PROAMATINE) 5 MG tablet Take 1.5 tablets (7.5 mg total) by mouth 3 (three) times daily with meals. 180 tablet 1   nystatin-triamcinolone ointment (MYCOLOG) Apply 1 application topically 2 (two) times daily. 60 g 0   Semaglutide,0.25 or 0.5MG/DOS, 2 MG/3ML SOPN Inject 0.25 mg into the skin once a week. After 4 weeks, increase to 0.96m [place] qwk 3 mL 2   sildenafil (VIAGRA) 100 MG tablet Take 1 tablet (100 mg total) by  mouth daily as needed for erectile dysfunction. 30 tablet 0   triamcinolone cream (KENALOG) 0.1 % Apply 1 application topically 2 (two) times daily. 30 g 0   No current facility-administered medications for this visit.      .[name] PHYSICAL EXAMINATION:   Vitals:   11/05/21 1330  BP: 116/76  Pulse: (!) 103  Resp: 18  Temp: (!) 96.4 F (35.8 C)   Filed Weights   11/05/21 1330  Weight: 177 lb 8 oz (80.5 kg)    Physical Exam HENT:     Head: Normocephalic and atraumatic.     Mouth/Throat:     Pharynx: No oropharyngeal exudate.  Eyes:     Pupils: Pupils are equal, round, and reactive to light.  Cardiovascular:     Rate and Rhythm: Normal rate and regular rhythm.  Pulmonary:     Effort: Pulmonary effort is normal. No respiratory distress.     Breath sounds: Normal breath sounds. No wheezing.  Abdominal:     General: Bowel sounds are normal. There is no distension.     Palpations: Abdomen is soft. There is no mass.     Tenderness: There is no abdominal tenderness. There is no guarding or rebound.  Musculoskeletal:        General: No tenderness. Normal range of motion.     Cervical back: Normal range of motion and neck supple.  Skin:    General: Skin is warm.  Neurological:     Mental Status: He is alert and oriented to person, place, and time.  Psychiatric:        Mood and Affect: Affect normal.      LABORATORY DATA:  I have reviewed the data as listed Lab Results  Component Value Date   WBC 7.4 11/05/2021   HGB 12.5 (L) 11/05/2021   HCT 39.8 11/05/2021   MCV 82.7 11/05/2021   PLT 531 (H) 11/05/2021   Recent Labs    02/23/21 1531 02/23/21 1531 03/08/21 1403 05/07/21 1251 08/13/21 1015 11/05/21 1247  NA 137   < > 140 137 141 134*  K 4.4  --  3.9 4.0 3.8 4.0  CL 100  --  105 104 106 102  CO2 25  --  28 26 28 24   GLUCOSE 126*   < > 193* 186* 157* 328*  BUN 13   < > 14 13 12  21*  CREATININE 0.79  --  0.73 0.63 0.71 0.99  CALCIUM 9.7  --  8.8 8.7* 9.0 8.9   GFRNONAA  --   --   --  >60  --  >60  PROT 7.0  --  6.4  --  7.2  --   ALBUMIN 4.5  --  3.9  --  4.0  --   AST 17  --  13  --  13  --   ALT 25  --  18  --  17  --   ALKPHOS 76  --  78  --  80  --   BILITOT 0.4  --  0.3  --  0.3  --    < > = values in this interval not displayed.     RADIOGRAPHIC STUDIES: I have personally reviewed the radiological images as listed and agreed with the findings in the report. No results found.  ASSESSMENT & PLAN:   No problem-specific Assessment & Plan notes found for this encounter.  Iron deficiency anemia due to chronic blood loss # Iron deficiency anemia- hemoglobin is stable but reduced. Ferritin 61, iron sat 15%. Levels pending today. Hold venofer today.    # Etiology- EGD/Colo [Dr.Wohl] June 2021 revealed a total of 4 angiodysplastic lesions that were cauterized with argon laser. Monitor closely. Clinically, asymptomatic.    # Thrombocytosis- 531; JAK2 mutation negative; question other MPN Vs-reactive secondary deficiency.  Monitor.    # B12 deficiency: b12 220 in April 2022. Pending today. Proceed with 1000 mcg intramuscular B12 today and b12 monthly.    # DISPOSITION: B12 today. Hold venofer Monthly - b12 6 mo- lab (cbc, cmp, ferritin, iron studies, b12) Few days later see Dr. Grayland Ormond or APP, +/- venofer and b12- la  All questions were answered. The patient knows to call the clinic with any problems, questions or concerns.  Verlon Au, NP 11/05/2021

## 2021-11-06 ENCOUNTER — Other Ambulatory Visit: Payer: Self-pay

## 2021-11-06 DIAGNOSIS — D500 Iron deficiency anemia secondary to blood loss (chronic): Secondary | ICD-10-CM

## 2021-11-08 DIAGNOSIS — R42 Dizziness and giddiness: Secondary | ICD-10-CM | POA: Diagnosis not present

## 2021-11-13 ENCOUNTER — Ambulatory Visit (INDEPENDENT_AMBULATORY_CARE_PROVIDER_SITE_OTHER): Payer: Medicare Other | Admitting: Family

## 2021-11-13 ENCOUNTER — Encounter: Payer: Self-pay | Admitting: Family

## 2021-11-13 VITALS — BP 118/80 | HR 97 | Temp 97.6°F | Ht 64.0 in | Wt 179.4 lb

## 2021-11-13 DIAGNOSIS — I951 Orthostatic hypotension: Secondary | ICD-10-CM

## 2021-11-13 DIAGNOSIS — E119 Type 2 diabetes mellitus without complications: Secondary | ICD-10-CM | POA: Diagnosis not present

## 2021-11-13 DIAGNOSIS — I10 Essential (primary) hypertension: Secondary | ICD-10-CM

## 2021-11-13 DIAGNOSIS — E785 Hyperlipidemia, unspecified: Secondary | ICD-10-CM

## 2021-11-13 LAB — POCT GLYCOSYLATED HEMOGLOBIN (HGB A1C): Hemoglobin A1C: 7.8 % — AB (ref 4.0–5.6)

## 2021-11-13 LAB — MICROALBUMIN / CREATININE URINE RATIO
Creatinine,U: 55 mg/dL
Microalb Creat Ratio: 1.3 mg/g (ref 0.0–30.0)
Microalb, Ur: <0.7 mg/dL (ref 0.0–1.9)

## 2021-11-13 LAB — LIPID PANEL
Cholesterol: 147 mg/dL (ref 0–200)
HDL: 43.5 mg/dL (ref >39.00)
LDL Cholesterol: 79 mg/dL (ref 0–99)
NonHDL: 103.41
Total CHOL/HDL Ratio: 3
Triglycerides: 123 mg/dL (ref 0.0–149.0)
VLDL: 24.6 mg/dL (ref 0.0–40.0)

## 2021-11-13 LAB — LDL CHOLESTEROL, DIRECT: Direct LDL: 89 mg/dL

## 2021-11-13 MED ORDER — SEMAGLUTIDE(0.25 OR 0.5MG/DOS) 2 MG/3ML ~~LOC~~ SOPN: 0.5000 mg | PEN_INJECTOR | SUBCUTANEOUS | 2 refills | Status: DC

## 2021-11-13 NOTE — Progress Notes (Signed)
Discussed during OV. Please see OV notes

## 2021-11-13 NOTE — Progress Notes (Signed)
Subjective:    Patient ID: Benjamin Conley, male    DOB: 02-01-1966, 56 y.o.   MRN: [id number]  CC: Benjamin Conley is a 56 y.o. male who presents today for follow up.   HPI:  He describes another episode of dizziness after doing yard work on [employer].  Lasted for 4-5 minutes then resolved with rest. 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 He noticed episodes as well triggered by looking up.  He welds outside.   No syncope, vision loss.    He is compliant with midodrine 7.47m once to tiwce with some relief.   DM-compliant with Ozempic 0.25 mg for 2 weeks; compliant Jardiance 10 mg.  No constipation or nausea  Follow up with hematology for iron deficient anemia 11/05/2021.  Held Venofer today.  Consult with Dr BRichardson Landry 10/25/21 regarding concern for CSF leak, dizziness. Patient reports normal vestibular test.  Appointment in symptoms of dizziness could be from poor cerebrovascular blood flow, microvascular changes seen on MRI. MRA 06/2021: IMPRESSION: 1. Moderate to severe narrowing of the right proximal supraclinoid ICA and mild narrowing of the distal right cavernous ICA. 2. Hypoplastic and/or stenotic right A1, with a patent dominant left A1. Consult with neurosurgery , Dr ZFrancesca Oman7/2023 regarding narrowing of ICA.  From a neurosurgical standpoint, no surgery indicated.   Xr cervical spine 09/2021: C7 difficult to visualized on lateral views. Mild cervicothoracic spine scoliosis concave right. C5-C6 degenerative change. No acute bony abnormality identified.  07/2021: uKoreacarotid without evidence of plaque or stenosis bilaterally.   MRI brain 08/2019:  IMPRESSION: 1. No acute intracranial abnormality. 2. Periventricular and scattered subcortical T2 hyperintensities bilaterally are moderately advanced for age. The finding is nonspecific but can be seen in the setting of chronic microvascular ischemia, a demyelinating process such as multiple  sclerosis, vasculitis, complicated migraine headaches, or as the sequelae of a prior infectious or inflammatory process.   HISTORY:  Past Medical History:  Diagnosis Date  . Acute posthemorrhagic anemia   . Complication of anesthesia    c/o difficulty breathing after anesthesia  . COPD (chronic obstructive pulmonary disease) ([place]   . Diabetes mellitus without complication ([place]   . Hyperlipemia   . Melena 08/21/2019  . Paroxysmal supraventricular tachycardia ([place] 08/19/2019  . Postural dizziness with presyncope 08/21/2019  . 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 Tobacco abuse   . Upper GI bleed 08/21/2019   Past Surgical History:  Procedure Laterality Date  . BACK SURGERY     lumbar  . COLONOSCOPY WITH PROPOFOL N/A 08/24/2019   Procedure: COLONOSCOPY WITH PROPOFOL;  Surgeon: WLucilla Lame MD;  [facility];  Service: Endoscopy;  Laterality: N/A;  . ESOPHAGOGASTRODUODENOSCOPY (EGD) WITH PROPOFOL N/A 08/24/2019   Procedure: ESOPHAGOGASTRODUODENOSCOPY (EGD) WITH PROPOFOL;  Surgeon: WLucilla Lame MD;  [facility];  Service: Endoscopy;  Laterality: N/A;  . [place])  07/2019   stenosis of distal splenic artery and stent placed  . VISCERAL ANGIOGRAPHY N/A 08/06/2019   Procedure: VISCERAL ANGIOGRAPHY;  Surgeon: SKatha Cabal MD;  [facility];  Service: Cardiovascular;  Laterality: N/A;   Family History  Problem Relation Age of Onset  . Diabetes Mother   . Diabetes Mellitus II Mother   . Tuberculosis Father   . Heart disease Father   . Diabetes Sister   . Diabetes Sister   . Diabetes Sister   . Diabetes Sister   . Diabetes Brother   .  Diabetes Mellitus II Brother   . Hyperlipidemia Brother   . Cancer Maternal Aunt   . Colon cancer Maternal Uncle   . Prostate cancer Neg Hx     Allergies: Wasp venom, Wasp venom protein, Coffee flavor, and Onion Current Outpatient Medications on File Prior to Visit  Medication Sig  Dispense Refill  . albuterol (VENTOLIN HFA) 108 (90 Base) MCG/ACT inhaler Inhale 2 puffs into the lungs every 6 (six) hours as needed for wheezing or shortness of breath. 6.7 g 3  . aspirin 81 MG EC tablet Take 1 tablet (81 mg total) by mouth daily. 90 tablet 3  . atorvastatin (LIPITOR) 40 MG tablet Take 1 tablet by mouth once daily 90 tablet 0  . blood glucose meter kit and supplies Dispense based on patient and insurance preference. Use up to four times daily as directed. (FOR ICD-10 E10.9, E11.9). 1 each 0  . Cholecalciferol (VITAMIN D) 125 MCG (5000 UT) CAPS Take 1 capsule by mouth daily.    . clopidogrel (PLAVIX) 75 MG tablet Take 1 tablet (75 mg total) by mouth daily. 90 tablet 3  . Continuous Blood Gluc Sensor (FREESTYLE LIBRE 2 SENSOR) MISC 1 each by Does not apply route every 14 (fourteen) days. 2 each 4  . digoxin (LANOXIN) 0.125 MG tablet Take 1 tablet (0.125 mg total) by mouth daily. 30 tablet 3  . empagliflozin (JARDIANCE) 10 MG TABS tablet Take 1 tablet (10 mg total) by mouth daily before breakfast. 30 tablet 1  . famotidine (PEPCID) 20 MG tablet Take 1 tablet (20 mg total) by mouth 2 (two) times daily.    . fluticasone (FLONASE) 50 MCG/ACT nasal spray Place 2 sprays into both nostrils daily. 16 g 6  . Insulin Pen Needle 32G X 4 MM MISC Use as directed to administer insulin 100 each 2  . midodrine (PROAMATINE) 5 MG tablet Take 1.5 tablets (7.5 mg total) by mouth 3 (three) times daily with meals. 180 tablet 1  . nystatin-triamcinolone ointment (MYCOLOG) Apply 1 application topically 2 (two) times daily. 60 g 0  . sildenafil (VIAGRA) 100 MG tablet Take 1 tablet (100 mg total) by mouth daily as needed for erectile dysfunction. 30 tablet 0  . triamcinolone cream (KENALOG) 0.1 % Apply 1 application topically 2 (two) times daily. 30 g 0   No current facility-administered medications on file prior to visit.    Social History   Tobacco Use  . Smoking status: Former    Packs/day: 2.00     Years: 48.00    Total pack years: 96.00    Types: Cigarettes    Quit date: 02/12/2020    Years since quitting: 1.7  . Smokeless tobacco: Never  Vaping Use  . Vaping Use: Never used  Substance Use Topics  . Alcohol use: No    Comment: Never been a problem  . Drug use: No    Review of Systems  Constitutional:  Negative for chills and fever.  Eyes:  Negative for visual disturbance.  Respiratory:  Negative for cough.   Cardiovascular:  Negative for chest pain and palpitations.  Gastrointestinal:  Negative for nausea and vomiting.  Neurological:  Positive for dizziness. Negative for syncope.      Objective:    BP 118/80 (BP Location: Left Arm, Patient Position: Sitting, Cuff Size: Normal)   Pulse 97   Temp 97.6 F (36.4 C) (Oral)   Ht 5' 4"  (1.626 m)   Wt 179 lb 6.4 oz (81.4 kg)   SpO2  97%   BMI 30.79 kg/m  BP Readings from Last 3 Encounters:  11/13/21 118/80  11/05/21 116/76  10/19/21 138/82   Wt Readings from Last 3 Encounters:  11/13/21 179 lb 6.4 oz (81.4 kg)  11/05/21 177 lb 8 oz (80.5 kg)  10/23/21 179 lb (81.2 kg)    Physical Exam Vitals reviewed.  Constitutional:      Appearance: He is well-developed.  Cardiovascular:     Rate and Rhythm: Regular rhythm.     Heart sounds: Normal heart sounds.  Pulmonary:     Effort: Pulmonary effort is normal. No respiratory distress.     Breath sounds: Normal breath sounds. No wheezing, rhonchi or rales.  Skin:    General: Skin is warm and dry.  Neurological:     Mental Status: He is alert.  Psychiatric:        Speech: Speech normal.        Behavior: Behavior normal.       Assessment & Plan:   Problem List Items Addressed This Visit       Cardiovascular and Mediastinum   Hypertension - Primary   Relevant Orders   Urine Microalbumin w/creat. ratio   Orthostatic hypotension    Chronic, stable.  Extensive review with patient in regards to work-up thus far including MRA, ultrasound carotid, carotid  ultrasound, recent consult with ENT. Discussed microvascular changes seen brain MRI in setting of longstanding diabetes, hypertension, previous history of smoking and suspect contributory.  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  Counseled the importance of being very careful with positional changes and heat.  Blood pressure on the low end.  He revealed today he has been taking midodrine 7.5 mg once maybe twice per day.  We jointly agreed to increase to 3 times daily as prescribed to see if dizziness improves.  We also agreed second opinion with neurology, Dr. Melrose Nakayama reasonable.  Referral has been placed.  Patient will let me know how he is doing. Case reviewed with supervising, Dr Deborra Medina, and she and I jointly agreed on management plan.        Relevant Orders   Ambulatory referral to Neurology     Endocrine   Diabetes mellitus without complication [facility])    Lab Results  Component Value Date   HGBA1C 7.8 (A) 11/13/2021  Uncontrolled.  Increase Ozempic to 0.5 mg, continue Jardiance 10 mg.      Relevant Medications   Semaglutide,0.25 or 0.5MG/DOS, 2 MG/3ML SOPN   Other Relevant Orders   Urine Microalbumin w/creat. ratio   POCT HgB A1C (Completed)     Other   Hyperlipidemia   Relevant Orders   Lipid Profile   Direct LDL     I have changed Benjamin Conley's Semaglutide(0.25 or 0.5MG/DOS). I am also having him maintain his sildenafil, blood glucose meter kit and supplies, nystatin-triamcinolone ointment, clopidogrel, aspirin EC, albuterol, Insulin Pen Needle, triamcinolone cream, Vitamin D, digoxin, fluticasone, FreeStyle Libre 2 Sensor, famotidine, atorvastatin, empagliflozin, and midodrine.   Meds ordered this encounter  Medications  . Semaglutide,0.25 or 0.5MG/DOS, 2 MG/3ML SOPN    Sig: Inject 0.5 mg into the skin once a week.    Dispense:  3 mL    Refill:  2     Order Specific Question:   Supervising Provider    Answer:   Deborra Medina L [[id number]]  Return precautions given.   Risks, benefits, and alternatives of the medications and treatment plan prescribed today were discussed, and patient expressed understanding.   Education regarding symptom management and diagnosis given to patient on AVS.  Continue to follow with Benjamin Hawthorne, [name] for routine health maintenance.   Benjamin Conley and I agreed with plan.   [name], [name]

## 2021-11-13 NOTE — Patient Instructions (Signed)
As discussed, increase midodrine 7.5 mg from once or twice per day to 3 times per day as prescribed.  Please monitor blood pressure and ensure does not go above 140/90.  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  Please stay very hydrated.  Referral to neurology, Dr. Melrose Nakayama Let us know if you dont hear back within a week in regards to an appointment being scheduled.

## 2021-11-13 NOTE — Assessment & Plan Note (Addendum)
Chronic, stable.  Extensive review with patient in regards to work-up thus far including MRA, ultrasound carotid, carotid ultrasound, recent consult with ENT. Discussed microvascular changes seen brain MRI in setting of longstanding diabetes, hypertension, previous history of smoking and suspect contributory.  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  Counseled the importance of being very careful with positional changes and heat.  Blood pressure on the low end.  He revealed today he has been taking midodrine 7.5 mg once maybe twice per day.  We jointly agreed to increase to 3 times daily as prescribed to see if dizziness improves.  We also agreed second opinion with neurology, Dr. Melrose Nakayama reasonable.  Referral has been placed.  Patient will let me know how he is doing. Case reviewed with supervising, Dr Deborra Medina, and she and I jointly agreed on management plan.

## 2021-11-13 NOTE — Assessment & Plan Note (Signed)
Lab Results  Component Value Date   HGBA1C 7.8 (A) 11/13/2021   Uncontrolled.  Increase Ozempic to 0.5 mg, continue Jardiance 10 mg.

## 2021-11-14 ENCOUNTER — Encounter: Payer: Self-pay | Admitting: Family

## 2021-11-15 ENCOUNTER — Other Ambulatory Visit: Payer: Self-pay

## 2021-11-15 DIAGNOSIS — E785 Hyperlipidemia, unspecified: Secondary | ICD-10-CM

## 2021-11-15 MED ORDER — ATORVASTATIN CALCIUM 80 MG PO TABS: 80.0000 mg | ORAL_TABLET | Freq: Every day | ORAL | 3 refills | Status: DC

## 2021-11-15 NOTE — Telephone Encounter (Signed)
Patient has been scheduled for lab appointment on 12/27/2021.

## 2021-11-16 NOTE — Telephone Encounter (Signed)
Called patient and scheduled him for follow up appt and recheck of A1C

## 2021-11-23 ENCOUNTER — Other Ambulatory Visit: Payer: Self-pay | Admitting: Family

## 2021-11-23 DIAGNOSIS — E119 Type 2 diabetes mellitus without complications: Secondary | ICD-10-CM

## 2021-11-24 ENCOUNTER — Encounter: Payer: Self-pay | Admitting: Family

## 2021-11-27 ENCOUNTER — Other Ambulatory Visit: Payer: Self-pay

## 2021-11-27 DIAGNOSIS — E119 Type 2 diabetes mellitus without complications: Secondary | ICD-10-CM

## 2021-11-27 MED ORDER — FREESTYLE LIBRE 2 SENSOR MISC: 1.0000 | 4 refills | Status: DC

## 2021-11-27 MED ORDER — EMPAGLIFLOZIN 10 MG PO TABS: 10.0000 mg | ORAL_TABLET | Freq: Every day | ORAL | 1 refills | Status: DC

## 2021-11-27 NOTE — Telephone Encounter (Signed)
Rx sent 

## 2021-12-03 NOTE — Telephone Encounter (Signed)
Pt scheduled for appointment with Dr. Olivia Mackie tomorrow.

## 2021-12-04 ENCOUNTER — Ambulatory Visit (INDEPENDENT_AMBULATORY_CARE_PROVIDER_SITE_OTHER): Payer: Medicare Other | Admitting: Internal Medicine

## 2021-12-04 ENCOUNTER — Ambulatory Visit (INDEPENDENT_AMBULATORY_CARE_PROVIDER_SITE_OTHER): Payer: Medicare Other

## 2021-12-04 ENCOUNTER — Encounter: Payer: Self-pay | Admitting: Internal Medicine

## 2021-12-04 VITALS — BP 124/78 | HR 98 | Temp 97.4°F | Ht 64.0 in | Wt 177.8 lb

## 2021-12-04 DIAGNOSIS — S91112A Laceration without foreign body of left great toe without damage to nail, initial encounter: Secondary | ICD-10-CM

## 2021-12-04 DIAGNOSIS — S99922A Unspecified injury of left foot, initial encounter: Secondary | ICD-10-CM | POA: Diagnosis not present

## 2021-12-04 DIAGNOSIS — S6992XA Unspecified injury of left wrist, hand and finger(s), initial encounter: Secondary | ICD-10-CM | POA: Diagnosis not present

## 2021-12-04 DIAGNOSIS — S61112A Laceration without foreign body of left thumb with damage to nail, initial encounter: Secondary | ICD-10-CM | POA: Diagnosis not present

## 2021-12-04 MED ORDER — DOXYCYCLINE HYCLATE 100 MG PO TABS: 100.0000 mg | ORAL_TABLET | Freq: Two times a day (BID) | ORAL | 0 refills | Status: DC

## 2021-12-04 MED ORDER — MUPIROCIN 2 % EX OINT: 1.0000 | TOPICAL_OINTMENT | Freq: Two times a day (BID) | CUTANEOUS | 0 refills | Status: DC

## 2021-12-04 NOTE — Patient Instructions (Signed)
Wound Care, Adult Taking care of your wound properly can help to prevent pain, infection, and scarring. It can also help your wound heal more quickly. Follow instructions from your health care provider about how to care for your wound. Supplies needed: Soap and water. Wound cleanser, saline, or germ-free (sterile) water. Gauze. If needed, a clean bandage (dressing) or other type of wound dressing material to cover or place in the wound. Follow your health care provider's instructions about what dressing supplies to use. Cream or topical ointment to apply to the wound, if told by your health care provider. How to care for your wound Cleaning the wound Ask your health care provider how to clean the wound. This may include: Using mild soap and water, a wound cleanser, saline, or sterile water. Using a clean gauze to pat the wound dry after cleaning it. Do not rub or scrub the wound. Dressing care Wash your hands with soap and water for at least 20 seconds before and after you change the dressing. If soap and water are not available, use hand sanitizer. Change your dressing as told by your health care provider. This may include: Cleaning or rinsing out (irrigating) the wound. Application of cream or topical ointment, if told by your health care provider. Placing a dressing over the wound or in the wound (packing). Covering the wound with an outer dressing. Leave stitches (sutures), staples, skin glue, or adhesive strips in place. These skin closures may need to stay in place for 2 weeks or longer. If adhesive strip edges start to loosen and curl up, you may trim the loose edges. Do not remove adhesive strips completely unless your health care provider tells you to do that. Ask your health care provider when you can leave the wound uncovered. Checking for infection Check your wound area every day for signs of infection. Check for: More redness, swelling, or pain. Fluid or blood. Warmth. Pus or  a bad smell.  Follow these instructions at home Medicines If you were prescribed an antibiotic medicine, cream, or ointment, take or apply it as told by your health care provider. Do not stop using the antibiotic even if your condition improves. If you were prescribed pain medicine, take it 30 minutes before you do any wound care or as told by your health care provider. Take over-the-counter and prescription medicines only as told by your health care provider. Eating and drinking Eat a diet that includes protein, vitamin A, vitamin C, and other nutrient-rich foods to help the wound heal. Foods rich in protein include meat, fish, eggs, dairy, beans, and nuts. Foods rich in vitamin A include carrots and dark green, leafy vegetables. Foods rich in vitamin C include citrus fruits, tomatoes, broccoli, and peppers. Drink enough fluid to keep your urine pale yellow. General instructions Do not take baths, swim, or use a hot tub until your health care provider approves. Ask your health care provider if you may take showers. You may only be allowed to take sponge baths. Do not scratch or pick at the wound. Keep it covered as told by your health care provider. Return to your normal activities as told by your health care provider. Ask your health care provider what activities are safe for you. Protect your wound from the sun when you are outside for the first 6 months, or for as long as told by your health care provider. Cover up the scar area or apply sunscreen that has an SPF of at least 30. Do not   use any products that contain nicotine or tobacco. These products include cigarettes, chewing tobacco, and vaping devices, such as e-cigarettes. If you need help quitting, ask your health care provider. Keep all follow-up visits. This is important. Contact a health care provider if: You received a tetanus shot and you have swelling, severe pain, redness, or bleeding at the injection site. Your pain is not  controlled with medicine. You have any of these signs of infection: More redness, swelling, or pain around the wound. Fluid or blood coming from the wound. Warmth coming from the wound. A fever or chills. You are nauseous or you vomit. You are dizzy. You have a new rash or hardness around the wound. Get help right away if: You have a red streak of skin near the area around your wound. Pus or a bad smell coming from the wound. Your wound has been closed with staples, sutures, skin glue, or adhesive strips and it begins to open up and separate. Your wound is bleeding, and the bleeding does not stop with gentle pressure. These symptoms may represent a serious problem that is an emergency. Do not wait to see if the symptoms will go away. Get medical help right away. Call your local emergency services (911 in the [place] Do not drive yourself to the hospital. Summary Always wash your hands with soap and water for at least 20 seconds before and after changing your dressing. Change your dressing as told by your health care provider. To help with healing, eat foods that are rich in protein, vitamin A, vitamin C, and other nutrients. Check your wound every day for signs of infection. Contact your health care provider if you think that your wound is infected. This information is not intended to replace advice given to you by your health care provider. Make sure you discuss any questions you have with your health care provider. Document Revised: 07/04/2020 Document Reviewed: 07/04/2020 [employer] Patient Education  [place], Adult A laceration is a cut that may go through all layers of the skin and into the tissue that is right under the skin. Some lacerations heal on their own. Others need to be closed with stitches (sutures), staples, skin adhesive strips, or skin glue. Proper care of a laceration reduces the risk for infection, helps the laceration heal better, and may  prevent scarring. General tips Keep the wound clean and dry. Do not scratch or pick at the wound. Wash your hands with soap and water for at least 20 seconds before and after touching your wound or changing your bandage (dressing). If soap and water are not available, use hand sanitizer. Do not usedisinfectants or antiseptics, such as rubbing alcohol, to clean your wound unless told by your health care provider. If you were given a dressing, you should change it at least once a day, or as told by your health care provider. You should also change it if it becomes wet or dirty. How to care for your laceration If sutures or staples were used: Keep the wound completely dry for the first 24 hours, or as told by your health care provider. After that time, you may shower or bathe. Do not soak your wound in water until after the sutures or staples have been removed. Clean the wound once each day, or as told by your health care provider. To do this: Wash the wound with soap and water. Rinse the wound with water to remove all soap. Pat the wound dry with  a clean towel. Do not rub the wound. After cleaning the wound, apply a thin layer of antibiotic ointment, other topical ointments, or a non-adherent dressing as told by your health care provider. This will help prevent infection and keep the dressing from sticking to the wound. Have the sutures or staples removed as told by your health care provider. Do not  remove sutures or staples yourself. If skin adhesive strips were used: Do not get the skin adhesive strips wet. You may shower or bathe, but keep the wound dry. If the wound gets wet, pat it dry with a clean towel. Do not rub the wound. Skin adhesive strips fall off on their own. If adhesive strip edges start to loosen and curl up, you may trim the loose edges. Do not remove adhesive strips completely unless your health care provider tells you to do that. If skin glue was used: You may shower or  bathe, but try to keep the wound dry. Do not soak the wound in water. After showering or bathing, pat the wound dry with a clean towel. Do not rub the wound. Do not do any activities that will make you sweat a lot until the skin glue has fallen off. Do not apply liquid, cream, or ointment medicine to the wound while the skin glue is in place. Doing this may loosen the film before the wound has healed. If a dressing is placed over the wound, do not apply tape directly over the skin glue. Doing this may cause the glue to be pulled off before the wound has healed. Do not pick at the glue. Skin glue usually remains in place for 5-10 days and then falls off the skin. Follow these instructions at home: Medicines Take over-the-counter and prescription medicines only as told by your health care provider. If you were prescribed an antibiotic medicine or ointment, take or apply it as told by your health care provider. Do not stop using it even if your condition improves. Managing pain and swelling If directed, put ice on the injured area. To do this: Put ice in a plastic bag. Place a towel between your skin and the bag. Leave the ice on for 20 minutes, 2-3 times a day. Remove the ice if your skin turns bright red. This is very important. If you cannot feel pain, heat, or cold, you have a greater risk of damage to the area. Raise (elevate) the injured area above the level of your heart while you are sitting or lying down for the first 24-48 hours after the laceration is repaired. General instructions  Avoid any activity that could cause your wound to reopen. Check your wound every day for signs of infection. Watch for: More redness, swelling, or pain. Fluid or blood. Warmth. Pus or a bad smell. Keep all follow-up visits. This is important. Contact a health care provider if: You received a tetanus shot and you have swelling, severe pain, redness, or bleeding at the injection site. Your closed wound  breaks open. You have any of these signs of infection: More redness, swelling, or pain around your wound. Fluid or blood coming from your wound. Warmth coming from your wound. Pus or a bad smell coming from your wound. A fever. You notice something coming out of the wound, such as wood or glass. Your pain is not controlled with medicine. You notice a change in the color of your skin near your wound. You need to change the dressing often. You develop a new rash.  You have numbness around the wound. Get help right away if: You develop severe swelling around the wound. Your pain suddenly increases and is severe. You develop painful lumps near the wound or on skin anywhere else on your body. You have a red streak going away from your wound. The wound is on your hand or foot, and you cannot properly move a finger or toe. The wound is on your hand or foot, and you notice that your fingers or toes look pale or bluish. Summary A laceration is a cut that may go through all layers of the skin and into the tissue that is right under the skin. Some lacerations heal on their own. Others need to be closed with stitches (sutures), staples, skin adhesive strips, or skin glue. Proper care of a laceration reduces the risk of infection, helps the laceration heal better, and may prevent scarring. This information is not intended to replace advice given to you by your health care provider. Make sure you discuss any questions you have with your health care provider. Document Revised: 05/04/2020 Document Reviewed: 05/04/2020 [employer] Patient Education  [place].

## 2021-12-04 NOTE — Progress Notes (Signed)
Chief Complaint  Patient presents with   Laceration    Pt has cut his finger (thumb) on a radiator fan. Pt states his big toe nail fell off as well on the left foot.   F/u  1. Left thumb injury Sunday cut in fan blade w/o pain wound is deep  2. Left toenail came off while clipping > 1 week ago  Nothing tried for either  He is not in pain today    Review of Systems  Constitutional:  Negative for weight loss.  HENT:  Negative for hearing loss.   Eyes:  Negative for blurred vision.  Respiratory:  Negative for shortness of breath.   Cardiovascular:  Negative for chest pain.  Gastrointestinal:  Negative for abdominal pain and blood in stool.  Musculoskeletal:  Negative for back pain.  Skin:  Negative for rash.       +open wounds thumb and left great toenail   Neurological:  Negative for headaches.  Psychiatric/Behavioral:  Negative for depression.    Past Medical History:  Diagnosis Date   Acute posthemorrhagic anemia    Complication of anesthesia    c/o difficulty breathing after anesthesia   COPD (chronic obstructive pulmonary disease) (HCC)    Diabetes mellitus without complication ([place])    Hyperlipemia    Melena 08/21/2019   Paroxysmal supraventricular tachycardia (HCC) 08/19/2019   Postural dizziness with presyncope 08/21/2019   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   Tobacco abuse    Upper GI bleed 08/21/2019   Past Surgical History:  Procedure Laterality Date   BACK SURGERY     lumbar   COLONOSCOPY WITH PROPOFOL N/A 08/24/2019   Procedure: COLONOSCOPY WITH PROPOFOL;  Surgeon: [name], MD;  Location: [facility] ENDOSCOPY;  Service: Endoscopy;  Laterality: N/A;   ESOPHAGOGASTRODUODENOSCOPY (EGD) WITH PROPOFOL N/A 08/24/2019   Procedure: ESOPHAGOGASTRODUODENOSCOPY (EGD) WITH PROPOFOL;  Surgeon: [name], MD;  Location: [facility] ENDOSCOPY;  Service: Endoscopy;  Laterality: N/A;   STENT PLACEMENT VASCULAR ([place] [facility])  07/2019   stenosis of distal splenic artery and stent placed   VISCERAL  ANGIOGRAPHY N/A 08/06/2019   Procedure: VISCERAL ANGIOGRAPHY;  Surgeon: [name], MD;  Location: [place] [facility];  Service: Cardiovascular;  Laterality: N/A;   Family History  Problem Relation Age of Onset   Diabetes Mother    Diabetes Mellitus II Mother    Tuberculosis Father    Heart disease Father    Diabetes Sister    Diabetes Sister    Diabetes Sister    Diabetes Sister    Diabetes Brother    Diabetes Mellitus II Brother    Hyperlipidemia Brother    Cancer Maternal Aunt    Colon cancer Maternal Uncle    Prostate cancer Neg Hx    Social History   Socioeconomic History   Marital status: Single    Spouse name: Not on file   Number of children: Not on file   Years of education: Not on file   Highest education level: Not on file  Occupational History   Not on file  Tobacco Use   Smoking status: Former    Packs/day: 2.00    Years: 48.00    Total pack years: 96.00    Types: Cigarettes    Quit date: 02/12/2020    Years since quitting: 1.8   Smokeless tobacco: Never  Vaping Use   Vaping Use: Never used  Substance and Sexual Activity   Alcohol use: No    Comment: Never been a problem  Drug use: No   Sexual activity: Yes  Other Topics Concern   Not on file  Social History Narrative   Single, lives alone, unemployed.   Social Determinants of Health   Financial Resource Strain: Low Risk  (10/23/2021)   Overall Financial Resource Strain (CARDIA)    Difficulty of Paying Living Expenses: Not hard at all  Food Insecurity: No Food Insecurity (10/23/2021)   Hunger Vital Sign    Worried About Running Out of Food in the Last Year: Never true    Ran Out of Food in the Last Year: Never true  Transportation Needs: No Transportation Needs (10/23/2021)   PRAPARE - Hydrologist (Medical): No    Lack of Transportation (Non-Medical): No  Physical Activity: Inactive (07/18/2021)   Exercise Vital Sign    Days of Exercise per Week: 0 days     Minutes of Exercise per Session: 0 min  Stress: No Stress Concern Present (10/23/2021)   [place]    Feeling of Stress : Not at all  Social Connections: Unknown (10/19/2020)   Social Connection and Isolation Panel [NHANES]    Frequency of Communication with Friends and Family: Not on file    Frequency of Social Gatherings with Friends and Family: More than three times a week    Attends Religious Services: Not on file    Active Member of Clubs or Organizations: No    Attends Archivist Meetings: Not on file    Marital Status: Not on file  Intimate Partner Violence: Not At Risk (10/23/2021)   Humiliation, Afraid, Rape, and Kick questionnaire    Fear of Current or Ex-Partner: No    Emotionally Abused: No    Physically Abused: No    Sexually Abused: No   Current Meds  Medication Sig   aspirin 81 MG EC tablet Take 1 tablet (81 mg total) by mouth daily.   atorvastatin (LIPITOR) 80 MG tablet Take 1 tablet (80 mg total) by mouth daily.   blood glucose meter kit and supplies Dispense based on patient and insurance preference. Use up to four times daily as directed. (FOR ICD-10 E10.9, E11.9).   Cholecalciferol (VITAMIN D) 125 MCG (5000 UT) CAPS Take 1 capsule by mouth daily.   clopidogrel (PLAVIX) 75 MG tablet Take 1 tablet (75 mg total) by mouth daily.   Continuous Blood Gluc Sensor (FREESTYLE LIBRE 2 SENSOR) MISC 1 each by Does not apply route every 14 (fourteen) days.   digoxin (LANOXIN) 0.125 MG tablet Take 1 tablet (0.125 mg total) by mouth daily.   doxycycline (VIBRA-TABS) 100 MG tablet Take 1 tablet (100 mg total) by mouth 2 (two) times daily. With food   empagliflozin (JARDIANCE) 10 MG TABS tablet Take 1 tablet (10 mg total) by mouth daily before breakfast.   Insulin Pen Needle 32G X 4 MM MISC Use as directed to administer insulin   midodrine (PROAMATINE) 5 MG tablet Take 1.5 tablets (7.5 mg total) by mouth 3  (three) times daily with meals.   mupirocin ointment (BACTROBAN) 2 % Apply 1 Application topically 2 (two) times daily.   nystatin-triamcinolone ointment (MYCOLOG) Apply 1 application topically 2 (two) times daily.   Semaglutide,0.25 or 0.5MG/DOS, 2 MG/3ML SOPN Inject 0.5 mg into the skin once a week.   sildenafil (VIAGRA) 100 MG tablet Take 1 tablet (100 mg total) by mouth daily as needed for erectile dysfunction.   triamcinolone cream (KENALOG) 0.1 % Apply  1 application topically 2 (two) times daily.   Allergies  Allergen Reactions   Wasp Venom Anaphylaxis   Wasp Venom Protein Anaphylaxis   Coffee Flavor Nausea And Vomiting    Patient states coffee gives him nausea and stomach cramps   Onion Nausea And Vomiting   Recent Results (from the past 2160 hour(s))  Pulmonary Function Test [facility] Only     Status: None   Collection Time: 09/13/21 10:44 AM  Result Value Ref Range   FVC-Pre 3.62 L   FVC-%Pred-Pre 91 %   FVC-Post 3.52 L   FVC-%Pred-Post 88 %   FVC-%Change-Post -2 %   FEV1-Pre 2.71 L   FEV1-%Pred-Pre 89 %   FEV1-Post 2.38 L   FEV1-%Pred-Post 78 %   FEV1-%Change-Post -12 %   FEV6-Pre 3.62 L   FEV6-%Pred-Pre 95 %   FEV6-Post 3.50 L   FEV6-%Pred-Post 92 %   FEV6-%Change-Post -3 %   Pre FEV1/FVC ratio 75 %   FEV1FVC-%Pred-Pre 97 %   Post FEV1/FVC ratio 68 %   FEV1FVC-%Change-Post -9 %   Pre FEV6/FVC Ratio 100 %   FEV6FVC-%Pred-Pre 104 %   Post FEV6/FVC ratio 99 %   FEV6FVC-%Pred-Post 104 %   FEV6FVC-%Change-Post 0 %   FEF 25-75 Pre 2.45 L/sec   FEF2575-%Pred-Pre 90 %   FEF 25-75 Post 1.65 L/sec   FEF2575-%Pred-Post 61 %   FEF2575-%Change-Post -32 %   RV 1.39 L   RV % pred 76 %   TLC 5.11 L   TLC % pred 88 %   DLCO unc 17.19 ml/min/mmHg   DLCO unc % pred 73 %   DL/VA 3.76 ml/min/mmHg/L   DL/VA % pred 84 %  Vitamin B12     Status: None   Collection Time: 11/05/21 12:47 PM  Result Value Ref Range   Vitamin B-12 226 180 - 914 pg/mL    Comment: (NOTE) This assay  is not validated for testing neonatal or myeloproliferative syndrome specimens for Vitamin B12 levels. Performed at [facility], [place] [address] [place], [place] [address]   Ferritin     Status: Abnormal   Collection Time: 11/05/21 12:47 PM  Result Value Ref Range   Ferritin 5 (L) 24 - 336 ng/mL    Comment: Performed at [facility], [place], [place] [address]  Iron and TIBC     Status: Abnormal   Collection Time: 11/05/21 12:47 PM  Result Value Ref Range   Iron 31 (L) 45 - 182 ug/dL   TIBC 504 (H) 250 - 450 ug/dL   Saturation Ratios 6 (L) 17.9 - 39.5 %   UIBC 473 ug/dL    Comment: Performed at [facility], [address] [place], [place] [address]  Basic metabolic panel     Status: Abnormal   Collection Time: 11/05/21 12:47 PM  Result Value Ref Range   Sodium 134 (L) 135 - 145 mmol/L   Potassium 4.0 3.5 - 5.1 mmol/L   Chloride 102 98 - 111 mmol/L   CO2 24 22 - 32 mmol/L   Glucose, Bld 328 (H) 70 - 99 mg/dL    Comment: Glucose reference range applies only to samples taken after fasting for at least 8 hours.   BUN 21 (H) 6 - 20 mg/dL   Creatinine, Ser 0.99 0.61 - 1.24 mg/dL   Calcium 8.9 8.9 - 10.3 mg/dL   GFR, Estimated >60 >60 mL/min    Comment: (NOTE) Calculated using the CKD-EPI Creatinine Equation (2021)  Anion gap 8 5 - 15    Comment: Performed at [facility], [address] [place], [place] [address]  CBC with Differential/Platelet     Status: Abnormal   Collection Time: 11/05/21 12:47 PM  Result Value Ref Range   WBC 7.4 4.0 - 10.5 K/uL   RBC 4.81 4.22 - 5.81 MIL/uL   Hemoglobin 12.5 (L) 13.0 - 17.0 g/dL   HCT 39.8 39.0 - 52.0 %   MCV 82.7 80.0 - 100.0 fL   MCH 26.0 26.0 - 34.0 pg   MCHC 31.4 30.0 - 36.0 g/dL   RDW 14.6 11.5 - 15.5 %   Platelets 531 (H) 150 - 400 K/uL   nRBC 0.0 0.0 - 0.2 %   Neutrophils Relative % 57 %   Neutro Abs 4.2 1.7 - 7.7 K/uL   Lymphocytes Relative 33 %   Lymphs Abs 2.5 0.7 -  4.0 K/uL   Monocytes Relative 8 %   Monocytes Absolute 0.6 0.1 - 1.0 K/uL   Eosinophils Relative 1 %   Eosinophils Absolute 0.1 0.0 - 0.5 K/uL   Basophils Relative 1 %   Basophils Absolute 0.1 0.0 - 0.1 K/uL   Immature Granulocytes 0 %   Abs Immature Granulocytes 0.03 0.00 - 0.07 K/uL    Comment: Performed at [facility], [place], [place] [address]  POCT HgB A1C     Status: Abnormal   Collection Time: 11/13/21 11:08 AM  Result Value Ref Range   Hemoglobin A1C 7.8 (A) 4.0 - 5.6 %   HbA1c POC (<> result, manual entry)     HbA1c, POC (prediabetic range)     HbA1c, POC (controlled diabetic range)    Urine Microalbumin w/creat. ratio     Status: None   Collection Time: 11/13/21 11:31 AM  Result Value Ref Range   Microalb, Ur <0.7 0.0 - 1.9 mg/dL   Creatinine,U 55.0 mg/dL   Microalb Creat Ratio 1.3 0.0 - 30.0 mg/g  Lipid Profile     Status: None   Collection Time: 11/13/21 11:31 AM  Result Value Ref Range   Cholesterol 147 0 - 200 mg/dL    Comment: ATP III Classification       Desirable:  < 200 mg/dL               Borderline High:  200 - 239 mg/dL          High:  > = 240 mg/dL   Triglycerides 123.0 0.0 - 149.0 mg/dL    Comment: Normal:  <150 mg/dLBorderline High:  150 - 199 mg/dL   HDL 43.50 >39.00 mg/dL   VLDL 24.6 0.0 - 40.0 mg/dL   LDL Cholesterol 79 0 - 99 mg/dL   Total CHOL/HDL Ratio 3     Comment:                Men          Women1/2 Average Risk     3.4          3.3Average Risk          5.0          4.42X Average Risk          9.6          7.13X Average Risk          15.0          11.0  NonHDL 103.41     Comment: NOTE:  Non-HDL goal should be 30 mg/dL higher than patient's LDL goal (i.e. LDL goal of < 70 mg/dL, would have non-HDL goal of < 100 mg/dL)  Direct LDL     Status: None   Collection Time: 11/13/21 11:31 AM  Result Value Ref Range   Direct LDL 89.0 mg/dL    Comment: Optimal:  <100 mg/dLNear or Above Optimal:  100-129  mg/dLBorderline High:  130-159 mg/dLHigh:  160-189 mg/dLVery High:  >190 mg/dL   Objective  Body mass index is 30.52 kg/m. Wt Readings from Last 3 Encounters:  12/04/21 177 lb 12.8 oz (80.6 kg)  11/13/21 179 lb 6.4 oz (81.4 kg)  11/05/21 177 lb 8 oz (80.5 kg)   Temp Readings from Last 3 Encounters:  12/04/21 (!) 97.4 F (36.3 C) (Oral)  11/13/21 97.6 F (36.4 C) (Oral)  11/05/21 (!) 96.4 F (35.8 C) (Tympanic)   BP Readings from Last 3 Encounters:  12/04/21 124/78  11/13/21 118/80  11/05/21 116/76   Pulse Readings from Last 3 Encounters:  12/04/21 98  11/13/21 97  11/05/21 (!) 103    Physical Exam Vitals and nursing note reviewed.  Constitutional:      Appearance: Normal appearance. He is well-developed and well-groomed.  HENT:     Head: Normocephalic and atraumatic.  Eyes:     Conjunctiva/sclera: Conjunctivae normal.     Pupils: Pupils are equal, round, and reactive to light.  Cardiovascular:     Rate and Rhythm: Normal rate and regular rhythm.     Heart sounds: Normal heart sounds.  Pulmonary:     Effort: Pulmonary effort is normal. No respiratory distress.     Breath sounds: Normal breath sounds.  Abdominal:     Tenderness: There is no abdominal tenderness.  Skin:    General: Skin is warm and moist.       Neurological:     General: No focal deficit present.     Mental Status: He is alert and oriented to person, place, and time. Mental status is at baseline.     Sensory: Sensation is intact.     Motor: Motor function is intact.     Coordination: Coordination is intact.     Gait: Gait is intact. Gait normal.  Psychiatric:        Attention and Perception: Attention and perception normal.        Mood and Affect: Mood and affect normal.        Speech: Speech normal.        Behavior: Behavior normal. Behavior is cooperative.        Thought Content: Thought content normal.        Cognition and Memory: Cognition and memory normal.        Judgment: Judgment  normal.     Assessment  Plan  Laceration of left great toe without foreign body present, nail damage status unspecified, initial encounter - Plan: mupirocin ointment (BACTROBAN) 2 %, Ambulatory referral to Podiatry, doxycycline (VIBRA-TABS) 100 MG tablet  Injury of toenail of left foot, initial encounter - Plan: Ambulatory referral to Podiatry  Laceration of left thumb without foreign body with damage to nail, initial encounter - Plan: mupirocin ointment (BACTROBAN) 2 %, DG Finger Thumb Left, doxycycline (VIBRA-TABS) 100 MG tablet   Fu with PCP in 2-3 weeks if not better rec pt call [facility] walk in hours   Provider: Dr. Olivia Mackie McLean-Scocuzza-Internal Medicine

## 2021-12-06 ENCOUNTER — Ambulatory Visit: Payer: Medicare Other | Admitting: Podiatry

## 2021-12-06 ENCOUNTER — Inpatient Hospital Stay: Payer: Medicare Other | Attending: Internal Medicine

## 2021-12-06 DIAGNOSIS — E538 Deficiency of other specified B group vitamins: Secondary | ICD-10-CM | POA: Insufficient documentation

## 2021-12-06 DIAGNOSIS — D500 Iron deficiency anemia secondary to blood loss (chronic): Secondary | ICD-10-CM

## 2021-12-06 DIAGNOSIS — Z79899 Other long term (current) drug therapy: Secondary | ICD-10-CM | POA: Diagnosis not present

## 2021-12-06 MED ORDER — CYANOCOBALAMIN 1000 MCG/ML IJ SOLN
1000.0000 ug | Freq: Once | INTRAMUSCULAR | Status: AC
  Administered 2021-12-06: 1000 ug via INTRAMUSCULAR
  Filled 2021-12-06: qty 1

## 2021-12-13 ENCOUNTER — Ambulatory Visit (INDEPENDENT_AMBULATORY_CARE_PROVIDER_SITE_OTHER): Payer: Medicare Other | Admitting: Podiatry

## 2021-12-13 DIAGNOSIS — S90212A Contusion of left great toe with damage to nail, initial encounter: Secondary | ICD-10-CM | POA: Diagnosis not present

## 2021-12-13 NOTE — Progress Notes (Signed)
Subjective: Benjamin Conley is a 56 y.o.  male returns to office today for follow up evaluation after having left 1st self total nail avulsion performed. Patient has been soaking using epsom salt and applying topical antibiotic covered with bandaid daily. Patient denies fevers, chills, nausea, vomiting. Denies any calf pain, chest pain, SOB.   Objective:  Vitals: Reviewed  General: Well developed, nourished, in no acute distress, alert and oriented x3   Dermatology: Skin is warm, dry and supple bilateral. left 1st nail bed appears to be clean, dry, with mild granular tissue and surrounding scab. There is no surrounding erythema, edema, drainage/purulence. The remaining nails appear unremarkable at this time. There are no other lesions or other signs of infection present.  Neurovascular status: Intact. No lower extremity swelling; No pain with calf compression bilateral.  Musculoskeletal: Decreased tenderness to palpation of the left 1st nail bed. Muscular strength within normal limits bilateral.   Assesement and Plan:  self total nail avulsion, doing well.   -Continue soaking in epsom salts twice a day followed by antibiotic ointment and a band-aid. Can leave uncovered at night. Continue this until completely healed.  -If the area has not healed in 2 weeks, call the office for follow-up appointment, or sooner if any problems arise.  -Monitor for any signs/symptoms of infection. Call the office immediately if any occur or go directly to the emergency room. Call with any questions/concerns.  Boneta Lucks, DPM

## 2021-12-19 ENCOUNTER — Encounter: Payer: Self-pay | Admitting: Family

## 2021-12-27 ENCOUNTER — Other Ambulatory Visit (INDEPENDENT_AMBULATORY_CARE_PROVIDER_SITE_OTHER): Payer: Medicare Other

## 2021-12-27 DIAGNOSIS — E785 Hyperlipidemia, unspecified: Secondary | ICD-10-CM | POA: Diagnosis not present

## 2021-12-27 DIAGNOSIS — M65332 Trigger finger, left middle finger: Secondary | ICD-10-CM | POA: Diagnosis not present

## 2021-12-27 LAB — COMPREHENSIVE METABOLIC PANEL
ALT: 24 U/L (ref 0–53)
AST: 19 U/L (ref 0–37)
Albumin: 4.3 g/dL (ref 3.5–5.2)
Alkaline Phosphatase: 84 U/L (ref 39–117)
BUN: 12 mg/dL (ref 6–23)
CO2: 27 mEq/L (ref 19–32)
Calcium: 9.1 mg/dL (ref 8.4–10.5)
Chloride: 102 mEq/L (ref 96–112)
Creatinine, Ser: 0.71 mg/dL (ref 0.40–1.50)
GFR: 102.8 mL/min (ref >60.00)
Glucose, Bld: 214 mg/dL — ABNORMAL HIGH (ref 70–99)
Potassium: 4.3 mEq/L (ref 3.5–5.1)
Sodium: 135 mEq/L (ref 135–145)
Total Bilirubin: 0.3 mg/dL (ref 0.2–1.2)
Total Protein: 7.1 g/dL (ref 6.0–8.3)

## 2022-01-04 ENCOUNTER — Inpatient Hospital Stay: Payer: Medicare Other | Attending: Internal Medicine

## 2022-01-04 DIAGNOSIS — Z79899 Other long term (current) drug therapy: Secondary | ICD-10-CM | POA: Insufficient documentation

## 2022-01-04 DIAGNOSIS — E538 Deficiency of other specified B group vitamins: Secondary | ICD-10-CM | POA: Diagnosis not present

## 2022-01-04 DIAGNOSIS — D500 Iron deficiency anemia secondary to blood loss (chronic): Secondary | ICD-10-CM

## 2022-01-04 MED ORDER — CYANOCOBALAMIN 1000 MCG/ML IJ SOLN
1000.0000 ug | Freq: Once | INTRAMUSCULAR | Status: AC
  Administered 2022-01-04: 1000 ug via INTRAMUSCULAR

## 2022-01-07 ENCOUNTER — Encounter (INDEPENDENT_AMBULATORY_CARE_PROVIDER_SITE_OTHER): Payer: Self-pay

## 2022-01-22 DIAGNOSIS — M65332 Trigger finger, left middle finger: Secondary | ICD-10-CM | POA: Diagnosis not present

## 2022-01-29 ENCOUNTER — Other Ambulatory Visit: Payer: Medicare Other

## 2022-01-29 DIAGNOSIS — R972 Elevated prostate specific antigen [PSA]: Secondary | ICD-10-CM

## 2022-01-30 LAB — PSA: Prostate Specific Ag, Serum: 3.8 ng/mL (ref 0.0–4.0)

## 2022-02-03 ENCOUNTER — Other Ambulatory Visit: Payer: Self-pay | Admitting: Family

## 2022-02-03 DIAGNOSIS — E119 Type 2 diabetes mellitus without complications: Secondary | ICD-10-CM

## 2022-02-04 ENCOUNTER — Inpatient Hospital Stay: Payer: Medicare Other | Attending: Internal Medicine

## 2022-02-04 DIAGNOSIS — E538 Deficiency of other specified B group vitamins: Secondary | ICD-10-CM | POA: Diagnosis not present

## 2022-02-04 DIAGNOSIS — Z79899 Other long term (current) drug therapy: Secondary | ICD-10-CM | POA: Insufficient documentation

## 2022-02-04 DIAGNOSIS — D500 Iron deficiency anemia secondary to blood loss (chronic): Secondary | ICD-10-CM

## 2022-02-04 MED ORDER — CYANOCOBALAMIN 1000 MCG/ML IJ SOLN
1000.0000 ug | Freq: Once | INTRAMUSCULAR | Status: AC
  Administered 2022-02-04: 1000 ug via INTRAMUSCULAR
  Filled 2022-02-04: qty 1

## 2022-02-04 NOTE — Progress Notes (Unsigned)
02/05/2022 1:05 PM   Benjamin Conley April 25, 1965 [id number]  Referring provider: Burnard Hawthorne, [name] [address] [place],  [place] [address]  No chief complaint on file.  Urological history: 1. ED -contributing factors of age, BPH, COPD, DM, HTN, HLD and smoking -sildenafil 100 mg, on-demand-dosing  2. BPH with LU TS -cysto 01/2020 prominent lateral lobe enlargement of prostate and moderate bladder neck elevation -prostate volume 47 grams -I PSS *** -tamsulosin 0.4 mg daily   3. Ejaculatory disorder -retrograde ejaculation secondary to BPH and tamsulosin   4. Elevated PSA  -PSA (01/2022) 3.8 -prostate biopsy (07/2021)   HPI: Benjamin Conley is a 56 y.o. male who presents today for elevated PSA follow up.   PSA (01/2022) 3.8      Score:  1-7 Mild 8-19 Moderate 20-35 Severe  PMH: Past Medical History:  Diagnosis Date   Acute posthemorrhagic anemia    Complication of anesthesia    c/o difficulty breathing after anesthesia   COPD (chronic obstructive pulmonary disease) (HCC)    Diabetes mellitus without complication ([place])    Hyperlipemia    Melena 08/21/2019   Paroxysmal supraventricular tachycardia (HCC) 08/19/2019   Postural dizziness with presyncope 08/21/2019   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   Tobacco abuse    Upper GI bleed 08/21/2019    Surgical History: Past Surgical History:  Procedure Laterality Date   BACK SURGERY     lumbar   COLONOSCOPY WITH PROPOFOL N/A 08/24/2019   Procedure: COLONOSCOPY WITH PROPOFOL;  Surgeon: [name], MD;  Location: [facility] ENDOSCOPY;  Service: Endoscopy;  Laterality: N/A;   ESOPHAGOGASTRODUODENOSCOPY (EGD) WITH PROPOFOL N/A 08/24/2019   Procedure: ESOPHAGOGASTRODUODENOSCOPY (EGD) WITH PROPOFOL;  Surgeon: [name], MD;  Location: [facility] ENDOSCOPY;  Service: Endoscopy;  Laterality: N/A;   STENT PLACEMENT VASCULAR ([place] HX)  07/2019   stenosis of distal splenic artery and stent placed   VISCERAL  ANGIOGRAPHY N/A 08/06/2019   Procedure: VISCERAL ANGIOGRAPHY;  Surgeon: [name], MD;  Location: [facility] CV LAB;  Service: Cardiovascular;  Laterality: N/A;    Home Medications:  Allergies as of 02/05/2022       Reactions   Wasp Venom Anaphylaxis   Wasp Venom Protein Anaphylaxis   Coffee Flavor Nausea And Vomiting   Patient states coffee gives him nausea and stomach cramps   Onion Nausea And Vomiting        Medication List        Accurate as of February 04, 2022  1:05 PM. If you have any questions, ask your nurse or doctor.          albuterol 108 (90 Base) MCG/ACT inhaler Commonly known as: VENTOLIN HFA Inhale 2 puffs into the lungs every 6 (six) hours as needed for wheezing or shortness of breath.   aspirin EC 81 MG tablet Take 1 tablet (81 mg total) by mouth daily.   atorvastatin 80 MG tablet Commonly known as: LIPITOR Take 1 tablet (80 mg total) by mouth daily.   blood glucose meter kit and supplies Dispense based on patient and insurance preference. Use up to four times daily as directed. (FOR ICD-10 E10.9, E11.9).   clopidogrel 75 MG tablet Commonly known as: PLAVIX Take 1 tablet (75 mg total) by mouth daily.   digoxin 0.125 MG tablet Commonly known as: Lanoxin Take 1 tablet (0.125 mg total) by mouth daily.   doxycycline 100 MG tablet Commonly known as: VIBRA-TABS Take 1 tablet (100 mg total) by mouth 2 (two) times  daily. With food   famotidine 20 MG tablet Commonly known as: PEPCID Take 1 tablet (20 mg total) by mouth 2 (two) times daily.   fluticasone 50 MCG/ACT nasal spray Commonly known as: FLONASE Place 2 sprays into both nostrils daily.   FreeStyle Libre 2 Sensor Misc 1 each by Does not apply route every 14 (fourteen) days.   Insulin Pen Needle 32G X 4 MM Misc Use as directed to administer insulin   Jardiance 10 MG Tabs tablet Generic drug: empagliflozin TAKE 1 TABLET BY MOUTH ONCE DAILY BEFORE BREAKFAST   midodrine 5 MG  tablet Commonly known as: PROAMATINE Take 1.5 tablets (7.5 mg total) by mouth 3 (three) times daily with meals.   mupirocin ointment 2 % Commonly known as: BACTROBAN Apply 1 Application topically 2 (two) times daily.   nystatin-triamcinolone ointment Commonly known as: MYCOLOG Apply 1 application topically 2 (two) times daily.   Semaglutide(0.25 or 0.5MG/DOS) 2 MG/3ML Sopn Inject 0.5 mg into the skin once a week.   sildenafil 100 MG tablet Commonly known as: VIAGRA Take 1 tablet (100 mg total) by mouth daily as needed for erectile dysfunction.   triamcinolone cream 0.1 % Commonly known as: KENALOG Apply 1 application topically 2 (two) times daily.   Vitamin D 125 MCG (5000 UT) Caps Take 1 capsule by mouth daily.        Allergies:  Allergies  Allergen Reactions   Wasp Venom Anaphylaxis   Wasp Venom Protein Anaphylaxis   Coffee Flavor Nausea And Vomiting    Patient states coffee gives him nausea and stomach cramps   Onion Nausea And Vomiting    Family History: Family History  Problem Relation Age of Onset   Diabetes Mother    Diabetes Mellitus II Mother    Tuberculosis Father    Heart disease Father    Diabetes Sister    Diabetes Sister    Diabetes Sister    Diabetes Sister    Diabetes Brother    Diabetes Mellitus II Brother    Hyperlipidemia Brother    Cancer Maternal Aunt    Colon cancer Maternal Uncle    Prostate cancer Neg Hx     Social History:  reports that he quit smoking about 1 years ago. His smoking use included cigarettes. He has a 96.00 pack-year smoking history. He has never used smokeless tobacco. He reports that he does not drink alcohol and does not use drugs.   Physical Exam: There were no vitals taken for this visit.  Constitutional:  Well nourished. Alert and oriented, No acute distress. HEENT: [place] AT, mask in place.  Trachea midline Cardiovascular: No clubbing, cyanosis, or edema. Respiratory: Normal respiratory effort, no increased  work of breathing. Neurologic: Grossly intact, no focal deficits, moving all 4 extremities. Psychiatric: Normal mood and affect.   Laboratory data: Serum creatinine (12/2021) 0.71 Recent Results (from the past 2160 hour(s))  POCT HgB A1C     Status: Abnormal   Collection Time: 11/13/21 11:08 AM  Result Value Ref Range   Hemoglobin A1C 7.8 (A) 4.0 - 5.6 %   HbA1c POC (<> result, manual entry)     HbA1c, POC (prediabetic range)     HbA1c, POC (controlled diabetic range)    Urine Microalbumin w/creat. ratio     Status: None   Collection Time: 11/13/21 11:31 AM  Result Value Ref Range   Microalb, Ur <0.7 0.0 - 1.9 mg/dL   Creatinine,U 55.0 mg/dL   Microalb Creat Ratio 1.3 0.0 - 30.0  mg/g  Lipid Profile     Status: None   Collection Time: 11/13/21 11:31 AM  Result Value Ref Range   Cholesterol 147 0 - 200 mg/dL    Comment: ATP III Classification       Desirable:  < 200 mg/dL               Borderline High:  200 - 239 mg/dL          High:  > = 240 mg/dL   Triglycerides 123.0 0.0 - 149.0 mg/dL    Comment: Normal:  <150 mg/dLBorderline High:  150 - 199 mg/dL   HDL 43.50 >39.00 mg/dL   VLDL 24.6 0.0 - 40.0 mg/dL   LDL Cholesterol 79 0 - 99 mg/dL   Total CHOL/HDL Ratio 3     Comment:                Men          Women1/2 Average Risk     3.4          3.3Average Risk          5.0          4.42X Average Risk          9.6          7.13X Average Risk          15.0          11.0                       NonHDL 103.41     Comment: NOTE:  Non-HDL goal should be 30 mg/dL higher than patient's LDL goal (i.e. LDL goal of < 70 mg/dL, would have non-HDL goal of < 100 mg/dL)  Direct LDL     Status: None   Collection Time: 11/13/21 11:31 AM  Result Value Ref Range   Direct LDL 89.0 mg/dL    Comment: Optimal:  <100 mg/dLNear or Above Optimal:  100-129 mg/dLBorderline High:  130-159 mg/dLHigh:  160-189 mg/dLVery High:  >190 mg/dL  Comprehensive metabolic panel     Status: Abnormal   Collection Time:  12/27/21 10:41 AM  Result Value Ref Range   Sodium 135 135 - 145 mEq/L   Potassium 4.3 3.5 - 5.1 mEq/L   Chloride 102 96 - 112 mEq/L   CO2 27 19 - 32 mEq/L   Glucose, Bld 214 (H) 70 - 99 mg/dL   BUN 12 6 - 23 mg/dL   Creatinine, Ser 0.71 0.40 - 1.50 mg/dL   Total Bilirubin 0.3 0.2 - 1.2 mg/dL   Alkaline Phosphatase 84 39 - 117 U/L   AST 19 0 - 37 U/L   ALT 24 0 - 53 U/L   Total Protein 7.1 6.0 - 8.3 g/dL   Albumin 4.3 3.5 - 5.2 g/dL   GFR 102.80 >60.00 mL/min    Comment: Calculated using the CKD-EPI Creatinine Equation (2021)   Calcium 9.1 8.4 - 10.5 mg/dL  PSA     Status: None   Collection Time: 01/29/22 10:38 AM  Result Value Ref Range   Prostate Specific Ag, Serum 3.8 0.0 - 4.0 ng/mL    Comment: Roche ECLIA methodology. According to the American Urological Association, Serum PSA should decrease and remain at undetectable levels after radical prostatectomy. The AUA defines biochemical recurrence as an initial PSA value 0.2 ng/mL or greater followed by a subsequent confirmatory PSA value 0.2 ng/mL or greater. Values obtained with different  assay methods or kits cannot be used interchangeably. Results cannot be interpreted as absolute evidence of the presence or absence of malignant disease.   I have reviewed the labs.  Assessment & Plan:    1. BPH with LU TS -PSA stable -symptoms are stable -continue tamsulosin 0.4 mg daily   Zara Council, PA-C  [facility] [address], [place], [place] [address] [phone]

## 2022-02-05 ENCOUNTER — Ambulatory Visit (INDEPENDENT_AMBULATORY_CARE_PROVIDER_SITE_OTHER): Payer: Medicare Other | Admitting: Urology

## 2022-02-05 ENCOUNTER — Other Ambulatory Visit: Payer: Self-pay

## 2022-02-05 ENCOUNTER — Encounter: Payer: Self-pay | Admitting: Urology

## 2022-02-05 VITALS — BP 135/84 | HR 101 | Ht 65.0 in | Wt 176.0 lb

## 2022-02-05 DIAGNOSIS — Z125 Encounter for screening for malignant neoplasm of prostate: Secondary | ICD-10-CM

## 2022-02-05 DIAGNOSIS — N401 Enlarged prostate with lower urinary tract symptoms: Secondary | ICD-10-CM

## 2022-02-05 DIAGNOSIS — N3943 Post-void dribbling: Secondary | ICD-10-CM | POA: Diagnosis not present

## 2022-02-05 DIAGNOSIS — E119 Type 2 diabetes mellitus without complications: Secondary | ICD-10-CM

## 2022-02-05 MED ORDER — EMPAGLIFLOZIN 10 MG PO TABS: 10.0000 mg | ORAL_TABLET | Freq: Every day | ORAL | 1 refills | Status: DC

## 2022-02-13 ENCOUNTER — Ambulatory Visit (INDEPENDENT_AMBULATORY_CARE_PROVIDER_SITE_OTHER): Payer: Medicare Other | Admitting: Family

## 2022-02-13 ENCOUNTER — Encounter: Payer: Self-pay | Admitting: Family

## 2022-02-13 ENCOUNTER — Telehealth: Payer: Self-pay

## 2022-02-13 VITALS — BP 130/78 | HR 100 | Temp 97.6°F | Wt 176.6 lb

## 2022-02-13 DIAGNOSIS — H53143 Visual discomfort, bilateral: Secondary | ICD-10-CM

## 2022-02-13 DIAGNOSIS — E119 Type 2 diabetes mellitus without complications: Secondary | ICD-10-CM

## 2022-02-13 LAB — POCT GLYCOSYLATED HEMOGLOBIN (HGB A1C): Hemoglobin A1C: 9 % — AB (ref 4.0–5.6)

## 2022-02-13 MED ORDER — SEMAGLUTIDE (2 MG/DOSE) 8 MG/3ML ~~LOC~~ SOPN: 2.0000 mg | PEN_INJECTOR | SUBCUTANEOUS | 2 refills | Status: DC

## 2022-02-13 MED ORDER — EMPAGLIFLOZIN 25 MG PO TABS: 25.0000 mg | ORAL_TABLET | Freq: Every day | ORAL | 1 refills | Status: DC

## 2022-02-13 NOTE — Progress Notes (Signed)
Discussed during OV. Please see OV notes

## 2022-02-13 NOTE — Progress Notes (Unsigned)
Subjective:    Patient ID: Benjamin Conley, male    DOB: 18-May-1965, 56 y.o.   MRN: [id number]  CC: Benjamin Conley is a 56 y.o. male who presents today for follow up.   HPI: Complains of dry eyes x 4 days Endorses eyes are itching Eye drops burn. His eyes are very sensitive to light.  No vision changes, vision loss, HA, congestion  Not taking antihistamine  Mouth is not dry  Doesn't feel he has foreign body in the eye  He has ophthalmology appointment next week.     Compliant with midodrine 7.5 mg BID.   HLD-compliant with atorvastatin 80 mg  DM-compliant with Ozempic 2 mg, Jardiance 10 mg daily.   Endorses drinking soda, eating candy.   Continues to follow with hematology for iron deficient anemia, thrombocytosis, B12 deficiency.  Iron infusion 02/04/2022  HISTORY:  Past Medical History:  Diagnosis Date   Acute posthemorrhagic anemia    Complication of anesthesia    c/o difficulty breathing after anesthesia   COPD (chronic obstructive pulmonary disease) (HCC)    Diabetes mellitus without complication ([place])    Hyperlipemia    Melena 08/21/2019   Paroxysmal supraventricular tachycardia 08/19/2019   Postural dizziness with presyncope 08/21/2019   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   Tobacco abuse    Upper GI bleed 08/21/2019   Past Surgical History:  Procedure Laterality Date   BACK SURGERY     lumbar   COLONOSCOPY WITH PROPOFOL N/A 08/24/2019   Procedure: COLONOSCOPY WITH PROPOFOL;  Surgeon: [name], MD;  Location: [facility] ENDOSCOPY;  Service: Endoscopy;  Laterality: N/A;   ESOPHAGOGASTRODUODENOSCOPY (EGD) WITH PROPOFOL N/A 08/24/2019   Procedure: ESOPHAGOGASTRODUODENOSCOPY (EGD) WITH PROPOFOL;  Surgeon: [name], MD;  Location: [facility] ENDOSCOPY;  Service: Endoscopy;  Laterality: N/A;   STENT PLACEMENT VASCULAR ([place] HX)  07/2019   stenosis of distal splenic artery and stent placed   VISCERAL ANGIOGRAPHY N/A 08/06/2019   Procedure: VISCERAL ANGIOGRAPHY;   Surgeon: [name], MD;  Location: [facility] CV LAB;  Service: Cardiovascular;  Laterality: N/A;   Family History  Problem Relation Age of Onset   Diabetes Mother    Diabetes Mellitus II Mother    Tuberculosis Father    Heart disease Father    Diabetes Sister    Diabetes Sister    Diabetes Sister    Diabetes Sister    Diabetes Brother    Diabetes Mellitus II Brother    Hyperlipidemia Brother    Cancer Maternal Aunt    Colon cancer Maternal Uncle    Prostate cancer Neg Hx     Allergies: Wasp venom, Wasp venom protein, Coffee flavor, and Onion Current Outpatient Medications on File Prior to Visit  Medication Sig Dispense Refill   aspirin 81 MG EC tablet Take 1 tablet (81 mg total) by mouth daily. 90 tablet 3   atorvastatin (LIPITOR) 80 MG tablet Take 1 tablet (80 mg total) by mouth daily. 90 tablet 3   blood glucose meter kit and supplies Dispense based on patient and insurance preference. Use up to four times daily as directed. (FOR ICD-10 E10.9, E11.9). 1 each 0   Cholecalciferol (VITAMIN D) 125 MCG (5000 UT) CAPS Take 1 capsule by mouth daily.     clopidogrel (PLAVIX) 75 MG tablet Take 1 tablet (75 mg total) by mouth daily. 90 tablet 3   Continuous Blood Gluc Sensor (FREESTYLE LIBRE 2 SENSOR) MISC 1 each by Does not apply route every 14 (fourteen) days. 2 each  4   digoxin (LANOXIN) 0.125 MG tablet Take 1 tablet (0.125 mg total) by mouth daily. 30 tablet 3   doxycycline (VIBRA-TABS) 100 MG tablet Take 1 tablet (100 mg total) by mouth 2 (two) times daily. With food 20 tablet 0   empagliflozin (JARDIANCE) 10 MG TABS tablet Take 1 tablet (10 mg total) by mouth daily before breakfast. 30 tablet 1   Insulin Pen Needle 32G X 4 MM MISC Use as directed to administer insulin 100 each 2   midodrine (PROAMATINE) 5 MG tablet Take 1.5 tablets (7.5 mg total) by mouth 3 (three) times daily with meals. 180 tablet 1   mupirocin ointment (BACTROBAN) 2 % Apply 1 Application topically 2  (two) times daily. 30 g 0   nystatin-triamcinolone ointment (MYCOLOG) Apply 1 application topically 2 (two) times daily. 60 g 0   Semaglutide,0.25 or 0.5MG/DOS, 2 MG/3ML SOPN Inject 0.5 mg into the skin once a week. 3 mL 2   sildenafil (VIAGRA) 100 MG tablet Take 1 tablet (100 mg total) by mouth daily as needed for erectile dysfunction. 30 tablet 0   triamcinolone cream (KENALOG) 0.1 % Apply 1 application topically 2 (two) times daily. 30 g 0   albuterol (VENTOLIN HFA) 108 (90 Base) MCG/ACT inhaler Inhale 2 puffs into the lungs every 6 (six) hours as needed for wheezing or shortness of breath. (Patient not taking: Reported on 12/04/2021) 6.7 g 3   famotidine (PEPCID) 20 MG tablet Take 1 tablet (20 mg total) by mouth 2 (two) times daily. (Patient not taking: Reported on 12/04/2021)     fluticasone (FLONASE) 50 MCG/ACT nasal spray Place 2 sprays into both nostrils daily. (Patient not taking: Reported on 12/04/2021) 16 g 6   No current facility-administered medications on file prior to visit.    Social History   Tobacco Use   Smoking status: Former    Packs/day: 2.00    Years: 48.00    Total pack years: 96.00    Types: Cigarettes    Quit date: 02/12/2020    Years since quitting: 2.0   Smokeless tobacco: Never  Vaping Use   Vaping Use: Never used  Substance Use Topics   Alcohol use: No    Comment: Never been a problem   Drug use: No    Review of Systems  Constitutional:  Negative for chills and fever.  Respiratory:  Negative for cough.   Cardiovascular:  Negative for chest pain and palpitations.  Gastrointestinal:  Negative for nausea and vomiting.      Objective:    BP 130/78   Pulse 100   Temp 97.6 F (36.4 C) (Oral)   Wt 176 lb 9.6 oz (80.1 kg)   SpO2 99%   BMI 29.39 kg/m  BP Readings from Last 3 Encounters:  02/13/22 130/78  02/05/22 135/84  12/04/21 124/78   Wt Readings from Last 3 Encounters:  02/13/22 176 lb 9.6 oz (80.1 kg)  02/05/22 176 lb (79.8 kg)  12/04/21  177 lb 12.8 oz (80.6 kg)    Physical Exam Vitals reviewed.  Constitutional:      Appearance: He is well-developed.  Eyes:     General: Lids are normal.        Right eye: No foreign body, discharge or hordeolum.        Left eye: No foreign body, discharge or hordeolum.     Conjunctiva/sclera:     Right eye: Right conjunctiva is not injected.     Left eye: Left conjunctiva is not  injected.  Cardiovascular:     Rate and Rhythm: Regular rhythm.     Heart sounds: Normal heart sounds.  Pulmonary:     Effort: Pulmonary effort is normal. No respiratory distress.     Breath sounds: Normal breath sounds. No wheezing, rhonchi or rales.  Skin:    General: Skin is warm and dry.  Neurological:     Mental Status: He is alert.  Psychiatric:        Speech: Speech normal.        Behavior: Behavior normal.        Assessment & Plan:   Problem List Items Addressed This Visit       Endocrine   Diabetes mellitus without complication ([place]) - Primary   Relevant Orders   POCT HgB A1C (Completed)     I am having Arvid R. Sookram maintain his sildenafil, blood glucose meter kit and supplies, nystatin-triamcinolone ointment, clopidogrel, aspirin EC, albuterol, Insulin Pen Needle, triamcinolone cream, Vitamin D, digoxin, fluticasone, famotidine, midodrine, Semaglutide(0.25 or 0.5MG/DOS), atorvastatin, FreeStyle Libre 2 Sensor, mupirocin ointment, doxycycline, and empagliflozin.   No orders of the defined types were placed in this encounter.   Return precautions given.   Risks, benefits, and alternatives of the medications and treatment plan prescribed today were discussed, and patient expressed understanding.   Education regarding symptom management and diagnosis given to patient on AVS.  Continue to follow with Burnard Hawthorne, [name] for routine health maintenance.   Seve Rennie Natter and I agreed with plan.   [name], [name]

## 2022-02-13 NOTE — Telephone Encounter (Signed)
Patient states he would like to let [name], [name], know that the earliest appointment he can get with [facility] is Friday.

## 2022-02-13 NOTE — Patient Instructions (Addendum)
Please increase Jardiance to 25 mg daily.  Please continue Ozempic 2 mg dose.   Please abstain from drinking sugary drinks.  We have called [facility] request an urgent appointment due to the light sensitivity that you are having.  If you have any changes in your vision, vision loss, eye pain, please report to the emergency room.    I anticipate you will get a phone call from [facility] today.  Please call her office to let me know right away if you have not heard from them.

## 2022-02-15 ENCOUNTER — Encounter: Payer: Self-pay | Admitting: Family

## 2022-02-15 DIAGNOSIS — M3501 Sicca syndrome with keratoconjunctivitis: Secondary | ICD-10-CM | POA: Diagnosis not present

## 2022-02-15 NOTE — Telephone Encounter (Signed)
We got it Mr Pang. Thanks!

## 2022-02-15 NOTE — Telephone Encounter (Signed)
Spoke with pt  Light sensitivity is unchanged He saw [facility] today whom suspected eyelid irritation and dry eye.

## 2022-02-20 DIAGNOSIS — H53143 Visual discomfort, bilateral: Secondary | ICD-10-CM | POA: Insufficient documentation

## 2022-02-20 NOTE — Assessment & Plan Note (Signed)
Concern with acute onset photophobia.  Advised patient he would need urgent ophthalmology consult to rule out more worrisome diagnosis including uveitis.  We have a call out to [facility] to schedule soonest available appointment. Will follow.

## 2022-02-20 NOTE — Assessment & Plan Note (Signed)
Lab Results  Component Value Date   HGBA1C 9.0 (A) 02/13/2022   Uncontrolled.  Discussed dietary indiscretion.  Continue Ozempic 2 mg, increase Jardiance to 25 mg daily

## 2022-02-28 ENCOUNTER — Encounter: Payer: Self-pay | Admitting: Cardiology

## 2022-02-28 ENCOUNTER — Ambulatory Visit: Payer: Medicare Other | Attending: Cardiology | Admitting: Cardiology

## 2022-02-28 VITALS — BP 100/60 | HR 119 | Ht 64.0 in | Wt 176.0 lb

## 2022-02-28 DIAGNOSIS — R42 Dizziness and giddiness: Secondary | ICD-10-CM | POA: Diagnosis not present

## 2022-02-28 DIAGNOSIS — I4711 Inappropriate sinus tachycardia, so stated: Secondary | ICD-10-CM | POA: Diagnosis not present

## 2022-02-28 DIAGNOSIS — I951 Orthostatic hypotension: Secondary | ICD-10-CM | POA: Diagnosis not present

## 2022-02-28 DIAGNOSIS — E782 Mixed hyperlipidemia: Secondary | ICD-10-CM | POA: Diagnosis not present

## 2022-02-28 MED ORDER — MIDODRINE HCL 10 MG PO TABS: 10.0000 mg | ORAL_TABLET | Freq: Three times a day (TID) | ORAL | 0 refills | Status: AC

## 2022-02-28 NOTE — Progress Notes (Signed)
Cardiology Office Note:    Date:  02/28/2022   ID:  [name], DOB [birth year], MRN [id number]  PCP:  Burnard Hawthorne, [name]  [facility] Cardiologist:  [name], MD  [place] Electrophysiologist:  None   Referring MD: Burnard Hawthorne, [name]   Chief Complaint  Patient presents with   Other    6 Month f/u elevated HR. Meds reviewed verbally with pt.    History of Present Illness:    Benjamin Conley is a 56 y.o. male with a hx of diabetes, orthostatic hypotension, inappropriate sinus tach, splenic infarct s/p stent placement, COPD, hyperlipidemia, former smoker x40+ years who presents for follow-up.   Symptoms of orthostasis have improved somewhat since starting and titrating midodrine.  Currently 7.5 mg 3 times daily.  He is compliant medications as prescribed.  Denies chest pain.  Was told he may have posterior circulation issues contributing to symptoms of dizziness.  Otherwise doing okay, no new concerns.  Prior notes Echo 09/5641 normal systolic and diastolic function, EF 55 to 60%. Lexiscan Myoview 08/2019 low risk, no evidence for ischemia, no significant coronary artery calcification Cardiac monitor on May/2022, no significant arrhythmias.  Patient had worsening left upper quadrant abdominal pain.  He presented to the ED  where CT abdomen dated 07/2019 showed distal splenic aneurysm no greater than 6 mm, small dissection, likely source of splenic infarctions.  He was diagnosed with splenic infarct and had a stent placed to the splenic artery.    Denies palpitations or irregular heartbeats.  States his heart rates have been elevated over the past 2 to 3 weeks, up to 120 bpm upon checks at home.   Past Medical History:  Diagnosis Date   Acute posthemorrhagic anemia    Complication of anesthesia    c/o difficulty breathing after anesthesia   COPD (chronic obstructive pulmonary disease) (HCC)    Diabetes mellitus without complication ([place])     Hyperlipemia    Melena 08/21/2019   Paroxysmal supraventricular tachycardia 08/19/2019   Postural dizziness with presyncope 08/21/2019   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   Tobacco abuse    Upper GI bleed 08/21/2019    Past Surgical History:  Procedure Laterality Date   BACK SURGERY     lumbar   COLONOSCOPY WITH PROPOFOL N/A 08/24/2019   Procedure: COLONOSCOPY WITH PROPOFOL;  Surgeon: [name], MD;  Location: [facility] ENDOSCOPY;  Service: Endoscopy;  Laterality: N/A;   ESOPHAGOGASTRODUODENOSCOPY (EGD) WITH PROPOFOL N/A 08/24/2019   Procedure: ESOPHAGOGASTRODUODENOSCOPY (EGD) WITH PROPOFOL;  Surgeon: [name], MD;  Location: [facility] ENDOSCOPY;  Service: Endoscopy;  Laterality: N/A;   STENT PLACEMENT VASCULAR ([place] HX)  07/2019   stenosis of distal splenic artery and stent placed   VISCERAL ANGIOGRAPHY N/A 08/06/2019   Procedure: VISCERAL ANGIOGRAPHY;  Surgeon: [name], MD;  Location: [place] CV LAB;  Service: Cardiovascular;  Laterality: N/A;    Current Medications: Current Meds  Medication Sig   aspirin 81 MG EC tablet Take 1 tablet (81 mg total) by mouth daily.   atorvastatin (LIPITOR) 80 MG tablet Take 1 tablet (80 mg total) by mouth daily.   blood glucose meter kit and supplies Dispense based on patient and insurance preference. Use up to four times daily as directed. (FOR ICD-10 E10.9, E11.9).   Cholecalciferol (VITAMIN D) 125 MCG (5000 UT) CAPS Take 1 capsule by mouth daily.   clopidogrel (PLAVIX) 75 MG tablet Take 1 tablet (75 mg total) by mouth daily.   Continuous Blood Gluc  Sensor (FREESTYLE LIBRE 2 SENSOR) MISC 1 each by Does not apply route every 14 (fourteen) days.   digoxin (LANOXIN) 0.125 MG tablet Take 1 tablet (0.125 mg total) by mouth daily.   empagliflozin (JARDIANCE) 25 MG TABS tablet Take 1 tablet (25 mg total) by mouth daily before breakfast.   Insulin Pen Needle 32G X 4 MM MISC Use as directed to administer insulin   nystatin-triamcinolone ointment  (MYCOLOG) Apply 1 application topically 2 (two) times daily.   Semaglutide, 2 MG/DOSE, 8 MG/3ML SOPN Inject 2 mg as directed once a week.   sildenafil (VIAGRA) 100 MG tablet Take 1 tablet (100 mg total) by mouth daily as needed for erectile dysfunction.   triamcinolone cream (KENALOG) 0.1 % Apply 1 application topically 2 (two) times daily.   [DISCONTINUED] midodrine (PROAMATINE) 5 MG tablet Take 1.5 tablets (7.5 mg total) by mouth 3 (three) times daily with meals.     Allergies:   Wasp venom, Wasp venom protein, Coffee flavor, and Onion   Social History   Socioeconomic History   Marital status: Single    Spouse name: Not on file   Number of children: Not on file   Years of education: Not on file   Highest education level: Not on file  Occupational History   Not on file  Tobacco Use   Smoking status: Former    Packs/day: 2.00    Years: 48.00    Total pack years: 96.00    Types: Cigarettes    Quit date: 02/12/2020    Years since quitting: 2.0   Smokeless tobacco: Never  Vaping Use   Vaping Use: Never used  Substance and Sexual Activity   Alcohol use: No    Comment: Never been a problem   Drug use: No   Sexual activity: Yes  Other Topics Concern   Not on file  Social History Narrative   Single, lives alone, unemployed.   Social Determinants of Health   Financial Resource Strain: Low Risk  (10/23/2021)   Overall Financial Resource Strain (CARDIA)    Difficulty of Paying Living Expenses: Not hard at all  Food Insecurity: No Food Insecurity (10/23/2021)   Hunger Vital Sign    Worried About Running Out of Food in the Last Year: Never true    Ran Out of Food in the Last Year: Never true  Transportation Needs: No Transportation Needs (10/23/2021)   PRAPARE - Hydrologist (Medical): No    Lack of Transportation (Non-Medical): No  Physical Activity: Inactive (07/18/2021)   Exercise Vital Sign    Days of Exercise per Week: 0 days    Minutes of  Exercise per Session: 0 min  Stress: No Stress Concern Present (10/23/2021)   [place]    Feeling of Stress : Not at all  Social Connections: Unknown (10/19/2020)   Social Connection and Isolation Panel [NHANES]    Frequency of Communication with Friends and Family: Not on file    Frequency of Social Gatherings with Friends and Family: More than three times a week    Attends Religious Services: Not on file    Active Member of Clubs or Organizations: No    Attends Archivist Meetings: Not on file    Marital Status: Not on file     Family History: The patient's family history includes Cancer in his maternal aunt; Colon cancer in his maternal uncle; Diabetes in his brother, mother, sister, sister,  sister, and sister; Diabetes Mellitus II in his brother and mother; Heart disease in his father; Hyperlipidemia in his brother; Tuberculosis in his father. There is no history of Prostate cancer.  ROS:   Please see the history of present illness.     All other systems reviewed and are negative.  EKGs/Labs/Other Studies Reviewed:    The following studies were reviewed today:   EKG:  EKG is ordered today.  EKG shows normal sinus rhythm.  Recent Labs: 03/08/2021: Pro B Natriuretic peptide (BNP) 34.0 11/05/2021: Hemoglobin 12.5; Platelets 531 12/27/2021: ALT 24; BUN 12; Creatinine, Ser 0.71; Potassium 4.3; Sodium 135  Recent Lipid Panel    Component Value Date/Time   CHOL 147 11/13/2021 1131   CHOL 138 02/23/2021 1531   TRIG 123.0 11/13/2021 1131   HDL 43.50 11/13/2021 1131   HDL 41 02/23/2021 1531   CHOLHDL 3 11/13/2021 1131   VLDL 24.6 11/13/2021 1131   LDLCALC 79 11/13/2021 1131   LDLCALC 74 02/23/2021 1531   LDLCALC 40 01/21/2020 1505   LDLDIRECT 89.0 11/13/2021 1131    Physical Exam:    VS:  BP 100/60 (BP Location: Left Arm, Patient Position: Sitting, Cuff Size: Normal)   Pulse (!) 119   Ht _0   (1.626 m)   Wt 176 lb (79.8 kg)   SpO2 98%   BMI 30.21 kg/m     Wt Readings from Last 3 Encounters:  02/28/22 176 lb (79.8 kg)  02/13/22 176 lb 9.6 oz (80.1 kg)  02/05/22 176 lb (79.8 kg)     GEN:  Well nourished, well developed in no acute distress HEENT: Normal NECK: No JVD; No carotid bruits CARDIAC: Tachycardic, regular, no murmurs, rubs, gallops RESPIRATORY:  Clear to auscultation without rales, wheezing or rhonchi  ABDOMEN: Soft, non-tender, non-distended MUSCULOSKELETAL:  No edema; No deformity  SKIN: Warm and dry NEUROLOGIC:  Alert and oriented x 3 PSYCHIATRIC:  Normal affect   ASSESSMENT:    1. Orthostatic hypotension   2. Inappropriate sinus tachycardia   3. Mixed hyperlipidemia   4. Dizziness    PLAN:    In order of problems listed above:  Orthostatic hypotension, symptoms improved with midodrine although still present.  BP today low normal.  Increase midodrine to 10 mg 3 times daily. Inappropriate sinus tachycardia, improved with digoxin.  Continue digoxin 0.125 daily.  Orthostasis possibly contributing. Hyperlipidemia, cholesterol controlled, continue Lipitor  Follow-up 6 months.  Medication Adjustments/Labs and Tests Ordered: Current medicines are reviewed at length with the patient today.  Concerns regarding medicines are outlined above.  Orders Placed This Encounter  Procedures   EKG 12-Lead    Meds ordered this encounter  Medications   midodrine (PROAMATINE) 10 MG tablet    Sig: Take 1 tablet (10 mg total) by mouth 3 (three) times daily with meals.    Dispense:  270 tablet    Refill:  0     Patient Instructions  Medication Instructions:   INCREASE Midodrine - Take one tablet (69m) by mouth three times a day.   IF you have extra of the 5 mg tablets you can take 2 tablets 3 times a day until you run out.   *If you need a refill on your cardiac medications before your next appointment, please call your pharmacy*   Lab Work:  None  Ordered  If you have labs (blood work) drawn today and your tests are completely normal, you will receive your results only by: [place](if you have MyChart) OR  A paper copy in the mail If you have any lab test that is abnormal or we need to change your treatment, we will call you to review the results.   Testing/Procedures:  None Ordered   Follow-Up: At [facility], you and your health needs are our priority.  As part of our continuing mission to provide you with exceptional heart care, we have created designated Provider Care Teams.  These Care Teams include your primary Cardiologist (physician) and Advanced Practice Providers (APPs -  Physician Assistants and Nurse Practitioners) who all work together to provide you with the care you need, when you need it.  We recommend signing up for the patient portal called "MyChart".  Sign up information is provided on this After Visit Summary.  MyChart is used to connect with patients for Virtual Visits (Telemedicine).  Patients are able to view lab/test results, encounter notes, upcoming appointments, etc.  Non-urgent messages can be sent to your provider as well.   To learn more about what you can do with MyChart, go to [email or web address].    Your next appointment:   6 month(s)  The format for your next appointment:   In Person  Provider:   You may see [name], MD or one of the following Advanced Practice Providers on your designated Care Team:   Murray Hodgkins, NP Christell Faith, PA-C Cadence Kathlen Mody, PA-C Gerrie Nordmann, NP     Signed, [name], MD  02/28/2022 1:02 PM    [place]

## 2022-02-28 NOTE — Patient Instructions (Signed)
Medication Instructions:   INCREASE Midodrine - Take one tablet ('10mg'$ ) by mouth three times a day.   IF you have extra of the 5 mg tablets you can take 2 tablets 3 times a day until you run out.   *If you need a refill on your cardiac medications before your next appointment, please call your pharmacy*   Lab Work:  None Ordered  If you have labs (blood work) drawn today and your tests are completely normal, you will receive your results only by: [place] (if you have MyChart) OR A paper copy in the mail If you have any lab test that is abnormal or we need to change your treatment, we will call you to review the results.   Testing/Procedures:  None Ordered   Follow-Up: At [facility], you and your health needs are our priority.  As part of our continuing mission to provide you with exceptional heart care, we have created designated Provider Care Teams.  These Care Teams include your primary Cardiologist (physician) and Advanced Practice Providers (APPs -  Physician Assistants and Nurse Practitioners) who all work together to provide you with the care you need, when you need it.  We recommend signing up for the patient portal called "MyChart".  Sign up information is provided on this After Visit Summary.  MyChart is used to connect with patients for Virtual Visits (Telemedicine).  Patients are able to view lab/test results, encounter notes, upcoming appointments, etc.  Non-urgent messages can be sent to your provider as well.   To learn more about what you can do with MyChart, go to [email or web address].    Your next appointment:   6 month(s)  The format for your next appointment:   In Person  Provider:   You may see [name], MD or one of the following Advanced Practice Providers on your designated Care Team:   Murray Hodgkins, NP Christell Faith, PA-C Cadence Kathlen Mody, PA-C Gerrie Nordmann, NP

## 2022-03-05 DIAGNOSIS — E119 Type 2 diabetes mellitus without complications: Secondary | ICD-10-CM | POA: Diagnosis not present

## 2022-03-05 LAB — HM DIABETES EYE EXAM

## 2022-03-06 ENCOUNTER — Inpatient Hospital Stay: Payer: Medicare Other

## 2022-03-07 NOTE — Progress Notes (Signed)
abstract

## 2022-04-02 ENCOUNTER — Encounter: Payer: Self-pay | Admitting: Oncology

## 2022-04-03 ENCOUNTER — Encounter (INDEPENDENT_AMBULATORY_CARE_PROVIDER_SITE_OTHER): Payer: Self-pay | Admitting: Vascular Surgery

## 2022-04-04 ENCOUNTER — Other Ambulatory Visit (INDEPENDENT_AMBULATORY_CARE_PROVIDER_SITE_OTHER): Payer: Self-pay | Admitting: Nurse Practitioner

## 2022-04-04 ENCOUNTER — Encounter: Payer: Self-pay | Admitting: Family

## 2022-04-04 DIAGNOSIS — D735 Infarction of spleen: Secondary | ICD-10-CM

## 2022-04-04 MED ORDER — CLOPIDOGREL BISULFATE 75 MG PO TABS: 75.0000 mg | ORAL_TABLET | Freq: Every day | ORAL | 3 refills | Status: DC

## 2022-04-04 NOTE — Telephone Encounter (Signed)
Refill sent.

## 2022-04-05 ENCOUNTER — Inpatient Hospital Stay: Payer: Medicare Other

## 2022-04-07 ENCOUNTER — Other Ambulatory Visit: Payer: Self-pay | Admitting: Family

## 2022-04-10 ENCOUNTER — Encounter: Payer: Self-pay | Admitting: Family

## 2022-05-02 ENCOUNTER — Ambulatory Visit
Admission: RE | Admit: 2022-05-02 | Discharge: 2022-05-02 | Disposition: A | Discharge status: Home / Self Care | Payer: 59 | Source: Ambulatory Visit | Attending: Family | Admitting: Family

## 2022-05-02 ENCOUNTER — Other Ambulatory Visit: Payer: Self-pay | Admitting: Family

## 2022-05-02 ENCOUNTER — Encounter: Payer: Self-pay | Admitting: Oncology

## 2022-05-02 DIAGNOSIS — Z87891 Personal history of nicotine dependence: Secondary | ICD-10-CM | POA: Diagnosis not present

## 2022-05-02 DIAGNOSIS — I2510 Atherosclerotic heart disease of native coronary artery without angina pectoris: Secondary | ICD-10-CM | POA: Diagnosis not present

## 2022-05-02 DIAGNOSIS — J439 Emphysema, unspecified: Secondary | ICD-10-CM | POA: Diagnosis not present

## 2022-05-02 DIAGNOSIS — J8410 Pulmonary fibrosis, unspecified: Secondary | ICD-10-CM | POA: Diagnosis not present

## 2022-05-03 ENCOUNTER — Other Ambulatory Visit: Payer: Self-pay | Admitting: Acute Care

## 2022-05-03 DIAGNOSIS — Z87891 Personal history of nicotine dependence: Secondary | ICD-10-CM

## 2022-05-03 DIAGNOSIS — Z122 Encounter for screening for malignant neoplasm of respiratory organs: Secondary | ICD-10-CM

## 2022-05-08 ENCOUNTER — Other Ambulatory Visit: Payer: Medicare Other

## 2022-05-09 ENCOUNTER — Ambulatory Visit: Payer: Medicare Other

## 2022-05-09 ENCOUNTER — Ambulatory Visit: Payer: Medicare Other | Admitting: Oncology

## 2022-05-10 ENCOUNTER — Other Ambulatory Visit: Payer: Self-pay | Admitting: Family

## 2022-05-10 DIAGNOSIS — E785 Hyperlipidemia, unspecified: Secondary | ICD-10-CM

## 2022-05-10 MED ORDER — ATORVASTATIN CALCIUM 40 MG PO TABS: 40.0000 mg | ORAL_TABLET | Freq: Every day | ORAL | 3 refills | Status: DC

## 2022-05-15 ENCOUNTER — Encounter: Payer: Self-pay | Admitting: Family

## 2022-05-15 ENCOUNTER — Ambulatory Visit (INDEPENDENT_AMBULATORY_CARE_PROVIDER_SITE_OTHER): Payer: 59 | Admitting: Family

## 2022-05-15 VITALS — BP 136/78 | HR 102 | Temp 98.0°F | Ht 64.0 in | Wt 173.2 lb

## 2022-05-15 DIAGNOSIS — R7309 Other abnormal glucose: Secondary | ICD-10-CM

## 2022-05-15 DIAGNOSIS — E785 Hyperlipidemia, unspecified: Secondary | ICD-10-CM

## 2022-05-15 DIAGNOSIS — D649 Anemia, unspecified: Secondary | ICD-10-CM | POA: Diagnosis not present

## 2022-05-15 DIAGNOSIS — Z1159 Encounter for screening for other viral diseases: Secondary | ICD-10-CM

## 2022-05-15 DIAGNOSIS — I951 Orthostatic hypotension: Secondary | ICD-10-CM | POA: Diagnosis not present

## 2022-05-15 DIAGNOSIS — E119 Type 2 diabetes mellitus without complications: Secondary | ICD-10-CM | POA: Diagnosis not present

## 2022-05-15 DIAGNOSIS — E538 Deficiency of other specified B group vitamins: Secondary | ICD-10-CM

## 2022-05-15 LAB — COMPREHENSIVE METABOLIC PANEL
ALT: 16 U/L (ref 0–53)
AST: 13 U/L (ref 0–37)
Albumin: 4.3 g/dL (ref 3.5–5.2)
Alkaline Phosphatase: 80 U/L (ref 39–117)
BUN: 18 mg/dL (ref 6–23)
CO2: 26 mEq/L (ref 19–32)
Calcium: 10 mg/dL (ref 8.4–10.5)
Chloride: 102 mEq/L (ref 96–112)
Creatinine, Ser: 0.73 mg/dL (ref 0.40–1.50)
GFR: 101.67 mL/min (ref >60.00)
Glucose, Bld: 134 mg/dL — ABNORMAL HIGH (ref 70–99)
Potassium: 4.2 mEq/L (ref 3.5–5.1)
Sodium: 139 mEq/L (ref 135–145)
Total Bilirubin: 0.4 mg/dL (ref 0.2–1.2)
Total Protein: 7.4 g/dL (ref 6.0–8.3)

## 2022-05-15 LAB — CBC WITH DIFFERENTIAL/PLATELET
Basophils Absolute: 0.1 10*3/uL (ref 0.0–0.1)
Basophils Relative: 0.8 % (ref 0.0–3.0)
Eosinophils Absolute: 0 10*3/uL (ref 0.0–0.7)
Eosinophils Relative: 0.7 % (ref 0.0–5.0)
HCT: 43.1 % (ref 39.0–52.0)
Hemoglobin: 14.2 g/dL (ref 13.0–17.0)
Lymphocytes Relative: 30.6 % (ref 12.0–46.0)
Lymphs Abs: 2.2 10*3/uL (ref 0.7–4.0)
MCHC: 32.9 g/dL (ref 30.0–36.0)
MCV: 84.8 fl (ref 78.0–100.0)
Monocytes Absolute: 0.6 10*3/uL (ref 0.1–1.0)
Monocytes Relative: 7.8 % (ref 3.0–12.0)
Neutro Abs: 4.4 10*3/uL (ref 1.4–7.7)
Neutrophils Relative %: 60.1 % (ref 43.0–77.0)
Platelets: 480 10*3/uL — ABNORMAL HIGH (ref 150.0–400.0)
RBC: 5.08 Mil/uL (ref 4.22–5.81)
RDW: 16.6 % — ABNORMAL HIGH (ref 11.5–15.5)
WBC: 7.3 10*3/uL (ref 4.0–10.5)

## 2022-05-15 LAB — LIPID PANEL
Cholesterol: 137 mg/dL (ref 0–200)
HDL: 40.3 mg/dL (ref >39.00)
LDL Cholesterol: 76 mg/dL (ref 0–99)
NonHDL: 97.01
Total CHOL/HDL Ratio: 3
Triglycerides: 105 mg/dL (ref 0.0–149.0)
VLDL: 21 mg/dL (ref 0.0–40.0)

## 2022-05-15 LAB — IBC + FERRITIN
Ferritin: 6.4 ng/mL — ABNORMAL LOW (ref 22.0–322.0)
Iron: 49 ug/dL (ref 42–165)
Saturation Ratios: 10.2 % — ABNORMAL LOW (ref 20.0–50.0)
TIBC: 481.6 ug/dL — ABNORMAL HIGH (ref 250.0–450.0)
Transferrin: 344 mg/dL (ref 212.0–360.0)

## 2022-05-15 LAB — URINALYSIS, ROUTINE W REFLEX MICROSCOPIC
Bilirubin Urine: NEGATIVE
Hgb urine dipstick: NEGATIVE
Ketones, ur: 15 — AB
Leukocytes,Ua: NEGATIVE
Nitrite: NEGATIVE
RBC / HPF: NONE SEEN (ref 0–?)
Specific Gravity, Urine: 1.02 (ref 1.000–1.030)
Total Protein, Urine: NEGATIVE
Urine Glucose: >=1000 — AB
Urobilinogen, UA: 0.2 (ref 0.0–1.0)
pH: 6 (ref 5.0–8.0)

## 2022-05-15 LAB — POCT GLYCOSYLATED HEMOGLOBIN (HGB A1C): Hemoglobin A1C: 7.6 % — AB (ref 4.0–5.6)

## 2022-05-15 LAB — B12 AND FOLATE PANEL
Folate: 9.9 ng/mL (ref >5.9)
Vitamin B-12: 206 pg/mL — ABNORMAL LOW (ref 211–911)

## 2022-05-15 NOTE — Progress Notes (Signed)
Assessment & Plan:  Diabetes mellitus without complication [name]) Assessment & Plan: Lab Results  Component Value Date   HGBA1C 7.6 (A) 05/15/2022   Improved.  Counseled patient on limiting soda as I suspect he could obtain an A1c of 6.5 if he were to make this change.  Continue Ozempic 2 mg, Jardiance 25 mg  Orders: -     VAS [place] LOWER EXTREMITY ARTERIAL DUPLEX; Future -     Comprehensive metabolic panel -     Lipid panel  Elevated glucose -     POCT glycosylated hemoglobin (Hb A1C)  B12 deficiency -     IBC + Ferritin -     Intrinsic Factor Antibodies -     Homocysteine -     Methylmalonic acid, serum  Anemia, unspecified type -     IBC + Ferritin -     Urinalysis, Routine w reflex microscopic -     B12 and Folate Panel -     CBC with Differential/Platelet -     Comprehensive metabolic panel -     Intrinsic Factor Antibodies -     Homocysteine -     Methylmalonic acid, serum  Encounter for hepatitis C screening test for low risk patient -     Hepatitis C antibody  Orthostatic hypotension Assessment & Plan: Chronic , stable and appears overall improved from prior. Blood pressure initially elevated when patient came into exam room. Improved.  As midodrine has been helpful for patient, advised to be continue midodrine 10 mg twice daily.  He is not taking midodrine 10 mg 3 times daily as prescribed.  We opted not to discontinue or decrease medication at this time   Hyperlipidemia, unspecified hyperlipidemia type Assessment & Plan: Pending lipid panel.  Discussed goal of LDL less than 70. If patient does not obtain goal of LDL on atorvastatin 40 mg daily, we discussed adding Zetia 10 mg      Return precautions given.   Risks, benefits, and alternatives of the medications and treatment plan prescribed today were discussed, and patient expressed understanding.   Education regarding symptom management and diagnosis given to patient on AVS either electronically or  printed.  Return in about 3 months (around 08/15/2022) for Medicare Wellness upcoming or due, schedule.  [name], [name]  Subjective:    Patient ID: Benjamin Conley, male    DOB: 29-May-1965, 57 y.o.   MRN: [id number]  CC: Benjamin Conley is a 57 y.o. male who presents today for follow up.   HPI: Feels well today  No new complaints   Dizziness is unchanged though describes improved from prior and he feels midodrine '10mg'$  bid is helping.    HLD-compliant with Lipitor 40 mg daily  DM- he endorses drinking soda daily.   Allergies: Wasp venom, Wasp venom protein, Coffee flavor, and Onion Current Outpatient Medications on File Prior to Visit  Medication Sig Dispense Refill   aspirin 81 MG EC tablet Take 1 tablet (81 mg total) by mouth daily. 90 tablet 3   atorvastatin (LIPITOR) 40 MG tablet Take 1 tablet (40 mg total) by mouth daily. 90 tablet 3   blood glucose meter kit and supplies Dispense based on patient and insurance preference. Use up to four times daily as directed. (FOR ICD-10 E10.9, E11.9). 1 each 0   Cholecalciferol (VITAMIN D) 125 MCG (5000 UT) CAPS Take 1 capsule by mouth daily.     clopidogrel (PLAVIX) 75 MG tablet Take 1 tablet (75 mg total)  by mouth daily. 90 tablet 3   Continuous Blood Gluc Sensor (FREESTYLE LIBRE 2 SENSOR) MISC USE AS DIRECTED ONE EVERY 14 DAYS 2 each 0   digoxin (LANOXIN) 0.125 MG tablet Take 1 tablet (0.125 mg total) by mouth daily. 30 tablet 3   empagliflozin (JARDIANCE) 25 MG TABS tablet Take 1 tablet (25 mg total) by mouth daily before breakfast. 90 tablet 1   Insulin Pen Needle 32G X 4 MM MISC Use as directed to administer insulin 100 each 2   midodrine (PROAMATINE) 10 MG tablet Take 1 tablet (10 mg total) by mouth 3 (three) times daily with meals. 270 tablet 0   nystatin-triamcinolone ointment (MYCOLOG) Apply 1 application topically 2 (two) times daily. 60 g 0   Semaglutide, 2 MG/DOSE, 8 MG/3ML SOPN Inject 2 mg as directed once a week. 3  mL 2   sildenafil (VIAGRA) 100 MG tablet Take 1 tablet (100 mg total) by mouth daily as needed for erectile dysfunction. 30 tablet 0   triamcinolone cream (KENALOG) 0.1 % Apply 1 application topically 2 (two) times daily. 30 g 0   No current facility-administered medications on file prior to visit.    Review of Systems  Constitutional:  Negative for chills and fever.  Respiratory:  Negative for cough.   Cardiovascular:  Negative for chest pain and palpitations.  Gastrointestinal:  Negative for nausea and vomiting.  Neurological:  Positive for dizziness.      Objective:    BP 136/78   Pulse (!) 102   Temp 98 F (36.7 C) (Oral)   Ht '5\' 4"'$  (1.626 m)   Wt 173 lb 3.2 oz (78.6 kg)   SpO2 99%   BMI 29.73 kg/m  BP Readings from Last 3 Encounters:  05/15/22 136/78  02/28/22 100/60  02/13/22 130/78   Wt Readings from Last 3 Encounters:  05/15/22 173 lb 3.2 oz (78.6 kg)  02/28/22 176 lb (79.8 kg)  02/13/22 176 lb 9.6 oz (80.1 kg)    Physical Exam Vitals reviewed.  Constitutional:      Appearance: He is well-developed.  Cardiovascular:     Rate and Rhythm: Regular rhythm.     Heart sounds: Normal heart sounds.  Pulmonary:     Effort: Pulmonary effort is normal. No respiratory distress.     Breath sounds: Normal breath sounds. No wheezing, rhonchi or rales.  Skin:    General: Skin is warm and dry.  Neurological:     Mental Status: He is alert.  Psychiatric:        Speech: Speech normal.        Behavior: Behavior normal.

## 2022-05-15 NOTE — Patient Instructions (Signed)
Decrease use of soda.   Nice to see you!

## 2022-05-15 NOTE — Assessment & Plan Note (Signed)
Lab Results  Component Value Date   HGBA1C 7.6 (A) 05/15/2022   Improved.  Counseled patient on limiting soda as I suspect he could obtain an A1c of 6.5 if he were to make this change.  Continue Ozempic 2 mg, Jardiance 25 mg

## 2022-05-15 NOTE — Assessment & Plan Note (Signed)
Pending lipid panel.  Discussed goal of LDL less than 70. If patient does not obtain goal of LDL on atorvastatin 40 mg daily, we discussed adding Zetia 10 mg

## 2022-05-15 NOTE — Assessment & Plan Note (Signed)
Chronic , stable and appears overall improved from prior. Blood pressure initially elevated when patient came into exam room. Improved.  As midodrine has been helpful for patient, advised to be continue midodrine 10 mg twice daily.  He is not taking midodrine 10 mg 3 times daily as prescribed.  We opted not to discontinue or decrease medication at this time

## 2022-05-16 NOTE — Telephone Encounter (Signed)
Downloaded report and placed in provider folder

## 2022-05-17 LAB — METHYLMALONIC ACID, SERUM: Methylmalonic Acid, Quant: 211 nmol/L (ref 87–318)

## 2022-05-17 LAB — HOMOCYSTEINE: Homocysteine: 9.1 umol/L (ref <11.4)

## 2022-05-17 LAB — HEPATITIS C ANTIBODY: Hepatitis C Ab: NONREACTIVE

## 2022-05-17 LAB — INTRINSIC FACTOR ANTIBODIES: Intrinsic Factor: NEGATIVE

## 2022-05-21 ENCOUNTER — Encounter: Payer: Self-pay | Admitting: Family

## 2022-05-24 DIAGNOSIS — Z0279 Encounter for issue of other medical certificate: Secondary | ICD-10-CM

## 2022-05-24 NOTE — Telephone Encounter (Signed)
Done letter sent in mychart pt is aware

## 2022-06-04 ENCOUNTER — Other Ambulatory Visit: Payer: Self-pay | Admitting: Family

## 2022-06-05 ENCOUNTER — Other Ambulatory Visit: Payer: Self-pay | Admitting: Family

## 2022-06-17 ENCOUNTER — Other Ambulatory Visit: Payer: Self-pay | Admitting: Family

## 2022-06-17 DIAGNOSIS — E119 Type 2 diabetes mellitus without complications: Secondary | ICD-10-CM

## 2022-06-19 ENCOUNTER — Encounter: Payer: Self-pay | Admitting: Oncology

## 2022-06-29 ENCOUNTER — Encounter: Payer: Self-pay | Admitting: Family

## 2022-06-30 ENCOUNTER — Other Ambulatory Visit: Payer: Self-pay | Admitting: Family

## 2022-07-01 NOTE — Telephone Encounter (Signed)
Spoke to pt and informed him that his appt is on 07/19/22 @  11am

## 2022-07-16 ENCOUNTER — Other Ambulatory Visit (INDEPENDENT_AMBULATORY_CARE_PROVIDER_SITE_OTHER): Payer: Self-pay | Admitting: Vascular Surgery

## 2022-07-16 DIAGNOSIS — I748 Embolism and thrombosis of other arteries: Secondary | ICD-10-CM

## 2022-07-19 ENCOUNTER — Ambulatory Visit (INDEPENDENT_AMBULATORY_CARE_PROVIDER_SITE_OTHER): Payer: 59 | Admitting: Vascular Surgery

## 2022-07-19 ENCOUNTER — Ambulatory Visit (INDEPENDENT_AMBULATORY_CARE_PROVIDER_SITE_OTHER): Payer: 59

## 2022-07-19 ENCOUNTER — Encounter (INDEPENDENT_AMBULATORY_CARE_PROVIDER_SITE_OTHER): Payer: Self-pay | Admitting: Vascular Surgery

## 2022-07-19 VITALS — BP 122/86 | HR 96 | Resp 18 | Ht 64.0 in | Wt 169.6 lb

## 2022-07-19 DIAGNOSIS — I10 Essential (primary) hypertension: Secondary | ICD-10-CM | POA: Diagnosis not present

## 2022-07-19 DIAGNOSIS — I748 Embolism and thrombosis of other arteries: Secondary | ICD-10-CM | POA: Diagnosis not present

## 2022-07-19 DIAGNOSIS — I739 Peripheral vascular disease, unspecified: Secondary | ICD-10-CM | POA: Insufficient documentation

## 2022-07-19 DIAGNOSIS — E119 Type 2 diabetes mellitus without complications: Secondary | ICD-10-CM

## 2022-07-19 DIAGNOSIS — E785 Hyperlipidemia, unspecified: Secondary | ICD-10-CM

## 2022-07-19 NOTE — Assessment & Plan Note (Signed)
blood glucose control important in reducing the progression of atherosclerotic disease. Also, involved in wound healing. On appropriate medications.  

## 2022-07-19 NOTE — Assessment & Plan Note (Signed)
lipid control important in reducing the progression of atherosclerotic disease. Continue statin therapy  

## 2022-07-19 NOTE — Assessment & Plan Note (Signed)
Duplex studies today show 30 to 49% right common femoral artery stenosis with no other elevated velocities in the right lower extremity.  The left lower extremity shows biphasic and triphasic waveforms throughout the left lower extremity without significant stenosis identified.  No role for intervention at this level.  Unlikely the cause of any current symptoms.  I will plan to recheck ABIs at his next visit.

## 2022-07-19 NOTE — Assessment & Plan Note (Signed)
His mesenteric duplex today shows normal findings in the celiac artery and branches including the splenic artery and normal flow in the superior mesenteric arteries well.  Doing well.  No change in his regimen.  Recheck in 1 year.

## 2022-07-19 NOTE — Progress Notes (Signed)
MRN : [id number]  Benjamin Conley is a 57 y.o. (1966/02/21) male who presents with chief complaint of  Chief Complaint  Patient presents with   Follow-up    f/u in 1 year with Mesenteric  .  History of Present Illness: Patient returns today in follow up of multiple vascular issues.  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  We were able to revascularize the splenic artery and the stent was placed.  He is currently having no abdominal pain or problems relative to this.  His mesenteric duplex today shows normal findings in the celiac artery and branches including the splenic artery and normal flow in the superior mesenteric arteries well. He also has a lot of neuropathic pain in the lower extremities.  He has multiple atherosclerotic risk factors and concern for arterial malperfusion was present.  Noninvasive studies were performed today. Duplex studies today show 30 to 49% right common femoral artery stenosis with no other elevated velocities in the right lower extremity.  The left lower extremity shows biphasic and triphasic waveforms throughout the left lower extremity without significant stenosis identified.   Current Outpatient Medications  Medication Sig Dispense Refill   aspirin 81 MG EC tablet Take 1 tablet (81 mg total) by mouth daily. 90 tablet 3   atorvastatin (LIPITOR) 40 MG tablet Take 1 tablet (40 mg total) by mouth daily. 90 tablet 3   blood glucose meter kit and supplies Dispense based on patient and insurance preference. Use up to four times daily as directed. (FOR ICD-10 E10.9, E11.9). 1 each 0   Cholecalciferol (VITAMIN D) 125 MCG (5000 UT) CAPS Take 1 capsule by mouth daily.     clopidogrel (PLAVIX) 75 MG tablet Take 1 tablet (75 mg total) by mouth daily. 90 tablet 3   Continuous Glucose Sensor (FREESTYLE LIBRE 2 SENSOR) MISC USE 1 SENSOR EVERY 14 DAYS 2 each 0   digoxin (LANOXIN) 0.125 MG tablet Take 1 tablet (0.125 mg total) by mouth  daily. 30 tablet 3   empagliflozin (JARDIANCE) 25 MG TABS tablet Take 1 tablet (25 mg total) by mouth daily before breakfast. 90 tablet 1   Insulin Pen Needle 32G X 4 MM MISC Use as directed to administer insulin 100 each 2   nystatin-triamcinolone ointment (MYCOLOG) Apply 1 application topically 2 (two) times daily. 60 g 0   OZEMPIC, 2 MG/DOSE, 8 MG/3ML SOPN INJECT 1 DOSE (2MG ) SUBCUTANEOUSLY ONCE A WEEK 9 mL 0   sildenafil (VIAGRA) 100 MG tablet Take 1 tablet (100 mg total) by mouth daily as needed for erectile dysfunction. 30 tablet 0   triamcinolone cream (KENALOG) 0.1 % Apply 1 application topically 2 (two) times daily. 30 g 0   JARDIANCE 10 MG TABS tablet Take 10 mg by mouth every morning. (Patient not taking: Reported on 07/19/2022)     No current facility-administered medications for this visit.    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 ENDOSCOPY;  Service: Endoscopy;  Laterality: N/A;   STENT PLACEMENT VASCULAR ([facility] HX)  07/2019   stenosis of distal splenic artery and stent placed   VISCERAL ANGIOGRAPHY N/A 08/06/2019   Procedure: VISCERAL  ANGIOGRAPHY;  Surgeon: Renford Dills, MD;  Location: [facility] INVASIVE CV LAB;  Service: Cardiovascular;  Laterality: N/A;     Social History   Tobacco Use   Smoking status: Former    Packs/day: 2.00    Years: 48.00    Additional pack years: 0.00    Total pack years: 96.00    Types: Cigarettes    Quit date: 02/12/2020    Years since quitting: 2.4   Smokeless tobacco: Never  Vaping Use   Vaping Use: Never used  Substance Use Topics   Alcohol use: No    Comment: Never been a problem   Drug use: No       Family History  Problem Relation Age of Onset   Diabetes Mother    Diabetes Mellitus II Mother    Tuberculosis Father    Heart disease Father    Diabetes Sister    Diabetes Sister    Diabetes Sister    Diabetes Sister    Diabetes Brother    Diabetes Mellitus II Brother    Hyperlipidemia Brother    Cancer Maternal Aunt    Colon cancer Maternal Uncle    Prostate cancer Neg Hx      Allergies  Allergen Reactions   Wasp Venom Anaphylaxis   Wasp Venom Protein Anaphylaxis   Coffee Flavor Nausea And Vomiting    Patient states coffee gives him nausea and stomach cramps   Onion Nausea And Vomiting     REVIEW OF SYSTEMS (Negative unless checked)  Constitutional: [] Weight loss  [] Fever  [] Chills Cardiac: [] Chest pain   [] Chest pressure   [] Palpitations   [] Shortness of breath when laying flat   [] Shortness of breath at rest   [x] Shortness of breath with exertion. Vascular:  [x] Pain in legs with walking   [] Pain in legs at rest   [] Pain in legs when laying flat   [] Claudication   [] Pain in feet when walking  [] Pain in feet at rest  [] Pain in feet when laying flat   [] History of DVT   [] Phlebitis   [] Swelling in legs   [] Varicose veins   [] Non-healing ulcers Pulmonary:   [] Uses home oxygen   [] Productive cough   [] Hemoptysis   [] Wheeze  [] COPD   [] Asthma Neurologic:  [] Dizziness  [] Blackouts   [] Seizures   [] History of stroke   [] History of TIA  [] Aphasia   [] Temporary  blindness   [] Dysphagia   [] Weakness or numbness in arms   [x] Weakness or numbness in legs Musculoskeletal:  [x] Arthritis   [] Joint swelling   [x] Joint pain   [] Low back pain Hematologic:  [] Easy bruising  [] Easy bleeding   [] Hypercoagulable state   [] Anemic   Gastrointestinal:  [] Blood in stool   [] Vomiting blood  [] Gastroesophageal reflux/heartburn   [] Abdominal pain Genitourinary:  [] Chronic kidney disease   [] Difficult urination  [] Frequent urination  [] Burning with urination   [] Hematuria Skin:  [] Rashes   [] Ulcers   [] Wounds Psychological:  [] History of anxiety   []   History of major depression.  Physical Examination  BP 122/86 (BP Location: Left Arm)   Pulse 96   Resp 18   Ht 5\' 4"  (1.626 m)   Wt 169 lb 9.6 oz (76.9 kg)   BMI 29.11 kg/m  Gen:  WD/WN, NAD. Appears older than stated age. Head: [place]/AT, No temporalis wasting. Ear/Nose/Throat: Hearing grossly intact, nares w/o erythema or drainage Eyes: Conjunctiva clear. Sclera non-icteric Neck: Supple.  Trachea midline Pulmonary:  Good air movement, no use of accessory muscles.  Cardiac: RRR, no JVD Vascular:  Vessel Right Left  Radial Palpable Palpable                          PT Palpable Palpable  DP Palpable Palpable   Gastrointestinal: soft, non-tender/non-distended. No guarding/reflex.  Musculoskeletal: M/S 5/5 throughout.  No deformity or atrophy. No edema. Neurologic: Sensation grossly intact in extremities.  Symmetrical.  Speech is fluent.  Psychiatric: Judgment intact, Mood & affect appropriate for pt's clinical situation. Dermatologic: No rashes or ulcers noted.  No cellulitis or open wounds.      Labs Recent Results (from the past 2160 hour(s))  POCT HgB A1C     Status: Abnormal   Collection Time: 05/15/22  8:12 AM  Result Value Ref Range   Hemoglobin A1C 7.6 (A) 4.0 - 5.6 %   HbA1c POC (<> result, manual entry)     HbA1c, POC (prediabetic range)     HbA1c, POC (controlled diabetic range)    IBC +  Ferritin     Status: Abnormal   Collection Time: 05/15/22  8:49 AM  Result Value Ref Range   Iron 49 42 - 165 ug/dL   Transferrin 528.4 132.4 - 360.0 mg/dL   Saturation Ratios 40.1 (L) 20.0 - 50.0 %   Ferritin 6.4 (L) 22.0 - 322.0 ng/mL   TIBC 481.6 (H) 250.0 - 450.0 mcg/dL  U27 and Folate Panel     Status: Abnormal   Collection Time: 05/15/22  8:49 AM  Result Value Ref Range   Vitamin B-12 206 (L) 211 - 911 pg/mL   Folate 9.9 >5.9 ng/mL  CBC with Differential/Platelet     Status: Abnormal   Collection Time: 05/15/22  8:49 AM  Result Value Ref Range   WBC 7.3 4.0 - 10.5 K/uL   RBC 5.08 4.22 - 5.81 Mil/uL   Hemoglobin 14.2 13.0 - 17.0 g/dL   HCT 25.3 66.4 - 40.3 %   MCV 84.8 78.0 - 100.0 fl   MCHC 32.9 30.0 - 36.0 g/dL   RDW 47.4 (H) 25.9 - 56.3 %   Platelets 480.0 (H) 150.0 - 400.0 K/uL   Neutrophils Relative % 60.1 43.0 - 77.0 %   Lymphocytes Relative 30.6 12.0 - 46.0 %   Monocytes Relative 7.8 3.0 - 12.0 %   Eosinophils Relative 0.7 0.0 - 5.0 %   Basophils Relative 0.8 0.0 - 3.0 %   Neutro Abs 4.4 1.4 - 7.7 K/uL   Lymphs Abs 2.2 0.7 - 4.0 K/uL   Monocytes Absolute 0.6 0.1 - 1.0 K/uL   Eosinophils Absolute 0.0 0.0 - 0.7 K/uL   Basophils Absolute 0.1 0.0 - 0.1 K/uL  Comprehensive metabolic panel     Status: Abnormal   Collection Time: 05/15/22  8:49 AM  Result Value Ref Range   Sodium 139 135 - 145 mEq/L   Potassium 4.2 3.5 - 5.1 mEq/L   Chloride 102 96 - 112 mEq/L   CO2 26  19 - 32 mEq/L   Glucose, Bld 134 (H) 70 - 99 mg/dL   BUN 18 6 - 23 mg/dL   Creatinine, Ser 9.52 0.40 - 1.50 mg/dL   Total Bilirubin 0.4 0.2 - 1.2 mg/dL   Alkaline Phosphatase 80 39 - 117 U/L   AST 13 0 - 37 U/L   ALT 16 0 - 53 U/L   Total Protein 7.4 6.0 - 8.3 g/dL   Albumin 4.3 3.5 - 5.2 g/dL   GFR 841.32 >44.01 mL/min    Comment: Calculated using the CKD-EPI Creatinine Equation (2021)   Calcium 10.0 8.4 - 10.5 mg/dL  Lipid panel     Status: None   Collection Time: 05/15/22  8:49 AM  Result  Value Ref Range   Cholesterol 137 0 - 200 mg/dL    Comment: ATP III Classification       Desirable:  < 200 mg/dL               Borderline High:  200 - 239 mg/dL          High:  > = 027 mg/dL   Triglycerides 253.6 0.0 - 149.0 mg/dL    Comment: Normal:  <644 mg/dLBorderline High:  150 - 199 mg/dL   HDL 03.47 >42.59 mg/dL   VLDL 56.3 0.0 - 87.5 mg/dL   LDL Cholesterol 76 0 - 99 mg/dL   Total CHOL/HDL Ratio 3     Comment:                Men          Women1/2 Average Risk     3.4          3.3Average Risk          5.0          4.42X Average Risk          9.6          7.13X Average Risk          15.0          11.0                       NonHDL 97.01     Comment: NOTE:  Non-HDL goal should be 30 mg/dL higher than patient's LDL goal (i.e. LDL goal of < 70 mg/dL, would have non-HDL goal of < 100 mg/dL)  Intrinsic Factor Antibodies     Status: None   Collection Time: 05/15/22  8:49 AM  Result Value Ref Range   Intrinsic Factor Negative Negative    Comment: . For additional information, please refer to http://education.questdiagnostics.com/faq/IFAB (This link is being provided for informational/  educational purposes only.) .   Homocysteine     Status: None   Collection Time: 05/15/22  8:49 AM  Result Value Ref Range   Homocysteine 9.1 <11.4 umol/L    Comment: Homocysteine is increased by functional deficiency of  folate or vitamin B12. Testing for methylmalonic acid  differentiates between these deficiencies. Other causes  of increased homocysteine include renal failure, folate  antagonists such as methotrexate and phenytoin, and  exposure to nitrous oxide. Jeani Sow, et al., Ann Intern Med. 1999;131(5):331-9.   Methylmalonic acid, serum     Status: None   Collection Time: 05/15/22  8:49 AM  Result Value Ref Range   Methylmalonic Acid, Quant 211 87 - 318 nmol/L    Comment: . This test was developed and its analytical performance characteristics have been  determined by [facility] [place], [place]. It has not been cleared or approved by the U.S. Food and Drug Administration. This assay has been validated pursuant to the CLIA regulations and is used for clinical purposes. .   Hepatitis C antibody     Status: None   Collection Time: 05/15/22  8:49 AM  Result Value Ref Range   Hepatitis C Ab NON-REACTIVE NON-REACTIVE    Comment: . HCV antibody was non-reactive. There is no laboratory  evidence of HCV infection. . In most cases, no further action is required. However, if recent HCV exposure is suspected, a test for HCV RNA (test code 40981) is suggested. . For additional information please refer to http://education.questdiagn[facility].com/faq/FAQ22v1 (This link is being provided for informational/ educational purposes only.) .   Urinalysis, Routine w reflex microscopic     Status: Abnormal   Collection Time: 05/15/22  8:50 AM  Result Value Ref Range   Color, Urine YELLOW Yellow;Lt. Yellow;Straw;Dark Yellow;Amber;Green;Red;Brown   APPearance CLEAR Clear;Turbid;Slightly Cloudy;Cloudy   Specific Gravity, Urine 1.020 1.000 - 1.030   pH 6.0 5.0 - 8.0   Total Protein, Urine NEGATIVE Negative   Urine Glucose >=1000 (A) Negative   Ketones, ur 15 (A) Negative   Bilirubin Urine NEGATIVE Negative   Hgb urine dipstick NEGATIVE Negative   Urobilinogen, UA 0.2 0.0 - 1.0   Leukocytes,Ua NEGATIVE Negative   Nitrite NEGATIVE Negative   WBC, UA 0-2/hpf 0-2/hpf   RBC / HPF none seen 0-2/hpf    Radiology No results found.  Assessment/Plan  Hypertension blood pressure control important in reducing the progression of atherosclerotic disease. On appropriate oral medications.   PAD (peripheral artery disease) (HCC) Duplex studies today show 30 to 49% right common femoral artery stenosis with no other elevated velocities in the right lower extremity.  The left lower extremity shows biphasic and triphasic waveforms throughout the left lower extremity  without significant stenosis identified.  No role for intervention at this level.  Unlikely the cause of any current symptoms.  I will plan to recheck ABIs at his next visit.  Thrombosis of splenic artery (HCC) His mesenteric duplex today shows normal findings in the celiac artery and branches including the splenic artery and normal flow in the superior mesenteric arteries well.  Doing well.  No change in his regimen.  Recheck in 1 year.  Diabetes mellitus without complication (HCC) blood glucose control important in reducing the progression of atherosclerotic disease. Also, involved in wound healing. On appropriate medications.   Hyperlipidemia lipid control important in reducing the progression of atherosclerotic disease. Continue statin therapy    Festus Barren, MD  07/19/2022 11:35 AM    This note was created with Dragon medical transcription system.  Any errors from dictation are purely unintentional

## 2022-07-19 NOTE — Assessment & Plan Note (Signed)
blood pressure control important in reducing the progression of atherosclerotic disease. On appropriate oral medications.  

## 2022-07-24 ENCOUNTER — Ambulatory Visit: Payer: Self-pay

## 2022-07-24 ENCOUNTER — Telehealth: Payer: Self-pay | Admitting: Family

## 2022-07-24 ENCOUNTER — Telehealth: Payer: Self-pay

## 2022-07-24 ENCOUNTER — Encounter: Payer: Self-pay | Admitting: Family

## 2022-07-24 MED ORDER — FREESTYLE LIBRE 3 SENSOR MISC: 2 refills | Status: DC

## 2022-07-24 NOTE — Patient Outreach (Signed)
  Care Coordination   07/24/2022 Name: Benjamin Conley MRN: [id number] DOB: [birth year]   Care Coordination Outreach Attempts:  An unsuccessful telephone outreach was attempted today to offer the patient information about available care coordination services.  Follow Up Plan:  Additional outreach attempts will be made to offer the patient care coordination information and services.   Encounter Outcome:  No Answer   Care Coordination Interventions:  No, not indicated    SIG Lysle Morales, BSW Social Worker [facility] Care Management  [phone]

## 2022-07-24 NOTE — Telephone Encounter (Signed)
Libre 3 sent in to pharmacy pt is aware

## 2022-07-24 NOTE — Patient Outreach (Signed)
  Care Coordination   Initial Visit Note   07/24/2022 Name: Benjamin Conley MRN: [id number] DOB: 1965-06-07  Benjamin Conley is a 57 y.o. year old male who sees Arnett, Lyn Records, [name] for primary care. I spoke with  Benjamin Conley by phone today.  What matters to the patients health and wellness today?  CM educated patient on [facility] services.  Patient declines services and feels that he is able to manage his medical needs.  Patient agreed to contact his provider in the future if he needs [facility] services.     Goals Addressed   None     SDOH assessments and interventions completed:  No     Care Coordination Interventions:  No, not indicated   Follow up plan: No further intervention required.   Encounter Outcome:  Pt. Refused

## 2022-07-24 NOTE — Telephone Encounter (Signed)
Patient returned call from [facility].  I could not addend the note from [facility].  I spoke with Lysle Morales and she states she will call the patient back as soon as I am off the phone with him.  I relayed message to patient.

## 2022-07-24 NOTE — Telephone Encounter (Signed)
Pt called stating he would like libre three instead of libre two because its giving him false information

## 2022-07-25 NOTE — Telephone Encounter (Signed)
Printed off and placed on provider desk

## 2022-08-01 ENCOUNTER — Other Ambulatory Visit: Payer: Self-pay | Admitting: Family

## 2022-08-30 ENCOUNTER — Ambulatory Visit: Payer: 59 | Attending: Cardiology | Admitting: Cardiology

## 2022-08-30 ENCOUNTER — Encounter: Payer: Self-pay | Admitting: Cardiology

## 2022-08-30 ENCOUNTER — Other Ambulatory Visit: Payer: Self-pay

## 2022-08-30 VITALS — BP 136/76 | HR 103 | Ht 65.0 in | Wt 173.8 lb

## 2022-08-30 DIAGNOSIS — I951 Orthostatic hypotension: Secondary | ICD-10-CM

## 2022-08-30 DIAGNOSIS — I4711 Inappropriate sinus tachycardia, so stated: Secondary | ICD-10-CM | POA: Diagnosis not present

## 2022-08-30 DIAGNOSIS — E782 Mixed hyperlipidemia: Secondary | ICD-10-CM | POA: Diagnosis not present

## 2022-08-30 MED ORDER — MIDODRINE HCL 5 MG PO TABS: 10.0000 mg | ORAL_TABLET | Freq: Every morning | ORAL | 0 refills | Status: DC

## 2022-08-30 NOTE — Patient Instructions (Signed)
Medication Instructions:   Call yo confirm how you are taking midodrine.  It should be 10 mg by mouth twice a day.   *If you need a refill on your cardiac medications before your next appointment, please call your pharmacy*   Lab Work:  None Ordered  If you have labs (blood work) drawn today and your tests are completely normal, you will receive your results only by: MyChart Message (if you have MyChart) OR A paper copy in the mail If you have any lab test that is abnormal or we need to change your treatment, we will call you to review the results.   Testing/Procedures:  None Ordered   Follow-Up: At [facility], you and your health needs are our priority.  As part of our continuing mission to provide you with exceptional heart care, we have created designated Provider Care Teams.  These Care Teams include your primary Cardiologist (physician) and Advanced Practice Providers (APPs -  Physician Assistants and Nurse Practitioners) who all work together to provide you with the care you need, when you need it.  We recommend signing up for the patient portal called "MyChart".  Sign up information is provided on this After Visit Summary.  MyChart is used to connect with patients for Virtual Visits (Telemedicine).  Patients are able to view lab/test results, encounter notes, upcoming appointments, etc.  Non-urgent messages can be sent to your provider as well.   To learn more about what you can do with MyChart, go to [email or web address].    Your next appointment:   6 month(s)  Provider:   You may see Debbe Odea, MD or one of the following Advanced Practice Providers on your designated Care Team:   Nicolasa Ducking, NP Eula Listen, PA-C Cadence Fransico Michael, PA-C Charlsie Quest, NP

## 2022-08-30 NOTE — Progress Notes (Signed)
Cardiology Office Note:    Date:  08/30/2022   ID:  Benjamin Conley, DOB 05-30-1965, MRN [id number]  PCP:  Allegra Grana, [name]  [facility] Cardiologist:  Debbe Odea, MD  [facility] Electrophysiologist:  None   Referring MD: Allegra Grana, [name]   Chief Complaint  Patient presents with   Follow-up    Patient denies new or acute cardiac problems/concerns today.     History of Present Illness:    Benjamin Conley is a 57 y.o. male with a hx of diabetes, orthostatic hypotension, inappropriate sinus tach, splenic infarct s/p stent placement 2021, COPD, hyperlipidemia, former smoker x40+ years who presents for follow-up.   States midodrine has helped with symptoms of dizziness, orthostasis.  Currently takes midodrine 10 mg twice daily, although for some reason is not listed on his medications.  Otherwise feels okay, compliant with medications as prescribed.   Prior notes Echo 09/2019 normal systolic and diastolic function, EF 55 to 60%. Lexiscan Myoview 08/2019 low risk, no evidence for ischemia, no significant coronary artery calcification Cardiac monitor on May/2022, no significant arrhythmias.  Patient had worsening left upper quadrant abdominal pain.  He presented to the ED  where CT abdomen dated 07/2019 showed distal splenic aneurysm no greater than 6 mm, small dissection, likely source of splenic infarctions.  He was diagnosed with splenic infarct and had a stent placed to the splenic artery.    Denies palpitations or irregular heartbeats.  States his heart rates have been elevated over the past 2 to 3 weeks, up to 120 bpm upon checks at home.   Past Medical History:  Diagnosis Date   Acute posthemorrhagic anemia    Complication of anesthesia    c/o difficulty breathing after anesthesia   COPD (chronic obstructive pulmonary disease) (HCC)    Diabetes mellitus without complication (HCC)    Hyperlipemia    Melena 08/21/2019   Paroxysmal supraventricular  tachycardia 08/19/2019   Postural dizziness with presyncope 08/21/2019   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   Tobacco abuse    Upper GI bleed 08/21/2019    Past Surgical History:  Procedure Laterality Date   BACK SURGERY     lumbar   COLONOSCOPY WITH PROPOFOL N/A 08/24/2019   Procedure: COLONOSCOPY WITH PROPOFOL;  Surgeon: Midge Minium, MD;  Location: [facility] ENDOSCOPY;  Service: Endoscopy;  Laterality: N/A;   ESOPHAGOGASTRODUODENOSCOPY (EGD) WITH PROPOFOL N/A 08/24/2019   Procedure: ESOPHAGOGASTRODUODENOSCOPY (EGD) WITH PROPOFOL;  Surgeon: Midge Minium, MD;  Location: [facility] ENDOSCOPY;  Service: Endoscopy;  Laterality: N/A;   STENT PLACEMENT VASCULAR ([facility] HX)  07/2019   stenosis of distal splenic artery and stent placed   VISCERAL ANGIOGRAPHY N/A 08/06/2019   Procedure: VISCERAL ANGIOGRAPHY;  Surgeon: Renford Dills, MD;  Location: [facility] INVASIVE CV LAB;  Service: Cardiovascular;  Laterality: N/A;    Current Medications: Current Meds  Medication Sig   aspirin 81 MG EC tablet Take 1 tablet (81 mg total) by mouth daily.   atorvastatin (LIPITOR) 40 MG tablet Take 1 tablet (40 mg total) by mouth daily.   blood glucose meter kit and supplies Dispense based on patient and insurance preference. Use up to four times daily as directed. (FOR ICD-10 E10.9, E11.9).   clopidogrel (PLAVIX) 75 MG tablet Take 1 tablet (75 mg total) by mouth daily.   Continuous Glucose Sensor (FREESTYLE LIBRE 3 SENSOR) MISC Place 1 sensor on the skin every 14 days. Use to check glucose continuously   digoxin (LANOXIN) 0.125 MG tablet Take 1 tablet (  0.125 mg total) by mouth daily.   empagliflozin (JARDIANCE) 25 MG TABS tablet Take 1 tablet (25 mg total) by mouth daily before breakfast.   nystatin-triamcinolone ointment (MYCOLOG) Apply 1 application topically 2 (two) times daily.   OZEMPIC, 2 MG/DOSE, 8 MG/3ML SOPN INJECT 1 DOSE (2MG ) SUBCUTANEOUSLY ONCE A WEEK   sildenafil (VIAGRA) 100 MG tablet Take 1 tablet (100 mg total) by  mouth daily as needed for erectile dysfunction.   triamcinolone cream (KENALOG) 0.1 % Apply 1 application topically 2 (two) times daily.     Allergies:   Wasp venom, Wasp venom protein, Coffee flavor, and Onion   Social History   Socioeconomic History   Marital status: Single    Spouse name: Not on file   Number of children: Not on file   Years of education: Not on file   Highest education level: Not on file  Occupational History   Not on file  Tobacco Use   Smoking status: Former    Packs/day: 2.00    Years: 48.00    Additional pack years: 0.00    Total pack years: 96.00    Types: Cigarettes    Quit date: 02/12/2020    Years since quitting: 2.5   Smokeless tobacco: Never  Vaping Use   Vaping Use: Never used  Substance and Sexual Activity   Alcohol use: No    Comment: Never been a problem   Drug use: No   Sexual activity: Yes  Other Topics Concern   Not on file  Social History Narrative   Single, lives alone, unemployed.   Social Determinants of Health   Financial Resource Strain: Low Risk  (10/23/2021)   Overall Financial Resource Strain (CARDIA)    Difficulty of Paying Living Expenses: Not hard at all  Food Insecurity: No Food Insecurity (10/23/2021)   Hunger Vital Sign    Worried About Running Out of Food in the Last Year: Never true    Ran Out of Food in the Last Year: Never true  Transportation Needs: No Transportation Needs (10/23/2021)   PRAPARE - Administrator, Civil Service (Medical): No    Lack of Transportation (Non-Medical): No  Physical Activity: Inactive (07/18/2021)   Exercise Vital Sign    Days of Exercise per Week: 0 days    Minutes of Exercise per Session: 0 min  Stress: No Stress Concern Present (10/23/2021)   [employer] of Occupational Health - Occupational Stress Questionnaire    Feeling of Stress : Not at all  Social Connections: Unknown (10/19/2020)   Social Connection and Isolation Panel [NHANES]    Frequency of  Communication with Friends and Family: Not on file    Frequency of Social Gatherings with Friends and Family: More than three times a week    Attends Religious Services: Not on file    Active Member of Clubs or Organizations: No    Attends Banker Meetings: Not on file    Marital Status: Not on file     Family History: The patient's family history includes Cancer in his maternal aunt; Colon cancer in his maternal uncle; Diabetes in his brother, mother, sister, sister, sister, and sister; Diabetes Mellitus II in his brother and mother; Heart disease in his father; Hyperlipidemia in his brother; Tuberculosis in his father. There is no history of Prostate cancer.  ROS:   Please see the history of present illness.     All other systems reviewed and are negative.  EKGs/Labs/Other Studies Reviewed:  The following studies were reviewed today:   EKG:  EKG is ordered today.  EKG shows normal sinus rhythm.  Recent Labs: 05/15/2022: ALT 16; BUN 18; Creatinine, Ser 0.73; Hemoglobin 14.2; Platelets 480.0; Potassium 4.2; Sodium 139  Recent Lipid Panel    Component Value Date/Time   CHOL 137 05/15/2022 0849   CHOL 138 02/23/2021 1531   TRIG 105.0 05/15/2022 0849   HDL 40.30 05/15/2022 0849   HDL 41 02/23/2021 1531   CHOLHDL 3 05/15/2022 0849   VLDL 21.0 05/15/2022 0849   LDLCALC 76 05/15/2022 0849   LDLCALC 74 02/23/2021 1531   LDLCALC 40 01/21/2020 1505   LDLDIRECT 89.0 11/13/2021 1131    Physical Exam:    VS:  BP 136/76 (BP Location: Left Arm, Patient Position: Sitting, Cuff Size: Normal)   Pulse (!) 103   Ht 5\' 5"  (1.651 m)   Wt 173 lb 12.8 oz (78.8 kg)   SpO2 99%   BMI 28.92 kg/m     Wt Readings from Last 3 Encounters:  08/30/22 173 lb 12.8 oz (78.8 kg)  07/19/22 169 lb 9.6 oz (76.9 kg)  05/15/22 173 lb 3.2 oz (78.6 kg)     GEN:  Well nourished, well developed in no acute distress HEENT: Normal NECK: No JVD; No carotid bruits CARDIAC: Tachycardic, regular,  no murmurs, rubs, gallops RESPIRATORY:  Clear to auscultation without rales, wheezing or rhonchi  ABDOMEN: Soft, non-tender, non-distended MUSCULOSKELETAL:  No edema; No deformity  SKIN: Warm and dry NEUROLOGIC:  Alert and oriented x 3 PSYCHIATRIC:  Normal affect   ASSESSMENT:    1. Orthostatic hypotension   2. Inappropriate sinus tachycardia   3. Mixed hyperlipidemia    PLAN:    In order of problems listed above:  Orthostatic hypotension, symptoms improved with midodrine although still present.  BP today normal.  Patient on midodrine 10 mg twice daily, patient will call to confirm his current dosage as written in bottle, since it's not listed on his medication list. Inappropriate sinus tachycardia, improved with digoxin.  Continue digoxin 0.125 daily.  Advised to cut back on caffeinated drinks/sodas. Hyperlipidemia, cholesterol controlled, continue Lipitor  Follow-up 6 months.  Medication Adjustments/Labs and Tests Ordered: Current medicines are reviewed at length with the patient today.  Concerns regarding medicines are outlined above.  Orders Placed This Encounter  Procedures   EKG 12-Lead    No orders of the defined types were placed in this encounter.    Patient Instructions  Medication Instructions:   Call yo confirm how you are taking midodrine.  It should be 10 mg by mouth twice a day.   *If you need a refill on your cardiac medications before your next appointment, please call your pharmacy*   Lab Work:  None Ordered  If you have labs (blood work) drawn today and your tests are completely normal, you will receive your results only by: MyChart Message (if you have MyChart) OR A paper copy in the mail If you have any lab test that is abnormal or we need to change your treatment, we will call you to review the results.   Testing/Procedures:  None Ordered   Follow-Up: At [facility], you and your health needs are our priority.  As part of our  continuing mission to provide you with exceptional heart care, we have created designated Provider Care Teams.  These Care Teams include your primary Cardiologist (physician) and Advanced Practice Providers (APPs -  Physician Assistants and Nurse Practitioners) who all work  together to provide you with the care you need, when you need it.  We recommend signing up for the patient portal called "MyChart".  Sign up information is provided on this After Visit Summary.  MyChart is used to connect with patients for Virtual Visits (Telemedicine).  Patients are able to view lab/test results, encounter notes, upcoming appointments, etc.  Non-urgent messages can be sent to your provider as well.   To learn more about what you can do with MyChart, go to [email or web address].    Your next appointment:   6 month(s)  Provider:   You may see Debbe Odea, MD or one of the following Advanced Practice Providers on your designated Care Team:   Nicolasa Ducking, NP Eula Listen, PA-C Cadence Fransico Michael, PA-C Charlsie Quest, NP    Signed, Debbe Odea, MD  08/30/2022 11:19 AM    [facility]

## 2022-09-13 ENCOUNTER — Ambulatory Visit: Payer: 59 | Attending: Cardiology

## 2022-09-13 ENCOUNTER — Encounter: Payer: Self-pay | Admitting: Cardiology

## 2022-09-13 DIAGNOSIS — I4711 Inappropriate sinus tachycardia, so stated: Secondary | ICD-10-CM

## 2022-09-16 DIAGNOSIS — I4711 Inappropriate sinus tachycardia, so stated: Secondary | ICD-10-CM

## 2022-09-25 ENCOUNTER — Other Ambulatory Visit: Payer: Self-pay | Admitting: Family

## 2022-09-25 DIAGNOSIS — E119 Type 2 diabetes mellitus without complications: Secondary | ICD-10-CM

## 2022-10-07 ENCOUNTER — Other Ambulatory Visit: Payer: Self-pay | Admitting: Family

## 2022-10-09 ENCOUNTER — Encounter (INDEPENDENT_AMBULATORY_CARE_PROVIDER_SITE_OTHER): Payer: Self-pay

## 2022-10-10 DIAGNOSIS — I4711 Inappropriate sinus tachycardia, so stated: Secondary | ICD-10-CM | POA: Diagnosis not present

## 2022-10-28 ENCOUNTER — Ambulatory Visit (INDEPENDENT_AMBULATORY_CARE_PROVIDER_SITE_OTHER): Payer: 59 | Admitting: Pulmonary Disease

## 2022-10-28 ENCOUNTER — Encounter: Payer: Self-pay | Admitting: Pulmonary Disease

## 2022-10-28 VITALS — BP 122/80 | HR 98 | Temp 97.1°F | Ht 65.0 in | Wt 176.8 lb

## 2022-10-28 DIAGNOSIS — J432 Centrilobular emphysema: Secondary | ICD-10-CM | POA: Diagnosis not present

## 2022-10-28 DIAGNOSIS — Z87891 Personal history of nicotine dependence: Secondary | ICD-10-CM

## 2022-10-28 DIAGNOSIS — J449 Chronic obstructive pulmonary disease, unspecified: Secondary | ICD-10-CM

## 2022-10-28 NOTE — Patient Instructions (Signed)
Your lung cancer screening CT looked good this year.  You will be due again for another one  in February.  They will call you closer to that time to schedule it.  We will see him in follow-up in a years time call sooner should any new problems arise.

## 2022-10-28 NOTE — Progress Notes (Signed)
Subjective:    Patient ID: Benjamin Conley, male    DOB: 1965-11-04, 57 y.o.   MRN: [id number]  Patient Care Team: Allegra Grana, [name] as PCP - General (Family Medicine) Debbe Odea, MD as PCP - Cardiology (Cardiology) Jeralyn Ruths, MD as Consulting Physician (Oncology)  Chief Complaint  Patient presents with   Follow-up    DOE. No wheezing. Dry cough.     HPI The patient is a 57 year old former smoker (96-pack-year history) who presents for follow-up on the issue of pulmonary emphysema. He was last seen here 10 October 2021 at that time he was doing well.  We requested pulmonary function testing to establish a baseline with regards to his pulmonary emphysema.  Previously, he was given a trial of Stiolto 2 puffs daily however he did not find that this was helpful in any way.  He has had chronic dyspnea of longstanding with heavy exertion and bending over. However this has now resolved since he quit smoking.  He states that his breathing does not limit his activity which is already limited due to his need to use a cane for ambulation.  He requires a cane with ambulation due to dizziness and unsteadiness.  He had lung cancer screening program CT on 02 May 2022 this was a lung RADS 2, he will have follow-up CT February 2025.  He does have mild emphysema on CT.  He quit using all inhalers including albuterol states he does not need them.  He does not endorse any symptomatology today.  He has not had any cough or hemoptysis.  He has not had any orthopnea, paroxysmal nocturnal dyspnea, fevers, chills or sweats.  No cough or sputum production.  Overall he feels that he is doing well.  DATA 09/13/2021 PFTs:FEV1 of 2.71 L or 89% predicted, FVC of 3.62 L or 91% predicted, FEV1/FVC of 75%, no significant bronchodilator response. Lung volumes were normal. Diffusion capacity is mildly impaired. Testing is consistent with diagnosis of emphysema, very mild in nature. 05/02/2022 LDCT  lung: Lung RADS 2, benign appearance or behavior.  Mild centrilobular emphysema.  Calcified 8.8 mm nodule in the posterior left lower lobe consistent with a granuloma.  Additional scattered tiny bilateral pulmonary nodules evident.  No suspicious pulmonary nodule or mass.  Review of Systems A 10 point review of systems was performed and it is as noted above otherwise negative.   Past Medical History:  Diagnosis Date   Acute posthemorrhagic anemia    Complication of anesthesia    c/o difficulty breathing after anesthesia   COPD (chronic obstructive pulmonary disease) (HCC)    Diabetes mellitus without complication (HCC)    Hyperlipemia    Melena 08/21/2019   Paroxysmal supraventricular tachycardia 08/19/2019   Postural dizziness with presyncope 08/21/2019   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   Tobacco abuse    Upper GI bleed 08/21/2019    Past Surgical History:  Procedure Laterality Date   BACK SURGERY     lumbar   COLONOSCOPY WITH PROPOFOL N/A 08/24/2019   Procedure: COLONOSCOPY WITH PROPOFOL;  Surgeon: Midge Minium, MD;  Location: [facility] [place] ENDOSCOPY;  Service: Endoscopy;  Laterality: N/A;   ESOPHAGOGASTRODUODENOSCOPY (EGD) WITH PROPOFOL N/A 08/24/2019   Procedure: ESOPHAGOGASTRODUODENOSCOPY (EGD) WITH PROPOFOL;  Surgeon: Midge Minium, MD;  Location: [facility] ENDOSCOPY;  Service: Endoscopy;  Laterality: N/A;   STENT PLACEMENT VASCULAR ([facility] HX)  07/2019   stenosis of distal splenic artery and stent placed   VISCERAL ANGIOGRAPHY N/A 08/06/2019   Procedure: VISCERAL ANGIOGRAPHY;  Surgeon: Renford Dills, MD;  Location: [facility] INVASIVE CV LAB;  Service: Cardiovascular;  Laterality: N/A;    Patient Active Problem List   Diagnosis Date Noted   PAD (peripheral artery disease) (HCC) 07/19/2022   Anemia 05/15/2022   Photophobia of both eyes 02/20/2022   Bilateral hand pain 08/13/2021   Dizziness 06/11/2021   Sinus congestion 06/11/2021   Pulmonary emphysema (HCC) 05/07/2021   GERD (gastroesophageal  reflux disease) 01/12/2021   Screening for blood or protein in urine 06/16/2020   Contact dermatitis 06/16/2020   Leukocytosis 01/30/2020   Nicotine use disorder 01/27/2020   Slow transit constipation 01/27/2020   Nail avulsion of toe, initial encounter 12/09/2019   Benign prostatic hyperplasia with hesitancy 10/30/2019   Abnormal ejaculation 10/21/2019   B12 deficiency 10/21/2019   Rectal pressure 10/21/2019   Thrombosis of splenic artery (HCC) 10/03/2019   Abnormal brain MRI 09/22/2019   Difficulty sleeping 09/22/2019   Tinnitus of both ears 09/22/2019   Pain due to onychomycosis of toenails of both feet 09/06/2019   Coagulation disorder (HCC) 09/06/2019   Iron deficiency anemia due to chronic blood loss 08/29/2019   Thrombocytosis 08/24/2019   Angiodysplasia of intestinal tract    Black stool 08/22/2019   Presence of arterial stent - splenic artery 08/21/2019   Orthostatic hypotension 08/19/2019   Tachycardia 08/19/2019   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   COPD with chronic bronchitis 08/01/2019   Tobacco abuse 08/01/2019   Diabetes mellitus without complication (HCC)    Hyperlipidemia    Impingement syndrome of shoulder region 03/30/2018   Hypertension 11/10/2013    Family History  Problem Relation Age of Onset   Diabetes Mother    Diabetes Mellitus II Mother    Tuberculosis Father    Heart disease Father    Diabetes Sister    Diabetes Sister    Diabetes Sister    Diabetes Sister    Diabetes Brother    Diabetes Mellitus II Brother    Hyperlipidemia Brother    Cancer Maternal Aunt    Colon cancer Maternal Uncle    Prostate cancer Neg Hx     Social History   Tobacco Use   Smoking status: Former    Current packs/day: 0.00    Average packs/day: 2.0 packs/day for 48.0 years (96.0 ttl pk-yrs)    Types: Cigarettes    Start date: 02/12/1972    Quit date: 02/12/2020    Years since quitting: 2.7   Smokeless tobacco: Never  Substance Use Topics   Alcohol use: No     Comment: Never been a problem    Allergies  Allergen Reactions   Wasp Venom Anaphylaxis   Wasp Venom Protein Anaphylaxis   Coffee Flavor Nausea And Vomiting    Patient states coffee gives him nausea and stomach cramps   Onion Nausea And Vomiting    Current Meds  Medication Sig   JARDIANCE 10 MG TABS tablet Take 10 mg by mouth every morning.    Immunization History  Administered Date(s) Administered   Td 03/11/2004   Tdap 08/02/2014      Objective:    BP 122/80 (BP Location: Left Arm, Cuff Size: Normal)   Pulse 98   Temp (!) 97.1 F (36.2 C)   Ht 5\' 5"  (1.651 m)   Wt 176 lb 12.8 oz (80.2 kg)   SpO2 99%   BMI 29.42 kg/m   SpO2: 99 % O2 Device: None (Room air)  GENERAL: Disheveled appearing gentleman, no acute distress, ambulatory with assistance  of a cane.  He looks much older than stated age.  No conversational dyspnea. HEAD: Normocephalic, atraumatic.  EYES: Pupils equal, round, reactive to light.  No scleral icterus.  MOUTH: Nose/mouth/throat not examined due to masking requirements for COVID 19. NECK: Supple. No thyromegaly. Trachea midline. No JVD.  No adenopathy. PULMONARY: Good air entry bilaterally.  Coarse, otherwise, no adventitious sounds. CARDIOVASCULAR: S1 and S2. Regular rate and rhythm.  No rubs, murmurs or gallops heard. ABDOMEN: Benign. MUSCULOSKELETAL: No joint deformity, no clubbing, no edema.  NEUROLOGIC: No overt focal deficit, gait is unsteady and requires cane for ambulation.  Speech is fluent though he does have some occasional word finding difficulty, states this is chronic. SKIN: Intact,warm,dry. PSYCH: Mood and behavior normal.   Assessment & Plan:     ICD-10-CM   1. Stage 1 mild COPD by GOLD classification (HCC)  J44.9    Asymptomatic Stopped using inhalers on his own    2. Centrilobular emphysema (HCC)  J43.2    Very mild in nature Patient asymptomatic    3. Former heavy cigarette smoker (20-39 per day)  Z87.891    No evidence  of relapse Continue yearly lung cancer screening     Patient appears to be doing well.  He has not felt that maintenance inhalers have helped him.  Does not use albuterol ever so he discontinued this.  Will see him on back in a year's time or as needed.  Continue the lung cancer screening program.  C. Danice Goltz, MD Advanced Bronchoscopy PCCM [facility]    *This note was dictated using voice recognition software/Dragon.  Despite best efforts to proofread, errors can occur which can change the meaning. Any transcriptional errors that result from this process are unintentional and may not be fully corrected at the time of dictation.

## 2022-10-29 ENCOUNTER — Ambulatory Visit (INDEPENDENT_AMBULATORY_CARE_PROVIDER_SITE_OTHER): Payer: 59 | Admitting: *Deleted

## 2022-10-29 VITALS — Ht 64.0 in | Wt 176.0 lb

## 2022-10-29 DIAGNOSIS — Z0000 Encounter for general adult medical examination without abnormal findings: Secondary | ICD-10-CM

## 2022-10-29 NOTE — Progress Notes (Signed)
Subjective:   Benjamin Conley is a 57 y.o. male who presents for Medicare Annual/Subsequent preventive examination.  Visit Complete: Virtual  I connected with  Benjamin Conley on 10/29/22 by a audio enabled telemedicine application and verified that I am speaking with the correct person using two identifiers.  Patient Location: Home  Provider Location: Office/Clinic  I discussed the limitations of evaluation and management by telemedicine. The patient expressed understanding and agreed to proceed.  Patient Medicare AWV questionnaire was completed by the patient on 10/25/22; I have confirmed that all information answered by patient is correct and no changes since this date.   Vital Signs: Unable to obtain new vitals due to this being a telehealth visit.   Review of Systems     Cardiac Risk Factors include: advanced age (>67men, >57 women);diabetes mellitus;dyslipidemia;male gender;hypertension;obesity (BMI >30kg/m2);sedentary lifestyle;Other (see comment), Risk factor comments: tachycardia     Objective:    Today's Vitals   10/29/22 1342  Weight: 176 lb (79.8 kg)  Height: 5\' 4"  (1.626 m)   Body mass index is 30.21 kg/m.     10/29/2022    2:02 PM 11/05/2021    1:28 PM 10/23/2021   12:42 PM 07/18/2021   12:50 PM 05/07/2021    1:04 PM 01/30/2021    1:35 PM 11/03/2020    1:03 PM  Advanced Directives  Does Patient Have a Medical Advance Directive? No Yes Yes Yes No Yes No  Type of Advance Directive  Living will;Healthcare Power of [employer] Power of [place];Living will Living will  Living will Out of facility DNR (pink MOST or yellow form)  Does patient want to make changes to medical advance directive?  No - Patient declined No - Patient declined No - Patient declined  No - Patient declined No - Patient declined  Copy of Healthcare Power of Attorney in Chart?  No - copy requested No - copy requested      Would patient like information on creating a medical  advance directive? No - Patient declined No - Patient declined   No - Patient declined  No - Patient declined    Current Medications (verified) Outpatient Encounter Medications as of 10/29/2022  Medication Sig   aspirin 81 MG EC tablet Take 1 tablet (81 mg total) by mouth daily.   atorvastatin (LIPITOR) 40 MG tablet Take 1 tablet (40 mg total) by mouth daily.   blood glucose meter kit and supplies Dispense based on patient and insurance preference. Use up to four times daily as directed. (FOR ICD-10 E10.9, E11.9).   clopidogrel (PLAVIX) 75 MG tablet Take 1 tablet (75 mg total) by mouth daily.   Continuous Glucose Sensor (FREESTYLE LIBRE 3 SENSOR) MISC USE ONE SENSOR ON THE SKIN EVERY 14 DAYS TO CHECK GLUCOSE CONTINUOUSLY   digoxin (LANOXIN) 0.125 MG tablet Take 1 tablet (0.125 mg total) by mouth daily.   JARDIANCE 10 MG TABS tablet Take 10 mg by mouth every morning.   midodrine (PROAMATINE) 5 MG tablet Take 2 tablets (10 mg total) by mouth in the morning.   nystatin-triamcinolone ointment (MYCOLOG) Apply 1 application topically 2 (two) times daily.   OZEMPIC, 2 MG/DOSE, 8 MG/3ML SOPN INJECT 2MG  SUBCUTANEOUSLY ONCE A WEEK   sildenafil (VIAGRA) 100 MG tablet Take 1 tablet (100 mg total) by mouth daily as needed for erectile dysfunction.   triamcinolone cream (KENALOG) 0.1 % Apply 1 application topically 2 (two) times daily.   No facility-administered encounter medications on file as of 10/29/2022.  Allergies (verified) Wasp venom, Wasp venom protein, Coffee flavor, and Onion   History: Past Medical History:  Diagnosis Date   Acute posthemorrhagic anemia    Complication of anesthesia    c/o difficulty breathing after anesthesia   COPD (chronic obstructive pulmonary disease) (HCC)    Diabetes mellitus without complication (HCC)    Hyperlipemia    Melena 08/21/2019   Paroxysmal supraventricular tachycardia 08/19/2019   Postural dizziness with presyncope 08/21/2019   Splenic infarction  07/2019   Tobacco abuse    Upper GI bleed 08/21/2019   Past Surgical History:  Procedure Laterality Date   BACK SURGERY     lumbar   COLONOSCOPY WITH PROPOFOL N/A 08/24/2019   Procedure: COLONOSCOPY WITH PROPOFOL;  Surgeon: Midge Minium, MD;  [facility]COPY;  Service: Endoscopy;  Laterality: N/A;   ESOPHAGOGASTRODUODENOSCOPY (EGD) WITH PROPOFOL N/A 08/24/2019   Procedure: ESOPHAGOGASTRODUODENOSCOPY (EGD) WITH PROPOFOL;  Surgeon: Midge Minium, MD;  Location: [facility] ENDOSCOPY;  Service: Endoscopy;  Laterality: N/A;   STENT PLACEMENT VASCULAR ([facility] HX)  07/2019   stenosis of distal splenic artery and stent placed   VISCERAL ANGIOGRAPHY N/A 08/06/2019   Procedure: VISCERAL ANGIOGRAPHY;  Surgeon: Renford Dills, MD;  Location: [facility] INVASIVE CV LAB;  Service: Cardiovascular;  Laterality: N/A;   Family History  Problem Relation Age of Onset   Diabetes Mother    Diabetes Mellitus II Mother    Tuberculosis Father    Heart disease Father    Diabetes Sister    Diabetes Sister    Diabetes Sister    Diabetes Sister    Diabetes Brother    Diabetes Mellitus II Brother    Hyperlipidemia Brother    Cancer Maternal Aunt    Colon cancer Maternal Uncle    Prostate cancer Neg Hx    Social History   Socioeconomic History   Marital status: Single    Spouse name: Not on file   Number of children: Not on file   Years of education: Not on file   Highest education level: Not on file  Occupational History   Not on file  Tobacco Use   Smoking status: Former    Current packs/day: 0.00    Average packs/day: 2.0 packs/day for 48.0 years (96.0 ttl pk-yrs)    Types: Cigarettes    Start date: 02/12/1972    Quit date: 02/12/2020    Years since quitting: 2.7   Smokeless tobacco: Never  Vaping Use   Vaping status: Never Used  Substance and Sexual Activity   Alcohol use: No    Comment: Never been a problem   Drug use: No   Sexual activity: Yes  Other Topics Concern   Not on file  Social  History Narrative   Single, lives alone, unemployed.   Social Determinants of Health   Financial Resource Strain: Medium Risk (10/25/2022)   Overall Financial Resource Strain (CARDIA)    Difficulty of Paying Living Expenses: Somewhat hard  Food Insecurity: Food Insecurity Present (10/25/2022)   Hunger Vital Sign    Worried About Running Out of Food in the Last Year: Sometimes true    Ran Out of Food in the Last Year: Sometimes true  Transportation Needs: No Transportation Needs (10/25/2022)   PRAPARE - Administrator, Civil Service (Medical): No    Lack of Transportation (Non-Medical): No  Physical Activity: Inactive (10/25/2022)   Exercise Vital Sign    Days of Exercise per Week: 0 days    Minutes of Exercise per  Session: 0 min  Stress: No Stress Concern Present (10/25/2022)   [employer] of Occupational Health - Occupational Stress Questionnaire    Feeling of Stress : Not at all  Social Connections: Unknown (10/25/2022)   Social Connection and Isolation Panel [NHANES]    Frequency of Communication with Friends and Family: Never    Frequency of Social Gatherings with Friends and Family: Never    Attends Religious Services: Not on Marketing executive or Organizations: No    Attends Banker Meetings: Never    Marital Status: Never married    Tobacco Counseling Counseling given: Not Answered   Clinical Intake:  Pre-visit preparation completed: Yes  Pain : No/denies pain     BMI - recorded: 30.21 Nutritional Status: BMI > 30  Obese Nutritional Risks: Non-healing wound (big toe on right foot) Diabetes: Yes CBG done?: Yes (BS 160 now) CBG resulted in Enter/ Edit results?: No Did pt. bring in CBG monitor from home?: No  How often do you need to have someone help you when you read instructions, pamphlets, or other written materials from your doctor or pharmacy?: 1 - Never  Interpreter Needed?: No  Information entered by :: R. Deangela Randleman  LPN   Activities of Daily Living    10/29/2022    1:47 PM 10/25/2022    9:45 AM  In your present state of health, do you have any difficulty performing the following activities:  Hearing? 1 0  Comment hearing aids   Vision? 0 0  Comment glasses   Difficulty concentrating or making decisions? 1 0  Comment remembering things   Walking or climbing stairs? 1 1  Comment uses  cane   Dressing or bathing? 0 0  Doing errands, shopping? 0 0  Preparing Food and eating ? N N  Using the Toilet? N N  In the past six months, have you accidently leaked urine? N Y  Do you have problems with loss of bowel control? N N  Managing your Medications? N N  Managing your Finances? N N  Housekeeping or managing your Housekeeping? N N    Patient Care Team: Allegra Grana, [name] as PCP - General (Family Medicine) Debbe Odea, MD as PCP - Cardiology (Cardiology) Jeralyn Ruths, MD as Consulting Physician (Oncology)  Indicate any recent Medical Services you may have received from other than [facility] providers in the past year (date may be approximate).     Assessment:   This is a routine wellness examination for Ovila.  Hearing/Vision screen Hearing Screening - Comments:: Wears aids Vision Screening - Comments:: glasses  Dietary issues and exercise activities discussed:     Goals Addressed             This Visit's Progress    Patient Stated       Continue to walk as much as he can       Depression Screen    10/29/2022    1:56 PM 05/15/2022    8:10 AM 02/13/2022   10:42 AM 12/04/2021    1:58 PM 11/13/2021   11:04 AM 11/13/2021   10:57 AM 10/23/2021   12:40 PM  PHQ 2/9 Scores  PHQ - 2 Score 0 0 0 0 0 0 0  PHQ- 9 Score 4          Fall Risk    10/25/2022    9:45 AM 05/15/2022    8:10 AM 02/13/2022   10:42 AM 12/04/2021  1:57 PM 11/13/2021   11:04 AM  Fall Risk   Falls in the past year? 1 0 0 0 0  Number falls in past yr: 0 0 0 0   Injury with Fall? 0 0 0 0   Risk for  fall due to : History of fall(s);Impaired balance/gait No Fall Risks No Fall Risks No Fall Risks No Fall Risks  Follow up Falls evaluation completed;Falls prevention discussed Falls evaluation completed Falls evaluation completed Falls evaluation completed Falls evaluation completed    MEDICARE RISK AT HOME: Medicare Risk at Home Any stairs in or around the home?: Yes If so, are there any without handrails?: No Home free of loose throw rugs in walkways, pet beds, electrical cords, etc?: Yes Adequate lighting in your home to reduce risk of falls?: Yes Life alert?: Yes Use of a cane, walker or w/c?: Yes Grab bars in the bathroom?: No Shower chair or bench in shower?: Yes Elevated toilet seat or a handicapped toilet?: No  Cognitive Function:        10/29/2022    2:02 PM 10/23/2021   12:44 PM 10/19/2020    9:59 AM 10/19/2019    8:49 AM  6CIT Screen  What Year? 0 points 0 points 0 points 0 points  What month? 0 points 0 points 0 points 0 points  What time? 3 points 0 points 0 points   Count back from 20 0 points  0 points   Months in reverse 4 points 0 points 4 points 4 points  Repeat phrase 2 points  2 points   Total Score 9 points  6 points     Immunizations Immunization History  Administered Date(s) Administered   Td 03/11/2004   Tdap 08/02/2014    TDAP status: Up to date  Flu Vaccine status: Declined, Education has been provided regarding the importance of this vaccine but patient still declined. Advised may receive this vaccine at local pharmacy or Health Dept. Aware to provide a copy of the vaccination record if obtained from local pharmacy or Health Dept. Verbalized acceptance and understanding.  Pneumococcal vaccine status: Declined,  Education has been provided regarding the importance of this vaccine but patient still declined. Advised may receive this vaccine at local pharmacy or Health Dept. Aware to provide a copy of the vaccination record if obtained from local  pharmacy or Health Dept. Verbalized acceptance and understanding.   Covid-19 vaccine status: Declined, Education has been provided regarding the importance of this vaccine but patient still declined. Advised may receive this vaccine at local pharmacy or Health Dept.or vaccine clinic. Aware to provide a copy of the vaccination record if obtained from local pharmacy or Health Dept. Verbalized acceptance and understanding.  Qualifies for Shingles Vaccine? Yes   Zostavax completed No   Shingrix Completed?: No.    Education has been provided regarding the importance of this vaccine. Patient has been advised to call insurance company to determine out of pocket expense if they have not yet received this vaccine. Advised may also receive vaccine at local pharmacy or Health Dept. Verbalized acceptance and understanding.  Screening Tests Health Maintenance  Topic Date Due   Medicare Annual Wellness (AWV)  10/24/2022   INFLUENZA VACCINE  10/10/2022   Diabetic kidney evaluation - Urine ACR  11/14/2022   HEMOGLOBIN A1C  11/15/2022   OPHTHALMOLOGY EXAM  03/06/2023   Lung Cancer Screening  05/03/2023   Diabetic kidney evaluation - eGFR measurement  05/15/2023   FOOT EXAM  05/15/2023   DTaP/Tdap/Td (  3 - Td or Tdap) 08/01/2024   Colonoscopy  08/23/2029   Hepatitis C Screening  Completed   HIV Screening  Completed   HPV VACCINES  Aged Out   COVID-19 Vaccine  Discontinued   Zoster Vaccines- Shingrix  Discontinued    Health Maintenance  Health Maintenance Due  Topic Date Due   Medicare Annual Wellness (AWV)  10/24/2022   INFLUENZA VACCINE  10/10/2022   Diabetic kidney evaluation - Urine ACR  11/14/2022    Colorectal cancer screening: Type of screening: Colonoscopy. Completed 6/21. Repeat every 10 years  Lung Cancer Screening: (Low Dose CT Chest recommended if Age 70-80 years, 20 pack-year currently smoking OR have quit w/in 15years.) does qualify.   Lung Cancer Screening Referral: order was  placed 05/03/22  Additional Screening:  Hepatitis C Screening: does qualify; Completed 3/24  Vision Screening: Recommended annual ophthalmology exams for early detection of glaucoma and other disorders of the eye. Is the patient up to date with their annual eye exam?  Yes  Who is the provider or what is the name of the office in which the patient attends annual eye exams? [facility] If pt is not established with a provider, would they like to be referred to a provider to establish care? No .   Dental Screening: Recommended annual dental exams for proper oral hygiene  Diabetic Foot Exam: Diabetic Foot Exam: Completed 3/24  Community Resource Referral / Chronic Care Management: CRR required this visit?  No   CCM required this visit?  No     Plan:     I have personally reviewed and noted the following in the patient's chart:   Medical and social history Use of alcohol, tobacco or illicit drugs  Current medications and supplements including opioid prescriptions. Patient is not currently taking opioid prescriptions. Functional ability and status Nutritional status Physical activity Advanced directives List of other physicians Hospitalizations, surgeries, and ER visits in previous 12 months Vitals Screenings to include cognitive, depression, and falls Referrals and appointments  In addition, I have reviewed and discussed with patient certain preventive protocols, quality metrics, and best practice recommendations. A written personalized care plan for preventive services as well as general preventive health recommendations were provided to patient.     Sydell Axon, LPN   [id number]   After Visit Summary: (MyChart) Due to this being a telephonic visit, the after visit summary with patients personalized plan was offered to patient via MyChart   Nurse Notes: Patient has a sore on his big toe on the right foot for 2 weeks. Patient scheduled an appointment with Dr. Birdie Sons  10/30/22 .Patient denies a fever.

## 2022-10-29 NOTE — Patient Instructions (Signed)
[name] , Thank you for taking time to come for your Medicare Wellness Visit. I appreciate your ongoing commitment to your health goals. Please review the following plan we discussed and let me know if I can assist you in the future.   Referrals/Orders/Follow-Ups/Clinician Recommendations: None  This is a list of the screening recommended for you and due dates:  Health Maintenance  Topic Date Due   Flu Shot  10/10/2022   Yearly kidney health urinalysis for diabetes  11/14/2022   Hemoglobin A1C  11/15/2022   Eye exam for diabetics  03/06/2023   Screening for Lung Cancer  05/03/2023   Yearly kidney function blood test for diabetes  05/15/2023   Complete foot exam   05/15/2023   Medicare Annual Wellness Visit  10/29/2023   DTaP/Tdap/Td vaccine (3 - Td or Tdap) 08/01/2024   Colon Cancer Screening  08/23/2029   Hepatitis C Screening  Completed   HIV Screening  Completed   HPV Vaccine  Aged Out   COVID-19 Vaccine  Discontinued   Zoster (Shingles) Vaccine  Discontinued    Advanced directives: (Declined) Advance directive discussed with you today. Even though you declined this today, please call our office should you change your mind, and we can give you the proper paperwork for you to fill out.  Next Medicare Annual Wellness Visit scheduled for next year: Yes  10/31/23 @ 10:30 Preventive Care 40-64 Years, Male Preventive care refers to lifestyle choices and visits with your health care provider that can promote health and wellness. What does preventive care include? A yearly physical exam. This is also called an annual well check. Dental exams once or twice a year. Routine eye exams. Ask your health care provider how often you should have your eyes checked. Personal lifestyle choices, including: Daily care of your teeth and gums. Regular physical activity. Eating a healthy diet. Avoiding tobacco and drug use. Limiting alcohol use. Practicing safe sex. Taking low-dose aspirin  every day starting at age 79. What happens during an annual well check? The services and screenings done by your health care provider during your annual well check will depend on your age, overall health, lifestyle risk factors, and family history of disease. Counseling  Your health care provider may ask you questions about your: Alcohol use. Tobacco use. Drug use. Emotional well-being. Home and relationship well-being. Sexual activity. Eating habits. Work and work Astronomer. Screening  You may have the following tests or measurements: Height, weight, and BMI. Blood pressure. Lipid and cholesterol levels. These may be checked every 5 years, or more frequently if you are over 41 years old. Skin check. Lung cancer screening. You may have this screening every year starting at age 73 if you have a 30-pack-year history of smoking and currently smoke or have quit within the past 15 years. Fecal occult blood test (FOBT) of the stool. You may have this test every year starting at age 80. Flexible sigmoidoscopy or colonoscopy. You may have a sigmoidoscopy every 5 years or a colonoscopy every 10 years starting at age 9. Prostate cancer screening. Recommendations will vary depending on your family history and other risks. Hepatitis C blood test. Hepatitis B blood test. Sexually transmitted disease (STD) testing. Diabetes screening. This is done by checking your blood sugar (glucose) after you have not eaten for a while (fasting). You may have this done every 1-3 years. Discuss your test results, treatment options, and if necessary, the need for more tests with your health care provider. Vaccines  Your health care provider may recommend certain vaccines, such as: Influenza vaccine. This is recommended every year. Tetanus, diphtheria, and acellular pertussis (Tdap, Td) vaccine. You may need a Td booster every 10 years. Zoster vaccine. You may need this after age 46. Pneumococcal 13-valent  conjugate (PCV13) vaccine. You may need this if you have certain conditions and have not been vaccinated. Pneumococcal polysaccharide (PPSV23) vaccine. You may need one or two doses if you smoke cigarettes or if you have certain conditions. Talk to your health care provider about which screenings and vaccines you need and how often you need them. This information is not intended to replace advice given to you by your health care provider. Make sure you discuss any questions you have with your health care provider. Document Released: 03/24/2015 Document Revised: 11/15/2015 Document Reviewed: 12/27/2014 Elsevier Interactive Patient Education  2017 [employer].  Fall Prevention in the Home Falls can cause injuries. They can happen to people of all ages. There are many things you can do to make your home safe and to help prevent falls. What can I do on the outside of my home? Regularly fix the edges of walkways and driveways and fix any cracks. Remove anything that might make you trip as you walk through a door, such as a raised step or threshold. Trim any bushes or trees on the path to your home. Use bright outdoor lighting. Clear any walking paths of anything that might make someone trip, such as rocks or tools. Regularly check to see if handrails are loose or broken. Make sure that both sides of any steps have handrails. Any raised decks and porches should have guardrails on the edges. Have any leaves, snow, or ice cleared regularly. Use sand or salt on walking paths during winter. Clean up any spills in your garage right away. This includes oil or grease spills. What can I do in the bathroom? Use night lights. Install grab bars by the toilet and in the tub and shower. Do not use towel bars as grab bars. Use non-skid mats or decals in the tub or shower. If you need to sit down in the shower, use a plastic, non-slip stool. Keep the floor dry. Clean up any water that spills on the floor as  soon as it happens. Remove soap buildup in the tub or shower regularly. Attach bath mats securely with double-sided non-slip rug tape. Do not have throw rugs and other things on the floor that can make you trip. What can I do in the bedroom? Use night lights. Make sure that you have a light by your bed that is easy to reach. Do not use any sheets or blankets that are too big for your bed. They should not hang down onto the floor. Have a firm chair that has side arms. You can use this for support while you get dressed. Do not have throw rugs and other things on the floor that can make you trip. What can I do in the kitchen? Clean up any spills right away. Avoid walking on wet floors. Keep items that you use a lot in easy-to-reach places. If you need to reach something above you, use a strong step stool that has a grab bar. Keep electrical cords out of the way. Do not use floor polish or wax that makes floors slippery. If you must use wax, use non-skid floor wax. Do not have throw rugs and other things on the floor that can make you trip. What can I  do with my stairs? Do not leave any items on the stairs. Make sure that there are handrails on both sides of the stairs and use them. Fix handrails that are broken or loose. Make sure that handrails are as long as the stairways. Check any carpeting to make sure that it is firmly attached to the stairs. Fix any carpet that is loose or worn. Avoid having throw rugs at the top or bottom of the stairs. If you do have throw rugs, attach them to the floor with carpet tape. Make sure that you have a light switch at the top of the stairs and the bottom of the stairs. If you do not have them, ask someone to add them for you. What else can I do to help prevent falls? Wear shoes that: Do not have high heels. Have rubber bottoms. Are comfortable and fit you well. Are closed at the toe. Do not wear sandals. If you use a stepladder: Make sure that it is  fully opened. Do not climb a closed stepladder. Make sure that both sides of the stepladder are locked into place. Ask someone to hold it for you, if possible. Clearly mark and make sure that you can see: Any grab bars or handrails. First and last steps. Where the edge of each step is. Use tools that help you move around (mobility aids) if they are needed. These include: Canes. Walkers. Scooters. Crutches. Turn on the lights when you go into a dark area. Replace any light bulbs as soon as they burn out. Set up your furniture so you have a clear path. Avoid moving your furniture around. If any of your floors are uneven, fix them. If there are any pets around you, be aware of where they are. Review your medicines with your doctor. Some medicines can make you feel dizzy. This can increase your chance of falling. Ask your doctor what other things that you can do to help prevent falls. This information is not intended to replace advice given to you by your health care provider. Make sure you discuss any questions you have with your health care provider. Document Released: 12/22/2008 Document Revised: 08/03/2015 Document Reviewed: 04/01/2014 Elsevier Interactive Patient Education  2017 [employer].

## 2022-10-30 ENCOUNTER — Encounter: Payer: Self-pay | Admitting: Family Medicine

## 2022-10-30 ENCOUNTER — Ambulatory Visit (INDEPENDENT_AMBULATORY_CARE_PROVIDER_SITE_OTHER): Payer: 59 | Admitting: Family Medicine

## 2022-10-30 VITALS — BP 120/74 | HR 107 | Temp 97.5°F | Ht 64.0 in | Wt 175.0 lb

## 2022-10-30 DIAGNOSIS — S91109A Unspecified open wound of unspecified toe(s) without damage to nail, initial encounter: Secondary | ICD-10-CM

## 2022-10-30 NOTE — Assessment & Plan Note (Signed)
Appears to progressively be improving.  Discussed use of Vaseline on this and keeping it covered with a bandage if he is going to be in bare feet or in sandals.  Advised to monitor.  Suspect this will improve over the next week or so.  If not resolving or if he develops signs of infection he will let us know right away.

## 2022-10-30 NOTE — Progress Notes (Signed)
Marikay Alar, MD Phone: [phone]  Benjamin Conley is a 57 y.o. male who presents today for same-day visit.  Right great toe wound: Patient notes this has been present for couple weeks.  Its on the dorsum of the right great toe.  It is improving progressively.  Possibly started after getting bitten by ants.  There is no pain, drainage, or fever.  Patient reports it is smaller than it was previously.  Patient has been using triple antibiotic ointment on it.  Social History   Tobacco Use  Smoking Status Former   Current packs/day: 0.00   Average packs/day: 2.0 packs/day for 48.0 years (96.0 ttl pk-yrs)   Types: Cigarettes   Start date: 02/12/1972   Quit date: 02/12/2020   Years since quitting: 2.7  Smokeless Tobacco Never    Current Outpatient Medications on File Prior to Visit  Medication Sig Dispense Refill   aspirin 81 MG EC tablet Take 1 tablet (81 mg total) by mouth daily. 90 tablet 3   atorvastatin (LIPITOR) 40 MG tablet Take 1 tablet (40 mg total) by mouth daily. 90 tablet 3   blood glucose meter kit and supplies Dispense based on patient and insurance preference. Use up to four times daily as directed. (FOR ICD-10 E10.9, E11.9). 1 each 0   clopidogrel (PLAVIX) 75 MG tablet Take 1 tablet (75 mg total) by mouth daily. 90 tablet 3   Continuous Glucose Sensor (FREESTYLE LIBRE 3 SENSOR) MISC USE ONE SENSOR ON THE SKIN EVERY 14 DAYS TO CHECK GLUCOSE CONTINUOUSLY 2 each 0   digoxin (LANOXIN) 0.125 MG tablet Take 1 tablet (0.125 mg total) by mouth daily. 30 tablet 3   JARDIANCE 10 MG TABS tablet Take 10 mg by mouth every morning.     midodrine (PROAMATINE) 5 MG tablet Take 2 tablets (10 mg total) by mouth in the morning. 180 tablet 0   nystatin-triamcinolone ointment (MYCOLOG) Apply 1 application topically 2 (two) times daily. 60 g 0   OZEMPIC, 2 MG/DOSE, 8 MG/3ML SOPN INJECT 2MG  SUBCUTANEOUSLY ONCE A WEEK 9 mL 0   sildenafil (VIAGRA) 100 MG tablet Take 1 tablet (100 mg total)  by mouth daily as needed for erectile dysfunction. 30 tablet 0   triamcinolone cream (KENALOG) 0.1 % Apply 1 application topically 2 (two) times daily. 30 g 0   No current facility-administered medications on file prior to visit.     ROS see history of present illness  Objective  Physical Exam Vitals:   10/30/22 1454  BP: 120/74  Pulse: (!) 107  Temp: (!) 97.5 F (36.4 C)  SpO2: 97%    BP Readings from Last 3 Encounters:  10/30/22 120/74  10/28/22 122/80  08/30/22 136/76   Wt Readings from Last 3 Encounters:  10/30/22 175 lb (79.4 kg)  10/29/22 176 lb (79.8 kg)  10/28/22 176 lb 12.8 oz (80.2 kg)    Physical Exam Skin:    Comments: Small superficial wound/ulceration on the dorsum of his right great toe.  There is no surrounding erythema, no tenderness, no drainage      Assessment/Plan: Please see individual problem list.  Open wound of toe, initial encounter Assessment & Plan: Appears to progressively be improving.  Discussed use of Vaseline on this and keeping it covered with a bandage if he is going to be in bare feet or in sandals.  Advised to monitor.  Suspect this will improve over the next week or so.  If not resolving or if he develops signs of  infection he will let us know right away.     No follow-ups on file.   Marikay Alar, MD [facility]

## 2022-11-02 ENCOUNTER — Telehealth: Payer: Self-pay | Admitting: Family

## 2022-11-27 ENCOUNTER — Encounter: Payer: Self-pay | Admitting: Family

## 2022-11-27 NOTE — Telephone Encounter (Signed)
Pt scheduled for [facility] 12/02/22

## 2022-12-02 ENCOUNTER — Encounter: Payer: Self-pay | Admitting: Family

## 2022-12-02 ENCOUNTER — Ambulatory Visit: Payer: 59 | Admitting: Family

## 2022-12-02 ENCOUNTER — Other Ambulatory Visit: Payer: Self-pay

## 2022-12-02 ENCOUNTER — Other Ambulatory Visit: Payer: Self-pay | Admitting: Family

## 2022-12-02 VITALS — BP 128/70 | HR 103 | Temp 97.8°F | Ht 64.0 in | Wt 174.8 lb

## 2022-12-02 DIAGNOSIS — Z7985 Long-term (current) use of injectable non-insulin antidiabetic drugs: Secondary | ICD-10-CM

## 2022-12-02 DIAGNOSIS — R000 Tachycardia, unspecified: Secondary | ICD-10-CM

## 2022-12-02 DIAGNOSIS — R7309 Other abnormal glucose: Secondary | ICD-10-CM

## 2022-12-02 DIAGNOSIS — E119 Type 2 diabetes mellitus without complications: Secondary | ICD-10-CM

## 2022-12-02 LAB — POCT GLYCOSYLATED HEMOGLOBIN (HGB A1C): Hemoglobin A1C: 7.6 % — AB (ref 4.0–5.6)

## 2022-12-02 MED ORDER — EMPAGLIFLOZIN 25 MG PO TABS
25.0000 mg | ORAL_TABLET | Freq: Every day | ORAL | 3 refills | Status: AC
Start: 2022-12-02 — End: ?

## 2022-12-02 MED ORDER — FREESTYLE LIBRE 3 SENSOR MISC: 0 refills | Status: DC

## 2022-12-02 NOTE — Telephone Encounter (Signed)
Pt called stating he need a refill on libre 3 but [facility] is out of stock. Pt want to know what is a recommendation

## 2022-12-02 NOTE — Telephone Encounter (Signed)
Spoke to pt and he wanted me to try  sending to [facility]. Refill sent

## 2022-12-02 NOTE — Assessment & Plan Note (Addendum)
Reviewed recent Holter monitor.  Average heart rate of 101. HR improved while resting in the room.  Patient is drinking significant amount of caffeinated soda.  Certainly question if this is aggravating underlying resting heart rate.  Counseled on reduction of soda and increase of water.  Fortunately he is asymptomatic.  EKG shows NSR, HR 98, no acute changes when compared to prior 08/30/22.  Pending labs today. Will monitor after caffeine reduction.

## 2022-12-09 NOTE — Assessment & Plan Note (Addendum)
Lab Results  Component Value Date   HGBA1C 7.6 (A) 12/02/2022   Uncontrolled, increase Jardiance to 25 mg daily.  Continue Ozempic 2 mg daily

## 2022-12-16 ENCOUNTER — Other Ambulatory Visit (INDEPENDENT_AMBULATORY_CARE_PROVIDER_SITE_OTHER): Payer: 59

## 2022-12-16 DIAGNOSIS — R000 Tachycardia, unspecified: Secondary | ICD-10-CM

## 2022-12-16 DIAGNOSIS — E119 Type 2 diabetes mellitus without complications: Secondary | ICD-10-CM | POA: Diagnosis not present

## 2022-12-16 LAB — TSH: TSH: 1.37 u[IU]/mL (ref 0.35–5.50)

## 2022-12-16 LAB — CBC WITH DIFFERENTIAL/PLATELET
Basophils Absolute: 0.1 10*3/uL (ref 0.0–0.1)
Basophils Relative: 0.9 % (ref 0.0–3.0)
Eosinophils Absolute: 0.1 10*3/uL (ref 0.0–0.7)
Eosinophils Relative: 0.7 % (ref 0.0–5.0)
HCT: 42.7 % (ref 39.0–52.0)
Hemoglobin: 13.4 g/dL (ref 13.0–17.0)
Lymphocytes Relative: 33.8 % (ref 12.0–46.0)
Lymphs Abs: 2.7 10*3/uL (ref 0.7–4.0)
MCHC: 31.4 g/dL (ref 30.0–36.0)
MCV: 85.3 fL (ref 78.0–100.0)
Monocytes Absolute: 0.7 10*3/uL (ref 0.1–1.0)
Monocytes Relative: 8.9 % (ref 3.0–12.0)
Neutro Abs: 4.4 10*3/uL (ref 1.4–7.7)
Neutrophils Relative %: 55.7 % (ref 43.0–77.0)
Platelets: 440 10*3/uL — ABNORMAL HIGH (ref 150.0–400.0)
RBC: 5.01 Mil/uL (ref 4.22–5.81)
RDW: 16.5 % — ABNORMAL HIGH (ref 11.5–15.5)
WBC: 7.9 10*3/uL (ref 4.0–10.5)

## 2022-12-16 LAB — COMPREHENSIVE METABOLIC PANEL
ALT: 14 U/L (ref 0–53)
AST: 14 U/L (ref 0–37)
Albumin: 4.1 g/dL (ref 3.5–5.2)
Alkaline Phosphatase: 70 U/L (ref 39–117)
BUN: 11 mg/dL (ref 6–23)
CO2: 28 meq/L (ref 19–32)
Calcium: 9.4 mg/dL (ref 8.4–10.5)
Chloride: 105 meq/L (ref 96–112)
Creatinine, Ser: 0.7 mg/dL (ref 0.40–1.50)
GFR: 102.54 mL/min (ref >60.00)
Glucose, Bld: 130 mg/dL — ABNORMAL HIGH (ref 70–99)
Potassium: 3.9 meq/L (ref 3.5–5.1)
Sodium: 142 meq/L (ref 135–145)
Total Bilirubin: 0.4 mg/dL (ref 0.2–1.2)
Total Protein: 6.7 g/dL (ref 6.0–8.3)

## 2022-12-16 LAB — MICROALBUMIN / CREATININE URINE RATIO
Creatinine,U: 102 mg/dL
Microalb Creat Ratio: 3.5 mg/g (ref 0.0–30.0)
Microalb, Ur: 3.6 mg/dL — ABNORMAL HIGH (ref 0.0–1.9)

## 2022-12-21 ENCOUNTER — Other Ambulatory Visit: Payer: Self-pay | Admitting: Family

## 2022-12-21 ENCOUNTER — Other Ambulatory Visit: Payer: Self-pay | Admitting: Cardiology

## 2022-12-21 DIAGNOSIS — E119 Type 2 diabetes mellitus without complications: Secondary | ICD-10-CM

## 2022-12-23 MED ORDER — MIDODRINE HCL 5 MG PO TABS: 10.0000 mg | ORAL_TABLET | Freq: Every morning | ORAL | 0 refills | Status: DC

## 2022-12-23 MED ORDER — FREESTYLE LIBRE 3 SENSOR MISC: 0 refills | Status: DC

## 2022-12-23 MED ORDER — OZEMPIC (2 MG/DOSE) 8 MG/3ML ~~LOC~~ SOPN: 2.0000 mg | PEN_INJECTOR | SUBCUTANEOUS | 0 refills | Status: DC

## 2023-01-15 ENCOUNTER — Encounter: Payer: Self-pay | Admitting: Oncology

## 2023-01-27 ENCOUNTER — Other Ambulatory Visit: Payer: Self-pay | Admitting: Family

## 2023-01-28 MED ORDER — FREESTYLE LIBRE 3 SENSOR MISC: 0 refills | Status: DC

## 2023-01-28 NOTE — Telephone Encounter (Signed)
Error

## 2023-01-31 ENCOUNTER — Other Ambulatory Visit: Payer: Self-pay | Admitting: *Deleted

## 2023-01-31 DIAGNOSIS — R972 Elevated prostate specific antigen [PSA]: Secondary | ICD-10-CM

## 2023-01-31 DIAGNOSIS — N3943 Post-void dribbling: Secondary | ICD-10-CM

## 2023-02-03 ENCOUNTER — Other Ambulatory Visit: Payer: 59

## 2023-02-03 DIAGNOSIS — N3943 Post-void dribbling: Secondary | ICD-10-CM

## 2023-02-03 DIAGNOSIS — R972 Elevated prostate specific antigen [PSA]: Secondary | ICD-10-CM

## 2023-02-04 LAB — PSA: Prostate Specific Ag, Serum: 4.3 ng/mL — ABNORMAL HIGH (ref 0.0–4.0)

## 2023-02-05 NOTE — Progress Notes (Signed)
02/11/2023 10:18 AM   Benjamin Conley Benjamin Conley 01-14-1966 [id number]  Referring provider: Allegra Grana, [name] [address] [place],  [place] [address]  Urological history: 1. ED -contributing factors of age, BPH, COPD, DM, HTN, HLD and smoking -sildenafil 100 mg, on-demand-dosing  2. BPH with LU TS -cysto 01/2020 prominent lateral lobe enlargement of prostate and moderate bladder neck elevation -prostate volume 47 grams -tamsulosin 0.4 mg daily - discontinued secondary to drop in BP  3. Ejaculatory disorder -retrograde ejaculation secondary to BPH and tamsulosin   4. Elevated PSA  -PSA (01/2023) 4.3 -prostate biopsy (07/2021) for PSA 4.1 - negative   Chief Complaint  Patient presents with   Benign Prostatic Hypertrophy    HPI: Benjamin Conley is a 57 y.o. male who presents today for follow up.    Previous records reviewed.   I PSS 10/3  He is having issues with postvoid dribbling, weak urinary stream and urinary hesitancy.  He had been on tamsulosin in the past, but it caused a drop in his blood pressure.  Patient denies any modifying or aggravating factors.  Patient denies any recent UTI's, gross hematuria, dysuria or suprapubic/flank pain.  Patient denies any fevers, chills, nausea or vomiting.     IPSS     Row Name 02/11/23 1000         International Prostate Symptom Score   How often have you had the sensation of not emptying your bladder? Less than 1 in 5     How often have you had to urinate less than every two hours? Not at All     How often have you found you stopped and started again several times when you urinated? About half the time     How often have you found it difficult to postpone urination? Not at All     How often have you had a weak urinary stream? About half the time     How often have you had to strain to start urination? Less than half the time     How many times did you typically get up at night to urinate? 1 Time     Total  IPSS Score 10       Quality of Life due to urinary symptoms   If you were to spend the rest of your life with your urinary condition just the way it is now how would you feel about that? Mixed                 Score:  1-7 Mild 8-19 Moderate 20-35 Severe   SHIM 6  He does not have a partner, so he has not engaged in sexual activity.  Patient is not having spontaneous erections.   He denies any pain or curvature with erections.     SHIM     Row Name 02/11/23 1011         SHIM: Over the last 6 months:   How do you rate your confidence that you could get and keep an erection? Very Low     When you had erections with sexual stimulation, how often were your erections hard enough for penetration (entering your partner)? A Few Times (much less than half the time)     During sexual intercourse, how often were you able to maintain your erection after you had penetrated (entered) your partner? Almost Never or Never     During sexual intercourse, how difficult was it to maintain your erection to  completion of intercourse? Extremely Difficult     When you attempted sexual intercourse, how often was it satisfactory for you? Almost Never or Never       SHIM Total Score   SHIM 6              Score: 1-7 Severe ED 8-11 Moderate ED 12-16 Mild-Moderate ED 17-21 Mild ED 22-25 No ED    PMH: Past Medical History:  Diagnosis Date   Acute posthemorrhagic anemia    Complication of anesthesia    c/o difficulty breathing after anesthesia   COPD (chronic obstructive pulmonary disease) (HCC)    Diabetes mellitus without complication (HCC)    Hyperlipemia    Melena 08/21/2019   Paroxysmal supraventricular tachycardia (HCC) 08/19/2019   Postural dizziness with presyncope 08/21/2019   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   Tobacco abuse    Upper GI bleed 08/21/2019    Surgical History: Past Surgical History:  Procedure Laterality Date   BACK SURGERY     lumbar   COLONOSCOPY WITH PROPOFOL  N/A 08/24/2019   Procedure: COLONOSCOPY WITH PROPOFOL;  Surgeon: Midge Minium, MD;  Location: [facility] ENDOSCOPY;  Service: Endoscopy;  Laterality: N/A;   ESOPHAGOGASTRODUODENOSCOPY (EGD) WITH PROPOFOL N/A 08/24/2019   Procedure: ESOPHAGOGASTRODUODENOSCOPY (EGD) WITH PROPOFOL;  Surgeon: Midge Minium, MD;  Location: [facility] ENDOSCOPY;  Service: Endoscopy;  Laterality: N/A;   STENT PLACEMENT VASCULAR ([facility] HX)  07/2019   stenosis of distal splenic artery and stent placed   VISCERAL ANGIOGRAPHY N/A 08/06/2019   Procedure: VISCERAL ANGIOGRAPHY;  Surgeon: Renford Dills, MD;  Location: [facility] INVASIVE CV LAB;  Service: Cardiovascular;  Laterality: N/A;    Home Medications:  Allergies as of 02/11/2023       Reactions   Wasp Venom Anaphylaxis   Wasp Venom Protein Anaphylaxis   Coffee Flavor Nausea And Vomiting   Patient states coffee gives him nausea and stomach cramps   Onion Nausea And Vomiting        Medication List        Accurate as of February 11, 2023 10:18 AM. If you have any questions, ask your nurse or doctor.          aspirin EC 81 MG tablet Take 1 tablet (81 mg total) by mouth daily.   atorvastatin 40 MG tablet Commonly known as: LIPITOR Take 1 tablet (40 mg total) by mouth daily.   blood glucose meter kit and supplies Dispense based on patient and insurance preference. Use up to four times daily as directed. (FOR ICD-10 E10.9, E11.9).   clopidogrel 75 MG tablet Commonly known as: PLAVIX Take 1 tablet (75 mg total) by mouth daily.   digoxin 0.125 MG tablet Commonly known as: Lanoxin Take 1 tablet (0.125 mg total) by mouth daily.   empagliflozin 25 MG Tabs tablet Commonly known as: Jardiance Take 1 tablet (25 mg total) by mouth daily before breakfast.   FreeStyle Libre 3 Sensor Misc USE ONE SENSOR EVERY 14 DAYS TO CHECK GLUCOSE CONTINOUSLY   midodrine 5 MG tablet Commonly known as: PROAMATINE Take 2 tablets (10 mg total) by mouth in the morning.    nystatin-triamcinolone ointment Commonly known as: MYCOLOG Apply 1 application topically 2 (two) times daily.   Ozempic (2 MG/DOSE) 8 MG/3ML Sopn Generic drug: Semaglutide (2 MG/DOSE) Inject 2 mg into the skin once a week.   sildenafil 100 MG tablet Commonly known as: VIAGRA Take 1 tablet (100 mg total) by mouth daily as needed for erectile dysfunction.   triamcinolone cream 0.1 %  Commonly known as: KENALOG Apply 1 application topically 2 (two) times daily.        Allergies:  Allergies  Allergen Reactions   Wasp Venom Anaphylaxis   Wasp Venom Protein Anaphylaxis   Coffee Flavor Nausea And Vomiting    Patient states coffee gives him nausea and stomach cramps   Onion Nausea And Vomiting    Family History: Family History  Problem Relation Age of Onset   Diabetes Mother    Diabetes Mellitus II Mother    Tuberculosis Father    Heart disease Father    Diabetes Sister    Diabetes Sister    Diabetes Sister    Diabetes Sister    Diabetes Brother    Diabetes Mellitus II Brother    Hyperlipidemia Brother    Cancer Maternal Aunt    Colon cancer Maternal Uncle    Prostate cancer Neg Hx     Social History:  reports that he quit smoking about 3 years ago. His smoking use included cigarettes. He started smoking about 51 years ago. He has a 96 pack-year smoking history. He has never used smokeless tobacco. He reports that he does not drink alcohol and does not use drugs.   Physical Exam: BP 131/87   Pulse (!) 108   Ht 5\' 6"  (1.676 m)   Wt 178 lb (80.7 kg)   BMI 28.73 kg/m   Constitutional:  Well nourished. Alert and oriented, No acute distress. HEENT: [place] AT, moist mucus membranes.  Trachea midline Cardiovascular: No clubbing, cyanosis, or edema. Respiratory: Normal respiratory effort, no increased work of breathing. GU: No CVA tenderness.  No bladder fullness or masses.  Patient with uncircumcised phallus.  Foreskin easily retracted  Urethral meatus is patent.  No  penile discharge. No penile lesions or rashes. Scrotum without lesions, cysts, rashes and/or edema.  Testicles are located scrotally bilaterally. No masses are appreciated in the testicles. Left and right epididymis are normal. Rectal: Patient with  normal sphincter tone. Anus and perineum without scarring or rashes. No rectal masses are appreciated. Prostate is approximately 50 + grams, 1 cm rubbery nodule is appreciated in the left apex.  Seminal vesicles could not be palpated.  Neurologic: Grossly intact, no focal deficits, moving all 4 extremities. Psychiatric: Normal mood and affect.   Laboratory data: Component     Latest Ref Rng 02/03/2023  Prostate Specific Ag, Serum     0.0 - 4.0 ng/mL 4.3 (H)     Legend: (H) High  Recent Results (from the past 2160 hour(s))  POCT HgB A1C     Status: Abnormal   Collection Time: 12/02/22 11:45 AM  Result Value Ref Range   Hemoglobin A1C 7.6 (A) 4.0 - 5.6 %   HbA1c POC (<> result, manual entry)     HbA1c, POC (prediabetic range)     HbA1c, POC (controlled diabetic range)    TSH     Status: None   Collection Time: 12/16/22  9:09 AM  Result Value Ref Range   TSH 1.37 0.35 - 5.50 uIU/mL  Microalbumin / creatinine urine ratio     Status: Abnormal   Collection Time: 12/16/22  9:09 AM  Result Value Ref Range   Microalb, Ur 3.6 (H) 0.0 - 1.9 mg/dL   Creatinine,U 132.4 mg/dL   Microalb Creat Ratio 3.5 0.0 - 30.0 mg/g  Comprehensive metabolic panel     Status: Abnormal   Collection Time: 12/16/22  9:09 AM  Result Value Ref Range   Sodium 142 135 -  145 mEq/L   Potassium 3.9 3.5 - 5.1 mEq/L   Chloride 105 96 - 112 mEq/L   CO2 28 19 - 32 mEq/L   Glucose, Bld 130 (H) 70 - 99 mg/dL   BUN 11 6 - 23 mg/dL   Creatinine, Ser 1.61 0.40 - 1.50 mg/dL   Total Bilirubin 0.4 0.2 - 1.2 mg/dL   Alkaline Phosphatase 70 39 - 117 U/L   AST 14 0 - 37 U/L   ALT 14 0 - 53 U/L   Total Protein 6.7 6.0 - 8.3 g/dL   Albumin 4.1 3.5 - 5.2 g/dL   GFR 096.04 >54.09  mL/min    Comment: Calculated using the CKD-EPI Creatinine Equation (2021)   Calcium 9.4 8.4 - 10.5 mg/dL  CBC with Differential/Platelet     Status: Abnormal   Collection Time: 12/16/22  9:09 AM  Result Value Ref Range   WBC 7.9 4.0 - 10.5 K/uL   RBC 5.01 4.22 - 5.81 Mil/uL   Hemoglobin 13.4 13.0 - 17.0 g/dL   HCT 81.1 91.4 - 78.2 %   MCV 85.3 78.0 - 100.0 fl   MCHC 31.4 30.0 - 36.0 g/dL   RDW 95.6 (H) 21.3 - 08.6 %   Platelets 440.0 (H) 150.0 - 400.0 K/uL   Neutrophils Relative % 55.7 43.0 - 77.0 %   Lymphocytes Relative 33.8 12.0 - 46.0 %   Monocytes Relative 8.9 3.0 - 12.0 %   Eosinophils Relative 0.7 0.0 - 5.0 %   Basophils Relative 0.9 0.0 - 3.0 %   Neutro Abs 4.4 1.4 - 7.7 K/uL   Lymphs Abs 2.7 0.7 - 4.0 K/uL   Monocytes Absolute 0.7 0.1 - 1.0 K/uL   Eosinophils Absolute 0.1 0.0 - 0.7 K/uL   Basophils Absolute 0.1 0.0 - 0.1 K/uL  PSA     Status: Abnormal   Collection Time: 02/03/23 10:04 AM  Result Value Ref Range   Prostate Specific Ag, Serum 4.3 (H) 0.0 - 4.0 ng/mL    Comment: Roche ECLIA methodology. According to the American Urological Association, Serum PSA should decrease and remain at undetectable levels after radical prostatectomy. The AUA defines biochemical recurrence as an initial PSA value 0.2 ng/mL or greater followed by a subsequent confirmatory PSA value 0.2 ng/mL or greater. Values obtained with different assay methods or kits cannot be used interchangeably. Results cannot be interpreted as absolute evidence of the presence or absence of malignant disease.   I have reviewed the labs.  Pertinent Imaging: N/A  Assessment & Plan:    1. BPH with LU TS -PSA stable -Bothersome symptoms of postvoid dribbling, urinary hesitancy and weak urinary stream -Will have a trial of Rapaflo 8 mg once daily  2. Prostate nodule -Explained that this has not been noted on any previous exams and that nodules can either be benign or cancerous.  We discussed going  forward with a prostate MRI for further stratification of this new finding on exam when, but he deferred and would like to follow-up in 6 months with a repeat PSA and DRE -I find this somewhat reasonable with a previous benign biopsy and a current PSA density of 0.091  3.  Erectile dysfunction -not sexually active   Michiel Cowboy, PA-C  [facility] [address] [place], [place] [address] [phone]

## 2023-02-11 ENCOUNTER — Ambulatory Visit: Payer: 59 | Admitting: Urology

## 2023-02-11 ENCOUNTER — Encounter: Payer: Self-pay | Admitting: Urology

## 2023-02-11 VITALS — BP 131/87 | HR 108 | Ht 66.0 in | Wt 178.0 lb

## 2023-02-11 DIAGNOSIS — N401 Enlarged prostate with lower urinary tract symptoms: Secondary | ICD-10-CM

## 2023-02-11 DIAGNOSIS — R3911 Hesitancy of micturition: Secondary | ICD-10-CM

## 2023-02-11 DIAGNOSIS — N402 Nodular prostate without lower urinary tract symptoms: Secondary | ICD-10-CM

## 2023-02-11 DIAGNOSIS — E1169 Type 2 diabetes mellitus with other specified complication: Secondary | ICD-10-CM | POA: Diagnosis not present

## 2023-02-11 DIAGNOSIS — N529 Male erectile dysfunction, unspecified: Secondary | ICD-10-CM

## 2023-02-11 DIAGNOSIS — N3943 Post-void dribbling: Secondary | ICD-10-CM

## 2023-02-11 DIAGNOSIS — N521 Erectile dysfunction due to diseases classified elsewhere: Secondary | ICD-10-CM

## 2023-02-11 DIAGNOSIS — R3912 Poor urinary stream: Secondary | ICD-10-CM | POA: Diagnosis not present

## 2023-02-11 MED ORDER — SILODOSIN 8 MG PO CAPS: 8.0000 mg | ORAL_CAPSULE | Freq: Every day | ORAL | 6 refills | Status: DC

## 2023-02-27 ENCOUNTER — Other Ambulatory Visit: Payer: Self-pay | Admitting: Cardiology

## 2023-02-28 MED ORDER — MIDODRINE HCL 5 MG PO TABS: 10.0000 mg | ORAL_TABLET | Freq: Every morning | ORAL | 0 refills | Status: DC

## 2023-02-28 NOTE — Telephone Encounter (Signed)
last office visit: 08/30/22, plan to f/u in 6 months  Next office visit:  03/10/23

## 2023-03-09 ENCOUNTER — Other Ambulatory Visit: Payer: Self-pay | Admitting: Acute Care

## 2023-03-09 DIAGNOSIS — Z87891 Personal history of nicotine dependence: Secondary | ICD-10-CM

## 2023-03-09 DIAGNOSIS — Z122 Encounter for screening for malignant neoplasm of respiratory organs: Secondary | ICD-10-CM

## 2023-03-10 ENCOUNTER — Encounter: Payer: Self-pay | Admitting: Cardiology

## 2023-03-10 ENCOUNTER — Ambulatory Visit: Payer: 59 | Attending: Cardiology | Admitting: Cardiology

## 2023-03-10 VITALS — BP 102/62 | HR 107 | Ht 65.0 in | Wt 172.8 lb

## 2023-03-10 DIAGNOSIS — I951 Orthostatic hypotension: Secondary | ICD-10-CM

## 2023-03-10 DIAGNOSIS — E782 Mixed hyperlipidemia: Secondary | ICD-10-CM | POA: Diagnosis not present

## 2023-03-10 DIAGNOSIS — I4711 Inappropriate sinus tachycardia, so stated: Secondary | ICD-10-CM | POA: Diagnosis not present

## 2023-03-10 MED ORDER — MIDODRINE HCL 10 MG PO TABS: 10.0000 mg | ORAL_TABLET | Freq: Two times a day (BID) | ORAL | 0 refills | Status: AC

## 2023-03-10 NOTE — Patient Instructions (Signed)
Medication Instructions:   INCREASE Midodrine - Take one tablet ( 10mg ) in the Morning and one tablet ( 10mg ) in the afternoon.   *If you need a refill on your cardiac medications before your next appointment, please call your pharmacy*   Lab Work:  None Ordered  If you have labs (blood work) drawn today and your tests are completely normal, you will receive your results only by: MyChart Message (if you have MyChart) OR A paper copy in the mail If you have any lab test that is abnormal or we need to change your treatment, we will call you to review the results.   Testing/Procedures:  None Ordered   Follow-Up: At [facility], you and your health needs are our priority.  As part of our continuing mission to provide you with exceptional heart care, we have created designated Provider Care Teams.  These Care Teams include your primary Cardiologist (physician) and Advanced Practice Providers (APPs -  Physician Assistants and Nurse Practitioners) who all work together to provide you with the care you need, when you need it.  We recommend signing up for the patient portal called "MyChart".  Sign up information is provided on this After Visit Summary.  MyChart is used to connect with patients for Virtual Visits (Telemedicine).  Patients are able to view lab/test results, encounter notes, upcoming appointments, etc.  Non-urgent messages can be sent to your provider as well.   To learn more about what you can do with MyChart, go to [email or web address].    Your next appointment:   6 month(s)  Provider:   You may see Debbe Odea, MD or one of the following Advanced Practice Providers on your designated Care Team:   Nicolasa Ducking, NP Eula Listen, PA-C Cadence Fransico Michael, PA-C Charlsie Quest, NP Carlos Levering, NP

## 2023-03-10 NOTE — Progress Notes (Signed)
Cardiology Office Note:    Date:  03/10/2023   ID:  Benjamin Conley, DOB 04-15-1965, MRN [id number]  PCP:  Allegra Grana, [name]  [facility] Cardiologist:  Debbe Odea, MD  [facility] Electrophysiologist:  None   Referring MD: Allegra Grana, [name]   Chief Complaint  Patient presents with   Follow-up    Patient denies new or acute cardiac problems/concerns today.     History of Present Illness:    Benjamin Conley is a 57 y.o. male with a hx of diabetes, orthostatic hypotension, inappropriate sinus tach, splenic infarct s/p stent placement 2021, COPD, hyperlipidemia, former smoker x40+ years who presents for follow-up.   Was given midodrine for orthostasis which has helped his symptoms.  Currently takes midodrine 10 mg in the morning.  He still gets dizzy when he rises up after leaning over, standing up too quickly from seated position.  Overall doing okay, no new concerns today.     Prior notes Echo 09/2019 normal systolic and diastolic function, EF 55 to 60%. Lexiscan Myoview 08/2019 low risk, no evidence for ischemia, no significant coronary artery calcification Cardiac monitor on May/2022, no significant arrhythmias.  Patient had worsening left upper quadrant abdominal pain.  He presented to the ED  where CT abdomen dated 07/2019 showed distal splenic aneurysm no greater than 6 mm, small dissection, likely source of splenic infarctions.  He was diagnosed with splenic infarct and had a stent placed to the splenic artery.    Denies palpitations or irregular heartbeats.  States his heart rates have been elevated over the past 2 to 3 weeks, up to 120 bpm upon checks at home.   Past Medical History:  Diagnosis Date   Acute posthemorrhagic anemia    Complication of anesthesia    c/o difficulty breathing after anesthesia   COPD (chronic obstructive pulmonary disease) (HCC)    Diabetes mellitus without complication (HCC)    Hyperlipemia    Melena 08/21/2019    Paroxysmal supraventricular tachycardia (HCC) 08/19/2019   Postural dizziness with presyncope 08/21/2019   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   Tobacco abuse    Upper GI bleed 08/21/2019    Past Surgical History:  Procedure Laterality Date   BACK SURGERY     lumbar   COLONOSCOPY WITH PROPOFOL N/A 08/24/2019   Procedure: COLONOSCOPY WITH PROPOFOL;  Surgeon: Midge Minium, MD;  Location: [facility] ENDOSCOPY;  Service: Endoscopy;  Laterality: N/A;   ESOPHAGOGASTRODUODENOSCOPY (EGD) WITH PROPOFOL N/A 08/24/2019   Procedure: ESOPHAGOGASTRODUODENOSCOPY (EGD) WITH PROPOFOL;  Surgeon: Midge Minium, MD;  Location: [facility] ENDOSCOPY;  Service: Endoscopy;  Laterality: N/A;   STENT PLACEMENT VASCULAR ([facility] HX)  07/2019   stenosis of distal splenic artery and stent placed   VISCERAL ANGIOGRAPHY N/A 08/06/2019   Procedure: VISCERAL ANGIOGRAPHY;  Surgeon: Renford Dills, MD;  Location: [facility] INVASIVE CV LAB;  Service: Cardiovascular;  Laterality: N/A;    Current Medications: Current Meds  Medication Sig   aspirin 81 MG EC tablet Take 1 tablet (81 mg total) by mouth daily.   atorvastatin (LIPITOR) 40 MG tablet Take 1 tablet (40 mg total) by mouth daily.   blood glucose meter kit and supplies Dispense based on patient and insurance preference. Use up to four times daily as directed. (FOR ICD-10 E10.9, E11.9).   clopidogrel (PLAVIX) 75 MG tablet Take 1 tablet (75 mg total) by mouth daily.   Continuous Glucose Sensor (FREESTYLE LIBRE 3 SENSOR) MISC USE ONE SENSOR EVERY 14 DAYS TO CHECK GLUCOSE CONTINOUSLY  digoxin (LANOXIN) 0.125 MG tablet Take 1 tablet (0.125 mg total) by mouth daily.   empagliflozin (JARDIANCE) 25 MG TABS tablet Take 1 tablet (25 mg total) by mouth daily before breakfast.   nystatin-triamcinolone ointment (MYCOLOG) Apply 1 application topically 2 (two) times daily.   Semaglutide, 2 MG/DOSE, (OZEMPIC, 2 MG/DOSE,) 8 MG/3ML SOPN Inject 2 mg into the skin once a week.   sildenafil (VIAGRA) 100 MG tablet  Take 1 tablet (100 mg total) by mouth daily as needed for erectile dysfunction.   silodosin (RAPAFLO) 8 MG CAPS capsule Take 1 capsule (8 mg total) by mouth daily with breakfast.   triamcinolone cream (KENALOG) 0.1 % Apply 1 application topically 2 (two) times daily.   [DISCONTINUED] midodrine (PROAMATINE) 5 MG tablet Take 2 tablets (10 mg total) by mouth in the morning.     Allergies:   Wasp venom, Wasp venom protein, Coffee flavoring agent (non-screening), and Onion   Social History   Socioeconomic History   Marital status: Single    Spouse name: Not on file   Number of children: Not on file   Years of education: Not on file   Highest education level: Not on file  Occupational History   Not on file  Tobacco Use   Smoking status: Former    Current packs/day: 0.00    Average packs/day: 2.0 packs/day for 48.0 years (96.0 ttl pk-yrs)    Types: Cigarettes    Start date: 02/12/1972    Quit date: 02/12/2020    Years since quitting: 3.0   Smokeless tobacco: Never  Vaping Use   Vaping status: Never Used  Substance and Sexual Activity   Alcohol use: No    Comment: Never been a problem   Drug use: No   Sexual activity: Yes  Other Topics Concern   Not on file  Social History Narrative   Single, lives alone, unemployed.   Social Drivers of Health   Financial Resource Strain: Medium Risk (10/29/2022)   Overall Financial Resource Strain (CARDIA)    Difficulty of Paying Living Expenses: Somewhat hard  Food Insecurity: Food Insecurity Present (10/29/2022)   Hunger Vital Sign    Worried About Running Out of Food in the Last Year: Sometimes true    Ran Out of Food in the Last Year: Sometimes true  Transportation Needs: No Transportation Needs (10/29/2022)   PRAPARE - Administrator, Civil Service (Medical): No    Lack of Transportation (Non-Medical): No  Physical Activity: Inactive (10/29/2022)   Exercise Vital Sign    Days of Exercise per Week: 0 days    Minutes of  Exercise per Session: 0 min  Stress: No Stress Concern Present (10/29/2022)   [employer] of Occupational Health - Occupational Stress Questionnaire    Feeling of Stress : Not at all  Social Connections: Unknown (10/29/2022)   Social Connection and Isolation Panel [NHANES]    Frequency of Communication with Friends and Family: Once a week    Frequency of Social Gatherings with Friends and Family: Patient declined    Attends Religious Services: Patient declined    Database administrator or Organizations: No    Attends Engineer, structural: Never    Marital Status: Never married     Family History: The patient's family history includes Cancer in his maternal aunt; Colon cancer in his maternal uncle; Diabetes in his brother, mother, sister, sister, sister, and sister; Diabetes Mellitus II in his brother and mother; Heart disease in his father;  Hyperlipidemia in his brother; Tuberculosis in his father. There is no history of Prostate cancer.  ROS:   Please see the history of present illness.     All other systems reviewed and are negative.  EKGs/Labs/Other Studies Reviewed:    The following studies were reviewed today:   EKG Interpretation Date/Time:  Monday March 10 2023 11:12:24 EST Ventricular Rate:  107 PR Interval:  138 QRS Duration:  86 QT Interval:  328 QTC Calculation: 437 R Axis:   77  Text Interpretation: Sinus tachycardia Nonspecific T wave abnormality Confirmed by Debbe Odea ([id number]) on 03/10/2023 11:29:39 AM    Recent Labs: 12/16/2022: ALT 14; BUN 11; Creatinine, Ser 0.70; Hemoglobin 13.4; Platelets 440.0; Potassium 3.9; Sodium 142; TSH 1.37  Recent Lipid Panel    Component Value Date/Time   CHOL 137 05/15/2022 0849   CHOL 138 02/23/2021 1531   TRIG 105.0 05/15/2022 0849   HDL 40.30 05/15/2022 0849   HDL 41 02/23/2021 1531   CHOLHDL 3 05/15/2022 0849   VLDL 21.0 05/15/2022 0849   LDLCALC 76 05/15/2022 0849   LDLCALC 74 02/23/2021 1531    LDLCALC 40 01/21/2020 1505   LDLDIRECT 89.0 11/13/2021 1131    Physical Exam:    VS:  BP 102/62 (BP Location: Left Arm, Patient Position: Sitting, Cuff Size: Normal)   Pulse (!) 107   Ht 5\' 5"  (1.651 m)   Wt 172 lb 12.8 oz (78.4 kg)   SpO2 98%   BMI 28.76 kg/m     Wt Readings from Last 3 Encounters:  03/10/23 172 lb 12.8 oz (78.4 kg)  02/11/23 178 lb (80.7 kg)  12/02/22 174 lb 12.8 oz (79.3 kg)     GEN:  Well nourished, well developed in no acute distress HEENT: Normal NECK: No JVD; No carotid bruits CARDIAC: Tachycardic, regular, no murmurs, rubs, gallops RESPIRATORY:  Clear to auscultation without rales, wheezing or rhonchi  ABDOMEN: Soft, non-tender, non-distended MUSCULOSKELETAL:  No edema; No deformity  SKIN: Warm and dry NEUROLOGIC:  Alert and oriented x 3 PSYCHIATRIC:  Normal affect   ASSESSMENT:    1. Orthostatic hypotension   2. Inappropriate sinus tachycardia (HCC)   3. Mixed hyperlipidemia    PLAN:    In order of problems listed above:  Orthostatic hypotension, symptoms improved with midodrine although still present.  Increase midodrine to 10 mg in morning and afternoon.  Inappropriate sinus tachycardia, improved with digoxin.  Continue digoxin 0.125 daily.   Hyperlipidemia, cholesterol controlled, continue Lipitor 40 mg daily.  Follow-up 6 months.  Medication Adjustments/Labs and Tests Ordered: Current medicines are reviewed at length with the patient today.  Concerns regarding medicines are outlined above.  Orders Placed This Encounter  Procedures   EKG 12-Lead    Meds ordered this encounter  Medications   midodrine (PROAMATINE) 10 MG tablet    Sig: Take 1 tablet (10 mg total) by mouth 2 (two) times daily with a meal.    Dispense:  180 tablet    Refill:  0     Patient Instructions  Medication Instructions:   INCREASE Midodrine - Take one tablet ( 10mg ) in the Morning and one tablet ( 10mg ) in the afternoon.   *If you need a refill on  your cardiac medications before your next appointment, please call your pharmacy*   Lab Work:  None Ordered  If you have labs (blood work) drawn today and your tests are completely normal, you will receive your results only by: MyChart Message (if you have  MyChart) OR A paper copy in the mail If you have any lab test that is abnormal or we need to change your treatment, we will call you to review the results.   Testing/Procedures:  None Ordered   Follow-Up: At [facility], you and your health needs are our priority.  As part of our continuing mission to provide you with exceptional heart care, we have created designated Provider Care Teams.  These Care Teams include your primary Cardiologist (physician) and Advanced Practice Providers (APPs -  Physician Assistants and Nurse Practitioners) who all work together to provide you with the care you need, when you need it.  We recommend signing up for the patient portal called "MyChart".  Sign up information is provided on this After Visit Summary.  MyChart is used to connect with patients for Virtual Visits (Telemedicine).  Patients are able to view lab/test results, encounter notes, upcoming appointments, etc.  Non-urgent messages can be sent to your provider as well.   To learn more about what you can do with MyChart, go to [email or web address].    Your next appointment:   6 month(s)  Provider:   You may see Debbe Odea, MD or one of the following Advanced Practice Providers on your designated Care Team:   Nicolasa Ducking, NP Eula Listen, PA-C Cadence Fransico Michael, PA-C Charlsie Quest, NP Carlos Levering, NP   Signed, Debbe Odea, MD  03/10/2023 11:57 AM    [facility]

## 2023-03-11 DIAGNOSIS — H02883 Meibomian gland dysfunction of right eye, unspecified eyelid: Secondary | ICD-10-CM | POA: Diagnosis not present

## 2023-03-11 DIAGNOSIS — H2513 Age-related nuclear cataract, bilateral: Secondary | ICD-10-CM | POA: Diagnosis not present

## 2023-03-11 DIAGNOSIS — E119 Type 2 diabetes mellitus without complications: Secondary | ICD-10-CM | POA: Diagnosis not present

## 2023-03-11 LAB — HM DIABETES EYE EXAM

## 2023-03-12 ENCOUNTER — Other Ambulatory Visit (INDEPENDENT_AMBULATORY_CARE_PROVIDER_SITE_OTHER): Payer: Self-pay | Admitting: Nurse Practitioner

## 2023-03-12 DIAGNOSIS — D735 Infarction of spleen: Secondary | ICD-10-CM

## 2023-03-23 ENCOUNTER — Other Ambulatory Visit: Payer: Self-pay | Admitting: Family

## 2023-03-31 ENCOUNTER — Other Ambulatory Visit: Payer: Self-pay | Admitting: Family

## 2023-03-31 DIAGNOSIS — E119 Type 2 diabetes mellitus without complications: Secondary | ICD-10-CM

## 2023-04-09 ENCOUNTER — Encounter: Payer: Self-pay | Admitting: Oncology

## 2023-04-28 ENCOUNTER — Encounter: Payer: Self-pay | Admitting: Oncology

## 2023-05-02 ENCOUNTER — Other Ambulatory Visit: Payer: Self-pay | Admitting: Family

## 2023-05-02 MED ORDER — FREESTYLE LIBRE 3 SENSOR MISC: 0 refills | Status: DC

## 2023-05-05 ENCOUNTER — Ambulatory Visit
Admission: RE | Admit: 2023-05-05 | Discharge: 2023-05-05 | Disposition: A | Discharge status: Home / Self Care | Payer: 59 | Source: Ambulatory Visit | Attending: Acute Care | Admitting: Acute Care

## 2023-05-05 ENCOUNTER — Encounter: Payer: Self-pay | Admitting: Oncology

## 2023-05-05 DIAGNOSIS — Z122 Encounter for screening for malignant neoplasm of respiratory organs: Secondary | ICD-10-CM

## 2023-05-05 DIAGNOSIS — Z87891 Personal history of nicotine dependence: Secondary | ICD-10-CM | POA: Diagnosis not present

## 2023-05-26 ENCOUNTER — Other Ambulatory Visit: Payer: Self-pay

## 2023-05-26 DIAGNOSIS — Z122 Encounter for screening for malignant neoplasm of respiratory organs: Secondary | ICD-10-CM

## 2023-05-26 DIAGNOSIS — Z87891 Personal history of nicotine dependence: Secondary | ICD-10-CM

## 2023-05-31 ENCOUNTER — Other Ambulatory Visit: Payer: Self-pay | Admitting: Family

## 2023-05-31 DIAGNOSIS — E119 Type 2 diabetes mellitus without complications: Secondary | ICD-10-CM

## 2023-06-02 MED ORDER — FREESTYLE LIBRE 3 SENSOR MISC: 0 refills | Status: DC

## 2023-06-02 MED ORDER — OZEMPIC (2 MG/DOSE) 8 MG/3ML ~~LOC~~ SOPN: PEN_INJECTOR | SUBCUTANEOUS | 0 refills | Status: DC

## 2023-06-05 ENCOUNTER — Encounter: Payer: Self-pay | Admitting: Family

## 2023-06-12 ENCOUNTER — Encounter: Payer: Self-pay | Admitting: Family

## 2023-06-12 ENCOUNTER — Ambulatory Visit (INDEPENDENT_AMBULATORY_CARE_PROVIDER_SITE_OTHER): Admitting: Family

## 2023-06-12 VITALS — BP 112/70 | HR 100 | Temp 97.7°F | Ht 64.0 in | Wt 170.4 lb

## 2023-06-12 DIAGNOSIS — R7309 Other abnormal glucose: Secondary | ICD-10-CM

## 2023-06-12 DIAGNOSIS — Z7985 Long-term (current) use of injectable non-insulin antidiabetic drugs: Secondary | ICD-10-CM | POA: Diagnosis not present

## 2023-06-12 DIAGNOSIS — Z91148 Patient's other noncompliance with medication regimen for other reason: Secondary | ICD-10-CM | POA: Insufficient documentation

## 2023-06-12 DIAGNOSIS — R000 Tachycardia, unspecified: Secondary | ICD-10-CM | POA: Diagnosis not present

## 2023-06-12 DIAGNOSIS — E119 Type 2 diabetes mellitus without complications: Secondary | ICD-10-CM | POA: Diagnosis not present

## 2023-06-12 DIAGNOSIS — R1310 Dysphagia, unspecified: Secondary | ICD-10-CM | POA: Insufficient documentation

## 2023-06-12 DIAGNOSIS — I10 Essential (primary) hypertension: Secondary | ICD-10-CM

## 2023-06-12 LAB — CBC WITH DIFFERENTIAL/PLATELET
Basophils Absolute: 0.1 10*3/uL (ref 0.0–0.1)
Basophils Relative: 1 % (ref 0.0–3.0)
Eosinophils Absolute: 0 10*3/uL (ref 0.0–0.7)
Eosinophils Relative: 0.5 % (ref 0.0–5.0)
HCT: 41.9 % (ref 39.0–52.0)
Hemoglobin: 13.7 g/dL (ref 13.0–17.0)
Lymphocytes Relative: 35.2 % (ref 12.0–46.0)
Lymphs Abs: 2.2 10*3/uL (ref 0.7–4.0)
MCHC: 32.7 g/dL (ref 30.0–36.0)
MCV: 87.4 fl (ref 78.0–100.0)
Monocytes Absolute: 0.6 10*3/uL (ref 0.1–1.0)
Monocytes Relative: 10.3 % (ref 3.0–12.0)
Neutro Abs: 3.3 10*3/uL (ref 1.4–7.7)
Neutrophils Relative %: 53 % (ref 43.0–77.0)
Platelets: 393 10*3/uL (ref 150.0–400.0)
RBC: 4.79 Mil/uL (ref 4.22–5.81)
RDW: 16.1 % — ABNORMAL HIGH (ref 11.5–15.5)
WBC: 6.3 10*3/uL (ref 4.0–10.5)

## 2023-06-12 LAB — MAGNESIUM: Magnesium: 2.2 mg/dL (ref 1.5–2.5)

## 2023-06-12 LAB — B12 AND FOLATE PANEL
Folate: 12.9 ng/mL (ref >5.9)
Vitamin B-12: 186 pg/mL — ABNORMAL LOW (ref 211–911)

## 2023-06-12 LAB — COMPREHENSIVE METABOLIC PANEL WITH GFR
ALT: 18 U/L (ref 0–53)
AST: 13 U/L (ref 0–37)
Albumin: 4.5 g/dL (ref 3.5–5.2)
Alkaline Phosphatase: 76 U/L (ref 39–117)
BUN: 15 mg/dL (ref 6–23)
CO2: 28 meq/L (ref 19–32)
Calcium: 9.6 mg/dL (ref 8.4–10.5)
Chloride: 107 meq/L (ref 96–112)
Creatinine, Ser: 0.76 mg/dL (ref 0.40–1.50)
GFR: 99.68 mL/min (ref >60.00)
Glucose, Bld: 140 mg/dL — ABNORMAL HIGH (ref 70–99)
Potassium: 4 meq/L (ref 3.5–5.1)
Sodium: 142 meq/L (ref 135–145)
Total Bilirubin: 0.4 mg/dL (ref 0.2–1.2)
Total Protein: 7.2 g/dL (ref 6.0–8.3)

## 2023-06-12 LAB — POCT GLYCOSYLATED HEMOGLOBIN (HGB A1C): Hemoglobin A1C: 7.3 % — AB (ref 4.0–5.6)

## 2023-06-12 LAB — LIPID PANEL
Cholesterol: 133 mg/dL (ref 0–200)
HDL: 43.3 mg/dL (ref >39.00)
LDL Cholesterol: 77 mg/dL (ref 0–99)
NonHDL: 90.06
Total CHOL/HDL Ratio: 3
Triglycerides: 65 mg/dL (ref 0.0–149.0)
VLDL: 13 mg/dL (ref 0.0–40.0)

## 2023-06-12 LAB — TSH: TSH: 0.63 u[IU]/mL (ref 0.35–5.50)

## 2023-06-12 MED ORDER — FAMOTIDINE 20 MG PO TABS: 20.0000 mg | ORAL_TABLET | Freq: Every day | ORAL | 1 refills | Status: DC

## 2023-06-12 NOTE — Assessment & Plan Note (Signed)
 Reviewed previous Zio monitor August 2024.  Average heart rate 101 which is baseline today as well. EKG without acute changes when compared to prior 03/10/23 EKG.  Pending labs.  Follow-up with cardiology.  Will follow

## 2023-06-12 NOTE — Patient Instructions (Addendum)
 Start trial of medication for acid reflux pepcid ac once daily; may increase to twice daily in a couple of weeks if you feel helping  I have ordered a gastric emptying study to look for dysmotility, gastroparesis   Let us know if you dont hear back within a week in regards to an appointment being scheduled.   So that you are aware, if you are [facility] MyChart user , please pay attention to your MyChart messages as you may receive a MyChart message with a phone number to call and schedule this test/appointment own your own from our referral coordinator. This is a new process so I do not want you to miss this message.  If you are not a MyChart user, you will receive a phone call.     Please let me know if you feel palpitations or certainly symptomatic from palpitations.  Please avoid anything that stimulating including Sudafed or caffeine

## 2023-06-12 NOTE — Assessment & Plan Note (Signed)
 Chronic, improved from prior.  Endorses noncompliance with medications.  Wearing libre. We jointly agreed not to adjust diabetic medication at this time.  Average glucose 165, glucose management indicator 7.5%, glucose variability 30%.  No low or very low glucose.

## 2023-06-12 NOTE — Assessment & Plan Note (Signed)
 He did extremely well on MMSE 30/30. Pending  baseline labs. Encouraged pill box with alarm feature or use of his phone to remind him of medications.  We agreed to defer neuroimaging at this time

## 2023-06-12 NOTE — Assessment & Plan Note (Signed)
 Reviewed prior EGD.  For esophageal stricture, gastroparesis .  pending gastric emptying study, trial of Pepcid AC.  Close follow-up.  Plan on GI consult if symptoms persist.

## 2023-06-12 NOTE — Progress Notes (Signed)
 Assessment & Plan:  Diabetes mellitus without complication (HCC) Assessment & Plan: Chronic, improved from prior.  Endorses noncompliance with medications.  Wearing libre. We jointly agreed not to adjust diabetic medication at this time.  Average glucose 165, glucose management indicator 7.5%, glucose variability 30%.  No low or very low glucose.    Orders: -     Comprehensive metabolic panel with GFR -     Lipid panel -     CBC with Differential/Platelet -     B12 and Folate Panel -     TSH -     Magnesium  Elevated glucose -     POCT glycosylated hemoglobin (Hb A1C)  Dysphagia, unspecified type Assessment & Plan: Reviewed prior EGD.  For esophageal stricture, gastroparesis .  pending gastric emptying study, trial of Pepcid AC.  Close follow-up.  Plan on GI consult if symptoms persist.  Orders: -     NM GASTRIC EMPTYING; Future -     Famotidine; Take 1 tablet (20 mg total) by mouth at bedtime.  Dispense: 90 tablet; Refill: 1  Primary hypertension  History of medication noncompliance Assessment & Plan: He did extremely well on MMSE 30/30. Pending  baseline labs. Encouraged pill box with alarm feature or use of his phone to remind him of medications.  We agreed to defer neuroimaging at this time   Tachycardia Assessment & Plan: Reviewed previous [facility] monitor August 2024.  Average heart rate 101 which is baseline today as well. EKG without acute changes when compared to prior 03/10/23 EKG.  Pending labs.  Follow-up with cardiology.  Will follow  Orders: -     EKG 12-Lead     Return precautions given.   Risks, benefits, and alternatives of the medications and treatment plan prescribed today were discussed, and patient expressed understanding.   Education regarding symptom management and diagnosis given to patient on AVS either electronically or printed.  Return in about 6 weeks (around 07/24/2023).  Rennie Plowman, [name]  Subjective:    Patient ID: Benjamin Conley, male    DOB: 08-20-1965, 58 y.o.   MRN: [id number]  CC: Benjamin Conley is a 58 y.o. male who presents today for follow up.   HPI: Feels well today  He endorses being more forgetful with medication and he will take the next day.   He lives alone.    Denies pain with swallowing, fever, chills, unusual weight loss.  Appetite is good. There have been episodes of eating bread such cornbread in which he feels may hard to swallowing.      Denies a feeling of heart palpitations.  No chest pain or dizziness. He states 'my heart rate is always like that'   former smoker No caffeine.   Zio Monitor 10/10/2022, Dr Sandie Ano Average HR 101.  occasional paroxysmal SVTs.  No significant or sustained arrhythmias.  Cardiology follow-up 09/03/23   Ct chest lung cancer screen 05/05/23  MRI brain 08/10/2019 No acute intracranial abnormality, chronic microvascular ischemia.   Colonoscopy for acute hemorrhagic anemia 2021 , internal hemorrhoids.  EGD 08/24/19 normal stomach , esophagus.     Allergies: Wasp venom, Wasp venom protein, Coffee flavoring agent (non-screening), and Onion Current Outpatient Medications on File Prior to Visit  Medication Sig Dispense Refill   aspirin 81 MG EC tablet Take 1 tablet (81 mg total) by mouth daily. 90 tablet 3   atorvastatin (LIPITOR) 40 MG tablet Take 1 tablet (40 mg total) by mouth daily. 90 tablet 3  blood glucose meter kit and supplies Dispense based on patient and insurance preference. Use up to four times daily as directed. (FOR ICD-10 E10.9, E11.9). 1 each 0   clopidogrel (PLAVIX) 75 MG tablet Take 1 tablet by mouth once daily 90 tablet 1   Continuous Glucose Sensor (FREESTYLE LIBRE 3 SENSOR) MISC USE ONE SENSOR EVERY 14 DAYS TO CHECK GLUCOSE CONTINOUSLY 2 each 0   digoxin (LANOXIN) 0.125 MG tablet Take 1 tablet (0.125 mg total) by mouth daily. 30 tablet 3   empagliflozin (JARDIANCE) 25 MG TABS tablet Take 1 tablet (25 mg total) by mouth daily before  breakfast. 90 tablet 3   nystatin-triamcinolone ointment (MYCOLOG) Apply 1 application topically 2 (two) times daily. 60 g 0   Semaglutide, 2 MG/DOSE, (OZEMPIC, 2 MG/DOSE,) 8 MG/3ML SOPN INJECT 2MG  INTO THE SKIN ONCE WEEKLY 9 mL 0   sildenafil (VIAGRA) 100 MG tablet Take 1 tablet (100 mg total) by mouth daily as needed for erectile dysfunction. 30 tablet 0   silodosin (RAPAFLO) 8 MG CAPS capsule Take 1 capsule (8 mg total) by mouth daily with breakfast. 30 capsule 6   triamcinolone cream (KENALOG) 0.1 % Apply 1 application topically 2 (two) times daily. 30 g 0   No current facility-administered medications on file prior to visit.    Review of Systems  Constitutional:  Negative for chills and fever.  HENT:  Positive for trouble swallowing.   Eyes:  Negative for visual disturbance.  Respiratory:  Negative for cough.   Cardiovascular:  Negative for chest pain and palpitations.  Gastrointestinal:  Negative for nausea and vomiting.  Neurological:  Negative for headaches.      Objective:    BP 112/70   Pulse 100 Comment: ekg  Temp 97.7 F (36.5 C) (Oral)   Ht 5\' 4"  (1.626 m)   Wt 170 lb 6.4 oz (77.3 kg)   SpO2 97%   BMI 29.25 kg/m  BP Readings from Last 3 Encounters:  06/12/23 112/70  03/10/23 102/62  02/11/23 131/87   Wt Readings from Last 3 Encounters:  06/12/23 170 lb 6.4 oz (77.3 kg)  03/10/23 172 lb 12.8 oz (78.4 kg)  02/11/23 178 lb (80.7 kg)    Physical Exam Vitals reviewed.  Constitutional:      Appearance: He is well-developed.  Cardiovascular:     Rate and Rhythm: Regular rhythm. Tachycardia present.     Heart sounds: Normal heart sounds.  Pulmonary:     Effort: Pulmonary effort is normal. No respiratory distress.     Breath sounds: Normal breath sounds. No wheezing, rhonchi or rales.  Skin:    General: Skin is warm and dry.  Neurological:     Mental Status: He is alert.  Psychiatric:        Speech: Speech normal.        Behavior: Behavior normal.

## 2023-06-13 ENCOUNTER — Encounter: Payer: Self-pay | Admitting: Family

## 2023-06-14 ENCOUNTER — Emergency Department

## 2023-06-14 ENCOUNTER — Emergency Department
Admission: EM | Admit: 2023-06-14 | Discharge: 2023-06-14 | Disposition: A | Discharge status: Home / Self Care | Attending: Emergency Medicine | Admitting: Emergency Medicine

## 2023-06-14 ENCOUNTER — Encounter: Payer: Self-pay | Admitting: Emergency Medicine

## 2023-06-14 DIAGNOSIS — R000 Tachycardia, unspecified: Secondary | ICD-10-CM | POA: Diagnosis not present

## 2023-06-14 DIAGNOSIS — I4711 Inappropriate sinus tachycardia, so stated: Secondary | ICD-10-CM

## 2023-06-14 DIAGNOSIS — R251 Tremor, unspecified: Secondary | ICD-10-CM | POA: Insufficient documentation

## 2023-06-14 DIAGNOSIS — R009 Unspecified abnormalities of heart beat: Secondary | ICD-10-CM | POA: Diagnosis not present

## 2023-06-14 DIAGNOSIS — J449 Chronic obstructive pulmonary disease, unspecified: Secondary | ICD-10-CM | POA: Insufficient documentation

## 2023-06-14 DIAGNOSIS — E119 Type 2 diabetes mellitus without complications: Secondary | ICD-10-CM | POA: Diagnosis not present

## 2023-06-14 LAB — COMPREHENSIVE METABOLIC PANEL WITH GFR
ALT: 20 U/L (ref 0–44)
AST: 19 U/L (ref 15–41)
Albumin: 4 g/dL (ref 3.5–5.0)
Alkaline Phosphatase: 64 U/L (ref 38–126)
Anion gap: 10 (ref 5–15)
BUN: 17 mg/dL (ref 6–20)
CO2: 24 mmol/L (ref 22–32)
Calcium: 9.2 mg/dL (ref 8.9–10.3)
Chloride: 106 mmol/L (ref 98–111)
Creatinine, Ser: 0.72 mg/dL (ref 0.61–1.24)
GFR, Estimated: >60 mL/min (ref >60)
Glucose, Bld: 187 mg/dL — ABNORMAL HIGH (ref 70–99)
Potassium: 3.4 mmol/L — ABNORMAL LOW (ref 3.5–5.1)
Sodium: 140 mmol/L (ref 135–145)
Total Bilirubin: 0.6 mg/dL (ref 0.0–1.2)
Total Protein: 7.4 g/dL (ref 6.5–8.1)

## 2023-06-14 LAB — CBC WITH DIFFERENTIAL/PLATELET
Abs Immature Granulocytes: 0.02 10*3/uL (ref 0.00–0.07)
Basophils Absolute: 0.1 10*3/uL (ref 0.0–0.1)
Basophils Relative: 1 %
Eosinophils Absolute: 0.1 10*3/uL (ref 0.0–0.5)
Eosinophils Relative: 1 %
HCT: 40.2 % (ref 39.0–52.0)
Hemoglobin: 13 g/dL (ref 13.0–17.0)
Immature Granulocytes: 0 %
Lymphocytes Relative: 50 %
Lymphs Abs: 3.7 10*3/uL (ref 0.7–4.0)
MCH: 28 pg (ref 26.0–34.0)
MCHC: 32.3 g/dL (ref 30.0–36.0)
MCV: 86.6 fL (ref 80.0–100.0)
Monocytes Absolute: 0.7 10*3/uL (ref 0.1–1.0)
Monocytes Relative: 9 %
Neutro Abs: 2.8 10*3/uL (ref 1.7–7.7)
Neutrophils Relative %: 39 %
Platelets: 406 10*3/uL — ABNORMAL HIGH (ref 150–400)
RBC: 4.64 MIL/uL (ref 4.22–5.81)
RDW: 14.9 % (ref 11.5–15.5)
WBC: 7.3 10*3/uL (ref 4.0–10.5)
nRBC: 0 % (ref 0.0–0.2)

## 2023-06-14 LAB — TROPONIN I (HIGH SENSITIVITY): Troponin I (High Sensitivity): 4 ng/L (ref <18)

## 2023-06-14 NOTE — ED Provider Notes (Signed)
 [facility] Provider Note    Event Date/Time   First MD Initiated Contact with Patient 06/14/23 2137     (approximate)   History   No chief complaint on file.   HPI {Remember to add pertinent medical, surgical, social, and/or OB history to HPI:1} Benjamin Conley is a 58 y.o. male  ***       Physical Exam   Triage Vital Signs: ED Triage Vitals  Encounter Vitals Group     BP 06/14/23 2007 (!) 143/95     Systolic BP Percentile --      Diastolic BP Percentile --      Pulse Rate 06/14/23 2007 (!) 114     Resp 06/14/23 2007 18     Temp 06/14/23 2007 98.1 F (36.7 C)     Temp Source 06/14/23 2007 Oral     SpO2 06/14/23 2007 96 %     Weight 06/14/23 2008 175 lb (79.4 kg)     Height 06/14/23 2008 5\' 4"  (1.626 m)     Head Circumference --      Peak Flow --      Pain Score 06/14/23 2011 0     Pain Loc --      Pain Education --      Exclude from Growth Chart --     Most recent vital signs: Vitals:   06/14/23 2007  BP: (!) 143/95  Pulse: (!) 114  Resp: 18  Temp: 98.1 F (36.7 C)  SpO2: 96%    {Only need to document appropriate and relevant physical exam:1} General: Awake, no distress. *** CV:  Good peripheral perfusion. *** Resp:  Normal effort. *** Abd:  No distention. *** Other:  ***   ED Results / Procedures / Treatments   Labs (all labs ordered are listed, but only abnormal results are displayed) Labs Reviewed  COMPREHENSIVE METABOLIC PANEL WITH GFR - Abnormal; Notable for the following components:      Result Value   Potassium 3.4 (*)    Glucose, Bld 187 (*)    All other components within normal limits  CBC WITH DIFFERENTIAL/PLATELET - Abnormal; Notable for the following components:   Platelets 406 (*)    All other components within normal limits  TROPONIN I (HIGH SENSITIVITY)     EKG  ***   RADIOLOGY *** {USE THE WORD "INTERPRETED"!! You MUST document your own interpretation of imaging, as well as the fact  that you reviewed the radiologist's report!:1}   PROCEDURES:  Critical Care performed: {CriticalCareYesNo:19197::"Yes, see critical care procedure note(s)","No"}  Procedures   MEDICATIONS ORDERED IN ED: Medications - No data to display   IMPRESSION / MDM / ASSESSMENT AND PLAN / ED COURSE  I reviewed the triage vital signs and the nursing notes.                              Differential diagnosis includes, but is not limited to, ***  Patient's presentation is most consistent with {EM COPA:27473}  *** {If the patient is on the monitor, remove the brackets and asterisks on the sentence below and remember to document it as a Procedure as well. Otherwise delete the sentence below:1} {**The patient is on the cardiac monitor to evaluate for evidence of arrhythmia and/or significant heart rate changes.**} {Remember to include, when applicable, any/all of the following data: independent review of imaging independent review of labs (comment specifically on pertinent positives and negatives)  review of specific prior hospitalizations, PCP/specialist notes, etc. discuss meds given and prescribed document any discussion with consultants (including hospitalists) any clinical decision tools you used and why (PECARN, NEXUS, etc.) did you consider admitting the patient? document social determinants of health affecting patient's care (homelessness, inability to follow up in a timely fashion, etc) document any pre-existing conditions increasing risk on current visit (e.g. diabetes and HTN increasing danger of high-risk chest pain/ACS) describes what meds you gave (especially parenteral) and why any other interventions?:1}     FINAL CLINICAL IMPRESSION(S) / ED DIAGNOSES   Final diagnoses:  None     Rx / DC Orders   ED Discharge Orders     None        Note:  This document was prepared using Dragon voice recognition software and may include unintentional dictation errors.

## 2023-06-14 NOTE — Discharge Instructions (Signed)
 Continue taking the digoxin, midodrine, and your other medications as prescribed.  Follow-up with cardiology.  Return to the ER for new, worsening, or persistent severe symptoms including elevated heart rates above 110 or 120, low blood pressure readings, feeling lightheaded like you might pass out, or any other new or worsening symptoms that concern you.

## 2023-06-14 NOTE — ED Triage Notes (Addendum)
 PT states HR is fluctuating in 110s. He was feeling shaky and went to fix something to eat. CBG was 130s so he checked HR and it 103 and dropped down to 40s. Pt called [employer] MD and was told to come in for evaluation. Denies chest pain or SOB or palpations. Hx of CAD. Takes thinners. Denies hx of afib

## 2023-06-14 NOTE — Group Note (Deleted)
 Date:  06/14/2023 Time:  9:53 PM  Group Topic/Focus:  Wrap-Up Group:   The focus of this group is to help patients review their daily goal of treatment and discuss progress on daily workbooks.     Participation Level:  {BHH PARTICIPATION WUJWJ:19147}  Participation Quality:  {BHH PARTICIPATION QUALITY:22265}  Affect:  {BHH AFFECT:22266}  Cognitive:  {BHH COGNITIVE:22267}  Insight: {BHH Insight2:20797}  Engagement in Group:  {BHH ENGAGEMENT IN WGNFA:21308}  Modes of Intervention:  {BHH MODES OF INTERVENTION:22269}  Additional Comments:  ***  Maglione,Angell Honse E 06/14/2023, 9:53 PM

## 2023-06-16 ENCOUNTER — Telehealth: Payer: Self-pay | Admitting: Family

## 2023-06-16 NOTE — Telephone Encounter (Signed)
Lft pt vm to call ofc to sch NM gastric emptying. thanks

## 2023-06-17 ENCOUNTER — Telehealth: Payer: Self-pay | Admitting: Family

## 2023-06-17 NOTE — Telephone Encounter (Signed)
 Lft pt vm to call ofc to sch nm gastric emptying. thanks

## 2023-06-22 ENCOUNTER — Telehealth: Admitting: Family Medicine

## 2023-06-22 ENCOUNTER — Emergency Department
Admission: EM | Admit: 2023-06-22 | Discharge: 2023-06-22 | Disposition: A | Discharge status: Home / Self Care | Attending: Emergency Medicine | Admitting: Emergency Medicine

## 2023-06-22 ENCOUNTER — Emergency Department

## 2023-06-22 ENCOUNTER — Other Ambulatory Visit: Payer: Self-pay

## 2023-06-22 DIAGNOSIS — J449 Chronic obstructive pulmonary disease, unspecified: Secondary | ICD-10-CM | POA: Insufficient documentation

## 2023-06-22 DIAGNOSIS — M5412 Radiculopathy, cervical region: Secondary | ICD-10-CM | POA: Insufficient documentation

## 2023-06-22 DIAGNOSIS — M25512 Pain in left shoulder: Secondary | ICD-10-CM

## 2023-06-22 DIAGNOSIS — R000 Tachycardia, unspecified: Secondary | ICD-10-CM | POA: Insufficient documentation

## 2023-06-22 DIAGNOSIS — E119 Type 2 diabetes mellitus without complications: Secondary | ICD-10-CM | POA: Insufficient documentation

## 2023-06-22 DIAGNOSIS — M542 Cervicalgia: Secondary | ICD-10-CM

## 2023-06-22 DIAGNOSIS — D7389 Other diseases of spleen: Secondary | ICD-10-CM | POA: Diagnosis not present

## 2023-06-22 DIAGNOSIS — Z7901 Long term (current) use of anticoagulants: Secondary | ICD-10-CM | POA: Diagnosis not present

## 2023-06-22 LAB — BASIC METABOLIC PANEL WITH GFR
Anion gap: 12 (ref 5–15)
BUN: 19 mg/dL (ref 6–20)
CO2: 21 mmol/L — ABNORMAL LOW (ref 22–32)
Calcium: 9.5 mg/dL (ref 8.9–10.3)
Chloride: 106 mmol/L (ref 98–111)
Creatinine, Ser: 0.75 mg/dL (ref 0.61–1.24)
GFR, Estimated: >60 mL/min (ref >60)
Glucose, Bld: 173 mg/dL — ABNORMAL HIGH (ref 70–99)
Potassium: 3.8 mmol/L (ref 3.5–5.1)
Sodium: 139 mmol/L (ref 135–145)

## 2023-06-22 LAB — CBC
HCT: 42.5 % (ref 39.0–52.0)
Hemoglobin: 13.9 g/dL (ref 13.0–17.0)
MCH: 28.5 pg (ref 26.0–34.0)
MCHC: 32.7 g/dL (ref 30.0–36.0)
MCV: 87.1 fL (ref 80.0–100.0)
Platelets: 453 10*3/uL — ABNORMAL HIGH (ref 150–400)
RBC: 4.88 MIL/uL (ref 4.22–5.81)
RDW: 15.5 % (ref 11.5–15.5)
WBC: 6.5 10*3/uL (ref 4.0–10.5)
nRBC: 0 % (ref 0.0–0.2)

## 2023-06-22 LAB — TROPONIN I (HIGH SENSITIVITY)
Troponin I (High Sensitivity): 3 ng/L (ref <18)
Troponin I (High Sensitivity): 4 ng/L (ref <18)

## 2023-06-22 MED ORDER — IOHEXOL 350 MG/ML SOLN
75.0000 mL | Freq: Once | INTRAVENOUS | Status: AC | PRN
  Administered 2023-06-22: 75 mL via INTRAVENOUS

## 2023-06-22 MED ORDER — SODIUM CHLORIDE 0.9 % IV BOLUS
500.0000 mL | Freq: Once | INTRAVENOUS | Status: AC
  Administered 2023-06-22: 500 mL via INTRAVENOUS

## 2023-06-22 NOTE — ED Triage Notes (Addendum)
 Pt comes with left shoulder and neck pain. Pt states this started yesterday. Pt states he has impinged shoulder. Pt states he [facility] that and his BP was elevated so he was told to come here.   Pt denies any sob, cp or dizziness. Pt does states he was here few days ago because his HR was bouncing all around.

## 2023-06-22 NOTE — ED Notes (Signed)
 Received handoff from Zach, [place]

## 2023-06-22 NOTE — ED Provider Notes (Signed)
 CTAPE without concerning abnormalities. Second trop negative. Discussed with patient. He feels comfortable with discharge home at this time. Will discharge with paperwork prepared by Dr. Dodson Freestone.    Marylynn Soho, MD 06/22/23 [phone]

## 2023-06-22 NOTE — Progress Notes (Signed)
 Virtual Visit Consent   Benjamin Conley, you are scheduled for a virtual visit with a [facility] provider today. Just as with appointments in the office, your consent must be obtained to participate. Your consent will be active for this visit and any virtual visit you may have with one of our providers in the next 365 days. If you have a MyChart account, a copy of this consent can be sent to you electronically.  As this is a virtual visit, video technology does not allow for your provider to perform a traditional examination. This may limit your provider's ability to fully assess your condition. If your provider identifies any concerns that need to be evaluated in person or the need to arrange testing (such as labs, EKG, etc.), we will make arrangements to do so. Although advances in technology are sophisticated, we cannot ensure that it will always work on either your end or our end. If the connection with a video visit is poor, the visit may have to be switched to a telephone visit. With either a video or telephone visit, we are not always able to ensure that we have a secure connection.  By engaging in this virtual visit, you consent to the provision of healthcare and authorize for your insurance to be billed (if applicable) for the services provided during this visit. Depending on your insurance coverage, you may receive a charge related to this service.  I need to obtain your verbal consent now. Are you willing to proceed with your visit today? Benjamin Conley has provided verbal consent on 06/22/2023 for a virtual visit (video or telephone). Benjamin Huger, [name]  Date: 06/22/2023 11:39 AM   Virtual Visit via Video Note   I, Benjamin Conley, connected with  Benjamin Conley  ([id number], 07/01/1965) on 06/22/23 at 11:30 AM EDT by a video-enabled telemedicine application and verified that I am speaking with the correct person using two identifiers.  Location: Patient: Home Provider: Virtual  Visit Location Provider: Home Office   I discussed the limitations of evaluation and management by telemedicine and the availability of in person appointments. The patient expressed understanding and agreed to proceed.    History of Present Illness: Benjamin Conley is a 58 y.o. who identifies as a male who was assigned male at birth, and is being seen today for left shoulder and left neck pain with BP 173/107. He has a significant cardiac history. He says the pain has worsened since last night and he has had this(nerve pain) for the past 5 yrs and its never been this bad. He does deny SOB, dizziness, and diaphoresis and nausea. Benjamin Conley  HPI: HPI  Problems:  Patient Active Problem List   Diagnosis Date Noted   Dysphagia 06/12/2023   History of medication noncompliance 06/12/2023   Open toe wound 10/30/2022   PAD (peripheral artery disease) (HCC) 07/19/2022   Anemia 05/15/2022   Photophobia of both eyes 02/20/2022   Bilateral hand pain 08/13/2021   Dizziness 06/11/2021   Sinus congestion 06/11/2021   Pulmonary emphysema (HCC) 05/07/2021   GERD (gastroesophageal reflux disease) 01/12/2021   Screening for blood or protein in urine 06/16/2020   Contact dermatitis 06/16/2020   Leukocytosis 01/30/2020   Nicotine use disorder 01/27/2020   Slow transit constipation 01/27/2020   Nail avulsion of toe, initial encounter 12/09/2019   Benign prostatic hyperplasia with hesitancy 10/30/2019   Abnormal ejaculation 10/21/2019   B12 deficiency 10/21/2019   Rectal pressure 10/21/2019   Thrombosis of splenic  artery (HCC) 10/03/2019   Abnormal brain MRI 09/22/2019   Difficulty sleeping 09/22/2019   Tinnitus of both ears 09/22/2019   Pain due to onychomycosis of toenails of both feet 09/06/2019   Coagulation disorder (HCC) 09/06/2019   Iron deficiency anemia due to chronic blood loss 08/29/2019   Thrombocytosis 08/24/2019   Angiodysplasia of intestinal tract    Black stool 08/22/2019   Presence of  arterial stent - splenic artery 08/21/2019   Orthostatic hypotension 08/19/2019   Tachycardia 08/19/2019   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   COPD with chronic bronchitis (HCC) 08/01/2019   Tobacco abuse 08/01/2019   Diabetes mellitus without complication (HCC)    Hyperlipidemia    Impingement syndrome of shoulder region 03/30/2018   Hypertension 11/10/2013    Allergies:  Allergies  Allergen Reactions   Wasp Venom Anaphylaxis   Wasp Venom Protein Anaphylaxis   Coffee Flavoring Agent (Non-Screening) Nausea And Vomiting    Patient states coffee gives him nausea and stomach cramps   Onion Nausea And Vomiting   Medications:  Current Outpatient Medications:    aspirin 81 MG EC tablet, Take 1 tablet (81 mg total) by mouth daily., Disp: 90 tablet, Rfl: 3   atorvastatin (LIPITOR) 40 MG tablet, Take 1 tablet (40 mg total) by mouth daily., Disp: 90 tablet, Rfl: 3   blood glucose meter kit and supplies, Dispense based on patient and insurance preference. Use up to four times daily as directed. (FOR ICD-10 E10.9, E11.9)., Disp: 1 each, Rfl: 0   clopidogrel (PLAVIX) 75 MG tablet, Take 1 tablet by mouth once daily, Disp: 90 tablet, Rfl: 1   Continuous Glucose Sensor (FREESTYLE LIBRE 3 SENSOR) MISC, USE ONE SENSOR EVERY 14 DAYS TO CHECK GLUCOSE CONTINOUSLY, Disp: 2 each, Rfl: 0   digoxin (LANOXIN) 0.125 MG tablet, Take 1 tablet (0.125 mg total) by mouth daily., Disp: 30 tablet, Rfl: 3   empagliflozin (JARDIANCE) 25 MG TABS tablet, Take 1 tablet (25 mg total) by mouth daily before breakfast., Disp: 90 tablet, Rfl: 3   famotidine (PEPCID) 20 MG tablet, Take 1 tablet (20 mg total) by mouth at bedtime., Disp: 90 tablet, Rfl: 1   nystatin-triamcinolone ointment (MYCOLOG), Apply 1 application topically 2 (two) times daily., Disp: 60 g, Rfl: 0   Semaglutide, 2 MG/DOSE, (OZEMPIC, 2 MG/DOSE,) 8 MG/3ML SOPN, INJECT 2MG  INTO THE SKIN ONCE WEEKLY, Disp: 9 mL, Rfl: 0   sildenafil (VIAGRA) 100 MG tablet, Take 1 tablet  (100 mg total) by mouth daily as needed for erectile dysfunction., Disp: 30 tablet, Rfl: 0   silodosin (RAPAFLO) 8 MG CAPS capsule, Take 1 capsule (8 mg total) by mouth daily with breakfast., Disp: 30 capsule, Rfl: 6   triamcinolone cream (KENALOG) 0.1 %, Apply 1 application topically 2 (two) times daily., Disp: 30 g, Rfl: 0  Observations/Objective: Patient is well-developed, well-nourished in no acute distress.  Resting comfortably  at home.  Head is normocephalic, atraumatic.  No labored breathing.  Speech is clear and coherent with logical content.  Patient is alert and oriented at baseline.    Assessment and Plan: 1. Acute pain of left shoulder (Primary)  2. Neck pain on left side  Advised to go to the ED. Multiple risk factors.   Follow Up Instructions: I discussed the assessment and treatment plan with the patient. The patient was provided an opportunity to ask questions and all were answered. The patient agreed with the plan and demonstrated an understanding of the instructions.  A copy of instructions were sent  to the patient via MyChart unless otherwise noted below.     The patient was advised to call back or seek an in-person evaluation if the symptoms worsen or if the condition fails to improve as anticipated.    Bethany Hirt, [name]

## 2023-06-22 NOTE — Discharge Instructions (Addendum)
 You are seen in the emergency department for left shoulder and chest pain.  Your blood pressure improved while in the emergency department with no intervention.  Your heart rate came down with some fluids.  You have a CT scan done to evaluate for blood clots.  You have 2 heart enzymes that did not show an elevation or concerns for heart attack today.  Some findings to your left shoulder but could be from shingles.  Given that these rash has been present for 2 weeks do not feel that medications would be helpful at this time however follow-up closely with your primary care physician to discuss further management.  Call and follow-up closely with your primary care physician.  Return for any ongoing or worsening symptoms.

## 2023-06-22 NOTE — Patient Instructions (Signed)
Shoulder Pain Many things can cause shoulder pain, including: An injury to the shoulder. Overuse of the shoulder. Arthritis. The source of the pain can be: Inflammation. An injury to the shoulder joint. An injury to a tendon, ligament, or bone. Follow these instructions at home: Pay attention to changes in your symptoms. Let your health care provider know about them. Follow these instructions to relieve your pain. If you have a removable sling: Wear the sling as told by your provider. Remove it only as told by your provider. Check the skin around the sling every day. Tell your provider about any concerns. Loosen the sling if your fingers tingle, become numb, or become cold. Keep the sling clean. If the sling is not waterproof: Do not let it get wet. Remove it to shower or bathe. Move your arm as little as possible, but keep your hand moving to prevent swelling. Managing pain, stiffness, and swelling  If told, put ice on the painful area. If you have a removable sling or immobilizer, remove it as told by your provider. Put ice in a plastic bag. Place a towel between your skin and the bag. Leave the ice on for 20 minutes, 2-3 times a day. If your skin turns bright red, remove the ice right away to prevent skin damage. The risk of damage is higher if you cannot feel pain, heat, or cold. Move your fingers often to reduce stiffness and swelling. Squeeze a soft ball or a foam pad as much as possible. This helps to keep the shoulder from swelling. It also helps to strengthen the arm. General instructions Take over-the-counter and prescription medicines only as told by your provider. Exercise may help with pain management. Perform exercises if told by your provider. You may be referred to a physical therapist to help in your recovery process. Keep all follow-up visits in order to avoid any type of permanent shoulder disability or chronic pain problems. Contact a health care provider  if: Your pain is not relieved with medicines. New pain develops in your arm, hand, or fingers. You loosen your sling and your arm, hand, or fingers remain tingly, numb, swollen, or painful. Get help right away if: Your arm, hand, or fingers turn white or blue. This information is not intended to replace advice given to you by your health care provider. Make sure you discuss any questions you have with your health care provider. Document Revised: 09/28/2021 Document Reviewed: 09/28/2021 Elsevier Patient Education  2024 [employer]  

## 2023-06-22 NOTE — ED Provider Notes (Signed)
 [facility] Provider Note    Event Date/Time   First MD Initiated Contact with Patient 06/22/23 1304     (approximate)   History   Shoulder Pain   HPI  Benjamin Conley is a 58 y.o. male past medical history significant for sinus tachycardia, orthostatic hypotension, diabetes, prior splenic infarct on anticoagulation COPD, who presents to the emergency department for left shoulder pain.  States that he had a burning sensation to his left shoulder.  Does state that he has had a history of nerve impingement in the past.  Went to urgent care today and was told that his blood pressure was elevated and he needed to come to the emergency department.  States he is feeling okay at this time.  States that he noticed a small rash to his left shoulder approximately 2 weeks ago.  No falls or trauma.     Physical Exam   Triage Vital Signs: ED Triage Vitals  Encounter Vitals Group     BP 06/22/23 1219 (!) 158/100     Systolic BP Percentile --      Diastolic BP Percentile --      Pulse Rate 06/22/23 1219 100     Resp 06/22/23 1219 18     Temp 06/22/23 1219 98 F (36.7 C)     Temp src --      SpO2 06/22/23 1219 100 %     Weight 06/22/23 1219 175 lb (79.4 kg)     Height 06/22/23 1219 5\' 4"  (1.626 m)     Head Circumference --      Peak Flow --      Pain Score 06/22/23 1218 5     Pain Loc --      Pain Education --      Exclude from Growth Chart --     Most recent vital signs: Vitals:   06/22/23 1330 06/22/23 1400  BP: 117/80 115/76  Pulse: (!) 107 (!) 104  Resp:    Temp:    SpO2: 98% 98%    Physical Exam Constitutional:      Appearance: He is well-developed.  HENT:     Head: Atraumatic.  Eyes:     Extraocular Movements: Extraocular movements intact.     Conjunctiva/sclera: Conjunctivae normal.     Pupils: Pupils are equal, round, and reactive to light.  Cardiovascular:     Rate and Rhythm: Regular rhythm. Tachycardia present.  Pulmonary:      Effort: No respiratory distress.  Abdominal:     Tenderness: There is no abdominal tenderness.  Musculoskeletal:        General: Normal range of motion.     Cervical back: Normal range of motion.     Comments: Mild pain with range of motion of the left shoulder.  +2 radial pulses that are equal bilaterally.  No midline cervical spine tenderness.  Small rash to the left upper shoulder.  Appears scarred over.  Skin:    General: Skin is warm.  Neurological:     Mental Status: He is alert. Mental status is at baseline.     IMPRESSION / MDM / ASSESSMENT AND PLAN / ED COURSE  I reviewed the triage vital signs and the nursing notes.  On chart review patient has a history of tachycardia.  On arrival afebrile, hemodynamically stable.  Initial blood pressure was reading is 158/100 however repeat blood pressure in the emergency department without any intervention 115/76.  Was 97% on room air.  Offered pain medication however patient declined.  Differential diagnosis including ACS, pulmonary embolism, radiculopathy, nerve impingement, musculoskeletal strain, post herpetic neuralgia  EKG  I, Viviano Ground, the attending physician, personally viewed and interpreted this ECG.   Rate: Normal  Rhythm: Normal sinus  Axis: Normal  Intervals: Normal  ST&T Change: None  No tachycardic or bradycardic dysrhythmias while on cardiac telemetry.  RADIOLOGY I independently reviewed imaging, my interpretation of imaging: Chest x-ray no signs of pneumonia  LABS (all labs ordered are listed, but only abnormal results are displayed) Labs interpreted as -    Labs Reviewed  BASIC METABOLIC PANEL WITH GFR - Abnormal; Notable for the following components:      Result Value   CO2 21 (*)    Glucose, Bld 173 (*)    All other components within normal limits  CBC - Abnormal; Notable for the following components:   Platelets 453 (*)    All other components within normal limits  TROPONIN I (HIGH SENSITIVITY)   TROPONIN I (HIGH SENSITIVITY)     MDM  On chart view has a history of tachycardia.  EKG was sinus tachycardia with no significant change when compared to prior.  Initial troponin is negative.  High risk Wells criteria so will obtain a CTA to further evaluate for possible pulmonary embolism.  Second troponin is pending.  If serial troponins are negative and CTA is negative for PE feel that the patient would be safe to discharge home.  Discussed close follow-up with primary care provider and with cardiology.  Discussed return precautions to the emergency department.  No questions.  Care transferred to incoming provider with CTA pending.  If CT is negative plan to discharge home.     PROCEDURES:  Critical Care performed: No  Procedures  Patient's presentation is most consistent with acute presentation with potential threat to life or bodily function.   MEDICATIONS ORDERED IN ED: Medications  sodium chloride 0.9 % bolus 500 mL (0 mLs Intravenous Stopped 06/22/23 1523)    FINAL CLINICAL IMPRESSION(S) / ED DIAGNOSES   Final diagnoses:  Acute pain of left shoulder  Cervical radiculopathy     Rx / DC Orders   ED Discharge Orders     None        Note:  This document was prepared using Dragon voice recognition software and may include unintentional dictation errors.   Viviano Ground, MD 06/22/23 1530

## 2023-06-23 ENCOUNTER — Encounter: Payer: Self-pay | Admitting: Family

## 2023-06-23 ENCOUNTER — Encounter: Payer: Self-pay | Admitting: Oncology

## 2023-06-23 NOTE — Telephone Encounter (Signed)
 Patient is scheduled for an appointment on Thursday at 11:20 with NP Deborra Falter

## 2023-06-25 ENCOUNTER — Other Ambulatory Visit: Payer: Self-pay | Admitting: Family

## 2023-06-26 ENCOUNTER — Ambulatory Visit (INDEPENDENT_AMBULATORY_CARE_PROVIDER_SITE_OTHER): Admitting: Nurse Practitioner

## 2023-06-26 ENCOUNTER — Encounter: Payer: Self-pay | Admitting: Nurse Practitioner

## 2023-06-26 VITALS — BP 120/84 | HR 102 | Temp 97.5°F | Ht 64.0 in | Wt 174.0 lb

## 2023-06-26 DIAGNOSIS — I10 Essential (primary) hypertension: Secondary | ICD-10-CM

## 2023-06-26 DIAGNOSIS — M25512 Pain in left shoulder: Secondary | ICD-10-CM

## 2023-06-26 DIAGNOSIS — Z09 Encounter for follow-up examination after completed treatment for conditions other than malignant neoplasm: Secondary | ICD-10-CM

## 2023-06-26 NOTE — Progress Notes (Signed)
 Established Patient Office Visit  Subjective:  Patient ID: Benjamin Conley, male    DOB: 07/06/1965  Age: 58 y.o. MRN: [id number]  CC:  Chief Complaint  Patient presents with   Hospitalization Follow-up   Discussed the use of a AI scribe software for clinical note transcription with the patient, who gave verbal consent to proceed.  HPI  Benjamin Conley presents for hospital follow-up. He recently visited the emergency room due to elevated blood pressure, recorded at approximately 170/107 mmHg. At the ER, his blood pressure decreased to 113 mmHg. Currently, his blood pressure is 120/84 mmHg. He monitors his blood pressure at home, though not consistently.  He experiences shoulder pain described as a burning sensation from the neck to the shoulder, which was constant and began the night before his ER visit. The pain has since subsided. He has a history of shoulder impingement and a torn rotator cuff, diagnosed by an orthopedic specialist. Physical therapy and injections have not been effective in managing his shoulder pain.  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   Tobacco abuse    Upper GI bleed 08/21/2019    Past Surgical History:  Procedure Laterality Date   BACK SURGERY     lumbar   COLONOSCOPY WITH PROPOFOL  N/A 08/24/2019   Procedure: COLONOSCOPY WITH PROPOFOL ;  Surgeon: Marnee Sink, MD;  Location: [facility];  Service: Endoscopy;  Laterality: N/A;   ESOPHAGOGASTRODUODENOSCOPY (EGD) WITH PROPOFOL  N/A 08/24/2019   Procedure: ESOPHAGOGASTRODUODENOSCOPY (EGD) WITH PROPOFOL ;  Surgeon: Marnee Sink, MD;  Location: [facility];  Service: Endoscopy;  Laterality: N/A;   STENT PLACEMENT VASCULAR ([facility] HX)  07/2019   stenosis of distal splenic artery and stent placed   VISCERAL ANGIOGRAPHY N/A 08/06/2019   Procedure: VISCERAL ANGIOGRAPHY;  Surgeon: Jackquelyn Mass, MD;  Location: [facility];  Service: Cardiovascular;  Laterality: N/A;    Family History  Problem Relation Age of Onset   Diabetes Mother    Diabetes Mellitus II Mother    Tuberculosis Father    Heart disease Father    Diabetes Sister    Diabetes Sister    Diabetes Sister    Diabetes Sister    Diabetes Brother    Diabetes Mellitus II Brother    Hyperlipidemia Brother    Cancer Maternal Aunt    Colon cancer Maternal Uncle    Prostate cancer Neg Hx     Social History   Socioeconomic History   Marital status: Single    Spouse name: Not on file   Number of children: Not on file   Years of education: Not on file   Highest education level: Not on file  Occupational History   Not on file  Tobacco Use   Smoking status: Former    Current packs/day: 0.00    Average packs/day: 2.0 packs/day for 48.0 years (96.0 ttl pk-yrs)    Types: Cigarettes    Start date: 02/12/1972    Quit date: 02/12/2020    Years since quitting: 3.4   Smokeless tobacco: Never  Vaping Use   Vaping status: Never Used  Substance and Sexual  Activity   Alcohol use: No    Comment: Never been a problem   Drug use: No   Sexual activity: Yes  Other Topics Concern   Not on file  Social History Narrative   Single, lives alone, unemployed.   Social Drivers of Health   Financial Resource Strain: Medium Risk (06/08/2023)   Overall Financial Resource Strain (CARDIA)    Difficulty of Paying Living Expenses: Somewhat hard  Food Insecurity: Food Insecurity Present (06/08/2023)   Hunger Vital Sign    Worried About Running Out of Food in the Last Year: Never true    Ran Out of Food in the Last Year: Sometimes true  Transportation  Needs: No Transportation Needs (06/08/2023)   PRAPARE - Administrator, Civil Service (Medical): No    Lack of Transportation (Non-Medical): No  Physical Activity: Insufficiently Active (06/08/2023)   Exercise Vital Sign    Days of Exercise per Week: 3 days    Minutes of Exercise per Session: 30 min  Stress: No Stress Concern Present (06/08/2023)   [employer] of Occupational Health - Occupational Stress Questionnaire    Feeling of Stress : Not at all  Social Connections: Unknown (06/08/2023)   Social Connection and Isolation Panel [NHANES]    Frequency of Communication with Friends and Family: Once a week    Frequency of Social Gatherings with Friends and Family: Once a week    Attends Religious Services: Patient declined    Database administrator or Organizations: No    Attends Banker Meetings: Never    Marital Status: Never married  Intimate Partner Violence: Not At Risk (10/29/2022)   Humiliation, Afraid, Rape, and Kick questionnaire    Fear of Current or Ex-Partner: No    Emotionally Abused: No    Physically Abused: No    Sexually Abused: No     Outpatient Medications Prior to Visit  Medication Sig Dispense Refill   aspirin  81 MG EC tablet Take 1 tablet (81 mg total) by mouth daily. 90 tablet 3   atorvastatin  (LIPITOR) 40 MG tablet Take 1 tablet (40 mg total) by mouth daily. 90 tablet 3   blood glucose meter kit and supplies Dispense based on patient and insurance preference. Use up to four times daily as directed. (FOR ICD-10 E10.9, E11.9). 1 each 0   clopidogrel  (PLAVIX ) 75 MG tablet Take 1 tablet by mouth once daily 90 tablet 1   Continuous Glucose Sensor (FREESTYLE LIBRE 3 SENSOR) MISC USE ONE SENSOR EVERY 14 DAYS TO CHECK GLUCOSE CONTINOUSLY 2 each 2   digoxin  (LANOXIN ) 0.125 MG tablet Take 1 tablet (0.125 mg total) by mouth daily. 30 tablet 3   empagliflozin  (JARDIANCE ) 25 MG TABS tablet Take 1 tablet (25 mg total) by mouth daily before  breakfast. 90 tablet 3   famotidine  (PEPCID ) 20 MG tablet Take 1 tablet (20 mg total) by mouth at bedtime. 90 tablet 1   nystatin -triamcinolone  ointment (MYCOLOG) Apply 1 application topically 2 (two) times daily. 60 g 0   Semaglutide , 2 MG/DOSE, (OZEMPIC , 2 MG/DOSE,) 8 MG/3ML SOPN INJECT 2MG  INTO THE SKIN ONCE WEEKLY 9 mL 0   sildenafil  (VIAGRA ) 100 MG tablet Take 1 tablet (100 mg total) by mouth daily as needed for erectile dysfunction. 30 tablet 0   silodosin  (RAPAFLO ) 8 MG CAPS capsule Take 1 capsule (8 mg total) by mouth daily with breakfast. 30 capsule 6   triamcinolone  cream (KENALOG ) 0.1 % Apply 1 application topically 2 (two) times  daily. 30 g 0   No facility-administered medications prior to visit.    Allergies  Allergen Reactions   Wasp Venom Anaphylaxis   Wasp Venom Protein Anaphylaxis   Coffee Flavoring Agent (Non-Screening) Nausea And Vomiting    Patient states coffee gives him nausea and stomach cramps   Onion Nausea And Vomiting    ROS Review of Systems Negative unless indicated in HPI.    Objective:    Physical Exam Constitutional:      Appearance: Normal appearance.  HENT:     Mouth/Throat:     Mouth: Mucous membranes are moist.  Eyes:     Conjunctiva/sclera: Conjunctivae normal.     Pupils: Pupils are equal, round, and reactive to light.  Cardiovascular:     Rate and Rhythm: Normal rate and regular rhythm.     Pulses: Normal pulses.     Heart sounds: Normal heart sounds.  Pulmonary:     Effort: Pulmonary effort is normal.     Breath sounds: Normal breath sounds.  Abdominal:     General: Bowel sounds are normal.     Palpations: Abdomen is soft.  Musculoskeletal:        General: Normal range of motion.     Cervical back: Normal range of motion. No tenderness.  Skin:    General: Skin is warm.     Findings: No bruising.  Neurological:     General: No focal deficit present.     Mental Status: He is alert and oriented to person, place, and time.  Mental status is at baseline.  Psychiatric:        Mood and Affect: Mood normal.        Behavior: Behavior normal.        Thought Content: Thought content normal.        Judgment: Judgment normal.     BP 120/84   Pulse (!) 102   Temp (!) 97.5 F (36.4 C)   Ht 5\' 4"  (1.626 m)   Wt 174 lb (78.9 kg)   SpO2 99%   BMI 29.87 kg/m  Wt Readings from Last 3 Encounters:  06/26/23 174 lb (78.9 kg)  06/22/23 175 lb (79.4 kg)  06/14/23 175 lb (79.4 kg)     Health Maintenance  Topic Date Due   INFLUENZA VACCINE  10/10/2023   Medicare Annual Wellness (AWV)  10/29/2023   HEMOGLOBIN A1C  12/12/2023   Diabetic kidney evaluation - Urine ACR  12/16/2023   OPHTHALMOLOGY EXAM  03/10/2024   FOOT EXAM  06/11/2024   Lung Cancer Screening  06/21/2024   Diabetic kidney evaluation - eGFR measurement  06/21/2024   DTaP/Tdap/Td (3 - Td or Tdap) 08/01/2024   Colonoscopy  08/23/2029   Hepatitis C Screening  Completed   HIV Screening  Completed   HPV VACCINES  Aged Out   Meningococcal B Vaccine  Aged Out   Pneumococcal Vaccine 45-68 Years old  Discontinued   COVID-19 Vaccine  Discontinued   Zoster Vaccines- Shingrix  Discontinued    There are no preventive care reminders to display for this patient.  Lab Results  Component Value Date   TSH 0.63 06/12/2023   Lab Results  Component Value Date   WBC 6.5 06/22/2023   HGB 13.9 06/22/2023   HCT 42.5 06/22/2023   MCV 87.1 06/22/2023   PLT 453 (H) 06/22/2023   Lab Results  Component Value Date   NA 139 06/22/2023   K 3.8 06/22/2023   CO2 21 (L) 06/22/2023  GLUCOSE 173 (H) 06/22/2023   BUN 19 06/22/2023   CREATININE 0.75 06/22/2023   BILITOT 0.6 06/14/2023   ALKPHOS 64 06/14/2023   AST 19 06/14/2023   ALT 20 06/14/2023   PROT 7.4 06/14/2023   ALBUMIN 4.0 06/14/2023   CALCIUM  9.5 06/22/2023   ANIONGAP 12 06/22/2023   EGFR 105 02/23/2021   GFR 99.68 06/12/2023   Lab Results  Component Value Date   CHOL 133 06/12/2023   Lab  Results  Component Value Date   HDL 43.30 06/12/2023   Lab Results  Component Value Date   LDLCALC 77 06/12/2023   Lab Results  Component Value Date   TRIG 65.0 06/12/2023   Lab Results  Component Value Date   CHOLHDL 3 06/12/2023   Lab Results  Component Value Date   HGBA1C 7.3 (A) 06/12/2023      Assessment & Plan:  Left shoulder pain, unspecified chronicity Assessment & Plan: Shoulder pain resolved. Declines further therapy or surgery. - Monitor shoulder pain and function.   Primary hypertension Assessment & Plan: Blood pressure  120/84.  - Provide paper for consistent blood pressure recording. - Send readings via MyChart in two weeks. -Will continue to monitor.     Follow-up: No follow-ups on file.   Desean Heemstra, NP

## 2023-06-26 NOTE — Patient Instructions (Signed)
 Please monitor your blood pressure 2-3 times a week at the same time and send us  the number via MyChart in 2 weeks.

## 2023-06-27 ENCOUNTER — Encounter
Admission: RE | Admit: 2023-06-27 | Discharge: 2023-06-27 | Disposition: A | Discharge status: Home / Self Care | Source: Ambulatory Visit | Attending: Family | Admitting: Family

## 2023-06-27 DIAGNOSIS — K3184 Gastroparesis: Secondary | ICD-10-CM | POA: Diagnosis not present

## 2023-06-27 DIAGNOSIS — R109 Unspecified abdominal pain: Secondary | ICD-10-CM | POA: Diagnosis not present

## 2023-06-27 DIAGNOSIS — R1310 Dysphagia, unspecified: Secondary | ICD-10-CM | POA: Insufficient documentation

## 2023-06-27 DIAGNOSIS — K219 Gastro-esophageal reflux disease without esophagitis: Secondary | ICD-10-CM | POA: Diagnosis not present

## 2023-06-27 MED ORDER — TECHNETIUM TC 99M SULFUR COLLOID
2.3100 | Freq: Once | INTRAVENOUS | Status: AC | PRN
  Administered 2023-06-27: 2.31 via ORAL

## 2023-06-30 ENCOUNTER — Encounter: Payer: Self-pay | Admitting: Family

## 2023-07-02 ENCOUNTER — Encounter (INDEPENDENT_AMBULATORY_CARE_PROVIDER_SITE_OTHER): Payer: Self-pay

## 2023-07-03 ENCOUNTER — Encounter: Payer: Self-pay | Admitting: Family

## 2023-07-06 ENCOUNTER — Encounter: Payer: Self-pay | Admitting: Family

## 2023-07-07 DIAGNOSIS — M25512 Pain in left shoulder: Secondary | ICD-10-CM | POA: Insufficient documentation

## 2023-07-07 NOTE — Telephone Encounter (Signed)
 Spoke to pt informed him that  no place on form is for pcp to sign we just need to draw up letter and have pcp sign and he can come pick up

## 2023-07-07 NOTE — Assessment & Plan Note (Signed)
 Blood pressure  120/84.  - Provide paper for consistent blood pressure recording. - Send readings via MyChart in two weeks. -Will continue to monitor.

## 2023-07-07 NOTE — Assessment & Plan Note (Signed)
 Shoulder pain resolved. Declines further therapy or surgery. - Monitor shoulder pain and function.

## 2023-07-07 NOTE — Telephone Encounter (Signed)
 Pt has been made aware.

## 2023-07-07 NOTE — Telephone Encounter (Signed)
 Letter drawn up pt has been notified to come and pick up in office, placed upfront in brown folder awaiting pt pickup

## 2023-07-08 ENCOUNTER — Other Ambulatory Visit: Payer: Self-pay | Admitting: Family

## 2023-07-08 DIAGNOSIS — R000 Tachycardia, unspecified: Secondary | ICD-10-CM

## 2023-07-08 MED ORDER — DIGOXIN 125 MCG PO TABS: 0.2500 mg | ORAL_TABLET | Freq: Every day | ORAL | 3 refills | Status: DC

## 2023-07-10 ENCOUNTER — Telehealth: Payer: Self-pay

## 2023-07-10 NOTE — Telephone Encounter (Signed)
 Copied from CRM [id number]. Topic: Clinical - Medication Question >> Jul 09, 2023  5:06 PM DeAngela L wrote: Reason for CRM: [facility] would like to ask if the office would like the patient to start out taking 1 pill instead of 2 digoxin  (LANOXIN ) 0.125 MG tablet  the concerns about interacting with another medication for his blood pressure,(Patient states the medication he is taking and it's not listed on his current medication list but the pharmacy is aware of him taking this additional medication and thinks the medications will interact with each other) Patient number [phone] (M)

## 2023-07-10 NOTE — Telephone Encounter (Signed)
 Call pt  He sent mychart message regarding chest pain and I am very concerned  He would need to be seen in ED for chest pain.  Please triage for sob, HA, vision changes  I did not realize that he was not taking digoxin  0.125mg  every day.   I have asked Dr Debra Familia ( cardiology) if he would advise restarting digoxin  0.125mg  versus increasing dose. I will get back to patient after I here from him. Advise pt NOT to pick up digoxin  prescription until I have heard from Dr Debra Familia  Triage CP

## 2023-07-10 NOTE — Telephone Encounter (Signed)
 Spoke to pt he stated that he is not having the CP  no sob, HA anymore but if he starts back having it he will go to ED . Pt will not take the Dijoxin until he hears back from us  per Dr Debra Familia

## 2023-07-11 ENCOUNTER — Encounter: Payer: Self-pay | Admitting: Oncology

## 2023-07-12 ENCOUNTER — Other Ambulatory Visit: Payer: Self-pay | Admitting: Family

## 2023-07-12 DIAGNOSIS — R000 Tachycardia, unspecified: Secondary | ICD-10-CM

## 2023-07-12 MED ORDER — DIGOXIN 125 MCG PO TABS: 0.1250 mg | ORAL_TABLET | Freq: Every day | ORAL | Status: DC

## 2023-07-12 NOTE — Telephone Encounter (Signed)
 noted

## 2023-07-16 ENCOUNTER — Encounter: Payer: Self-pay | Admitting: Family

## 2023-07-17 ENCOUNTER — Encounter (HOSPITAL_COMMUNITY): Payer: Self-pay

## 2023-07-18 ENCOUNTER — Ambulatory Visit (INDEPENDENT_AMBULATORY_CARE_PROVIDER_SITE_OTHER): Payer: 59

## 2023-07-18 ENCOUNTER — Ambulatory Visit (INDEPENDENT_AMBULATORY_CARE_PROVIDER_SITE_OTHER): Payer: 59 | Admitting: Nurse Practitioner

## 2023-07-18 ENCOUNTER — Encounter (INDEPENDENT_AMBULATORY_CARE_PROVIDER_SITE_OTHER): Payer: Self-pay | Admitting: Nurse Practitioner

## 2023-07-18 VITALS — BP 121/82 | HR 92 | Resp 16 | Wt 174.0 lb

## 2023-07-18 DIAGNOSIS — I10 Essential (primary) hypertension: Secondary | ICD-10-CM | POA: Diagnosis not present

## 2023-07-18 DIAGNOSIS — I739 Peripheral vascular disease, unspecified: Secondary | ICD-10-CM | POA: Diagnosis not present

## 2023-07-18 DIAGNOSIS — I748 Embolism and thrombosis of other arteries: Secondary | ICD-10-CM | POA: Diagnosis not present

## 2023-07-18 DIAGNOSIS — E119 Type 2 diabetes mellitus without complications: Secondary | ICD-10-CM | POA: Diagnosis not present

## 2023-07-20 ENCOUNTER — Encounter (INDEPENDENT_AMBULATORY_CARE_PROVIDER_SITE_OTHER): Payer: Self-pay | Admitting: Nurse Practitioner

## 2023-07-20 NOTE — Progress Notes (Signed)
 Subjective:    Patient ID: Benjamin Conley, male    DOB: 06-17-1965, 58 y.o.   MRN: [id number] Chief Complaint  Patient presents with   Follow-up    84yr abi and mesenteric     Patient returns today in follow up of multiple vascular issues.  About 3 years ago he had treatment for splenic artery thrombosis.  The splenic artery was able to be revascularized and a stent was placed.  He currently has no abdominal pain or symptoms currently.  Today his noninvasive studies show normal findings in the celiac, mesenteric, splenic and hepatic arteries.  Widely patent stent in the splenic artery.  He also has some neuropathic pain in his lower extremities.  Today his ABIs are noncompressible but he has a TBI 0.85 on the right and 0.94 on the left with multiphasic tibial waveforms and normal toe waveforms bilaterally which are consistent with his studies from last year.       Review of Systems  All other systems reviewed and are negative.      Objective:    Physical Exam Vitals reviewed.  HENT:     Head: Normocephalic.  Cardiovascular:     Rate and Rhythm: Normal rate.     Pulses:          Dorsalis pedis pulses are detected w/ Doppler on the right side and detected w/ Doppler on the left side.       Posterior tibial pulses are detected w/ Doppler on the right side and detected w/ Doppler on the left side.  Pulmonary:     Effort: Pulmonary effort is normal.  Skin:    General: Skin is warm and dry.  Neurological:     Mental Status: He is alert and oriented to person, place, and time.  Psychiatric:        Mood and Affect: Mood normal.        Behavior: Behavior normal.        Thought Content: Thought content normal.        Judgment: Judgment normal.     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   Tobacco abuse    Upper GI bleed 08/21/2019    Social History   Socioeconomic History   Marital status: Single    Spouse name: Not on file   Number of children: Not on file   Years of education: Not on file   Highest education level: Not on file  Occupational History   Not on file  Tobacco Use   Smoking status: Former    Current packs/day: 0.00    Average packs/day: 2.0 packs/day for 48.0 years (96.0 ttl pk-yrs)    Types: Cigarettes    Start date: 02/12/1972    Quit date: 02/12/2020    Years since quitting: 3.4   Smokeless tobacco: Never  Vaping Use   Vaping status: Never Used  Substance and Sexual Activity   Alcohol use: No    Comment: Never been a problem  Drug use: No   Sexual activity: Yes  Other Topics Concern   Not on file  Social History Narrative   Single, lives alone, unemployed.   Social Drivers of Health   Financial Resource Strain: Medium Risk (06/08/2023)   Overall Financial Resource Strain (CARDIA)    Difficulty of Paying Living Expenses: Somewhat hard  Food Insecurity: Food Insecurity Present (06/08/2023)   Hunger Vital Sign    Worried About Running Out of Food in the Last Year: Never true    Ran Out of Food in the Last Year: Sometimes true  Transportation Needs: No Transportation Needs (06/08/2023)   PRAPARE - Administrator, Civil Service (Medical): No    Lack of Transportation (Non-Medical): No  Physical Activity: Insufficiently Active (06/08/2023)   Exercise Vital Sign    Days of Exercise per Week: 3 days    Minutes of Exercise per Session: 30 min  Stress: No Stress Concern Present (06/08/2023)   [employer] of Occupational Health - Occupational Stress Questionnaire     Feeling of Stress : Not at all  Social Connections: Unknown (06/08/2023)   Social Connection and Isolation Panel [NHANES]    Frequency of Communication with Friends and Family: Once a week    Frequency of Social Gatherings with Friends and Family: Once a week    Attends Religious Services: Patient declined    Database administrator or Organizations: No    Attends Banker Meetings: Never    Marital Status: Never married  Intimate Partner Violence: Not At Risk (10/29/2022)   Humiliation, Afraid, Rape, and Kick questionnaire    Fear of Current or Ex-Partner: No    Emotionally Abused: No    Physically Abused: No    Sexually Abused: No    Past Surgical History:  Procedure Laterality Date   BACK SURGERY     lumbar   COLONOSCOPY WITH PROPOFOL  N/A 08/24/2019   Procedure: COLONOSCOPY WITH PROPOFOL ;  Surgeon: Marnee Sink, MD;  Location: [facility];  Service: Endoscopy;  Laterality: N/A;   ESOPHAGOGASTRODUODENOSCOPY (EGD) WITH PROPOFOL  N/A 08/24/2019   Procedure: ESOPHAGOGASTRODUODENOSCOPY (EGD) WITH PROPOFOL ;  Surgeon: Marnee Sink, MD;  Location: [facility];  Service: Endoscopy;  Laterality: N/A;   STENT PLACEMENT VASCULAR ([facility] HX)  07/2019   stenosis of distal splenic artery and stent placed   VISCERAL ANGIOGRAPHY N/A 08/06/2019   Procedure: VISCERAL ANGIOGRAPHY;  Surgeon: Jackquelyn Mass, MD;  Location: [facility];  Service: Cardiovascular;  Laterality: N/A;    Family History  Problem Relation Age of Onset   Diabetes Mother    Diabetes Mellitus II Mother    Tuberculosis Father    Heart disease Father    Diabetes Sister    Diabetes Sister    Diabetes Sister    Diabetes Sister    Diabetes Brother    Diabetes Mellitus II Brother    Hyperlipidemia Brother    Cancer Maternal Aunt    Colon cancer Maternal Uncle    Prostate cancer Neg Hx     Allergies  Allergen Reactions   Wasp Venom Anaphylaxis   Wasp Venom Protein Anaphylaxis   Coffee Flavoring  Agent (Non-Screening) Nausea And Vomiting    Patient states coffee gives him nausea and stomach cramps   Onion Nausea And Vomiting       Latest Ref Rng & Units 06/22/2023   12:23 PM 06/14/2023    8:17 PM 06/12/2023   11:10 AM  CBC  WBC 4.0 - 10.5 K/uL  6.5  7.3  6.3   Hemoglobin 13.0 - 17.0 g/dL 16.1  09.6  04.5   Hematocrit 39.0 - 52.0 % 42.5  40.2  41.9   Platelets 150 - 400 K/uL 453  406  393.0        CMP     Component Value Date/Time   NA 139 06/22/2023 1223   NA 137 02/23/2021 1531   NA 137 08/30/2013 2322   K 3.8 06/22/2023 1223   K 3.5 08/30/2013 2322   CL 106 06/22/2023 1223   CL 101 08/30/2013 2322   CO2 21 (L) 06/22/2023 1223   CO2 23 08/30/2013 2322   GLUCOSE 173 (H) 06/22/2023 1223   GLUCOSE 374 (H) 08/30/2013 2322   BUN 19 06/22/2023 1223   BUN 13 02/23/2021 1531   BUN 13 08/30/2013 2322   CREATININE 0.75 06/22/2023 1223   CREATININE 0.66 (L) 01/21/2020 1505   CALCIUM  9.5 06/22/2023 1223   CALCIUM  9.1 08/30/2013 2322   PROT 7.4 06/14/2023 2017   PROT 7.0 02/23/2021 1531   ALBUMIN 4.0 06/14/2023 2017   ALBUMIN 4.5 02/23/2021 1531   AST 19 06/14/2023 2017   ALT 20 06/14/2023 2017   ALKPHOS 64 06/14/2023 2017   BILITOT 0.6 06/14/2023 2017   BILITOT 0.4 02/23/2021 1531   GFR 99.68 06/12/2023 1110   EGFR 105 02/23/2021 1531   GFRNONAA >60 06/22/2023 1223   GFRNONAA >60 08/30/2013 2322     No results found.     Assessment & Plan:   1. PAD (peripheral artery disease) (HCC) Patient studies are largely unchanged from last year.  No development of any significant claudication or rest pain like symptoms.  He continues to have neuropathic issues but this, we do not feel this is inherently caused by his current PAD.  Patient will continue with 1 year follow-up.  2. Diabetes mellitus without complication (HCC) (Primary) Continue hypoglycemic medications as already ordered, these medications have been reviewed and there are no changes at this time.  Hgb A1C  to be monitored as already arranged by primary service  3. Thrombosis of splenic artery (HCC) His mesenteric duplex today shows normal findings in the celiac artery and branches including the splenic artery and normal flow in the superior mesenteric arteries well.  Doing well.  No change in his regimen.  Recheck in 1 year.   4. Primary hypertension Continue antihypertensive medications as already ordered, these medications have been reviewed and there are no changes at this time.   Current Outpatient Medications on File Prior to Visit  Medication Sig Dispense Refill   aspirin  81 MG EC tablet Take 1 tablet (81 mg total) by mouth daily. 90 tablet 3   atorvastatin  (LIPITOR) 40 MG tablet Take 1 tablet (40 mg total) by mouth daily. 90 tablet 3   blood glucose meter kit and supplies Dispense based on patient and insurance preference. Use up to four times daily as directed. (FOR ICD-10 E10.9, E11.9). 1 each 0   clopidogrel  (PLAVIX ) 75 MG tablet Take 1 tablet by mouth once daily 90 tablet 1   Continuous Glucose Sensor (FREESTYLE LIBRE 3 SENSOR) MISC USE ONE SENSOR EVERY 14 DAYS TO CHECK GLUCOSE CONTINOUSLY 2 each 2   digoxin  (LANOXIN ) 0.125 MG tablet Take 1 tablet (0.125 mg total) by mouth daily.     empagliflozin  (JARDIANCE ) 25 MG TABS tablet Take 1 tablet (25 mg total) by mouth daily before breakfast. 90 tablet 3   famotidine  (PEPCID ) 20 MG tablet Take 1  tablet (20 mg total) by mouth at bedtime. 90 tablet 1   nystatin -triamcinolone  ointment (MYCOLOG) Apply 1 application topically 2 (two) times daily. 60 g 0   Semaglutide , 2 MG/DOSE, (OZEMPIC , 2 MG/DOSE,) 8 MG/3ML SOPN INJECT 2MG  INTO THE SKIN ONCE WEEKLY 9 mL 0   sildenafil  (VIAGRA ) 100 MG tablet Take 1 tablet (100 mg total) by mouth daily as needed for erectile dysfunction. 30 tablet 0   silodosin  (RAPAFLO ) 8 MG CAPS capsule Take 1 capsule (8 mg total) by mouth daily with breakfast. 30 capsule 6   triamcinolone  cream (KENALOG ) 0.1 % Apply 1  application topically 2 (two) times daily. 30 g 0   No current facility-administered medications on file prior to visit.    There are no Patient Instructions on file for this visit. No follow-ups on file.   Aamirah Salmi E Leonore Frankson, NP

## 2023-07-23 LAB — VAS US ABI WITH/WO TBI

## 2023-07-24 ENCOUNTER — Encounter: Payer: Self-pay | Admitting: Family

## 2023-07-24 ENCOUNTER — Ambulatory Visit: Admitting: Family

## 2023-07-24 VITALS — BP 118/80 | HR 97 | Temp 97.5°F | Ht 64.0 in | Wt 173.6 lb

## 2023-07-24 DIAGNOSIS — E538 Deficiency of other specified B group vitamins: Secondary | ICD-10-CM

## 2023-07-24 DIAGNOSIS — R000 Tachycardia, unspecified: Secondary | ICD-10-CM

## 2023-07-24 DIAGNOSIS — Z7984 Long term (current) use of oral hypoglycemic drugs: Secondary | ICD-10-CM | POA: Diagnosis not present

## 2023-07-24 DIAGNOSIS — I6782 Cerebral ischemia: Secondary | ICD-10-CM

## 2023-07-24 DIAGNOSIS — Z7985 Long-term (current) use of injectable non-insulin antidiabetic drugs: Secondary | ICD-10-CM | POA: Diagnosis not present

## 2023-07-24 DIAGNOSIS — E119 Type 2 diabetes mellitus without complications: Secondary | ICD-10-CM | POA: Diagnosis not present

## 2023-07-24 NOTE — Assessment & Plan Note (Signed)
 Chronic , stable based on current GMI. Continue jardiance  25mg  every day, ozempic  2mg . Discussed importance of glycemic control in regards to thwarting progression of cerebral ischemic diease.

## 2023-07-24 NOTE — Patient Instructions (Addendum)
 We will focus on diabetes, cholesterol, blood pressure check for progression of chronic cerebral microvascular ischemia.  Look forward to seeing you at follow up.

## 2023-07-24 NOTE — Assessment & Plan Note (Signed)
 Improved. Continue digoxin  as managed by cardiology. Will follow

## 2023-07-24 NOTE — Progress Notes (Signed)
 Assessment & Plan:  Cerebral ischemia Assessment & Plan: Reviewed MRI Brain from 2021.  Longstanding DM , h/o PAD. Former smoker. Discussed exercise , social engagement.  Continued optimized management of HLD, HTN, DM.     Diabetes mellitus without complication (HCC) Assessment & Plan: Chronic , stable based on current GMI. Continue jardiance  25mg  every day, ozempic  2mg . Discussed importance of glycemic control in regards to thwarting progression of cerebral ischemic diease.     Tachycardia Assessment & Plan: Improved. Continue digoxin  as managed by cardiology. Will follow      Return precautions given.   Risks, benefits, and alternatives of the medications and treatment plan prescribed today were discussed, and patient expressed understanding.   Education regarding symptom management and diagnosis given to patient on AVS either electronically or printed.  Return in about 2 months (around 09/23/2023).  Bascom Bossier, [name]  Subjective:    Patient ID: Benjamin Conley, male    DOB: Jul 25, 1965, 58 y.o.   MRN: [id number]  CC: Benjamin Conley is a 58 y.o. male who presents today for follow up.   HPI: Overall feels well today No new complaints  He was reviewing MRI brain 2021 and concerned in regards to ischemia noted on MRI.     HR is staying 99, and hasn't seen a HR of 105  Dizziness is less frequent now that he has started digoxin  0.125mg  every day.   Denies syncope, fall.   Follow up with cardiology 08/2023, Dr Debra Familia   GMI 7.1 over the 90 days  MRI brain 08/20/2019  No acute intracranial abnormality. 2. Periventricular and scattered subcortical T2 hyperintensities bilaterally are moderately advanced for age. The finding is nonspecific but can be seen in the setting of chronic microvascular ischemia, a demyelinating process such as multiple sclerosis, vasculitis, complicated migraine headaches, or as the sequelae of a prior infectious or inflammatory  process.  Dx diabetes 2007.  He lives alone.  Allergies: Wasp venom, Wasp venom protein, Coffee flavoring agent (non-screening), and Onion Current Outpatient Medications on File Prior to Visit  Medication Sig Dispense Refill   aspirin  81 MG EC tablet Take 1 tablet (81 mg total) by mouth daily. 90 tablet 3   atorvastatin  (LIPITOR) 40 MG tablet Take 1 tablet (40 mg total) by mouth daily. 90 tablet 3   blood glucose meter kit and supplies Dispense based on patient and insurance preference. Use up to four times daily as directed. (FOR ICD-10 E10.9, E11.9). 1 each 0   clopidogrel  (PLAVIX ) 75 MG tablet Take 1 tablet by mouth once daily 90 tablet 1   Continuous Glucose Sensor (FREESTYLE LIBRE 3 SENSOR) MISC USE ONE SENSOR EVERY 14 DAYS TO CHECK GLUCOSE CONTINOUSLY 2 each 2   digoxin  (LANOXIN ) 0.125 MG tablet Take 1 tablet (0.125 mg total) by mouth daily.     empagliflozin  (JARDIANCE ) 25 MG TABS tablet Take 1 tablet (25 mg total) by mouth daily before breakfast. 90 tablet 3   famotidine  (PEPCID ) 20 MG tablet Take 1 tablet (20 mg total) by mouth at bedtime. 90 tablet 1   nystatin -triamcinolone  ointment (MYCOLOG) Apply 1 application topically 2 (two) times daily. 60 g 0   Semaglutide , 2 MG/DOSE, (OZEMPIC , 2 MG/DOSE,) 8 MG/3ML SOPN INJECT 2MG  INTO THE SKIN ONCE WEEKLY 9 mL 0   sildenafil  (VIAGRA ) 100 MG tablet Take 1 tablet (100 mg total) by mouth daily as needed for erectile dysfunction. 30 tablet 0   silodosin  (RAPAFLO ) 8 MG CAPS capsule Take 1 capsule (8  mg total) by mouth daily with breakfast. 30 capsule 6   triamcinolone  cream (KENALOG ) 0.1 % Apply 1 application topically 2 (two) times daily. 30 g 0   No current facility-administered medications on file prior to visit.    Review of Systems  Constitutional:  Negative for chills and fever.  Respiratory:  Negative for cough.   Cardiovascular:  Negative for chest pain and palpitations.  Gastrointestinal:  Negative for nausea and vomiting.   Neurological:  Negative for dizziness (improved).      Objective:    BP 118/80   Pulse 97   Temp (!) 97.5 F (36.4 C) (Oral)   Ht 5\' 4"  (1.626 m)   Wt 173 lb 9.6 oz (78.7 kg)   SpO2 96%   BMI 29.80 kg/m  BP Readings from Last 3 Encounters:  07/24/23 118/80  07/18/23 121/82  06/26/23 120/84   Wt Readings from Last 3 Encounters:  07/24/23 173 lb 9.6 oz (78.7 kg)  07/18/23 174 lb (78.9 kg)  06/26/23 174 lb (78.9 kg)    Physical Exam Vitals reviewed.  Constitutional:      Appearance: He is well-developed.  Cardiovascular:     Rate and Rhythm: Regular rhythm.     Heart sounds: Normal heart sounds.  Pulmonary:     Effort: Pulmonary effort is normal. No respiratory distress.     Breath sounds: Normal breath sounds. No wheezing, rhonchi or rales.  Skin:    General: Skin is warm and dry.  Neurological:     Mental Status: He is alert.  Psychiatric:        Speech: Speech normal.        Behavior: Behavior normal.

## 2023-07-24 NOTE — Assessment & Plan Note (Addendum)
 Reviewed MRI Brain from 2021.  Longstanding DM , h/o PAD. Former smoker. Discussed exercise , social engagement.  Continued optimized management of HLD, HTN, DM.

## 2023-07-25 NOTE — Telephone Encounter (Signed)
 Lab ordered pt scheduled

## 2023-07-27 ENCOUNTER — Encounter: Payer: Self-pay | Admitting: Family

## 2023-07-28 ENCOUNTER — Encounter: Payer: Self-pay | Admitting: Family

## 2023-07-28 ENCOUNTER — Other Ambulatory Visit (INDEPENDENT_AMBULATORY_CARE_PROVIDER_SITE_OTHER)

## 2023-07-28 ENCOUNTER — Telehealth: Payer: Self-pay | Admitting: Cardiology

## 2023-07-28 DIAGNOSIS — E538 Deficiency of other specified B group vitamins: Secondary | ICD-10-CM

## 2023-07-28 NOTE — Telephone Encounter (Signed)
 Called Dr Debra Familia office and spoke with April and relayed message below that pt needs to be triaged for Tachycardia, and see if Dr Debra Familia would lie to increase his Digoxin   she stated that she would send message to him ad someone would reach back out to our office. Spoke to pt also he stated that he is not having any CP SOB at this time, he stated that yesterday afternoon after he sent message regarding pulse  rate increases, he did have som e cp discomfort but not enough to warrant an ED visit. But states that if hr has any more cp he would got to ED    Right after coming BP was 104/68 pulse 113  After a few minutes 121/79 and pulse 110 Then it went to 132/80 pulse 121  Went back out to finish weed eating BP jumped to right after 140/89 pulse 114 a few.minuts later  121/75 pulse 109 and a few minutes later BP 118/71 pulse 107 BP went back to 120/75 pulse 106

## 2023-07-28 NOTE — Telephone Encounter (Signed)
 Pt scheduled for 07/31/23

## 2023-07-28 NOTE — Telephone Encounter (Signed)
 PCP office calling to report BP reading per message they rec'vd from pt, they are wanting to know if digoxin  needs to be adjusted. They will call pt to triage. 5/18- 104/68 hr 113 121/79 hr 120 132/80 hr 121 140/89 hr 114 weed eating outside 121/75 hr 109 118/71 hr 107 120/75 hr 106

## 2023-07-28 NOTE — Telephone Encounter (Signed)
 Called patient about BP and HR. He said during the time his HR was increased that he was outside doing yard work. His BP seemed to be okay. No symptoms reported with the high heart rate, such as shob or dizziness. I asked him if he got his digoxin  levels checked, since this can affect the effectiveness and accuracy of digoxin  dosing. He said his heart rate now is 108 but he has not taken his medication. He said that yesterday (5/18) he had a moment where he experienced some chest pain from right to left side, that radiated to his left arm that felt like a burning sensation. I looked at open appointments for Dr Junnie Olives or other APP's and agbor had a appt for this Wednesday. I scheduled patient to come in and be seen since he had the chest discomfort and will need an EKG and maybe some medication adjustments

## 2023-07-29 ENCOUNTER — Encounter: Payer: Self-pay | Admitting: Cardiology

## 2023-07-29 ENCOUNTER — Encounter: Payer: Self-pay | Admitting: Family

## 2023-07-29 ENCOUNTER — Encounter (INDEPENDENT_AMBULATORY_CARE_PROVIDER_SITE_OTHER): Payer: Self-pay

## 2023-07-29 LAB — B12 AND FOLATE PANEL
Folate: 8.3 ng/mL (ref >5.9)
Vitamin B-12: 264 pg/mL (ref 211–911)

## 2023-07-30 ENCOUNTER — Ambulatory Visit: Attending: Cardiology | Admitting: Cardiology

## 2023-07-30 ENCOUNTER — Encounter: Payer: Self-pay | Admitting: Cardiology

## 2023-07-30 VITALS — BP 130/80 | HR 97 | Ht 64.0 in | Wt 169.5 lb

## 2023-07-30 DIAGNOSIS — E782 Mixed hyperlipidemia: Secondary | ICD-10-CM

## 2023-07-30 DIAGNOSIS — R079 Chest pain, unspecified: Secondary | ICD-10-CM | POA: Diagnosis not present

## 2023-07-30 DIAGNOSIS — I4711 Inappropriate sinus tachycardia, so stated: Secondary | ICD-10-CM

## 2023-07-30 DIAGNOSIS — R000 Tachycardia, unspecified: Secondary | ICD-10-CM

## 2023-07-30 MED ORDER — DIGOXIN 250 MCG PO TABS: 0.2500 mg | ORAL_TABLET | Freq: Every day | ORAL | 3 refills | Status: DC

## 2023-07-30 NOTE — Patient Instructions (Signed)
 Medication Instructions:  Your physician recommends the following medication changes.  INCREASE: Digoxin  to 0.25 mg daily   *If you need a refill on your cardiac medications before your next appointment, please call your pharmacy*  Lab Work: No labs ordered today  If you have labs (blood work) drawn today and your tests are completely normal, you will receive your results only by: MyChart Message (if you have MyChart) OR A paper copy in the mail If you have any lab test that is abnormal or we need to change your treatment, we will call you to review the results.  Testing/Procedures: Your provider has ordered a Lexiscan / Exercise Myoview  Stress test. This will take place at [facility]. Please report to the [facility] medical mall entrance. The volunteers at the first desk will direct you where to go.  [facility] MYOVIEW   Your provider has ordered a Stress Test with nuclear imaging. The purpose of this test is to evaluate the blood supply to your heart muscle. This procedure is referred to as a "Non-Invasive Stress Test." This is because other than having an IV started in your vein, nothing is inserted or "invades" your body. Cardiac stress tests are done to find areas of poor blood flow to the heart by determining the extent of coronary artery disease (CAD). Some patients exercise on a treadmill, which naturally increases the blood flow to your heart, while others who are unable to walk on a treadmill due to physical limitations will have a pharmacologic/chemical stress agent called Lexiscan  . This medicine will mimic walking on a treadmill by temporarily increasing your coronary blood flow.   Please note: these test may take anywhere between 2-4 hours to complete  How to prepare for your Myoview  test:  Nothing to eat for 6 hours prior to the test No caffeine for 24 hours prior to test No smoking 24 hours prior to test. Your medication may be taken with water.  If your doctor stopped a medication because of  this test, do not take that medication. Ladies, please do not wear dresses.  Skirts or pants are appropriate. Please wear a short sleeve shirt. No perfume, cologne or lotion. Wear comfortable walking shoes. No heels!   PLEASE NOTIFY THE OFFICE AT LEAST 24 HOURS IN ADVANCE IF YOU ARE UNABLE TO KEEP YOUR APPOINTMENT.  [phone] AND  PLEASE NOTIFY NUCLEAR MEDICINE AT [facility] AT LEAST 24 HOURS IN ADVANCE IF YOU ARE UNABLE TO KEEP YOUR APPOINTMENT. [phone]   Follow-Up: At [facility], you and your health needs are our priority.  As part of our continuing mission to provide you with exceptional heart care, our providers are all part of one team.  This team includes your primary Cardiologist (physician) and Advanced Practice Providers or APPs (Physician Assistants and Nurse Practitioners) who all work together to provide you with the care you need, when you need it.  Your next appointment:   3 month(s)  Provider:   You may see Constancia Delton, MD or one of the following Advanced Practice Providers on your designated Care Team:   Laneta Pintos, NP Gildardo Labrador, PA-C Varney Gentleman, PA-C [facility], PA-C Ronald Cockayne, NP Morey Ar, NP    We recommend signing up for the patient portal called "MyChart".  Sign up information is provided on this After Visit Summary.  MyChart is used to connect with patients for Virtual Visits (Telemedicine).  Patients are able to view lab/test results, encounter notes, upcoming appointments, etc.  Non-urgent messages can be sent to  your provider as well.   To learn more about what you can do with MyChart, go to [email or web address].

## 2023-07-30 NOTE — Progress Notes (Signed)
 Cardiology Office Note:    Date:  07/30/2023   ID:  Benjamin Conley, DOB 1965-06-27, MRN [id number]  PCP:  Calista Catching, [name]  [facility] Cardiologist:  Constancia Delton, MD  [facility] Electrophysiologist:  None   Referring MD: Calista Catching, [name]   Chief Complaint  Patient presents with   New Patient (Initial Visit)    Ref by Bascom Bossier, [name] for chest pain, tachycardia and HTN.  Patient c/o chest pain that radiated under his left armpit down the backside of his left arm on Monday, yesterday morning, he woke up with chest fullness and high blood pressure.    History of Present Illness:    Benjamin Conley is a 58 y.o. male with a hx of diabetes, orthostatic hypotension, inappropriate sinus tach, splenic infarct s/p stent placement 2021, COPD, hyperlipidemia, former smoker x40+ years who presents due to tachycardia and chest pain.  Patient was working in his yard 4 days ago when he had symptoms of right-sided chest pain radiating to left side and down his left arm.  States his left arm felt like it was burning.  Symptoms lasted a couple of minutes before easing off.  Has not had any episodes since.  States his heart rate at home has been in the low 100s.  BP was also elevated around 150s.  He took his cardiac medications and BP eventually came down.   Prior notes Echo 09/2019 normal systolic and diastolic function, EF 55 to 60%. Lexiscan  Myoview  08/2019 low risk, no evidence for ischemia, no significant coronary artery calcification Cardiac monitor on May/2022, no significant arrhythmias.  Patient had worsening left upper quadrant abdominal pain.  He presented to the ED  where CT abdomen dated 07/2019 showed distal splenic aneurysm no greater than 6 mm, small dissection, likely source of splenic infarctions.  He was diagnosed with splenic infarct and had a stent placed to the splenic artery.    Denies palpitations or irregular heartbeats.  States his heart rates  have been elevated over the past 2 to 3 weeks, up to 120 bpm upon checks at home.   Past Medical History:  Diagnosis Date   Acute posthemorrhagic anemia    Complication of anesthesia    c/o difficulty breathing after anesthesia   COPD (chronic obstructive pulmonary disease) (HCC)    Diabetes mellitus without complication (HCC)    Hyperlipemia    Melena 08/21/2019   Paroxysmal supraventricular tachycardia (HCC) 08/19/2019   Postural dizziness with presyncope 08/21/2019   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   Tobacco abuse    Upper GI bleed 08/21/2019    Past Surgical History:  Procedure Laterality Date   BACK SURGERY     lumbar   COLONOSCOPY WITH PROPOFOL  N/A 08/24/2019   Procedure: COLONOSCOPY WITH PROPOFOL ;  Surgeon: Marnee Sink, MD;  Location: [facility] ENDOSCOPY;  Service: Endoscopy;  Laterality: N/A;   ESOPHAGOGASTRODUODENOSCOPY (EGD) WITH PROPOFOL  N/A 08/24/2019   Procedure: ESOPHAGOGASTRODUODENOSCOPY (EGD) WITH PROPOFOL ;  Surgeon: Marnee Sink, MD;  Location: [facility] ENDOSCOPY;  Service: Endoscopy;  Laterality: N/A;   STENT PLACEMENT VASCULAR ([facility] HX)  07/2019   stenosis of distal splenic artery and stent placed   VISCERAL ANGIOGRAPHY N/A 08/06/2019   Procedure: VISCERAL ANGIOGRAPHY;  Surgeon: Jackquelyn Mass, MD;  Location: [facility] INVASIVE CV LAB;  Service: Cardiovascular;  Laterality: N/A;    Current Medications: Current Meds  Medication Sig   aspirin  81 MG EC tablet Take 1 tablet (81 mg total) by mouth daily.   atorvastatin  (LIPITOR)  40 MG tablet Take 1 tablet (40 mg total) by mouth daily.   blood glucose meter kit and supplies Dispense based on patient and insurance preference. Use up to four times daily as directed. (FOR ICD-10 E10.9, E11.9).   clopidogrel  (PLAVIX ) 75 MG tablet Take 1 tablet by mouth once daily   Continuous Glucose Sensor (FREESTYLE LIBRE 3 SENSOR) MISC USE ONE SENSOR EVERY 14 DAYS TO CHECK GLUCOSE CONTINOUSLY   empagliflozin  (JARDIANCE ) 25 MG TABS tablet Take 1 tablet  (25 mg total) by mouth daily before breakfast.   famotidine  (PEPCID ) 20 MG tablet Take 1 tablet (20 mg total) by mouth at bedtime.   nystatin -triamcinolone  ointment (MYCOLOG) Apply 1 application topically 2 (two) times daily.   Semaglutide , 2 MG/DOSE, (OZEMPIC , 2 MG/DOSE,) 8 MG/3ML SOPN INJECT 2MG  INTO THE SKIN ONCE WEEKLY   sildenafil  (VIAGRA ) 100 MG tablet Take 1 tablet (100 mg total) by mouth daily as needed for erectile dysfunction.   silodosin  (RAPAFLO ) 8 MG CAPS capsule Take 1 capsule (8 mg total) by mouth daily with breakfast.   triamcinolone  cream (KENALOG ) 0.1 % Apply 1 application topically 2 (two) times daily.   [DISCONTINUED] digoxin  (LANOXIN ) 0.125 MG tablet Take 1 tablet (0.125 mg total) by mouth daily.     Allergies:   Wasp venom, Wasp venom protein, Coffee flavoring agent (non-screening), and Onion   Social History   Socioeconomic History   Marital status: Single    Spouse name: Not on file   Number of children: Not on file   Years of education: Not on file   Highest education level: Not on file  Occupational History   Not on file  Tobacco Use   Smoking status: Former    Current packs/day: 0.00    Average packs/day: 2.0 packs/day for 48.0 years (96.0 ttl pk-yrs)    Types: Cigarettes    Start date: 02/12/1972    Quit date: 02/12/2020    Years since quitting: 3.4   Smokeless tobacco: Never  Vaping Use   Vaping status: Never Used  Substance and Sexual Activity   Alcohol use: No    Comment: Never been a problem   Drug use: No   Sexual activity: Yes  Other Topics Concern   Not on file  Social History Narrative   Single, lives alone, unemployed.   Social Drivers of Health   Financial Resource Strain: Medium Risk (06/08/2023)   Overall Financial Resource Strain (CARDIA)    Difficulty of Paying Living Expenses: Somewhat hard  Food Insecurity: Food Insecurity Present (06/08/2023)   Hunger Vital Sign    Worried About Running Out of Food in the Last Year: Never true     Ran Out of Food in the Last Year: Sometimes true  Transportation Needs: No Transportation Needs (06/08/2023)   PRAPARE - Administrator, Civil Service (Medical): No    Lack of Transportation (Non-Medical): No  Physical Activity: Insufficiently Active (06/08/2023)   Exercise Vital Sign    Days of Exercise per Week: 3 days    Minutes of Exercise per Session: 30 min  Stress: No Stress Concern Present (06/08/2023)   [employer] of Occupational Health - Occupational Stress Questionnaire    Feeling of Stress : Not at all  Social Connections: Unknown (06/08/2023)   Social Connection and Isolation Panel [NHANES]    Frequency of Communication with Friends and Family: Once a week    Frequency of Social Gatherings with Friends and Family: Once a week    Attends Religious  Services: Patient declined    Active Member of Clubs or Organizations: No    Attends Banker Meetings: Never    Marital Status: Never married     Family History: The patient's family history includes Cancer in his maternal aunt; Colon cancer in his maternal uncle; Diabetes in his brother, mother, sister, sister, sister, and sister; Diabetes Mellitus II in his brother and mother; Heart disease in his father; Hyperlipidemia in his brother; Tuberculosis in his father. There is no history of Prostate cancer.  ROS:   Please see the history of present illness.     All other systems reviewed and are negative.  EKGs/Labs/Other Studies Reviewed:    The following studies were reviewed today:   EKG Interpretation Date/Time:  Wednesday Jul 30 2023 11:07:24 EDT Ventricular Rate:  97 PR Interval:  140 QRS Duration:  86 QT Interval:  340 QTC Calculation: 431 R Axis:   57  Text Interpretation: Normal sinus rhythm Normal ECG Confirmed by Constancia Delton ([id number]) on 07/30/2023 11:37:29 AM    Recent Labs: 06/12/2023: Magnesium 2.2; TSH 0.63 06/14/2023: ALT 20 06/22/2023: BUN 19; Creatinine, Ser 0.75;  Hemoglobin 13.9; Platelets 453; Potassium 3.8; Sodium 139  Recent Lipid Panel    Component Value Date/Time   CHOL 133 06/12/2023 1110   CHOL 138 02/23/2021 1531   TRIG 65.0 06/12/2023 1110   HDL 43.30 06/12/2023 1110   HDL 41 02/23/2021 1531   CHOLHDL 3 06/12/2023 1110   VLDL 13.0 06/12/2023 1110   LDLCALC 77 06/12/2023 1110   LDLCALC 74 02/23/2021 1531   LDLCALC 40 01/21/2020 1505   LDLDIRECT 89.0 11/13/2021 1131    Physical Exam:    VS:  BP 130/80 (BP Location: Right Arm, Patient Position: Sitting, Cuff Size: Normal)   Pulse 97   Ht 5\' 4"  (1.626 m)   Wt 169 lb 8 oz (76.9 kg)   SpO2 96%   BMI 29.09 kg/m     Wt Readings from Last 3 Encounters:  07/30/23 169 lb 8 oz (76.9 kg)  07/24/23 173 lb 9.6 oz (78.7 kg)  07/18/23 174 lb (78.9 kg)     GEN:  Well nourished, well developed in no acute distress HEENT: Normal NECK: No JVD; No carotid bruits CARDIAC: Tachycardic, regular, no murmurs, rubs, gallops RESPIRATORY:  Clear to auscultation without rales, wheezing or rhonchi  ABDOMEN: Soft, non-tender, non-distended MUSCULOSKELETAL:  No edema; No deformity  SKIN: Warm and dry NEUROLOGIC:  Alert and oriented x 3 PSYCHIATRIC:  Normal affect   ASSESSMENT:    1. Chest pain, unspecified type   2. Inappropriate sinus tachycardia (HCC)   3. Mixed hyperlipidemia   4. Tachycardia    PLAN:    In order of problems listed above:  Chest pain, several risk factors, get Lexiscan  Myoview  to rule out ischemia. Inappropriate sinus tachycardia, improved with digoxin .  Occasional tachycardia.  Increase digoxin  to 0.25 mg daily.    Hyperlipidemia, cholesterol controlled, continue Lipitor 40 mg daily.  Follow-up 2-3 months.  Informed Consent   Shared Decision Making/Informed Consent The risks [chest pain, shortness of breath, cardiac arrhythmias, dizziness, blood pressure fluctuations, myocardial infarction, stroke/transient ischemic attack, nausea, vomiting, allergic reaction,  radiation exposure, metallic taste sensation and life-threatening complications (estimated to be 1 in 10,000)], benefits (risk stratification, diagnosing coronary artery disease, treatment guidance) and alternatives of a nuclear stress test were discussed in detail with Mr. Haberman and he agrees to proceed.      Medication Adjustments/Labs and Tests Ordered: Current medicines  are reviewed at length with the patient today.  Concerns regarding medicines are outlined above.  Orders Placed This Encounter  Procedures   NM Myocar Multi W/Spect W/Wall Motion / EF   EKG 12-Lead    Meds ordered this encounter  Medications   DISCONTD: digoxin  (LANOXIN ) 0.25 MG tablet    Sig: Take 1 tablet (0.25 mg total) by mouth daily.    Dispense:  30 tablet    Refill:  3    Dc digoxin  0.25 per cardiology   digoxin  (LANOXIN ) 0.25 MG tablet    Sig: Take 1 tablet (0.25 mg total) by mouth daily.    Dispense:  30 tablet    Refill:  3     Patient Instructions  Medication Instructions:  Your physician recommends the following medication changes.  INCREASE: Digoxin  to 0.25 mg daily   *If you need a refill on your cardiac medications before your next appointment, please call your pharmacy*  Lab Work: No labs ordered today  If you have labs (blood work) drawn today and your tests are completely normal, you will receive your results only by: MyChart Message (if you have MyChart) OR A paper copy in the mail If you have any lab test that is abnormal or we need to change your treatment, we will call you to review the results.  Testing/Procedures: Your provider has ordered a Lexiscan / Exercise Myoview  Stress test. This will take place at [facility]. Please report to the [facility] medical mall entrance. The volunteers at the first desk will direct you where to go.  [facility] MYOVIEW   Your provider has ordered a Stress Test with nuclear imaging. The purpose of this test is to evaluate the blood supply to your heart muscle. This  procedure is referred to as a "Non-Invasive Stress Test." This is because other than having an IV started in your vein, nothing is inserted or "invades" your body. Cardiac stress tests are done to find areas of poor blood flow to the heart by determining the extent of coronary artery disease (CAD). Some patients exercise on a treadmill, which naturally increases the blood flow to your heart, while others who are unable to walk on a treadmill due to physical limitations will have a pharmacologic/chemical stress agent called Lexiscan  . This medicine will mimic walking on a treadmill by temporarily increasing your coronary blood flow.   Please note: these test may take anywhere between 2-4 hours to complete  How to prepare for your Myoview  test:  Nothing to eat for 6 hours prior to the test No caffeine for 24 hours prior to test No smoking 24 hours prior to test. Your medication may be taken with water.  If your doctor stopped a medication because of this test, do not take that medication. Ladies, please do not wear dresses.  Skirts or pants are appropriate. Please wear a short sleeve shirt. No perfume, cologne or lotion. Wear comfortable walking shoes. No heels!   PLEASE NOTIFY THE OFFICE AT LEAST 24 HOURS IN ADVANCE IF YOU ARE UNABLE TO KEEP YOUR APPOINTMENT.  [phone] AND  PLEASE NOTIFY NUCLEAR MEDICINE AT [facility] AT LEAST 24 HOURS IN ADVANCE IF YOU ARE UNABLE TO KEEP YOUR APPOINTMENT. [phone]   Follow-Up: At [facility], you and your health needs are our priority.  As part of our continuing mission to provide you with exceptional heart care, our providers are all part of one team.  This team includes your primary Cardiologist (physician) and Advanced Practice Providers or APPs (Physician  Assistants and Nurse Practitioners) who all work together to provide you with the care you need, when you need it.  Your next appointment:   3 month(s)  Provider:   You may see Constancia Delton, MD or one of the following Advanced Practice Providers on your designated Care Team:   Laneta Pintos, NP Gildardo Labrador, PA-C Varney Gentleman, PA-C Cadence Dilworth, PA-C Ronald Cockayne, NP Morey Ar, NP    We recommend signing up for the patient portal called "MyChart".  Sign up information is provided on this After Visit Summary.  MyChart is used to connect with patients for Virtual Visits (Telemedicine).  Patients are able to view lab/test results, encounter notes, upcoming appointments, etc.  Non-urgent messages can be sent to your provider as well.   To learn more about what you can do with MyChart, go to [email or web address].      Signed, Constancia Delton, MD  07/30/2023 12:34 PM    [facility]

## 2023-07-30 NOTE — Telephone Encounter (Signed)
 NOTED

## 2023-07-31 ENCOUNTER — Ambulatory Visit (INDEPENDENT_AMBULATORY_CARE_PROVIDER_SITE_OTHER): Admitting: Family

## 2023-07-31 ENCOUNTER — Encounter: Payer: Self-pay | Admitting: Family

## 2023-07-31 VITALS — BP 124/76 | HR 109 | Temp 97.8°F | Ht 64.0 in | Wt 169.0 lb

## 2023-07-31 DIAGNOSIS — R251 Tremor, unspecified: Secondary | ICD-10-CM | POA: Diagnosis not present

## 2023-07-31 NOTE — Progress Notes (Signed)
 Assessment & Plan:  Tremor of both hands Assessment & Plan: Presentation consistent with essential tremor. discussed aggravating features including caffeine use.  Advised to decrease caffeine and monitor for features that exacerbate or relieve symptom.  Discussed id symptom were to worsen or begin to affect quality life to let me know ;we discussed medication management      Return precautions given.   Risks, benefits, and alternatives of the medications and treatment plan prescribed today were discussed, and patient expressed understanding.   Education regarding symptom management and diagnosis given to patient on AVS either electronically or printed.  No follow-ups on file.  Bascom Bossier, [name]  Subjective:    Patient ID: Benjamin Conley, male    DOB: December 28, 1965, 58 y.o.   MRN: [id number]  CC: Benjamin Conley is a 58 y.o. male who presents today for an acute visit.    HPI: Complains of BL hand tremor x 7 days, waxes and wanes however significantly improved today.  7 days ago, he describes grabbing a plate with food and 'hotdogs were shakimg so bad.' He describes another instance when typing a message on his phone in which tremor present.   Denies anxiety  Former smoker  He drinks 30 ounces per day of soda.   Sleeping okay  No alcohol , sudafed use.    No family h/o tremor or PD.   Gait is stable. Balance at baseline. He uses a cane for balance, which is unchanged.      TSH 0.63  Stress test scheduled 08/21/23  Allergies: Wasp venom, Wasp venom protein, Coffee flavoring agent (non-screening), and Onion Current Outpatient Medications on File Prior to Visit  Medication Sig Dispense Refill   aspirin  81 MG EC tablet Take 1 tablet (81 mg total) by mouth daily. 90 tablet 3   atorvastatin  (LIPITOR) 40 MG tablet Take 1 tablet (40 mg total) by mouth daily. 90 tablet 3   blood glucose meter kit and supplies Dispense based on patient and insurance preference. Use  up to four times daily as directed. (FOR ICD-10 E10.9, E11.9). 1 each 0   clopidogrel  (PLAVIX ) 75 MG tablet Take 1 tablet by mouth once daily 90 tablet 1   Continuous Glucose Sensor (FREESTYLE LIBRE 3 SENSOR) MISC USE ONE SENSOR EVERY 14 DAYS TO CHECK GLUCOSE CONTINOUSLY 2 each 2   digoxin  (LANOXIN ) 0.25 MG tablet Take 1 tablet (0.25 mg total) by mouth daily. 30 tablet 3   empagliflozin  (JARDIANCE ) 25 MG TABS tablet Take 1 tablet (25 mg total) by mouth daily before breakfast. 90 tablet 3   famotidine  (PEPCID ) 20 MG tablet Take 1 tablet (20 mg total) by mouth at bedtime. 90 tablet 1   nystatin -triamcinolone  ointment (MYCOLOG) Apply 1 application topically 2 (two) times daily. 60 g 0   Semaglutide , 2 MG/DOSE, (OZEMPIC , 2 MG/DOSE,) 8 MG/3ML SOPN INJECT 2MG  INTO THE SKIN ONCE WEEKLY 9 mL 0   sildenafil  (VIAGRA ) 100 MG tablet Take 1 tablet (100 mg total) by mouth daily as needed for erectile dysfunction. 30 tablet 0   silodosin  (RAPAFLO ) 8 MG CAPS capsule Take 1 capsule (8 mg total) by mouth daily with breakfast. 30 capsule 6   triamcinolone  cream (KENALOG ) 0.1 % Apply 1 application topically 2 (two) times daily. 30 g 0   No current facility-administered medications on file prior to visit.    Review of Systems  Constitutional:  Negative for chills and fever.  Respiratory:  Negative for cough.   Cardiovascular:  Negative for  chest pain and palpitations.  Gastrointestinal:  Negative for nausea and vomiting.  Neurological:  Positive for tremors.      Objective:    BP 124/76   Pulse (!) 109   Temp 97.8 F (36.6 C)   Ht 5\' 4"  (1.626 m)   Wt 169 lb (76.7 kg)   SpO2 97%   BMI 29.01 kg/m   BP Readings from Last 3 Encounters:  07/31/23 124/76  07/30/23 130/80  07/24/23 118/80   Wt Readings from Last 3 Encounters:  07/31/23 169 lb (76.7 kg)  07/30/23 169 lb 8 oz (76.9 kg)  07/24/23 173 lb 9.6 oz (78.7 kg)    Physical Exam Vitals reviewed.  Constitutional:      Appearance: He is  well-developed.  Cardiovascular:     Rate and Rhythm: Regular rhythm.     Heart sounds: Normal heart sounds.  Pulmonary:     Effort: Pulmonary effort is normal. No respiratory distress.     Breath sounds: Normal breath sounds. No wheezing, rhonchi or rales.  Skin:    General: Skin is warm and dry.  Neurological:     Mental Status: He is alert.     Comments: Fine tremor with hands extended, improves with finger to nose.  Tremor absent when resting hands on thighs. No shuffling gait.  No cogwheeling on exam.  Gait somewhat unsteady, wide-based.  Use of cane.  Psychiatric:        Speech: Speech normal.        Behavior: Behavior normal.

## 2023-08-01 ENCOUNTER — Other Ambulatory Visit: Payer: Self-pay

## 2023-08-01 ENCOUNTER — Ambulatory Visit: Payer: Self-pay | Admitting: Family

## 2023-08-01 DIAGNOSIS — N3943 Post-void dribbling: Secondary | ICD-10-CM

## 2023-08-02 DIAGNOSIS — R251 Tremor, unspecified: Secondary | ICD-10-CM | POA: Insufficient documentation

## 2023-08-02 NOTE — Assessment & Plan Note (Addendum)
 Presentation consistent with essential tremor. discussed aggravating features including caffeine use.  Advised to decrease caffeine and monitor for features that exacerbate or relieve symptom.  Discussed id symptom were to worsen or begin to affect quality life to let me know ;we discussed medication management

## 2023-08-05 ENCOUNTER — Other Ambulatory Visit: Payer: Self-pay

## 2023-08-05 DIAGNOSIS — N401 Enlarged prostate with lower urinary tract symptoms: Secondary | ICD-10-CM

## 2023-08-06 LAB — PSA, TOTAL AND FREE
PSA, Free Pct: 20.5 %
PSA, Free: 0.84 ng/mL
Prostate Specific Ag, Serum: 4.1 ng/mL — ABNORMAL HIGH (ref 0.0–4.0)

## 2023-08-07 ENCOUNTER — Emergency Department: Admission: EM | Admit: 2023-08-07 | Discharge: 2023-08-07 | Disposition: A | Discharge status: Home / Self Care

## 2023-08-07 ENCOUNTER — Other Ambulatory Visit: Payer: Self-pay

## 2023-08-07 ENCOUNTER — Emergency Department

## 2023-08-07 ENCOUNTER — Encounter: Payer: Self-pay | Admitting: Family

## 2023-08-07 ENCOUNTER — Encounter: Payer: Self-pay | Admitting: Cardiology

## 2023-08-07 DIAGNOSIS — R000 Tachycardia, unspecified: Secondary | ICD-10-CM | POA: Insufficient documentation

## 2023-08-07 DIAGNOSIS — I2510 Atherosclerotic heart disease of native coronary artery without angina pectoris: Secondary | ICD-10-CM | POA: Insufficient documentation

## 2023-08-07 DIAGNOSIS — R519 Headache, unspecified: Secondary | ICD-10-CM | POA: Insufficient documentation

## 2023-08-07 DIAGNOSIS — R079 Chest pain, unspecified: Secondary | ICD-10-CM | POA: Diagnosis not present

## 2023-08-07 DIAGNOSIS — Z87891 Personal history of nicotine dependence: Secondary | ICD-10-CM | POA: Insufficient documentation

## 2023-08-07 DIAGNOSIS — R0602 Shortness of breath: Secondary | ICD-10-CM | POA: Insufficient documentation

## 2023-08-07 DIAGNOSIS — E119 Type 2 diabetes mellitus without complications: Secondary | ICD-10-CM | POA: Diagnosis not present

## 2023-08-07 DIAGNOSIS — J449 Chronic obstructive pulmonary disease, unspecified: Secondary | ICD-10-CM | POA: Diagnosis not present

## 2023-08-07 DIAGNOSIS — R072 Precordial pain: Secondary | ICD-10-CM | POA: Insufficient documentation

## 2023-08-07 DIAGNOSIS — R0789 Other chest pain: Secondary | ICD-10-CM | POA: Diagnosis not present

## 2023-08-07 LAB — BASIC METABOLIC PANEL WITH GFR
Anion gap: 9 (ref 5–15)
BUN: 19 mg/dL (ref 6–20)
CO2: 22 mmol/L (ref 22–32)
Calcium: 9.3 mg/dL (ref 8.9–10.3)
Chloride: 105 mmol/L (ref 98–111)
Creatinine, Ser: 0.61 mg/dL (ref 0.61–1.24)
GFR, Estimated: >60 mL/min (ref >60)
Glucose, Bld: 121 mg/dL — ABNORMAL HIGH (ref 70–99)
Potassium: 3.9 mmol/L (ref 3.5–5.1)
Sodium: 136 mmol/L (ref 135–145)

## 2023-08-07 LAB — CBC
HCT: 46.4 % (ref 39.0–52.0)
Hemoglobin: 14.4 g/dL (ref 13.0–17.0)
MCH: 28.9 pg (ref 26.0–34.0)
MCHC: 31 g/dL (ref 30.0–36.0)
MCV: 93 fL (ref 80.0–100.0)
Platelets: 400 10*3/uL (ref 150–400)
RBC: 4.99 MIL/uL (ref 4.22–5.81)
RDW: 14.7 % (ref 11.5–15.5)
WBC: 7.4 10*3/uL (ref 4.0–10.5)
nRBC: 0 % (ref 0.0–0.2)

## 2023-08-07 LAB — TROPONIN I (HIGH SENSITIVITY)
Troponin I (High Sensitivity): 4 ng/L (ref <18)
Troponin I (High Sensitivity): 4 ng/L (ref <18)

## 2023-08-07 LAB — D-DIMER, QUANTITATIVE: D-Dimer, Quant: <0.27 ug{FEU}/mL (ref 0.00–0.50)

## 2023-08-07 MED ORDER — ACETAMINOPHEN 500 MG PO TABS
1000.0000 mg | ORAL_TABLET | Freq: Once | ORAL | Status: AC
  Administered 2023-08-07: 1000 mg via ORAL
  Filled 2023-08-07: qty 2

## 2023-08-07 MED ORDER — ALUM & MAG HYDROXIDE-SIMETH 200-200-20 MG/5ML PO SUSP
30.0000 mL | Freq: Once | ORAL | Status: AC
  Administered 2023-08-07: 30 mL via ORAL
  Filled 2023-08-07: qty 30

## 2023-08-07 NOTE — ED Provider Notes (Signed)
 [facility] Provider Note    Event Date/Time   First MD Initiated Contact with Patient 08/07/23 1653     (approximate)   History   Chest Pain  Pt to ED via POV from home. Pt reports intermittent centralized CP. Pt reports presents for 3-4 days. Pt reports hx of CAD and advised by PCP to come to ED. Pt also reports SOB and intermittent HA.    HPI Benjamin Conley is a 58 y.o. male  pmh diabetes, orthostatic hypotension, known sinus tachycardia, splenic infarct status post stent placement (2021), COPD, hyperlipidemia, former smoking history presents for evaluation of chest pain - Patient has been having chronic substernal chest discomfort, feels it is become more frequent over the past 3-4 days so called his primary care clinic and was told to come to emergency department. - Last seconds to minutes at a time, central substernal.  Nonradiating.  No nausea/vomiting/diarrhea.  No shortness of breath.  No cough.  No leg swelling.  No history of DVT/PE, no recent surgery/stasis/travel.   Per chart review, patient was seen in cardiology clinic for chest pain on 07/30/2023 --ordered myocardial perfusion scan to rule out ischemia to occur on outpatient basis --appears to be scheduled for 08/21/2023.  Increased digoxin  for inappropriate sinus tachycardia.   Last seen in emergency department for left shoulder pain last month, CT PE negative at that time.      Physical Exam   Triage Vital Signs: ED Triage Vitals  Encounter Vitals Group     BP 08/07/23 1531 (!) 166/91     Systolic BP Percentile --      Diastolic BP Percentile --      Pulse Rate 08/07/23 1530 (!) 120     Resp 08/07/23 1530 20     Temp 08/07/23 1531 97.6 F (36.4 C)     Temp Source 08/07/23 1531 Oral     SpO2 08/07/23 1530 99 %     Weight --      Height --      Head Circumference --      Peak Flow --      Pain Score 08/07/23 1531 5     Pain Loc --      Pain Education --      Exclude from  Growth Chart --     Most recent vital signs: Vitals:   08/07/23 2145 08/07/23 2200  BP:  123/89  Pulse: 100 98  Resp: 19 17  Temp:    SpO2: 98% 98%     General: Awake, no distress.  CV:  Good peripheral perfusion.  Mild tachycardia with heart rate in the low 100s, RP 2+ Resp:  Normal effort. CTAB Abd:  No distention. Nontender to deep palpation throughout Other: No leg swelling appreciated   ED Results / Procedures / Treatments   Labs (all labs ordered are listed, but only abnormal results are displayed) Labs Reviewed  BASIC METABOLIC PANEL WITH GFR - Abnormal; Notable for the following components:      Result Value   Glucose, Bld 121 (*)    All other components within normal limits  CBC  D-DIMER, QUANTITATIVE  TROPONIN I (HIGH SENSITIVITY)  TROPONIN I (HIGH SENSITIVITY)     EKG  See ED course below   RADIOLOGY Radiology interpreted by myself and radiology reports reviewed.  No clear acute pathology on chest x-ray.    PROCEDURES:  Critical Care performed: No  Procedures   MEDICATIONS ORDERED IN ED: Medications  acetaminophen  (TYLENOL ) tablet 1,000 mg (1,000 mg Oral Given 08/07/23 1821)  alum & mag hydroxide-simeth (MAALOX/MYLANTA) 200-200-20 MG/5ML suspension 30 mL (30 mLs Oral Given 08/07/23 1821)     IMPRESSION / MDM / ASSESSMENT AND PLAN / ED COURSE  I reviewed the triage vital signs and the nursing notes.                              DDX/MDM/AP: Differential diagnosis includes, but is not limited to, ACS, noncardiac chest pain including MSK strain, dyspepsia. Considered but doubt pulmonary embolism, pneumothorax, aortic dissection.  No tenderness to the palpation throughout abdomen, do not suspect acute intra-abdominal pathology.  Plan: - EKG - Chest x-ray - Labs - GI cocktail - Reassess  Patient's presentation is most consistent with acute presentation with potential threat to life or bodily function.  The patient is on the cardiac  monitor to evaluate for evidence of arrhythmia and/or significant heart rate changes.  ED course below.  Workup unremarkable including EKG, serial troponins, chest x-ray.  Patient feeling somewhat better after Tylenol  and GI cocktail.  Unclear etiology of chest discomfort at this time, doubt cardiac nature though given multiple comorbidities did offer admission for exercise stress testing.  Patient prefers to continue with his plan for outpatient stress test which I believe is reasonable.  Clinical Course as of 08/07/23 2331  Thu Aug 07, 2023  1654 Cbc, bmp wnl Trop wnl [MM]  1654 Ecg = sinus tach, rate 112, no ST elevation or depression, no significant repolarization abnormality, normal axis, normal intervals.  No clear evidence of ischemia nor arrhythmia on my interpretation. [MM]  2044 Dimer wnl Rpt trop stable [MM]  2126 Patient reevaluated, findings discussed.  Offered admission for expedited stress testing though patient prefers to follow-up with his outpatient stress test plan.  I do believe this is reasonable and recommended he call his cardiologist tomorrow for further evaluation and management.  Strict ED return precautions in place.  Patient agrees with plan. [MM]    Clinical Course User Index [MM] Collis Deaner, MD     FINAL CLINICAL IMPRESSION(S) / ED DIAGNOSES   Final diagnoses:  Chest pain, unspecified type     Rx / DC Orders   ED Discharge Orders     None        Note:  This document was prepared using Dragon voice recognition software and may include unintentional dictation errors.   Collis Deaner, MD 08/07/23 [phone]

## 2023-08-07 NOTE — Telephone Encounter (Signed)
 Agreed, he needs to be evaluated.  Patient is in the ED at present.

## 2023-08-07 NOTE — Discharge Instructions (Signed)
 Your evaluation in the emergency department was overall reassuring.  I am unsure as to the exact cause of your chest pain, but I agree that continuing with your plan for a stress test is appropriate.  Please do follow-up with your primary care doctor and cardiologist for reevaluation, and return to the emergency department with any new or worsening symptoms.

## 2023-08-07 NOTE — ED Triage Notes (Signed)
 Pt to ED via POV from home. Pt reports intermittent centralized CP. Pt reports presents for 3-4 days. Pt reports hx of CAD and advised by PCP to come to ED. Pt also reports SOB and intermittent HA.

## 2023-08-07 NOTE — Telephone Encounter (Signed)
 noted

## 2023-08-08 NOTE — Progress Notes (Signed)
 08/12/2023 9:31 AM   Benjamin Conley [birth year] [id number]  Referring provider: Calista Catching, [name] [address] [place],  [place] [address]  Urological history: 1. ED -sildenafil  100 mg, on-demand-dosing  2. BPH with LU TS -cysto (2021) prominent lateral lobe enlargement of prostate and moderate bladder neck elevation -prostate volume 47 grams -tamsulosin  0.4 mg daily - discontinued secondary to drop in BP  3. Ejaculatory disorder -retrograde ejaculation secondary to BPH and tamsulosin    4. Elevated PSA  -PSA (07/2023) - 4.1 -prostate biopsy (07/2021) for PSA 4.1 - negative   Chief Complaint  Patient presents with   Follow-up    HPI: Benjamin Conley is a 58 y.o. male who presents today for follow up.    Previous records reviewed.   He has a history of an elevated PSA.  His recent PSA was 4.1, PSA, free 0.84, PSA, free % 20.5 resulting in a 10% probability of having prostate cancer at this time.     I PSS 4/2  He has no urinary complaints.  Patient denies any modifying or aggravating factors.  Patient denies any recent UTI's, gross hematuria, dysuria or suprapubic/flank pain.  Patient denies any fevers, chills, nausea or vomiting.     IPSS     Row Name 08/12/23 0900         International Prostate Symptom Score   How often have you had the sensation of not emptying your bladder? Less than 1 in 5     How often have you had to urinate less than every two hours? Not at All     How often have you found you stopped and started again several times when you urinated? Less than 1 in 5 times     How often have you found it difficult to postpone urination? Not at All     How often have you had a weak urinary stream? Less than 1 in 5 times     How often have you had to strain to start urination? Not at All     How many times did you typically get up at night to urinate? 1 Time     Total IPSS Score 4       Quality of Life due to urinary symptoms    If you were to spend the rest of your life with your urinary condition just the way it is now how would you feel about that? Mostly Satisfied                  Score:  1-7 Mild 8-19 Moderate 20-35 Severe  PMH: Past Medical History:  Diagnosis Date   Acute posthemorrhagic anemia    Complication of anesthesia    c/o difficulty breathing after anesthesia   COPD (chronic obstructive pulmonary disease) (HCC)    Diabetes mellitus without complication (HCC)    Hyperlipemia    Melena 08/21/2019   Paroxysmal supraventricular tachycardia (HCC) 08/19/2019   Postural dizziness with presyncope 08/21/2019   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   Tobacco abuse    Upper GI bleed 08/21/2019    Surgical History: Past Surgical History:  Procedure Laterality Date   BACK SURGERY     lumbar   COLONOSCOPY WITH PROPOFOL  N/A 08/24/2019   Procedure: COLONOSCOPY WITH PROPOFOL ;  Surgeon: Marnee Sink, MD;  Location: [facility] ENDOSCOPY;  Service: Endoscopy;  Laterality: N/A;   ESOPHAGOGASTRODUODENOSCOPY (EGD) WITH PROPOFOL  N/A 08/24/2019   Procedure: ESOPHAGOGASTRODUODENOSCOPY (EGD) WITH PROPOFOL ;  Surgeon: Marnee Sink, MD;  Location:  [facility] ENDOSCOPY;  Service: Endoscopy;  Laterality: N/A;   STENT PLACEMENT VASCULAR ([facility] HX)  07/2019   stenosis of distal splenic artery and stent placed   VISCERAL ANGIOGRAPHY N/A 08/06/2019   Procedure: VISCERAL ANGIOGRAPHY;  Surgeon: Jackquelyn Mass, MD;  Location: [facility] INVASIVE CV LAB;  Service: Cardiovascular;  Laterality: N/A;    Home Medications:  Allergies as of 08/12/2023       Reactions   Wasp Venom Anaphylaxis   Wasp Venom Protein Anaphylaxis   Coffee Flavoring Agent (non-screening) Nausea And Vomiting   Patient states coffee gives him nausea and stomach cramps   Onion Nausea And Vomiting        Medication List        Accurate as of August 12, 2023  9:31 AM. If you have any questions, ask your nurse or doctor.          aspirin  EC 81 MG tablet Take 1  tablet (81 mg total) by mouth daily.   atorvastatin  40 MG tablet Commonly known as: LIPITOR Take 1 tablet (40 mg total) by mouth daily.   blood glucose meter kit and supplies Dispense based on patient and insurance preference. Use up to four times daily as directed. (FOR ICD-10 E10.9, E11.9).   clopidogrel  75 MG tablet Commonly known as: PLAVIX  Take 1 tablet by mouth once daily   cyanocobalamin  1000 MCG tablet Commonly known as: VITAMIN B12 Take 1,000 mcg by mouth daily.   digoxin  0.25 MG tablet Commonly known as: Lanoxin  Take 1 tablet (0.25 mg total) by mouth daily.   empagliflozin  25 MG Tabs tablet Commonly known as: Jardiance  Take 1 tablet (25 mg total) by mouth daily before breakfast.   famotidine  20 MG tablet Commonly known as: Pepcid  Take 1 tablet (20 mg total) by mouth at bedtime.   FreeStyle Libre 3 Sensor Misc USE ONE SENSOR EVERY 14 DAYS TO CHECK GLUCOSE CONTINOUSLY   nystatin -triamcinolone  ointment Commonly known as: MYCOLOG Apply 1 application topically 2 (two) times daily.   Ozempic  (2 MG/DOSE) 8 MG/3ML Sopn Generic drug: Semaglutide  (2 MG/DOSE) INJECT 2MG  INTO THE SKIN ONCE WEEKLY   sildenafil  100 MG tablet Commonly known as: VIAGRA  Take 1 tablet (100 mg total) by mouth daily as needed for erectile dysfunction.   silodosin  8 MG Caps capsule Commonly known as: RAPAFLO  Take 1 capsule (8 mg total) by mouth daily with breakfast.   triamcinolone  cream 0.1 % Commonly known as: KENALOG  Apply 1 application topically 2 (two) times daily.        Allergies:  Allergies  Allergen Reactions   Wasp Venom Anaphylaxis   Wasp Venom Protein Anaphylaxis   Coffee Flavoring Agent (Non-Screening) Nausea And Vomiting    Patient states coffee gives him nausea and stomach cramps   Onion Nausea And Vomiting    Family History: Family History  Problem Relation Age of Onset   Diabetes Mother    Diabetes Mellitus II Mother    Tuberculosis Father    Heart disease  Father    Diabetes Sister    Diabetes Sister    Diabetes Sister    Diabetes Sister    Diabetes Brother    Diabetes Mellitus II Brother    Hyperlipidemia Brother    Cancer Maternal Aunt    Colon cancer Maternal Uncle    Prostate cancer Neg Hx     Social History:  reports that he quit smoking about 3 years ago. His smoking use included cigarettes. He started smoking about 51 years ago. He  has a 96 pack-year smoking history. He has never used smokeless tobacco. He reports that he does not drink alcohol and does not use drugs.   Physical Exam: BP 108/75   Pulse 87   Ht 5\' 4"  (1.626 m)   Wt 169 lb (76.7 kg)   BMI 29.01 kg/m   Constitutional:  Well nourished. Alert and oriented, No acute distress. HEENT: [place] AT, moist mucus membranes.  Trachea midline Cardiovascular: No clubbing, cyanosis, or edema. Respiratory: Normal respiratory effort, no increased work of breathing. GU: No CVA tenderness.  No bladder fullness or masses.  Patient with uncircumcised phallus.  Foreskin easily retracted   Urethral meatus is patent.  No penile discharge. No penile lesions or rashes. Scrotum without lesions, cysts, rashes and/or edema.  Testicles are located scrotally bilaterally. No masses are appreciated in the testicles. Left and right epididymis are normal. Rectal: Patient with  normal sphincter tone. Anus and perineum without scarring or rashes. No rectal masses are appreciated. Prostate is approximately 50 + grams, 1 cm rubbery nodule still appreciated in the left apex, unchanged.  Seminal vesicles cannot be palpated. Neurologic: Grossly intact, no focal deficits, moving all 4 extremities. Psychiatric: Normal mood and affect.   Laboratory data: Component     Latest Ref Rng 08/05/2023  Prostate Specific Ag, Serum     0.0 - 4.0 ng/mL 4.1 (H)   PSA, Free     N/A ng/mL 0.84   PSA, Free Pct     % 20.5     Legend: (H) High  CBC    Component Value Date/Time   WBC 7.4 08/07/2023 1533   RBC 4.99  08/07/2023 1533   HGB 14.4 08/07/2023 1533   HGB 11.8 (L) 03/16/2020 1540   HCT 46.4 08/07/2023 1533   HCT 35.9 (L) 03/16/2020 1540   PLT 400 08/07/2023 1533   PLT 540 (H) 03/16/2020 1540   MCV 93.0 08/07/2023 1533   MCV 89 03/16/2020 1540   MCV 90 08/30/2013 2322   MCH 28.9 08/07/2023 1533   MCHC 31.0 08/07/2023 1533   RDW 14.7 08/07/2023 1533   RDW 13.4 03/16/2020 1540   RDW 13.5 08/30/2013 2322   LYMPHSABS 3.7 06/14/2023 2017   LYMPHSABS 3.6 (H) 03/16/2020 1540   MONOABS 0.7 06/14/2023 2017   EOSABS 0.1 06/14/2023 2017   EOSABS 0.1 03/16/2020 1540   BASOSABS 0.1 06/14/2023 2017   BASOSABS 0.1 03/16/2020 1540    BMET    Component Value Date/Time   NA 136 08/07/2023 1533   NA 137 02/23/2021 1531   NA 137 08/30/2013 2322   K 3.9 08/07/2023 1533   K 3.5 08/30/2013 2322   CL 105 08/07/2023 1533   CL 101 08/30/2013 2322   CO2 22 08/07/2023 1533   CO2 23 08/30/2013 2322   GLUCOSE 121 (H) 08/07/2023 1533   GLUCOSE 374 (H) 08/30/2013 2322   BUN 19 08/07/2023 1533   BUN 13 02/23/2021 1531   BUN 13 08/30/2013 2322   CREATININE 0.61 08/07/2023 1533   CREATININE 0.66 (L) 01/21/2020 1505   CALCIUM  9.3 08/07/2023 1533   CALCIUM  9.1 08/30/2013 2322   EGFR 105 02/23/2021 1531   GFRNONAA >60 08/07/2023 1533   GFRNONAA >60 08/30/2013 2322    Component     Latest Ref Rng 06/12/2023  TSH     0.35 - 5.50 uIU/mL 0.63    Lipid Panel     Component Value Date/Time   CHOL 133 06/12/2023 1110   CHOL 138 02/23/2021  1531   TRIG 65.0 06/12/2023 1110   HDL 43.30 06/12/2023 1110   HDL 41 02/23/2021 1531   CHOLHDL 3 06/12/2023 1110   VLDL 13.0 06/12/2023 1110   LDLCALC 77 06/12/2023 1110   LDLCALC 74 02/23/2021 1531   LDLCALC 40 01/21/2020 1505   LDLDIRECT 89.0 11/13/2021 1131   LABVLDL 23 02/23/2021 1531    Component     Latest Ref Rng 06/12/2023  Hemoglobin A1C     4.0 - 5.6 % 7.3 !     Legend: ! Abnormal I have reviewed the labs.  Pertinent Imaging:  08/12/23 09:04   Scan Result 60ml    Assessment & Plan:    1. BPH with LU TS - continue silodosin  8 mg daily   2. Prostate nodule/Elevated PSA - Free and total PSA indicate a 10% probability of having prostate cancer and with a history of a negative prostate biopsy, we will continue to monitor with biannual screening  3.  Erectile dysfunction -not sexually active   Matilde Son, PA-C  [facility] [address], [address] [place], [place] [address] [phone]

## 2023-08-10 ENCOUNTER — Encounter: Payer: Self-pay | Admitting: Family

## 2023-08-12 ENCOUNTER — Ambulatory Visit (INDEPENDENT_AMBULATORY_CARE_PROVIDER_SITE_OTHER): Payer: Self-pay | Admitting: Urology

## 2023-08-12 ENCOUNTER — Encounter: Payer: Self-pay | Admitting: Urology

## 2023-08-12 VITALS — BP 108/75 | HR 87 | Ht 64.0 in | Wt 169.0 lb

## 2023-08-12 DIAGNOSIS — N529 Male erectile dysfunction, unspecified: Secondary | ICD-10-CM | POA: Diagnosis not present

## 2023-08-12 DIAGNOSIS — N401 Enlarged prostate with lower urinary tract symptoms: Secondary | ICD-10-CM | POA: Diagnosis not present

## 2023-08-12 DIAGNOSIS — N3943 Post-void dribbling: Secondary | ICD-10-CM | POA: Diagnosis not present

## 2023-08-12 DIAGNOSIS — N402 Nodular prostate without lower urinary tract symptoms: Secondary | ICD-10-CM

## 2023-08-12 DIAGNOSIS — R972 Elevated prostate specific antigen [PSA]: Secondary | ICD-10-CM | POA: Diagnosis not present

## 2023-08-12 LAB — BLADDER SCAN AMB NON-IMAGING

## 2023-08-12 NOTE — Telephone Encounter (Signed)
 Spoke with pt re mychart message  He had BL 'painful' pulsatile sensation in ears x 4 days ago. Resolved within minutes and hasn't recurred  Chronic BL tinnitus which he reports worse lately Flickering of eye has resolved  Not associated CP, sob, HA, vision changes, vertigo.   He is not wearing hearing aids  He is compliant with digoxin  0.25mg    Stress test sch 08/21/23 Zio  monitor 10/10/22 with occ paroxysmal SVTs  CT perfusion 09/09/2021 reassuring  MRA head 06/25/2021 Moderate to severe narrowing of the right proximal supraclinoid ICA and mild narrowing of the distal right cavernous ICA.  Reviewed Dr Zomorodi note 09/13/21 re pulsatile tinnitus  Follow up prn    Plan:   Advised patient to call Dr Archie Koch office to make him aware of painful pulsatile tinnitus.  We agreed to wait on further imaging until he can contacted Dr Archie Koch Pt will let me know right away if any issues in reaching him Advised wearing hearing aids

## 2023-08-14 ENCOUNTER — Other Ambulatory Visit: Payer: Self-pay | Admitting: Urology

## 2023-08-14 DIAGNOSIS — N401 Enlarged prostate with lower urinary tract symptoms: Secondary | ICD-10-CM

## 2023-08-20 ENCOUNTER — Encounter: Payer: Self-pay | Admitting: Family

## 2023-08-20 ENCOUNTER — Other Ambulatory Visit: Payer: Self-pay | Admitting: Family

## 2023-08-20 DIAGNOSIS — H9313 Tinnitus, bilateral: Secondary | ICD-10-CM

## 2023-08-21 ENCOUNTER — Encounter
Admission: RE | Admit: 2023-08-21 | Discharge: 2023-08-21 | Disposition: A | Discharge status: Home / Self Care | Source: Ambulatory Visit | Attending: Cardiology | Admitting: Cardiology

## 2023-08-21 ENCOUNTER — Encounter: Payer: Self-pay | Admitting: Family

## 2023-08-21 DIAGNOSIS — Z8679 Personal history of other diseases of the circulatory system: Secondary | ICD-10-CM | POA: Diagnosis not present

## 2023-08-21 DIAGNOSIS — R079 Chest pain, unspecified: Secondary | ICD-10-CM

## 2023-08-21 DIAGNOSIS — E119 Type 2 diabetes mellitus without complications: Secondary | ICD-10-CM | POA: Diagnosis not present

## 2023-08-21 DIAGNOSIS — S91301A Unspecified open wound, right foot, initial encounter: Secondary | ICD-10-CM | POA: Diagnosis not present

## 2023-08-21 LAB — NM MYOCAR MULTI W/SPECT W/WALL MOTION / EF
Estimated workload: 1
Exercise duration (min): 0 min
Exercise duration (sec): 0 s
LV dias vol: 83 mL (ref 62–150)
LV sys vol: 26 mL (ref 4.2–5.8)
MPHR: 163 {beats}/min
Nuc Stress EF: 69 %
Peak HR: 111 {beats}/min
Percent HR: 68 %
Rest HR: 89 {beats}/min
Rest Nuclear Isotope Dose: 10.9 mCi
SDS: 1
SRS: 0
SSS: 1
ST Depression (mm): 1 mm
Stress Nuclear Isotope Dose: 32.6 mCi
TID: 0.82

## 2023-08-21 MED ORDER — TECHNETIUM TC 99M TETROFOSMIN IV KIT
32.6400 | PACK | Freq: Once | INTRAVENOUS | Status: AC | PRN
  Administered 2023-08-21: 32.64 via INTRAVENOUS

## 2023-08-21 MED ORDER — REGADENOSON 0.4 MG/5ML IV SOLN
0.4000 mg | Freq: Once | INTRAVENOUS | Status: AC
  Administered 2023-08-21: 0.4 mg via INTRAVENOUS

## 2023-08-21 MED ORDER — TECHNETIUM TC 99M TETROFOSMIN IV KIT
10.0000 | PACK | Freq: Once | INTRAVENOUS | Status: AC | PRN
  Administered 2023-08-21: 10.89 via INTRAVENOUS

## 2023-08-21 NOTE — Telephone Encounter (Signed)
 Spoke to pt he is going to [facility]

## 2023-08-22 ENCOUNTER — Ambulatory Visit: Payer: Self-pay | Admitting: Cardiology

## 2023-08-26 ENCOUNTER — Other Ambulatory Visit: Payer: Self-pay | Admitting: Family

## 2023-08-26 ENCOUNTER — Encounter: Payer: Self-pay | Admitting: Family

## 2023-08-26 DIAGNOSIS — E119 Type 2 diabetes mellitus without complications: Secondary | ICD-10-CM

## 2023-08-27 NOTE — Telephone Encounter (Signed)
 Scheduled f/up appt and possible Cipro refill for 09/04/23. Pt also stated that his other foot may be getting infected as well, I asked him to take pic of that one and if he needed to be seen before appt to send me a message or call us 

## 2023-08-28 ENCOUNTER — Other Ambulatory Visit: Payer: Self-pay | Admitting: Family

## 2023-08-28 DIAGNOSIS — L0390 Cellulitis, unspecified: Secondary | ICD-10-CM

## 2023-08-28 NOTE — Progress Notes (Signed)
 Ref

## 2023-08-28 NOTE — Telephone Encounter (Signed)
 Noted

## 2023-08-29 ENCOUNTER — Ambulatory Visit
Admission: RE | Admit: 2023-08-29 | Discharge: 2023-08-29 | Disposition: A | Discharge status: Home / Self Care | Source: Ambulatory Visit | Attending: Family | Admitting: Family

## 2023-08-29 ENCOUNTER — Encounter: Payer: Self-pay | Admitting: Family

## 2023-08-29 DIAGNOSIS — Z95828 Presence of other vascular implants and grafts: Secondary | ICD-10-CM | POA: Diagnosis not present

## 2023-08-29 DIAGNOSIS — H9313 Tinnitus, bilateral: Secondary | ICD-10-CM | POA: Insufficient documentation

## 2023-08-29 DIAGNOSIS — H9201 Otalgia, right ear: Secondary | ICD-10-CM | POA: Diagnosis not present

## 2023-08-29 DIAGNOSIS — I6789 Other cerebrovascular disease: Secondary | ICD-10-CM | POA: Insufficient documentation

## 2023-08-29 DIAGNOSIS — H93A9 Pulsatile tinnitus, unspecified ear: Secondary | ICD-10-CM | POA: Diagnosis not present

## 2023-08-29 MED ORDER — GADOBUTROL 1 MMOL/ML IV SOLN
7.0000 mL | Freq: Once | INTRAVENOUS | Status: AC | PRN
  Administered 2023-08-29: 7 mL via INTRAVENOUS

## 2023-08-29 NOTE — Telephone Encounter (Signed)
 Spoke to pt advised go to ED do not wait to be seen in office. Pt stated that he will go to ED

## 2023-08-30 ENCOUNTER — Emergency Department
Admission: EM | Admit: 2023-08-30 | Discharge: 2023-08-30 | Disposition: A | Discharge status: Home / Self Care | Attending: Emergency Medicine | Admitting: Emergency Medicine

## 2023-08-30 ENCOUNTER — Emergency Department

## 2023-08-30 ENCOUNTER — Other Ambulatory Visit: Payer: Self-pay

## 2023-08-30 DIAGNOSIS — I2510 Atherosclerotic heart disease of native coronary artery without angina pectoris: Secondary | ICD-10-CM | POA: Insufficient documentation

## 2023-08-30 DIAGNOSIS — R0789 Other chest pain: Secondary | ICD-10-CM | POA: Insufficient documentation

## 2023-08-30 DIAGNOSIS — J449 Chronic obstructive pulmonary disease, unspecified: Secondary | ICD-10-CM | POA: Insufficient documentation

## 2023-08-30 DIAGNOSIS — E119 Type 2 diabetes mellitus without complications: Secondary | ICD-10-CM | POA: Diagnosis not present

## 2023-08-30 DIAGNOSIS — R079 Chest pain, unspecified: Secondary | ICD-10-CM | POA: Diagnosis not present

## 2023-08-30 DIAGNOSIS — R519 Headache, unspecified: Secondary | ICD-10-CM | POA: Diagnosis not present

## 2023-08-30 DIAGNOSIS — R0602 Shortness of breath: Secondary | ICD-10-CM | POA: Diagnosis not present

## 2023-08-30 HISTORY — DX: Atherosclerotic heart disease of native coronary artery without angina pectoris: I25.10

## 2023-08-30 HISTORY — DX: Disorder of kidney and ureter, unspecified: N28.9

## 2023-08-30 HISTORY — DX: Occlusion and stenosis of unspecified carotid artery: I65.29

## 2023-08-30 LAB — BASIC METABOLIC PANEL WITH GFR
Anion gap: 9 (ref 5–15)
BUN: 15 mg/dL (ref 6–20)
CO2: 21 mmol/L — ABNORMAL LOW (ref 22–32)
Calcium: 8.9 mg/dL (ref 8.9–10.3)
Chloride: 109 mmol/L (ref 98–111)
Creatinine, Ser: 0.73 mg/dL (ref 0.61–1.24)
GFR, Estimated: >60 mL/min (ref >60)
Glucose, Bld: 132 mg/dL — ABNORMAL HIGH (ref 70–99)
Potassium: 3.7 mmol/L (ref 3.5–5.1)
Sodium: 139 mmol/L (ref 135–145)

## 2023-08-30 LAB — CBC
HCT: 43.1 % (ref 39.0–52.0)
Hemoglobin: 13.4 g/dL (ref 13.0–17.0)
MCH: 27.7 pg (ref 26.0–34.0)
MCHC: 31.1 g/dL (ref 30.0–36.0)
MCV: 89 fL (ref 80.0–100.0)
Platelets: 382 10*3/uL (ref 150–400)
RBC: 4.84 MIL/uL (ref 4.22–5.81)
RDW: 14.6 % (ref 11.5–15.5)
WBC: 6.2 10*3/uL (ref 4.0–10.5)
nRBC: 0 % (ref 0.0–0.2)

## 2023-08-30 LAB — TROPONIN I (HIGH SENSITIVITY)
Troponin I (High Sensitivity): 4 ng/L (ref <18)
Troponin I (High Sensitivity): 4 ng/L (ref <18)

## 2023-08-30 MED ORDER — ASPIRIN 81 MG PO CHEW
324.0000 mg | CHEWABLE_TABLET | Freq: Once | ORAL | Status: AC
  Administered 2023-08-30: 324 mg via ORAL
  Filled 2023-08-30: qty 4

## 2023-08-30 MED ORDER — ISOSORBIDE MONONITRATE ER 30 MG PO TB24: 30.0000 mg | ORAL_TABLET | Freq: Every day | ORAL | 0 refills | Status: DC

## 2023-08-30 NOTE — Discharge Instructions (Addendum)
 The testing for your chest pain today was normal with no signs of a heart attack or other problem with your heart.  You were prescribed a medication to take daily to reduce your chest pain, but you cannot take Viagra  while taking this medication as it will cause a significant drop in your blood pressure.  You should follow-up with your cardiologist and return to the ER for any worsening symptoms.

## 2023-08-30 NOTE — ED Provider Notes (Signed)
 [facility] Provider Note    Event Date/Time   First MD Initiated Contact with Patient 08/30/23 1139     (approximate)   History   Chief Complaint Chest Pain and Shortness of Breath   HPI  Benjamin Conley is a 58 y.o. male with past medical history of hyperlipidemia, diabetes, CAD, COPD, and SVT who presents to the ED complaining of chest pain and shortness of breath.  Patient reports that for the past couple of weeks, whenever he walks around outside he will get out of breath and began to have discomfort in his chest.  He describes the pain as like a fist in the center of his chest that seems to improve with rest and when he returns inside.  He states he does not have similar symptoms when he exerts himself but remains inside.  He denies any fevers or cough and has not had any pain or swelling in his legs.  He does state he has been following with Dr. Darliss of cardiology for this problem.     Physical Exam   Triage Vital Signs: ED Triage Vitals [08/30/23 1130]  Encounter Vitals Group     BP (!) 163/83     Girls Systolic BP Percentile      Girls Diastolic BP Percentile      Boys Systolic BP Percentile      Boys Diastolic BP Percentile      Pulse Rate (!) 111     Resp 20     Temp 97.6 F (36.4 C)     Temp Source Oral     SpO2 100 %     Weight 160 lb (72.6 kg)     Height 5' 4 (1.626 m)     Head Circumference      Peak Flow      Pain Score 3     Pain Loc      Pain Education      Exclude from Growth Chart     Most recent vital signs: Vitals:   08/30/23 1130  BP: (!) 163/83  Pulse: (!) 111  Resp: 20  Temp: 97.6 F (36.4 C)  SpO2: 100%    Constitutional: Alert and oriented. Eyes: Conjunctivae are normal. Head: Atraumatic. Nose: No congestion/rhinnorhea. Mouth/Throat: Mucous membranes are moist.  Cardiovascular: Normal rate, regular rhythm. Grossly normal heart sounds.  2+ radial pulses bilaterally. Respiratory: Normal  respiratory effort.  No retractions. Lungs CTAB. Gastrointestinal: Soft and nontender. No distention. Musculoskeletal: No lower extremity tenderness nor edema.  Neurologic:  Normal speech and language. No gross focal neurologic deficits are appreciated.    ED Results / Procedures / Treatments   Labs (all labs ordered are listed, but only abnormal results are displayed) Labs Reviewed  BASIC METABOLIC PANEL WITH GFR - Abnormal; Notable for the following components:      Result Value   CO2 21 (*)    Glucose, Bld 132 (*)    All other components within normal limits  CBC  TROPONIN I (HIGH SENSITIVITY)  TROPONIN I (HIGH SENSITIVITY)     EKG  ED ECG REPORT I, Carlin Palin, the attending physician, personally viewed and interpreted this ECG.   Date: 08/30/2023  EKG Time: 11:31  Rate: 103  Rhythm: sinus tachycardia  Axis: Normal  Intervals:none  ST&T Change: Inferolateral ST and T wave abnormality, new from previous  RADIOLOGY Chest x-ray reviewed and interpreted by me with no infiltrate, edema, or effusion.  PROCEDURES:  Critical Care performed:  No  Procedures   MEDICATIONS ORDERED IN ED: Medications  aspirin  chewable tablet 324 mg (324 mg Oral Given 08/30/23 1158)     IMPRESSION / MDM / ASSESSMENT AND PLAN / ED COURSE  I reviewed the triage vital signs and the nursing notes.                              58 y.o. male with past medical history of hyperlipidemia, diabetes, CAD, COPD, and SVT who presents to the ED complaining of intermittent chest discomfort and difficulty breathing when walking outside for the past couple of weeks.  Patient's presentation is most consistent with acute presentation with potential threat to life or bodily function.  Differential diagnosis includes, but is not limited to, ACS, PE, dissection, pneumonia, pneumothorax, musculoskeletal pain, GERD, anxiety.  Patient nontoxic-appearing and in no acute distress, vital signs are  remarkable for tachycardia but otherwise reassuring.  EKG appears to show new inferolateral T wave inversions compared to previous, however troponin within normal limits.  Chest x-ray is unremarkable, additional labs without significant anemia, leukocytosis, electrolyte abnormality, or AKI.  Notably, patient had low risk stress testing performed within the past 2 weeks.  I discussed his presentation with Dr. Perla of cardiology, who agrees with plan for repeat troponin but if this is within normal limits then he would be appropriate for discharge home with outpatient follow-up.  Dr. Gollan recommends starting patient on Imdur for control of chest discomfort.  Repeat troponin within normal limits, patient remains chest pain-free on reassessment.  He is appropriate for discharge home with outpatient cardiology follow-up, was prescribed Imdur and counseled to stop Viagra  while taking this medication.  He was counseled to return to the ED for new or worsening symptoms, patient agrees with plan.      FINAL CLINICAL IMPRESSION(S) / ED DIAGNOSES   Final diagnoses:  Atypical chest pain     Rx / DC Orders   ED Discharge Orders          Ordered    Ambulatory referral to Cardiology        08/30/23 1428    isosorbide mononitrate (IMDUR) 30 MG 24 hr tablet  Daily        08/30/23 1429             Note:  This document was prepared using Dragon voice recognition software and may include unintentional dictation errors.   Willo Dunnings, MD 08/30/23 1430

## 2023-08-30 NOTE — ED Triage Notes (Signed)
 Pt to ED for headaches, SOB and CP like a fist in it since 2 days. SOB is only when he goes outside. Also dizziness when he goes outside. States the chest pain resolves when he goes outside and lies down.  Respirations are unlabored, skin dry.

## 2023-08-31 ENCOUNTER — Encounter: Payer: Self-pay | Admitting: Cardiology

## 2023-09-01 ENCOUNTER — Ambulatory Visit (INDEPENDENT_AMBULATORY_CARE_PROVIDER_SITE_OTHER): Admitting: Family

## 2023-09-01 ENCOUNTER — Encounter: Payer: Self-pay | Admitting: Family

## 2023-09-01 VITALS — BP 130/70 | HR 95 | Temp 97.6°F | Ht 64.0 in | Wt 171.4 lb

## 2023-09-01 DIAGNOSIS — L97509 Non-pressure chronic ulcer of other part of unspecified foot with unspecified severity: Secondary | ICD-10-CM | POA: Diagnosis not present

## 2023-09-01 DIAGNOSIS — R519 Headache, unspecified: Secondary | ICD-10-CM | POA: Insufficient documentation

## 2023-09-01 DIAGNOSIS — R079 Chest pain, unspecified: Secondary | ICD-10-CM | POA: Diagnosis not present

## 2023-09-01 DIAGNOSIS — Z7985 Long-term (current) use of injectable non-insulin antidiabetic drugs: Secondary | ICD-10-CM | POA: Diagnosis not present

## 2023-09-01 DIAGNOSIS — E11621 Type 2 diabetes mellitus with foot ulcer: Secondary | ICD-10-CM | POA: Diagnosis not present

## 2023-09-01 NOTE — Assessment & Plan Note (Addendum)
 Reviewed ED visit. No recurrent of cp.  Discussed h/o orthostatic hypotension and midodrine  use.  Advised to hold imdur 30mg  for now. Consider restarting at 15mg  d/t headache.   Continue midodrine  10mg  every day which he has taken today.  Follow up cardiology scheduled for 10/30/23.

## 2023-09-01 NOTE — Assessment & Plan Note (Addendum)
 No HA today. Persistent HA. Pending read of MRI brain, MRA Head.  STOP BANG 3.  Discussed OSA and risk of arrhythmia, stroke, heart attack, headache and fatigue.  Pending referral to pulmonology for OSA evaluation.

## 2023-09-01 NOTE — Patient Instructions (Signed)
 Hold imdur for now due to side effect.  Continue midodrine   Referral to pulmonology for sleep apnea evaluation  Let us  know if you dont hear back within a week in regards to an appointment being scheduled.   So that you are aware, if you are [facility] MyChart user , please pay attention to your MyChart messages as you may receive a MyChart message with a phone number to call and schedule this test/appointment own your own from our referral coordinator. This is a new process so I do not want you to miss this message.  If you are not a MyChart user, you will receive a phone call.

## 2023-09-01 NOTE — Assessment & Plan Note (Addendum)
 Bilateral great toe medial ulcer. Suspect d/t to DM and/or pressure from wearing the same shoe ( open toe sandal).   Improved. 08/21/23  culture from urgent care (+) for Pseudomonas , Sensitive to Cipro.   Advised to keep wound covered, clean and dry; change dressing daily.  New dressing applied today in the office.  Complete ciprofloxacin. Pending wound care appointment; advised may cancel as long as resolved by time of appointment.

## 2023-09-01 NOTE — Progress Notes (Signed)
 Assessment & Plan:  Chest pain, unspecified type Assessment & Plan: Reviewed ED visit. No recurrent of cp.  Discussed h/o orthostatic hypotension and midodrine  use.  Advised to hold imdur 30mg  for now. Consider restarting at 15mg  d/t headache.   Continue midodrine  10mg  every day which he has taken today.  Follow up cardiology scheduled for 10/30/23.    Nonintractable headache, unspecified chronicity pattern, unspecified headache type Assessment & Plan: No HA today. Persistent HA. Pending read of MRI brain, MRA Head.  STOP BANG 3.  Discussed OSA and risk of arrhythmia, stroke, heart attack, headache and fatigue.  Pending referral to pulmonology for OSA evaluation.    Orders: -     Pulmonary Visit  Diabetic ulcer of toe associated with type 2 diabetes mellitus, unspecified laterality, unspecified ulcer stage (HCC) Assessment & Plan: Bilateral great toe medial ulcer. Suspect d/t to DM and/or pressure from wearing the same shoe ( open toe sandal).   Improved. 08/21/23  culture from urgent care (+) for Pseudomonas , Sensitive to Cipro.   Advised to keep wound covered, clean and dry; change dressing daily.  New dressing applied today in the office.  Complete ciprofloxacin. Pending wound care appointment; advised may cancel as long as resolved by time of appointment.       Return precautions given.   Risks, benefits, and alternatives of the medications and treatment plan prescribed today were discussed, and patient expressed understanding.   Education regarding symptom management and diagnosis given to patient on AVS either electronically or printed.  No follow-ups on file.  Rollene Northern, [name]  Subjective:    Patient ID: Benjamin Conley, male    DOB: 08-24-1965, 58 y.o.   MRN: [id number]  CC: Benjamin Conley is a 58 y.o. male who presents today for follow up.   HPI: He took one dose imdur, he felt a HA and since stopped medication.   He noted the CP, sob, HA,  dizziness was worse he was outside standing. Improved inside and laying down.   No CP, dizziness today.    He has not taken viagra    H/o OSA. He would wake up not being ' to breathe'.   He is compliant with ciprofloxacin and ulcer has improved. No pus nor fever. He has 1-2 days left of antibiotic.    Seen in the ED 08/30/23 for CP, SOB Attending collaborated with Dr Gollan in setting of new inferolateral T wave inversion.   BP 163/83 CXR no acute disease Troponin x 2 negative  Started on imdur 30mg  for chest discomfort   Pending MRI brain /MRA head to be read  Low risk stress test 08/21/23   Vas US  ABI normal perfusion to the bilateral lower extremities Cipro 500mg  twice daily 10 days ( 20 tablets) sent in 08/21/23 at urgent care for right foot ulcer.   A1c 7.3 06/12/23   Allergies: Wasp venom, Wasp venom protein, Coffee flavoring agent (non-screening), and Onion Current Outpatient Medications on File Prior to Visit  Medication Sig Dispense Refill   aspirin  81 MG EC tablet Take 1 tablet (81 mg total) by mouth daily. 90 tablet 3   atorvastatin  (LIPITOR) 40 MG tablet Take 1 tablet (40 mg total) by mouth daily. 90 tablet 3   blood glucose meter kit and supplies Dispense based on patient and insurance preference. Use up to four times daily as directed. (FOR ICD-10 E10.9, E11.9). 1 each 0   clopidogrel  (PLAVIX ) 75 MG tablet Take 1 tablet by mouth once  daily 90 tablet 1   Continuous Glucose Sensor (FREESTYLE LIBRE 3 SENSOR) MISC USE ONE SENSOR EVERY 14 DAYS TO CHECK GLUCOSE CONTINOUSLY 2 each 2   cyanocobalamin  (VITAMIN B12) 1000 MCG tablet Take 1,000 mcg by mouth daily.     digoxin  (LANOXIN ) 0.25 MG tablet Take 1 tablet (0.25 mg total) by mouth daily. 30 tablet 3   empagliflozin  (JARDIANCE ) 25 MG TABS tablet Take 1 tablet (25 mg total) by mouth daily before breakfast. 90 tablet 3   famotidine  (PEPCID ) 20 MG tablet Take 1 tablet (20 mg total) by mouth at bedtime. 90 tablet 1    isosorbide mononitrate (IMDUR) 30 MG 24 hr tablet Take 1 tablet (30 mg total) by mouth daily. 30 tablet 0   midodrine  (PROAMATINE ) 10 MG tablet Take 10 mg by mouth.     nystatin -triamcinolone  ointment (MYCOLOG) Apply 1 application topically 2 (two) times daily. 60 g 0   Semaglutide , 2 MG/DOSE, (OZEMPIC , 2 MG/DOSE,) 8 MG/3ML SOPN INJECT 2 MG SUBCUTANEOUSLY ONCE A WEEK 9 mL 0   sildenafil  (VIAGRA ) 100 MG tablet Take 1 tablet (100 mg total) by mouth daily as needed for erectile dysfunction. 30 tablet 0   silodosin  (RAPAFLO ) 8 MG CAPS capsule TAKE 1 CAPSULE BY MOUTH ONCE DAILY WITH BREAKFAST 30 capsule 0   triamcinolone  cream (KENALOG ) 0.1 % Apply 1 application topically 2 (two) times daily. 30 g 0   No current facility-administered medications on file prior to visit.    Review of Systems    Objective:    BP 130/70   Pulse 95   Temp 97.6 F (36.4 C) (Oral)   Ht 5' 4 (1.626 m)   Wt 171 lb 6.4 oz (77.7 kg)   SpO2 99%   BMI 29.42 kg/m  BP Readings from Last 3 Encounters:  09/01/23 130/70  08/30/23 (!) 163/83  08/12/23 108/75   Wt Readings from Last 3 Encounters:  09/01/23 171 lb 6.4 oz (77.7 kg)  08/30/23 160 lb (72.6 kg)  08/12/23 169 lb (76.7 kg)    Physical Exam Vitals reviewed.  Constitutional:      Appearance: He is well-developed.   Cardiovascular:     Rate and Rhythm: Regular rhythm.     Heart sounds: Normal heart sounds.  Pulmonary:     Effort: Pulmonary effort is normal. No respiratory distress.     Breath sounds: Normal breath sounds. No wheezing, rhonchi or rales.   Skin:    General: Skin is warm and dry.         Comments: Erythema over medial BL great toe. Skin intact. No purulent discharge, tenderness, red streaks.    Neurological:     Mental Status: He is alert.   Psychiatric:        Speech: Speech normal.        Behavior: Behavior normal.

## 2023-09-01 NOTE — Telephone Encounter (Signed)
 Scheduled pt f/up with Provider today 6/23

## 2023-09-02 ENCOUNTER — Ambulatory Visit: Payer: 59 | Admitting: Cardiology

## 2023-09-03 ENCOUNTER — Ambulatory Visit: Admitting: Internal Medicine

## 2023-09-03 ENCOUNTER — Encounter: Payer: Self-pay | Admitting: Internal Medicine

## 2023-09-03 VITALS — BP 100/70 | HR 111 | Temp 97.9°F | Ht 64.0 in | Wt 168.0 lb

## 2023-09-03 DIAGNOSIS — R079 Chest pain, unspecified: Secondary | ICD-10-CM | POA: Diagnosis not present

## 2023-09-03 DIAGNOSIS — Z87891 Personal history of nicotine dependence: Secondary | ICD-10-CM

## 2023-09-03 DIAGNOSIS — J449 Chronic obstructive pulmonary disease, unspecified: Secondary | ICD-10-CM

## 2023-09-03 MED ORDER — ALBUTEROL SULFATE HFA 108 (90 BASE) MCG/ACT IN AERS: 2.0000 | INHALATION_SPRAY | Freq: Four times a day (QID) | RESPIRATORY_TRACT | 2 refills | Status: AC | PRN

## 2023-09-03 NOTE — Patient Instructions (Addendum)
 Recommend albuterol  as needed We will check oxygen levels at nighttime Follow-up lung cancer screening program Follow up with Cardiology Avoid Allergens and Irritants Avoid secondhand smoke Avoid SICK contacts Recommend  Masking  when appropriate Recommend Keep up-to-date with vaccinations

## 2023-09-03 NOTE — Progress Notes (Signed)
 DATA 09/13/2021 PFTs:FEV1 of 2.71 L or 89% predicted, FVC of 3.62 L or 91% predicted, FEV1/FVC of 75%, no significant bronchodilator response. Lung volumes were normal. Diffusion capacity is mildly impaired. Testing is consistent with diagnosis of emphysema, very mild in nature. 05/02/2022 LDCT lung: Lung RADS 2, benign appearance or behavior.  Mild centrilobular emphysema.  Calcified 8.8 mm nodule in the posterior left lower lobe consistent with a granuloma.  Additional scattered tiny bilateral pulmonary nodules evident.  No suspicious pulmonary nodule or mass.     CC Follow-up assessment for COPD Follow-up assessment from ER visit for atypical chest pain    HPI 58 y.o. male with past medical history of hyperlipidemia, diabetes, CAD, COPD, and SVT who presents to the ED complaining of chest pain and shortness of breath.  Patient reports that for the past couple of weeks, whenever he walks around outside he will get out of breath and began to have discomfort in his chest.  He describes the pain as like a fist in the center of his chest that seems to improve with rest and when he returns inside.  He states he does not have similar symptoms when he exerts himself but remains inside.  He denies any fevers or cough and has not had any pain or swelling in his legs.  He does state he has been following with Dr. Darliss of cardiology for this problem.   COPD history/assessment of COPD former smoker (96-pack-year history) who presents for follow-up on the issue of pulmonary emphysema.  He does not take any maintenance inhalers at this time Will plan to start albuterol  inhaler as needed He requires a cane with ambulation due to dizziness and unsteadiness.  Ambulating pulse oximetry in the office today did not reveal hypoxia We will plan for overnight pulse oximetry  Assessment of lung cancer screening program - lung cancer screening program CT on 02 May 2022 this was a lung RADS 2, he  will have follow-up CT February 2025.   He does have mild emphysema on CT.    No exacerbation at this time No evidence of heart failure at this time No evidence or signs of infection at this time No respiratory distress No fevers, chills, nausea, vomiting, diarrhea No evidence of lower extremity edema No evidence hemoptysis    Past Medical History:  Diagnosis Date   Acute posthemorrhagic anemia    Carotid artery stenosis    bilateral   Complication of anesthesia    c/o difficulty breathing after anesthesia   COPD (chronic obstructive pulmonary disease) (HCC)    Coronary artery disease    Diabetes mellitus without complication (HCC)    Hyperlipemia    Melena 08/21/2019   Paroxysmal supraventricular tachycardia (HCC) 08/19/2019   Postural dizziness with presyncope 08/21/2019   Renal disorder    early stage CKD   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   Tobacco abuse    Upper GI bleed 08/21/2019    Past Surgical History:  Procedure Laterality Date   BACK SURGERY     lumbar   COLONOSCOPY WITH PROPOFOL  N/A 08/24/2019   Procedure: COLONOSCOPY WITH PROPOFOL ;  Surgeon: Jinny Carmine, MD;  Location: [facility] ENDOSCOPY;  Service: Endoscopy;  Laterality: N/A;   ESOPHAGOGASTRODUODENOSCOPY (EGD) WITH PROPOFOL  N/A 08/24/2019   Procedure: ESOPHAGOGASTRODUODENOSCOPY (EGD) WITH PROPOFOL ;  Surgeon: Jinny Carmine, MD;  Location: [facility] ENDOSCOPY;  Service: Endoscopy;  Laterality: N/A;   STENT PLACEMENT VASCULAR ([facility] HX)  07/2019   stenosis of distal splenic artery and stent placed  VISCERAL ANGIOGRAPHY N/A 08/06/2019   Procedure: VISCERAL ANGIOGRAPHY;  Surgeon: Jama Cordella MATSU, MD;  Location: [facility] INVASIVE CV LAB;  Service: Cardiovascular;  Laterality: N/A;    Patient Active Problem List   Diagnosis Date Noted   Headache 09/01/2023   Toe ulcer due to DM (HCC) 09/01/2023   Tremor of both hands 08/02/2023   Cerebral ischemia 07/24/2023   Left shoulder pain 07/07/2023   Dysphagia 06/12/2023    History of medication noncompliance 06/12/2023   Open toe wound 10/30/2022   PAD (peripheral artery disease) (HCC) 07/19/2022   Anemia 05/15/2022   Photophobia of both eyes 02/20/2022   Bilateral hand pain 08/13/2021   Dizziness 06/11/2021   Sinus congestion 06/11/2021   Pulmonary emphysema (HCC) 05/07/2021   GERD (gastroesophageal reflux disease) 01/12/2021   Screening for blood or protein in urine 06/16/2020   Contact dermatitis 06/16/2020   Leukocytosis 01/30/2020   Nicotine  use disorder 01/27/2020   Slow transit constipation 01/27/2020   Nail avulsion of toe, initial encounter 12/09/2019   Benign prostatic hyperplasia with hesitancy 10/30/2019   Abnormal ejaculation 10/21/2019   B12 deficiency 10/21/2019   Rectal pressure 10/21/2019   Thrombosis of splenic artery (HCC) 10/03/2019   Abnormal brain MRI 09/22/2019   Difficulty sleeping 09/22/2019   Tinnitus of both ears 09/22/2019   Pain due to onychomycosis of toenails of both feet 09/06/2019   Coagulation disorder (HCC) 09/06/2019   Iron  deficiency anemia due to chronic blood loss 08/29/2019   Thrombocytosis 08/24/2019   Angiodysplasia of intestinal tract    Black stool 08/22/2019   Presence of arterial stent - splenic artery 08/21/2019   Orthostatic hypotension 08/19/2019   Tachycardia 08/19/2019   Chest pain 08/19/2019   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   COPD with chronic bronchitis (HCC) 08/01/2019   Tobacco abuse 08/01/2019   Diabetes mellitus without complication (HCC)    Hyperlipidemia    Impingement syndrome of shoulder region 03/30/2018   Hypertension 11/10/2013    Family History  Problem Relation Age of Onset   Diabetes Mother    Diabetes Mellitus II Mother    Tuberculosis Father    Heart disease Father    Diabetes Sister    Diabetes Sister    Diabetes Sister    Diabetes Sister    Diabetes Brother    Diabetes Mellitus II Brother    Hyperlipidemia Brother    Cancer Maternal Aunt    Colon cancer Maternal  Uncle    Prostate cancer Neg Hx     Social History   Tobacco Use   Smoking status: Former    Current packs/day: 0.00    Average packs/day: 2.0 packs/day for 48.0 years (96.0 ttl pk-yrs)    Types: Cigarettes    Start date: 02/12/1972    Quit date: 02/12/2020    Years since quitting: 3.5   Smokeless tobacco: Never  Substance Use Topics   Alcohol use: No    Comment: Never been a problem    Allergies  Allergen Reactions   Wasp Venom Anaphylaxis   Wasp Venom Protein Anaphylaxis   Coffee Flavoring Agent (Non-Screening) Nausea And Vomiting    Patient states coffee gives him nausea and stomach cramps   Onion Nausea And Vomiting    No outpatient medications have been marked as taking for the 09/03/23 encounter (Office Visit) with Tammie Yanda, MD.    Immunization History  Administered Date(s) Administered   Td 03/11/2004   Tdap 08/02/2014      Objective:    BP 100/70 (  BP Location: Left Arm, Patient Position: Sitting, Cuff Size: Normal)   Pulse (!) 111   Temp 97.9 F (36.6 C) (Oral)   Ht 5' 4 (1.626 m)   Wt 168 lb (76.2 kg)   SpO2 97%   BMI 28.84 kg/m      Review of Systems: Gen:  Denies  fever, sweats, chills weight loss  HEENT: Denies blurred vision, double vision, ear pain, eye pain, hearing loss, nose bleeds, sore throat Cardiac:  No dizziness, chest pain or heaviness, chest tightness,edema, No JVD Resp:   No cough, -sputum production, -shortness of breath,-wheezing, -hemoptysis,  Other:  All other systems negative   Physical Examination:   General Appearance: No distress  EYES PERRLA, EOM intact.   NECK Supple, No JVD Pulmonary: normal breath sounds, No wheezing.  CardiovascularNormal S1,S2.  No m/r/g.   Abdomen: Benign, Soft, non-tender. Neurology UE/LE 5/5 strength, no focal deficits Ext pulses intact, cap refill intact ALL OTHER ROS ARE NEGATIVE       Assessment and Plan 58 year old pleasant white male seen today for assessment of COPD also  follow-up for atypical chest pain seen in the ER recently with extensive smoking history enrolled in the lung cancer screening program  COPD Gold stage a mild Recommend albuterol  as needed No maintenance therapy needed at this time Avoid Allergens and Irritants Avoid secondhand smoke Avoid SICK contacts Recommend  Masking  when appropriate Recommend Keep up-to-date with vaccinations Check overnight pulse oximetry  Atypical chest pain Follow-up with cardiology  Heavy smoking history Follow-up lung cancer screening program February 2025 low-dose CT reviewed in detail Mild centrilobular emphysema. Calcified granulomas. Pulmonary nodules measure 5.0 mm or less in size, as before. No new pulmonary nodules. No pleural fluid. Airway is unremarkable.  MEDICATION ADJUSTMENTS/LABS AND TESTS ORDERED: Albuterol  as needed Overnight pulse oximetry Follow-up lung cancer screening program Avoid Allergens and Irritants Avoid secondhand smoke Avoid SICK contacts Recommend  Masking  when appropriate Recommend Keep up-to-date with vaccinations Follow-up with cardiology   CURRENT MEDICATIONS REVIEWED AT LENGTH WITH PATIENT TODAY   Patient  satisfied with Plan of action and management. All questions answered   Follow up 6 months   I spent a total of 42 minutes reviewing chart data, face-to-face evaluation with the patient, counseling and coordination of care as detailed above.      Nickolas Alm Cellar, M.D.  [facility]  Medical Director [facility] Medical Director [facility] Cardio-Pulmonary Department

## 2023-09-04 ENCOUNTER — Encounter: Payer: Self-pay | Admitting: Family

## 2023-09-04 ENCOUNTER — Telehealth: Admitting: Physician Assistant

## 2023-09-04 ENCOUNTER — Other Ambulatory Visit: Payer: Self-pay | Admitting: Family

## 2023-09-04 ENCOUNTER — Ambulatory Visit: Admitting: Family

## 2023-09-04 DIAGNOSIS — T148XXA Other injury of unspecified body region, initial encounter: Secondary | ICD-10-CM

## 2023-09-04 DIAGNOSIS — E11621 Type 2 diabetes mellitus with foot ulcer: Secondary | ICD-10-CM

## 2023-09-04 MED ORDER — CIPROFLOXACIN HCL 500 MG PO TABS: 500.0000 mg | ORAL_TABLET | Freq: Two times a day (BID) | ORAL | 0 refills | Status: AC

## 2023-09-04 NOTE — Progress Notes (Signed)
  Because of uncontrollable bleeding from an active wound, I feel your condition warrants further evaluation and I recommend that you be seen in a face-to-face visit ASAP at nearest Urgent Care or ER facility.   NOTE: There will be NO CHARGE for this E-Visit   If you are having a true medical emergency, please call 911.     For an urgent face to face visit, [place] has multiple urgent care centers for your convenience.  Click the link below for the full list of locations and hours, walk-in wait times, appointment scheduling options and driving directions:  Urgent Care - [place], [place], [place], [place], [place], [place]  [place]     Your MyChart E-visit questionnaire answers were reviewed by a board certified advanced clinical practitioner to complete your personal care plan based on your specific symptoms.    Thank you for using e-Visits.

## 2023-09-08 ENCOUNTER — Encounter: Payer: Self-pay | Admitting: Internal Medicine

## 2023-09-10 ENCOUNTER — Encounter: Payer: Self-pay | Admitting: Family

## 2023-09-10 NOTE — Telephone Encounter (Signed)
 Spoke to pt cancelled appt for 15th for him

## 2023-09-16 ENCOUNTER — Ambulatory Visit: Payer: Self-pay | Admitting: Family

## 2023-09-17 ENCOUNTER — Encounter: Payer: Self-pay | Admitting: Family

## 2023-09-18 ENCOUNTER — Other Ambulatory Visit: Payer: Self-pay

## 2023-09-18 ENCOUNTER — Emergency Department

## 2023-09-18 ENCOUNTER — Emergency Department
Admission: EM | Admit: 2023-09-18 | Discharge: 2023-09-18 | Disposition: A | Discharge status: Home / Self Care | Attending: Emergency Medicine | Admitting: Emergency Medicine

## 2023-09-18 ENCOUNTER — Encounter: Payer: Self-pay | Admitting: Emergency Medicine

## 2023-09-18 DIAGNOSIS — E119 Type 2 diabetes mellitus without complications: Secondary | ICD-10-CM | POA: Diagnosis not present

## 2023-09-18 DIAGNOSIS — Z87891 Personal history of nicotine dependence: Secondary | ICD-10-CM | POA: Insufficient documentation

## 2023-09-18 DIAGNOSIS — E876 Hypokalemia: Secondary | ICD-10-CM | POA: Diagnosis not present

## 2023-09-18 DIAGNOSIS — J441 Chronic obstructive pulmonary disease with (acute) exacerbation: Secondary | ICD-10-CM | POA: Diagnosis not present

## 2023-09-18 DIAGNOSIS — R079 Chest pain, unspecified: Secondary | ICD-10-CM | POA: Diagnosis not present

## 2023-09-18 DIAGNOSIS — R0789 Other chest pain: Secondary | ICD-10-CM

## 2023-09-18 LAB — CBC
HCT: 42.5 % (ref 39.0–52.0)
Hemoglobin: 13.9 g/dL (ref 13.0–17.0)
MCH: 29.3 pg (ref 26.0–34.0)
MCHC: 32.7 g/dL (ref 30.0–36.0)
MCV: 89.7 fL (ref 80.0–100.0)
Platelets: 397 K/uL (ref 150–400)
RBC: 4.74 MIL/uL (ref 4.22–5.81)
RDW: 14.2 % (ref 11.5–15.5)
WBC: 7.2 K/uL (ref 4.0–10.5)
nRBC: 0 % (ref 0.0–0.2)

## 2023-09-18 LAB — BASIC METABOLIC PANEL WITH GFR
Anion gap: 11 (ref 5–15)
BUN: 16 mg/dL (ref 6–20)
CO2: 23 mmol/L (ref 22–32)
Calcium: 9.1 mg/dL (ref 8.9–10.3)
Chloride: 104 mmol/L (ref 98–111)
Creatinine, Ser: 0.82 mg/dL (ref 0.61–1.24)
GFR, Estimated: >60 mL/min (ref >60)
Glucose, Bld: 176 mg/dL — ABNORMAL HIGH (ref 70–99)
Potassium: 3.4 mmol/L — ABNORMAL LOW (ref 3.5–5.1)
Sodium: 138 mmol/L (ref 135–145)

## 2023-09-18 LAB — MAGNESIUM: Magnesium: 2.1 mg/dL (ref 1.7–2.4)

## 2023-09-18 LAB — TROPONIN I (HIGH SENSITIVITY)
Troponin I (High Sensitivity): 5 ng/L (ref <18)
Troponin I (High Sensitivity): 5 ng/L (ref <18)

## 2023-09-18 MED ORDER — IPRATROPIUM-ALBUTEROL 0.5-2.5 (3) MG/3ML IN SOLN
6.0000 mL | Freq: Once | RESPIRATORY_TRACT | Status: AC
  Administered 2023-09-18: 6 mL via RESPIRATORY_TRACT
  Filled 2023-09-18: qty 3

## 2023-09-18 MED ORDER — ASPIRIN 81 MG PO CHEW
324.0000 mg | CHEWABLE_TABLET | Freq: Once | ORAL | Status: AC
  Administered 2023-09-18: 324 mg via ORAL
  Filled 2023-09-18: qty 4

## 2023-09-18 MED ORDER — PREDNISONE 50 MG PO TABS: 50.0000 mg | ORAL_TABLET | Freq: Every day | ORAL | 0 refills | Status: DC

## 2023-09-18 MED ORDER — NITROGLYCERIN 0.4 MG SL SUBL
0.4000 mg | SUBLINGUAL_TABLET | SUBLINGUAL | Status: DC | PRN
  Administered 2023-09-18: 0.4 mg via SUBLINGUAL
  Filled 2023-09-18: qty 1

## 2023-09-18 MED ORDER — IPRATROPIUM-ALBUTEROL 0.5-2.5 (3) MG/3ML IN SOLN
6.0000 mL | Freq: Once | RESPIRATORY_TRACT | Status: AC
  Administered 2023-09-18: 6 mL via RESPIRATORY_TRACT
  Filled 2023-09-18: qty 6

## 2023-09-18 MED ORDER — POTASSIUM CHLORIDE CRYS ER 20 MEQ PO TBCR
40.0000 meq | EXTENDED_RELEASE_TABLET | Freq: Once | ORAL | Status: AC
  Administered 2023-09-18: 40 meq via ORAL
  Filled 2023-09-18: qty 2

## 2023-09-18 MED ORDER — METHYLPREDNISOLONE SODIUM SUCC 125 MG IJ SOLR
125.0000 mg | Freq: Once | INTRAMUSCULAR | Status: AC
  Administered 2023-09-18: 125 mg via INTRAVENOUS
  Filled 2023-09-18: qty 2

## 2023-09-18 NOTE — ED Provider Notes (Signed)
 [facility] Provider Note    Event Date/Time   First MD Initiated Contact with Patient 09/18/23 1545     (approximate)   History   Chest Pain   HPI  Benjamin Conley is a 58 y.o. male who presents to the ED for evaluation of Chest Pain   I review a cardiology clinic visit from 5/21 as well as an ED visit from 6/21 where he was seen for atypical chest pain.  He has a history of DM, orthostatic hypotension, chronic inappropriate sinus tachycardia, former smoker and previous splenic infarct with stent.  Low risk stress test 2021, repeat low risk/normal stress test last month..  On digoxin  for his inappropriate sinus tachycardia.  Patient presents for evaluation of nearly 1 week of chest pain.  He reports a constant component of a band around his chest and intermittent sharp pains to his thoracic back and anterior chest.  Dyspnea on exertion but no dyspnea at rest or orthopnea.  No increased cough, fevers or syncopal episodes.  Has had some dizziness though.   Physical Exam   Triage Vital Signs: ED Triage Vitals  Encounter Vitals Group     BP 09/18/23 1334 (!) 136/95     Girls Systolic BP Percentile --      Girls Diastolic BP Percentile --      Boys Systolic BP Percentile --      Boys Diastolic BP Percentile --      Pulse Rate 09/18/23 1334 (!) 104     Resp 09/18/23 1334 20     Temp 09/18/23 1334 98.4 F (36.9 C)     Temp Source 09/18/23 1334 Oral     SpO2 09/18/23 1334 100 %     Weight 09/18/23 1335 168 lb (76.2 kg)     Height 09/18/23 1335 5' 4 (1.626 m)     Head Circumference --      Peak Flow --      Pain Score 09/18/23 1335 5     Pain Loc --      Pain Education --      Exclude from Growth Chart --     Most recent vital signs: Vitals:   09/18/23 1334 09/18/23 1700  BP: (!) 136/95 128/79  Pulse: (!) 104 87  Resp: 20 15  Temp: 98.4 F (36.9 C)   SpO2: 100% 100%    General: Awake, no distress.  CV:  Good peripheral perfusion.   Resp:  Normal effort.  No tachypnea but slow expiratory phase and coarse breath sounds throughout, some faint and scattered expiratory wheezes. On my initial examination, somewhat difficult to appreciate heart sounds due to how loud his breath sounds are. Abd:  No distention.  MSK:  No deformity noted.  Neuro:  No focal deficits appreciated. Other:     ED Results / Procedures / Treatments   Labs (all labs ordered are listed, but only abnormal results are displayed) Labs Reviewed  BASIC METABOLIC PANEL WITH GFR - Abnormal; Notable for the following components:      Result Value   Potassium 3.4 (*)    Glucose, Bld 176 (*)    All other components within normal limits  CBC  MAGNESIUM  TROPONIN I (HIGH SENSITIVITY)  TROPONIN I (HIGH SENSITIVITY)    EKG Sinus rhythm with a rate of 1 1 bpm.  Normal axis and intervals.  Nonspecific T wave changes, T wave inversions to inferior and lateral leads.  Similar morphology as comparison from 3  weeks ago  RADIOLOGY CXR interpreted by me without evidence of acute cardiopulmonary pathology.  Official radiology report(s): DG Chest 2 View Result Date: 09/18/2023 CLINICAL DATA:  Chest pain EXAM: CHEST - 2 VIEW COMPARISON:  August 30, 2023 chest radiograph FINDINGS: The heart size and mediastinal contours are within normal limits. Both lungs are clear. The visualized skeletal structures are unremarkable. IMPRESSION: No active cardiopulmonary disease. Electronically Signed   By: Michaeline Blanch M.D.   On: 09/18/2023 14:05    PROCEDURES and INTERVENTIONS:  Procedures  Medications  nitroGLYCERIN  (NITROSTAT ) SL tablet 0.4 mg (0.4 mg Sublingual Given 09/18/23 1722)  potassium chloride  SA (KLOR-CON  M) CR tablet 40 mEq (40 mEq Oral Given 09/18/23 1613)  aspirin  chewable tablet 324 mg (324 mg Oral Given 09/18/23 1642)  ipratropium-albuterol  (DUONEB) 0.5-2.5 (3) MG/3ML nebulizer solution 6 mL (6 mLs Nebulization Given 09/18/23 1643)  methylPREDNISolone  sodium  succinate (SOLU-MEDROL ) 125 mg/2 mL injection 125 mg (125 mg Intravenous Given 09/18/23 1642)  ipratropium-albuterol  (DUONEB) 0.5-2.5 (3) MG/3ML nebulizer solution 6 mL (6 mLs Nebulization Given 09/18/23 1814)     IMPRESSION / MDM / ASSESSMENT AND PLAN / ED COURSE  I reviewed the triage vital signs and the nursing notes.  Differential diagnosis includes, but is not limited to, ACS, PTX, PNA, muscle strain/spasm, PE, dissection, anxiety, pleural effusion  {Patient presents with symptoms of an acute illness or injury that is potentially life-threatening.  Patient presents with atypical chest discomfort, likely of pulmonary etiology, ultimately suitable for outpatient management.  Initially tachycardic, resolving with nebulizers.  Wheezing on exam.  Clear CXR without increased sputum production or indication for antibiotics.  Normal CBC.  Mild hypokalemia is replaced orally, normal magnesium, 2 negative troponins.  I considered admission for this patient but considering his reassuring workup, recent negative stress test and higher likelihood of COPD causing his symptoms, we will discharge with steroids  Clinical Course as of 09/18/23 1937  Thu Sep 18, 2023  1807 Reassessed.  Lung sounds are improved with auscultation.  Reports initially his pain resolved after the breathing treatment but it has recurred.  His second troponin is negative.  Will provide an additional DuoNeb and reassess [DS]  1936 Reassessed.  After the second nebulizer he reports feeling much better, pain is resolved, breath sounds have cleared on auscultation.  We discussed plan of care he is comfortable going home.  He does not want to stay for admission, and I think this would be reasonable to try outpatient management.  We discussed steroids, expectant management, albuterol  as needed and ED return precautions. [DS]    Clinical Course User Index [DS] Claudene Rover, MD     FINAL CLINICAL IMPRESSION(S) / ED DIAGNOSES   Final  diagnoses:  Other chest pain  COPD exacerbation (HCC)     Rx / DC Orders   ED Discharge Orders          Ordered    predniSONE  (DELTASONE ) 50 MG tablet  Daily        09/18/23 1937             Note:  This document was prepared using Dragon voice recognition software and may include unintentional dictation errors.   Claudene Rover, MD 09/18/23 [phone]

## 2023-09-18 NOTE — ED Triage Notes (Signed)
 Pt to ER from home with c/o chest pain that started approximately 1 week ago.  Pt describes pain as a tightening to goes in a band around to his back.  Pt denies SHOB, except when I go out in the heat.  PT denies n/v or other associated symptoms.

## 2023-09-18 NOTE — Discharge Instructions (Signed)
 Prednisone  steroids once daily for 4 more days  Continue to use albuterol  as needed  Return to the ED with any worsening symptoms

## 2023-09-19 ENCOUNTER — Other Ambulatory Visit: Payer: Self-pay

## 2023-09-19 ENCOUNTER — Observation Stay
Admission: EM | Admit: 2023-09-19 | Discharge: 2023-09-19 | Disposition: A | Discharge status: Home / Self Care | Attending: Emergency Medicine | Admitting: Emergency Medicine

## 2023-09-19 DIAGNOSIS — N189 Chronic kidney disease, unspecified: Secondary | ICD-10-CM | POA: Insufficient documentation

## 2023-09-19 DIAGNOSIS — E1122 Type 2 diabetes mellitus with diabetic chronic kidney disease: Secondary | ICD-10-CM | POA: Insufficient documentation

## 2023-09-19 DIAGNOSIS — N179 Acute kidney failure, unspecified: Secondary | ICD-10-CM

## 2023-09-19 DIAGNOSIS — Z7982 Long term (current) use of aspirin: Secondary | ICD-10-CM | POA: Insufficient documentation

## 2023-09-19 DIAGNOSIS — I480 Paroxysmal atrial fibrillation: Secondary | ICD-10-CM | POA: Diagnosis not present

## 2023-09-19 DIAGNOSIS — J441 Chronic obstructive pulmonary disease with (acute) exacerbation: Secondary | ICD-10-CM | POA: Insufficient documentation

## 2023-09-19 DIAGNOSIS — Z79899 Other long term (current) drug therapy: Secondary | ICD-10-CM | POA: Insufficient documentation

## 2023-09-19 DIAGNOSIS — R0789 Other chest pain: Secondary | ICD-10-CM | POA: Diagnosis not present

## 2023-09-19 DIAGNOSIS — E1110 Type 2 diabetes mellitus with ketoacidosis without coma: Secondary | ICD-10-CM | POA: Diagnosis not present

## 2023-09-19 DIAGNOSIS — N186 End stage renal disease: Secondary | ICD-10-CM | POA: Insufficient documentation

## 2023-09-19 DIAGNOSIS — J45909 Unspecified asthma, uncomplicated: Secondary | ICD-10-CM | POA: Diagnosis not present

## 2023-09-19 DIAGNOSIS — I2510 Atherosclerotic heart disease of native coronary artery without angina pectoris: Secondary | ICD-10-CM | POA: Insufficient documentation

## 2023-09-19 DIAGNOSIS — E0965 Drug or chemical induced diabetes mellitus with hyperglycemia: Secondary | ICD-10-CM | POA: Diagnosis not present

## 2023-09-19 DIAGNOSIS — I130 Hypertensive heart and chronic kidney disease with heart failure and stage 1 through stage 4 chronic kidney disease, or unspecified chronic kidney disease: Secondary | ICD-10-CM | POA: Insufficient documentation

## 2023-09-19 DIAGNOSIS — Z87891 Personal history of nicotine dependence: Secondary | ICD-10-CM | POA: Insufficient documentation

## 2023-09-19 DIAGNOSIS — E1165 Type 2 diabetes mellitus with hyperglycemia: Secondary | ICD-10-CM | POA: Diagnosis present

## 2023-09-19 DIAGNOSIS — I5030 Unspecified diastolic (congestive) heart failure: Secondary | ICD-10-CM | POA: Insufficient documentation

## 2023-09-19 DIAGNOSIS — R000 Tachycardia, unspecified: Secondary | ICD-10-CM | POA: Diagnosis not present

## 2023-09-19 DIAGNOSIS — E1365 Other specified diabetes mellitus with hyperglycemia: Secondary | ICD-10-CM | POA: Diagnosis not present

## 2023-09-19 DIAGNOSIS — R079 Chest pain, unspecified: Secondary | ICD-10-CM | POA: Diagnosis not present

## 2023-09-19 DIAGNOSIS — R739 Hyperglycemia, unspecified: Secondary | ICD-10-CM | POA: Diagnosis present

## 2023-09-19 LAB — BASIC METABOLIC PANEL WITH GFR
Anion gap: 18 — ABNORMAL HIGH (ref 5–15)
Anion gap: 12 (ref 5–15)
BUN: 17 mg/dL (ref 6–20)
BUN: 15 mg/dL (ref 6–20)
CO2: 15 mmol/L — ABNORMAL LOW (ref 22–32)
CO2: 18 mmol/L — ABNORMAL LOW (ref 22–32)
Calcium: 8.8 mg/dL — ABNORMAL LOW (ref 8.9–10.3)
Calcium: 8.2 mg/dL — ABNORMAL LOW (ref 8.9–10.3)
Chloride: 103 mmol/L (ref 98–111)
Chloride: 111 mmol/L (ref 98–111)
Creatinine, Ser: 1.6 mg/dL — ABNORMAL HIGH (ref 0.61–1.24)
Creatinine, Ser: 0.7 mg/dL (ref 0.61–1.24)
GFR, Estimated: 50 mL/min — ABNORMAL LOW (ref >60)
GFR, Estimated: >60 mL/min (ref >60)
Glucose, Bld: 463 mg/dL — ABNORMAL HIGH (ref 70–99)
Glucose, Bld: 150 mg/dL — ABNORMAL HIGH (ref 70–99)
Potassium: 3.6 mmol/L (ref 3.5–5.1)
Potassium: 3.6 mmol/L (ref 3.5–5.1)
Sodium: 136 mmol/L (ref 135–145)
Sodium: 141 mmol/L (ref 135–145)

## 2023-09-19 LAB — URINALYSIS, ROUTINE W REFLEX MICROSCOPIC
Bacteria, UA: NONE SEEN
Bilirubin Urine: NEGATIVE
Glucose, UA: >=500 mg/dL — AB
Hgb urine dipstick: NEGATIVE
Ketones, ur: 5 mg/dL — AB
Leukocytes,Ua: NEGATIVE
Nitrite: NEGATIVE
Protein, ur: NEGATIVE mg/dL
Specific Gravity, Urine: 1.03 (ref 1.005–1.030)
Squamous Epithelial / HPF: 0 /HPF (ref 0–5)
pH: 5 (ref 5.0–8.0)

## 2023-09-19 LAB — CBC
HCT: 42 % (ref 39.0–52.0)
Hemoglobin: 13.3 g/dL (ref 13.0–17.0)
MCH: 28.9 pg (ref 26.0–34.0)
MCHC: 31.7 g/dL (ref 30.0–36.0)
MCV: 91.1 fL (ref 80.0–100.0)
Platelets: 406 K/uL — ABNORMAL HIGH (ref 150–400)
RBC: 4.61 MIL/uL (ref 4.22–5.81)
RDW: 14.4 % (ref 11.5–15.5)
WBC: 8.3 K/uL (ref 4.0–10.5)
nRBC: 0 % (ref 0.0–0.2)

## 2023-09-19 LAB — BLOOD GAS, VENOUS
Acid-base deficit: 8.2 mmol/L — ABNORMAL HIGH (ref 0.0–2.0)
Bicarbonate: 17.1 mmol/L — ABNORMAL LOW (ref 20.0–28.0)
O2 Saturation: 89.8 %
Patient temperature: 37
pCO2, Ven: 34 mmHg — ABNORMAL LOW (ref 44–60)
pH, Ven: 7.31 (ref 7.25–7.43)
pO2, Ven: 58 mmHg — ABNORMAL HIGH (ref 32–45)

## 2023-09-19 LAB — CBG MONITORING, ED
Glucose-Capillary: 467 mg/dL — ABNORMAL HIGH (ref 70–99)
Glucose-Capillary: 329 mg/dL — ABNORMAL HIGH (ref 70–99)
Glucose-Capillary: 228 mg/dL — ABNORMAL HIGH (ref 70–99)
Glucose-Capillary: 255 mg/dL — ABNORMAL HIGH (ref 70–99)
Glucose-Capillary: 186 mg/dL — ABNORMAL HIGH (ref 70–99)
Glucose-Capillary: 140 mg/dL — ABNORMAL HIGH (ref 70–99)
Glucose-Capillary: 179 mg/dL — ABNORMAL HIGH (ref 70–99)
Glucose-Capillary: 145 mg/dL — ABNORMAL HIGH (ref 70–99)
Glucose-Capillary: 200 mg/dL — ABNORMAL HIGH (ref 70–99)
Glucose-Capillary: 194 mg/dL — ABNORMAL HIGH (ref 70–99)

## 2023-09-19 LAB — BETA-HYDROXYBUTYRIC ACID: Beta-Hydroxybutyric Acid: 0.22 mmol/L (ref 0.05–0.27)

## 2023-09-19 LAB — TROPONIN I (HIGH SENSITIVITY)
Troponin I (High Sensitivity): 3 ng/L (ref <18)
Troponin I (High Sensitivity): 5 ng/L (ref <18)

## 2023-09-19 MED ORDER — POTASSIUM CHLORIDE 10 MEQ/100ML IV SOLN
10.0000 meq | INTRAVENOUS | Status: AC
  Administered 2023-09-19 (×2): 10 meq via INTRAVENOUS
  Filled 2023-09-19 (×2): qty 100

## 2023-09-19 MED ORDER — ATORVASTATIN CALCIUM 20 MG PO TABS
20.0000 mg | ORAL_TABLET | Freq: Every day | ORAL | Status: DC
  Administered 2023-09-19: 20 mg via ORAL
  Filled 2023-09-19: qty 1

## 2023-09-19 MED ORDER — DIGOXIN 250 MCG PO TABS
0.2500 mg | ORAL_TABLET | Freq: Every day | ORAL | Status: DC
  Administered 2023-09-19: 0.25 mg via ORAL
  Filled 2023-09-19: qty 1

## 2023-09-19 MED ORDER — EMPAGLIFLOZIN 25 MG PO TABS
25.0000 mg | ORAL_TABLET | Freq: Every day | ORAL | Status: DC
  Administered 2023-09-19: 25 mg via ORAL
  Filled 2023-09-19: qty 1

## 2023-09-19 MED ORDER — PREDNISONE 20 MG PO TABS
40.0000 mg | ORAL_TABLET | Freq: Every day | ORAL | Status: DC
  Administered 2023-09-19: 40 mg via ORAL
  Filled 2023-09-19: qty 2

## 2023-09-19 MED ORDER — SODIUM CHLORIDE 0.9 % IV BOLUS (SEPSIS)
1000.0000 mL | Freq: Once | INTRAVENOUS | Status: AC
  Administered 2023-09-19: 1000 mL via INTRAVENOUS

## 2023-09-19 MED ORDER — INSULIN REGULAR(HUMAN) IN NACL 100-0.9 UT/100ML-% IV SOLN
INTRAVENOUS | Status: DC
  Administered 2023-09-19: 10.5 [IU]/h via INTRAVENOUS
  Filled 2023-09-19: qty 100

## 2023-09-19 MED ORDER — CLOPIDOGREL BISULFATE 75 MG PO TABS
75.0000 mg | ORAL_TABLET | Freq: Every day | ORAL | Status: DC
  Administered 2023-09-19: 75 mg via ORAL
  Filled 2023-09-19: qty 1

## 2023-09-19 MED ORDER — IPRATROPIUM-ALBUTEROL 0.5-2.5 (3) MG/3ML IN SOLN: 3.0000 mL | Freq: Four times a day (QID) | RESPIRATORY_TRACT | 1 refills | Status: DC

## 2023-09-19 MED ORDER — INSULIN ASPART 100 UNIT/ML IJ SOLN
0.0000 [IU] | Freq: Three times a day (TID) | INTRAMUSCULAR | Status: DC
  Administered 2023-09-19: 3 [IU] via SUBCUTANEOUS
  Filled 2023-09-19: qty 1

## 2023-09-19 MED ORDER — LACTATED RINGERS IV SOLN: INTRAVENOUS | Status: DC

## 2023-09-19 MED ORDER — BUDESON-GLYCOPYRROL-FORMOTEROL 160-9-4.8 MCG/ACT IN AERO: 2.0000 | INHALATION_SPRAY | Freq: Two times a day (BID) | RESPIRATORY_TRACT | 1 refills | Status: AC

## 2023-09-19 MED ORDER — BUDESON-GLYCOPYRROL-FORMOTEROL 160-9-4.8 MCG/ACT IN AERO
2.0000 | INHALATION_SPRAY | Freq: Two times a day (BID) | RESPIRATORY_TRACT | Status: DC
  Administered 2023-09-19: 2 via RESPIRATORY_TRACT
  Filled 2023-09-19: qty 5.9

## 2023-09-19 MED ORDER — IPRATROPIUM-ALBUTEROL 0.5-2.5 (3) MG/3ML IN SOLN
3.0000 mL | Freq: Four times a day (QID) | RESPIRATORY_TRACT | Status: DC
  Administered 2023-09-19: 3 mL via RESPIRATORY_TRACT
  Filled 2023-09-19: qty 3

## 2023-09-19 MED ORDER — TAMSULOSIN HCL 0.4 MG PO CAPS: 0.4000 mg | ORAL_CAPSULE | Freq: Every day | ORAL | Status: DC

## 2023-09-19 MED ORDER — DEXTROSE 50 % IV SOLN: 0.0000 mL | INTRAVENOUS | Status: DC | PRN

## 2023-09-19 MED ORDER — ALBUTEROL SULFATE (2.5 MG/3ML) 0.083% IN NEBU: 2.5000 mg | INHALATION_SOLUTION | Freq: Four times a day (QID) | RESPIRATORY_TRACT | Status: DC | PRN

## 2023-09-19 MED ORDER — ENOXAPARIN SODIUM 40 MG/0.4ML IJ SOSY: 40.0000 mg | PREFILLED_SYRINGE | INTRAMUSCULAR | Status: DC

## 2023-09-19 MED ORDER — ASPIRIN 81 MG PO TBEC
81.0000 mg | DELAYED_RELEASE_TABLET | Freq: Every day | ORAL | Status: DC
  Administered 2023-09-19: 81 mg via ORAL
  Filled 2023-09-19: qty 1

## 2023-09-19 MED ORDER — DEXTROSE IN LACTATED RINGERS 5 % IV SOLN: INTRAVENOUS | Status: DC

## 2023-09-19 MED ORDER — PREDNISONE 50 MG PO TABS: 50.0000 mg | ORAL_TABLET | Freq: Every day | ORAL | 0 refills | Status: AC

## 2023-09-19 MED ORDER — MIDODRINE HCL 5 MG PO TABS
10.0000 mg | ORAL_TABLET | Freq: Every morning | ORAL | Status: DC
  Administered 2023-09-19: 10 mg via ORAL
  Filled 2023-09-19: qty 2

## 2023-09-19 MED ORDER — GLIMEPIRIDE 1 MG PO TABS: 1.0000 mg | ORAL_TABLET | Freq: Every day | ORAL | 0 refills | Status: DC

## 2023-09-19 MED ORDER — EMPAGLIFLOZIN 25 MG PO TABS
25.0000 mg | ORAL_TABLET | Freq: Every day | ORAL | Status: DC
  Filled 2023-09-19: qty 1

## 2023-09-19 MED ORDER — FAMOTIDINE 20 MG PO TABS: 20.0000 mg | ORAL_TABLET | Freq: Every day | ORAL | Status: DC

## 2023-09-19 MED ORDER — SODIUM CHLORIDE 0.9 % IV BOLUS
1000.0000 mL | Freq: Once | INTRAVENOUS | Status: AC
  Administered 2023-09-19: 1000 mL via INTRAVENOUS

## 2023-09-19 MED ORDER — GLIMEPIRIDE 1 MG PO TABS
1.0000 mg | ORAL_TABLET | Freq: Every day | ORAL | Status: DC
  Administered 2023-09-19: 1 mg via ORAL
  Filled 2023-09-19: qty 1

## 2023-09-19 NOTE — ED Provider Notes (Addendum)
 [facility] Provider Note    Event Date/Time   First MD Initiated Contact with Patient 09/19/23 [phone]     (approximate)   History   Chest Pain   HPI  Benjamin Conley is a 58 y.o. male   Past medical history of type II diabetic, COPD, CAD, hypertension hyperlipidemia, who presents to the Emergency Department with high blood sugar  He was seen in the emergency department yesterday for COPD exacerbation and got nebulizer treatments and steroids.  His breathing felt a lot better after the treatment.  His breathing has felt better after inhaler treatments at home, albeit transiently.  He has been checking his blood sugar and has been running very high.  That brought him back to the emergency department.  He has had no cough or productive sputum nor fevers.   External Medical Documents Reviewed: Evaluation yesterday in the emergency department with COPD exacerbation found to be wheezing and given nebulizers and steroids      Physical Exam   Triage Vital Signs: ED Triage Vitals  Encounter Vitals Group     BP 09/19/23 0137 (!) 162/91     Girls Systolic BP Percentile --      Girls Diastolic BP Percentile --      Boys Systolic BP Percentile --      Boys Diastolic BP Percentile --      Pulse Rate 09/19/23 0137 (!) 120     Resp 09/19/23 0137 20     Temp 09/19/23 0137 97.6 F (36.4 C)     Temp src --      SpO2 09/19/23 0137 100 %     Weight 09/19/23 0136 168 lb (76.2 kg)     Height 09/19/23 0136 5' 4 (1.626 m)     Head Circumference --      Peak Flow --      Pain Score 09/19/23 0135 4     Pain Loc --      Pain Education --      Exclude from Growth Chart --     Most recent vital signs: Vitals:   09/19/23 0137 09/19/23 0546  BP: (!) 162/91 135/76  Pulse: (!) 120 (!) 107  Resp: 20 19  Temp: 97.6 F (36.4 C) 97.9 F (36.6 C)  SpO2: 100% 97%    General: Awake, no distress.  CV:  Good peripheral perfusion.  Resp:  Normal  effort. Abd:  No distention.  Other:  No respiratory distress.  Tachycardic in the low 100s.  Hypertensive.  No hypoxemia.  No significant rales wheezing or focalities on auscultation of the lung.  Awake alert pleasant gentleman oriented   ED Results / Procedures / Treatments   Labs (all labs ordered are listed, but only abnormal results are displayed) Labs Reviewed  BASIC METABOLIC PANEL WITH GFR - Abnormal; Notable for the following components:      Result Value   CO2 15 (*)    Glucose, Bld 463 (*)    Creatinine, Ser 1.60 (*)    Calcium  8.8 (*)    GFR, Estimated 50 (*)    Anion gap 18 (*)    All other components within normal limits  CBC - Abnormal; Notable for the following components:   Platelets 406 (*)    All other components within normal limits  BLOOD GAS, VENOUS - Abnormal; Notable for the following components:   pCO2, Ven 34 (*)    pO2, Ven 58 (*)    Bicarbonate 17.1 (*)  Acid-base deficit 8.2 (*)    All other components within normal limits  URINALYSIS, ROUTINE W REFLEX MICROSCOPIC - Abnormal; Notable for the following components:   Color, Urine COLORLESS (*)    APPearance CLEAR (*)    Glucose, UA >=500 (*)    Ketones, ur 5 (*)    All other components within normal limits  CBG MONITORING, ED - Abnormal; Notable for the following components:   Glucose-Capillary 467 (*)    All other components within normal limits  CBG MONITORING, ED - Abnormal; Notable for the following components:   Glucose-Capillary 329 (*)    All other components within normal limits  CBG MONITORING, ED - Abnormal; Notable for the following components:   Glucose-Capillary 255 (*)    All other components within normal limits  BETA-HYDROXYBUTYRIC ACID  TROPONIN I (HIGH SENSITIVITY)  TROPONIN I (HIGH SENSITIVITY)     I ordered and reviewed the above labs they are notable for glucose in the 400s with an elevated anion gap and bicarb of 15.  EKG  ED ECG REPORT I, Ginnie Shams, the  attending physician, personally viewed and interpreted this ECG.   Date: 09/19/2023  EKG Time: 0139  Rate: 124  Rhythm: sinus tachycardia  Axis: nl,  Intervals:nl  ST&T Change: no stemi   PROCEDURES:  Critical Care performed: Yes, see critical care procedure note(s)  .Critical Care  Performed by: Shams Ginnie, MD Authorized by: Shams Ginnie, MD   Critical care provider statement:    Critical care time (minutes):  30   Critical care was time spent personally by me on the following activities:  Development of treatment plan with patient or surrogate, discussions with consultants, evaluation of patient's response to treatment, examination of patient, ordering and review of laboratory studies, ordering and review of radiographic studies, ordering and performing treatments and interventions, pulse oximetry, re-evaluation of patient's condition and review of old charts    MEDICATIONS ORDERED IN ED: Medications  insulin  regular, human (MYXREDLIN ) 100 units/ 100 mL infusion (10.5 Units/hr Intravenous New Bag/Given 09/19/23 0541)  lactated ringers  infusion (has no administration in time range)  dextrose  5 % in lactated ringers  infusion (has no administration in time range)  dextrose  50 % solution 0-50 mL (has no administration in time range)  potassium chloride  10 mEq in 100 mL IVPB (10 mEq Intravenous New Bag/Given 09/19/23 0546)  sodium chloride  0.9 % bolus 1,000 mL (0 mLs Intravenous Stopped 09/19/23 0359)  sodium chloride  0.9 % bolus 1,000 mL (1,000 mLs Intravenous New Bag/Given 09/19/23 0542)    External physician / consultants:  I spoke with hospital medicine for admission and regarding care plan for this patient.   IMPRESSION / MDM / ASSESSMENT AND PLAN / ED COURSE  I reviewed the triage vital signs and the nursing notes.                                Patient's presentation is most consistent with acute presentation with potential threat to life or bodily  function.  Differential diagnosis includes, but is not limited to, DKA, COPD exacerbation, CHF exacerbation, ACS, PE   The patient is on the cardiac monitor to evaluate for evidence of arrhythmia and/or significant heart rate changes.  MDM:    He is in mild DKA probably due to the steroid that he got yesterday for his COPD exacerbation.  He continues to feel mildly short of breath but I do think that  his shortness of breath was due to his COPD exacerbation (rather than PE, CHF, ACS) given wheezing on auscultation noted by physician yesterday, and the patient's report that he does have significant albeit transient relief of symptoms with bronchodilators.  Considered ACS given nonischemic EKG and multiple troponins flat, I think less likely.  Considered respiratory infection but doubtful given no cough or productive sputum no fevers and no leukocytosis.  Started on Endo tool DKA treatment, given fluids, admission to hospitalist service.       FINAL CLINICAL IMPRESSION(S) / ED DIAGNOSES   Final diagnoses:  Diabetic ketoacidosis without coma associated with type 2 diabetes mellitus (HCC)  AKI (acute kidney injury) (HCC)     Rx / DC Orders   ED Discharge Orders     None        Note:  This document was prepared using Dragon voice recognition software and may include unintentional dictation errors.    Cyrena Mylar, MD 09/19/23 9475    Cyrena Mylar, MD 09/19/23 [phone]

## 2023-09-19 NOTE — H&P (Addendum)
 History and Physical    Benjamin Conley FMW:[id number] DOB: 1965/03/21 DOA: 09/19/2023  PCP: Dineen Rollene MATSU, [name] (Confirm with patient/family/NH records and if not entered, this has to be entered at [facility] point of entry) Patient coming from: Home  I have personally briefly reviewed patient's old medical records in [facility]  Chief Complaint: Feeling ok  HPI: Benjamin Conley is a 58 y.o. male with medical history significant of COPD, IIDM, HDN, history of splenic infarct status post stenting in 2021, cigar smoking, orthostatic hypotension on midodrine , presented with chest tightness and shortness of breath.  Symptoms started yesterday, patient started to feel tightness in the chest and cannot take deep breath but denies any cough or wheezing.  He came to ER initially yesterday afternoon, was diagnosed with COPD exacerbation, was given IV steroid, DuoNeb treatment and nitroglycerin  and sent home.  Overnight, patient went home and found his glucose more than 500 and continued to experience tightness in the chest and came back to ED.  ED workup found glucose more than 500, and acidosis bicarb 15, AKI and patient was started on IV fluid and insulin  drip.  VBG 7.3 1/34/58.   Review of Systems: As per HPI otherwise 14 point review of systems negative.    Past Medical History:  Diagnosis Date   Acute posthemorrhagic anemia    Allergy 2015   Arthritis    Asthma 2008   Carotid artery stenosis    bilateral   Complication of anesthesia    c/o difficulty breathing after anesthesia   COPD (chronic obstructive pulmonary disease) (HCC)    Coronary artery disease    Diabetes mellitus without complication (HCC)    GERD (gastroesophageal reflux disease)    Hyperlipemia    Hypertension    Melena 08/21/2019   Paroxysmal supraventricular tachycardia (HCC) 08/19/2019   Postural dizziness with presyncope 08/21/2019   Renal disorder    early stage CKD   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    Tobacco abuse    Ulcer    Upper GI bleed 08/21/2019    Past Surgical History:  Procedure Laterality Date   BACK SURGERY     lumbar   COLONOSCOPY WITH PROPOFOL  N/A 08/24/2019   Procedure: COLONOSCOPY WITH PROPOFOL ;  Surgeon: Jinny Carmine, MD;  Location: [facility] ENDOSCOPY;  Service: Endoscopy;  Laterality: N/A;   ESOPHAGOGASTRODUODENOSCOPY (EGD) WITH PROPOFOL  N/A 08/24/2019   Procedure: ESOPHAGOGASTRODUODENOSCOPY (EGD) WITH PROPOFOL ;  Surgeon: Jinny Carmine, MD;  Location: [facility] ENDOSCOPY;  Service: Endoscopy;  Laterality: N/A;   SPINE SURGERY  2008   STENT PLACEMENT VASCULAR ([facility] HX)  07/2019   stenosis of distal splenic artery and stent placed   VISCERAL ANGIOGRAPHY N/A 08/06/2019   Procedure: VISCERAL ANGIOGRAPHY;  Surgeon: Jama Cordella MATSU, MD;  Location: [facility] INVASIVE CV LAB;  Service: Cardiovascular;  Laterality: N/A;     reports that he quit smoking about 3 years ago. His smoking use included cigarettes. He started smoking about 51 years ago. He has a 96 pack-year smoking history. He has never used smokeless tobacco. He reports that he does not drink alcohol and does not use drugs.  Allergies  Allergen Reactions   Wasp Venom Anaphylaxis   Wasp Venom Protein Anaphylaxis   Coffee Flavoring Agent (Non-Screening) Nausea And Vomiting    Patient states coffee gives him nausea and stomach cramps   Onion Nausea And Vomiting    Family History  Problem Relation Age of Onset   Diabetes Mother    Diabetes Mellitus II Mother  Obesity Mother    Tuberculosis Father    Heart disease Father    Diabetes Father    Diabetes Sister    Diabetes Sister    Diabetes Sister    Diabetes Sister    Diabetes Brother    Diabetes Mellitus II Brother    Hyperlipidemia Brother    Obesity Brother    Cancer Maternal Aunt    Colon cancer Maternal Uncle    Obesity Sister    Obesity Sister    Obesity Sister    Obesity Sister    Prostate cancer Neg Hx      Prior to Admission medications    Medication Sig Start Date End Date Taking? Authorizing Provider  albuterol  (VENTOLIN  HFA) 108 (90 Base) MCG/ACT inhaler Inhale 2 puffs into the lungs every 6 (six) hours as needed for wheezing or shortness of breath. 09/03/23  Yes Kasa, Nickolas, MD  aspirin  81 MG EC tablet Take 1 tablet (81 mg total) by mouth daily. 10/16/20  Yes Marylynn Verneita CROME, MD  atorvastatin  (LIPITOR) 40 MG tablet Take 1 tablet (40 mg total) by mouth daily. Patient taking differently: Take 20 mg by mouth daily. 05/10/22  Yes Arnett, Rollene MATSU, [name]  ciprofloxacin  (CIPRO ) 500 MG tablet Take 500 mg by mouth 2 (two) times daily.   Yes [provider]  clopidogrel  (PLAVIX ) 75 MG tablet Take 1 tablet by mouth once daily 03/13/23  Yes Brown, Fallon E, NP  cyanocobalamin  (VITAMIN B12) 1000 MCG tablet Take 1,000 mcg by mouth daily.   Yes [provider]  digoxin  (LANOXIN ) 0.25 MG tablet Take 1 tablet (0.25 mg total) by mouth daily. 07/30/23  Yes Darliss Rogue, MD  empagliflozin  (JARDIANCE ) 25 MG TABS tablet Take 1 tablet (25 mg total) by mouth daily before breakfast. 12/02/22  Yes Arnett, Rollene MATSU, [name]  famotidine  (PEPCID ) 20 MG tablet Take 1 tablet (20 mg total) by mouth at bedtime. 06/12/23  Yes Arnett, Rollene MATSU, [name]  midodrine  (PROAMATINE ) 10 MG tablet Take 10 mg by mouth in the morning. 03/10/23  Yes [provider]  Semaglutide , 2 MG/DOSE, (OZEMPIC , 2 MG/DOSE,) 8 MG/3ML SOPN INJECT 2 MG SUBCUTANEOUSLY ONCE A WEEK 08/26/23  Yes Arnett, Rollene MATSU, [name]  silodosin  (RAPAFLO ) 8 MG CAPS capsule TAKE 1 CAPSULE BY MOUTH ONCE DAILY WITH BREAKFAST 08/14/23  Yes McGowan, Clotilda A, PA-C  blood glucose meter kit and supplies Dispense based on patient and insurance preference. Use up to four times daily as directed. (FOR ICD-10 E10.9, E11.9). 01/28/20   Moishe Suzen LABOR, NP  Continuous Glucose Sensor (FREESTYLE LIBRE 3 SENSOR) MISC USE ONE SENSOR EVERY 14 DAYS TO CHECK GLUCOSE CONTINOUSLY 06/26/23   Dineen Rollene MATSU, [name]   isosorbide  mononitrate (IMDUR ) 30 MG 24 hr tablet Take 1 tablet (30 mg total) by mouth daily. Patient not taking: Reported on 09/19/2023 08/30/23 09/29/23  Willo Dunnings, MD  nystatin -triamcinolone  ointment [facility]) Apply 1 application topically 2 (two) times daily. Patient not taking: Reported on 09/19/2023 06/16/20   Flinchum, Rosaline RAMAN, [name]  predniSONE  (DELTASONE ) 50 MG tablet Take 1 tablet (50 mg total) by mouth daily for 4 days. Patient not taking: Reported on 09/19/2023 09/18/23 09/22/23  Claudene Rover, MD  sildenafil  (VIAGRA ) 100 MG tablet Take 1 tablet (100 mg total) by mouth daily as needed for erectile dysfunction. Patient not taking: Reported on 09/19/2023 12/02/19   Helon Clotilda A, PA-C  triamcinolone  cream (KENALOG ) 0.1 % Apply 1 application topically 2 (two) times daily. 03/08/21   Flinchum, Rosaline  S, [name]    Physical Exam: Vitals:   09/19/23 0136 09/19/23 0137 09/19/23 0546  BP:  (!) 162/91 135/76  Pulse:  (!) 120 (!) 107  Resp:  20 19  Temp:  97.6 F (36.4 C) 97.9 F (36.6 C)  TempSrc:   Oral  SpO2:  100% 97%  Weight: 76.2 kg    Height: 5' 4 (1.626 m)      Constitutional: NAD, calm, comfortable Vitals:   09/19/23 0136 09/19/23 0137 09/19/23 0546  BP:  (!) 162/91 135/76  Pulse:  (!) 120 (!) 107  Resp:  20 19  Temp:  97.6 F (36.4 C) 97.9 F (36.6 C)  TempSrc:   Oral  SpO2:  100% 97%  Weight: 76.2 kg    Height: 5' 4 (1.626 m)     Eyes: PERRL, lids and conjunctivae normal ENMT: Mucous membranes are moist. Posterior pharynx clear of any exudate or lesions.Normal dentition.  Neck: normal, supple, no masses, no thyromegaly Respiratory: Diminished breathing sound bilaterally, scattered wheezing, no crackles. Normal respiratory effort. No accessory muscle use.  Cardiovascular: Regular rate and rhythm, no murmurs / rubs / gallops. No extremity edema. 2+ pedal pulses. No carotid bruits.  Abdomen: no tenderness, no masses palpated. No hepatosplenomegaly. Bowel  sounds positive.  Musculoskeletal: no clubbing / cyanosis. No joint deformity upper and lower extremities. Good ROM, no contractures. Normal muscle tone.  Skin: no rashes, lesions, ulcers. No induration Neurologic: CN 2-12 grossly intact. Sensation intact, DTR normal. Strength 5/5 in all 4.  Psychiatric: Normal judgment and insight. Alert and oriented x 3. Normal mood.     Labs on Admission: I have personally reviewed following labs and imaging studies  CBC: Recent Labs  Lab 09/18/23 1336 09/19/23 0138  WBC 7.2 8.3  HGB 13.9 13.3  HCT 42.5 42.0  MCV 89.7 91.1  PLT 397 406*   Basic Metabolic Panel: Recent Labs  Lab 09/18/23 1336 09/18/23 1608 09/19/23 0138 09/19/23 0833  NA 138  --  136 141  K 3.4*  --  3.6 3.6  CL 104  --  103 111  CO2 23  --  15* 18*  GLUCOSE 176*  --  463* 150*  BUN 16  --  17 15  CREATININE 0.82  --  1.60* 0.70  CALCIUM  9.1  --  8.8* 8.2*  MG  --  2.1  --   --    GFR: Estimated Creatinine Clearance: 95.1 mL/min (by C-G formula based on SCr of 0.7 mg/dL). Liver Function Tests: No results for input(s): AST, ALT, ALKPHOS, BILITOT, PROT, ALBUMIN in the last 168 hours. No results for input(s): LIPASE, AMYLASE in the last 168 hours. No results for input(s): AMMONIA in the last 168 hours. Coagulation Profile: No results for input(s): INR, PROTIME in the last 168 hours. Cardiac Enzymes: No results for input(s): CKTOTAL, CKMB, CKMBINDEX, TROPONINI in the last 168 hours. BNP (last 3 results) No results for input(s): PROBNP in the last 8760 hours. HbA1C: No results for input(s): HGBA1C in the last 72 hours. CBG: Recent Labs  Lab 09/19/23 0611 09/19/23 0658 09/19/23 0750 09/19/23 0829 09/19/23 0938  GLUCAP 228* 186* 179* 140* 145*   Lipid Profile: No results for input(s): CHOL, HDL, LDLCALC, TRIG, CHOLHDL, LDLDIRECT in the last 72 hours. Thyroid  Function Tests: No results for input(s): TSH,  T4TOTAL, FREET4, T3FREE, THYROIDAB in the last 72 hours. Anemia Panel: No results for input(s): VITAMINB12, FOLATE, FERRITIN, TIBC, IRON , RETICCTPCT in the last 72 hours. Urine analysis:  Component Value Date/Time   COLORURINE COLORLESS (A) 09/19/2023 0255   APPEARANCEUR CLEAR (A) 09/19/2023 0255   APPEARANCEUR Clear 01/14/2020 1057   LABSPEC 1.030 09/19/2023 0255   PHURINE 5.0 09/19/2023 0255   GLUCOSEU >=500 (A) 09/19/2023 0255   GLUCOSEU >=1000 (A) 05/15/2022 0850   HGBUR NEGATIVE 09/19/2023 0255   BILIRUBINUR NEGATIVE 09/19/2023 0255   BILIRUBINUR Negative 01/14/2020 1057   KETONESUR 5 (A) 09/19/2023 0255   PROTEINUR NEGATIVE 09/19/2023 0255   UROBILINOGEN 0.2 05/15/2022 0850   NITRITE NEGATIVE 09/19/2023 0255   LEUKOCYTESUR NEGATIVE 09/19/2023 0255    Radiological Exams on Admission: DG Chest 2 View Result Date: 09/18/2023 CLINICAL DATA:  Chest pain EXAM: CHEST - 2 VIEW COMPARISON:  August 30, 2023 chest radiograph FINDINGS: The heart size and mediastinal contours are within normal limits. Both lungs are clear. The visualized skeletal structures are unremarkable. IMPRESSION: No active cardiopulmonary disease. Electronically Signed   By: Michaeline Blanch M.D.   On: 09/18/2023 14:05    EKG: Independently reviewed.  Sinus tachycardia, no acute ST changes.  Assessment/Plan Principal Problem:   DKA (diabetic ketoacidosis) (HCC) Active Problems:   COPD with acute exacerbation (HCC)  (please populate well all problems here in Problem List. (For example, if patient is on BP meds at home and you resume or decide to hold them, it is a problem that needs to be her. Same for CAD, COPD, HLD and so on)  Hyperglycemia Impending DKA IIDM -Likely iatrogenic secondary to IV steroids, as patient glucose has been fairly controlled with Jardiance  only and most recent A1c 7.2, about 2 months ago. - Glucose significantly improved this morning along with improvement of acidosis.   Will discontinue insulin  drip. - Given that the patient will be on short course of p.o. prednisone , will resume Jardiance  and add glimepiride .  Will check glucose several times this morning and this afternoon before discharge.  Acute COPD exacerbation - P.o. prednisone  - DuoNebs - Add LABA and ICS - Incentive spirometry  Chest pain - Probably related to COPD exacerbation - Troponin negative x 2 and EKG showed no ischemic ST changes.  Review of patient record showed the patient had a normal stress test last month.  ACS unlikely.  AKI - Likely prerenal, resolved after IV hydration  History of HFpEF - Euvolemic, continue digoxin   History of orthostatic hypotension - Stable, continue midodrine   DVT prophylaxis: Lovenox  Code Status: Full code Family Communication: None at bedside Disposition Plan: Expect less than 2 midnight hospital stay Consults called: None Admission status: MedSurg observation   Cort ONEIDA Mana MD [facility] Pager [phone]  09/19/2023, 9:56 AM

## 2023-09-19 NOTE — ED Triage Notes (Signed)
 Pt was seen yesterday for same, pt c/o chest pain/back pain. Tightness that wraps around to back. Pt reports he was given a steroid shot and that raised his blood sugar.

## 2023-09-19 NOTE — ED Notes (Signed)
 CCMD called to put pt on cardiac monitoring in room 9

## 2023-09-19 NOTE — Inpatient Diabetes Management (Addendum)
 Inpatient Diabetes Program Recommendations  AACE/ADA: New Consensus Statement on Inpatient Glycemic Control (2015)  Target Ranges:  Prepandial:   less than 140 mg/dL      Peak postprandial:   less than 180 mg/dL (1-2 hours)      Critically ill patients:  140 - 180 mg/dL   Lab Results  Component Value Date   GLUCAP 186 (H) 09/19/2023   HGBA1C 7.3 (A) 06/12/2023    Review of Glycemic Control  Latest Reference Range & Units 09/19/23 05:34 09/19/23 06:11 09/19/23 06:58  Glucose-Capillary 70 - 99 mg/dL 744 (H) 771 (H) 813 (H)   Diabetes history: DM 2 Outpatient Diabetes medications:  Ozempic  2 mg daily, Jardiance  25 mg daily, FSL sensor Current orders for Inpatient glycemic control:  IV insulin -  Inpatient Diabetes Program Recommendations:   Note CBG=176 mg/dL prior to receiving Solumedrol on 09/18/23.  Note CBG up to >400 mg/dL this AM and insulin  drip started.  Likely hyperglycemia due to steroids?  A1C indicated fairly good control in April of 2025.  Note that patient is also on SGLT-2 which can increase risk for euglycemic ketoacidosis.  Will follow.   Addendum:  Consider holding Jardiance  and add Semglee  12 units daily (2 hour prior to d/c of insulin  drip) plus Novolog  moderate tid with meals and HS. Per MD plan is for him to discharge on Amaryl  and Jardiance  due to short course of steroids.    Thanks,  Randall Bullocks, RN, BC-ADM Inpatient Diabetes Coordinator Pager [phone]  (8a-5p)

## 2023-09-19 NOTE — Discharge Summary (Signed)
 Physician Discharge Summary   Patient: Benjamin Conley MRN: [id number] DOB: 12-Jul-1965  Admit date:     09/19/2023  Discharge date: 09/19/23  Discharge Physician: Cort ONEIDA Mana   PCP: Dineen Rollene MATSU, [name]   Recommendations at discharge:   PCP 1 week Pulmonary in 1 to 2 weeks  Discharge Diagnoses: Principal Problem:   DKA (diabetic ketoacidosis) (HCC) Active Problems:   COPD with acute exacerbation (HCC)   Hyperglycemia    Hospital Course:  58 year old male patient with past medical history of COPD diabetes insulin -dependent presented with hyperglycemia.  Initially patient came to ED last night before for chest pain, workup was benign and it was found patient was in severe COPD exacerbation, after given IV Solu-Medrol  and breathing treatment patient discharged home.  This morning, patient came back to hospital again complaining about high glucose reading.  Workup found patient was in impending DKA.  Etiology likely iatrogenic hypoglycemia and impending DKA secondary to steroid use to treat COPD.  Patient was started on insulin  drip and glucose level came down.  To treat COPD, patient will be discharged on short course of prednisone .  To cover his hyperglycemia events, I started patient on glimepiride  1 mg in addition to his home dose of Jardiance .  And recommendation of both appear to work well to control his glucose.  I send prescription of up to 5 days worth of glimepiride  to [facility] pharmacy and inform the patient to pick it up.  He will also be discharged on 5 days course of prednisone  50 mg daily.  In addition the dose and ICS plus LABA to pharmacy however patient declined offer.       Pain control - [place]  Controlled Substance Reporting System database was reviewed. and patient was instructed, not to drive, operate heavy machinery, perform activities at heights, swimming or participation in water activities or provide baby-sitting services while on Pain, Sleep and  Anxiety Medications; until their outpatient Physician has advised to do so again. Also recommended to not to take more than prescribed Pain, Sleep and Anxiety Medications.  Consultants: None Procedures performed: None Disposition: Home Diet recommendation:  Carb modified diet DISCHARGE MEDICATION: Allergies as of 09/19/2023       Reactions   Wasp Venom Anaphylaxis   Wasp Venom Protein Anaphylaxis   Coffee Flavoring Agent (non-screening) Nausea And Vomiting   Patient states coffee gives him nausea and stomach cramps   Onion Nausea And Vomiting        Medication List     STOP taking these medications    ciprofloxacin  500 MG tablet Commonly known as: CIPRO    isosorbide  mononitrate 30 MG 24 hr tablet Commonly known as: IMDUR    nystatin -triamcinolone  ointment Commonly known as: MYCOLOG   sildenafil  100 MG tablet Commonly known as: VIAGRA        TAKE these medications    albuterol  108 (90 Base) MCG/ACT inhaler Commonly known as: VENTOLIN  HFA Inhale 2 puffs into the lungs every 6 (six) hours as needed for wheezing or shortness of breath.   aspirin  EC 81 MG tablet Take 1 tablet (81 mg total) by mouth daily.   atorvastatin  40 MG tablet Commonly known as: LIPITOR Take 1 tablet (40 mg total) by mouth daily. What changed: how much to take   blood glucose meter kit and supplies Dispense based on patient and insurance preference. Use up to four times daily as directed. (FOR ICD-10 E10.9, E11.9).   budesonide -glycopyrrolate -formoterol  160-9-4.8 MCG/ACT Aero inhaler Commonly known as: BREZTRI  Inhale 2  puffs into the lungs 2 (two) times daily.   clopidogrel  75 MG tablet Commonly known as: PLAVIX  Take 1 tablet by mouth once daily   cyanocobalamin  1000 MCG tablet Commonly known as: VITAMIN B12 Take 1,000 mcg by mouth daily.   digoxin  0.25 MG tablet Commonly known as: Lanoxin  Take 1 tablet (0.25 mg total) by mouth daily.   empagliflozin  25 MG Tabs tablet Commonly  known as: Jardiance  Take 1 tablet (25 mg total) by mouth daily before breakfast.   famotidine  20 MG tablet Commonly known as: Pepcid  Take 1 tablet (20 mg total) by mouth at bedtime.   FreeStyle Libre 3 Sensor Misc USE ONE SENSOR EVERY 14 DAYS TO CHECK GLUCOSE CONTINOUSLY   glimepiride  1 MG tablet Commonly known as: AMARYL  Take 1 tablet (1 mg total) by mouth daily with breakfast. Start taking on: September 20, 2023   ipratropium-albuterol  0.5-2.5 (3) MG/3ML Soln Commonly known as: DUONEB Take 3 mLs by nebulization every 6 (six) hours.   midodrine  10 MG tablet Commonly known as: PROAMATINE  Take 10 mg by mouth in the morning.   Ozempic  (2 MG/DOSE) 8 MG/3ML Sopn Generic drug: Semaglutide  (2 MG/DOSE) INJECT 2 MG SUBCUTANEOUSLY ONCE A WEEK   predniSONE  50 MG tablet Commonly known as: DELTASONE  Take 1 tablet (50 mg total) by mouth daily for 4 days.   silodosin  8 MG Caps capsule Commonly known as: RAPAFLO  TAKE 1 CAPSULE BY MOUTH ONCE DAILY WITH BREAKFAST   triamcinolone  cream 0.1 % Commonly known as: KENALOG  Apply 1 application topically 2 (two) times daily.        Discharge Exam: Filed Weights   09/19/23 0136  Weight: 76.2 kg   Eyes: PERRL, lids and conjunctivae normal ENMT: Mucous membranes are moist. Posterior pharynx clear of any exudate or lesions.Normal dentition.  Neck: normal, supple, no masses, no thyromegaly Respiratory: clear to auscultation bilaterally, no wheezing, no crackles. Normal respiratory effort. No accessory muscle use.  Cardiovascular: Regular rate and rhythm, no murmurs / rubs / gallops. No extremity edema. 2+ pedal pulses. No carotid bruits.  Abdomen: no tenderness, no masses palpated. No hepatosplenomegaly. Bowel sounds positive.  Musculoskeletal: no clubbing / cyanosis. No joint deformity upper and lower extremities. Good ROM, no contractures. Normal muscle tone.  Skin: no rashes, lesions, ulcers. No induration Neurologic: CN 2-12 grossly intact.  Sensation intact, DTR normal.  Muscle strength 5/5 on both sides Psychiatric: Normal judgment and insight. Alert and oriented x 3. Normal mood.     Condition at discharge: good  The results of significant diagnostics from this hospitalization (including imaging, microbiology, ancillary and laboratory) are listed below for reference.   Imaging Studies: DG Chest 2 View Result Date: 09/18/2023 CLINICAL DATA:  Chest pain EXAM: CHEST - 2 VIEW COMPARISON:  August 30, 2023 chest radiograph FINDINGS: The heart size and mediastinal contours are within normal limits. Both lungs are clear. The visualized skeletal structures are unremarkable. IMPRESSION: No active cardiopulmonary disease. Electronically Signed   By: Michaeline Blanch M.D.   On: 09/18/2023 14:05   MR BRAIN/IAC W WO CONTRAST Result Date: 09/10/2023 EXAM: MR Brain and Internal Auditory Canals without and with contrast 08/29/2023 09:38:01 AM TECHNIQUE: Multiplanar multisequence MRI of the brain and internal auditory canals was performed without and with administration of 7 mL intravenous gadobutrol  (GADAVIST ) 1 MMOL/ML. COMPARISON: MRI brain 08/20/2019 CLINICAL HISTORY: Painful pulsatile sensation in ears. Patient had an episode one month ago with right eye flickering and painful pulsation in right ear, then could hear a pulse in  left ear. Symptoms have resolved. FINDINGS: INTERNAL AUDITORY CANALS: The left AICA displaces the left vestibulocochlear nerve posteriorly in the cerebellopontine angle cistern (axial image 31 series 13). No mass or abnormal enhancement. Inner ear structures are unremarkable. Middle ear cavities are clear. BRAIN AND VENTRICLES: Scattered foci of T2 hyperintensity in the cerebral and central pontine white matter, most consistent with mild chronic small vessel disease. No acute infarct. No acute intracranial hemorrhage. No mass or abnormal enhancement. No midline shift. No hydrocephalus. The sella is unremarkable. Normal flow voids.  ORBITS: No acute abnormality. SINUSES AND MASTOIDS: Clear. BONES AND SOFT TISSUES: Normal bone marrow signal. No acute soft tissue abnormality. IMPRESSION: 1. No acute intracranial abnormality or mass. 2. Left AICA displaces the left vestibulocochlear nerve posteriorly in the cerebellopontine angle cistern, which could be a cause of left-sided pulsatile tinnitus. 3. Mild chronic small vessel disease. Electronically signed by: Ryan Chess MD 09/10/2023 09:58 AM EDT RP Workstation: [id number]   MR ANGIO HEAD WO CONTRAST Result Date: 09/10/2023 EXAM: MR Angiography Head without intravenous Contrast. 08/29/2023 09:38:01 AM TECHNIQUE: Magnetic resonance angiography images of the head without intravenous contrast. Multiplanar 2D and 3D reformatted images are provided for review. COMPARISON: None provided. CLINICAL HISTORY: Tinnitus, pulsatile; painful pulsatile sensation in BL ears. Patient had an episode one month ago with Right eye flickering and painful pulsation in Right ear, then could hear a pulse in Left ear. Symptoms have resolved. FINDINGS: ANTERIOR CIRCULATION: Hypoplastic right A1 segment of the anterior cerebral artery. No significant stenosis of the internal carotid arteries. No significant stenosis of the anterior cerebral arteries. No significant stenosis of the middle cerebral arteries. No aneurysm. POSTERIOR CIRCULATION: No significant stenosis of the posterior cerebral arteries. No significant stenosis of the basilar artery. No significant stenosis of the vertebral arteries. No aneurysm. IMPRESSION: 1. Unremarkable MRA of the head. Electronically signed by: Ryan Chess MD 09/10/2023 09:49 AM EDT RP Workstation: [id number]   DG Chest 2 View Result Date: 08/30/2023 CLINICAL DATA:  Two day history of headache, shortness of breath, and chest pain EXAM: CHEST - 2 VIEW COMPARISON:  Chest radiograph dated 08/07/2023 FINDINGS: Normal lung volumes. No focal consolidations. No pleural effusion or  pneumothorax. The heart size and mediastinal contours are within normal limits. No acute osseous abnormality. IMPRESSION: No active cardiopulmonary disease. Electronically Signed   By: Limin  Xu M.D.   On: 08/30/2023 11:50   NM Myocar Multi W/Spect W/Wall Motion / EF Result Date: 08/21/2023   The study is normal. The study is low risk.   1.0 mm of down sloping ST depression (II, III, aVF and V6) was noted. ECG was interpretable and non-conclusive.   LV perfusion is normal. There is no evidence of ischemia. There is no evidence of infarction.   Left ventricular function is normal. Nuclear stress EF: 69%. End diastolic cavity size is normal. End systolic cavity size is normal.   Coronary calcium  was present on the attenuation correction CT images. Mild coronary calcifications were present.    Microbiology: Results for orders placed or performed during the hospital encounter of 05/23/20  SARS CORONAVIRUS 2 (TAT 6-24 HRS) Nasopharyngeal Nasopharyngeal Swab     Status: Abnormal   Collection Time: 05/23/20  8:35 AM   Specimen: Nasopharyngeal Swab  Result Value Ref Range Status   SARS Coronavirus 2 POSITIVE (A) NEGATIVE Final    Comment: (NOTE) SARS-CoV-2 target nucleic acids are DETECTED.  The SARS-CoV-2 RNA is generally detectable in upper and lower respiratory specimens during the acute phase  of infection. Positive results are indicative of the presence of SARS-CoV-2 RNA. Clinical correlation with patient history and other diagnostic information is  necessary to determine patient infection status. Positive results do not rule out bacterial infection or co-infection with other viruses.  The expected result is Negative.  Fact Sheet for Patients: HairSlick.no  Fact Sheet for Healthcare Providers: quierodirigir.com  This test is not yet approved or cleared by the [place]  FDA and  has been authorized for detection and/or diagnosis of  SARS-CoV-2 by FDA under an Emergency Use Authorization (EUA). This EUA will remain  in effect (meaning this test can be used) for the duration of the COVID-19 declaration under Section 564(b)(1) of the Act, 21 U. S.C. section 360bbb-3(b)(1), unless the authorization is terminated or revoked sooner.   Performed at [facility], [address] [place], [place] [address]     Labs: CBC: Recent Labs  Lab 09/18/23 1336 09/19/23 0138  WBC 7.2 8.3  HGB 13.9 13.3  HCT 42.5 42.0  MCV 89.7 91.1  PLT 397 406*   Basic Metabolic Panel: Recent Labs  Lab 09/18/23 1336 09/18/23 1608 09/19/23 0138 09/19/23 0833  NA 138  --  136 141  K 3.4*  --  3.6 3.6  CL 104  --  103 111  CO2 23  --  15* 18*  GLUCOSE 176*  --  463* 150*  BUN 16  --  17 15  CREATININE 0.82  --  1.60* 0.70  CALCIUM  9.1  --  8.8* 8.2*  MG  --  2.1  --   --    Liver Function Tests: No results for input(s): AST, ALT, ALKPHOS, BILITOT, PROT, ALBUMIN in the last 168 hours. CBG: Recent Labs  Lab 09/19/23 0750 09/19/23 0829 09/19/23 0938 09/19/23 1206 09/19/23 1600  GLUCAP 179* 140* 145* 200* 194*    Discharge time spent: less than 30 minutes.  Signed: Cort ONEIDA Mana, MD [facility] 09/19/2023

## 2023-09-19 NOTE — Care Management Obs Status (Signed)
 MEDICARE OBSERVATION STATUS NOTIFICATION   Patient Details  Name: Benjamin Conley MRN: [id number] Date of Birth: Jul 02, 1965   Medicare Observation Status Notification Given:  Yes    Edsel DELENA Fischer, LCSW 09/19/2023, 10:29 AM

## 2023-09-20 DIAGNOSIS — B370 Candidal stomatitis: Secondary | ICD-10-CM | POA: Diagnosis not present

## 2023-09-22 ENCOUNTER — Telehealth: Payer: Self-pay

## 2023-09-22 ENCOUNTER — Encounter: Payer: Self-pay | Admitting: Family

## 2023-09-22 DIAGNOSIS — J441 Chronic obstructive pulmonary disease with (acute) exacerbation: Secondary | ICD-10-CM

## 2023-09-22 NOTE — Telephone Encounter (Signed)
 Spoke to pt informed him that DME order for nebulizer machine was placed and is up front in brown accordion folder in envelope awaiting pickup by patient

## 2023-09-22 NOTE — Transitions of Care (Post Inpatient/ED Visit) (Signed)
 09/22/2023  Name: Benjamin Conley MRN: [id number] DOB: 1965/12/03  Today's TOC FU Call Status: Today's TOC FU Call Status:: Successful TOC FU Call Completed TOC FU Call Complete Date: 09/22/23 Patient's Name and Date of Birth confirmed.  Transition Care Management Follow-up Telephone Call Date of Discharge: 09/19/23 Discharge Facility: [name]) Type of Discharge: Inpatient Admission Primary Inpatient Discharge Diagnosis:: AKF How have you been since you were released from the hospital?: Better Any questions or concerns?: No  Items Reviewed: Did you receive and understand the discharge instructions provided?: Yes Medications obtained,verified, and reconciled?: Yes (Medications Reviewed) Any new allergies since your discharge?: No Dietary orders reviewed?: Yes Do you have support at home?: Yes People in Home [RPT]: sibling(s)  Medications Reviewed Today: Medications Reviewed Today     Reviewed by Emmitt Pan, LPN (Licensed Practical Nurse) on 09/22/23 at 1648  Med List Status: <None>   Medication Order Taking? Sig Documenting Provider Last Dose Status Informant  albuterol  (VENTOLIN  HFA) 108 (90 Base) MCG/ACT inhaler [id number]  Inhale 2 puffs into the lungs every 6 (six) hours as needed for wheezing or shortness of breath. Kasa, Kurian, MD  Active Self  aspirin  81 MG EC tablet [id number]  Take 1 tablet (81 mg total) by mouth daily. Marylynn Verneita CROME, MD  Active Self  atorvastatin  (LIPITOR) 40 MG tablet [id number] Yes Take 1 tablet (40 mg total) by mouth daily.  Patient taking differently: Take 20 mg by mouth daily.   Benjamin Rollene MATSU, [name]  Active Self  blood glucose meter kit and supplies [id number]  Dispense based on patient and insurance preference. Use up to four times daily as directed. (FOR ICD-10 E10.9, E11.9). Benjamin Suzen LABOR, NP  Active Self  budesonide -glycopyrrolate -formoterol  (BREZTRI ) 160-9-4.8 MCG/ACT AERO inhaler [id number]   Inhale 2 puffs into the lungs 2 (two) times daily. Laurita Cort DASEN, MD  Active   clopidogrel  (PLAVIX ) 75 MG tablet [id number]  Take 1 tablet by mouth once daily Brown, Fallon E, NP  Active Self  Continuous Glucose Sensor (FREESTYLE LIBRE 3 SENSOR) [place] [id number]  USE ONE SENSOR EVERY 14 DAYS TO CHECK GLUCOSE CONTINOUSLY Benjamin Rollene MATSU, [name]  Active Self  cyanocobalamin  (VITAMIN B12) 1000 MCG tablet [id number]  Take 1,000 mcg by mouth daily. [provider]  Active Self  digoxin  (LANOXIN ) 0.25 MG tablet [id number]  Take 1 tablet (0.25 mg total) by mouth daily. Darliss Rogue, MD  Active Self  empagliflozin  (JARDIANCE ) 25 MG TABS tablet [id number]  Take 1 tablet (25 mg total) by mouth daily before breakfast. Benjamin Rollene MATSU, [name]  Active Self  famotidine  (PEPCID ) 20 MG tablet [id number]  Take 1 tablet (20 mg total) by mouth at bedtime. Benjamin Rollene MATSU, [name]  Active Self  glimepiride  (AMARYL ) 1 MG tablet [id number]  Take 1 tablet (1 mg total) by mouth daily with breakfast. Laurita Cort DASEN, MD  Active   ipratropium-albuterol  (DUONEB) 0.5-2.5 (3) MG/3ML SOLN [id number]  Take 3 mLs by nebulization every 6 (six) hours. Laurita Cort DASEN, MD  Active   midodrine  (PROAMATINE ) 10 MG tablet [id number]  Take 10 mg by mouth in the morning. [provider]  Active Self  predniSONE  (DELTASONE ) 50 MG tablet [id number]  Take 1 tablet (50 mg total) by mouth daily for 4 days. Laurita Cort DASEN, MD  Active   Semaglutide , 2 MG/DOSE, (OZEMPIC , 2 MG/DOSE,) 8 MG/3ML SOPN [id number]  INJECT 2 MG SUBCUTANEOUSLY ONCE A WEEK Arnett, Rollene MATSU, [name]  Active Self  Med Note (BEJOS, ANJA J   Fri Sep 19, 2023  6:55 AM) Monday  silodosin  (RAPAFLO ) 8 MG CAPS capsule [id number]  TAKE 1 CAPSULE BY MOUTH ONCE DAILY WITH BREAKFAST McGowan, Shannon A, PA-C  Active Self  triamcinolone  cream (KENALOG ) 0.1 % [id number]  Apply 1 application topically 2 (two) times daily. Flinchum, Rosaline RAMAN, [name]  Active Self             Home Care and Equipment/Supplies: Were Home Health Services Ordered?: NA Any new equipment or medical supplies ordered?: NA  Functional Questionnaire: Do you need assistance with bathing/showering or dressing?: No Do you need assistance with meal preparation?: No Do you need assistance with eating?: No Do you have difficulty maintaining continence: No Do you need assistance with getting out of bed/getting out of a chair/moving?: No Do you have difficulty managing or taking your medications?: No  Follow up appointments reviewed: PCP Follow-up appointment confirmed?: Yes Date of PCP follow-up appointment?: 09/23/23 Follow-up Provider: [facility] Follow-up appointment confirmed?: NA Do you need transportation to your follow-up appointment?: No Do you understand care options if your condition(s) worsen?: Yes-patient verbalized understanding    SIGNATURE Julian Lemmings, LPN [facility] Nurse Health Advisor Direct Dial  [phone]

## 2023-09-22 NOTE — Telephone Encounter (Signed)
PT is scheduled to see you tomorrow

## 2023-09-23 ENCOUNTER — Encounter: Payer: Self-pay | Admitting: Family

## 2023-09-23 ENCOUNTER — Ambulatory Visit: Admitting: Family

## 2023-09-23 ENCOUNTER — Encounter: Attending: Physician Assistant | Admitting: Physician Assistant

## 2023-09-23 ENCOUNTER — Ambulatory Visit: Payer: Self-pay | Admitting: Family

## 2023-09-23 ENCOUNTER — Other Ambulatory Visit: Payer: Self-pay | Admitting: Family

## 2023-09-23 ENCOUNTER — Ambulatory Visit
Admission: RE | Admit: 2023-09-23 | Discharge: 2023-09-23 | Disposition: A | Discharge status: Home / Self Care | Source: Ambulatory Visit | Attending: Family | Admitting: Family

## 2023-09-23 VITALS — BP 130/76 | HR 76 | Temp 97.6°F | Ht 64.0 in | Wt 166.4 lb

## 2023-09-23 DIAGNOSIS — I7772 Dissection of iliac artery: Secondary | ICD-10-CM | POA: Diagnosis not present

## 2023-09-23 DIAGNOSIS — E1110 Type 2 diabetes mellitus with ketoacidosis without coma: Secondary | ICD-10-CM | POA: Diagnosis not present

## 2023-09-23 DIAGNOSIS — E11621 Type 2 diabetes mellitus with foot ulcer: Secondary | ICD-10-CM | POA: Insufficient documentation

## 2023-09-23 DIAGNOSIS — Z7901 Long term (current) use of anticoagulants: Secondary | ICD-10-CM | POA: Diagnosis not present

## 2023-09-23 DIAGNOSIS — R0789 Other chest pain: Secondary | ICD-10-CM

## 2023-09-23 DIAGNOSIS — J4489 Other specified chronic obstructive pulmonary disease: Secondary | ICD-10-CM | POA: Insufficient documentation

## 2023-09-23 DIAGNOSIS — L97512 Non-pressure chronic ulcer of other part of right foot with fat layer exposed: Secondary | ICD-10-CM | POA: Diagnosis not present

## 2023-09-23 DIAGNOSIS — Z87891 Personal history of nicotine dependence: Secondary | ICD-10-CM

## 2023-09-23 DIAGNOSIS — I10 Essential (primary) hypertension: Secondary | ICD-10-CM | POA: Diagnosis not present

## 2023-09-23 DIAGNOSIS — I2510 Atherosclerotic heart disease of native coronary artery without angina pectoris: Secondary | ICD-10-CM | POA: Diagnosis not present

## 2023-09-23 DIAGNOSIS — Z7984 Long term (current) use of oral hypoglycemic drugs: Secondary | ICD-10-CM

## 2023-09-23 DIAGNOSIS — Z794 Long term (current) use of insulin: Secondary | ICD-10-CM | POA: Diagnosis not present

## 2023-09-23 LAB — BASIC METABOLIC PANEL WITH GFR
BUN: 15 mg/dL (ref 6–23)
CO2: 31 meq/L (ref 19–32)
Calcium: 10.2 mg/dL (ref 8.4–10.5)
Chloride: 102 meq/L (ref 96–112)
Creatinine, Ser: 0.66 mg/dL (ref 0.40–1.50)
GFR: 103.81 mL/min (ref >60.00)
Glucose, Bld: 141 mg/dL — ABNORMAL HIGH (ref 70–99)
Potassium: 3.9 meq/L (ref 3.5–5.1)
Sodium: 140 meq/L (ref 135–145)

## 2023-09-23 MED ORDER — IOHEXOL 350 MG/ML SOLN
100.0000 mL | Freq: Once | INTRAVENOUS | Status: AC | PRN
  Administered 2023-09-23: 100 mL via INTRAVENOUS

## 2023-09-23 NOTE — Telephone Encounter (Signed)
 Benjamin Conley has been sent in to the pharmacy

## 2023-09-23 NOTE — Assessment & Plan Note (Addendum)
 Reviewed hospitalization. Medications reconciled.  Continue jardiance  25mg , glimepiride  1mg  .  Reviewed Libre GMI 7.5%, 61% in target range. No hypoglycemic episodes.   No medication changes at this time.

## 2023-09-23 NOTE — Progress Notes (Unsigned)
 Assessment & Plan:  There are no diagnoses linked to this encounter.   Return precautions given.   Risks, benefits, and alternatives of the medications and treatment plan prescribed today were discussed, and patient expressed understanding.   Education regarding symptom management and diagnosis given to patient on AVS either electronically or printed.  No follow-ups on file.  Rollene Northern, [name]  Subjective:    Patient ID: Benjamin Conley, male    DOB: [birth year], 58 y.o.   MRN: [id number]  CC: Benjamin Conley is a 58 y.o. male who presents today for follow up.   HPI: Complains of hyperglyecmia , 476.   CP has resolved. NO CP today  Complaint with tums, pepcid .     He never started Breztri  nor prednisone  after discharge 'My breathing is fine'.  He will be breathing 'heavy' outside in the heat.  He denies fever, cough, chills, left arm numbness.    Compliant with glimepiride  1mg   Complaint with jardiance   He hasn't pick up nebulizer   He has episodic cp 'like a lasso' around his entire chest. Feels like a punch.  Pain is not worse with eating. Walking he feels a chest tightness. He used albuterol  without relief. Pain is associated thoracic back pain like 'someone hitting him'.    Presented to the ED 09/18/23 for CP for one week.  Wheezing on exam. Nebulizer provided, IV solu medrol .  CXR negative 09/18/23 Mild hypokalemia is replaced orally, normal magnesium, 2 negative troponins  Suspected COPD and started on prednisone   Seen at [facility] 09/18/23 and reports walked to [facility] ED due to concern for 'aneurysm' per patient.   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  Allergies: Wasp venom, Wasp venom protein, Coffee flavoring agent (non-screening), and Onion Current Outpatient Medications on File Prior to Visit  Medication Sig Dispense Refill   albuterol  (VENTOLIN  HFA) 108 (90 Base) MCG/ACT inhaler Inhale 2 puffs into the lungs every 6 (six) hours as needed for wheezing or shortness of breath. 8 g 2   aspirin  81 MG EC tablet Take 1 tablet (81 mg total) by mouth daily. 90 tablet 3   atorvastatin  (LIPITOR) 40 MG tablet Take 1 tablet (40 mg total) by mouth daily. (Patient taking differently: Take 20 mg by mouth daily.) 90 tablet 3   blood glucose meter kit and supplies Dispense based on patient and insurance preference. Use up to four times daily as directed. (FOR ICD-10 E10.9, E11.9). 1 each 0   budesonide -glycopyrrolate -formoterol  (BREZTRI ) 160-9-4.8 MCG/ACT AERO inhaler Inhale 2 puffs into the lungs 2 (two) times daily. 10.7 g 1   clopidogrel  (PLAVIX ) 75 MG tablet Take 1 tablet by mouth once daily 90 tablet 1   Continuous Glucose Sensor (FREESTYLE LIBRE 3 SENSOR) MISC USE ONE SENSOR EVERY 14 DAYS TO CHECK GLUCOSE CONTINOUSLY 2 each 2  cyanocobalamin  (VITAMIN B12) 1000 MCG tablet Take 1,000 mcg by mouth daily.     digoxin  (LANOXIN ) 0.25 MG tablet Take 1 tablet (0.25 mg total) by mouth daily. 30 tablet 3   empagliflozin  (JARDIANCE ) 25 MG TABS tablet Take 1 tablet (25 mg total) by mouth daily before breakfast. 90 tablet 3   famotidine  (PEPCID ) 20 MG tablet Take 1 tablet (20 mg total) by mouth at bedtime. 90 tablet 1   glimepiride  (AMARYL ) 1 MG tablet Take 1 tablet (1 mg total) by mouth daily with breakfast. 10  tablet 0   ipratropium-albuterol  (DUONEB) 0.5-2.5 (3) MG/3ML SOLN Take 3 mLs by nebulization every 6 (six) hours. 360 mL 1   midodrine  (PROAMATINE ) 10 MG tablet Take 10 mg by mouth in the morning.     predniSONE  (DELTASONE ) 50 MG tablet Take 1 tablet (50 mg total) by mouth daily for 4 days. 4 tablet 0   Semaglutide , 2 MG/DOSE, (OZEMPIC , 2 MG/DOSE,) 8 MG/3ML SOPN INJECT 2 MG SUBCUTANEOUSLY ONCE A WEEK 9 mL 0   silodosin  (RAPAFLO ) 8 MG CAPS capsule TAKE 1 CAPSULE BY MOUTH ONCE DAILY WITH BREAKFAST 30 capsule 0   triamcinolone  cream (KENALOG ) 0.1 % Apply 1 application topically 2 (two) times daily. 30 g 0   No current facility-administered medications on file prior to visit.    Review of Systems    Objective:    BP 130/76   Pulse 76   Temp 97.6 F (36.4 C) (Oral)   Ht 5' 4 (1.626 m)   Wt 166 lb 6.4 oz (75.5 kg)   SpO2 99%   BMI 28.56 kg/m  BP Readings from Last 3 Encounters:  09/23/23 130/76  09/19/23 124/63  09/18/23 108/68   Wt Readings from Last 3 Encounters:  09/23/23 166 lb 6.4 oz (75.5 kg)  09/19/23 168 lb (76.2 kg)  09/18/23 168 lb (76.2 kg)    Physical Exam

## 2023-09-23 NOTE — Telephone Encounter (Signed)
 Spoke to [facility]   [address], [address], [place], [place] [address]  19 mi [phone].  She stated that they need face to face ov notes to explain why pt needs a nebulizer

## 2023-09-23 NOTE — Telephone Encounter (Signed)
 [facility]  [address], [address], [place], [place] [address]  19 mi [phone]  Please call [facility] and see why pt cannot get nebulizer

## 2023-09-23 NOTE — Assessment & Plan Note (Signed)
 Start duoneb H.o smoking Ordered CT angio chest a/p.  Consider Breztri 

## 2023-09-24 NOTE — Telephone Encounter (Signed)
 Faxed info to [facility] on 08/25/23

## 2023-09-24 NOTE — Patient Instructions (Signed)
 Please start nebulizer  Ordered CT chest and abdomen.   Let me know how you are doing.

## 2023-09-24 NOTE — Telephone Encounter (Signed)
I have sent urgent message to [facility] asking them to check on this issue

## 2023-09-25 NOTE — Telephone Encounter (Signed)
 Copied from CRM [id number]. Topic: Clinical - Prescription Issue >> Sep 25, 2023  3:29 PM Berneda FALCON wrote: Reason for CRM: Pt states he got off the phone with the medical supply people and they state they have not heard from us  about the nebulizer. He would like an update on this please. Patient callback is [phone]

## 2023-09-26 ENCOUNTER — Encounter: Payer: Self-pay | Admitting: Family

## 2023-09-26 DIAGNOSIS — J449 Chronic obstructive pulmonary disease, unspecified: Secondary | ICD-10-CM | POA: Diagnosis not present

## 2023-09-26 NOTE — Telephone Encounter (Signed)
 Called [facility] and they stated that the pt came in this am and picked up the nebulizer.

## 2023-09-27 DIAGNOSIS — R0902 Hypoxemia: Secondary | ICD-10-CM | POA: Diagnosis not present

## 2023-09-27 DIAGNOSIS — G4730 Sleep apnea, unspecified: Secondary | ICD-10-CM | POA: Diagnosis not present

## 2023-09-29 NOTE — Telephone Encounter (Signed)
 We have now received the ONO results for Benjamin Conley and it has been put in Dr. Jacqulyn folder to review

## 2023-10-03 ENCOUNTER — Encounter: Payer: Self-pay | Admitting: Family

## 2023-10-05 ENCOUNTER — Other Ambulatory Visit: Payer: Self-pay | Admitting: Family

## 2023-10-05 DIAGNOSIS — G8929 Other chronic pain: Secondary | ICD-10-CM

## 2023-10-07 ENCOUNTER — Ambulatory Visit: Payer: Self-pay | Admitting: Pulmonary Disease

## 2023-10-07 ENCOUNTER — Ambulatory Visit

## 2023-10-07 ENCOUNTER — Telehealth: Payer: Self-pay | Admitting: Family

## 2023-10-07 ENCOUNTER — Ambulatory Visit (INDEPENDENT_AMBULATORY_CARE_PROVIDER_SITE_OTHER): Admitting: Family

## 2023-10-07 ENCOUNTER — Encounter: Payer: Self-pay | Admitting: Family

## 2023-10-07 ENCOUNTER — Ambulatory Visit: Admitting: Physician Assistant

## 2023-10-07 VITALS — BP 124/78 | HR 98 | Temp 97.5°F | Ht 64.0 in | Wt 163.0 lb

## 2023-10-07 DIAGNOSIS — G8929 Other chronic pain: Secondary | ICD-10-CM | POA: Diagnosis not present

## 2023-10-07 DIAGNOSIS — M549 Dorsalgia, unspecified: Secondary | ICD-10-CM | POA: Insufficient documentation

## 2023-10-07 DIAGNOSIS — R0789 Other chest pain: Secondary | ICD-10-CM

## 2023-10-07 DIAGNOSIS — M47816 Spondylosis without myelopathy or radiculopathy, lumbar region: Secondary | ICD-10-CM | POA: Diagnosis not present

## 2023-10-07 DIAGNOSIS — M546 Pain in thoracic spine: Secondary | ICD-10-CM

## 2023-10-07 DIAGNOSIS — M48061 Spinal stenosis, lumbar region without neurogenic claudication: Secondary | ICD-10-CM | POA: Diagnosis not present

## 2023-10-07 DIAGNOSIS — M51369 Other intervertebral disc degeneration, lumbar region without mention of lumbar back pain or lower extremity pain: Secondary | ICD-10-CM | POA: Diagnosis not present

## 2023-10-07 DIAGNOSIS — M419 Scoliosis, unspecified: Secondary | ICD-10-CM | POA: Diagnosis not present

## 2023-10-07 DIAGNOSIS — Z4789 Encounter for other orthopedic aftercare: Secondary | ICD-10-CM | POA: Diagnosis not present

## 2023-10-07 NOTE — Assessment & Plan Note (Signed)
 Acute on chronic.Pending Xray thoracic and lumbar today. Discussed scheduling tylenol  arthritis and/or muscle relaxant. He politely declines.  Referral to PT.

## 2023-10-07 NOTE — Telephone Encounter (Signed)
 Spoke to [place] @ Dr Tamea office relayed message below to her in detail, she stated that she was going to reach out to pt to get more info and get him scheduled  Call dr tamea office, pulmonology Pt needs COPD f/u to optimize regimen He is taking breztri  with continued chest tightness.  He also had duoneb now   Please let me know once scheduled

## 2023-10-07 NOTE — Patient Instructions (Signed)
 Referral to physical therapy Call out to Dr Tamea to also schedule a follow up  Let us  know if you dont hear back within a week in regards to an appointment being scheduled.   So that you are aware, if you are [facility] MyChart user , please pay attention to your MyChart messages as you may receive a MyChart message with a phone number to call and schedule this test/appointment own your own from our referral coordinator. This is a new process so I do not want you to miss this message.  If you are not a MyChart user, you will receive a phone call.

## 2023-10-07 NOTE — Telephone Encounter (Signed)
 Call dr tamea office, pulmonology Pt needs COPD f/u to optimize regimen He is taking breztri  with continued chest tightness.  He also had duoneb now  Please let me know once scheduled

## 2023-10-07 NOTE — Progress Notes (Signed)
 Assessment & Plan:  Chronic midline thoracic back pain Assessment & Plan: Acute on chronic.Pending Xray thoracic and lumbar today. Discussed scheduling tylenol  arthritis and/or muscle relaxant. He politely declines.  Referral to PT.  Orders: -     Ambulatory referral to Physical Therapy  Chronic thoracic back pain, unspecified back pain laterality Assessment & Plan: Acute on chronic.Pending Xray thoracic and lumbar today. Discussed scheduling tylenol  arthritis and/or muscle relaxant. He politely declines.  Referral to PT.  Orders: -     DG Lumbar Spine Complete; Future -     DG Thoracic Spine W/Swimmers; Future  Chest tightness Assessment & Plan: No acute respiratory distress. He is not labored in his speech. Some improvement with duoneb ( using q6hrs) and Breztri  with albuterol  however he continues to experience SOB when outdoors. We discussed the nature of heat at this time and current advisory.  I would have anticipated greater symptom improvement with regimen. Advised patient that he would need follow up with Dr Tamea as he may need updated PFTs and to optimize/simplify his regimen.       Return precautions given.   Risks, benefits, and alternatives of the medications and treatment plan prescribed today were discussed, and patient expressed understanding.   Education regarding symptom management and diagnosis given to patient on AVS either electronically or printed.  No follow-ups on file.  Rollene Northern, [name]  Subjective:    Patient ID: Benjamin Conley, male    DOB: [birth year], 58 y.o.   MRN: [id number]  CC: Benjamin Conley is a 58 y.o. male who presents today for an acute visit.    HPI: Complains of middle back pain and in between his shoulder blades for months, waxes and wanes.   Can be a punch feeling. Aggravated by leaning and twisting.   He uses a cane.  Denies  numbness from low back into legs. Denies groin pain.   No falls.   He doesn't take  pain medication for this.       H/o lumbar surgery and hardware.   Compliant with duoneb every 6 hours and chest tightness has improved.   Denies cough, wheezing , increased sputum, leg swelling  He is compliant with breztri  and albuterol  x 2 weeks prior to going outside. He went outside 3 days ago on a hot day and he felt he 'couldnt breath in or breath out'. Symptoms resolved once inside in a couple of minutes.   GMI is 7.5% per [facility] today. Current blood glucose 149 ( he hasn't eating breakfast)  Dr Tamea 10/28/2022  for COPD  No inhalers at this time.   Seen by Dr Theotis 11/30/2015  PFTs normal.    Echocardiogram 09/28/19 with LVEF 55-60% trial MVR.   Of note, diarrhea has completely resolved from the weekend. Denies abdominal pain, fever, N, vomiting.  Allergies: Wasp venom, Wasp venom protein, Coffee flavoring agent (non-screening), Onion, and Prednisone  Current Outpatient Medications on File Prior to Visit  Medication Sig Dispense Refill   albuterol  (VENTOLIN  HFA) 108 (90 Base) MCG/ACT inhaler Inhale 2 puffs into the lungs every 6 (six) hours as needed for wheezing or shortness of breath. 8 g 2   aspirin  81 MG EC tablet Take 1 tablet (81 mg total) by mouth daily. 90 tablet 3   atorvastatin  (LIPITOR) 40 MG tablet Take 1 tablet (40 mg total) by mouth daily. (Patient taking differently: Take 20 mg by mouth daily.) 90 tablet 3   blood glucose meter kit and supplies Dispense  based on patient and insurance preference. Use up to four times daily as directed. (FOR ICD-10 E10.9, E11.9). 1 each 0   budesonide -glycopyrrolate -formoterol  (BREZTRI ) 160-9-4.8 MCG/ACT AERO inhaler Inhale 2 puffs into the lungs 2 (two) times daily. 10.7 g 1   clopidogrel  (PLAVIX ) 75 MG tablet Take 1 tablet by mouth once daily 90 tablet 1   Continuous Glucose Sensor (FREESTYLE LIBRE 3 SENSOR) MISC USE 1 SENSOR EVERY 14 DAYS TO CHECK GLUCOSE CONTINUOUSLY 4 each 0   cyanocobalamin  (VITAMIN B12) 1000 MCG  tablet Take 1,000 mcg by mouth daily.     digoxin  (LANOXIN ) 0.25 MG tablet Take 1 tablet (0.25 mg total) by mouth daily. 30 tablet 3   empagliflozin  (JARDIANCE ) 25 MG TABS tablet Take 1 tablet (25 mg total) by mouth daily before breakfast. 90 tablet 3   famotidine  (PEPCID ) 20 MG tablet Take 1 tablet (20 mg total) by mouth at bedtime. 90 tablet 1   glimepiride  (AMARYL ) 1 MG tablet Take 1 tablet (1 mg total) by mouth daily with breakfast. 10 tablet 0   ipratropium-albuterol  (DUONEB) 0.5-2.5 (3) MG/3ML SOLN Take 3 mLs by nebulization every 6 (six) hours. 360 mL 1   midodrine  (PROAMATINE ) 10 MG tablet Take 10 mg by mouth in the morning.     Semaglutide , 2 MG/DOSE, (OZEMPIC , 2 MG/DOSE,) 8 MG/3ML SOPN INJECT 2 MG SUBCUTANEOUSLY ONCE A WEEK 9 mL 0   silodosin  (RAPAFLO ) 8 MG CAPS capsule TAKE 1 CAPSULE BY MOUTH ONCE DAILY WITH BREAKFAST 30 capsule 0   triamcinolone  cream (KENALOG ) 0.1 % Apply 1 application topically 2 (two) times daily. 30 g 0   No current facility-administered medications on file prior to visit.    Review of Systems  Constitutional:  Negative for chills and fever.  Respiratory:  Positive for chest tightness and shortness of breath. Negative for cough and wheezing.   Cardiovascular:  Negative for chest pain, palpitations and leg swelling.  Gastrointestinal:  Negative for nausea and vomiting.  Musculoskeletal:  Positive for back pain.      Objective:    BP 124/78   Pulse 98   Temp (!) 97.5 F (36.4 C) (Oral)   Ht 5' 4 (1.626 m)   Wt 163 lb (73.9 kg)   SpO2 99%   BMI 27.98 kg/m   BP Readings from Last 3 Encounters:  10/07/23 124/78  09/23/23 130/76  09/19/23 124/63   Wt Readings from Last 3 Encounters:  10/07/23 163 lb (73.9 kg)  09/23/23 166 lb 6.4 oz (75.5 kg)  09/19/23 168 lb (76.2 kg)    Physical Exam Vitals reviewed.  Constitutional:      Appearance: He is well-developed.  Cardiovascular:     Rate and Rhythm: Regular rhythm.     Heart sounds: Normal  heart sounds.  Pulmonary:     Effort: Pulmonary effort is normal. No respiratory distress.     Breath sounds: Normal breath sounds. No wheezing, rhonchi or rales.  Musculoskeletal:       Arms:     Thoracic back: Spasms and tenderness present. No swelling or bony tenderness. Normal range of motion. No scoliosis.  Skin:    General: Skin is warm and dry.  Neurological:     Mental Status: He is alert.  Psychiatric:        Speech: Speech normal.        Behavior: Behavior normal.

## 2023-10-07 NOTE — Assessment & Plan Note (Addendum)
 No acute respiratory distress. He is not labored in his speech. Some improvement with duoneb ( using q6hrs) and Breztri  with albuterol  however he continues to experience SOB when outdoors. We discussed the nature of heat at this time and current advisory.  I would have anticipated greater symptom improvement with regimen. Advised patient that he would need follow up with Dr Tamea as he may need updated PFTs and to optimize/simplify his regimen.

## 2023-10-07 NOTE — Telephone Encounter (Signed)
 FYI Only or Action Required?: Action required by provider: refusing disposition, requesting appt, needs call back with further recommendations.  Patient is followed in Pulmonology for COPD and emphysema, last seen on 09/03/2023 by Isaiah Scrivener, MD.  Called Nurse Triage reporting Shortness of Breath, Chest Pain, and Dizziness.  Symptoms began about a month ago.  Interventions attempted: Rescue inhaler, Maintenance inhaler, Nebulizer treatments, Increased fluids/rest, and Other: recent hospital admission 7/10-7/11, recent imaging, and PCP appt today.  Symptoms are: gradually worsening.  Triage Disposition: See HCP Within 4 Hours (Or PCP Triage) - Advised pt be examined asap for symptoms, pt just examined by PCP today, pt confirms no SOB more than usual or symptoms at this time, pt speaking in full sentences. Advised pt go to ED if any worsening, call 911 if struggling to breathe again. Advised pt avoid outdoors since trigger for his symptoms.  Patient/caregiver understands and will follow disposition?: No, refuses disposition    Copied from CRM [id number]. Topic: Clinical - Red Word Triage >> Oct 07, 2023 11:39 AM Leila C wrote: Red Word that prompted transfer to Nurse Triage: Heather from NP, Rollene Arnette's office [phone] ext [phone] patient is still having continue chest tighteness, shortness of breathe sometimes comes and goes. Jente is trying to schedule an appointment with following up appointment Dr. Tamea, patient was in the office earlier but has left the office. Please advise.   ----------------------------------------------------------------------- From previous Reason for Contact - Scheduling: Patient/patient representative is calling to schedule an appointment. Refer to attachments for appointment information.  [place] from [place] Arnette's office [employer]  Reason for Disposition  [1] Longstanding difficulty breathing (e.g., CHF, COPD,  emphysema) AND [2] WORSE than normal  Answer Assessment - Initial Assessment Questions 1. REASON FOR CALL: What is the main reason for your call? or How can I best help you?     Jeanetta [place] from PCP's office calling to set up follow up appt with pulm for pt, pt no longer at PCP office but pt was seen today with PCP for chest tightness and SOB 2. SYMPTOMS : Do you have any symptoms?      Taking breztri  and duoneb and still chest tightness, comes and goes SOB comes and goes He doesn't know how to take it easy [place] majority of time try do simple stuff like walk the dog or mow the grass in the heat, even if in house, comes and goes, worse with activity   Advised that this nurse will call pt to triage further, no further questions or concerns.  Answer Assessment - Initial Assessment Questions First attempt to contact pt, pt answered, mutual greeting, pt phone disconnected when nurse asked for first and last name and DOB. Called again in case disconnected. Pt stated phone disconnected, continued with triage.  [facility] Triage - Initial Assessment Questions Chief Complaint (e.g., cough, sob, wheezing, fever, chills, sweat or additional symptoms) *Go to specific symptom protocol after initial questions. When get in heat, gets real hard to breathe, did CXR and everything EKG and all that, coming back normal Admitted to hospital for lung issues, had me on nebulizer, cleared it up [facility] into ER on 7/10 gave steroid for lungs and sent home, discharged 7/11 without even watching my sugar levels, went back and got admitted for the sugar being too high, observation Chest tightness usual with his SOB, especially when get in the heat As long as inside out of heat ain't that bad When get outside feels like about  to die, can't breathe at all, especially after walking for few minutes, struggling for each breath/word at least Sunday No nausea or excessive sweating Dizziness is pretty normal for  me, have orthostatic hypotension, yeah basically worse lately Cough usually not, couple times coughed on phone with you No fever or chills, get cold easy because on blood thinners No wheezing Day went in for chest pain and back pain, no breathing problems, had chest pain, dizziness, and headache, then trying to listen to heart but couldn't hear over wheezing in lungs, but pt didn't realize he was wheezing, but cleared up after breathing treatment   How long have symptoms been present? The last month  Have you used your inhalers/maintenance medication? Yes If yes, What medications? Nebulizer duoneb Breztri  Albuterol  inhaler rescue  If inhaler, ask How many puffs and how often? Note: Review instructions on medication in the chart. Using nebulizer every 6 hours, okay until go outside in the heat seems like kicks back in Taking inhalers before go out and after go back in, still not helping outside Albuterol  inhaler used 8x on Sunday, use before and after go outside to take care of dogs  OXYGEN: Do you wear supplemental oxygen? No  Do you monitor your oxygen levels? Yes If yes, What is your reading (oxygen level) today? 98% at doc office, keeps coming up normal  What is your usual oxygen saturation reading?  (Note: Pulmonary O2 sats should be 90% or greater) 95-98%  6. CARDIAC HISTORY: Do you have any history of heart disease? (e.g., heart attack, angina, bypass surgery, angioplasty)      PAD, blood clot in spleen, no blood clots in legs or lungs  7. LUNG HISTORY: Do you have any history of lung disease?  (e.g., pulmonary embolus, asthma, emphysema)     COPD and emphysema   Advised pt be examined asap for symptoms, pt just examined by PCP today, pt confirms no SOB more than usual or symptoms at this time, pt speaking in full sentences. Offered pt appt in next 3 hours at [facility] office but pt refusing due to office being too far. Advised pt go to ED if any  worsening, call 911 if struggling to breathe again. Advised pt avoid outdoors since trigger for his symptoms. Pt states he will drive himself to ED if feels like he did on Sunday with SOB, advised pt have another adult drive him but if insists on driving himself then pull over and call 911 if any worsening. Sending message to pulm for call back to pt with further recommendations/appt options.  Protocols used: Information Only Call - No Triage-A-AH, Breathing Difficulty-A-AH

## 2023-10-07 NOTE — Telephone Encounter (Signed)
 Appt scheduled for 10/10/2023 with Izetta Rouleau, NP.  Nothing further needed.

## 2023-10-08 ENCOUNTER — Ambulatory Visit: Admitting: Family

## 2023-10-08 NOTE — Telephone Encounter (Signed)
 noted

## 2023-10-10 ENCOUNTER — Ambulatory Visit: Admitting: Nurse Practitioner

## 2023-10-10 ENCOUNTER — Encounter: Payer: Self-pay | Admitting: Nurse Practitioner

## 2023-10-10 VITALS — BP 120/80 | Temp 97.2°F | Ht 64.0 in | Wt 163.4 lb

## 2023-10-10 DIAGNOSIS — R0609 Other forms of dyspnea: Secondary | ICD-10-CM

## 2023-10-10 DIAGNOSIS — Z87891 Personal history of nicotine dependence: Secondary | ICD-10-CM

## 2023-10-10 DIAGNOSIS — J4489 Other specified chronic obstructive pulmonary disease: Secondary | ICD-10-CM

## 2023-10-10 DIAGNOSIS — J449 Chronic obstructive pulmonary disease, unspecified: Secondary | ICD-10-CM

## 2023-10-10 MED ORDER — OHTUVAYRE 3 MG/2.5ML IN SUSP: 2.5000 mL | Freq: Two times a day (BID) | RESPIRATORY_TRACT | Status: DC

## 2023-10-10 NOTE — Progress Notes (Signed)
 @Patient  ID: Benjamin Conley, male    DOB: [birth year], 58 y.o.   MRN: [id number]  Chief Complaint  Patient presents with   Hospitalization Follow-up   COPD    Sob when walking his dogs outside.    Referring provider: Dineen Rollene MATSU, [name]  HPI: 58 year old male, former smoker followed for COPD. He is a patient of Dr. Tamea and last seen in office 09/03/2023 by Dr. Isaiah for acute visit. Past medical history significant for PAD, HTN, GERD, DM, BPH, HLD, sinus tachycardia on digoxin .   TEST/EVENTS:  09/13/2021 PFT: FVC 91%, FEV1 89%, ratio 75%. No BD. Mildly reduced DLCO 04/2022 LDCT chest: Lung RADS 2  09/03/2023: OV with Dr. Isaiah. Whenever he walks around outside, will get out of breath and begin to have chest discomfort. Improves with rest. Does not have similar symptoms when exerting inside. Following with heart doctor.   10/10/2023: Today - hospital follow up Discussed the use of AI scribe software for clinical note transcription with the patient, who gave verbal consent to proceed.  History of Present Illness Benjamin Conley is a 58 year old male with COPD who presents with breathing difficulties.  He has a history of COPD and recently experienced breathing difficulties, leading to two hospital visits. Initially on 7/10, he was treated with prednisone , which caused a significant rise in blood sugar, necessitating a second ED visit 7/11 for DKA. During the first visit, he experienced chest and back pain, and there was difficulty hearing his heart due to lung wheezing. A CT scan was performed during his hospital visit with clear lung fields. He was treated with steroids. No abx were given.  His breathing has somewhat returned to normal, but he experiences significant difficulty when exposed to heat, describing a sensation of 'suffocating' and difficulty speaking. He uses nebulizers every six hours and the Breztri  inhaler more frequently than prescribed, particularly before and  after going outside, to prevent episodes of breathing difficulty. He also is using his albuterol  rescue inhaler multiple times a day.   No significant cough, though he occasionally coughs when inhaling deeply, without expectoration. He has not noticed any wheezing recently. He reports swelling in his big toe but not in his legs. He's being followed by wound management/podiatry.   He had a cardiac stress test in June that was low risk. EF was normal at 69%.    Allergies  Allergen Reactions   Wasp Venom Anaphylaxis   Wasp Venom Protein Anaphylaxis   Coffee Flavoring Agent (Non-Screening) Nausea And Vomiting    Patient states coffee gives him nausea and stomach cramps   Onion Nausea And Vomiting   Prednisone  Other (See Comments)    DKA after IV solu medrol  following by oral prednionse    Immunization History  Administered Date(s) Administered   Td 03/11/2004   Tdap 08/02/2014    Past Medical History:  Diagnosis Date   Acute posthemorrhagic anemia    Allergy 2015   Arthritis    Asthma 2008   Carotid artery stenosis    bilateral   Complication of anesthesia    c/o difficulty breathing after anesthesia   COPD (chronic obstructive pulmonary disease) (HCC)    Coronary artery disease    Diabetes mellitus without complication (HCC)    GERD (gastroesophageal reflux disease)    Hyperlipemia    Hypertension    Melena 08/21/2019   Paroxysmal supraventricular tachycardia (HCC) 08/19/2019   Postural dizziness with presyncope 08/21/2019   Renal disorder  early stage CKD   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   Tobacco abuse    Ulcer    Upper GI bleed 08/21/2019    Tobacco History: Social History   Tobacco Use  Smoking Status Former   Current packs/day: 0.00   Average packs/day: 2.0 packs/day for 48.0 years (96.0 ttl pk-yrs)   Types: Cigarettes   Start date: 02/12/1972   Quit date: 02/12/2020   Years since quitting: 3.6  Smokeless Tobacco Never   Counseling given: Not  Answered   Outpatient Medications Prior to Visit  Medication Sig Dispense Refill   albuterol  (VENTOLIN  HFA) 108 (90 Base) MCG/ACT inhaler Inhale 2 puffs into the lungs every 6 (six) hours as needed for wheezing or shortness of breath. 8 g 2   aspirin  81 MG EC tablet Take 1 tablet (81 mg total) by mouth daily. 90 tablet 3   atorvastatin  (LIPITOR) 40 MG tablet Take 1 tablet (40 mg total) by mouth daily. (Patient taking differently: Take 20 mg by mouth daily.) 90 tablet 3   blood glucose meter kit and supplies Dispense based on patient and insurance preference. Use up to four times daily as directed. (FOR ICD-10 E10.9, E11.9). 1 each 0   budesonide -glycopyrrolate -formoterol  (BREZTRI ) 160-9-4.8 MCG/ACT AERO inhaler Inhale 2 puffs into the lungs 2 (two) times daily. 10.7 g 1   clopidogrel  (PLAVIX ) 75 MG tablet Take 1 tablet by mouth once daily 90 tablet 1   Continuous Glucose Sensor (FREESTYLE LIBRE 3 SENSOR) MISC USE 1 SENSOR EVERY 14 DAYS TO CHECK GLUCOSE CONTINUOUSLY 4 each 0   cyanocobalamin  (VITAMIN B12) 1000 MCG tablet Take 1,000 mcg by mouth daily.     digoxin  (LANOXIN ) 0.25 MG tablet Take 1 tablet (0.25 mg total) by mouth daily. 30 tablet 3   empagliflozin  (JARDIANCE ) 25 MG TABS tablet Take 1 tablet (25 mg total) by mouth daily before breakfast. 90 tablet 3   famotidine  (PEPCID ) 20 MG tablet Take 1 tablet (20 mg total) by mouth at bedtime. 90 tablet 1   glimepiride  (AMARYL ) 1 MG tablet Take 1 tablet (1 mg total) by mouth daily with breakfast. 10 tablet 0   ipratropium-albuterol  (DUONEB) 0.5-2.5 (3) MG/3ML SOLN Take 3 mLs by nebulization every 6 (six) hours. 360 mL 1   midodrine  (PROAMATINE ) 10 MG tablet Take 10 mg by mouth in the morning.     Semaglutide , 2 MG/DOSE, (OZEMPIC , 2 MG/DOSE,) 8 MG/3ML SOPN INJECT 2 MG SUBCUTANEOUSLY ONCE A WEEK 9 mL 0   silodosin  (RAPAFLO ) 8 MG CAPS capsule TAKE 1 CAPSULE BY MOUTH ONCE DAILY WITH BREAKFAST 30 capsule 0   triamcinolone  cream (KENALOG ) 0.1 % Apply 1  application topically 2 (two) times daily. 30 g 0   No facility-administered medications prior to visit.     Review of Systems:   Constitutional: No weight loss or gain, night sweats, fevers, chills, fatigue, or lassitude. HEENT: No headaches, difficulty swallowing, tooth/dental problems, or sore throat. No sneezing, itching, ear ache, nasal congestion, or post nasal drip CV:  +exertional chest pain. No orthopnea, PND, swelling in lower extremities, anasarca, dizziness, palpitations, syncope Resp: + shortness of breath with exertion; rare cough. No excess mucus or change in color of mucus.  No hemoptysis. No wheezing.  No chest wall deformity GI:  No heartburn, indigestion, abdominal pain, nausea, vomiting, diarrhea, change in bowel habits, loss of appetite, bloody stools.  Neuro: No dizziness or lightheadedness.  Psych: No depression or anxiety. Mood stable.     Physical Exam:  BP 120/80  Temp (!) 97.2 F (36.2 C) (Oral)   Ht 5' 4 (1.626 m)   Wt 163 lb 6.4 oz (74.1 kg)   BMI 28.05 kg/m   GEN: Pleasant, interactive, well-kempt; in no acute distress HEENT:  Normocephalic and atraumatic. PERRLA. Sclera white. Nasal turbinates pink, moist and patent bilaterally. No rhinorrhea present. Oropharynx pink and moist, without exudate or edema. No lesions, ulcerations, or postnasal drip.  NECK:  Supple w/ fair ROM. No JVD present. Thyroid  symmetrical with no goiter or nodules palpated. No lymphadenopathy.   CV: RRR, no m/r/g, no peripheral edema. Pulses intact, +2 bilaterally. No cyanosis, pallor or clubbing. PULMONARY:  Unlabored, regular breathing. Clear bilaterally A&P w/o wheezes/rales/rhonchi.  GI: BS present and normoactive. Soft, non-tender to palpation. No organomegaly or masses detected.  MSK: No erythema, warmth or tenderness. Cap refil <2 sec all extrem.  Neuro: A/Ox3. No focal deficits noted.   Skin: Warm, no lesions or rashe Psych: Normal affect and behavior. Judgement and  thought content appropriate.     Lab Results:  CBC    Component Value Date/Time   WBC 8.3 09/19/2023 0138   RBC 4.61 09/19/2023 0138   HGB 13.3 09/19/2023 0138   HGB 11.8 (L) 03/16/2020 1540   HCT 42.0 09/19/2023 0138   HCT 35.9 (L) 03/16/2020 1540   PLT 406 (H) 09/19/2023 0138   PLT 540 (H) 03/16/2020 1540   MCV 91.1 09/19/2023 0138   MCV 89 03/16/2020 1540   MCV 90 08/30/2013 2322   MCH 28.9 09/19/2023 0138   MCHC 31.7 09/19/2023 0138   RDW 14.4 09/19/2023 0138   RDW 13.4 03/16/2020 1540   RDW 13.5 08/30/2013 2322   LYMPHSABS 3.7 06/14/2023 2017   LYMPHSABS 3.6 (H) 03/16/2020 1540   MONOABS 0.7 06/14/2023 2017   EOSABS 0.1 06/14/2023 2017   EOSABS 0.1 03/16/2020 1540   BASOSABS 0.1 06/14/2023 2017   BASOSABS 0.1 03/16/2020 1540    BMET    Component Value Date/Time   NA 140 09/23/2023 1313   NA 137 02/23/2021 1531   NA 137 08/30/2013 2322   K 3.9 09/23/2023 1313   K 3.5 08/30/2013 2322   CL 102 09/23/2023 1313   CL 101 08/30/2013 2322   CO2 31 09/23/2023 1313   CO2 23 08/30/2013 2322   GLUCOSE 141 (H) 09/23/2023 1313   GLUCOSE 374 (H) 08/30/2013 2322   BUN 15 09/23/2023 1313   BUN 13 02/23/2021 1531   BUN 13 08/30/2013 2322   CREATININE 0.66 09/23/2023 1313   CREATININE 0.66 (L) 01/21/2020 1505   CALCIUM  10.2 09/23/2023 1313   CALCIUM  9.1 08/30/2013 2322   GFRNONAA >60 09/19/2023 0833   GFRNONAA >60 08/30/2013 2322   GFRAA >60 09/23/2019 0100   GFRAA >60 08/30/2013 2322    BNP    Component Value Date/Time   BNP 35.9 09/23/2019 0100     Imaging:  CT ANGIO CHEST/ABD/PEL FOR DISSECTION W &/OR WO CONTRAST Result Date: 09/23/2023 CLINICAL DATA:  Chest tightness EXAM: CT ANGIOGRAPHY CHEST, ABDOMEN AND PELVIS TECHNIQUE: Non-contrast CT of the chest was initially obtained. Multidetector CT imaging through the chest, abdomen and pelvis was performed using the standard protocol during bolus administration of intravenous contrast. Multiplanar reconstructed  images and MIPs were obtained and reviewed to evaluate the vascular anatomy. RADIATION DOSE REDUCTION: This exam was performed according to the departmental dose-optimization program which includes automated exposure control, adjustment of the mA and/or kV according to patient size and/or use of iterative reconstruction technique. CONTRAST:  OMNIPAQUE  IOHEXOL  350 MG/ML SOLN COMPARISON:  None Available. FINDINGS: CTA CHEST FINDINGS Cardiovascular: Heart is normal size. Aorta is normal caliber. No dissection. No filling defects in the pulmonary arteries to suggest pulmonary emboli. Scattered coronary artery calcifications in the left anterior descending and right coronary arteries. Mediastinum/Nodes: No mediastinal, hilar, or axillary adenopathy. Trachea and esophagus are unremarkable. Thyroid  unremarkable. Lungs/Pleura: Lungs are clear. No focal airspace opacities or suspicious nodules. No effusions. Calcified granuloma noted in the left lower lobe, unchanged. Musculoskeletal: Chest wall soft tissues are unremarkable. No acute bony abnormality. Review of the MIP images confirms the above findings. CTA ABDOMEN AND PELVIS FINDINGS VASCULAR Aorta: Normal caliber aorta without aneurysm, dissection, vasculitis or significant stenosis. Celiac: Patent without evidence of aneurysm, dissection, vasculitis or significant stenosis. SMA: Patent without evidence of aneurysm, dissection, vasculitis or significant stenosis. Renals: Both renal arteries are patent without evidence of aneurysm, dissection, vasculitis, fibromuscular dysplasia or significant stenosis. IMA: Patent without evidence of aneurysm, dissection, vasculitis or significant stenosis. Inflow: Scattered calcifications. There is a focal short segment dissection noted in the left common iliac artery. This is age indeterminate but new since 2021 study. No aneurysm. Veins: No obvious venous abnormality within the limitations of this arterial phase study. Review of  the MIP images confirms the above findings. NON-VASCULAR Hepatobiliary: No focal hepatic abnormality. Gallbladder unremarkable. Pancreas: No focal abnormality or ductal dilatation. Spleen: Calcifications throughout the spleen compatible with old granulomatous disease. Normal size. Adrenals/Urinary Tract: No adrenal abnormality. No focal renal abnormality. No stones or hydronephrosis. Urinary bladder is unremarkable. Stomach/Bowel: Stomach, large and small bowel grossly unremarkable. Lymphatic: No adenopathy Reproductive: Mildly enlarged prostate. Other: No free fluid or free air. Musculoskeletal: No acute bony abnormality. Review of the MIP images confirms the above findings. IMPRESSION: Focal short segment dissection noted in the left common iliac artery. This is age indeterminate but new since 2021. No aneurysm or dissection involving the aorta. No pulmonary embolus. Scattered coronary artery calcifications in the left anterior descending and right coronary arteries. Electronically Signed   By: Franky Crease M.D.   On: 09/23/2023 15:45   DG Chest 2 View Result Date: 09/18/2023 CLINICAL DATA:  Chest pain EXAM: CHEST - 2 VIEW COMPARISON:  August 30, 2023 chest radiograph FINDINGS: The heart size and mediastinal contours are within normal limits. Both lungs are clear. The visualized skeletal structures are unremarkable. IMPRESSION: No active cardiopulmonary disease. Electronically Signed   By: Michaeline Blanch M.D.   On: 09/18/2023 14:05    Administration History     None          Latest Ref Rng & Units 09/13/2021   10:44 AM  PFT Results  FVC-Pre L 3.62   FVC-Predicted Pre % 91   FVC-Post L 3.52   FVC-Predicted Post % 88   Pre FEV1/FVC % % 75   Post FEV1/FCV % % 68   FEV1-Pre L 2.71   FEV1-Predicted Pre % 89   FEV1-Post L 2.38   DLCO uncorrected ml/min/mmHg 17.19   DLCO UNC% % 73   DLVA Predicted % 84   TLC L 5.11   TLC % Predicted % 88   RV % Predicted % 76     No results found for:  NITRICOXIDE      Assessment & Plan:   DOE (dyspnea on exertion) Unclear if DOE is pulmonary in nature. He has mild COPD based on prior lung testing. CT imaging has been unremarkable. Lung exam is clear today. His symptoms are exacerbated  by heat/humidity, which we discussed is a common trigger for COPD. Walk test today without desaturation on room air. Recent EF nl. Will order repeat PFT for further evaluation.   Patient Instructions  Continue Breztri  2 puffs Twice daily. Brush tongue and rinse mouth afterwards. Only use your Breztri  twice daily Continue Albuterol  inhaler 2 puffs or 3 mL neb every 6 hours as needed for shortness of breath or wheezing. Notify if symptoms persist despite rescue inhaler/neb use. Try to decrease use of your albuterol  as much as possible.  Start Ohtuvayre  2.5 mL neb Twice daily. You will use this twice daily, regardless of how you feel. This is not a rescue medicine   You had a stress test recently with normal pumping function of the heart   We will repeat your lung function testing.   Follow up in 6 weeks after PFT with Dr. Tamea or Izetta Masao Junker,NP. If symptoms do not improve or worsen, please contact office for sooner follow up or seek emergency care.    COPD with chronic bronchitis (HCC) Mild obstruction. See above. He is overusing his maintenance and PRN therapies. Re-educated today. Will add on nebulized Ohtuvayre  and reassess response at follow up. Enrollment paperwork completed today. Pt educated on side effect profile and proper administration. Trigger prevention reviewed. Action plan in place.    Advised if symptoms do not improve or worsen, to please contact office for sooner follow up or seek emergency care.   I spent 45 minutes of dedicated to the care of this patient on the date of this encounter to include pre-visit review of records, face-to-face time with the patient discussing conditions above, post visit ordering of testing, clinical  documentation with the electronic health record, making appropriate referrals as documented, and communicating necessary findings to members of the patients care team.  Comer LULLA Rouleau, NP 10/10/2023  Pt aware and understands NP's role.

## 2023-10-10 NOTE — Patient Instructions (Addendum)
 Continue Breztri  2 puffs Twice daily. Brush tongue and rinse mouth afterwards. Only use your Breztri  twice daily Continue Albuterol  inhaler 2 puffs or 3 mL neb every 6 hours as needed for shortness of breath or wheezing. Notify if symptoms persist despite rescue inhaler/neb use. Try to decrease use of your albuterol  as much as possible.  Start Ohtuvayre  2.5 mL neb Twice daily. You will use this twice daily, regardless of how you feel. This is not a rescue medicine   You had a stress test recently with normal pumping function of the heart   We will repeat your lung function testing.   Follow up in 6 weeks after PFT with Dr. Tamea or Benjamin Benjamin Fenstermacher,NP. If symptoms do not improve or worsen, please contact office for sooner follow up or seek emergency care.

## 2023-10-10 NOTE — Assessment & Plan Note (Signed)
 Mild obstruction. See above. He is overusing his maintenance and PRN therapies. Re-educated today. Will add on nebulized Ohtuvayre  and reassess response at follow up. Enrollment paperwork completed today. Pt educated on side effect profile and proper administration. Trigger prevention reviewed. Action plan in place.

## 2023-10-10 NOTE — Assessment & Plan Note (Signed)
 Unclear if DOE is pulmonary in nature. He has mild COPD based on prior lung testing. CT imaging has been unremarkable. Lung exam is clear today. His symptoms are exacerbated by heat/humidity, which we discussed is a common trigger for COPD. Walk test today without desaturation on room air. Recent EF nl. Will order repeat PFT for further evaluation.   Patient Instructions  Continue Breztri  2 puffs Twice daily. Brush tongue and rinse mouth afterwards. Only use your Breztri  twice daily Continue Albuterol  inhaler 2 puffs or 3 mL neb every 6 hours as needed for shortness of breath or wheezing. Notify if symptoms persist despite rescue inhaler/neb use. Try to decrease use of your albuterol  as much as possible.  Start Ohtuvayre  2.5 mL neb Twice daily. You will use this twice daily, regardless of how you feel. This is not a rescue medicine   You had a stress test recently with normal pumping function of the heart   We will repeat your lung function testing.   Follow up in 6 weeks after PFT with Dr. Tamea or Izetta Emmarose Klinke,NP. If symptoms do not improve or worsen, please contact office for sooner follow up or seek emergency care.

## 2023-10-13 ENCOUNTER — Ambulatory Visit: Payer: Self-pay | Admitting: Family

## 2023-10-15 ENCOUNTER — Telehealth: Payer: Self-pay

## 2023-10-15 ENCOUNTER — Encounter: Payer: Self-pay | Admitting: Family

## 2023-10-15 DIAGNOSIS — J449 Chronic obstructive pulmonary disease, unspecified: Secondary | ICD-10-CM

## 2023-10-15 MED ORDER — FREESTYLE LIBRE 3 PLUS SENSOR MISC: 3 refills | Status: DC

## 2023-10-15 NOTE — Telephone Encounter (Signed)
 Received Ohtuvayre new start paperwork. Completed form and faxed with clinicals and insurance card copy to [facility]   Phone#: [phone] Fax#: [phone]

## 2023-10-15 NOTE — Telephone Encounter (Signed)
 Pt requesting libre 3 plus

## 2023-10-17 ENCOUNTER — Encounter: Payer: Self-pay | Admitting: Family

## 2023-10-17 NOTE — Telephone Encounter (Signed)
 Spoke to pt he states that he does not need to be seen I encouraged him to take some Tylenol  and just keep check on it, also if he needs to be seen got [facility] or [facility] walk in

## 2023-10-20 ENCOUNTER — Encounter: Payer: Self-pay | Admitting: Internal Medicine

## 2023-10-21 LAB — MICROALBUMIN / CREATININE URINE RATIO: Microalb Creat Ratio: <30

## 2023-10-22 DIAGNOSIS — M25512 Pain in left shoulder: Secondary | ICD-10-CM | POA: Diagnosis not present

## 2023-10-22 DIAGNOSIS — M5416 Radiculopathy, lumbar region: Secondary | ICD-10-CM | POA: Diagnosis not present

## 2023-10-22 NOTE — Telephone Encounter (Signed)
 Received fax from [employer] with summary of benefits. Referral form for Ohtuvayre  received. Rx will be triaged to [facility].. Once benefits investigation completed, pharmacy will reach out the patient to schedule shipment. If medication is unaffordable, patient will need to express financial hardship to be referred back to [place] [facility] for patient assistance program pre-screening.   Patient ID: [id number] Pharmacy phone: [phone] [place] [facility] Phone#: [phone]

## 2023-10-24 ENCOUNTER — Ambulatory Visit: Admitting: Nurse Practitioner

## 2023-10-27 ENCOUNTER — Encounter: Payer: Self-pay | Admitting: Family

## 2023-10-27 DIAGNOSIS — E119 Type 2 diabetes mellitus without complications: Secondary | ICD-10-CM

## 2023-10-30 ENCOUNTER — Encounter: Payer: Self-pay | Admitting: Cardiology

## 2023-10-30 ENCOUNTER — Encounter (HOSPITAL_BASED_OUTPATIENT_CLINIC_OR_DEPARTMENT_OTHER): Payer: Self-pay

## 2023-10-30 ENCOUNTER — Ambulatory Visit: Attending: Cardiology | Admitting: Cardiology

## 2023-10-30 VITALS — BP 110/68 | HR 87 | Ht 65.0 in | Wt 163.0 lb

## 2023-10-30 DIAGNOSIS — Z79899 Other long term (current) drug therapy: Secondary | ICD-10-CM | POA: Diagnosis not present

## 2023-10-30 DIAGNOSIS — I4711 Inappropriate sinus tachycardia, so stated: Secondary | ICD-10-CM | POA: Diagnosis not present

## 2023-10-30 DIAGNOSIS — R079 Chest pain, unspecified: Secondary | ICD-10-CM

## 2023-10-30 DIAGNOSIS — E782 Mixed hyperlipidemia: Secondary | ICD-10-CM

## 2023-10-30 NOTE — Patient Instructions (Signed)
 Medication Instructions:  Your physician recommends that you continue on your current medications as directed. Please refer to the Current Medication list given to you today.   *If you need a refill on your cardiac medications before your next appointment, please call your pharmacy*  Lab Work: Your provider would like for you to return in 2 weeks to have the following labs drawn: DIgoxin .   Please go to [facility] [address] ([facility] [address], [place] [address] You do not need an appointment.  They are open from 8 am- 4:30 pm.  Lunch from 1:00 pm- 2:00 pm You do not need to be fasting.  If you have labs (blood work) drawn today and your tests are completely normal, you will receive your results only by: MyChart Message (if you have MyChart) OR A paper copy in the mail If you have any lab test that is abnormal or we need to change your treatment, we will call you to review the results.  Testing/Procedures: No test ordered today   Follow-Up: At [facility], you and your health needs are our priority.  As part of our continuing mission to provide you with exceptional heart care, our providers are all part of one team.  This team includes your primary Cardiologist (physician) and Advanced Practice Providers or APPs (Physician Assistants and Nurse Practitioners) who all work together to provide you with the care you need, when you need it.  Your next appointment:   1 year(s)  Provider:   You may see Redell Cave, MD or one of the following Advanced Practice Providers on your designated Care Team:   Lonni Meager, NP Lesley Maffucci, PA-C Bernardino Bring, PA-C Cadence Woodstock, PA-C Tylene Lunch, NP Barnie Hila, NP    We recommend signing up for the patient portal called MyChart.  Sign up information is provided on this After Visit Summary.  MyChart is used to connect with patients for Virtual Visits (Telemedicine).  Patients are able to  view lab/test results, encounter notes, upcoming appointments, etc.  Non-urgent messages can be sent to your provider as well.   To learn more about what you can do with MyChart, go to [email or web address].

## 2023-10-30 NOTE — Progress Notes (Signed)
 Cardiology Office Note:    Date:  10/30/2023   ID:  Benjamin Conley, DOB 1965-11-12, MRN [id number]  PCP:  Dineen Rollene MATSU, [name]  [facility] Cardiologist:  Redell Cave, MD  [facility] Electrophysiologist:  None   Referring MD: Dineen Rollene MATSU, [name]   Chief Complaint  Patient presents with   3 month follow up     Doing well.    History of Present Illness:    Benjamin Conley is a 58 y.o. male with a hx of diabetes, orthostatic hypotension, inappropriate sinus tach, splenic infarct s/p stent placement 2021, COPD, hyperlipidemia, former smoker x40+ years who presents for follow-up.    Previously seen due to tachycardia and chest pain.  Chest pain appears noncardiac .  Lexiscan  Myoview  obtained to evaluate any significant ischemia.  States feeling well, denies any symptoms currently.  Digoxin  was increased due to elevated heart rates.  Has been feeling well since, has no concerns at this time.   Prior notes Echo 09/2019 normal systolic and diastolic function, EF 55 to 60%. Lexiscan  Myoview  08/2019 low risk, no evidence for ischemia, no significant coronary artery calcification Cardiac monitor on May/2022, no significant arrhythmias.  Patient had worsening left upper quadrant abdominal pain.  He presented to the ED  where CT abdomen dated 07/2019 showed distal splenic aneurysm no greater than 6 mm, small dissection, likely source of splenic infarctions.  He was diagnosed with splenic infarct and had a stent placed to the splenic artery.    Denies palpitations or irregular heartbeats.  States his heart rates have been elevated over the past 2 to 3 weeks, up to 120 bpm upon checks at home.   Past Medical History:  Diagnosis Date   Acute posthemorrhagic anemia    Allergy 2015   Arthritis    Asthma 2008   Carotid artery stenosis    bilateral   Complication of anesthesia    c/o difficulty breathing after anesthesia   COPD (chronic obstructive pulmonary disease)  (HCC)    Coronary artery disease    Diabetes mellitus without complication (HCC)    GERD (gastroesophageal reflux disease)    Hyperlipemia    Hypertension    Melena 08/21/2019   Paroxysmal supraventricular tachycardia (HCC) 08/19/2019   Postural dizziness with presyncope 08/21/2019   Renal disorder    early stage CKD   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   Tobacco abuse    Ulcer    Upper GI bleed 08/21/2019    Past Surgical History:  Procedure Laterality Date   BACK SURGERY     lumbar   COLONOSCOPY WITH PROPOFOL  N/A 08/24/2019   Procedure: COLONOSCOPY WITH PROPOFOL ;  Surgeon: Jinny Carmine, MD;  Location: [facility] ENDOSCOPY;  Service: Endoscopy;  Laterality: N/A;   ESOPHAGOGASTRODUODENOSCOPY (EGD) WITH PROPOFOL  N/A 08/24/2019   Procedure: ESOPHAGOGASTRODUODENOSCOPY (EGD) WITH PROPOFOL ;  Surgeon: Jinny Carmine, MD;  Location: [facility] ENDOSCOPY;  Service: Endoscopy;  Laterality: N/A;   SPINE SURGERY  2008   STENT PLACEMENT VASCULAR ([facility] HX)  07/2019   stenosis of distal splenic artery and stent placed   VISCERAL ANGIOGRAPHY N/A 08/06/2019   Procedure: VISCERAL ANGIOGRAPHY;  Surgeon: Jama Cordella MATSU, MD;  Location: [facility] INVASIVE CV LAB;  Service: Cardiovascular;  Laterality: N/A;    Current Medications: Current Meds  Medication Sig   albuterol  (VENTOLIN  HFA) 108 (90 Base) MCG/ACT inhaler Inhale 2 puffs into the lungs every 6 (six) hours as needed for wheezing or shortness of breath.   aspirin  81 MG EC tablet Take  1 tablet (81 mg total) by mouth daily.   atorvastatin  (LIPITOR) 40 MG tablet Take 20 mg by mouth daily.   budesonide -glycopyrrolate -formoterol  (BREZTRI ) 160-9-4.8 MCG/ACT AERO inhaler Inhale 2 puffs into the lungs 2 (two) times daily.   clopidogrel  (PLAVIX ) 75 MG tablet Take 1 tablet by mouth once daily   cyanocobalamin  (VITAMIN B12) 1000 MCG tablet Take 1,000 mcg by mouth daily.   digoxin  (LANOXIN ) 0.25 MG tablet Take 1 tablet (0.25 mg total) by mouth daily.   empagliflozin   (JARDIANCE ) 25 MG TABS tablet Take 1 tablet (25 mg total) by mouth daily before breakfast.   Ensifentrine  (OHTUVAYRE ) 3 MG/2.5ML SUSP Inhale 2.5 mLs into the lungs 2 (two) times daily.   famotidine  (PEPCID ) 20 MG tablet Take 1 tablet (20 mg total) by mouth at bedtime.   glimepiride  (AMARYL ) 1 MG tablet Take 1 tablet (1 mg total) by mouth daily with breakfast.   ipratropium-albuterol  (DUONEB) 0.5-2.5 (3) MG/3ML SOLN Take 3 mLs by nebulization every 6 (six) hours.   midodrine  (PROAMATINE ) 10 MG tablet Take 10 mg by mouth in the morning.   Semaglutide , 2 MG/DOSE, (OZEMPIC , 2 MG/DOSE,) 8 MG/3ML SOPN INJECT 2 MG SUBCUTANEOUSLY ONCE A WEEK   silodosin  (RAPAFLO ) 8 MG CAPS capsule TAKE 1 CAPSULE BY MOUTH ONCE DAILY WITH BREAKFAST   triamcinolone  cream (KENALOG ) 0.1 % Apply 1 application topically 2 (two) times daily.   [DISCONTINUED] atorvastatin  (LIPITOR) 40 MG tablet Take 1 tablet (40 mg total) by mouth daily.     Allergies:   Wasp venom, Wasp venom protein, Coffee flavoring agent (non-screening), Onion, and Prednisone    Social History   Socioeconomic History   Marital status: Single    Spouse name: Not on file   Number of children: Not on file   Years of education: Not on file   Highest education level: Not on file  Occupational History   Not on file  Tobacco Use   Smoking status: Former    Current packs/day: 0.00    Average packs/day: 2.0 packs/day for 48.0 years (96.0 ttl pk-yrs)    Types: Cigarettes    Start date: 02/12/1972    Quit date: 02/12/2020    Years since quitting: 3.7   Smokeless tobacco: Never  Vaping Use   Vaping status: Never Used  Substance and Sexual Activity   Alcohol use: No    Comment: Never been a problem   Drug use: No   Sexual activity: Yes  Other Topics Concern   Not on file  Social History Narrative   Single, lives alone, unemployed.   Social Drivers of Corporate investment banker Strain: Low Risk  (08/28/2023)   Overall Financial Resource Strain  (CARDIA)    Difficulty of Paying Living Expenses: Not hard at all  Recent Concern: Financial Resource Strain - Medium Risk (08/21/2023)   Received from [facility]   Overall Financial Resource Strain (CARDIA)    Difficulty of Paying Living Expenses: Somewhat hard  Food Insecurity: Food Insecurity Present (08/28/2023)   Hunger Vital Sign    Worried About Running Out of Food in the Last Year: Sometimes true    Ran Out of Food in the Last Year: Never true  Transportation Needs: No Transportation Needs (08/28/2023)   PRAPARE - Administrator, Civil Service (Medical): No    Lack of Transportation (Non-Medical): No  Physical Activity: Insufficiently Active (08/28/2023)   Exercise Vital Sign    Days of Exercise per Week: 3 days  Minutes of Exercise per Session: 10 min  Stress: No Stress Concern Present (08/28/2023)   [employer] of Occupational Health - Occupational Stress Questionnaire    Feeling of Stress: Not at all  Social Connections: Socially Isolated (08/28/2023)   Social Connection and Isolation Panel    Frequency of Communication with Friends and Family: Never    Frequency of Social Gatherings with Friends and Family: Never    Attends Religious Services: Never    Database administrator or Organizations: No    Attends Engineer, structural: Not on file    Marital Status: Never married     Family History: The patient's family history includes Cancer in his maternal aunt; Colon cancer in his maternal uncle; Diabetes in his brother, father, mother, sister, sister, sister, and sister; Diabetes Mellitus II in his brother and mother; Heart disease in his father; Hyperlipidemia in his brother; Obesity in his brother, mother, sister, sister, sister, and sister; Tuberculosis in his father. There is no history of Prostate cancer.  ROS:   Please see the history of present illness.     All other systems reviewed and are negative.  EKGs/Labs/Other  Studies Reviewed:    The following studies were reviewed today:        Recent Labs: 06/12/2023: TSH 0.63 06/14/2023: ALT 20 09/18/2023: Magnesium 2.1 09/19/2023: Hemoglobin 13.3; Platelets 406 09/23/2023: BUN 15; Creatinine, Ser 0.66; Potassium 3.9; Sodium 140  Recent Lipid Panel    Component Value Date/Time   CHOL 133 06/12/2023 1110   CHOL 138 02/23/2021 1531   TRIG 65.0 06/12/2023 1110   HDL 43.30 06/12/2023 1110   HDL 41 02/23/2021 1531   CHOLHDL 3 06/12/2023 1110   VLDL 13.0 06/12/2023 1110   LDLCALC 77 06/12/2023 1110   LDLCALC 74 02/23/2021 1531   LDLCALC 40 01/21/2020 1505   LDLDIRECT 89.0 11/13/2021 1131    Physical Exam:    VS:  BP 110/68 (BP Location: Left Arm, Patient Position: Sitting, Cuff Size: Normal)   Pulse 87   Ht 5' 5 (1.651 m)   Wt 163 lb (73.9 kg)   SpO2 98%   BMI 27.12 kg/m     Wt Readings from Last 3 Encounters:  10/30/23 163 lb (73.9 kg)  10/10/23 163 lb 6.4 oz (74.1 kg)  10/07/23 163 lb (73.9 kg)     GEN:  Well nourished, well developed in no acute distress HEENT: Normal NECK: No JVD; No carotid bruits CARDIAC: Regular rate and rhythm. RESPIRATORY:  Clear to auscultation without rales, wheezing or rhonchi  ABDOMEN: Soft, non-tender, non-distended MUSCULOSKELETAL:  No edema; No deformity  SKIN: Warm and dry NEUROLOGIC:  Alert and oriented x 3 PSYCHIATRIC:  Normal affect   ASSESSMENT:    1. Chest pain, unspecified type   2. Inappropriate sinus tachycardia (HCC)   3. Mixed hyperlipidemia   4. Medication management    PLAN:    In order of problems listed above:  Chest pain, several risk factors, Lexiscan  Myoview  showed no significant ischemia.  EF normal. Inappropriate sinus tachycardia, improved with digoxin .  Continue digoxin  0.25 mg daily.  Obtain digoxin  levels in 2 weeks. Hyperlipidemia, cholesterol controlled, continue Lipitor 40 mg daily.  Follow-up 6-12 months.  .      Medication Adjustments/Labs and Tests  Ordered: Current medicines are reviewed at length with the patient today.  Concerns regarding medicines are outlined above.  Orders Placed This Encounter  Procedures   Digoxin  level   EKG 12-Lead    No  orders of the defined types were placed in this encounter.    Patient Instructions  Medication Instructions:  Your physician recommends that you continue on your current medications as directed. Please refer to the Current Medication list given to you today.   *If you need a refill on your cardiac medications before your next appointment, please call your pharmacy*  Lab Work: Your provider would like for you to return in 2 weeks to have the following labs drawn: DIgoxin .   Please go to [facility] [address] [id number], [place] [address] You do not need an appointment.  They are open from 8 am- 4:30 pm.  Lunch from 1:00 pm- 2:00 pm You do not need to be fasting.  If you have labs (blood work) drawn today and your tests are completely normal, you will receive your results only by: MyChart Message (if you have MyChart) OR A paper copy in the mail If you have any lab test that is abnormal or we need to change your treatment, we will call you to review the results.  Testing/Procedures: No test ordered today   Follow-Up: At [facility], you and your health needs are our priority.  As part of our continuing mission to provide you with exceptional heart care, our providers are all part of one team.  This team includes your primary Cardiologist (physician) and Advanced Practice Providers or APPs (Physician Assistants and Nurse Practitioners) who all work together to provide you with the care you need, when you need it.  Your next appointment:   1 year(s)  Provider:   You may see Redell Cave, MD or one of the following Advanced Practice Providers on your designated Care Team:   Lonni Meager, NP Lesley Maffucci, PA-C Bernardino Bring,  PA-C Cadence [place], PA-C Tylene Lunch, NP Barnie Hila, NP    We recommend signing up for the patient portal called MyChart.  Sign up information is provided on this After Visit Summary.  MyChart is used to connect with patients for Virtual Visits (Telemedicine).  Patients are able to view lab/test results, encounter notes, upcoming appointments, etc.  Non-urgent messages can be sent to your provider as well.   To learn more about what you can do with MyChart, go to [email or web address].          Signed, Redell Cave, MD  10/30/2023 12:50 PM    [facility]

## 2023-10-31 ENCOUNTER — Ambulatory Visit (INDEPENDENT_AMBULATORY_CARE_PROVIDER_SITE_OTHER): Payer: 59 | Admitting: *Deleted

## 2023-10-31 VITALS — Ht 64.0 in | Wt 163.0 lb

## 2023-10-31 DIAGNOSIS — Z0000 Encounter for general adult medical examination without abnormal findings: Secondary | ICD-10-CM | POA: Diagnosis not present

## 2023-10-31 NOTE — Progress Notes (Signed)
 Subjective:   Benjamin Conley is a 58 y.o. who presents for a Medicare Wellness preventive visit.  As a reminder, Annual Wellness Visits don't include a physical exam, and some assessments may be limited, especially if this visit is performed virtually. We may recommend an in-person follow-up visit with your provider if needed.  Visit Complete: Virtual I connected with  Benjamin Conley on 10/31/23 by a audio enabled telemedicine application and verified that I am speaking with the correct person using two identifiers.  Patient Location: Home  Provider Location: Home Office  I discussed the limitations of evaluation and management by telemedicine. The patient expressed understanding and agreed to proceed.  Vital Signs: Because this visit was a virtual/telehealth visit, some criteria may be missing or patient reported. Any vitals not documented were not able to be obtained and vitals that have been documented are patient reported.  VideoDeclined- This patient declined Librarian, academic. Therefore the visit was completed with audio only.  Persons Participating in Visit: Patient.  AWV Questionnaire: Yes: Patient Medicare AWV questionnaire was completed by the patient on 10/27/23; I have confirmed that all information answered by patient is correct and no changes since this date.  Cardiac Risk Factors include: advanced age (>53men, >34 women);diabetes mellitus;dyslipidemia;hypertension;Other (see comment);male gender, Risk factor comments: H/O smoking quit 3 years ago     Objective:    Today's Vitals   10/31/23 1011  Weight: 163 lb (73.9 kg)  Height: 5' 4 (1.626 m)   Body mass index is 27.98 kg/m.     10/31/2023   10:30 AM 09/19/2023    1:36 AM 09/18/2023    1:37 PM 08/30/2023   11:33 AM 08/07/2023    3:32 PM 06/22/2023   12:19 PM 10/29/2022    2:02 PM  Advanced Directives  Does Patient Have a Medical Advance Directive? No No No No No No No   Would patient like information on creating a medical advance directive? No - Patient declined  No - Patient declined  No - Patient declined  No - Patient declined    Current Medications (verified) Outpatient Encounter Medications as of 10/31/2023  Medication Sig   albuterol  (VENTOLIN  HFA) 108 (90 Base) MCG/ACT inhaler Inhale 2 puffs into the lungs every 6 (six) hours as needed for wheezing or shortness of breath.   aspirin  81 MG EC tablet Take 1 tablet (81 mg total) by mouth daily.   atorvastatin  (LIPITOR) 40 MG tablet Take 20 mg by mouth daily.   budesonide -glycopyrrolate -formoterol  (BREZTRI ) 160-9-4.8 MCG/ACT AERO inhaler Inhale 2 puffs into the lungs 2 (two) times daily.   clopidogrel  (PLAVIX ) 75 MG tablet Take 1 tablet by mouth once daily   Continuous Glucose Sensor (FREESTYLE LIBRE 3 PLUS SENSOR) MISC Change sensor every 15 days.   Continuous Glucose Sensor (FREESTYLE LIBRE 3 SENSOR) MISC USE 1 SENSOR EVERY 14 DAYS TO CHECK GLUCOSE CONTINUOUSLY   cyanocobalamin  (VITAMIN B12) 1000 MCG tablet Take 1,000 mcg by mouth daily.   digoxin  (LANOXIN ) 0.25 MG tablet Take 1 tablet (0.25 mg total) by mouth daily.   empagliflozin  (JARDIANCE ) 25 MG TABS tablet Take 1 tablet (25 mg total) by mouth daily before breakfast.   famotidine  (PEPCID ) 20 MG tablet Take 1 tablet (20 mg total) by mouth at bedtime.   glimepiride  (AMARYL ) 1 MG tablet Take 1 tablet (1 mg total) by mouth daily with breakfast.   ipratropium-albuterol  (DUONEB) 0.5-2.5 (3) MG/3ML SOLN Take 3 mLs by nebulization every 6 (six) hours.  midodrine  (PROAMATINE ) 10 MG tablet Take 10 mg by mouth in the morning.   Semaglutide , 2 MG/DOSE, (OZEMPIC , 2 MG/DOSE,) 8 MG/3ML SOPN INJECT 2 MG SUBCUTANEOUSLY ONCE A WEEK   silodosin  (RAPAFLO ) 8 MG CAPS capsule TAKE 1 CAPSULE BY MOUTH ONCE DAILY WITH BREAKFAST   triamcinolone  cream (KENALOG ) 0.1 % Apply 1 application topically 2 (two) times daily.   blood glucose meter kit and supplies Dispense based on  patient and insurance preference. Use up to four times daily as directed. (FOR ICD-10 E10.9, E11.9). (Patient not taking: Reported on 10/31/2023)   Ensifentrine  (OHTUVAYRE ) 3 MG/2.5ML SUSP Inhale 2.5 mLs into the lungs 2 (two) times daily. (Patient not taking: Reported on 10/31/2023)   No facility-administered encounter medications on file as of 10/31/2023.    Allergies (verified) Wasp venom, Wasp venom protein, Coffee flavoring agent (non-screening), Onion, and Prednisone    History: Past Medical History:  Diagnosis Date   Acute posthemorrhagic anemia    Allergy 2015   Arthritis    Asthma 2008   Carotid artery stenosis    bilateral   Complication of anesthesia    c/o difficulty breathing after anesthesia   COPD (chronic obstructive pulmonary disease) (HCC)    Coronary artery disease    Diabetes mellitus without complication (HCC)    GERD (gastroesophageal reflux disease)    Hyperlipemia    Hypertension    Melena 08/21/2019   Paroxysmal supraventricular tachycardia (HCC) 08/19/2019   Postural dizziness with presyncope 08/21/2019   Renal disorder    early stage CKD   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   Tobacco abuse    Ulcer    Upper GI bleed 08/21/2019   Past Surgical History:  Procedure Laterality Date   BACK SURGERY     lumbar   COLONOSCOPY WITH PROPOFOL  N/A 08/24/2019   Procedure: COLONOSCOPY WITH PROPOFOL ;  Surgeon: Jinny Carmine, MD;  Location: [facility] ENDOSCOPY;  Service: Endoscopy;  Laterality: N/A;   ESOPHAGOGASTRODUODENOSCOPY (EGD) WITH PROPOFOL  N/A 08/24/2019   Procedure: ESOPHAGOGASTRODUODENOSCOPY (EGD) WITH PROPOFOL ;  Surgeon: Jinny Carmine, MD;  Location: [facility] ENDOSCOPY;  Service: Endoscopy;  Laterality: N/A;   SPINE SURGERY  2008   STENT PLACEMENT VASCULAR ([facility] HX)  07/2019   stenosis of distal splenic artery and stent placed   VISCERAL ANGIOGRAPHY N/A 08/06/2019   Procedure: VISCERAL ANGIOGRAPHY;  Surgeon: Jama Cordella MATSU, MD;  Location: [facility] INVASIVE CV LAB;   Service: Cardiovascular;  Laterality: N/A;   Family History  Problem Relation Age of Onset   Diabetes Mother    Diabetes Mellitus II Mother    Obesity Mother    Tuberculosis Father    Heart disease Father    Diabetes Father    Diabetes Sister    Diabetes Sister    Diabetes Sister    Diabetes Sister    Diabetes Brother    Diabetes Mellitus II Brother    Hyperlipidemia Brother    Obesity Brother    Cancer Maternal Aunt    Colon cancer Maternal Uncle    Obesity Sister    Obesity Sister    Obesity Sister    Obesity Sister    Prostate cancer Neg Hx    Social History   Socioeconomic History   Marital status: Single    Spouse name: Not on file   Number of children: Not on file   Years of education: Not on file   Highest education level: Not on file  Occupational History   Not on file  Tobacco Use   Smoking status: Former  Current packs/day: 0.00    Average packs/day: 2.0 packs/day for 48.0 years (96.0 ttl pk-yrs)    Types: Cigarettes    Start date: 02/12/1972    Quit date: 02/12/2020    Years since quitting: 3.7   Smokeless tobacco: Never  Vaping Use   Vaping status: Never Used  Substance and Sexual Activity   Alcohol use: No    Comment: Never been a problem   Drug use: No   Sexual activity: Yes  Other Topics Concern   Not on file  Social History Narrative   Single, lives alone, unemployed.   Social Drivers of Corporate investment banker Strain: Low Risk  (10/31/2023)   Overall Financial Resource Strain (CARDIA)    Difficulty of Paying Living Expenses: Not hard at all  Recent Concern: Financial Resource Strain - Medium Risk (08/21/2023)   Received from [facility]   Overall Financial Resource Strain (CARDIA)    Difficulty of Paying Living Expenses: Somewhat hard  Food Insecurity: No Food Insecurity (10/31/2023)   Hunger Vital Sign    Worried About Running Out of Food in the Last Year: Never true    Ran Out of Food in the Last Year: Never  true  Recent Concern: Food Insecurity - Food Insecurity Present (08/28/2023)   Hunger Vital Sign    Worried About Running Out of Food in the Last Year: Sometimes true    Ran Out of Food in the Last Year: Never true  Transportation Needs: No Transportation Needs (10/31/2023)   PRAPARE - Administrator, Civil Service (Medical): No    Lack of Transportation (Non-Medical): No  Physical Activity: Sufficiently Active (10/31/2023)   Exercise Vital Sign    Days of Exercise per Week: 6 days    Minutes of Exercise per Session: 60 min  Recent Concern: Physical Activity - Insufficiently Active (08/28/2023)   Exercise Vital Sign    Days of Exercise per Week: 3 days    Minutes of Exercise per Session: 10 min  Stress: No Stress Concern Present (10/31/2023)   [employer] of Occupational Health - Occupational Stress Questionnaire    Feeling of Stress: Not at all  Social Connections: Moderately Isolated (10/31/2023)   Social Connection and Isolation Panel    Frequency of Communication with Friends and Family: Once a week    Frequency of Social Gatherings with Friends and Family: More than three times a week    Attends Religious Services: Never    Database administrator or Organizations: Yes    Attends Banker Meetings: Never    Marital Status: Never married    Tobacco Counseling Counseling given: Not Answered    Clinical Intake:  Pre-visit preparation completed: Yes  Pain : No/denies pain     BMI - recorded: 27.98 Nutritional Status: BMI 25 -29 Overweight Nutritional Risks: None Diabetes: Yes CBG done?: Yes (FBS 149 per patient)  Lab Results  Component Value Date   HGBA1C 7.3 (A) 06/12/2023   HGBA1C 7.6 (A) 12/02/2022   HGBA1C 7.6 (A) 05/15/2022     How often do you need to have someone help you when you read instructions, pamphlets, or other written materials from your doctor or pharmacy?: 1 - Never  Interpreter Needed?: No  Information entered by ::  R. Rogelio Winbush LPN   Activities of Daily Living     10/27/2023    9:55 AM  In your present state of health, do you have any difficulty performing the following  activities:  Hearing? 1  Vision? 0  Difficulty concentrating or making decisions? 0  Walking or climbing stairs? 1  Dressing or bathing? 0  Doing errands, shopping? 0  Preparing Food and eating ? N  Using the Toilet? N  In the past six months, have you accidently leaked urine? N  Do you have problems with loss of bowel control? N  Managing your Medications? N  Managing your Finances? N  Housekeeping or managing your Housekeeping? N    Patient Care Team: Dineen Rollene MATSU, [name] as PCP - General (Family Medicine) Darliss Rogue, MD as PCP - Cardiology (Cardiology) Jacobo Evalene PARAS, MD as Consulting Physician (Oncology)  I have updated your Care Teams any recent Medical Services you may have received from other providers in the past year.     Assessment:   This is a routine wellness examination for Benjamin Conley.  Hearing/Vision screen Hearing Screening - Comments:: Wears aids Vision Screening - Comments:: glasses   Goals Addressed             This Visit's Progress    Patient Stated       Wants to be more active       Depression Screen     10/31/2023   10:26 AM 10/07/2023   10:17 AM 09/23/2023   12:05 PM 09/01/2023   12:17 PM 07/31/2023   11:22 AM 07/24/2023   10:20 AM 06/26/2023   11:52 AM  PHQ 2/9 Scores  PHQ - 2 Score 0 0 0 0 0 0 0  PHQ- 9 Score 1 0 0 0 0 0 0    Fall Risk     10/27/2023    9:55 AM 10/07/2023   10:17 AM 09/23/2023   12:04 PM 09/01/2023   12:17 PM 07/31/2023   11:21 AM  Fall Risk   Falls in the past year? 1 0 0 0 0  Number falls in past yr: 0 0 0 0 0  Injury with Fall? 1 0 0 0 0  Risk for fall due to : History of fall(s);Impaired mobility No Fall Risks No Fall Risks No Fall Risks No Fall Risks  Risk for fall due to: Comment slipped in tub trying to get out      Follow up Falls  evaluation completed;Falls prevention discussed Falls evaluation completed Falls evaluation completed Falls evaluation completed Falls evaluation completed    MEDICARE RISK AT HOME:  Medicare Risk at Home Any stairs in or around the home?: (Patient-Rptd) No If so, are there any without handrails?: (Patient-Rptd) No Home free of loose throw rugs in walkways, pet beds, electrical cords, etc?: (Patient-Rptd) Yes Adequate lighting in your home to reduce risk of falls?: (Patient-Rptd) Yes Life alert?: (Patient-Rptd) No Use of a cane, walker or w/c?: (Patient-Rptd) Yes Grab bars in the bathroom?: (Patient-Rptd) No Shower chair or bench in shower?: (Patient-Rptd) Yes Elevated toilet seat or a handicapped toilet?: (Patient-Rptd) No  TIMED UP AND GO:  Was the test performed?  No  Cognitive Function: 6CIT completed        10/31/2023   10:30 AM 10/29/2022    2:02 PM 10/23/2021   12:44 PM 10/19/2020    9:59 AM 10/19/2019    8:49 AM  6CIT Screen  What Year? 0 points 0 points 0 points 0 points 0 points  What month? 0 points 0 points 0 points 0 points 0 points  What time? 0 points 3 points 0 points 0 points   Count back from 20 0 points  0 points  0 points   Months in reverse 4 points 4 points 0 points 4 points 4 points  Repeat phrase 2 points 2 points  2 points   Total Score 6 points 9 points  6 points     Immunizations Immunization History  Administered Date(s) Administered   Td 03/11/2004   Tdap 08/02/2014    Screening Tests Health Maintenance  Topic Date Due   Pneumococcal Vaccine: 50+ Years (1 of 2 - PCV) Never done   Hepatitis B Vaccines 19-59 Average Risk (1 of 3 - 19+ 3-dose series) Never done   Diabetic kidney evaluation - Urine ACR  01/20/2021   Medicare Annual Wellness (AWV)  10/29/2023   INFLUENZA VACCINE  06/08/2024 (Originally 10/10/2023)   HEMOGLOBIN A1C  12/12/2023   OPHTHALMOLOGY EXAM  03/10/2024   FOOT EXAM  06/11/2024   DTaP/Tdap/Td (3 - Td or Tdap) 08/01/2024    Lung Cancer Screening  09/22/2024   Diabetic kidney evaluation - eGFR measurement  09/22/2024   Colonoscopy  08/23/2029   Hepatitis C Screening  Completed   HIV Screening  Completed   HPV VACCINES  Aged Out   Meningococcal B Vaccine  Aged Out   COVID-19 Vaccine  Discontinued   Zoster Vaccines- Shingrix  Discontinued    Health Maintenance  Health Maintenance Due  Topic Date Due   Pneumococcal Vaccine: 50+ Years (1 of 2 - PCV) Never done   Hepatitis B Vaccines 19-59 Average Risk (1 of 3 - 19+ 3-dose series) Never done   Diabetic kidney evaluation - Urine ACR  01/20/2021   Medicare Annual Wellness (AWV)  10/29/2023   Health Maintenance Items Addressed: Patient declines vaccines  Additional Screening:  Vision Screening: Recommended annual ophthalmology exams for early detection of glaucoma and other disorders of the eye. Up to date  [facility] Would you like a referral to an eye doctor? No    Dental Screening: Recommended annual dental exams for proper oral hygiene  Community Resource Referral / Chronic Care Management: CRR required this visit?  No   CCM required this visit?  No   Plan:    I have personally reviewed and noted the following in the patient's chart:   Medical and social history Use of alcohol, tobacco or illicit drugs  Current medications and supplements including opioid prescriptions. Patient is not currently taking opioid prescriptions. Functional ability and status Nutritional status Physical activity Advanced directives List of other physicians Hospitalizations, surgeries, and ER visits in previous 12 months Vitals Screenings to include cognitive, depression, and falls Referrals and appointments  In addition, I have reviewed and discussed with patient certain preventive protocols, quality metrics, and best practice recommendations. A written personalized care plan for preventive services as well as general preventive health recommendations were  provided to patient.   Angeline Fredericks, LPN   [id number]   After Visit Summary: (MyChart) Due to this being a telephonic visit, the after visit summary with patients personalized plan was offered to patient via MyChart   Notes: Nothing significant to report at this time.

## 2023-10-31 NOTE — Telephone Encounter (Signed)
 Authorization reply

## 2023-10-31 NOTE — Patient Instructions (Signed)
 Benjamin Conley , Thank you for taking time out of your busy schedule to complete your Annual Wellness Visit with me. I enjoyed our conversation and look forward to speaking with you again next year. I, as well as your care team,  appreciate your ongoing commitment to your health goals. Please review the following plan we discussed and let me know if I can assist you in the future. Your Game plan/ To Do List    Referrals: If you haven't heard from the office you've been referred to, please reach out to them at the phone provided.  Consider updating your vaccines   Follow up Visits: We will see or speak with you next year for your Next Medicare AWV with our clinical staff 11/02/24 @ 10:50 Have you seen your provider in the last 6 months (3 months if uncontrolled diabetes)? Yes  Clinician Recommendations:  Aim for 30 minutes of exercise or brisk walking, 6-8 glasses of water, and 5 servings of fruits and vegetables each day.       This is a list of the screenings recommended for you:  Health Maintenance  Topic Date Due   Pneumococcal Vaccine for age over 44 (1 of 2 - PCV) Never done   Hepatitis B Vaccine (1 of 3 - 19+ 3-dose series) Never done   Yearly kidney health urinalysis for diabetes  01/20/2021   Flu Shot  06/08/2024*   Hemoglobin A1C  12/12/2023   Eye exam for diabetics  03/10/2024   Complete foot exam   06/11/2024   DTaP/Tdap/Td vaccine (3 - Td or Tdap) 08/01/2024   Screening for Lung Cancer  09/22/2024   Yearly kidney function blood test for diabetes  09/22/2024   Medicare Annual Wellness Visit  10/30/2024   Colon Cancer Screening  08/23/2029   Hepatitis C Screening  Completed   HIV Screening  Completed   HPV Vaccine  Aged Out   Meningitis B Vaccine  Aged Out   COVID-19 Vaccine  Discontinued   Zoster (Shingles) Vaccine  Discontinued  *Topic was postponed. The date shown is not the original due date.    Advanced directives: (Declined) Advance directive discussed with you  today. Even though you declined this today, please call our office should you change your mind, and we can give you the proper paperwork for you to fill out. Advance Care Planning is important because it:  [x]  Makes sure you receive the medical care that is consistent with your values, goals, and preferences  [x]  It provides guidance to your family and loved ones and reduces their decisional burden about whether or not they are making the right decisions based on your wishes.

## 2023-11-04 ENCOUNTER — Ambulatory Visit (INDEPENDENT_AMBULATORY_CARE_PROVIDER_SITE_OTHER): Admitting: Family

## 2023-11-04 ENCOUNTER — Encounter: Payer: Self-pay | Admitting: Family

## 2023-11-04 ENCOUNTER — Other Ambulatory Visit (INDEPENDENT_AMBULATORY_CARE_PROVIDER_SITE_OTHER)

## 2023-11-04 ENCOUNTER — Ambulatory Visit: Payer: Self-pay | Admitting: Family

## 2023-11-04 VITALS — BP 118/72 | HR 83 | Temp 97.5°F | Resp 20 | Ht 64.0 in | Wt 161.4 lb

## 2023-11-04 DIAGNOSIS — M19011 Primary osteoarthritis, right shoulder: Secondary | ICD-10-CM

## 2023-11-04 DIAGNOSIS — M47892 Other spondylosis, cervical region: Secondary | ICD-10-CM | POA: Diagnosis not present

## 2023-11-04 DIAGNOSIS — E119 Type 2 diabetes mellitus without complications: Secondary | ICD-10-CM

## 2023-11-04 DIAGNOSIS — M19012 Primary osteoarthritis, left shoulder: Secondary | ICD-10-CM | POA: Diagnosis not present

## 2023-11-04 LAB — MICROALBUMIN / CREATININE URINE RATIO
Creatinine,U: 101 mg/dL
Microalb Creat Ratio: 19.6 mg/g (ref 0.0–30.0)
Microalb, Ur: 2 mg/dL — ABNORMAL HIGH (ref 0.0–1.9)

## 2023-11-04 MED ORDER — TRAMADOL HCL 50 MG PO TABS
50.0000 mg | ORAL_TABLET | Freq: Three times a day (TID) | ORAL | 0 refills | Status: AC | PRN
Start: 2023-11-04 — End: 2023-11-09

## 2023-11-04 NOTE — Progress Notes (Signed)
 Acute Office Visit  Subjective:     Patient ID: Benjamin Conley, male    DOB: 06/20/1965, 58 y.o.   MRN: [id number]  Chief Complaint  Patient presents with  . Fall    Had a fall 2 weeks ago hurt his back and shoulder     HPI Patient is in today with c/o persistent pain in his back and shoulder that has been an issue for several month but has become worse after sustaining a fall on July 29th. He has had xrays of the shoulders, t-spine, and c-spine that showed DDD. Was prescribed muscle relaxers that he did not pick up because he felt it would not help. NSAIDS have not helped. He is interested in a referral to ortho and PT. He reports decreased ROM to the left shoulder and pain when he attempts to lift his arm over his head. He also has pain in the right shoulder but it is not as bad as the right.   As of note, patient has taken Prednisone  in the past and was hospitalized with DKA.  Review of Systems  Respiratory: Negative.    Cardiovascular: Negative.   Musculoskeletal:  Positive for back pain and neck pain.       Bilateral shoulder pain, left worse than the right  Neurological: Negative.   Psychiatric/Behavioral: Negative.    All other systems reviewed and are negative.  Past Medical History:  Diagnosis Date  . Acute posthemorrhagic anemia   . Allergy 2015  . Arthritis   . Asthma 2008  . Carotid artery stenosis    bilateral  . Complication of anesthesia    c/o difficulty breathing after anesthesia  . COPD (chronic obstructive pulmonary disease) (HCC)   . Coronary artery disease   . Diabetes mellitus without complication (HCC)   . GERD (gastroesophageal reflux disease)   . Hyperlipemia   . Hypertension   . Melena 08/21/2019  . Paroxysmal supraventricular tachycardia (HCC) 08/19/2019  . Postural dizziness with presyncope 08/21/2019  . Renal disorder    early stage CKD  . 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 Tobacco abuse   . Ulcer   . Upper GI bleed 08/21/2019     Social History   Socioeconomic History  . Marital status: Single    Spouse name: Not on file  . Number of children: Not on file  . Years of education: Not on file  . Highest education level: Not on file  Occupational History  . Not on file  Tobacco Use  . Smoking status: Former    Current packs/day: 0.00    Average packs/day: 2.0 packs/day for 48.0 years (96.0 ttl pk-yrs)    Types: Cigarettes    Start date: 02/12/1972    Quit date: 02/12/2020    Years since quitting: 3.7  . Smokeless tobacco: Never  Vaping Use  . Vaping status: Never Used  Substance and Sexual Activity  . Alcohol use: No    Comment: Never been a problem  . Drug use: No  . Sexual activity: Yes  Other Topics Concern  . Not on file  Social History Narrative   Single, lives alone, unemployed.   Social Drivers of Corporate investment banker Strain: Low Risk  (10/31/2023)   Overall Financial Resource Strain (CARDIA)   . Difficulty of Paying Living Expenses: Not hard at all  Recent Concern: Financial Resource Strain - Medium Risk (08/21/2023)   Received from [facility]   Overall Financial Resource Strain (CARDIA)   .  Difficulty of Paying Living Expenses: Somewhat hard  Food Insecurity: No Food Insecurity (10/31/2023)   Hunger Vital Sign   . Worried About Programme researcher, broadcasting/film/video in the Last Year: Never true   . Ran Out of Food in the Last Year: Never true  Recent Concern: Food Insecurity - Food Insecurity Present (08/28/2023)   Hunger Vital Sign   . Worried About Programme researcher, broadcasting/film/video in the Last Year: Sometimes true   . Ran Out of Food in the Last Year: Never true  Transportation Needs: No Transportation Needs (10/31/2023)   PRAPARE - Transportation   . Lack of Transportation (Medical): No   . Lack of Transportation (Non-Medical): No  Physical Activity: Sufficiently Active (10/31/2023)   Exercise Vital Sign   . Days of Exercise per Week: 6 days   . Minutes of Exercise per Session: 60 min   Recent Concern: Physical Activity - Insufficiently Active (08/28/2023)   Exercise Vital Sign   . Days of Exercise per Week: 3 days   . Minutes of Exercise per Session: 10 min  Stress: No Stress Concern Present (10/31/2023)   [employer] of Occupational Health - Occupational Stress Questionnaire   . Feeling of Stress: Not at all  Social Connections: Moderately Isolated (10/31/2023)   Social Connection and Isolation Panel   . Frequency of Communication with Friends and Family: Once a week   . Frequency of Social Gatherings with Friends and Family: More than three times a week   . Attends Religious Services: Never   . Active Member of Clubs or Organizations: Yes   . Attends Banker Meetings: Never   . Marital Status: Never married  Intimate Partner Violence: Not At Risk (10/31/2023)   Humiliation, Afraid, Rape, and Kick questionnaire   . Fear of Current or Ex-Partner: No   . Emotionally Abused: No   . Physically Abused: No   . Sexually Abused: No    Past Surgical History:  Procedure Laterality Date  . BACK SURGERY     lumbar  . COLONOSCOPY WITH PROPOFOL  N/A 08/24/2019   Procedure: COLONOSCOPY WITH PROPOFOL ;  Surgeon: Jinny Carmine, MD;  Location: [facility] ENDOSCOPY;  Service: Endoscopy;  Laterality: N/A;  . ESOPHAGOGASTRODUODENOSCOPY (EGD) WITH PROPOFOL  N/A 08/24/2019   Procedure: ESOPHAGOGASTRODUODENOSCOPY (EGD) WITH PROPOFOL ;  Surgeon: Jinny Carmine, MD;  Location: [facility] ENDOSCOPY;  Service: Endoscopy;  Laterality: N/A;  . SPINE SURGERY  2008  . STENT PLACEMENT VASCULAR ([facility] HX)  07/2019   stenosis of distal splenic artery and stent placed  . VISCERAL ANGIOGRAPHY N/A 08/06/2019   Procedure: VISCERAL ANGIOGRAPHY;  Surgeon: Jama Cordella MATSU, MD;  Location: [facility] INVASIVE CV LAB;  Service: Cardiovascular;  Laterality: N/A;    Family History  Problem Relation Age of Onset  . Diabetes Mother   . Diabetes Mellitus II Mother   . Obesity Mother   . Tuberculosis Father    . Heart disease Father   . Diabetes Father   . Diabetes Sister   . Diabetes Sister   . Diabetes Sister   . Diabetes Sister   . Diabetes Brother   . Diabetes Mellitus II Brother   . Hyperlipidemia Brother   . Obesity Brother   . Cancer Maternal Aunt   . Colon cancer Maternal Uncle   . Obesity Sister   . Obesity Sister   . Obesity Sister   . Obesity Sister   . Prostate cancer Neg Hx     Allergies  Allergen Reactions  . Wasp Venom Anaphylaxis  .  Wasp Venom Protein Anaphylaxis  . Coffee Flavoring Agent (Non-Screening) Nausea And Vomiting    Patient states coffee gives him nausea and stomach cramps  . Onion Nausea And Vomiting and Nausea Only  . Prednisone  Other (See Comments)    DKA after IV solu medrol  following by oral prednionse  prednisone     Current Outpatient Medications on File Prior to Visit  Medication Sig Dispense Refill  . albuterol  (VENTOLIN  HFA) 108 (90 Base) MCG/ACT inhaler Inhale 2 puffs into the lungs every 6 (six) hours as needed for wheezing or shortness of breath. 8 g 2  . aspirin  81 MG EC tablet Take 1 tablet (81 mg total) by mouth daily. 90 tablet 3  . atorvastatin  (LIPITOR) 40 MG tablet Take 20 mg by mouth daily.    . blood glucose meter kit and supplies Dispense based on patient and insurance preference. Use up to four times daily as directed. (FOR ICD-10 E10.9, E11.9). 1 each 0  . budesonide -glycopyrrolate -formoterol  (BREZTRI ) 160-9-4.8 MCG/ACT AERO inhaler Inhale 2 puffs into the lungs 2 (two) times daily. 10.7 g 1  . clopidogrel  (PLAVIX ) 75 MG tablet Take 1 tablet by mouth once daily 90 tablet 1  . Continuous Glucose Sensor (FREESTYLE LIBRE 3 PLUS SENSOR) MISC Change sensor every 15 days. 2 each 3  . Continuous Glucose Sensor (FREESTYLE LIBRE 3 SENSOR) MISC USE 1 SENSOR EVERY 14 DAYS TO CHECK GLUCOSE CONTINUOUSLY 4 each 0  . cyanocobalamin  (VITAMIN B12) 1000 MCG tablet Take 1,000 mcg by mouth daily.    . digoxin  (LANOXIN ) 0.25 MG tablet Take 1 tablet  (0.25 mg total) by mouth daily. 30 tablet 3  . empagliflozin  (JARDIANCE ) 25 MG TABS tablet Take 1 tablet (25 mg total) by mouth daily before breakfast. 90 tablet 3  . Ensifentrine  (OHTUVAYRE ) 3 MG/2.5ML SUSP Inhale 2.5 mLs into the lungs 2 (two) times daily.    . famotidine  (PEPCID ) 20 MG tablet Take 1 tablet (20 mg total) by mouth at bedtime. 90 tablet 1  . glimepiride  (AMARYL ) 1 MG tablet Take 1 tablet (1 mg total) by mouth daily with breakfast. 10 tablet 0  . ipratropium-albuterol  (DUONEB) 0.5-2.5 (3) MG/3ML SOLN Take 3 mLs by nebulization every 6 (six) hours. 360 mL 1  . midodrine  (PROAMATINE ) 10 MG tablet Take 10 mg by mouth in the morning.    . Semaglutide , 2 MG/DOSE, (OZEMPIC , 2 MG/DOSE,) 8 MG/3ML SOPN INJECT 2 MG SUBCUTANEOUSLY ONCE A WEEK 9 mL 0  . silodosin  (RAPAFLO ) 8 MG CAPS capsule TAKE 1 CAPSULE BY MOUTH ONCE DAILY WITH BREAKFAST 30 capsule 0  . triamcinolone  cream (KENALOG ) 0.1 % Apply 1 application topically 2 (two) times daily. 30 g 0   No current facility-administered medications on file prior to visit.    BP 118/72   Pulse 83   Temp (!) 97.5 F (36.4 C)   Resp 20   Ht 5' 4 (1.626 m)   Wt 161 lb 6 oz (73.2 kg)   SpO2 99%   BMI 27.70 kg/m chart      Objective:    BP 118/72   Pulse 83   Temp (!) 97.5 F (36.4 C)   Resp 20   Ht 5' 4 (1.626 m)   Wt 161 lb 6 oz (73.2 kg)   SpO2 99%   BMI 27.70 kg/m    Physical Exam Constitutional:      Appearance: Normal appearance. He is normal weight.  Cardiovascular:     Rate and Rhythm: Normal rate and regular  rhythm.  Pulmonary:     Effort: Pulmonary effort is normal.     Breath sounds: Normal breath sounds.  Musculoskeletal:        General: Tenderness present. No swelling.     Comments: Positive empty can test bilaterally. Mild tenderness to palpation of the left shoulder posteriorly. Pain also elicits blaterally with forced flexion, abduction. Full ROM of the neck with minimal discomfort.   Skin:    General:  Skin is warm and dry.  Neurological:     General: No focal deficit present.     Mental Status: He is alert and oriented to person, place, and time. Mental status is at baseline.  Psychiatric:        Mood and Affect: Mood normal.        Behavior: Behavior normal.        Thought Content: Thought content normal.    No results found for any visits on 11/04/23.      Assessment & Plan:   Problem List Items Addressed This Visit   None Visit Diagnoses       Other osteoarthritis of spine, cervical region    -  Primary   Relevant Orders   Ambulatory referral to Orthopedic Surgery   Ambulatory referral to Physical Therapy     Primary osteoarthritis of left shoulder       Relevant Medications   traMADol  (ULTRAM ) 50 MG tablet   Other Relevant Orders   Ambulatory referral to Orthopedic Surgery   Ambulatory referral to Physical Therapy     Primary osteoarthritis of right shoulder       Relevant Medications   traMADol  (ULTRAM ) 50 MG tablet   Other Relevant Orders   Ambulatory referral to Orthopedic Surgery   Ambulatory referral to Physical Therapy       Meds ordered this encounter  Medications  . traMADol  (ULTRAM ) 50 MG tablet    Sig: Take 1 tablet (50 mg total) by mouth every 8 (eight) hours as needed for up to 5 days.    Dispense:  15 tablet    Refill:  0   Follow-up with PT and Orthopedics. Short course of Tramadol  prescribed as needed for severe pain. Call the office is symptoms worsen prior to your appointment with Ortho.  No follow-ups on file.  Colette Dicamillo B Shalom Mcguiness, [name]

## 2023-11-10 NOTE — Telephone Encounter (Signed)
 denial

## 2023-11-13 ENCOUNTER — Other Ambulatory Visit (INDEPENDENT_AMBULATORY_CARE_PROVIDER_SITE_OTHER): Payer: Self-pay | Admitting: Nurse Practitioner

## 2023-11-13 DIAGNOSIS — D735 Infarction of spleen: Secondary | ICD-10-CM

## 2023-11-19 NOTE — Addendum Note (Signed)
 Addended by: Danyiel Crespin L on: 11/19/2023 10:18 AM   Modules accepted: Orders

## 2023-11-20 ENCOUNTER — Encounter: Payer: Self-pay | Admitting: Family

## 2023-11-20 DIAGNOSIS — E119 Type 2 diabetes mellitus without complications: Secondary | ICD-10-CM

## 2023-11-21 NOTE — Telephone Encounter (Signed)
 PT needs pa for [facility] 3 plus sensor

## 2023-11-22 ENCOUNTER — Other Ambulatory Visit: Payer: Self-pay | Admitting: Family

## 2023-11-23 ENCOUNTER — Encounter: Payer: Self-pay | Admitting: Family

## 2023-11-24 ENCOUNTER — Ambulatory Visit (INDEPENDENT_AMBULATORY_CARE_PROVIDER_SITE_OTHER): Admitting: Pulmonary Disease

## 2023-11-24 ENCOUNTER — Ambulatory Visit
Admission: RE | Admit: 2023-11-24 | Discharge: 2023-11-24 | Disposition: A | Discharge status: Home / Self Care | Source: Ambulatory Visit

## 2023-11-24 ENCOUNTER — Ambulatory Visit: Admitting: Pulmonary Disease

## 2023-11-24 ENCOUNTER — Encounter: Payer: Self-pay | Admitting: Pulmonary Disease

## 2023-11-24 ENCOUNTER — Other Ambulatory Visit: Payer: Self-pay

## 2023-11-24 ENCOUNTER — Ambulatory Visit: Admission: RE | Admit: 2023-11-24 | Discharge: 2023-11-24 | Disposition: A | Discharge status: Home / Self Care

## 2023-11-24 VITALS — BP 130/86 | HR 86 | Temp 97.6°F | Ht 64.0 in | Wt 161.2 lb

## 2023-11-24 DIAGNOSIS — J449 Chronic obstructive pulmonary disease, unspecified: Secondary | ICD-10-CM

## 2023-11-24 DIAGNOSIS — Z87891 Personal history of nicotine dependence: Secondary | ICD-10-CM | POA: Diagnosis not present

## 2023-11-24 DIAGNOSIS — M25511 Pain in right shoulder: Secondary | ICD-10-CM | POA: Insufficient documentation

## 2023-11-24 DIAGNOSIS — M19012 Primary osteoarthritis, left shoulder: Secondary | ICD-10-CM | POA: Diagnosis not present

## 2023-11-24 DIAGNOSIS — J439 Emphysema, unspecified: Secondary | ICD-10-CM

## 2023-11-24 DIAGNOSIS — M25512 Pain in left shoulder: Secondary | ICD-10-CM | POA: Insufficient documentation

## 2023-11-24 DIAGNOSIS — M19011 Primary osteoarthritis, right shoulder: Secondary | ICD-10-CM | POA: Diagnosis not present

## 2023-11-24 DIAGNOSIS — R0609 Other forms of dyspnea: Secondary | ICD-10-CM

## 2023-11-24 LAB — PULMONARY FUNCTION TEST
DL/VA % pred: 95 %
DL/VA: 4.17 ml/min/mmHg/L
DLCO unc % pred: 96 %
DLCO unc: 22.04 ml/min/mmHg
FEF 25-75 Post: 4.43 L/s
FEF 25-75 Pre: 3.72 L/s
FEF2575-%Change-Post: 19 %
FEF2575-%Pred-Post: 174 %
FEF2575-%Pred-Pre: 146 %
FEV1-%Change-Post: 1 %
FEV1-%Pred-Post: 111 %
FEV1-%Pred-Pre: 109 %
FEV1-Post: 3.28 L
FEV1-Pre: 3.23 L
FEV1FVC-%Change-Post: 2 %
FEV1FVC-%Pred-Pre: 111 %
FEV6-%Change-Post: 0 %
FEV6-%Pred-Post: 102 %
FEV6-%Pred-Pre: 103 %
FEV6-Post: 3.77 L
FEV6-Pre: 3.8 L
FEV6FVC-%Change-Post: 0 %
FEV6FVC-%Pred-Post: 105 %
FEV6FVC-%Pred-Pre: 104 %
FVC-%Change-Post: -1 %
FVC-%Pred-Post: 97 %
FVC-%Pred-Pre: 98 %
FVC-Post: 3.77 L
FVC-Pre: 3.81 L
Post FEV1/FVC ratio: 87 %
Post FEV6/FVC ratio: 100 %
Pre FEV1/FVC ratio: 85 %
Pre FEV6/FVC Ratio: 100 %
RV % pred: 145 %
RV: 2.73 L
TLC % pred: 98 %
TLC: 5.69 L

## 2023-11-24 NOTE — Patient Instructions (Signed)
 Full PFT completed today ? ?

## 2023-11-24 NOTE — Progress Notes (Signed)
 Full PFT completed today ? ?

## 2023-11-24 NOTE — Telephone Encounter (Signed)
 Spoke to pt has no sxs mentioned below no need for ed visit has appt with ortho on wed due to swelling in his hand will let us  know how he is doing when he goes to appt

## 2023-11-24 NOTE — Progress Notes (Signed)
 Subjective:    Patient ID: Benjamin Conley, male    DOB: [birth year], 58 y.o.   MRN: [id number]  Patient Care Team: Dineen Rollene MATSU, [name] as PCP - General (Family Medicine) Darliss Rogue, MD as PCP - Cardiology (Cardiology) Jacobo Evalene PARAS, MD as Consulting Physician (Oncology)  Chief Complaint  Patient presents with   COPD    Shortness of breath on exertion.     BACKGROUND/INTERVAL: Patient is a 58 year old former smoker (96-pack-year history) who presents for follow-up on the issue of stage I COPD and shortness of breath on exertion.  He was last seen here by Dr. Isaiah on 03 September 2023 after an ED visit for chest pain.  I last saw the patient on 28 October 2022.  He had been lost to follow-up until seen by Dr. Isaiah.  He had PFTs today.  HPI Discussed the use of AI scribe software for clinical note transcription with the patient, who gave verbal consent to proceed.  History of Present Illness   Benjamin Conley is a 58 year old male with COPD stage one who presents with shortness of breath.  His shortness of breath has improved since his last ER visit. Previously, he could not go outside due to difficulty breathing, especially in the heat, but this has improved with current medication.  He is currently using Breztri  and albuterol . He uses the nebulizer as needed, particularly when experiencing tightness in his chest.  He recalls undergoing a stress test and EKG during his ER visits, where his heart was evaluated and found to be normal despite initial concerns due to wheezing. His blood pressure was noted to be slightly high during these evaluations.      DATA 09/13/2021 PFTs:FEV1 of 2.71 L or 89% predicted, FVC of 3.62 L or 91% predicted, FEV1/FVC of 75%, no significant bronchodilator response. Lung volumes were normal. Diffusion capacity is mildly impaired. Testing is consistent with diagnosis of emphysema, very mild in nature. 05/02/2022 LDCT chest: Lung RADS 2,  benign appearance or behavior.  Mild centrilobular emphysema.  Calcified 8.8 mm nodule in the posterior left lower lobe consistent with a granuloma.  Additional scattered tiny bilateral pulmonary nodules evident.  No suspicious pulmonary nodule or mass. 05/05/2023 LDCT chest: Lung RADS 2 benign appearance or behavior.  No change in multiple pulmonary nodules that measured 5 mm or less in size.  No new pulmonary nodules noted.  Airway unremarkable. 09/23/2023 CT angio chest: Lung findings: Lungs are clear.  No focal airspace opacities or suspicious nodules.  No effusions.  Calcified granuloma noted in the left lower lobe, unchanged. 11/24/2023 PFTs: FEV1 3.23 L or 109% predicted, FVC 3.81 L or 98% predicted, FEV1/FVC 85%, lung volumes show moderate air trapping.  Diffusion capacity normal.  No bronchodilator response.  Overall within normal limits with the exception of isolated finding of moderate air trapping.  Review of Systems A 10 point review of systems was performed and it is as noted above otherwise negative.   Patient Active Problem List   Diagnosis Date Noted   DOE (dyspnea on exertion) 10/10/2023   Back pain 10/07/2023   Chest tightness 09/23/2023   DKA (diabetic ketoacidosis) (HCC) 09/19/2023   COPD with acute exacerbation (HCC) 09/19/2023   Hyperglycemia 09/19/2023   AKI (acute kidney injury) 09/19/2023   Headache 09/01/2023   Toe ulcer due to DM (HCC) 09/01/2023   Tremor of both hands 08/02/2023   Cerebral ischemia 07/24/2023   Left shoulder pain 07/07/2023   Dysphagia  06/12/2023   History of medication noncompliance 06/12/2023   Open toe wound 10/30/2022   PAD (peripheral artery disease) 07/19/2022   Anemia 05/15/2022   Photophobia of both eyes 02/20/2022   Bilateral hand pain 08/13/2021   Dizziness 06/11/2021   Sinus congestion 06/11/2021   Pulmonary emphysema (HCC) 05/07/2021   GERD (gastroesophageal reflux disease) 01/12/2021   Screening for blood or protein in urine  06/16/2020   Contact dermatitis 06/16/2020   Leukocytosis 01/30/2020   Nicotine  use disorder 01/27/2020   Slow transit constipation 01/27/2020   Nail avulsion of toe, initial encounter 12/09/2019   Benign prostatic hyperplasia with hesitancy 10/30/2019   Abnormal ejaculation 10/21/2019   B12 deficiency 10/21/2019   Rectal pressure 10/21/2019   Thrombosis of splenic artery (HCC) 10/03/2019   Abnormal brain MRI 09/22/2019   Difficulty sleeping 09/22/2019   Tinnitus of both ears 09/22/2019   Pain due to onychomycosis of toenails of both feet 09/06/2019   Coagulation disorder 09/06/2019   Iron  deficiency anemia due to chronic blood loss 08/29/2019   Thrombocytosis 08/24/2019   Angiodysplasia of intestinal tract    Black stool 08/22/2019   Presence of arterial stent - splenic artery 08/21/2019   Orthostatic hypotension 08/19/2019   Tachycardia 08/19/2019   Chest pain 08/19/2019   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   COPD with chronic bronchitis (HCC) 08/01/2019   Tobacco abuse 08/01/2019   Diabetes mellitus without complication (HCC)    Hyperlipidemia    Impingement syndrome of shoulder region 03/30/2018   Hypertension 11/10/2013    Social History   Tobacco Use   Smoking status: Former    Current packs/day: 0.00    Average packs/day: 2.0 packs/day for 48.0 years (96.0 ttl pk-yrs)    Types: Cigarettes    Start date: 02/12/1972    Quit date: 02/12/2020    Years since quitting: 3.8   Smokeless tobacco: Never  Substance Use Topics   Alcohol use: No    Comment: Never been a problem    Allergies  Allergen Reactions   Wasp Venom Anaphylaxis   Wasp Venom Protein Anaphylaxis   Coffee Flavoring Agent (Non-Screening) Nausea And Vomiting    Patient states coffee gives him nausea and stomach cramps   Onion Nausea And Vomiting and Nausea Only   Prednisone  Other (See Comments)    DKA after IV solu medrol  following by oral prednionse  prednisone     Current Meds  Medication Sig    albuterol  (VENTOLIN  HFA) 108 (90 Base) MCG/ACT inhaler Inhale 2 puffs into the lungs every 6 (six) hours as needed for wheezing or shortness of breath.   aspirin  81 MG EC tablet Take 1 tablet (81 mg total) by mouth daily.   atorvastatin  (LIPITOR) 40 MG tablet Take 20 mg by mouth daily.   blood glucose meter kit and supplies Dispense based on patient and insurance preference. Use up to four times daily as directed. (FOR ICD-10 E10.9, E11.9).   budesonide -glycopyrrolate -formoterol  (BREZTRI ) 160-9-4.8 MCG/ACT AERO inhaler Inhale 2 puffs into the lungs 2 (two) times daily.   clopidogrel  (PLAVIX ) 75 MG tablet Take 1 tablet by mouth once daily   cyanocobalamin  (VITAMIN B12) 1000 MCG tablet Take 1,000 mcg by mouth daily.   digoxin  (LANOXIN ) 0.25 MG tablet Take 1 tablet (0.25 mg total) by mouth daily.   empagliflozin  (JARDIANCE ) 25 MG TABS tablet Take 1 tablet (25 mg total) by mouth daily before breakfast.   Ensifentrine  (OHTUVAYRE ) 3 MG/2.5ML SUSP Inhale 2.5 mLs into the lungs 2 (two) times daily.  famotidine  (PEPCID ) 20 MG tablet Take 1 tablet (20 mg total) by mouth at bedtime.   glimepiride  (AMARYL ) 1 MG tablet Take 1 tablet (1 mg total) by mouth daily with breakfast.   ipratropium-albuterol  (DUONEB) 0.5-2.5 (3) MG/3ML SOLN Take 3 mLs by nebulization every 6 (six) hours.   midodrine  (PROAMATINE ) 10 MG tablet Take 10 mg by mouth in the morning.   silodosin  (RAPAFLO ) 8 MG CAPS capsule TAKE 1 CAPSULE BY MOUTH ONCE DAILY WITH BREAKFAST   triamcinolone  cream (KENALOG ) 0.1 % Apply 1 application topically 2 (two) times daily.   [DISCONTINUED] Continuous Glucose Sensor (FREESTYLE LIBRE 3 PLUS SENSOR) MISC Change sensor every 15 days.   [DISCONTINUED] Continuous Glucose Sensor (FREESTYLE LIBRE 3 SENSOR) MISC USE ONE SENSOR EVERY 14 DAYS TO CHECK GLUCOSE CONTINOUSLY   [DISCONTINUED] Semaglutide , 2 MG/DOSE, (OZEMPIC , 2 MG/DOSE,) 8 MG/3ML SOPN INJECT 2 MG SUBCUTANEOUSLY ONCE A WEEK    Immunization History   Administered Date(s) Administered   Td 03/11/2004   Tdap 08/02/2014        Objective:     BP 130/86   Pulse 86   Temp 97.6 F (36.4 C) (Temporal)   Ht 5' 4 (1.626 m)   Wt 161 lb 3.2 oz (73.1 kg)   SpO2 100%   BMI 27.67 kg/m   SpO2: 100 %  GENERAL: Disheveled appearing gentleman, no acute distress, ambulatory with assistance of a cane.  He looks much older than stated age.  No conversational dyspnea. HEAD: Normocephalic, atraumatic.  EYES: Pupils equal, round, reactive to light.  No scleral icterus.  MOUTH: Nose/mouth/throat not examined due to masking requirements for COVID 19. NECK: Supple. No thyromegaly. Trachea midline. No JVD.  No adenopathy. PULMONARY: Good air entry bilaterally.  Coarse, otherwise, no adventitious sounds. CARDIOVASCULAR: S1 and S2. Regular rate and rhythm.  No rubs, murmurs or gallops heard. ABDOMEN: Benign. MUSCULOSKELETAL: No joint deformity, no clubbing, no edema.  NEUROLOGIC: No overt focal deficit, gait is unsteady and requires cane for ambulation.  Speech is fluent though he does have some occasional word finding difficulty, states this is chronic. SKIN: Intact,warm,dry. PSYCH: Mood and behavior normal.   Recent Results (from the past 2160 hours)  Pulmonary Function Test     Status: None   Collection Time: 11/24/23  8:34 AM  Result Value Ref Range   FVC-Pre 3.81 L   FVC-%Pred-Pre 98 %   FVC-Post 3.77 L   FVC-%Pred-Post 97 %   FVC-%Change-Post -1 %   FEV1-Pre 3.23 L   FEV1-%Pred-Pre 109 %   FEV1-Post 3.28 L   FEV1-%Pred-Post 111 %   FEV1-%Change-Post 1 %   FEV6-Pre 3.80 L   FEV6-%Pred-Pre 103 %   FEV6-Post 3.77 L   FEV6-%Pred-Post 102 %   FEV6-%Change-Post 0 %   Pre FEV1/FVC ratio 85 %   FEV1FVC-%Pred-Pre 111 %   Post FEV1/FVC ratio 87 %   FEV1FVC-%Change-Post 2 %   Pre FEV6/FVC Ratio 100 %   FEV6FVC-%Pred-Pre 104 %   Post FEV6/FVC ratio 100 %   FEV6FVC-%Pred-Post 105 %   FEV6FVC-%Change-Post 0 %   FEF 25-75 Pre 3.72 L/sec    FEF2575-%Pred-Pre 146 %   FEF 25-75 Post 4.43 L/sec   FEF2575-%Pred-Post 174 %   FEF2575-%Change-Post 19 %   RV 2.73 L   RV % pred 145 %   TLC 5.69 L   TLC % pred 98 %   DLCO unc 22.04 ml/min/mmHg   DLCO unc % pred 96 %   DL/VA 5.82 ml/min/mmHg/L  DL/VA % pred 95 %  *PFTs discussed with patient.       Assessment & Plan:     ICD-10-CM   1. COPD, mild (HCC)  J44.9     2. DOE (dyspnea on exertion)  R06.09 ECHOCARDIOGRAM COMPLETE    3. Former heavy cigarette smoker (20-39 per day)  Z87.891       Orders Placed This Encounter  Procedures   ECHOCARDIOGRAM COMPLETE    Standing Status:   Future    Expiration Date:   11/23/2024    Where should this test be performed:   [facility] [employer]    Perflutren DEFINITY (image enhancing agent) should be administered unless hypersensitivity or allergy exist:   Administer Perflutren    Reason for exam-Echo:   Dyspnea  R06.00   Discussion:    Chronic obstructive pulmonary disease (COPD), mild with minimal emphysema COPD is mild with minimal emphysema. Lung function tests show normal results with moderate air trapping and minimal to no obstruction. Symptoms of dyspnea have improved since the last ER visit. Current medications include Breztri  and albuterol , which are providing some relief. - Continue Breztri . - Use albuterol  nebulizer as needed for acute symptoms. - Patient's degree of dyspnea not explained by PFTs.  Dyspnea, evaluating for potential cardiac etiology Dyspnea has improved but was previously exacerbated by heat and exertion. Previous cardiac evaluations included stress test and EKG, but no echocardiogram was performed. There is a need to evaluate for potential cardiac causes of dyspnea, particularly concerning the artery from the heart to the lungs. - Order echocardiogram to evaluate cardiac function.     Advised if symptoms do not improve or worsen, to please contact office for sooner follow up or seek emergency care.     I spent 30 minutes of dedicated to the care of this patient on the date of this encounter to include pre-visit review of records, face-to-face time with the patient discussing conditions above, post visit ordering of testing, clinical documentation with the electronic health record, making appropriate referrals as documented, and communicating necessary findings to members of the patients care team.     C. Leita Sanders, MD Advanced Bronchoscopy PCCM [facility]    *This note was generated using voice recognition software/Dragon and/or AI transcription program.  Despite best efforts to proofread, errors can occur which can change the meaning. Any transcriptional errors that result from this process are unintentional and may not be fully corrected at the time of dictation.

## 2023-11-24 NOTE — Patient Instructions (Signed)
 VISIT SUMMARY:  Benjamin Conley, a 58 year old male with mild COPD, visited today due to shortness of breath. His symptoms have improved since his last ER visit, and he is currently using Breztri  and albuterol . Previous cardiac evaluations showed normal heart function, but his blood pressure was slightly high.  YOUR PLAN:  -CHRONIC OBSTRUCTIVE PULMONARY DISEASE (COPD): COPD is a chronic lung disease that makes it hard to breathe. Your condition is mild with minimal emphysema, and your lung function tests are mostly normal. Continue using Breztri  daily and use the albuterol  nebulizer as needed for acute symptoms.  -DYSPNEA: Dyspnea means shortness of breath. Your symptoms have improved but were previously worse with heat and exertion. We need to check your heart function to rule out any cardiac causes of your shortness of breath. An echocardiogram will be ordered to evaluate your heart.  INSTRUCTIONS:  Please schedule an echocardiogram to evaluate your heart function. Continue using your current medications as directed.

## 2023-11-25 ENCOUNTER — Encounter: Payer: Self-pay | Admitting: Oncology

## 2023-11-25 ENCOUNTER — Other Ambulatory Visit (HOSPITAL_COMMUNITY): Payer: Self-pay

## 2023-11-26 ENCOUNTER — Telehealth: Payer: Self-pay

## 2023-11-26 ENCOUNTER — Ambulatory Visit (INDEPENDENT_AMBULATORY_CARE_PROVIDER_SITE_OTHER)

## 2023-11-26 DIAGNOSIS — M25512 Pain in left shoulder: Secondary | ICD-10-CM

## 2023-11-26 DIAGNOSIS — M7522 Bicipital tendinitis, left shoulder: Secondary | ICD-10-CM | POA: Diagnosis not present

## 2023-11-26 DIAGNOSIS — M7521 Bicipital tendinitis, right shoulder: Secondary | ICD-10-CM | POA: Diagnosis not present

## 2023-11-26 DIAGNOSIS — M7541 Impingement syndrome of right shoulder: Secondary | ICD-10-CM | POA: Diagnosis not present

## 2023-11-26 DIAGNOSIS — M7542 Impingement syndrome of left shoulder: Secondary | ICD-10-CM

## 2023-11-26 DIAGNOSIS — M25511 Pain in right shoulder: Secondary | ICD-10-CM | POA: Diagnosis not present

## 2023-11-26 MED ORDER — DEXCOM G7 SENSOR MISC: 2 refills | Status: AC

## 2023-11-26 NOTE — Progress Notes (Signed)
 Orthopaedic Surgery New Patient Visit   History of Present Illness: The patient is a 58 y.o. male seen in clinic for bilateral shoulder pain.  Patient reports he has had bilateral shoulder pain for a number of years.  However he has had a series of falls over the last month including 1 out of a tub where he injured his right shoulder, neck and back.  He states this fall has exacerbated his underlying shoulder pain in general.  Pain is fairly frequent in nature.  He reports symptoms in both shoulders are better when he is resting his arms.  Pain is made worse with lifting or drawing his bow.  Associated symptoms include subjective weakness and stiffness in both arms.  He reports the pain does not wake him up at night.  Denies numbness or tingling into the hands bilaterally.  In terms of treatment, patient has tried some ice and heat without much relief.  He does report having been seen in the past for his shoulders.  He has received steroid injections and advanced imaging and thinks that he was diagnosed with partial rotator cuff tears.  He does note that he is diabetic and has significant adverse reactions to steroid injections based on previous experience.  At baseline, patient is using a cane for ambulation.  He enjoys hunting and fishing as activities.  He reports that he is retired.     Past Medical, Social and Family History: Past Medical History:  Diagnosis Date   Acute posthemorrhagic anemia    Allergy 2015   Arthritis    Asthma 2008   Carotid artery stenosis    bilateral   Complication of anesthesia    c/o difficulty breathing after anesthesia   COPD (chronic obstructive pulmonary disease) (HCC)    Coronary artery disease    Diabetes mellitus without complication (HCC)    GERD (gastroesophageal reflux disease)    Hyperlipemia    Hypertension    Melena 08/21/2019   Paroxysmal supraventricular tachycardia (HCC) 08/19/2019   Postural dizziness with presyncope 08/21/2019    Renal disorder    early stage CKD   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   Tobacco abuse    Ulcer    Upper GI bleed 08/21/2019   Past Surgical History:  Procedure Laterality Date   BACK SURGERY     lumbar   COLONOSCOPY WITH PROPOFOL  N/A 08/24/2019   Procedure: COLONOSCOPY WITH PROPOFOL ;  Surgeon: Jinny Carmine, MD;  Location: [facility] ENDOSCOPY;  Service: Endoscopy;  Laterality: N/A;   ESOPHAGOGASTRODUODENOSCOPY (EGD) WITH PROPOFOL  N/A 08/24/2019   Procedure: ESOPHAGOGASTRODUODENOSCOPY (EGD) WITH PROPOFOL ;  Surgeon: Jinny Carmine, MD;  Location: [facility] ENDOSCOPY;  Service: Endoscopy;  Laterality: N/A;   SPINE SURGERY  2008   STENT PLACEMENT VASCULAR ([facility] HX)  07/2019   stenosis of distal splenic artery and stent placed   VISCERAL ANGIOGRAPHY N/A 08/06/2019   Procedure: VISCERAL ANGIOGRAPHY;  Surgeon: Jama Cordella MATSU, MD;  Location: [facility] INVASIVE CV LAB;  Service: Cardiovascular;  Laterality: N/A;   Allergies  Allergen Reactions   Wasp Venom Anaphylaxis   Wasp Venom Protein Anaphylaxis   Coffee Flavoring Agent (Non-Screening) Nausea And Vomiting    Patient states coffee gives him nausea and stomach cramps   Onion Nausea And Vomiting and Nausea Only   Prednisone  Other (See Comments)    DKA after IV solu medrol  following by oral prednionse  prednisone    Current Outpatient Medications on File Prior to Visit  Medication Sig Dispense Refill   albuterol  (VENTOLIN  HFA) 108 (90  Base) MCG/ACT inhaler Inhale 2 puffs into the lungs every 6 (six) hours as needed for wheezing or shortness of breath. 8 g 2   aspirin  81 MG EC tablet Take 1 tablet (81 mg total) by mouth daily. 90 tablet 3   atorvastatin  (LIPITOR) 40 MG tablet Take 20 mg by mouth daily.     blood glucose meter kit and supplies Dispense based on patient and insurance preference. Use up to four times daily as directed. (FOR ICD-10 E10.9, E11.9). 1 each 0   budesonide -glycopyrrolate -formoterol  (BREZTRI ) 160-9-4.8 MCG/ACT AERO inhaler Inhale 2  puffs into the lungs 2 (two) times daily. 10.7 g 1   clopidogrel  (PLAVIX ) 75 MG tablet Take 1 tablet by mouth once daily 90 tablet 0   Continuous Glucose Sensor (FREESTYLE LIBRE 3 PLUS SENSOR) MISC Change sensor every 15 days. 2 each 3   Continuous Glucose Sensor (FREESTYLE LIBRE 3 SENSOR) MISC USE ONE SENSOR EVERY 14 DAYS TO CHECK GLUCOSE CONTINOUSLY 2 each 0   cyanocobalamin  (VITAMIN B12) 1000 MCG tablet Take 1,000 mcg by mouth daily.     digoxin  (LANOXIN ) 0.25 MG tablet Take 1 tablet (0.25 mg total) by mouth daily. 30 tablet 3   empagliflozin  (JARDIANCE ) 25 MG TABS tablet Take 1 tablet (25 mg total) by mouth daily before breakfast. 90 tablet 3   Ensifentrine  (OHTUVAYRE ) 3 MG/2.5ML SUSP Inhale 2.5 mLs into the lungs 2 (two) times daily.     famotidine  (PEPCID ) 20 MG tablet Take 1 tablet (20 mg total) by mouth at bedtime. 90 tablet 1   glimepiride  (AMARYL ) 1 MG tablet Take 1 tablet (1 mg total) by mouth daily with breakfast. 10 tablet 0   ipratropium-albuterol  (DUONEB) 0.5-2.5 (3) MG/3ML SOLN Take 3 mLs by nebulization every 6 (six) hours. 360 mL 1   midodrine  (PROAMATINE ) 10 MG tablet Take 10 mg by mouth in the morning.     Semaglutide , 2 MG/DOSE, (OZEMPIC , 2 MG/DOSE,) 8 MG/3ML SOPN INJECT 2 MG SUBCUTANEOUSLY ONCE A WEEK 9 mL 0   silodosin  (RAPAFLO ) 8 MG CAPS capsule TAKE 1 CAPSULE BY MOUTH ONCE DAILY WITH BREAKFAST 30 capsule 0   triamcinolone  cream (KENALOG ) 0.1 % Apply 1 application topically 2 (two) times daily. 30 g 0   No current facility-administered medications on file prior to visit.   Social History   Tobacco Use   Smoking status: Former    Current packs/day: 0.00    Average packs/day: 2.0 packs/day for 48.0 years (96.0 ttl pk-yrs)    Types: Cigarettes    Start date: 02/12/1972    Quit date: 02/12/2020    Years since quitting: 3.7   Smokeless tobacco: Never  Vaping Use   Vaping status: Never Used  Substance Use Topics   Alcohol use: No    Comment: Never been a problem    Drug use: No      I have reviewed past medical, surgical, social and family history, medications and allergies as documented in the EMR.  Review of Systems - A ROS was performed including pertinent positives and negatives as documented in the HPI.     Physical Exam:  General/Constitutional: NAD Vascular: No edema, swelling or tenderness, except as noted in detailed exam Integumentary: No impressive skin lesions present, except as noted in detailed exam Neuro/Psych: Normal mood and affect, oriented to person, place and time Musculoskeletal: Normal, except as noted in detailed exam and in HPI   Bilateral Shoulder focused exam:    RIGHT LEFT  Scapula Atrophy none none  Winging none none  Rotator cuff Supraspinatus 5/5 5/5   Infraspinatus 5/5 5/5   Subscapularis 5/5 5/5  AROM/PROM (degrees) FF 0-95 / 0-160 0-95 / 0-160   ER0 0-60 / 0-60 0-60 / 0-60   IR(back) T6 / T6 T6 / T6  Palpation (pain): AC negative negative   Biceps positive positive   Coracoid negative negative  Special Tests: O'Briens positive positive   Mayo-shear negative negative   Cross body Adduction negative negative   Speeds  positive positive   Jobe's negative negative   Neer positive positive   Hawkins positive positive   Belly Press negative negative  Instability: Apprehension & relocation Not tested Not tested  Other: Lateral deltoid 5/5 5/5    Vascular/Lymphatic: Fingers warm and well perfused with 2+ radial pulse.    Neurologic: Sensation intact to the Median, Ulnar and Radial nerve distribution of the hand. Sensation intact to lateral deltoid (axillary nerve).         XR Shoulder Imaging: X-rays of the bilateral shoulders, 3 views including Grashey, scapular Y and axillary were obtained at [facility] facility on 11/24/2023 were available for my review.  Per my interpretation, there are no fractures or dislocations bilaterally.  Right shoulder demonstrates AC joint degenerative changes as well as  mild degenerative changes about the inferior humeral head and glenoid.  Left shoulder demonstrates possible old [facility] joint injury with diastases and superior elevation of the distal end of the clavicle.  Mild downward sloping acromion.  Mild degenerative changes noted about the inferior aspect of the glenoid.   Assessment:  Bilateral shoulder biceps tendinitis, impingement syndrome Diabetes mellitus  Plan:  Patient was seen and examined in office today. We reviewed patient's history, examination, and imaging in detail. Based on information available for this encounter, patient likely symptomatic from bilateral shoulder biceps tendinitis and impingement of the rotator cuff tendons.  His exam was reassuring as he had great strength with testing of the rotator cuff muscles.  However I believe his motion is limited secondary to pain.  We did discuss treatment options today with the patient specifically conservative measures.  Due to patient's medical comorbidities, he is unable to tolerate steroid injections or anti-inflammatory medication.  We did discuss other treatment modalities including guided physical therapy.  We believe he would benefit from stretching, strengthening of the rotator cuff as well as other modalities deemed appropriate. Will see him back after physical therapy.  Patient also noting ongoing neck and back issues.  We will place a referral with neurosurgery for evaluation of these issues.   All questions, concerns and comments were addressed to the best of my ability.  Follow-up: 6 weeks   Arlyss GEANNIE Schneider, DO Orthopedic Surgery & Sports Medicine [place]   This document was dictated using Dragon voice recognition software. A reasonable attempt at proof reading has been made to minimize errors.

## 2023-11-26 NOTE — Telephone Encounter (Signed)
 Noted

## 2023-11-26 NOTE — Telephone Encounter (Signed)
 PA has been denied as pt is not currently insulin  dependent or on any insulin  therapies. Thank you

## 2023-11-26 NOTE — Telephone Encounter (Signed)
 Pharmacy Patient Advocate Encounter   Received notification from Physician's Office that prior authorization for FreeStyle Libre 3 Plus Sensor is required/requested.   Insurance verification completed.   The patient is insured through [facility] .   Per test claim: PA required and submitted KEY/EOC/Request #: [id number].  Full denial letter will be uploaded to the media tab. See denial reason below. Continuous glucose monitor system (receiver, transmitter, and sensor) is denied for not meeting the prior authorization requirement(s). Product authorization requires the following: (1) Submission of medical records (for example: chart notes, laboratory values) or claims history documenting one of the following: (A) You are being treated with insulin . (B) You have a history of problematic hypoglycemia with documentation of recurrent level 2 hypoglycemic events [glucose less than 54mg /dl (3.0 mmol/L)] that persist despite multiple attempts to adjust medication(s) and/or modify the diabetes treatment plan. (C) You have a history of problematic hypoglycemia with documentation of a history of a level 3 hypoglycemic event [glucose less than 54mg /dl (3.0 mmol/L)] characterized by altered mental and/or physical state requiring third-party assistance for treatment of hypoglycemia. Reviewed by: R.Ph.. Please note: This review applies to Dexcom G6, Dexcom G7, Freestyle [place], [address] [place] [address], [address] [place] [address], [place] 14, Guardian 3 and 4. The submitted information does not show that you meet the above criteria.

## 2023-11-27 ENCOUNTER — Encounter: Payer: Self-pay | Admitting: Cardiology

## 2023-11-28 ENCOUNTER — Telehealth: Payer: Self-pay

## 2023-11-28 NOTE — Telephone Encounter (Signed)
 Copied from CRM #[id number]. Topic: Clinical - Medical Advice >> Nov 28, 2023  3:44 PM Anairis L wrote: Reason for CRM: Mr.Walbert calling to info us  that a prior authorization from [facility] is required to have written down  Brand tier medically necessaries.  Continuous Glucose Sensor (DEXCOM G7 SENSOR) MISC  Any questions please give patient a call.  Routing to prior [employer] team

## 2023-12-01 ENCOUNTER — Other Ambulatory Visit (HOSPITAL_COMMUNITY): Payer: Self-pay

## 2023-12-02 NOTE — Telephone Encounter (Signed)
 Per patient's insurance this prior shara was closed. It looks like Ayla completed a prior auth for [employer] recently and the denial stands for Dexcom as well. Please see her encounter from 11/26/2023.

## 2023-12-08 ENCOUNTER — Other Ambulatory Visit: Payer: Self-pay | Admitting: Family

## 2023-12-08 DIAGNOSIS — E119 Type 2 diabetes mellitus without complications: Secondary | ICD-10-CM

## 2023-12-10 DIAGNOSIS — M25512 Pain in left shoulder: Secondary | ICD-10-CM | POA: Diagnosis not present

## 2023-12-10 DIAGNOSIS — M25511 Pain in right shoulder: Secondary | ICD-10-CM | POA: Diagnosis not present

## 2023-12-10 MED ORDER — BLOOD GLUCOSE MONITORING SUPPL DEVI: 1.0000 | Freq: Three times a day (TID) | 0 refills | Status: AC

## 2023-12-10 MED ORDER — LANCET DEVICE MISC: 1.0000 | Freq: Three times a day (TID) | 0 refills | Status: AC

## 2023-12-10 MED ORDER — BLOOD GLUCOSE TEST VI STRP: 1.0000 | ORAL_STRIP | Freq: Three times a day (TID) | 0 refills | Status: DC

## 2023-12-10 MED ORDER — LANCETS MISC. MISC: 1.0000 | Freq: Three times a day (TID) | 0 refills | Status: AC

## 2023-12-10 NOTE — Addendum Note (Signed)
 Addended by: Eliud Polo on: 12/10/2023 03:19 PM   Modules accepted: Orders

## 2023-12-12 ENCOUNTER — Encounter: Payer: Self-pay | Admitting: Family

## 2023-12-12 DIAGNOSIS — M25512 Pain in left shoulder: Secondary | ICD-10-CM | POA: Diagnosis not present

## 2023-12-12 DIAGNOSIS — M25511 Pain in right shoulder: Secondary | ICD-10-CM | POA: Diagnosis not present

## 2023-12-16 NOTE — Progress Notes (Signed)
 Referring Physician:  Gust Molly, DO [address] [place],  [place] [address]  Primary Physician:  Dineen Rollene MATSU, [name]  History of Present Illness: 12/22/2023 Benjamin Conley has a history of HTN, PAD, COPD, emphysema, GERD, dysphagia, DM, BPH, hyperlipidemia, iron  deficiency anemia.   History of L4-L5 fusion.   Seen by ortho on 11/26/23 for bilateral shoulder pain and he was sent to PT.   He has 2 month history of intermittent neck pain that radiates into his shoulder blades. Thinks pain started after 2 recent falls. Pain is worse with any movement. As above, he has constant bilateral shoulder pain with burning in his biceps region. He has numbness and tingling in both arms to his hands that is more constant. He feels like he has weakness due to his pain. Pain is better with rest.   He notes trigger fingers in both hands.   He cannot have steroid injections due to elevated blood sugars post injection. Per patient, he is unable to take NSAIDs.   He is on PLAVIX .   Tobacco use: he quit in 2021  Bowel/Bladder Dysfunction: none  Conservative measures:  Physical therapy: Has participated at [facility] in [place] for shoulder pain not back or neck issues Multimodal medical therapy including regular antiinflammatories: Tylenol   Injections:   Lumbar epidural steroid injections per patient with [facility]-not sure what year  Past Surgery:  2009--Fusion L4-L5 at [facility] has no symptoms of cervical myelopathy.  The symptoms are causing a significant impact on the patient's life.   Review of Systems:  A 10 point review of systems is negative, except for the pertinent positives and negatives detailed in the HPI.  Past Medical History: Past Medical History:  Diagnosis Date   Acute posthemorrhagic anemia    Allergy 2015   Arthritis    Asthma 2008   Carotid artery stenosis    bilateral   Complication of anesthesia    c/o difficulty breathing  after anesthesia   COPD (chronic obstructive pulmonary disease) (HCC)    Coronary artery disease    Diabetes mellitus without complication (HCC)    GERD (gastroesophageal reflux disease)    Hyperlipemia    Hypertension    Melena 08/21/2019   Paroxysmal supraventricular tachycardia 08/19/2019   Postural dizziness with presyncope 08/21/2019   Renal disorder    early stage CKD   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   Tobacco abuse    Ulcer    Upper GI bleed 08/21/2019    Past Surgical History: Past Surgical History:  Procedure Laterality Date   BACK SURGERY     lumbar   COLONOSCOPY WITH PROPOFOL  N/A 08/24/2019   Procedure: COLONOSCOPY WITH PROPOFOL ;  Surgeon: Jinny Carmine, MD;  Location: [facility] ENDOSCOPY;  Service: Endoscopy;  Laterality: N/A;   ESOPHAGOGASTRODUODENOSCOPY (EGD) WITH PROPOFOL  N/A 08/24/2019   Procedure: ESOPHAGOGASTRODUODENOSCOPY (EGD) WITH PROPOFOL ;  Surgeon: Jinny Carmine, MD;  Location: [facility] ENDOSCOPY;  Service: Endoscopy;  Laterality: N/A;   SPINE SURGERY  2008   STENT PLACEMENT VASCULAR ([facility] HX)  07/2019   stenosis of distal splenic artery and stent placed   VISCERAL ANGIOGRAPHY N/A 08/06/2019   Procedure: VISCERAL ANGIOGRAPHY;  Surgeon: Jama Cordella MATSU, MD;  Location: [facility] INVASIVE CV LAB;  Service: Cardiovascular;  Laterality: N/A;    Allergies: Allergies as of 12/22/2023 - Review Complete 12/22/2023  Allergen Reaction Noted   Wasp venom Anaphylaxis 11/02/2014   Wasp venom protein Anaphylaxis 11/25/2014   Coffee flavoring agent (non-screening) Nausea And  Vomiting 08/01/2019   Onion Nausea And Vomiting and Nausea Only 11/16/2015   Prednisone  Other (See Comments) 09/23/2023    Medications: Outpatient Encounter Medications as of 12/22/2023  Medication Sig   acetaminophen  (TYLENOL ) 500 MG tablet Take 2 tablets (1,000 mg total) by mouth 3 (three) times daily.   albuterol  (VENTOLIN  HFA) 108 (90 Base) MCG/ACT inhaler Inhale 2 puffs into the lungs every 6 (six)  hours as needed for wheezing or shortness of breath.   aspirin  81 MG EC tablet Take 1 tablet (81 mg total) by mouth daily.   atorvastatin  (LIPITOR) 40 MG tablet Take 20 mg by mouth daily.   blood glucose meter kit and supplies Dispense based on patient and insurance preference. Use up to four times daily as directed. (FOR ICD-10 E10.9, E11.9).   Blood Glucose Monitoring Suppl DEVI 1 each by Does not apply route in the morning, at noon, and at bedtime. May substitute to any manufacturer covered by patient's insurance.   budesonide -glycopyrrolate -formoterol  (BREZTRI ) 160-9-4.8 MCG/ACT AERO inhaler Inhale 2 puffs into the lungs 2 (two) times daily.   clopidogrel  (PLAVIX ) 75 MG tablet Take 1 tablet by mouth once daily   Continuous Glucose Sensor (DEXCOM G7 SENSOR) MISC CHECK SUGARS CONTINUOUSLY   cyanocobalamin  (VITAMIN B12) 1000 MCG tablet Take 1,000 mcg by mouth daily.   digoxin  (LANOXIN ) 0.25 MG tablet Take 1 tablet (0.25 mg total) by mouth daily.   empagliflozin  (JARDIANCE ) 25 MG TABS tablet Take 1 tablet (25 mg total) by mouth daily before breakfast.   Ensifentrine  (OHTUVAYRE ) 3 MG/2.5ML SUSP Inhale 2.5 mLs into the lungs 2 (two) times daily.   famotidine  (PEPCID ) 20 MG tablet Take 1 tablet (20 mg total) by mouth at bedtime.   Glucose Blood (BLOOD GLUCOSE TEST STRIPS) STRP 1 each by Does not apply route in the morning, at noon, and at bedtime. May substitute to any manufacturer covered by patient's insurance.   ipratropium-albuterol  (DUONEB) 0.5-2.5 (3) MG/3ML SOLN Take 3 mLs by nebulization every 6 (six) hours.   Lancet Device MISC 1 each by Does not apply route in the morning, at noon, and at bedtime. May substitute to any manufacturer covered by patient's insurance.   Lancets Misc. MISC 1 each by Does not apply route in the morning, at noon, and at bedtime. May substitute to any manufacturer covered by patient's insurance.   midodrine  (PROAMATINE ) 10 MG tablet Take 10 mg by mouth in the morning.    Semaglutide , 2 MG/DOSE, (OZEMPIC , 2 MG/DOSE,) 8 MG/3ML SOPN INJECT 2MG  SUBCUTANEOUSLY ONCE WEEKLY   silodosin  (RAPAFLO ) 8 MG CAPS capsule TAKE 1 CAPSULE BY MOUTH ONCE DAILY WITH BREAKFAST   triamcinolone  cream (KENALOG ) 0.1 % Apply 1 application topically 2 (two) times daily.   [DISCONTINUED] glimepiride  (AMARYL ) 1 MG tablet Take 1 tablet (1 mg total) by mouth daily with breakfast.   No facility-administered encounter medications on file as of 12/22/2023.    Social History: Social History   Tobacco Use   Smoking status: Former    Current packs/day: 0.00    Average packs/day: 2.0 packs/day for 48.0 years (96.0 ttl pk-yrs)    Types: Cigarettes    Start date: 02/12/1972    Quit date: 02/12/2020    Years since quitting: 3.8   Smokeless tobacco: Never  Vaping Use   Vaping status: Never Used  Substance Use Topics   Alcohol use: No    Comment: Never been a problem   Drug use: No    Family Medical History: Family History  Problem Relation Age of Onset   Diabetes Mother    Diabetes Mellitus II Mother    Obesity Mother    Tuberculosis Father    Heart disease Father    Diabetes Father    Diabetes Sister    Diabetes Sister    Diabetes Sister    Diabetes Sister    Diabetes Brother    Diabetes Mellitus II Brother    Hyperlipidemia Brother    Obesity Brother    Cancer Maternal Aunt    Colon cancer Maternal Uncle    Obesity Sister    Obesity Sister    Obesity Sister    Obesity Sister    Prostate cancer Neg Hx     Physical Examination: Vitals:   12/22/23 1045  BP: 116/80    General: Patient is well developed, well nourished, calm, collected, and in no apparent distress. Attention to examination is appropriate.  Respiratory: Patient is breathing without any difficulty.   NEUROLOGICAL:     Awake, alert, oriented to person, place, and time.  Speech is clear and fluent. Fund of knowledge is appropriate.   Cranial Nerves: Pupils equal round and reactive to light.   Facial tone is symmetric.    Mild lower posterior cervical tenderness. Mild tenderness in bilateral trapezial region.   Limited ROM of both shoulder with pain.   No abnormal lesions on exposed skin.   Strength: Side Biceps Triceps Deltoid Interossei Grip Wrist Ext. Wrist Flex.  R 5 5 5 5 5 5 5   L 5 5 5 5 5 5 5    Side Iliopsoas Quads Hamstring PF DF EHL  R 5 5 5 5 5 5   L 5 5 5 5 5 5    Reflexes are 2+ and symmetric at the biceps, brachioradialis, patella and achilles.   Hoffman's is absent.  Clonus is not present.   Bilateral upper and lower extremity sensation is intact to light touch.     He ambulates with cane and favors his left leg.   Medical Decision Making  Imaging: Cervical xrays dated 09/24/21:  FINDINGS: C7 difficult to visualized on lateral views. Mild cervicothoracic scoliosis concave right. C5-C6 disc degeneration and endplate osteophyte formation. No acute bony abnormality identified. No evidence of fracture. Pulmonary apices are clear.   IMPRESSION: C7 difficult to visualized on lateral views. Mild cervicothoracic spine scoliosis concave right. C5-C6 degenerative change. No acute bony abnormality identified.     Electronically Signed   By: Debby  Register M.D.   On: 09/25/2021 09:51  Assessment and Plan: Mr. Kaufhold has a 2 month history of intermittent neck pain that radiates into his shoulder blades. As above, he also has constant bilateral shoulder pain with burning in his biceps region. He has numbness and tingling in both arms to his hands that is more constant. He feels like he has weakness due to his pain.   Previous cervical imaging from 2023 showed cervical spondylosis and DDD.   A lot of his pain is likely shoulder mediated with painful/limited ROM of both shoulders.   Treatment options discussed with patient and following plan made:   - MRI of cervical spine to further evaluate his neck and arm pain.  - Depending on results of MRI, may add  PT orders for cervical spine.  - Per patient, he unable to have injections due to DM and he is unable to do NSAIDs. Per chart, he has history of DKA after oral prednisone  and IV solumedrol.  - He is on PLAVIX .  -  Will schedule MyChart visit to review MRI results once I get them back.   I spent a total of 30 minutes in face-to-face and non-face-to-face activities related to this patient's care today including review of outside records, review of imaging, review of symptoms, physical exam, discussion of differential diagnosis, discussion of treatment options, and documentation.   Thank you for involving me in the care of this patient.   Glade Boys PA-C Dept. of Neurosurgery

## 2023-12-17 DIAGNOSIS — M25511 Pain in right shoulder: Secondary | ICD-10-CM | POA: Diagnosis not present

## 2023-12-17 DIAGNOSIS — M25512 Pain in left shoulder: Secondary | ICD-10-CM | POA: Diagnosis not present

## 2023-12-19 ENCOUNTER — Ambulatory Visit: Admitting: Family

## 2023-12-19 ENCOUNTER — Telehealth: Payer: Self-pay

## 2023-12-19 ENCOUNTER — Encounter: Payer: Self-pay | Admitting: Family

## 2023-12-19 VITALS — BP 102/62 | HR 97 | Temp 97.7°F | Ht 64.0 in | Wt 161.0 lb

## 2023-12-19 DIAGNOSIS — G8929 Other chronic pain: Secondary | ICD-10-CM | POA: Diagnosis not present

## 2023-12-19 DIAGNOSIS — Z7984 Long term (current) use of oral hypoglycemic drugs: Secondary | ICD-10-CM | POA: Diagnosis not present

## 2023-12-19 DIAGNOSIS — M25512 Pain in left shoulder: Secondary | ICD-10-CM | POA: Diagnosis not present

## 2023-12-19 DIAGNOSIS — E119 Type 2 diabetes mellitus without complications: Secondary | ICD-10-CM | POA: Diagnosis not present

## 2023-12-19 DIAGNOSIS — Z7985 Long-term (current) use of injectable non-insulin antidiabetic drugs: Secondary | ICD-10-CM

## 2023-12-19 DIAGNOSIS — E785 Hyperlipidemia, unspecified: Secondary | ICD-10-CM | POA: Diagnosis not present

## 2023-12-19 LAB — POCT GLYCOSYLATED HEMOGLOBIN (HGB A1C): Hemoglobin A1C: 7.4 % — AB (ref 4.0–5.6)

## 2023-12-19 MED ORDER — ACETAMINOPHEN 500 MG PO TABS: 1000.0000 mg | ORAL_TABLET | Freq: Three times a day (TID) | ORAL | 3 refills | Status: DC

## 2023-12-19 NOTE — Progress Notes (Signed)
 Assessment & Plan:  Chronic left shoulder pain Assessment & Plan: Acute on chronic.  Exacerbated after fall.  Presentation raises concern for rotator cuff pathology, versus impingement.  Reviewed recent x-ray.  Continue follow-up with orthopedics as scheduled this month. Advised likely he would need an MRI shoulder. Discussed pain management ; encouraged heat, scheduling acetaminophen  1000 mg TID.  Advised to postpone physical therapy if aggravated pain.  Consider tramadol .  Orders: -     Acetaminophen ; Take 2 tablets (1,000 mg total) by mouth 3 (three) times daily.  Dispense: 540 tablet; Refill: 3  Diabetes mellitus without complication (HCC) Assessment & Plan: Chronic, uncontrolled.  Discussed goal of A1c 6.5-7.  He politely declines adding third medication diabetic regimen.  Again, discussed soda use and strongly encouraged him to abstain from soda as likely would achieve glycemic control.  He will work on dietary measures.  Continue Jardiance  25 mg daily, Ozempic  2 mg.  Orders: -     POCT glycosylated hemoglobin (Hb A1C)  Hyperlipidemia, unspecified hyperlipidemia type Assessment & Plan: Chronic, stable.  LDL 77.  Continue atorvastatin  40 mg daily.      Return precautions given.   Risks, benefits, and alternatives of the medications and treatment plan prescribed today were discussed, and patient expressed understanding.   Education regarding symptom management and diagnosis given to patient on AVS either electronically or printed.  Return in about 6 weeks (around 01/30/2024).  Rollene Northern, [name]  Subjective:    Patient ID: Benjamin Conley, male    DOB: [birth year], 58 y.o.   MRN: [id number]  CC: Benjamin Conley is a 58 y.o. male who presents today for follow up.   HPI: He complains of BL shoulder pain, left shoulder is more bothersome.  Onset after falling in bathtub x  4 weeks ago.  In the past corticosteroid injections were not effective.  In PT, he feels pain  is worse. Shoulder pain is around the entire joint.  No numbness in left arm.   Every 2 days, he drinks 2 L of soda.      He is compliant with Ozempic  2 mg, Jardiance  25 mg  No h/o seizure.   Recently seen by orthopedics 11/26/23 for BL shoulder pain, Dr Gust. Referred to PT  Follow up Dr Tamea 11/24/23 Continued on Breztri  and albuterol . Ordered echocardiogram ( scheduled)   Right shoulder xray  11/29/23 Moderate degenerative disease of the right AC joint and mild glenohumeral degenerative disease. Left shoulder xray 11/29/23  Mild degenerative disease of the left AC joint.  Allergies: Wasp venom, Wasp venom protein, Coffee flavoring agent (non-screening), Onion, and Prednisone  Current Outpatient Medications on File Prior to Visit  Medication Sig Dispense Refill   albuterol  (VENTOLIN  HFA) 108 (90 Base) MCG/ACT inhaler Inhale 2 puffs into the lungs every 6 (six) hours as needed for wheezing or shortness of breath. 8 g 2   aspirin  81 MG EC tablet Take 1 tablet (81 mg total) by mouth daily. 90 tablet 3   atorvastatin  (LIPITOR) 40 MG tablet Take 20 mg by mouth daily.     blood glucose meter kit and supplies Dispense based on patient and insurance preference. Use up to four times daily as directed. (FOR ICD-10 E10.9, E11.9). 1 each 0   Blood Glucose Monitoring Suppl DEVI 1 each by Does not apply route in the morning, at noon, and at bedtime. May substitute to any manufacturer covered by patient's insurance. 1 each 0   budesonide -glycopyrrolate -formoterol  (BREZTRI ) 160-9-4.8 MCG/ACT AERO inhaler  Inhale 2 puffs into the lungs 2 (two) times daily. 10.7 g 1   clopidogrel  (PLAVIX ) 75 MG tablet Take 1 tablet by mouth once daily 90 tablet 0   Continuous Glucose Sensor (DEXCOM G7 SENSOR) MISC CHECK SUGARS CONTINUOUSLY 6 each 2   cyanocobalamin  (VITAMIN B12) 1000 MCG tablet Take 1,000 mcg by mouth daily.     digoxin  (LANOXIN ) 0.25 MG tablet Take 1 tablet (0.25 mg total) by mouth daily. 30 tablet  3   empagliflozin  (JARDIANCE ) 25 MG TABS tablet Take 1 tablet (25 mg total) by mouth daily before breakfast. 90 tablet 3   Ensifentrine  (OHTUVAYRE ) 3 MG/2.5ML SUSP Inhale 2.5 mLs into the lungs 2 (two) times daily.     famotidine  (PEPCID ) 20 MG tablet Take 1 tablet (20 mg total) by mouth at bedtime. 90 tablet 1   Glucose Blood (BLOOD GLUCOSE TEST STRIPS) STRP 1 each by Does not apply route in the morning, at noon, and at bedtime. May substitute to any manufacturer covered by patient's insurance. 100 strip 0   ipratropium-albuterol  (DUONEB) 0.5-2.5 (3) MG/3ML SOLN Take 3 mLs by nebulization every 6 (six) hours. 360 mL 1   Lancet Device MISC 1 each by Does not apply route in the morning, at noon, and at bedtime. May substitute to any manufacturer covered by patient's insurance. 1 each 0   Lancets Misc. MISC 1 each by Does not apply route in the morning, at noon, and at bedtime. May substitute to any manufacturer covered by patient's insurance. 100 each 0   midodrine  (PROAMATINE ) 10 MG tablet Take 10 mg by mouth in the morning.     Semaglutide , 2 MG/DOSE, (OZEMPIC , 2 MG/DOSE,) 8 MG/3ML SOPN INJECT 2MG  SUBCUTANEOUSLY ONCE WEEKLY 9 mL 0   silodosin  (RAPAFLO ) 8 MG CAPS capsule TAKE 1 CAPSULE BY MOUTH ONCE DAILY WITH BREAKFAST 30 capsule 0   triamcinolone  cream (KENALOG ) 0.1 % Apply 1 application topically 2 (two) times daily. 30 g 0   No current facility-administered medications on file prior to visit.    Review of Systems  Constitutional:  Negative for chills and fever.  Respiratory:  Negative for cough.   Cardiovascular:  Negative for chest pain and palpitations.  Gastrointestinal:  Negative for nausea and vomiting.  Musculoskeletal:  Positive for arthralgias.  Neurological:  Negative for numbness.      Objective:    BP 102/62   Pulse 97   Temp 97.7 F (36.5 C) (Oral)   Ht 5' 4 (1.626 m)   Wt 161 lb (73 kg)   SpO2 97%   BMI 27.64 kg/m  BP Readings from Last 3 Encounters:  12/22/23  116/80  12/19/23 102/62  11/24/23 130/86   Wt Readings from Last 3 Encounters:  12/22/23 160 lb 8 oz (72.8 kg)  12/19/23 161 lb (73 kg)  11/24/23 161 lb 3.2 oz (73.1 kg)   Lab Results  Component Value Date   HGBA1C 7.4 (A) 12/19/2023   Lab Results  Component Value Date   LDLCALC 77 06/12/2023      Physical Exam Vitals reviewed.  Constitutional:      Appearance: He is well-developed.  Cardiovascular:     Rate and Rhythm: Regular rhythm.     Heart sounds: Normal heart sounds.  Pulmonary:     Effort: Pulmonary effort is normal. No respiratory distress.     Breath sounds: Normal breath sounds. No wheezing, rhonchi or rales.  Musculoskeletal:     Right shoulder: No swelling, deformity, effusion, laceration or tenderness.  Normal range of motion. Normal strength. Normal pulse.     Left shoulder: Tenderness present. No swelling, deformity, effusion or laceration. Decreased range of motion. Normal strength. Normal pulse.     Comments: Left Shoulder:   No asymmetry of shoulders when comparing right and left.Pain with palpation over glenohumeral joint lines, AC joint.Pain with internal and external rotation. Pain with resisted lateral extension and unable to raise above level of shoulder.  Negative active painful arc sign. Negative passive arc ( Neer's). Negative drop arm.   No pain, swelling, or ecchymosis noted over long head of biceps.  Strength and sensation normal BUE's.   Skin:    General: Skin is warm and dry.  Neurological:     Mental Status: He is alert.  Psychiatric:        Speech: Speech normal.        Behavior: Behavior normal.

## 2023-12-19 NOTE — Telephone Encounter (Signed)
 Error

## 2023-12-19 NOTE — Patient Instructions (Addendum)
 Please drink less Pepsi so we can achieve an A1c closer to 7.   Please continue following with orthopedics ; as discussed you may need shoulder MRI.   Start tylenol  1000mg  three times daily.    Consider tramadol  if this is not effective.

## 2023-12-22 ENCOUNTER — Ambulatory Visit: Admitting: Orthopedic Surgery

## 2023-12-22 ENCOUNTER — Encounter: Payer: Self-pay | Admitting: Orthopedic Surgery

## 2023-12-22 VITALS — BP 116/80 | Ht 64.0 in | Wt 160.5 lb

## 2023-12-22 DIAGNOSIS — M5412 Radiculopathy, cervical region: Secondary | ICD-10-CM

## 2023-12-22 DIAGNOSIS — M5030 Other cervical disc degeneration, unspecified cervical region: Secondary | ICD-10-CM

## 2023-12-22 DIAGNOSIS — M50322 Other cervical disc degeneration at C5-C6 level: Secondary | ICD-10-CM | POA: Diagnosis not present

## 2023-12-22 DIAGNOSIS — M4722 Other spondylosis with radiculopathy, cervical region: Secondary | ICD-10-CM

## 2023-12-22 DIAGNOSIS — M47812 Spondylosis without myelopathy or radiculopathy, cervical region: Secondary | ICD-10-CM

## 2023-12-22 DIAGNOSIS — M542 Cervicalgia: Secondary | ICD-10-CM

## 2023-12-22 NOTE — Patient Instructions (Signed)
 It was so nice to see you today. Thank you so much for coming in.    I want to get an MRI of your neck to look into things further. We will get this approved through your insurance and [facility] will call you to schedule the appointment. Ask about your patient responsibility. You do not need to pay this prior to getting MRI, they can bill you.   [facility] is located at [address] in [place]. This is near the intersection of [address] and [address]   After you have the MRI, it can take 14-28 days for me to get the results back. If I don't have them in 2 weeks, we will call to try to get the results.   Once I have the results, we will call you to schedule a follow up visit with me to review them.   Please do not hesitate to call if you have any questions or concerns. You can also message me in MyChart.   Glade Boys PA-C [phone]     The physicians and staff at [facility] are committed to providing excellent care. You may receive a survey asking for feedback about your experience at our office. We value you your feedback and appreciate you taking the time to to fill it out. The [facility] leadership team is also available to discuss your experience in person, feel free to contact us  [phone].

## 2023-12-23 DIAGNOSIS — M25511 Pain in right shoulder: Secondary | ICD-10-CM | POA: Diagnosis not present

## 2023-12-23 DIAGNOSIS — M25512 Pain in left shoulder: Secondary | ICD-10-CM | POA: Diagnosis not present

## 2023-12-23 NOTE — Assessment & Plan Note (Addendum)
 Acute on chronic.  Exacerbated after fall.  Presentation raises concern for rotator cuff pathology, versus impingement.  Reviewed recent x-ray.  Continue follow-up with orthopedics as scheduled this month. Advised likely he would need an MRI shoulder. Discussed pain management ; encouraged heat, scheduling acetaminophen  1000 mg TID.  Advised to postpone physical therapy if aggravated pain.  Consider tramadol .

## 2023-12-23 NOTE — Assessment & Plan Note (Signed)
 Chronic, stable.  LDL 77.  Continue atorvastatin  40 mg daily.

## 2023-12-23 NOTE — Assessment & Plan Note (Signed)
 Chronic, uncontrolled.  Discussed goal of A1c 6.5-7.  He politely declines adding third medication diabetic regimen.  Again, discussed soda use and strongly encouraged him to abstain from soda as likely would achieve glycemic control.  He will work on dietary measures.  Continue Jardiance  25 mg daily, Ozempic  2 mg.

## 2023-12-25 ENCOUNTER — Ambulatory Visit: Admitting: Family

## 2023-12-25 ENCOUNTER — Telehealth: Payer: Self-pay

## 2023-12-25 NOTE — Telephone Encounter (Signed)
 FORM RECEIVED PLACED ON PCP DESK FOR REVIEW AND SIGNATURE

## 2023-12-25 NOTE — Telephone Encounter (Signed)
 Copied from CRM [id number]. Topic: Clinical - Medication Question >> Dec 25, 2023 11:59 AM Charolett L wrote: Reason for CRM: Nesha from [employer] is calling in reference to medication appeal for the [place]. Due to prior authorization being denied. Case# [id number] Requesting call back Shes faxing over a standard appeal form CB# [phone]

## 2023-12-26 DIAGNOSIS — M25511 Pain in right shoulder: Secondary | ICD-10-CM | POA: Diagnosis not present

## 2023-12-26 DIAGNOSIS — M25512 Pain in left shoulder: Secondary | ICD-10-CM | POA: Diagnosis not present

## 2023-12-29 ENCOUNTER — Ambulatory Visit
Admission: RE | Admit: 2023-12-29 | Discharge: 2023-12-29 | Disposition: A | Discharge status: Home / Self Care | Source: Ambulatory Visit | Attending: Orthopedic Surgery | Admitting: Orthopedic Surgery

## 2023-12-29 DIAGNOSIS — M47812 Spondylosis without myelopathy or radiculopathy, cervical region: Secondary | ICD-10-CM

## 2023-12-29 DIAGNOSIS — M5412 Radiculopathy, cervical region: Secondary | ICD-10-CM

## 2023-12-29 DIAGNOSIS — M542 Cervicalgia: Secondary | ICD-10-CM

## 2023-12-29 DIAGNOSIS — M5030 Other cervical disc degeneration, unspecified cervical region: Secondary | ICD-10-CM

## 2023-12-29 DIAGNOSIS — M4802 Spinal stenosis, cervical region: Secondary | ICD-10-CM | POA: Diagnosis not present

## 2023-12-29 DIAGNOSIS — M50222 Other cervical disc displacement at C5-C6 level: Secondary | ICD-10-CM | POA: Diagnosis not present

## 2023-12-30 DIAGNOSIS — M25512 Pain in left shoulder: Secondary | ICD-10-CM | POA: Diagnosis not present

## 2023-12-30 DIAGNOSIS — M25511 Pain in right shoulder: Secondary | ICD-10-CM | POA: Diagnosis not present

## 2024-01-01 ENCOUNTER — Encounter: Payer: Self-pay | Admitting: Family

## 2024-01-01 NOTE — Progress Notes (Unsigned)
 My Chart Video Visit- Progress Note: Referring Physician:  Dineen Rollene MATSU, [name] [address] [place],  [place] [address]  Primary Physician:  Dineen Rollene MATSU, [name]  This visit was performed via MyChart/video.   Patient location: home Provider location: working from home  I spent a total of 10 minutes non-face-to-face activities for this visit on the date of this encounter including review of current clinical condition and response to treatment.    Patient has given verbal consent to this MyChart video visit and we reviewed the limitations of a MyChart video visit. Patient wishes to proceed.    Chief Complaint:  review cervical MRI  History of Present Illness: Benjamin Conley is a 58 y.o. male has a history of HTN, PAD, COPD, emphysema, GERD, dysphagia, DM, BPH, hyperlipidemia, iron  deficiency anemia.    History of L4-L5 fusion.   Last seen by me on 12/22/23 for intermittent neck pain that radiates into his shoulder blades along with bilateral shoulder pain with burning in his biceps region. He has numbness and tingling in both arms to his hands that is more constant.   Previous cervical imaging from 2023 showed cervical spondylosis and DDD.    A lot of his pain is likely shoulder mediated with painful/limited ROM of both shoulders at last visit.   MyChart visit scheduled to review his cervical MRI.   He is about the same. He continues with intermittent neck pain that radiates into his shoulder blades. Pain is worse with any movement. He also has constant bilateral shoulder pain with burning in his biceps region. He is in PT for his shoulders and feels like the left one is locking up. He thinks he is getting more ROM with right shoulder.   He has intermittent numbness and tingling in both arms to his hands. He feels like he has weakness due to his pain. He notes trigger fingers in both hands.    He cannot have steroid injections due to elevated blood  sugars post injection. Per patient, he is unable to take NSAIDs.    He is on PLAVIX .    Tobacco use: he quit in 2021   Bowel/Bladder Dysfunction: none   Conservative measures:  Physical therapy: Has participated at [facility] in [place] for shoulder pain not back or neck issues Multimodal medical therapy including regular antiinflammatories: Tylenol   Injections:   Lumbar epidural steroid injections per patient with [facility]-not sure what year   Past Surgery:  2009--Fusion L4-L5 at [facility] has no symptoms of cervical myelopathy.   The symptoms are causing a significant impact on the patient's life.     Exam: General: Patient is well developed, well nourished, calm, collected, and in no apparent distress. Attention to examination is appropriate.  Respiratory: Patient is breathing without any difficulty.    Awake, alert, oriented to person, place, and time.  Speech is clear and fluent. Fund of knowledge is appropriate.     Imaging: Cervical MRI dated 12/29/23:  FINDINGS:   BONES AND ALIGNMENT: Normal alignment. Normal vertebral body heights. Bone marrow signal is unremarkable.   SPINAL CORD: Normal spinal cord size. No abnormal spinal cord signal.   SOFT TISSUES: No paraspinal mass.   C2-C3: No significant disc herniation. No spinal canal stenosis or neural foraminal narrowing.   C3-C4: Small central disc protrusion with moderate spinal canal stenosis indenting the ventral spinal cord. No neural foraminal narrowing.   C4-C5: Small central disc protrusion indenting the ventral spinal  cord and causing moderate spinal canal stenosis. No neural foraminal stenosis.   C5-C6: Disc bulge with left subarticular protrusion superimposed and mild bilateral uncovertebral spurring. Severe spinal canal stenosis. Moderate bilateral neural foraminal stenosis.   C6-C7: Small disc bulge with uncovertebral hypertrophy. Mild spinal canal stenosis. Moderate left  foraminal stenosis.   C7-T1: No significant disc herniation. No spinal canal stenosis or neural foraminal narrowing.   IMPRESSION: 1. C5-C6. Disc bulge with left subarticular protrusion and mild bilateral uncovertebral spurring causing severe spinal canal stenosis and moderate bilateral neural foraminal stenosis. 2. C3-C4. Small central disc protrusion with moderate spinal canal stenosis. 3. C4-C5. Small central disc protrusion with moderate spinal canal stenosis. 4. C6-C7. Small disc bulge with uncovertebral hypertrophy causing mild spinal canal stenosis and moderate left foraminal stenosis.   Electronically signed by: Franky Stanford MD 01/01/2024 02:46 AM EDT RP Workstation: [id number]  I have personally reviewed the images and agree with the above interpretation.  Assessment and Plan: Benjamin Conley has intermittent neck pain that radiates into his shoulder blades along with constant bilateral shoulder pain with burning in his biceps region. He has numbness and tingling in both arms to his hands that is more constant. He feels like he has weakness due to his pain.   No dexterity issues (unless his trigger fingers are acting up). He notes some balance issues that are chronic.   He has known cervical spondylosis with moderate central stenosis C3-C5, mild central and moderate left foraminal stenosis C6-C7, and severe central stenosis C5-C6 with moderate bilateral foraminal stenosis.    Neck pain is likely from underlying spondylosis. I think a lot of his pain is likely shoulder mediated with painful/limited ROM of both shoulders.    Treatment options discussed with patient and following plan made:   - We discussed severe central stenosis at C5-C6 along with natural history. He does not have any significant myelopathy symptoms.  - He will follow up with Dr. Claudene to discuss further options for his cervical spine.  - Will get cervical xrays prior to this visit.   - Per patient, he unable  to have injections due to DM and he is unable to do NSAIDs. Per chart, he has history of DKA after oral prednisone  and IV solumedrol.  - He is on PLAVIX .   Glade Boys PA-C Neurosurgery

## 2024-01-02 ENCOUNTER — Encounter: Payer: Self-pay | Admitting: Orthopedic Surgery

## 2024-01-02 ENCOUNTER — Telehealth: Admitting: Orthopedic Surgery

## 2024-01-02 DIAGNOSIS — M25511 Pain in right shoulder: Secondary | ICD-10-CM | POA: Diagnosis not present

## 2024-01-02 DIAGNOSIS — M47812 Spondylosis without myelopathy or radiculopathy, cervical region: Secondary | ICD-10-CM | POA: Diagnosis not present

## 2024-01-02 DIAGNOSIS — M4802 Spinal stenosis, cervical region: Secondary | ICD-10-CM | POA: Diagnosis not present

## 2024-01-02 DIAGNOSIS — M25512 Pain in left shoulder: Secondary | ICD-10-CM | POA: Diagnosis not present

## 2024-01-02 NOTE — Progress Notes (Signed)
 Referring Physician:  Dineen Rollene MATSU, [name] [address] [place],  [place] [address]  Primary Physician:  Dineen Rollene MATSU, [name]  History of Present Illness: 01/14/2024 Discussed the use of AI scribe software for clinical note transcription with the patient, who gave verbal consent to proceed.  History of Present Illness Benjamin Conley is a 58 year old male with severe cervical stenosis who presents with worsening weakness and gait instability.  He has severe cervical stenosis, particularly at the C5-6 level, with spinal cord compression at C3-4 and C4-5. A CT scan from January 10, 2024, shows multilevel spondylosis from C5 to C7 and osteophytes at C5-6 and C6-7, with the C5-6 level being the most affected.  He experiences worsening weakness and gait instability, requiring a cane for ambulation. He feels wobbly and has episodes where his legs give out, leading to falls. He has difficulty straightening up after sitting due to back tightness, with a history of two lumbar fusions.  His hands are weak, causing difficulty holding objects and frequent dropping. Severe cramping in his hands makes it difficult to straighten his fingers. He has a history of trigger finger, untreated due to diabetes.  Tobacco use: he quit in 2021   Bowel/Bladder Dysfunction: none   Conservative measures:  Physical therapy: Has participated at [facility] in [place] for shoulder pain not back or neck issues Multimodal medical therapy including regular antiinflammatories: Tylenol   Injections:   Lumbar epidural steroid injections per patient with [facility]-not sure what year   Past Surgery:  2009--Fusion L4-L5 at [facility] has no symptoms of cervical myelopathy.   The symptoms are causing a significant impact on the patient's life.     Exam: Physical Exam MUSCULOSKELETAL: Limited range of motion in both shoulders. Giveaway weakness in both shoulders. Elbow flexion strength  4/5 bilaterally. Elbow extension strength 4+ bilaterally. Wrist extension strength 4+ bilaterally. Grip strength 4/5 right, 4+ left. Intrinsic hand muscle strength 4/5 left, 4- right. NEUROLOGICAL: Hyperreflexia in left brachioradialis and bicep. Trace reflex in left tricep. Hyperreflexia in right bicep brachioradialis. Trace reflex in right tricep. Positive Hoffman's and Tromner sign on right. Positive Hoffman sign on left. Hyperreflexia and spreading in right and left patella. 1+ reflex in left and right Achilles. Hyperreflexic in right patella. Cross adductor reflexes on right. Unsteady on feet, unable to perform tandem gait.    Imaging: Cervical MRI dated 12/29/23:  FINDINGS:   BONES AND ALIGNMENT: Normal alignment. Normal vertebral body heights. Bone marrow signal is unremarkable.   SPINAL CORD: Normal spinal cord size. No abnormal spinal cord signal.   SOFT TISSUES: No paraspinal mass.   C2-C3: No significant disc herniation. No spinal canal stenosis or neural foraminal narrowing.   C3-C4: Small central disc protrusion with moderate spinal canal stenosis indenting the ventral spinal cord. No neural foraminal narrowing.   C4-C5: Small central disc protrusion indenting the ventral spinal cord and causing moderate spinal canal stenosis. No neural foraminal stenosis.   C5-C6: Disc bulge with left subarticular protrusion superimposed and mild bilateral uncovertebral spurring. Severe spinal canal stenosis. Moderate bilateral neural foraminal stenosis.   C6-C7: Small disc bulge with uncovertebral hypertrophy. Mild spinal canal stenosis. Moderate left foraminal stenosis.   C7-T1: No significant disc herniation. No spinal canal stenosis or neural foraminal narrowing.   IMPRESSION: 1. C5-C6. Disc bulge with left subarticular protrusion and mild bilateral uncovertebral spurring causing severe spinal canal stenosis and moderate bilateral neural foraminal stenosis. 2. C3-C4.  Small  central disc protrusion with moderate spinal canal stenosis. 3. C4-C5. Small central disc protrusion with moderate spinal canal stenosis. 4. C6-C7. Small disc bulge with uncovertebral hypertrophy causing mild spinal canal stenosis and moderate left foraminal stenosis.   Electronically signed by: Franky Stanford MD 01/01/2024 02:46 AM EDT RP Workstation: [id number]  Results RADIOLOGY Cervical spine MRI: Severe stenosis at C5-C6, spinal cord compression at C3-C4 and C4-C5, myelomalacia at C4-C5 and C5-C6 (12/29/2023) Cervical spine CT: Multilevel spondylosis from C5 to C7, osteophytes at C5-C6 and C6-C7, large bone spur at C6 (01/10/2024) Cervical spine X-ray: Loss of cervical lordosis, no major instability, disc degeneration most severe at C5-C6, followed by C4-C5 (01/14/2024)    Assessment and Plan: Assessment & Plan Cervical spinal stenosis with myelopathy and myelomalacia, C4-C6 Severe stenosis with myelopathy and myelomalacia at C4-C6, significant compression at C5-6 and C4-5. Myelopathy due to spinal cord compression affecting arm and leg function. Surgical intervention recommended due to progressive symptoms and functional impairment.Gait instability and frequent falls secondary to cervical myelopathy. Bilateral shoulder weakness and limited range of motion secondary to cervical myelopathy - Proceed with two-level anterior cervical discectomy and fusion from C4 to C6. - Discussed risks of surgery including spinal cord injury (1-5%) and C5 palsy (5-15%), as well as other postoperative complications such as sore throat and hardware issues.   Bilateral shoulder weakness and limited range of motion secondary to cervical myelopathy, but also given his multiple recent falls potentially shoulder pathology present - Recommend further evaluation of shoulder function post-surgery.  Penne MICAEL Sharps, MD  neurosurgery

## 2024-01-06 DIAGNOSIS — M25511 Pain in right shoulder: Secondary | ICD-10-CM | POA: Diagnosis not present

## 2024-01-06 DIAGNOSIS — M25512 Pain in left shoulder: Secondary | ICD-10-CM | POA: Diagnosis not present

## 2024-01-07 ENCOUNTER — Ambulatory Visit: Attending: Pulmonary Disease

## 2024-01-07 ENCOUNTER — Ambulatory Visit (INDEPENDENT_AMBULATORY_CARE_PROVIDER_SITE_OTHER)

## 2024-01-07 DIAGNOSIS — M7521 Bicipital tendinitis, right shoulder: Secondary | ICD-10-CM

## 2024-01-07 DIAGNOSIS — M7541 Impingement syndrome of right shoulder: Secondary | ICD-10-CM | POA: Diagnosis not present

## 2024-01-07 DIAGNOSIS — R0609 Other forms of dyspnea: Secondary | ICD-10-CM | POA: Diagnosis not present

## 2024-01-07 DIAGNOSIS — M7542 Impingement syndrome of left shoulder: Secondary | ICD-10-CM

## 2024-01-07 DIAGNOSIS — M7522 Bicipital tendinitis, left shoulder: Secondary | ICD-10-CM | POA: Diagnosis not present

## 2024-01-07 LAB — ECHOCARDIOGRAM COMPLETE
AR max vel: 3.54 cm2
AV Area VTI: 3.86 cm2
AV Area mean vel: 3.55 cm2
AV Mean grad: 3 mmHg
AV Peak grad: 4.6 mmHg
Ao pk vel: 1.07 m/s
Area-P 1/2: 5.54 cm2
S' Lateral: 3.4 cm

## 2024-01-07 NOTE — Progress Notes (Signed)
 Orthopedic Follow-Up Note  Chief complaint: Bilateral shoulder pain    SUBJECTIVE:   Benjamin Conley is a 58 y.o. year old  who presents for follow up of bilateral shoulder pain (L>R).  Patient was last seen in office on 11/26/2023 where he was diagnosed with bilateral shoulder biceps tendinitis, impingement syndrome.  Recommendations at that time were for formal physical therapy.  He was unable to receive steroid injection due to previous adverse reactions.  He reports compliance with physical therapy.  He attended 2 times a week for 6 weeks.  He noticed significant improvement in range of motion for his right shoulder and moderate improvement for the left shoulder.  However he does note continued pain mainly in the left shoulder.  He was also seen by neurosurgery since the last visit and MRI of the cervical spine revealed severe C5-C6 central stenosis and bilateral foraminal stenosis.  He has a follow-up consultation with Dr. Claudene of neurosurgery next week to discuss next steps.  Patient reports no new shoulder symptoms today.   Past Medical History:  Diagnosis Date   Acute posthemorrhagic anemia    Allergy 2015   Arthritis    Asthma 2008   Carotid artery stenosis    bilateral   Complication of anesthesia    c/o difficulty breathing after anesthesia   COPD (chronic obstructive pulmonary disease) (HCC)    Coronary artery disease    Diabetes mellitus without complication (HCC)    GERD (gastroesophageal reflux disease)    Hyperlipemia    Hypertension    Melena 08/21/2019   Paroxysmal supraventricular tachycardia 08/19/2019   Postural dizziness with presyncope 08/21/2019   Renal disorder    early stage CKD   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   Tobacco abuse    Ulcer    Upper GI bleed 08/21/2019   Past Surgical History:  Procedure Laterality Date   BACK SURGERY     lumbar   COLONOSCOPY WITH PROPOFOL  N/A 08/24/2019   Procedure: COLONOSCOPY WITH PROPOFOL ;  Surgeon: Jinny Carmine, MD;   Location: [facility] ENDOSCOPY;  Service: Endoscopy;  Laterality: N/A;   ESOPHAGOGASTRODUODENOSCOPY (EGD) WITH PROPOFOL  N/A 08/24/2019   Procedure: ESOPHAGOGASTRODUODENOSCOPY (EGD) WITH PROPOFOL ;  Surgeon: Jinny Carmine, MD;  Location: [facility] ENDOSCOPY;  Service: Endoscopy;  Laterality: N/A;   SPINE SURGERY  2008   STENT PLACEMENT VASCULAR ([facility] HX)  07/2019   stenosis of distal splenic artery and stent placed   VISCERAL ANGIOGRAPHY N/A 08/06/2019   Procedure: VISCERAL ANGIOGRAPHY;  Surgeon: Jama Cordella MATSU, MD;  Location: [facility] INVASIVE CV LAB;  Service: Cardiovascular;  Laterality: N/A;    Current Outpatient Medications:    acetaminophen  (TYLENOL ) 500 MG tablet, Take 2 tablets (1,000 mg total) by mouth 3 (three) times daily., Disp: 540 tablet, Rfl: 3   albuterol  (VENTOLIN  HFA) 108 (90 Base) MCG/ACT inhaler, Inhale 2 puffs into the lungs every 6 (six) hours as needed for wheezing or shortness of breath., Disp: 8 g, Rfl: 2   aspirin  81 MG EC tablet, Take 1 tablet (81 mg total) by mouth daily., Disp: 90 tablet, Rfl: 3   atorvastatin  (LIPITOR) 40 MG tablet, Take 20 mg by mouth daily., Disp: , Rfl:    blood glucose meter kit and supplies, Dispense based on patient and insurance preference. Use up to four times daily as directed. (FOR ICD-10 E10.9, E11.9)., Disp: 1 each, Rfl: 0   Blood Glucose Monitoring Suppl DEVI, 1 each by Does not apply route in the morning, at noon, and at bedtime. May substitute to  any manufacturer covered by patient's insurance., Disp: 1 each, Rfl: 0   budesonide -glycopyrrolate -formoterol  (BREZTRI ) 160-9-4.8 MCG/ACT AERO inhaler, Inhale 2 puffs into the lungs 2 (two) times daily., Disp: 10.7 g, Rfl: 1   clopidogrel  (PLAVIX ) 75 MG tablet, Take 1 tablet by mouth once daily, Disp: 90 tablet, Rfl: 0   Continuous Glucose Sensor (DEXCOM G7 SENSOR) MISC, CHECK SUGARS CONTINUOUSLY, Disp: 6 each, Rfl: 2   cyanocobalamin  (VITAMIN B12) 1000 MCG tablet, Take 1,000 mcg by mouth daily., Disp: ,  Rfl:    digoxin  (LANOXIN ) 0.25 MG tablet, Take 1 tablet (0.25 mg total) by mouth daily., Disp: 30 tablet, Rfl: 3   empagliflozin  (JARDIANCE ) 25 MG TABS tablet, Take 1 tablet (25 mg total) by mouth daily before breakfast., Disp: 90 tablet, Rfl: 3   Ensifentrine  (OHTUVAYRE ) 3 MG/2.5ML SUSP, Inhale 2.5 mLs into the lungs 2 (two) times daily., Disp: , Rfl:    famotidine  (PEPCID ) 20 MG tablet, Take 1 tablet (20 mg total) by mouth at bedtime., Disp: 90 tablet, Rfl: 1   Glucose Blood (BLOOD GLUCOSE TEST STRIPS) STRP, 1 each by Does not apply route in the morning, at noon, and at bedtime. May substitute to any manufacturer covered by patient's insurance., Disp: 100 strip, Rfl: 0   ipratropium-albuterol  (DUONEB) 0.5-2.5 (3) MG/3ML SOLN, Take 3 mLs by nebulization every 6 (six) hours., Disp: 360 mL, Rfl: 1   Lancet Device MISC, 1 each by Does not apply route in the morning, at noon, and at bedtime. May substitute to any manufacturer covered by patient's insurance., Disp: 1 each, Rfl: 0   Lancets Misc. MISC, 1 each by Does not apply route in the morning, at noon, and at bedtime. May substitute to any manufacturer covered by patient's insurance., Disp: 100 each, Rfl: 0   midodrine  (PROAMATINE ) 10 MG tablet, Take 10 mg by mouth in the morning., Disp: , Rfl:    Semaglutide , 2 MG/DOSE, (OZEMPIC , 2 MG/DOSE,) 8 MG/3ML SOPN, INJECT 2MG  SUBCUTANEOUSLY ONCE WEEKLY, Disp: 9 mL, Rfl: 0   silodosin  (RAPAFLO ) 8 MG CAPS capsule, TAKE 1 CAPSULE BY MOUTH ONCE DAILY WITH BREAKFAST, Disp: 30 capsule, Rfl: 0   triamcinolone  cream (KENALOG ) 0.1 %, Apply 1 application topically 2 (two) times daily., Disp: 30 g, Rfl: 0 Allergies  Allergen Reactions   Wasp Venom Anaphylaxis   Wasp Venom Protein Anaphylaxis   Coffee Flavoring Agent (Non-Screening) Nausea And Vomiting    Patient states coffee gives him nausea and stomach cramps   Onion Nausea And Vomiting and Nausea Only   Prednisone  Other (See Comments)    DKA after IV solu  medrol  following by oral prednionse  prednisone    Social History   Socioeconomic History   Marital status: Single    Spouse name: Not on file   Number of children: Not on file   Years of education: Not on file   Highest education level: Not on file  Occupational History   Not on file  Tobacco Use   Smoking status: Former    Current packs/day: 0.00    Average packs/day: 2.0 packs/day for 48.0 years (96.0 ttl pk-yrs)    Types: Cigarettes    Start date: 02/12/1972    Quit date: 02/12/2020    Years since quitting: 3.9   Smokeless tobacco: Never  Vaping Use   Vaping status: Never Used  Substance and Sexual Activity   Alcohol use: No    Comment: Never been a problem   Drug use: No   Sexual activity: Yes  Other Topics Concern   Not on file  Social History Narrative   Single, lives alone, unemployed.   Social Drivers of Corporate Investment Banker Strain: Low Risk  (10/31/2023)   Overall Financial Resource Strain (CARDIA)    Difficulty of Paying Living Expenses: Not hard at all  Recent Concern: Financial Resource Strain - Medium Risk (08/21/2023)   Received from [facility]   Overall Financial Resource Strain (CARDIA)    Difficulty of Paying Living Expenses: Somewhat hard  Food Insecurity: No Food Insecurity (10/31/2023)   Hunger Vital Sign    Worried About Running Out of Food in the Last Year: Never true    Ran Out of Food in the Last Year: Never true  Recent Concern: Food Insecurity - Food Insecurity Present (08/28/2023)   Hunger Vital Sign    Worried About Running Out of Food in the Last Year: Sometimes true    Ran Out of Food in the Last Year: Never true  Transportation Needs: No Transportation Needs (10/31/2023)   PRAPARE - Administrator, Civil Service (Medical): No    Lack of Transportation (Non-Medical): No  Physical Activity: Sufficiently Active (10/31/2023)   Exercise Vital Sign    Days of Exercise per Week: 6 days    Minutes of  Exercise per Session: 60 min  Recent Concern: Physical Activity - Insufficiently Active (08/28/2023)   Exercise Vital Sign    Days of Exercise per Week: 3 days    Minutes of Exercise per Session: 10 min  Stress: No Stress Concern Present (10/31/2023)   [employer] of Occupational Health - Occupational Stress Questionnaire    Feeling of Stress: Not at all  Social Connections: Moderately Isolated (10/31/2023)   Social Connection and Isolation Panel    Frequency of Communication with Friends and Family: Once a week    Frequency of Social Gatherings with Friends and Family: More than three times a week    Attends Religious Services: Never    Database Administrator or Organizations: Yes    Attends Banker Meetings: Never    Marital Status: Never married  Intimate Partner Violence: Not At Risk (10/31/2023)   Humiliation, Afraid, Rape, and Kick questionnaire    Fear of Current or Ex-Partner: No    Emotionally Abused: No    Physically Abused: No    Sexually Abused: No   Family History  Problem Relation Age of Onset   Diabetes Mother    Diabetes Mellitus II Mother    Obesity Mother    Tuberculosis Father    Heart disease Father    Diabetes Father    Diabetes Sister    Diabetes Sister    Diabetes Sister    Diabetes Sister    Diabetes Brother    Diabetes Mellitus II Brother    Hyperlipidemia Brother    Obesity Brother    Cancer Maternal Aunt    Colon cancer Maternal Uncle    Obesity Sister    Obesity Sister    Obesity Sister    Obesity Sister    Prostate cancer Neg Hx      ROS: A review of systems was performed and is negative unless stated above in HPI    OBJECTIVE:    Constitutional:   The patient is alert and oriented x 3, appears to be stated age and in no distress.   Orthopaedic Examination:   Bilateral shoulder: Skin is intact about bilateral shoulders.  Subtle muscle atrophy noted  about the left posterior shoulder.  Tenderness to palpation  anteriorly about both shoulders over the long head biceps tendons.  5/5 motor strength in supraspinatus, infraspinatus and subscapularis bilaterally.  Right shoulder range of motion: Active forward flexion 130 degrees, passive forward flexion 170 degrees.  Active and passive external rotation 50 degrees.  Active and passive internal rotation to L1.  Left shoulder range of motion: Active forward flexion 80 degrees, passive forward flexion 120 degrees.  Active and passive external rotation 40 degrees.  Active and passive internal rotation L5.  Positive Hawkins and Neer's test bilaterally.  IMAGING:   No new imaging at today's encounter     ASSESSMENT:  Bilateral shoulder biceps tendinitis Bilateral shoulder impingement syndrome Cervical spine moderate to severe central and foraminal stenosis     PLAN:   Patient was seen in office today for follow up of bilateral shoulder pain. Since last encounter, patient reports improvement in range of motion of bilateral shoulders, right greater than the left. Interventions attempted include physical therapy.  Unable to receive steroid injections due to previous adverse reaction.  Still with left shoulder pain and decreased range of motion on exam today.  We discussed next steps with the patient at today's encounter.  He has concomitant severe cervical spine pathology which was discussed with neurosurgery at a recent visit.  MRI findings of cervical spine central and foraminal stenosis at multiple levels discovered.  Patient has upcoming neurosurgery consultation to discuss intervention.  Talked with patient today about next steps from orthopedic standpoint.  Explained that there certainly can be overlap of cervical and shoulder symptoms especially at the C5-C6 levels which may be contributing to shoulder pain.  We believe cervical spine issues take precedent at this time.  He does have good motor strength within the rotator cuff bilaterally.  Plan will be to have  patient follow through on neurosurgical interventions.  Once cervical spine pathology has been addressed, encourage patient to follow-up with our office if symptoms in the shoulders persist.  MRI may be considered at that time.  Advised patient to continue with physical therapy and home exercises to maintain shoulder strength and range of motion.  All questions and concerns were answered to the best of my ability.  Patient can call any time with further concerns.  Follow-up: After neurosurgical intervention if symptoms persist   Arlyss Schneider, DO Orthopedic Surgery & Sports Medicine [facility]

## 2024-01-07 NOTE — Patient Instructions (Signed)

## 2024-01-08 ENCOUNTER — Ambulatory Visit: Payer: Self-pay | Admitting: Pulmonary Disease

## 2024-01-09 ENCOUNTER — Encounter: Payer: Self-pay | Admitting: Cardiology

## 2024-01-09 ENCOUNTER — Encounter: Payer: Self-pay | Admitting: Family

## 2024-01-09 DIAGNOSIS — M25512 Pain in left shoulder: Secondary | ICD-10-CM | POA: Diagnosis not present

## 2024-01-09 DIAGNOSIS — M25511 Pain in right shoulder: Secondary | ICD-10-CM | POA: Diagnosis not present

## 2024-01-09 MED ORDER — ATORVASTATIN CALCIUM 40 MG PO TABS: 20.0000 mg | ORAL_TABLET | Freq: Every day | ORAL | 3 refills | Status: AC

## 2024-01-09 NOTE — Telephone Encounter (Signed)
 Benjamin Conley,   Pt stated you had prescribed Atorvastatin  for him before but I last PCP I seen prescribe them was  Darliss Rogue, MD. He has a week left of meds left so I wasn't sure did you want me to send him in more medication

## 2024-01-10 ENCOUNTER — Emergency Department

## 2024-01-10 ENCOUNTER — Encounter: Payer: Self-pay | Admitting: Emergency Medicine

## 2024-01-10 ENCOUNTER — Encounter: Payer: Self-pay | Admitting: Family

## 2024-01-10 DIAGNOSIS — E1122 Type 2 diabetes mellitus with diabetic chronic kidney disease: Secondary | ICD-10-CM | POA: Insufficient documentation

## 2024-01-10 DIAGNOSIS — I129 Hypertensive chronic kidney disease with stage 1 through stage 4 chronic kidney disease, or unspecified chronic kidney disease: Secondary | ICD-10-CM | POA: Diagnosis not present

## 2024-01-10 DIAGNOSIS — I2510 Atherosclerotic heart disease of native coronary artery without angina pectoris: Secondary | ICD-10-CM | POA: Diagnosis not present

## 2024-01-10 DIAGNOSIS — J449 Chronic obstructive pulmonary disease, unspecified: Secondary | ICD-10-CM | POA: Diagnosis not present

## 2024-01-10 DIAGNOSIS — R197 Diarrhea, unspecified: Secondary | ICD-10-CM | POA: Diagnosis not present

## 2024-01-10 DIAGNOSIS — W010XXA Fall on same level from slipping, tripping and stumbling without subsequent striking against object, initial encounter: Secondary | ICD-10-CM | POA: Diagnosis not present

## 2024-01-10 DIAGNOSIS — N189 Chronic kidney disease, unspecified: Secondary | ICD-10-CM | POA: Diagnosis not present

## 2024-01-10 DIAGNOSIS — M542 Cervicalgia: Secondary | ICD-10-CM | POA: Insufficient documentation

## 2024-01-10 NOTE — ED Triage Notes (Signed)
 Pt tripped and fell onto knees and hands onto ground. Did not hit head. Previous MRI few weeks ago showed compressed spinal cord and now having stabbing dull pain in neck and in between shoulders. C collar placed in triage. Denies urinary and fecal incontinence.

## 2024-01-11 ENCOUNTER — Emergency Department
Admission: EM | Admit: 2024-01-11 | Discharge: 2024-01-11 | Disposition: A | Discharge status: Home / Self Care | Attending: Emergency Medicine | Admitting: Emergency Medicine

## 2024-01-11 DIAGNOSIS — W19XXXA Unspecified fall, initial encounter: Secondary | ICD-10-CM

## 2024-01-11 DIAGNOSIS — M542 Cervicalgia: Secondary | ICD-10-CM

## 2024-01-11 DIAGNOSIS — R197 Diarrhea, unspecified: Secondary | ICD-10-CM

## 2024-01-11 NOTE — Discharge Instructions (Signed)
 Your CT scan and x-rays are reassuring.  There is no sign of a worsening nerve injury, fracture, or other acute problem in your neck or upper back.  Likely you have strained the neck due to the fall.  You can continue any pain medications that you are already on.  Follow-up with the spine surgeon as scheduled.  Return to the ER for new, worsening, or persistent severe neck pain, new weakness or numbness in your arms or legs, worsening diarrhea, blood in the stool, abdominal pain, fever, vomiting, weakness, or any other new or worsening symptoms that concern you.

## 2024-01-11 NOTE — ED Provider Notes (Signed)
 [facility] Provider Note    Event Date/Time   First MD Initiated Contact with Patient 01/11/24 [phone]     (approximate)   History   Fall   HPI  Benjamin Conley is a 58 y.o. male with a history of COPD, CAD, diabetes, hypertension, hyperlipidemia, paroxysmal SVT, early stage CKD, and arthritis who presents with 2 primary complaints, neck pain after a fall yesterday morning, and diarrhea.  The patient states that he had a mechanical fall yesterday morning when he tripped on a wire outside, causing him to fall on both extended hands.  Since that time he has had neck and upper back pain.  He was diagnosed with herniated disc on MRI recently and is scheduled for outpatient follow-up.  He denies any weakness or numbness in the arms or legs.  Then earlier today, he developed diarrhea, and has had about 10 episodes although has not had any diarrhea in the last 2-1/2 hours since arriving in the ED. he has had some nausea but no vomiting.  He denies any abdominal pain.  He feels like the diarrhea is subsiding.  I reviewed the past medical records.  Prior to today the patient's most recent outpatient encounter was on 10/29 with orthopedics for follow-up of bilateral shoulder pain.  He was diagnosed previously with bilateral shoulder biceps tendinitis and impingement syndrome.  He has been going to physical therapy.  MRI of the cervical spine on 10/20 showed a C5-C6 disc bulge with spinal canal stenosis and neural foraminal stenosis, and other multilevel disc protrusion in the cervical spine.   Physical Exam   Triage Vital Signs: ED Triage Vitals [01/10/24 2244]  Encounter Vitals Group     BP (!) 121/90     Girls Systolic BP Percentile      Girls Diastolic BP Percentile      Boys Systolic BP Percentile      Boys Diastolic BP Percentile      Pulse Rate (!) 112     Resp 18     Temp 98.6 F (37 C)     Temp Source Oral     SpO2 99 %     Weight      Height      Head  Circumference      Peak Flow      Pain Score      Pain Loc      Pain Education      Exclude from Growth Chart     Most recent vital signs: Vitals:   01/10/24 2244  BP: (!) 121/90  Pulse: (!) 112  Resp: 18  Temp: 98.6 F (37 C)  SpO2: 99%    General: Awake, no distress.  CV:  Good peripheral perfusion.  Resp:  Normal effort.  Abd:  Soft and nontender.  No distention.  Other:  Mild lower cervical and upper thoracic midline tenderness with no step-off or crepitus.  5/5 motor strength and intact sensation bilateral upper extremities.  Normal gait.  Normal lower extremity motor strength.  No ataxia.    ED Results / Procedures / Treatments   Labs (all labs ordered are listed, but only abnormal results are displayed) Labs Reviewed - No data to display   EKG     RADIOLOGY  XR thoracic spine: No acute fracture  CT cervical spine: No acute fracture  PROCEDURES:  Critical Care performed: No  Procedures   MEDICATIONS ORDERED IN ED: Medications - No data to display  IMPRESSION / MDM / ASSESSMENT AND PLAN / ED COURSE  I reviewed the triage vital signs and the nursing notes.  58 year old male with PMH as noted above presents with an acute exacerbation of neck and upper back pain after mechanical fall from standing height, as well as some diarrhea over the last day.  He has no neurologic deficits on exam and is overall very well-appearing.  Abdomen is nontender.  He feels that the diarrhea is subsiding.  Differential diagnosis includes, but is not limited to:   Back pain, muscle strain or spasm, ligament strain, exacerbation of herniated disc, fracture.  CT of the cervical spine and x-rays of the thoracic spine were obtained from triage show no evidence of fracture.  Cervical spine findings are stable from his recent MRI.  There is no indication for further workup.  The patient declines any pain medication here.  Diarrhea: This is most consistent with  gastroenteritis, foodborne illness, or other benign cause.  The patient was slightly tachycardic at triage.  However he is overall well-appearing.  Abdomen is nontender.  He feels that the symptoms are improving.  I considered and offered giving fluids and obtaining labs, however the patient declines and states he feels well to go home.  Patient's presentation is most consistent with acute complicated illness / injury requiring diagnostic workup.  The patient is stable for discharge at this time.  I have instructed him to follow-up with neurosurgery as is already scheduled.  I gave strict return precautions, and he expressed understanding.   FINAL CLINICAL IMPRESSION(S) / ED DIAGNOSES   Final diagnoses:  Fall, initial encounter  Neck pain  Diarrhea, unspecified type     Rx / DC Orders   ED Discharge Orders     None        Note:  This document was prepared using Dragon voice recognition software and may include unintentional dictation errors.    Jacolyn Pae, MD 01/11/24 0130

## 2024-01-12 ENCOUNTER — Encounter: Payer: Self-pay | Admitting: Radiology

## 2024-01-13 NOTE — Telephone Encounter (Signed)
 Noted

## 2024-01-14 ENCOUNTER — Encounter: Payer: Self-pay | Admitting: Family

## 2024-01-14 ENCOUNTER — Telehealth (HOSPITAL_BASED_OUTPATIENT_CLINIC_OR_DEPARTMENT_OTHER): Payer: Self-pay

## 2024-01-14 ENCOUNTER — Other Ambulatory Visit: Payer: Self-pay

## 2024-01-14 ENCOUNTER — Ambulatory Visit: Payer: Self-pay | Admitting: Neurosurgery

## 2024-01-14 ENCOUNTER — Telehealth: Payer: Self-pay

## 2024-01-14 ENCOUNTER — Ambulatory Visit

## 2024-01-14 ENCOUNTER — Ambulatory Visit: Admitting: Neurosurgery

## 2024-01-14 VITALS — BP 140/88 | Ht 64.0 in | Wt 160.0 lb

## 2024-01-14 DIAGNOSIS — M47812 Spondylosis without myelopathy or radiculopathy, cervical region: Secondary | ICD-10-CM | POA: Diagnosis not present

## 2024-01-14 DIAGNOSIS — Z01818 Encounter for other preprocedural examination: Secondary | ICD-10-CM

## 2024-01-14 DIAGNOSIS — M4802 Spinal stenosis, cervical region: Secondary | ICD-10-CM

## 2024-01-14 DIAGNOSIS — S4991XA Unspecified injury of right shoulder and upper arm, initial encounter: Secondary | ICD-10-CM

## 2024-01-14 DIAGNOSIS — M4712 Other spondylosis with myelopathy, cervical region: Secondary | ICD-10-CM | POA: Insufficient documentation

## 2024-01-14 DIAGNOSIS — R296 Repeated falls: Secondary | ICD-10-CM

## 2024-01-14 DIAGNOSIS — S4992XA Unspecified injury of left shoulder and upper arm, initial encounter: Secondary | ICD-10-CM

## 2024-01-14 DIAGNOSIS — S4990XS Unspecified injury of shoulder and upper arm, unspecified arm, sequela: Secondary | ICD-10-CM | POA: Insufficient documentation

## 2024-01-14 NOTE — Telephone Encounter (Signed)
   Name: Benjamin Conley  DOB: 28-Mar-1965  MRN: [id number]  Primary Cardiologist: Redell Cave, MD   Preoperative team, please contact this patient and set up a phone call appointment for further preoperative risk assessment. Please obtain consent and complete medication review. Thank you for your help.  I confirm that guidance regarding antiplatelet and oral anticoagulation therapy has been completed and, if necessary, noted below.  Per office protocol, if patient is without any new symptoms or concerns at the time of their virtual visit, he may hold Plavix  for 5 days prior to procedure. Please resume Plavix  as soon as possible postprocedure, at the discretion of the surgeon.    I also confirmed the patient resides in the state of [place] . As per [facility] Medical Board telemedicine laws, the patient must reside in the state in which the provider is licensed.   Lamarr Satterfield, NP 01/14/2024, 12:43 PM [facility]

## 2024-01-14 NOTE — Telephone Encounter (Signed)
 We received clearance paperwork for patient's upcoming procedure from [facility] at [facility] via fax.  I sent a copy to Rollene Northern, [name]'s folder on the S drive and hand-delivered a copy to Jenate Jordan, CMA.

## 2024-01-14 NOTE — Telephone Encounter (Signed)
   Pre-operative Risk Assessment    Patient Name: Benjamin Conley  DOB: 10-13-1965 MRN: [id number]   Date of last office visit: 10/30/23 with Dr. Darliss Date of next office visit: NA   Request for Surgical Clearance    Procedure:  C4-6 Anterior Cervical Discectomy and Fusion   Date of Surgery:  Clearance 02/17/24                                 Surgeon:  Dr. Penne Sharps Surgeon's Group or Practice Name:  [facility] Phone number:  [phone] Fax number:  [phone]   Type of Clearance Requested:   - Medical  - Pharmacy:  Hold Clopidogrel  (Plavix ) for 7 days prior and resume 14 days post procedure, Aspirin  is stated that it is okay to stay on for procedure.    Type of Anesthesia:  General    Additional requests/questions:    Signed, Augustin JONETTA Daring   01/14/2024, 12:11 PM

## 2024-01-14 NOTE — Telephone Encounter (Signed)
 Patient has been scheduled for televisit med rec and consent done     Patient Consent for Virtual Visit         Benjamin Conley has provided verbal consent on 01/14/2024 for a virtual visit (video or telephone).   CONSENT FOR VIRTUAL VISIT FOR:  Benjamin Conley  By participating in this virtual visit I agree to the following:  I hereby voluntarily request, consent and authorize [facility] and its employed or contracted physicians, physician assistants, nurse practitioners or other licensed health care professionals (the Practitioner), to provide me with telemedicine health care services (the "Services) as deemed necessary by the treating Practitioner. I acknowledge and consent to receive the Services by the Practitioner via telemedicine. I understand that the telemedicine visit will involve communicating with the Practitioner through live audiovisual communication technology and the disclosure of certain medical information by electronic transmission. I acknowledge that I have been given the opportunity to request an in-person assessment or other available alternative prior to the telemedicine visit and am voluntarily participating in the telemedicine visit.  I understand that I have the right to withhold or withdraw my consent to the use of telemedicine in the course of my care at any time, without affecting my right to future care or treatment, and that the Practitioner or I may terminate the telemedicine visit at any time. I understand that I have the right to inspect all information obtained and/or recorded in the course of the telemedicine visit and may receive copies of available information for a reasonable fee.  I understand that some of the potential risks of receiving the Services via telemedicine include:  Delay or interruption in medical evaluation due to technological equipment failure or disruption; Information transmitted may not be sufficient (e.g. poor resolution  of images) to allow for appropriate medical decision making by the Practitioner; and/or  In rare instances, security protocols could fail, causing a breach of personal health information.  Furthermore, I acknowledge that it is my responsibility to provide information about my medical history, conditions and care that is complete and accurate to the best of my ability. I acknowledge that Practitioner's advice, recommendations, and/or decision may be based on factors not within their control, such as incomplete or inaccurate data provided by me or distortions of diagnostic images or specimens that may result from electronic transmissions. I understand that the practice of medicine is not an exact science and that Practitioner makes no warranties or guarantees regarding treatment outcomes. I acknowledge that a copy of this consent can be made available to me via my patient portal [facility] MyChart), or I can request a printed copy by calling the office of [facility].    I understand that my insurance will be billed for this visit.   I have read or had this consent read to me. I understand the contents of this consent, which adequately explains the benefits and risks of the Services being provided via telemedicine.  I have been provided ample opportunity to ask questions regarding this consent and the Services and have had my questions answered to my satisfaction. I give my informed consent for the services to be provided through the use of telemedicine in my medical care

## 2024-01-14 NOTE — Telephone Encounter (Signed)
 Patient has been scheduled for TELEVISIT

## 2024-01-14 NOTE — Patient Instructions (Signed)
 Please see below for information in regards to your upcoming surgery:   Planned surgery: C4-6 Anterior Cervical Discectomy and Fusion    Surgery date: 02/17/24 at [facility] (Medical Mall: [address], [place], [place] [address]) - you will find out your arrival time the business day before your surgery.    Pre-op appointment at [facility] Pre-admit Testing: you will receive a call with a date/time for this appointment. If you are scheduled for an in person appointment, Pre-admit Testing is located on the first floor of the [facility] building, [address], [address]. During this appointment, they will advise you which medications you can take the morning of surgery, and which medications you will need to hold for surgery. Labs (such as blood work, EKG) may be done at your pre-op appointment. You are not required to fast for these labs. Should you need to change your pre-op appointment, please call Pre-admit testing at [phone].      Blood thinners:  Aspirin  81mg :  OK to stay on aspirin  81mg   Plavix  (clopidogrel ):  Stop Plavix  7 days prior, resume Plavix  14 days after    Diabetes/heart failure/kidney disease/weight loss medications that require an extended hold: Per anesthesia guidelines (due to the increased risk of aspiration caused by delayed gastric emptying):  Ozempic  Injectable: Stop 7 days prior to surgery  Jardiance  - hold for 3 days prior to surgery     Surgical clearance: we will send a clearance form to Rollene Northern, [name] & [facility]. They may wish to see you in their office prior to signing the clearance form. If so, they may call you to schedule an appointment.      NSAIDS (Non-steroidal anti-inflammatory drugs): because you are having a fusion, please avoid taking any NSAIDS (examples: ibuprofen, motrin, aleve, naproxen, meloxicam, diclofenac) for 3 months after surgery. Celebrex is an exception and is OK  to take, if prescribed. Tylenol  is not an NSAID.     Common restrictions after spine surgery: No bending, lifting, or twisting ("BLT"). Avoid lifting objects heavier than 10 pounds for the first 6 weeks after surgery. Where possible, avoid household activities that involve lifting, bending, reaching, pushing, or pulling such as laundry, vacuuming, grocery shopping, and childcare. Try to arrange for help from friends and family for these activities while you heal. Do not drive while taking prescription pain medication. Weeks 6 through 12 after surgery: avoid lifting more than 25 pounds.     X-rays after surgery: Because you are having a fusion or arthroplasty: for appointments after your 2 week follow-up: please arrive our office 30 minutes prior to your appointment for x-rays. This applies to every appointment after your 2 week follow-up. Failure to do so may result in your appointment being rescheduled.     How to contact us :  If you have any questions/concerns before or after surgery, you can reach us  at [phone], or you can send a mychart message. We can be reached by phone or mychart 8am-4pm, Monday-Friday.  *Please note: Calls after 4pm are forwarded to a third party answering service. Mychart messages are not routinely monitored during evenings, weekends, and holidays. Please call our office to contact the answering service for urgent concerns during non-business hours.     If you have FMLA/disability paperwork, please drop it off or fax it to [phone]   Appointments/FMLA & disability paperwork: Reche Hait, & Nichole Registered Nurse/Surgery scheduler: Kendelyn, RN & Katie, RN Certified Medical Assistants: Don, CMA,  Elenor, CMA, Glen, CMA, & Brewer, [place] Physician Assistants: Lyle Decamp, PA-C, Edsel Goods, PA-C & Glade Boys, PA-C Surgeons: Penne Sharps, MD & Reeves Daisy, MD    [facility] PREADMIT TESTING VISIT and SURGERY  INFORMATION SHEET   Now that surgery has been scheduled you can anticipate several phone calls from [facility] services. A pharmacy technician will call you to verify your current list of medications taken at home.               The Pre-Service Center will call to verify your insurance information and to give you billing estimates and information.             The Preadmit Testing Office will be calling to schedule a visit to obtain information for the anesthesia team and provide instructions on preparation for surgery.  What can you expect for the Preadmit Testing Visit: Appointments may be scheduled in-person or by telephone.  If a telephone visit is scheduled, you may be asked to come into the office to have lab tests or other studies performed.   This visit will not be completed any greater than 14 days prior to your surgery.  If your surgery has been scheduled for a future date, please do not be alarmed if we have not contacted you to schedule an appointment more than a month prior to the surgery date.    Please be prepared to provide the following information during this appointment:            -Personal medical history                                               -Medication and allergy list            -Any history of problems with anesthesia              -Recent lab work or diagnostic studies            -Please notify us  of any needs we should be aware of to provide the best care possible           -You will be provided with instructions on how to prepare for your surgery.    On The Day of Surgery:  You must have a driver to take you home after surgery, you will be asked not to drive for 24 hours following surgery.  Taxi, Gisele and non-medical transport will not be acceptable means of transportation unless you have a responsible individual who will be traveling with you.  Visitors in the surgical area:   2 people will be able to visit you in your room once your preparation for surgery  has been completed. During surgery, your visitors will be asked to wait in the Surgery Waiting Area.  It is not a requirement for them to stay, if they prefer to leave and come back.  Your visitor(s) will be given an update once the surgery has been completed.  No visitors are allowed in the initial recovery room to respect patient privacy and safety.  Once you are more awake and transfer to the secondary recovery area, or are transferred to an inpatient room, visitors will again be able to see you.  To respect and protect your privacy: We will ask on the day of surgery who your driver will be and  what the contact number for that individual will be. We will ask if it is okay to share information with this individual, or if there is an alternative individual that we, or the surgeon, should contact to provide updates and information. If family or friends come to the surgical information desk requesting information about you, who you have not listed with us , no information will be given.   It may be helpful to designate someone as the main contact who will be responsible for updating your other friends and family.    PREADMIT TESTING OFFICE: [phone] SAME DAY SURGERY: [phone] We look forward to caring for you before and throughout the process of your surgery.

## 2024-01-14 NOTE — Telephone Encounter (Signed)
 Tried contacting patient to schedule TELEVISIT no answer left a detailed vm to call back and schedule

## 2024-01-15 ENCOUNTER — Other Ambulatory Visit (INDEPENDENT_AMBULATORY_CARE_PROVIDER_SITE_OTHER): Payer: Self-pay | Admitting: Vascular Surgery

## 2024-01-15 ENCOUNTER — Telehealth: Payer: Self-pay | Admitting: Family

## 2024-01-15 DIAGNOSIS — I7772 Dissection of iliac artery: Secondary | ICD-10-CM

## 2024-01-15 NOTE — Telephone Encounter (Signed)
 Pt has appt scheduled with you and Cardiology already

## 2024-01-15 NOTE — Telephone Encounter (Signed)
 Call patient He will need pre op  clearance from me.   Please ensure that he schedules preop clearance to cardiology as well.

## 2024-01-19 ENCOUNTER — Ambulatory Visit (INDEPENDENT_AMBULATORY_CARE_PROVIDER_SITE_OTHER)

## 2024-01-19 ENCOUNTER — Ambulatory Visit (INDEPENDENT_AMBULATORY_CARE_PROVIDER_SITE_OTHER): Admitting: Nurse Practitioner

## 2024-01-19 ENCOUNTER — Encounter (INDEPENDENT_AMBULATORY_CARE_PROVIDER_SITE_OTHER): Payer: Self-pay | Admitting: Nurse Practitioner

## 2024-01-19 ENCOUNTER — Telehealth: Payer: Self-pay

## 2024-01-19 VITALS — BP 137/89 | HR 90 | Resp 18 | Ht 64.0 in | Wt 168.0 lb

## 2024-01-19 DIAGNOSIS — I7772 Dissection of iliac artery: Secondary | ICD-10-CM

## 2024-01-19 DIAGNOSIS — I739 Peripheral vascular disease, unspecified: Secondary | ICD-10-CM

## 2024-01-19 DIAGNOSIS — I748 Embolism and thrombosis of other arteries: Secondary | ICD-10-CM | POA: Diagnosis not present

## 2024-01-19 DIAGNOSIS — I10 Essential (primary) hypertension: Secondary | ICD-10-CM | POA: Diagnosis not present

## 2024-01-19 NOTE — Progress Notes (Signed)
 Subjective:    Patient ID: Benjamin Conley, male    DOB: 1965/07/27, 58 y.o.   MRN: [id number] Chief Complaint  Patient presents with   Follow-up    4 month follow up aorta/iliac + consult per secure chat with Dineen    The patient is a 58 year old male who returns today for follow-up evaluation of a left common iliac artery dissection.  This dissection was found incidentally after a CT scan in July 2025.  Unfortunately the patient has significant back issues.  He notes that he has weakness just with standing.  In fact he is actually supposed to have an upcoming surgery on his neck due to these issues.  He previously had noninvasive studies which showed noncompressible ABIs but strong waveforms and good TBI's.  In evaluation of the CT scan the dissection does not appear to be flow-limiting.  Duplex today confirms this and velocities do not indicate any significant stenosis as the course of the dissection.    Review of Systems  Musculoskeletal:  Positive for arthralgias, gait problem, neck pain and neck stiffness.  Neurological:  Positive for weakness and numbness.  All other systems reviewed and are negative.      Objective:   Physical Exam Vitals reviewed.  HENT:     Head: Normocephalic.  Cardiovascular:     Rate and Rhythm: Normal rate.     Pulses:          Dorsalis pedis pulses are detected w/ Doppler on the right side and detected w/ Doppler on the left side.       Posterior tibial pulses are detected w/ Doppler on the right side and detected w/ Doppler on the left side.  Pulmonary:     Effort: Pulmonary effort is normal.  Skin:    General: Skin is warm and dry.  Neurological:     Mental Status: He is alert and oriented to person, place, and time.     Motor: Weakness present.     Gait: Gait abnormal.  Psychiatric:        Mood and Affect: Mood normal.        Behavior: Behavior normal.        Thought Content: Thought content normal.        Judgment: Judgment normal.      BP 137/89 (BP Location: Left Arm, Patient Position: Sitting, Cuff Size: Normal)   Pulse 90   Resp 18   Ht 5' 4 (1.626 m)   Wt 168 lb (76.2 kg)   BMI 28.84 kg/m   Past Medical History:  Diagnosis Date   Acute posthemorrhagic anemia    Allergy 2015   Arthritis    Asthma 2008   Carotid artery stenosis    bilateral   Complication of anesthesia    c/o difficulty breathing after anesthesia   COPD (chronic obstructive pulmonary disease) (HCC)    Coronary artery disease    Diabetes mellitus without complication (HCC)    GERD (gastroesophageal reflux disease)    Hyperlipemia    Hypertension    Melena 08/21/2019   Paroxysmal supraventricular tachycardia 08/19/2019   Postural dizziness with presyncope 08/21/2019   Renal disorder    early stage CKD   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   Tobacco abuse    Ulcer    Upper GI bleed 08/21/2019    Social History   Socioeconomic History   Marital status: Single    Spouse name: Not on file   Number of children: Not on file  Years of education: Not on file   Highest education level: Professional school degree (e.g., MD, DDS, DVM, JD)  Occupational History   Not on file  Tobacco Use   Smoking status: Former    Current packs/day: 0.00    Average packs/day: 2.0 packs/day for 48.0 years (96.0 ttl pk-yrs)    Types: Cigarettes    Start date: 02/12/1972    Quit date: 02/12/2020    Years since quitting: 3.9   Smokeless tobacco: Never  Vaping Use   Vaping status: Never Used  Substance and Sexual Activity   Alcohol use: No    Comment: Never been a problem   Drug use: No   Sexual activity: Yes  Other Topics Concern   Not on file  Social History Narrative   Single, lives alone, unemployed.   Social Drivers of Health   Financial Resource Strain: Medium Risk (01/19/2024)   Overall Financial Resource Strain (CARDIA)    Difficulty of Paying Living Expenses: Somewhat hard  Food Insecurity: Food Insecurity Present (01/19/2024)    Hunger Vital Sign    Worried About Running Out of Food in the Last Year: Sometimes true    Ran Out of Food in the Last Year: Sometimes true  Transportation Needs: No Transportation Needs (01/19/2024)   PRAPARE - Administrator, Civil Service (Medical): No    Lack of Transportation (Non-Medical): No  Physical Activity: Insufficiently Active (01/19/2024)   Exercise Vital Sign    Days of Exercise per Week: 1 day    Minutes of Exercise per Session: 10 min  Stress: No Stress Concern Present (01/19/2024)   [employer] of Occupational Health - Occupational Stress Questionnaire    Feeling of Stress: Not at all  Social Connections: Unknown (01/19/2024)   Social Connection and Isolation Panel    Frequency of Communication with Friends and Family: Once a week    Frequency of Social Gatherings with Friends and Family: Patient declined    Attends Religious Services: Never    Database Administrator or Organizations: No    Attends Engineer, Structural: Not on file    Marital Status: Never married  Recent Concern: Social Connections - Moderately Isolated (10/31/2023)   Social Connection and Isolation Panel    Frequency of Communication with Friends and Family: Once a week    Frequency of Social Gatherings with Friends and Family: More than three times a week    Attends Religious Services: Never    Database Administrator or Organizations: Yes    Attends Banker Meetings: Never    Marital Status: Never married  Intimate Partner Violence: Not At Risk (10/31/2023)   Humiliation, Afraid, Rape, and Kick questionnaire    Fear of Current or Ex-Partner: No    Emotionally Abused: No    Physically Abused: No    Sexually Abused: No    Past Surgical History:  Procedure Laterality Date   BACK SURGERY     lumbar   COLONOSCOPY WITH PROPOFOL  N/A 08/24/2019   Procedure: COLONOSCOPY WITH PROPOFOL ;  Surgeon: Jinny Carmine, MD;  Location: [facility] ENDOSCOPY;  Service: Endoscopy;   Laterality: N/A;   ESOPHAGOGASTRODUODENOSCOPY (EGD) WITH PROPOFOL  N/A 08/24/2019   Procedure: ESOPHAGOGASTRODUODENOSCOPY (EGD) WITH PROPOFOL ;  Surgeon: Jinny Carmine, MD;  Location: [facility] ENDOSCOPY;  Service: Endoscopy;  Laterality: N/A;   SPINE SURGERY  2008   STENT PLACEMENT VASCULAR ([facility] HX)  07/2019   stenosis of distal splenic artery and stent placed   VISCERAL ANGIOGRAPHY N/A  08/06/2019   Procedure: VISCERAL ANGIOGRAPHY;  Surgeon: Jama Cordella MATSU, MD;  Location: [facility] INVASIVE CV LAB;  Service: Cardiovascular;  Laterality: N/A;    Family History  Problem Relation Age of Onset   Diabetes Mother    Diabetes Mellitus II Mother    Obesity Mother    Tuberculosis Father    Heart disease Father    Diabetes Father    Diabetes Sister    Diabetes Sister    Diabetes Sister    Diabetes Sister    Diabetes Brother    Diabetes Mellitus II Brother    Hyperlipidemia Brother    Obesity Brother    Cancer Maternal Aunt    Colon cancer Maternal Uncle    Obesity Sister    Obesity Sister    Obesity Sister    Obesity Sister    Prostate cancer Neg Hx     Allergies  Allergen Reactions   Wasp Venom Anaphylaxis   Wasp Venom Protein Anaphylaxis   Coffee Flavoring Agent (Non-Screening) Nausea And Vomiting    Patient states coffee gives him nausea and stomach cramps   Onion Nausea And Vomiting and Nausea Only   Prednisone  Other (See Comments)    DKA after IV solu medrol  following by oral prednionse  prednisone        Latest Ref Rng & Units 09/19/2023    1:38 AM 09/18/2023    1:36 PM 08/30/2023   11:34 AM  CBC  WBC 4.0 - 10.5 K/uL 8.3  7.2  6.2   Hemoglobin 13.0 - 17.0 g/dL 86.6  86.0  86.5   Hematocrit 39.0 - 52.0 % 42.0  42.5  43.1   Platelets 150 - 400 K/uL 406  397  382       CMP     Component Value Date/Time   NA 140 09/23/2023 1313   NA 137 02/23/2021 1531   NA 137 08/30/2013 2322   K 3.9 09/23/2023 1313   K 3.5 08/30/2013 2322   CL 102 09/23/2023 1313   CL 101  08/30/2013 2322   CO2 31 09/23/2023 1313   CO2 23 08/30/2013 2322   GLUCOSE 141 (H) 09/23/2023 1313   GLUCOSE 374 (H) 08/30/2013 2322   BUN 15 09/23/2023 1313   BUN 13 02/23/2021 1531   BUN 13 08/30/2013 2322   CREATININE 0.66 09/23/2023 1313   CREATININE 0.66 (L) 01/21/2020 1505   CALCIUM  10.2 09/23/2023 1313   CALCIUM  9.1 08/30/2013 2322   PROT 7.4 06/14/2023 2017   PROT 7.0 02/23/2021 1531   ALBUMIN 4.0 06/14/2023 2017   ALBUMIN 4.5 02/23/2021 1531   AST 19 06/14/2023 2017   ALT 20 06/14/2023 2017   ALKPHOS 64 06/14/2023 2017   BILITOT 0.6 06/14/2023 2017   BILITOT 0.4 02/23/2021 1531   GFR 103.81 09/23/2023 1313   EGFR 105 02/23/2021 1531   GFRNONAA >60 09/19/2023 0833   GFRNONAA >60 08/30/2013 2322     No results found.     Assessment & Plan:   1. Iliac artery dissection (Primary) In reviewing the CT as well as his ultrasound today he does indeed have a dissection of the left common iliac artery.  However it does not appear to be flow-limiting at this time.  In addition it also appears to be stable without any significant growth from his CT in July.  Based on this we will continue with monitoring.  We will evaluate again at his upcoming visit.  This should not pose any issues to his upcoming  surgery.  2. Primary hypertension Continue antihypertensive medications as already ordered, these medications have been reviewed and there are no changes at this time.  3. PAD (peripheral artery disease) This is stable.  He was just seen for this few months ago.  He does have an upcoming evaluation in the next few months for this.  4. Thrombosis of splenic artery (HCC) This was evaluated at his previous visit.  He will remain on annual follow-ups.  Currently no worsening symptoms for this.   Current Outpatient Medications on File Prior to Visit  Medication Sig Dispense Refill   acetaminophen  (TYLENOL ) 500 MG tablet Take 2 tablets (1,000 mg total) by mouth 3 (three) times  daily. 540 tablet 3   albuterol  (VENTOLIN  HFA) 108 (90 Base) MCG/ACT inhaler Inhale 2 puffs into the lungs every 6 (six) hours as needed for wheezing or shortness of breath. 8 g 2   aspirin  81 MG EC tablet Take 1 tablet (81 mg total) by mouth daily. 90 tablet 3   atorvastatin  (LIPITOR) 40 MG tablet Take 0.5 tablets (20 mg total) by mouth daily. 45 tablet 3   blood glucose meter kit and supplies Dispense based on patient and insurance preference. Use up to four times daily as directed. (FOR ICD-10 E10.9, E11.9). 1 each 0   Blood Glucose Monitoring Suppl DEVI 1 each by Does not apply route in the morning, at noon, and at bedtime. May substitute to any manufacturer covered by patient's insurance. 1 each 0   budesonide -glycopyrrolate -formoterol  (BREZTRI ) 160-9-4.8 MCG/ACT AERO inhaler Inhale 2 puffs into the lungs 2 (two) times daily. 10.7 g 1   clopidogrel  (PLAVIX ) 75 MG tablet Take 1 tablet by mouth once daily 90 tablet 0   Continuous Glucose Sensor (DEXCOM G7 SENSOR) MISC CHECK SUGARS CONTINUOUSLY 6 each 2   cyanocobalamin  (VITAMIN B12) 1000 MCG tablet Take 1,000 mcg by mouth daily.     digoxin  (LANOXIN ) 0.125 MG tablet Take 0.125 mg by mouth daily.     empagliflozin  (JARDIANCE ) 25 MG TABS tablet Take 1 tablet (25 mg total) by mouth daily before breakfast. 90 tablet 3   Ensifentrine  (OHTUVAYRE ) 3 MG/2.5ML SUSP Inhale 2.5 mLs into the lungs 2 (two) times daily.     famotidine  (PEPCID ) 20 MG tablet Take 1 tablet (20 mg total) by mouth at bedtime. 90 tablet 1   ipratropium-albuterol  (DUONEB) 0.5-2.5 (3) MG/3ML SOLN Take 3 mLs by nebulization every 6 (six) hours. 360 mL 1   midodrine  (PROAMATINE ) 10 MG tablet Take 10 mg by mouth in the morning.     Semaglutide , 2 MG/DOSE, (OZEMPIC , 2 MG/DOSE,) 8 MG/3ML SOPN INJECT 2MG  SUBCUTANEOUSLY ONCE WEEKLY 9 mL 0   silodosin  (RAPAFLO ) 8 MG CAPS capsule TAKE 1 CAPSULE BY MOUTH ONCE DAILY WITH BREAKFAST 30 capsule 0   triamcinolone  cream (KENALOG ) 0.1 % Apply 1  application topically 2 (two) times daily. 30 g 0   No current facility-administered medications on file prior to visit.    There are no Patient Instructions on file for this visit. No follow-ups on file.   Mykiah Schmuck E Serapio Edelson, NP

## 2024-01-19 NOTE — Telephone Encounter (Signed)
 Clearance form placed in provider folder

## 2024-01-21 NOTE — Addendum Note (Signed)
 Addended by: Khiara Shuping E on: 01/21/2024 08:17 AM   Modules accepted: Orders

## 2024-01-23 ENCOUNTER — Encounter: Payer: Self-pay | Admitting: Family

## 2024-01-23 ENCOUNTER — Ambulatory Visit (INDEPENDENT_AMBULATORY_CARE_PROVIDER_SITE_OTHER): Admitting: Family

## 2024-01-23 VITALS — BP 102/60 | HR 87 | Temp 98.7°F | Ht 64.0 in | Wt 156.0 lb

## 2024-01-23 DIAGNOSIS — E119 Type 2 diabetes mellitus without complications: Secondary | ICD-10-CM

## 2024-01-23 DIAGNOSIS — I4711 Inappropriate sinus tachycardia, so stated: Secondary | ICD-10-CM

## 2024-01-23 DIAGNOSIS — Z7985 Long-term (current) use of injectable non-insulin antidiabetic drugs: Secondary | ICD-10-CM

## 2024-01-23 NOTE — Patient Instructions (Signed)
 As discussed at length, as we move toward surgery and particularly holding Ozempic  and Jardiance  per instructions, I worry about blood sugar being high and after surgery blood sugar could be high or low.  It is imperative that we stay in close touch.  I provided you with samples of the libre sensor to wear leading up to surgery and after.     Please call me with blood sugars greater than 250 or less than 70.

## 2024-01-23 NOTE — Progress Notes (Unsigned)
 Assessment & Plan:  There are no diagnoses linked to this encounter.   Return precautions given.   Risks, benefits, and alternatives of the medications and treatment plan prescribed today were discussed, and patient expressed understanding.   Education regarding symptom management and diagnosis given to patient on AVS either electronically or printed.  No follow-ups on file.  Rollene Northern, [name]  Subjective:    Patient ID: Benjamin Conley, male    DOB: 1966-02-10, 58 y.o.   MRN: [id number]  CC: Benjamin Conley is a 58 y.o. male who presents today for preop evaluation, follow up.   HPI: HPI Discussed the use of AI scribe software for clinical note transcription with the patient, who gave verbal consent to proceed.  History of Present Illness   Benjamin Conley is a 58 year old male who presents for preoperative evaluation for cervical fusion surgery.  He has been experiencing chronic neck pain for years, which was initially suspected to be cardiac or COPD-related, but he reports that specialists now believe it may be related to a compressed spinal cord.  He has had two recent falls, one attributed to tripping over debris and another while moving a toy car with a child. He did not seek emergency care after the second fall as he did not feel it was necessary. He denies hitting his head during the recent falls and reports no loss of consciousness.  He has a history of peripheral neuropathy, characterized by numbness in his feet, which could be contributing to his falls, especially on uneven ground.  He is currently on Plavix , a blood thinner managed by his vein and vascular specialist, and digoxin , prescribed in August, though he has not yet had the tox level checked. He is managing his diabetes with a glucometer and is concerned about blood sugar levels, especially with the upcoming surgery. He reports fluctuations in his blood sugar, noting that it tends to rise when he eats  two or three meals a day. He has lost some weight recently, which he attributes to his current medication regimen.  He is undergoing physical therapy for his shoulders but has not yet started therapy focused on lower extremity strength or balance.      Compliant with Jardiance  25 mg daily, Ozempic  2 mg.   cardiology appointment in 02/03/2024. Planning anterior cervical discectomy and fusion 02/17/2024 with Dr. Penne Sharps. Neurosurgery instructed to hold Plavix  for 7 days and resume 14 days after.  Stop Ozempic  7 days prior to surgery, hold Jardiance  3 days prior to surgery.  Advised okay to stay on aspirin  81 mg.   Allergies: Wasp venom, Wasp venom protein, Coffee flavoring agent (non-screening), Onion, and Prednisone  Current Outpatient Medications on File Prior to Visit  Medication Sig Dispense Refill   acetaminophen  (TYLENOL ) 500 MG tablet Take 2 tablets (1,000 mg total) by mouth 3 (three) times daily. 540 tablet 3   albuterol  (VENTOLIN  HFA) 108 (90 Base) MCG/ACT inhaler Inhale 2 puffs into the lungs every 6 (six) hours as needed for wheezing or shortness of breath. 8 g 2   aspirin  81 MG EC tablet Take 1 tablet (81 mg total) by mouth daily. 90 tablet 3   atorvastatin  (LIPITOR) 40 MG tablet Take 0.5 tablets (20 mg total) by mouth daily. 45 tablet 3   blood glucose meter kit and supplies Dispense based on patient and insurance preference. Use up to four times daily as directed. (FOR ICD-10 E10.9, E11.9). 1 each 0   Blood Glucose  Monitoring Suppl DEVI 1 each by Does not apply route in the morning, at noon, and at bedtime. May substitute to any manufacturer covered by patient's insurance. 1 each 0   budesonide -glycopyrrolate -formoterol  (BREZTRI ) 160-9-4.8 MCG/ACT AERO inhaler Inhale 2 puffs into the lungs 2 (two) times daily. 10.7 g 1   clopidogrel  (PLAVIX ) 75 MG tablet Take 1 tablet by mouth once daily 90 tablet 0   Continuous Glucose Sensor (DEXCOM G7 SENSOR) MISC CHECK SUGARS CONTINUOUSLY 6  each 2   cyanocobalamin  (VITAMIN B12) 1000 MCG tablet Take 1,000 mcg by mouth daily.     digoxin  (LANOXIN ) 0.125 MG tablet Take 0.125 mg by mouth daily.     empagliflozin  (JARDIANCE ) 25 MG TABS tablet Take 1 tablet (25 mg total) by mouth daily before breakfast. 90 tablet 3   Ensifentrine  (OHTUVAYRE ) 3 MG/2.5ML SUSP Inhale 2.5 mLs into the lungs 2 (two) times daily.     famotidine  (PEPCID ) 20 MG tablet Take 1 tablet (20 mg total) by mouth at bedtime. 90 tablet 1   ipratropium-albuterol  (DUONEB) 0.5-2.5 (3) MG/3ML SOLN Take 3 mLs by nebulization every 6 (six) hours. 360 mL 1   midodrine  (PROAMATINE ) 10 MG tablet Take 10 mg by mouth in the morning.     Semaglutide , 2 MG/DOSE, (OZEMPIC , 2 MG/DOSE,) 8 MG/3ML SOPN INJECT 2MG  SUBCUTANEOUSLY ONCE WEEKLY 9 mL 0   silodosin  (RAPAFLO ) 8 MG CAPS capsule TAKE 1 CAPSULE BY MOUTH ONCE DAILY WITH BREAKFAST 30 capsule 0   triamcinolone  cream (KENALOG ) 0.1 % Apply 1 application topically 2 (two) times daily. 30 g 0   No current facility-administered medications on file prior to visit.    Review of Systems    Objective:    BP 102/60   Pulse 87   Temp 98.7 F (37.1 C) (Oral)   Ht 5' 4 (1.626 m)   Wt 156 lb (70.8 kg)   SpO2 99%   BMI 26.78 kg/m  BP Readings from Last 3 Encounters:  01/23/24 102/60  01/19/24 137/89  01/14/24 (!) 140/88   Wt Readings from Last 3 Encounters:  01/23/24 156 lb (70.8 kg)  01/19/24 168 lb (76.2 kg)  01/14/24 160 lb (72.6 kg)    Physical Exam

## 2024-01-26 ENCOUNTER — Other Ambulatory Visit (HOSPITAL_COMMUNITY): Payer: Self-pay

## 2024-01-26 LAB — DIGOXIN LEVEL

## 2024-01-26 LAB — EXTRA SPECIMEN

## 2024-01-28 ENCOUNTER — Encounter: Payer: Self-pay | Admitting: Family

## 2024-01-28 NOTE — Assessment & Plan Note (Signed)
 Lab Results  Component Value Date   HGBA1C 7.4 (A) 12/19/2023   Chronic, uncontrolled.  Discussed the importance of glycemic control due to risk of infection, poor wound healing as he approaches surgery. He will work on dietary measures.  Continue Jardiance  25 mg daily, Ozempic  2 mg.  Discussed calling me if blood sugars greater than 250 or less than 70 so to use insulin  ahead of surgery if needed due to holding Jardiance , Ozempic  per surgery.  provided samples of CGM to use leading up to and after surgery for closer monitoring of blood glucose. Pre op clearance required by cardiology as well; appointment as scheduled.  Pre op clearance has been signed by me.

## 2024-01-29 ENCOUNTER — Ambulatory Visit: Payer: Self-pay | Admitting: Family

## 2024-01-29 NOTE — Telephone Encounter (Signed)
 Noted

## 2024-01-30 ENCOUNTER — Ambulatory Visit: Admitting: Family

## 2024-01-30 NOTE — Telephone Encounter (Signed)
 Printed data placed on provider desk

## 2024-02-03 ENCOUNTER — Ambulatory Visit: Admitting: Cardiology

## 2024-02-03 DIAGNOSIS — Z01810 Encounter for preprocedural cardiovascular examination: Secondary | ICD-10-CM | POA: Diagnosis not present

## 2024-02-03 DIAGNOSIS — Z01818 Encounter for other preprocedural examination: Secondary | ICD-10-CM

## 2024-02-03 NOTE — Progress Notes (Signed)
 Virtual Visit via Telephone Note   Because of Benjamin Conley co-morbid illnesses, he is at least at moderate risk for complications without adequate follow up.  This format is felt to be most appropriate for this patient at this time.  Due to technical limitations with video connection (technology), today's appointment will be conducted as an audio only telehealth visit, and [facility] verbally agreed to proceed in this manner.   All issues noted in this document were discussed and addressed.  No physical exam could be performed with this format.  Evaluation Performed:  Preoperative cardiovascular risk assessment _____________   Date:  02/03/2024   Patient ID:  Benjamin Conley, DOB 1966-03-10, MRN [id number] Patient Location:  Home Provider location:   Office  Primary Care Provider:  Dineen Rollene MATSU, [name] Primary Cardiologist:  Redell Cave, MD  Chief Complaint / Patient Profile   58 y.o. y/o male with a h/o DM2, orthostatic hypotension, splenic infarct s/p stent in 2021, COPD, dyslipidemia, tobacco abuse who is pending ACDF and presents today for telephonic preoperative cardiovascular risk assessment.  History of Present Illness    Benjamin Conley is a 58 y.o. male who presents via audio/video conferencing for a telehealth visit today.  Pt was last seen in cardiology clinic on 10/30/2023 by Dr. Cave.  At that time Benjamin Conley was doing well from a cardiac perspective and advised he can follow-up in a year.  The patient is now pending procedure as outlined above. Since his last visit, he has been doing stable he is able to stay busy around his house, completing his inside and outside chores without any anginal complaints.  Breathing is at baseline. He denies chest pain, palpitations, dyspnea, pnd, orthopnea, n, v, dizziness, syncope, edema, weight gain, or early satiety.     Past Medical History    Past Medical History:  Diagnosis Date   Acute  posthemorrhagic anemia    Allergy 2015   Arthritis    Asthma 2008   Carotid artery stenosis    bilateral   Complication of anesthesia    c/o difficulty breathing after anesthesia   COPD (chronic obstructive pulmonary disease) (HCC)    Coronary artery disease    Diabetes mellitus without complication (HCC)    GERD (gastroesophageal reflux disease)    Hyperlipemia    Hypertension    Melena 08/21/2019   Paroxysmal supraventricular tachycardia 08/19/2019   Postural dizziness with presyncope 08/21/2019   Renal disorder    early stage CKD   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   Tobacco abuse    Ulcer    Upper GI bleed 08/21/2019   Past Surgical History:  Procedure Laterality Date   BACK SURGERY     lumbar   COLONOSCOPY WITH PROPOFOL  N/A 08/24/2019   Procedure: COLONOSCOPY WITH PROPOFOL ;  Surgeon: Jinny Carmine, MD;  Location: [facility] ENDOSCOPY;  Service: Endoscopy;  Laterality: N/A;   ESOPHAGOGASTRODUODENOSCOPY (EGD) WITH PROPOFOL  N/A 08/24/2019   Procedure: ESOPHAGOGASTRODUODENOSCOPY (EGD) WITH PROPOFOL ;  Surgeon: Jinny Carmine, MD;  Location: [facility] ENDOSCOPY;  Service: Endoscopy;  Laterality: N/A;   SPINE SURGERY  2008   STENT PLACEMENT VASCULAR ([facility] HX)  07/2019   stenosis of distal splenic artery and stent placed   VISCERAL ANGIOGRAPHY N/A 08/06/2019   Procedure: VISCERAL ANGIOGRAPHY;  Surgeon: Jama Cordella MATSU, MD;  Location: [facility] INVASIVE CV LAB;  Service: Cardiovascular;  Laterality: N/A;    Allergies  Allergies  Allergen Reactions   Wasp Venom Anaphylaxis   Wasp Venom Protein  Anaphylaxis   Coffee Flavoring Agent (Non-Screening) Nausea And Vomiting    Patient states coffee gives him nausea and stomach cramps   Onion Nausea And Vomiting and Nausea Only   Prednisone  Other (See Comments)    DKA after IV solu medrol  following by oral prednionse  prednisone     Home Medications    Prior to Admission medications   Medication Sig Start Date End Date Taking? Authorizing Provider   acetaminophen  (TYLENOL ) 500 MG tablet Take 2 tablets (1,000 mg total) by mouth 3 (three) times daily. Patient taking differently: Take 1,000 mg by mouth in the morning. 12/19/23   Dineen Rollene MATSU, [name]  albuterol  (VENTOLIN  HFA) 108 (90 Base) MCG/ACT inhaler Inhale 2 puffs into the lungs every 6 (six) hours as needed for wheezing or shortness of breath. 09/03/23   Isaiah Scrivener, MD  aspirin  81 MG EC tablet Take 1 tablet (81 mg total) by mouth daily. 10/16/20   Marylynn Verneita CROME, MD  atorvastatin  (LIPITOR) 40 MG tablet Take 0.5 tablets (20 mg total) by mouth daily. 01/09/24   Darliss Rogue, MD  blood glucose meter kit and supplies Dispense based on patient and insurance preference. Use up to four times daily as directed. (FOR ICD-10 E10.9, E11.9). 01/28/20   Moishe Suzen LABOR, NP  Blood Glucose Monitoring Suppl DEVI 1 each by Does not apply route in the morning, at noon, and at bedtime. May substitute to any manufacturer covered by patient's insurance. 12/10/23   Dineen Rollene MATSU, [name]  budesonide -glycopyrrolate -formoterol  (BREZTRI ) 160-9-4.8 MCG/ACT AERO inhaler Inhale 2 puffs into the lungs 2 (two) times daily. Patient taking differently: Inhale 2 puffs into the lungs 2 (two) times daily as needed (respiratory issues.). 09/19/23   Laurita Cort DASEN, MD  clopidogrel  (PLAVIX ) 75 MG tablet Take 1 tablet by mouth once daily 11/14/23   Brown, Fallon E, NP  Continuous Glucose Sensor (DEXCOM G7 SENSOR) MISC CHECK SUGARS CONTINUOUSLY 11/26/23   Dineen Rollene MATSU, [name]  digoxin  (LANOXIN ) 0.125 MG tablet Take 0.125 mg by mouth in the morning. 01/11/24   [provider]  empagliflozin  (JARDIANCE ) 25 MG TABS tablet Take 1 tablet (25 mg total) by mouth daily before breakfast. 12/02/22   Dineen Rollene MATSU, [name]  Ensifentrine  (OHTUVAYRE ) 3 MG/2.5ML SUSP Inhale 2.5 mLs into the lungs 2 (two) times daily. Patient not taking: Reported on 01/29/2024 10/10/23   Malachy Comer GAILS, NP  famotidine  (PEPCID ) 20 MG tablet Take  1 tablet (20 mg total) by mouth at bedtime. Patient not taking: Reported on 01/29/2024 06/12/23   Dineen Rollene MATSU, [name]  ipratropium-albuterol  (DUONEB) 0.5-2.5 (3) MG/3ML SOLN Take 3 mLs by nebulization every 6 (six) hours. Patient not taking: Reported on 01/29/2024 09/19/23   Laurita Cort DASEN, MD  midodrine  (PROAMATINE ) 10 MG tablet Take 10 mg by mouth in the morning. 03/10/23   [provider]  Semaglutide , 2 MG/DOSE, (OZEMPIC , 2 MG/DOSE,) 8 MG/3ML SOPN INJECT 2MG  SUBCUTANEOUSLY ONCE WEEKLY Patient taking differently: Inject 2 mg into the skin every Monday at 6 PM. INJECT 2MG  SUBCUTANEOUSLY ONCE WEEKLY 12/10/23   Dineen Rollene MATSU, [name]  silodosin  (RAPAFLO ) 8 MG CAPS capsule TAKE 1 CAPSULE BY MOUTH ONCE DAILY WITH BREAKFAST Patient not taking: Reported on 01/29/2024 08/14/23   Helon Kirsch A, PA-C  triamcinolone  cream (KENALOG ) 0.1 % Apply 1 application topically 2 (two) times daily. Patient taking differently: Apply 1 application  topically 2 (two) times daily as needed (eczema). 03/08/21   Flinchum, Rosaline RAMAN, [name]    Physical Exam  Vital Signs:  [facility] does not have vital signs available for review today.  Given telephonic nature of communication, physical exam is limited. AAOx3. NAD. Normal affect.  Speech and respirations are unlabored.  Accessory Clinical Findings    None  Assessment & Plan    1.  Preoperative Cardiovascular Risk Assessment:    Benjamin Conley perioperative risk of a major cardiac event is 0.4% according to the Revised Cardiac Risk Index (RCRI).  Therefore, he is at low risk for perioperative complications.   His functional capacity is fair at 4.4 METs according to the [place] Activity Status Index (DASI). Recommendations: According to ACC/AHA guidelines, no further cardiovascular testing needed.  The patient may proceed to surgery at acceptable risk.   Antiplatelet and/or Anticoagulation Recommendations:  Request to hold Plavix  should come  from his prescribing provider, [facility].     The patient was advised that if he develops new symptoms prior to surgery to contact our office to arrange for a follow-up visit, and he verbalized understanding.    A copy of this note will be routed to requesting surgeon.  Time:   Today, I have spent 10 minutes with the patient with telehealth technology discussing medical history, symptoms, and management plan.     Delon JAYSON Hoover, NP  02/03/2024, 8:12 AM

## 2024-02-04 ENCOUNTER — Other Ambulatory Visit: Payer: Self-pay

## 2024-02-04 ENCOUNTER — Encounter
Admission: RE | Admit: 2024-02-04 | Discharge: 2024-02-04 | Disposition: A | Discharge status: Home / Self Care | Source: Ambulatory Visit | Attending: Neurosurgery | Admitting: Neurosurgery

## 2024-02-04 VITALS — BP 126/77 | HR 101 | Temp 97.8°F | Resp 16 | Ht 65.0 in | Wt 159.0 lb

## 2024-02-04 DIAGNOSIS — Z01818 Encounter for other preprocedural examination: Secondary | ICD-10-CM | POA: Diagnosis present

## 2024-02-04 DIAGNOSIS — J441 Chronic obstructive pulmonary disease with (acute) exacerbation: Secondary | ICD-10-CM | POA: Diagnosis not present

## 2024-02-04 DIAGNOSIS — E119 Type 2 diabetes mellitus without complications: Secondary | ICD-10-CM | POA: Insufficient documentation

## 2024-02-04 DIAGNOSIS — Z01812 Encounter for preprocedural laboratory examination: Secondary | ICD-10-CM

## 2024-02-04 LAB — TYPE AND SCREEN
ABO/RH(D): O POS
Antibody Screen: NEGATIVE

## 2024-02-04 LAB — CBC
HCT: 35.5 % — ABNORMAL LOW (ref 39.0–52.0)
Hemoglobin: 11.5 g/dL — ABNORMAL LOW (ref 13.0–17.0)
MCH: 27.8 pg (ref 26.0–34.0)
MCHC: 32.4 g/dL (ref 30.0–36.0)
MCV: 86 fL (ref 80.0–100.0)
Platelets: 432 K/uL — ABNORMAL HIGH (ref 150–400)
RBC: 4.13 MIL/uL — ABNORMAL LOW (ref 4.22–5.81)
RDW: 14 % (ref 11.5–15.5)
WBC: 6.1 K/uL (ref 4.0–10.5)
nRBC: 0 % (ref 0.0–0.2)

## 2024-02-04 LAB — SURGICAL PCR SCREEN
MRSA, PCR: NEGATIVE
Staphylococcus aureus: NEGATIVE

## 2024-02-04 LAB — BASIC METABOLIC PANEL WITH GFR
Anion gap: 10 (ref 5–15)
BUN: 15 mg/dL (ref 6–20)
CO2: 24 mmol/L (ref 22–32)
Calcium: 9 mg/dL (ref 8.9–10.3)
Chloride: 105 mmol/L (ref 98–111)
Creatinine, Ser: 0.66 mg/dL (ref 0.61–1.24)
GFR, Estimated: >60 mL/min (ref >60)
Glucose, Bld: 205 mg/dL — ABNORMAL HIGH (ref 70–99)
Potassium: 3.9 mmol/L (ref 3.5–5.1)
Sodium: 139 mmol/L (ref 135–145)

## 2024-02-04 NOTE — Patient Instructions (Addendum)
 Your procedure is scheduled on: Tuesday 02/17/24 Report to the Registration Desk on the 1st floor of the [facility]. To find out your arrival time, please call [phone] between 1PM - 3PM on: Monday 02/16/24 If your arrival time is 6:00 am, do not arrive before that time as the [facility] entrance doors do not open until 6:00 am.  REMEMBER: Instructions that are not followed completely may result in serious medical risk, up to and including death; or upon the discretion of your surgeon and anesthesiologist your surgery may need to be rescheduled.  Do not eat food after midnight the night before surgery.  No gum chewing or hard candies.  You may however, drink CLEAR liquids up to 2 hours before you are scheduled to arrive for your surgery. Do not drink anything within 2 hours of your scheduled arrival time.  Clear liquids include: - water  Do NOT drink anything that is not on this list.   One week prior to surgery: Stop Anti-inflammatories (NSAIDS) such as Advil, Aleve, Ibuprofen, Motrin, Naproxen, Naprosyn and Aspirin  based products such as Excedrin, Goody's Powder, BC Powder. Stop ANY OVER THE COUNTER supplements until after surgery.  You may however, continue to take Tylenol  if needed for pain up until the day of surgery.  Stop clopidogrel  (PLAVIX ) 75 MG as instructed by your doctor. 7 days prior to surgery as instructed by your doctor (Take last dose 02/09/24)  Stop empagliflozin  (JARDIANCE ) 25 MG 3 days prior to surgery (take last dose Friday 02/13/24) Stop Semaglutide , 2 MG/DOSE, (OZEMPIC , 2 MG/DOSE,) 8 MG/3ML SOPN 1 week prior to surgery (take last dose 02/09/24)  Continue taking all of your other prescription medications up until the day of surgery.  ON THE DAY OF SURGERY ONLY TAKE THESE MEDICATIONS WITH SIPS OF WATER:  midodrine  (PROAMATINE ) 10 MG   Use inhalers on the day of surgery and bring to the hospital. albuterol  (VENTOLIN  HFA) 108 (90 Base) MCG/ACT inhaler   budesonide -glycopyrrolate -formoterol  (BREZTRI ) 160-9-4.8 MCG/ACT AERO inhaler   No Alcohol for 24 hours before or after surgery.  No Smoking including e-cigarettes for 24 hours before surgery.  No chewable tobacco products for at least 6 hours before surgery.  No nicotine  patches on the day of surgery.  Do not use any recreational drugs for at least a week (preferably 2 weeks) before your surgery.  Please be advised that the combination of cocaine and anesthesia may have negative outcomes, up to and including death. If you test positive for cocaine, your surgery will be cancelled.  On the morning of surgery brush your teeth with toothpaste and water, you may rinse your mouth with mouthwash if you wish. Do not swallow any toothpaste or mouthwash.  Use CHG Soap or wipes as directed on instruction sheet.  Do not wear jewelry, make-up, hairpins, clips or nail polish.  For welded (permanent) jewelry: bracelets, anklets, waist bands, etc.  Please have this removed prior to surgery.  If it is not removed, there is a chance that hospital personnel will need to cut it off on the day of surgery.  Do not wear lotions, powders, or perfumes.   Do not shave body hair from the neck down 48 hours before surgery.  Contact lenses, hearing aids and dentures may not be worn into surgery.  Do not bring valuables to the hospital. [facility] is not responsible for any missing/lost belongings or valuables.   Notify your doctor if there is any change in your medical condition (cold, fever,  infection).  Wear comfortable clothing (specific to your surgery type) to the hospital.  After surgery, you can help prevent lung complications by doing breathing exercises.  Take deep breaths and cough every 1-2 hours. Your doctor may order a device called an Incentive Spirometer to help you take deep breaths. When coughing or sneezing, hold a pillow firmly against your incision with both hands. This is called  "splinting." Doing this helps protect your incision. It also decreases belly discomfort.  If you are being admitted to the hospital overnight, leave your suitcase in the car. After surgery it may be brought to your room.  In case of increased patient census, it may be necessary for you, the patient, to continue your postoperative care in the Same Day Surgery department.  If you are being discharged the day of surgery, you will not be allowed to drive home. You will need a responsible individual to drive you home and stay with you for 24 hours after surgery.   If you are taking public transportation, you will need to have a responsible individual with you.  Please call the Pre-admissions Testing Dept. at [phone] if you have any questions about these instructions.  Surgery Visitation Policy:  Patients having surgery or a procedure may have two visitors.  Children under the age of 75 must have an adult with them who is not the patient.  Inpatient Visitation:    Visiting hours are 7 a.m. to 8 p.m. Up to four visitors are allowed at one time in a patient room. The visitors may rotate out with other people during the day.  One visitor age 70 or older may stay with the patient overnight and must be in the room by 8 p.m.   Merchandiser, Retail to address health-related social needs:  [email or web address]    Pre-operative 4 CHG Bath Instructions   You can play a key role in reducing the risk of infection after surgery. Your skin needs to be as free of germs as possible. You can reduce the number of germs on your skin by washing with CHG (chlorhexidine gluconate) soap before surgery. CHG is an antiseptic soap that kills germs and continues to kill germs even after washing.   DO NOT use if you have an allergy to chlorhexidine/CHG or antibacterial soaps. If your skin becomes reddened or irritated, stop using the CHG and notify one of our RNs at [phone].   Please  shower with the CHG soap starting 4 days before surgery using the following schedule:     Please keep in mind the following:  DO NOT shave, including legs and underarms, starting the day of your first shower.   You may shave your face at any point before/day of surgery.  Place clean sheets on your bed the day you start using CHG soap. Use a clean washcloth (not used since being washed) for each shower. DO NOT sleep with pets once you start using the CHG.   CHG Shower Instructions:  If you choose to wash your hair and private area, wash first with your normal shampoo/soap.  After you use shampoo/soap, rinse your hair and body thoroughly to remove shampoo/soap residue.  Turn the water OFF and apply about 3 tablespoons (45 ml) of CHG soap to a CLEAN washcloth.  Apply CHG soap ONLY FROM YOUR NECK DOWN TO YOUR TOES (washing for 3-5 minutes)  DO NOT use CHG soap on face, private areas, open wounds, or sores.  Pay special attention to the area  where your surgery is being performed.  If you are having back surgery, having someone wash your back for you may be helpful. Wait 2 minutes after CHG soap is applied, then you may rinse off the CHG soap.  Pat dry with a clean towel  Put on clean clothes/pajamas   If you choose to wear lotion, please use ONLY the CHG-compatible lotions on the back of this paper.     Additional instructions for the day of surgery: DO NOT APPLY any lotions, deodorants, cologne, or perfumes.   Put on clean/comfortable clothes.  Brush your teeth.  Ask your nurse before applying any prescription medications to the skin.      CHG Compatible Lotions   Aveeno Moisturizing lotion  Cetaphil Moisturizing Cream  Cetaphil Moisturizing Lotion  Clairol Herbal Essence Moisturizing Lotion, Dry Skin  Clairol Herbal Essence Moisturizing Lotion, Extra Dry Skin  Clairol Herbal Essence Moisturizing Lotion, Normal Skin  Curel Age Defying Therapeutic Moisturizing Lotion with Alpha  Hydroxy  Curel Extreme Care Body Lotion  Curel Soothing Hands Moisturizing Hand Lotion  Curel Therapeutic Moisturizing Cream, Fragrance-Free  Curel Therapeutic Moisturizing Lotion, Fragrance-Free  Curel Therapeutic Moisturizing Lotion, Original Formula  Eucerin Daily Replenishing Lotion  Eucerin Dry Skin Therapy Plus Alpha Hydroxy Crme  Eucerin Dry Skin Therapy Plus Alpha Hydroxy Lotion  Eucerin Original Crme  Eucerin Original Lotion  Eucerin Plus Crme Eucerin Plus Lotion  Eucerin TriLipid Replenishing Lotion  Keri Anti-Bacterial Hand Lotion  Keri Deep Conditioning Original Lotion Dry Skin Formula Softly Scented  Keri Deep Conditioning Original Lotion, Fragrance Free Sensitive Skin Formula  Keri Lotion Fast Absorbing Fragrance Free Sensitive Skin Formula  Keri Lotion Fast Absorbing Softly Scented Dry Skin Formula  Keri Original Lotion  Keri Skin Renewal Lotion Keri Silky Smooth Lotion  Keri Silky Smooth Sensitive Skin Lotion  Nivea Body Creamy Conditioning Oil  Nivea Body Extra Enriched Lotion  Nivea Body Original Lotion  Nivea Body Sheer Moisturizing Lotion Nivea Crme  Nivea Skin Firming Lotion  NutraDerm 30 Skin Lotion  NutraDerm Skin Lotion  NutraDerm Therapeutic Skin Cream  NutraDerm Therapeutic Skin Lotion  ProShield Protective Hand Cream  Provon moisturizing lotion  How to Use an Incentive Spirometer  An incentive spirometer is a tool that measures how well you are filling your lungs with each breath. Learning to take long, deep breaths using this tool can help you keep your lungs clear and active. This may help to reverse or lessen your chance of developing breathing (pulmonary) problems, especially infection. You may be asked to use a spirometer: After a surgery. If you have a lung problem or a history of smoking. After a long period of time when you have been unable to move or be active. If the spirometer includes an indicator to show the highest number that you  have reached, your health care provider or respiratory therapist will help you set a goal. Keep a log of your progress as told by your health care provider. What are the risks? Breathing too quickly may cause dizziness or cause you to pass out. Take your time so you do not get dizzy or light-headed. If you are in pain, you may need to take pain medicine before doing incentive spirometry. It is harder to take a deep breath if you are having pain. How to use your incentive spirometer  Sit up on the edge of your bed or on a chair. Hold the incentive spirometer so that it is in an upright position. Before you use  the spirometer, breathe out normally. Place the mouthpiece in your mouth. Make sure your lips are closed tightly around it. Breathe in slowly and as deeply as you can through your mouth, causing the piston or the ball to rise toward the top of the chamber. Hold your breath for 3-5 seconds, or for as long as possible. If the spirometer includes a coach indicator, use this to guide you in breathing. Slow down your breathing if the indicator goes above the marked areas. Remove the mouthpiece from your mouth and breathe out normally. The piston or ball will return to the bottom of the chamber. Rest for a few seconds, then repeat the steps 10 or more times. Take your time and take a few normal breaths between deep breaths so that you do not get dizzy or light-headed. Do this every 1-2 hours when you are awake. If the spirometer includes a goal marker to show the highest number you have reached (best effort), use this as a goal to work toward during each repetition. After each set of 10 deep breaths, cough a few times. This will help to make sure that your lungs are clear. If you have an incision on your chest or abdomen from surgery, place a pillow or a rolled-up towel firmly against the incision when you cough. This can help to reduce pain while taking deep breaths and coughing. General  tips When you are able to get out of bed: Walk around often. Continue to take deep breaths and cough in order to clear your lungs. Keep using the incentive spirometer until your health care provider says it is okay to stop using it. If you have been in the hospital, you may be told to keep using the spirometer at home. Contact a health care provider if: You are having difficulty using the spirometer. You have trouble using the spirometer as often as instructed. Your pain medicine is not giving enough relief for you to use the spirometer as told. You have a fever. Get help right away if: You develop shortness of breath. You develop a cough with bloody mucus from the lungs. You have fluid or blood coming from an incision site after you cough. Summary An incentive spirometer is a tool that can help you learn to take long, deep breaths to keep your lungs clear and active. You may be asked to use a spirometer after a surgery, if you have a lung problem or a history of smoking, or if you have been inactive for a long period of time. Use your incentive spirometer as instructed every 1-2 hours while you are awake. If you have an incision on your chest or abdomen, place a pillow or a rolled-up towel firmly against your incision when you cough. This will help to reduce pain. Get help right away if you have shortness of breath, you cough up bloody mucus, or blood comes from your incision when you cough. This information is not intended to replace advice given to you by your health care provider. Make sure you discuss any questions you have with your health care provider. Document Revised: 05/17/2019 Document Reviewed: 05/17/2019 Elsevier Patient Education  2023 [employer].

## 2024-02-06 ENCOUNTER — Other Ambulatory Visit (INDEPENDENT_AMBULATORY_CARE_PROVIDER_SITE_OTHER): Payer: Self-pay | Admitting: Nurse Practitioner

## 2024-02-06 ENCOUNTER — Other Ambulatory Visit: Payer: Self-pay | Admitting: Family

## 2024-02-06 DIAGNOSIS — D735 Infarction of spleen: Secondary | ICD-10-CM

## 2024-02-06 DIAGNOSIS — E119 Type 2 diabetes mellitus without complications: Secondary | ICD-10-CM

## 2024-02-10 ENCOUNTER — Other Ambulatory Visit: Payer: Self-pay

## 2024-02-10 DIAGNOSIS — R972 Elevated prostate specific antigen [PSA]: Secondary | ICD-10-CM

## 2024-02-11 ENCOUNTER — Other Ambulatory Visit

## 2024-02-11 DIAGNOSIS — R972 Elevated prostate specific antigen [PSA]: Secondary | ICD-10-CM

## 2024-02-12 ENCOUNTER — Ambulatory Visit: Payer: Self-pay | Admitting: Urology

## 2024-02-12 ENCOUNTER — Encounter: Payer: Self-pay | Admitting: Neurosurgery

## 2024-02-12 LAB — PSA, TOTAL AND FREE
PSA, Free Pct: 13.2 %
PSA, Free: 0.75 ng/mL
Prostate Specific Ag, Serum: 5.7 ng/mL — ABNORMAL HIGH (ref 0.0–4.0)

## 2024-02-12 NOTE — Progress Notes (Signed)
 Perioperative / Anesthesia Services  Pre-Admission Testing Clinical Review / Pre-Operative Anesthesia Consult  Date: 02/12/24  PATIENT DEMOGRAPHICS: Name: Benjamin Conley DOB: [birth year] MRN:   [id number]  Note: Available PAT nursing documentation and vital signs have been reviewed. Clinical nursing staff has updated patient's PMH/PSHx, current medication list, and drug allergies/intolerances to ensure complete and comprehensive history available to assist care teams in MDM as it pertains to the aforementioned surgical procedure and anticipated anesthetic course. Extensive review of available clinical information personally performed. Nursing documentation reviewed. [place] PMH and PSHx updated with any diagnoses and/or procedures that I have knowledge of that may have been inadvertently omitted during his intake with the pre-admission testing department's nursing staff.  PLANNED SURGICAL PROCEDURE(S):   Case: [id number] Date/Time: 02/17/24 1404   Procedure: Anterior cervical discectomy and fusion, C4-C6   Anesthesia type: General   Diagnosis:      Cervical spondylosis [M47.812]     Spinal stenosis in cervical region [M48.02]     Spondylosis, cervical, with myelopathy [M47.12]   Pre-op diagnosis: Cervical spondylosis with myelopathy   Location: [facility] OR ROOM 03 / [facility]   Surgeons: Claudene Penne ORN, MD        CLINICAL DISCUSSION: Benjamin Conley is a 58 y.o. male who is submitted for pre-surgical anesthesia review and clearance prior to him undergoing the above procedure. Patient is a Former Smoker (96 pack years; quit 02/2020). Pertinent PMH includes: CAD, PSVT, PAD (splenic artery thrombosis + iliac artery dissection + aortic atherosclerosis), BILATERAL carotid artery disease, labile HTN, HLD, T2DM, COPD, asthma, early stage CKD, GERD (on daily H2 blocker), PUD, BPH, OA.  Patient is followed by cardiology Thera, MD). He was last seen in the  cardiology clinic on 10/30/2023; notes reviewed. At the time of his clinic visit, patient doing well overall from a cardiovascular perspective. Patient denied any chest pain, shortness of breath, PND, orthopnea, palpitations, significant peripheral edema, weakness, fatigue, vertiginous symptoms, or presyncope/syncope. Patient with a past medical history significant for cardiovascular diagnoses. Documented physical exam was grossly benign, providing no evidence of acute exacerbation and/or decompensation of the patient's known cardiovascular conditions.  Most recent long-term cardiac event monitor study was performed on 10/10/2022 revealing a predominant underlying sinus rhythm at an average rate of 101 bpm; range 80-2 18.  There were 7 runs of PSVT with the fastest interval lasting 13.5 seconds at a maximum rate of 218 bpm.  Rare atrial and ventricular ectopy was noted.  There were no sustained arrhythmias or prolonged pauses.  Most recent myocardial perfusion imaging study was performed on 08/21/2023 revealing a  normal left ventricular systolic function with hyperdynamic LVEF of 69%.  There were no regional wall motion abnormalities.  No artifact or left ventricular cavity size enlargement appreciated on review of imaging.  1.0 mm of downsloping ST depression noted in leads II, III, aVF, and V6; ECG was inconclusive.  Coronary calcium  was present on the attenuation correction CT images.  SPECT images demonstrated no evidence of stress-induced myocardial ischemia or arrhythmia; no scintigraphic evidence of scar.  TID ratio = 0.82 (normal range </= 1.2). Study determined to be normal and low risk.  Most recent TTE performed on 01/07/2024 revealed a normal left ventricular systolic function with an EF of 55-60%. There was were no LVH.  There were no regional wall motion abnormalities.  Left ventricular diastolic Doppler parameters were normal.  Right ventricular size and function normal with a TAPSE measuring  2.7 cm  (  normal range >/= 1.6 cm).  Aortic valve sclerosis/calcification present.  There was no significant valvular regurgitation.  All transvalvular gradients were noted to be normal providing no evidence of hemodynamically significant valvular stenosis. Aorta normal in size with no evidence of ectasia or aneurysmal dilatation.  Blood pressure well controlled at 110/68 mmHg.  Patient has a history of labile hypertension that is controlled with midodrine .  Additionally, patient is on digoxin  for his PSVT.  He is on atorvastatin  for his HLD diagnosis and further ASCVD prevention.  At the time of his visit with cardiology, T2DM reasonably controlled with a hemoglobin A1c of 7.3% when checked on 06/12/2023.  Of note, since patient was last seen, hemoglobin A1c has been rechecked with minimal change to 7.4% on 12/19/2023. In the setting of known cardiovascular diagnoses and concurrent T2DM, patient is taking an SGLT2i (empagliflozin ) for added cardiovascular and renovascular protection.  Patient does not have an OSAH diagnosis. Patient is able to complete all of his  ADL/IADLs without cardiovascular limitation.  Per the DASI, patient is able to achieve at least 4 METS of physical activity without experiencing any significant degree of angina/anginal equivalent symptoms. No changes were made to his medication regimen during his visit with cardiology.  Patient scheduled to follow-up with outpatient cardiology in 6-12 months or sooner if needed.  Benjamin Conley is scheduled for an elective ANTERIOR CERVICAL DISCECTOMY AND FUSION (C4-C6) on 02/17/2024 with Dr. Penne LELON Sharps, MD. Given patient's past medical history significant for cardiovascular diagnoses, presurgical cardiac clearance was sought by the PAT team. Per cardiology, Benjamin Conley perioperative risk of a major cardiac event is 0.4% according to the Revised Cardiac Risk Index (RCRI).  Therefore, he is at low risk for perioperative complications.   His  functional capacity is fair at 4.4 METs according to the [place] Activity Status Index (DASI). According to ACC/AHA guidelines, no further cardiovascular testing needed.  The patient may proceed to surgery at ACCEPTABLE risk.  Again, this patient is on daily DAPT therapy.  He has been instructed on recommendations for holding his clopidogrel  for 7 days prior to her procedure with plans to restart as soon as postoperatively respectively minimized by his primary attending surgeon.  The patient is aware that his last dose of clopidogrel  should be on 02/09/2024. Given that patient's past medical history is significant for cardiovascular diagnoses, including but not limited to CAD, neurosurgery has cleared patient to continue his daily low dose ASA throughout his perioperative course. He will be asked to hold his normal dose on the day of his procedure only. Patient has been updated on these directives from his specialty care providers by the PAT team.  Patient reports previous perioperative complications with anesthesia in the past.  Patient advising that he experiences (+) difficulty breathing following general anesthesia.  In review his EMR, it is noted that patient underwent a general anesthetic course here at [facility] ([facility]) in 08/2019 without documented complications.   MOST RECENT VITAL SIGNS:    02/04/2024   10:35 AM 01/23/2024    9:02 AM 01/19/2024    8:32 AM  Vitals with BMI  Height 5' 5 5' 4 5' 4  Weight 159 lbs 156 lbs 168 lbs  BMI 26.46 26.76 28.82  Systolic 126 102 862  Diastolic 77 60 89  Pulse 101 87 90   PROVIDERS/SPECIALISTS: NOTE: Primary physician provider listed below. Patient may have been seen by APP or partner within same practice.  PROVIDER ROLE / SPECIALTY LAST OV  Claudene Penne ORN, MD Neurosurgery (Surgeon) 01/02/2024  Dineen Rollene MATSU, [name] Primary Care Provider 01/23/2024  Darliss Rogue, MD Cardiology 10/30/2023; preop  APP call 02/03/2024  Isaiah Scrivener, MD Pulmonary Medicine 09/03/2023  Jama Hacker, MD Vascular Surgery 01/19/2024   ALLERGIES: Allergies  Allergen Reactions   Wasp Venom Anaphylaxis   Wasp Venom Protein Anaphylaxis   Coffee Flavoring Agent (Non-Screening) Nausea And Vomiting    Patient states coffee gives him nausea and stomach cramps   Onion Nausea And Vomiting and Nausea Only   Prednisone  Other (See Comments)    DKA after IV solu medrol  following by oral prednisone     CURRENT HOME MEDICATIONS: No current facility-administered medications for this encounter.    acetaminophen  (TYLENOL ) 500 MG tablet   albuterol  (VENTOLIN  HFA) 108 (90 Base) MCG/ACT inhaler   aspirin  81 MG EC tablet   atorvastatin  (LIPITOR) 40 MG tablet   budesonide -glycopyrrolate -formoterol  (BREZTRI ) 160-9-4.8 MCG/ACT AERO inhaler   clopidogrel  (PLAVIX ) 75 MG tablet   digoxin  (LANOXIN ) 0.125 MG tablet   empagliflozin  (JARDIANCE ) 25 MG TABS tablet   midodrine  (PROAMATINE ) 10 MG tablet   Semaglutide , 2 MG/DOSE, (OZEMPIC , 2 MG/DOSE,) 8 MG/3ML SOPN   triamcinolone  cream (KENALOG ) 0.1 %   ACCU-CHEK GUIDE TEST test strip   blood glucose meter kit and supplies   Blood Glucose Monitoring Suppl DEVI   Continuous Glucose Sensor (DEXCOM G7 SENSOR) MISC   Ensifentrine  (OHTUVAYRE ) 3 MG/2.5ML SUSP   famotidine  (PEPCID ) 20 MG tablet   ipratropium-albuterol  (DUONEB) 0.5-2.5 (3) MG/3ML SOLN   silodosin  (RAPAFLO ) 8 MG CAPS capsule   HISTORY: Past Medical History:  Diagnosis Date   Acute posthemorrhagic anemia    Allergy 2015   Aortic atherosclerosis    Arthritis    Asthma 2008   Bilateral carotid artery disease    BPH (benign prostatic hyperplasia)    Complication of anesthesia    c/o difficulty breathing after anesthesia   COPD (chronic obstructive pulmonary disease) (HCC)    Coronary artery disease    GERD (gastroesophageal reflux disease)    Hyperlipemia    Labile hypertension    Long term current use of  clopidogrel     Long-term use of aspirin  therapy    Melena 08/21/2019   PAD (peripheral artery disease)    Paroxysmal supraventricular tachycardia 08/19/2019   Postural dizziness with presyncope 08/21/2019   Renal disorder    early stage CKD   Splenic infarction s/p stent placement 07/2019   T2DM (type 2 diabetes mellitus) (HCC)    Tobacco abuse    Ulcer    Upper GI bleed 08/21/2019   Past Surgical History:  Procedure Laterality Date   BACK SURGERY     lumbar   COLONOSCOPY WITH PROPOFOL  N/A 08/24/2019   Procedure: COLONOSCOPY WITH PROPOFOL ;  Surgeon: Jinny Carmine, MD;  Location: [facility] ENDOSCOPY;  Service: Endoscopy;  Laterality: N/A;   ESOPHAGOGASTRODUODENOSCOPY (EGD) WITH PROPOFOL  N/A 08/24/2019   Procedure: ESOPHAGOGASTRODUODENOSCOPY (EGD) WITH PROPOFOL ;  Surgeon: Jinny Carmine, MD;  Location: [facility] ENDOSCOPY;  Service: Endoscopy;  Laterality: N/A;   SPINE SURGERY  2008   STENT PLACEMENT VASCULAR ([facility] HX)  07/2019   stenosis of distal splenic artery and stent placed   VISCERAL ANGIOGRAPHY N/A 08/06/2019   Procedure: VISCERAL ANGIOGRAPHY;  Surgeon: Jama Hacker MATSU, MD;  Location: [facility] INVASIVE CV LAB;  Service: Cardiovascular;  Laterality: N/A;   Family History  Problem Relation Age of Onset   Diabetes Mother    Diabetes Mellitus II Mother  Obesity Mother    Tuberculosis Father    Heart disease Father    Diabetes Father    Diabetes Sister    Diabetes Sister    Diabetes Sister    Diabetes Sister    Diabetes Brother    Diabetes Mellitus II Brother    Hyperlipidemia Brother    Obesity Brother    Cancer Maternal Aunt    Colon cancer Maternal Uncle    Obesity Sister    Obesity Sister    Obesity Sister    Obesity Sister    Prostate cancer Neg Hx    Social History   Tobacco Use   Smoking status: Former    Current packs/day: 0.00    Average packs/day: 2.0 packs/day for 48.0 years (96.0 ttl pk-yrs)    Types: Cigarettes    Start date: 02/12/1972    Quit date:  02/12/2020    Years since quitting: 4.0   Smokeless tobacco: Never  Substance Use Topics   Alcohol use: No    Comment: Never been a problem   LABS:  Lab Results  Component Value Date   WBC 6.1 02/04/2024   HGB 11.5 (L) 02/04/2024   HCT 35.5 (L) 02/04/2024   MCV 86.0 02/04/2024   PLT 432 (H) 02/04/2024   Lab Results  Component Value Date   NA 139 02/04/2024   CL 105 02/04/2024   K 3.9 02/04/2024   CO2 24 02/04/2024   BUN 15 02/04/2024   CREATININE 0.66 02/04/2024   GFRNONAA >60 02/04/2024   CALCIUM  9.0 02/04/2024   ALBUMIN 4.0 06/14/2023   GLUCOSE 205 (H) 02/04/2024    ECG: Date: 10/30/2023  Time ECG obtained: 0847 AM Rate: 87 bpm Rhythm: normal sinus Axis (leads I and aVF): normal Intervals: PR 146 ms. QRS 84 ms. QTc 401 ms. ST segment and T wave changes: Inferior ST and T wave abnormalities Evidence of a possible, age undetermined, prior infarct:  No Comparison: Similar to previous tracing obtained on 09/19/2023   IMAGING / PROCEDURES: VAS US  AORTA/IVC/ILIACS performed on 01/19/2024 Dissection seen in left common iliac artery.  No evidence of an abdominal aortic aneurysm was visualized. The largest aortic measurement is 1.9 cm.   TRANSTHORACIC ECHOCARDIOGRAM performed on 01/07/2024 Left ventricular ejection fraction, by estimation, is 55 to 60%. The left ventricle has normal function. The left ventricle has no regional wall motion abnormalities. Left ventricular diastolic parameters were normal.  Right ventricular systolic function is normal. The right ventricular size is normal.  The mitral valve is normal in structure. No evidence of mitral valve regurgitation.  The aortic valve is tricuspid. Aortic valve regurgitation is not visualized. Aortic valve sclerosis/calcification is present, without any evidence of aortic stenosis.  The inferior vena cava is normal in size with greater than 50% respiratory variability, suggesting right atrial pressure of 3 mmHg.    MYOCARDIAL PERFUSION IMAGING STUDY (LEXISCAN ) performed on 08/21/2023 1.0 mm of down sloping ST depression (II, III, aVF and V6) was noted. ECG was interpretable and non-conclusive. LV perfusion is normal. There is no evidence of ischemia. There is no evidence of infarction. Left ventricular function is normal. Nuclear stress EF: 69%. End diastolic cavity size is normal. End systolic cavity size is normal. Coronary calcium  was present on the attenuation correction CT images. Mild coronary calcifications were present. The study is normal. The study is low risk.   VAS US  ABI WITH/WO TBI performed on 07/18/2023 Resting bilateral ankle-brachial indexes indicate noncompressible lower extremity arteries.  Bilateral Doppler waveforms,  PPG tracings and TBIs suggest normal perfusion to the bilateral lower  extremities.    LONG TERM CARDIAC EVENT MONITOR STUDY performed on  10/10/2022 Patch Wear Time:  13 days and 15 hours (2024-07-08T18:00:31-0400 to 2024-07-22T09:56:03-398) Patient had a min HR of 80 bpm, max HR of 218 bpm, and avg HR of 101 bpm.  Predominant underlying rhythm was Sinus Rhythm.  7 Supraventricular Tachycardia runs occurred, the run with the fastest interval lasting 13.5 secs with a max rate of 218 bpm (avg 163 bpm); the run with the fastest interval was also the longest.  Isolated SVEs were rare (<1.0%), SVE Couplets were rare (<1.0%), and SVE Triplets were rare (<1.0%).  Isolated VEs were rare (<1.0%), and no VE Couplets or VE Triplets were present.   IMPRESSION AND PLAN: Alto Gandolfo has been referred for pre-anesthesia review and clearance prior to him undergoing the planned anesthetic and procedural courses. Available labs, pertinent testing, and imaging results were personally reviewed by me in preparation for upcoming operative/procedural course. [facility] medical record has been updated following extensive record review and patient interview with PAT staff.   This  patient has been appropriately cleared by cardiology with an overall ACCEPTABLE risk of patient experiencing significant perioperative cardiovascular complications. here at [facility]. Based on clinical review performed today (02/12/24), barring any significant acute changes in the patient's overall condition, it is anticipated that he will be able to proceed with the planned surgical intervention. Any acute changes in clinical condition may necessitate his procedure being postponed and/or cancelled. Patient will meet with anesthesia team (MD and/or CRNA) on the day of his procedure for preoperative evaluation/assessment. Questions regarding anesthetic course will be fielded at that time.   Pre-surgical instructions were reviewed with the patient during his PAT appointment, and questions were fielded to satisfaction by PAT clinical staff. He has been instructed on which medications that he will need to hold prior to surgery, as well as the ones that have been deemed safe/appropriate to take on the day of his procedure. As part of the general education provided by PAT, patient made aware both verbally and in writing, that he would need to abstain from the use of any illegal substances during his perioperative course. He was advised that failure to follow the provided instructions could necessitate case cancellation or result in serious perioperative complications up to and including death. Patient encouraged to contact PAT and/or his surgeon's office to discuss any questions or concerns that may arise prior to surgery; verbalized understanding.   Dorise Pereyra, MSN, APRN, [name]-C, CEN [facility]  Perioperative Services Nurse Practitioner Phone: [phone] Fax: [phone] 02/12/24 1:28 PM  NOTE: This note has been prepared using Scientist, clinical (histocompatibility and immunogenetics). Despite my best ability to proofread, there is always the potential that unintentional  transcriptional errors may still occur from this process.

## 2024-02-17 ENCOUNTER — Encounter: Payer: Self-pay | Admitting: Urgent Care

## 2024-02-17 ENCOUNTER — Observation Stay
Admission: RE | Admit: 2024-02-17 | Discharge: 2024-02-18 | Disposition: A | Attending: Neurosurgery | Admitting: Neurosurgery

## 2024-02-17 ENCOUNTER — Ambulatory Visit: Payer: Self-pay | Admitting: Urgent Care

## 2024-02-17 ENCOUNTER — Ambulatory Visit

## 2024-02-17 ENCOUNTER — Encounter: Payer: Self-pay | Admitting: Neurosurgery

## 2024-02-17 ENCOUNTER — Other Ambulatory Visit: Payer: Self-pay

## 2024-02-17 ENCOUNTER — Encounter: Admission: RE | Disposition: A | Payer: Self-pay | Source: Home / Self Care | Attending: Neurosurgery

## 2024-02-17 DIAGNOSIS — M4802 Spinal stenosis, cervical region: Secondary | ICD-10-CM | POA: Insufficient documentation

## 2024-02-17 DIAGNOSIS — E119 Type 2 diabetes mellitus without complications: Secondary | ICD-10-CM

## 2024-02-17 DIAGNOSIS — Z01818 Encounter for other preprocedural examination: Secondary | ICD-10-CM

## 2024-02-17 DIAGNOSIS — M47812 Spondylosis without myelopathy or radiculopathy, cervical region: Secondary | ICD-10-CM | POA: Insufficient documentation

## 2024-02-17 DIAGNOSIS — M4712 Other spondylosis with myelopathy, cervical region: Secondary | ICD-10-CM | POA: Diagnosis present

## 2024-02-17 DIAGNOSIS — G959 Disease of spinal cord, unspecified: Principal | ICD-10-CM | POA: Diagnosis present

## 2024-02-17 HISTORY — PX: ANTERIOR CERVICAL DECOMP/DISCECTOMY FUSION: SHX1161

## 2024-02-17 HISTORY — DX: Long term (current) use of aspirin: Z79.82

## 2024-02-17 HISTORY — DX: Other specified symptoms and signs involving the circulatory and respiratory systems: R09.89

## 2024-02-17 HISTORY — DX: Disorder of arteries and arterioles, unspecified: I77.9

## 2024-02-17 HISTORY — DX: Peripheral vascular disease, unspecified: I73.9

## 2024-02-17 HISTORY — DX: Atherosclerosis of aorta: I70.0

## 2024-02-17 HISTORY — DX: Type 2 diabetes mellitus without complications: E11.9

## 2024-02-17 HISTORY — DX: Long term (current) use of antithrombotics/antiplatelets: Z79.02

## 2024-02-17 HISTORY — DX: Benign prostatic hyperplasia without lower urinary tract symptoms: N40.0

## 2024-02-17 HISTORY — DX: Dissection of iliac artery: I77.72

## 2024-02-17 LAB — GLUCOSE, CAPILLARY
Glucose-Capillary: 152 mg/dL — ABNORMAL HIGH (ref 70–99)
Glucose-Capillary: 140 mg/dL — ABNORMAL HIGH (ref 70–99)
Glucose-Capillary: 192 mg/dL — ABNORMAL HIGH (ref 70–99)

## 2024-02-17 SURGERY — ANTERIOR CERVICAL DECOMPRESSION/DISCECTOMY FUSION 2 LEVEL/HARDWARE REMOVAL
Anesthesia: General

## 2024-02-17 MED ORDER — OXYCODONE HCL 5 MG PO TABS: 10.0000 mg | ORAL_TABLET | ORAL | Status: DC | PRN

## 2024-02-17 MED ORDER — SODIUM CHLORIDE 0.9 % IV SOLN: 250.0000 mL | INTRAVENOUS | Status: DC

## 2024-02-17 MED ORDER — FENTANYL CITRATE (PF) 100 MCG/2ML IJ SOLN
INTRAMUSCULAR | Status: DC | PRN
  Administered 2024-02-17 (×4): 50 ug via INTRAVENOUS

## 2024-02-17 MED ORDER — MIDAZOLAM HCL (PF) 2 MG/2ML IJ SOLN
INTRAMUSCULAR | Status: DC | PRN
  Administered 2024-02-17 (×2): 1 mg via INTRAVENOUS

## 2024-02-17 MED ORDER — ACETAMINOPHEN 10 MG/ML IV SOLN
INTRAVENOUS | Status: DC | PRN
  Administered 2024-02-17: 1000 mg via INTRAVENOUS

## 2024-02-17 MED ORDER — OXYCODONE HCL 5 MG PO TABS: 5.0000 mg | ORAL_TABLET | ORAL | Status: DC | PRN

## 2024-02-17 MED ORDER — METHOCARBAMOL 500 MG PO TABS: 500.0000 mg | ORAL_TABLET | Freq: Four times a day (QID) | ORAL | Status: DC | PRN

## 2024-02-17 MED ORDER — OXYCODONE HCL 5 MG PO TABS
ORAL_TABLET | ORAL | Status: AC
  Filled 2024-02-17: qty 1

## 2024-02-17 MED ORDER — INSULIN ASPART 100 UNIT/ML IJ SOLN: 0.0000 [IU] | Freq: Every day | INTRAMUSCULAR | Status: DC

## 2024-02-17 MED ORDER — POLYETHYLENE GLYCOL 3350 17 G PO PACK
17.0000 g | PACK | Freq: Every day | ORAL | Status: DC | PRN
  Administered 2024-02-18: 17 g via ORAL
  Filled 2024-02-17: qty 1

## 2024-02-17 MED ORDER — ACETAMINOPHEN 10 MG/ML IV SOLN
INTRAVENOUS | Status: AC
  Filled 2024-02-17: qty 100

## 2024-02-17 MED ORDER — CHLORHEXIDINE GLUCONATE 0.12 % MT SOLN
OROMUCOSAL | Status: AC
  Filled 2024-02-17: qty 15

## 2024-02-17 MED ORDER — 0.9 % SODIUM CHLORIDE (POUR BTL) OPTIME
TOPICAL | Status: DC | PRN
  Administered 2024-02-17: 200 mL

## 2024-02-17 MED ORDER — FENTANYL CITRATE (PF) 100 MCG/2ML IJ SOLN
25.0000 ug | INTRAMUSCULAR | Status: DC | PRN
  Administered 2024-02-17 (×2): 50 ug via INTRAVENOUS

## 2024-02-17 MED ORDER — SUCCINYLCHOLINE CHLORIDE 200 MG/10ML IV SOSY
PREFILLED_SYRINGE | INTRAVENOUS | Status: DC | PRN
  Administered 2024-02-17: 100 mg via INTRAVENOUS

## 2024-02-17 MED ORDER — HYDROMORPHONE HCL 1 MG/ML IJ SOLN: 0.5000 mg | INTRAMUSCULAR | Status: AC | PRN

## 2024-02-17 MED ORDER — ACETAMINOPHEN 500 MG PO TABS
1000.0000 mg | ORAL_TABLET | Freq: Four times a day (QID) | ORAL | Status: DC
  Administered 2024-02-17 – 2024-02-18 (×3): 1000 mg via ORAL
  Filled 2024-02-17 (×5): qty 2

## 2024-02-17 MED ORDER — SODIUM CHLORIDE 0.9 % IV SOLN: INTRAVENOUS | Status: DC

## 2024-02-17 MED ORDER — PROPOFOL 500 MG/50ML IV EMUL
INTRAVENOUS | Status: DC | PRN
  Administered 2024-02-17: 145 ug/kg/min via INTRAVENOUS

## 2024-02-17 MED ORDER — ONDANSETRON HCL 4 MG PO TABS: 4.0000 mg | ORAL_TABLET | Freq: Four times a day (QID) | ORAL | Status: DC | PRN

## 2024-02-17 MED ORDER — INSULIN ASPART 100 UNIT/ML IJ SOLN
0.0000 [IU] | Freq: Three times a day (TID) | INTRAMUSCULAR | Status: DC
  Administered 2024-02-18 (×2): 5 [IU] via SUBCUTANEOUS
  Filled 2024-02-17 (×2): qty 5

## 2024-02-17 MED ORDER — OXYCODONE HCL 5 MG PO TABS
5.0000 mg | ORAL_TABLET | Freq: Once | ORAL | Status: AC | PRN
  Administered 2024-02-17: 5 mg via ORAL

## 2024-02-17 MED ORDER — PROPOFOL 10 MG/ML IV BOLUS
INTRAVENOUS | Status: DC | PRN
  Administered 2024-02-17: 150 mg via INTRAVENOUS

## 2024-02-17 MED ORDER — ENOXAPARIN SODIUM 40 MG/0.4ML IJ SOSY
40.0000 mg | PREFILLED_SYRINGE | INTRAMUSCULAR | Status: DC
  Administered 2024-02-18: 40 mg via SUBCUTANEOUS
  Filled 2024-02-17: qty 0.4

## 2024-02-17 MED ORDER — REMIFENTANIL HCL 1 MG IV SOLR
INTRAVENOUS | Status: DC | PRN
  Administered 2024-02-17: .15 ug/kg/min via INTRAVENOUS

## 2024-02-17 MED ORDER — OXYCODONE HCL 5 MG/5ML PO SOLN: 5.0000 mg | Freq: Once | ORAL | Status: AC | PRN

## 2024-02-17 MED ORDER — ACETAMINOPHEN 325 MG PO TABS: 650.0000 mg | ORAL_TABLET | ORAL | Status: DC | PRN

## 2024-02-17 MED ORDER — ACETAMINOPHEN 650 MG RE SUPP: 650.0000 mg | RECTAL | Status: DC | PRN

## 2024-02-17 MED ORDER — MIDODRINE HCL 5 MG PO TABS
10.0000 mg | ORAL_TABLET | Freq: Every morning | ORAL | Status: DC
  Filled 2024-02-17: qty 2

## 2024-02-17 MED ORDER — DOCUSATE SODIUM 100 MG PO CAPS
100.0000 mg | ORAL_CAPSULE | Freq: Two times a day (BID) | ORAL | Status: DC
  Administered 2024-02-17 – 2024-02-18 (×2): 100 mg via ORAL
  Filled 2024-02-17 (×2): qty 1

## 2024-02-17 MED ORDER — BUDESON-GLYCOPYRROL-FORMOTEROL 160-9-4.8 MCG/ACT IN AERO: 2.0000 | INHALATION_SPRAY | Freq: Two times a day (BID) | RESPIRATORY_TRACT | Status: DC | PRN

## 2024-02-17 MED ORDER — REMIFENTANIL HCL 1 MG IV SOLR
INTRAVENOUS | Status: AC
  Filled 2024-02-17: qty 1000

## 2024-02-17 MED ORDER — CEFAZOLIN SODIUM-DEXTROSE 2-4 GM/100ML-% IV SOLN
INTRAVENOUS | Status: AC
  Filled 2024-02-17: qty 100

## 2024-02-17 MED ORDER — MAGNESIUM CITRATE PO SOLN: 1.0000 | Freq: Once | ORAL | Status: DC | PRN

## 2024-02-17 MED ORDER — ONDANSETRON HCL 4 MG/2ML IJ SOLN: 4.0000 mg | Freq: Four times a day (QID) | INTRAMUSCULAR | Status: DC | PRN

## 2024-02-17 MED ORDER — PHENOL 1.4 % MT LIQD: 1.0000 | OROMUCOSAL | Status: DC | PRN

## 2024-02-17 MED ORDER — ALBUTEROL SULFATE HFA 108 (90 BASE) MCG/ACT IN AERS: 2.0000 | INHALATION_SPRAY | Freq: Four times a day (QID) | RESPIRATORY_TRACT | Status: DC | PRN

## 2024-02-17 MED ORDER — FENTANYL CITRATE (PF) 100 MCG/2ML IJ SOLN
INTRAMUSCULAR | Status: AC
  Filled 2024-02-17: qty 2

## 2024-02-17 MED ORDER — DIGOXIN 125 MCG PO TABS
0.1250 mg | ORAL_TABLET | Freq: Every morning | ORAL | Status: DC
  Administered 2024-02-18: 0.125 mg via ORAL
  Filled 2024-02-17: qty 1

## 2024-02-17 MED ORDER — PROPOFOL 1000 MG/100ML IV EMUL
INTRAVENOUS | Status: AC
  Filled 2024-02-17: qty 100

## 2024-02-17 MED ORDER — TRIAMCINOLONE ACETONIDE 0.1 % EX CREA: 1.0000 | TOPICAL_CREAM | Freq: Two times a day (BID) | CUTANEOUS | Status: DC | PRN

## 2024-02-17 MED ORDER — MIDAZOLAM HCL 2 MG/2ML IJ SOLN
INTRAMUSCULAR | Status: AC
  Filled 2024-02-17: qty 2

## 2024-02-17 MED ORDER — LIDOCAINE HCL (CARDIAC) PF 100 MG/5ML IV SOSY
PREFILLED_SYRINGE | INTRAVENOUS | Status: DC | PRN
  Administered 2024-02-17: 100 mg via INTRAVENOUS

## 2024-02-17 MED ORDER — EMPAGLIFLOZIN 25 MG PO TABS
25.0000 mg | ORAL_TABLET | Freq: Every day | ORAL | Status: DC
  Administered 2024-02-18: 25 mg via ORAL
  Filled 2024-02-17: qty 1

## 2024-02-17 MED ORDER — METHOCARBAMOL 1000 MG/10ML IJ SOLN: 500.0000 mg | Freq: Four times a day (QID) | INTRAMUSCULAR | Status: DC | PRN

## 2024-02-17 MED ORDER — BUPIVACAINE-EPINEPHRINE (PF) 0.5% -1:200000 IJ SOLN
INTRAMUSCULAR | Status: DC | PRN
  Administered 2024-02-17: 9 mL

## 2024-02-17 MED ORDER — PHENYLEPHRINE 80 MCG/ML (10ML) SYRINGE FOR IV PUSH (FOR BLOOD PRESSURE SUPPORT)
PREFILLED_SYRINGE | INTRAVENOUS | Status: DC | PRN
  Administered 2024-02-17 (×2): 80 ug via INTRAVENOUS

## 2024-02-17 MED ORDER — DEXMEDETOMIDINE HCL IN NACL 200 MCG/50ML IV SOLN
INTRAVENOUS | Status: DC | PRN
  Administered 2024-02-17: 8 ug via INTRAVENOUS

## 2024-02-17 MED ORDER — ORAL CARE MOUTH RINSE: 15.0000 mL | Freq: Once | OROMUCOSAL | Status: AC

## 2024-02-17 MED ORDER — MENTHOL 3 MG MT LOZG: 1.0000 | LOZENGE | OROMUCOSAL | Status: DC | PRN

## 2024-02-17 MED ORDER — KETOROLAC TROMETHAMINE 15 MG/ML IJ SOLN
15.0000 mg | Freq: Four times a day (QID) | INTRAMUSCULAR | Status: AC
  Administered 2024-02-17 – 2024-02-18 (×4): 15 mg via INTRAVENOUS
  Filled 2024-02-17 (×4): qty 1

## 2024-02-17 MED ORDER — ALBUTEROL SULFATE (2.5 MG/3ML) 0.083% IN NEBU: 2.5000 mg | INHALATION_SOLUTION | Freq: Four times a day (QID) | RESPIRATORY_TRACT | Status: DC | PRN

## 2024-02-17 MED ORDER — PHENYLEPHRINE HCL-NACL 20-0.9 MG/250ML-% IV SOLN
INTRAVENOUS | Status: DC | PRN
  Administered 2024-02-17: 25 ug/min via INTRAVENOUS

## 2024-02-17 MED ORDER — GLYCOPYRROLATE 0.2 MG/ML IJ SOLN
INTRAMUSCULAR | Status: DC | PRN
  Administered 2024-02-17: .2 mg via INTRAVENOUS

## 2024-02-17 MED ORDER — ATORVASTATIN CALCIUM 20 MG PO TABS
20.0000 mg | ORAL_TABLET | Freq: Every day | ORAL | Status: DC
  Administered 2024-02-18: 20 mg via ORAL
  Filled 2024-02-17: qty 1

## 2024-02-17 MED ORDER — CHLORHEXIDINE GLUCONATE 0.12 % MT SOLN
15.0000 mL | Freq: Once | OROMUCOSAL | Status: AC
  Administered 2024-02-17: 15 mL via OROMUCOSAL

## 2024-02-17 MED ORDER — SORBITOL 70 % SOLN: 30.0000 mL | Freq: Every day | Status: DC | PRN

## 2024-02-17 MED ORDER — DEXAMETHASONE SOD PHOSPHATE PF 10 MG/ML IJ SOLN
INTRAMUSCULAR | Status: DC | PRN
  Administered 2024-02-17: 5 mg via INTRAVENOUS

## 2024-02-17 MED ORDER — SODIUM CHLORIDE 0.9% FLUSH
3.0000 mL | Freq: Two times a day (BID) | INTRAVENOUS | Status: DC
  Administered 2024-02-17 – 2024-02-18 (×2): 3 mL via INTRAVENOUS

## 2024-02-17 MED ORDER — SENNA 8.6 MG PO TABS
1.0000 | ORAL_TABLET | Freq: Two times a day (BID) | ORAL | Status: DC
  Administered 2024-02-17 – 2024-02-18 (×2): 8.6 mg via ORAL
  Filled 2024-02-17 (×2): qty 1

## 2024-02-17 MED ORDER — SURGIFLO WITH THROMBIN (HEMOSTATIC MATRIX KIT) OPTIME
TOPICAL | Status: DC | PRN
  Administered 2024-02-17: 1 via TOPICAL

## 2024-02-17 MED ORDER — SODIUM CHLORIDE 0.9% FLUSH: 3.0000 mL | INTRAVENOUS | Status: DC | PRN

## 2024-02-17 MED ORDER — ONDANSETRON HCL 4 MG/2ML IJ SOLN
INTRAMUSCULAR | Status: DC | PRN
  Administered 2024-02-17 (×2): 4 mg via INTRAVENOUS

## 2024-02-17 MED ORDER — CEFAZOLIN IN SODIUM CHLORIDE 2-0.9 GM/100ML-% IV SOLN
2.0000 g | Freq: Once | INTRAVENOUS | Status: AC
  Administered 2024-02-17: 2 g via INTRAVENOUS

## 2024-02-17 SURGICAL SUPPLY — 38 items
BASIN KIT SINGLE STR (MISCELLANEOUS) ×1 IMPLANT
BIT DRILL ACP 13 (DRILL) IMPLANT
BRUSH SCRUB EZ 4% CHG (MISCELLANEOUS) ×1 IMPLANT
BUR NEURO DRILL SOFT 3.0X3.8M (BURR) ×1 IMPLANT
DERMABOND ADVANCED .7 DNX12 (GAUZE/BANDAGES/DRESSINGS) ×1 IMPLANT
DRAIN CHANNEL JP 10F RND 20C F (MISCELLANEOUS) IMPLANT
DRAPE C-ARM XRAY 36X54 (DRAPES) ×2 IMPLANT
DRAPE LAPAROTOMY 77X122 PED (DRAPES) ×1 IMPLANT
DRAPE SPINE LEICA/WILD 54X150 (DRAPES) ×1 IMPLANT
DRSG TEGADERM 2-3/8X2-3/4 SM (GAUZE/BANDAGES/DRESSINGS) IMPLANT
ELECTRODE REM PT RTRN 9FT ADLT (ELECTROSURGICAL) ×1 IMPLANT
EVACUATOR SILICONE 100CC (DRAIN) IMPLANT
FEE INTRAOP CADWELL SUPPLY NCS (MISCELLANEOUS) IMPLANT
FEE INTRAOP MONITOR IMPULS NCS (MISCELLANEOUS) IMPLANT
GLOVE BIOGEL PI IND STRL 7.0 (GLOVE) ×1 IMPLANT
GLOVE BIOGEL PI IND STRL 8 (GLOVE) ×1 IMPLANT
GLOVE SURG SYN 7.0 PF PI (GLOVE) ×1 IMPLANT
GLOVE SURG SYN 7.5 PF PI (GLOVE) ×1 IMPLANT
GOWN SRG XL LVL 3 NONREINFORCE (GOWNS) ×1 IMPLANT
GOWN STRL REUS W/ TWL XL LVL3 (GOWN DISPOSABLE) ×1 IMPLANT
KIT TURNOVER KIT A (KITS) ×1 IMPLANT
MANIFOLD NEPTUNE II (INSTRUMENTS) ×1 IMPLANT
NS IRRIG 500ML POUR BTL (IV SOLUTION) ×1 IMPLANT
PACK LAMINECTOMY ARMC (PACKS) ×1 IMPLANT
PAD ARMBOARD POSITIONER FOAM (MISCELLANEOUS) ×2 IMPLANT
PIN CASPAR 14 (PIN) ×1 IMPLANT
PLATE ACP 1.6X38 2LVL (Plate) IMPLANT
SCREW ACP VA ST 3.5X15 (Screw) IMPLANT
SPACER CERVICAL FRGE 12X14X6-7 (Spacer) IMPLANT
SPACER CERVICAL FRGE 12X14X7-7 (Spacer) IMPLANT
SPONGE KITTNER 5P (MISCELLANEOUS) ×1 IMPLANT
SURGIFLO W/THROMBIN 8M KIT (HEMOSTASIS) ×1 IMPLANT
SUT MNCRL AB 4-0 PS2 18 (SUTURE) IMPLANT
SUT STRATA 3-0 30 PS-1 (SUTURE) IMPLANT
SUT VIC AB 3-0 SH 8-18 (SUTURE) ×1 IMPLANT
SYR 20ML LL LF (SYRINGE) ×1 IMPLANT
TAPE CLOTH 3X10 WHT NS LF (GAUZE/BANDAGES/DRESSINGS) ×2 IMPLANT
TRAP FLUID SMOKE EVACUATOR (MISCELLANEOUS) ×1 IMPLANT

## 2024-02-17 NOTE — Transfer of Care (Signed)
 Immediate Anesthesia Transfer of Care Note  Patient: Benjamin Conley  Procedure(s) Performed: Anterior cervical discectomy and fusion, C4-C6  Patient Location: PACU  Anesthesia Type:General  Level of Consciousness: awake, drowsy, and patient cooperative  Airway & Oxygen Therapy: Patient Spontanous Breathing and Patient connected to face mask oxygen  Post-op Assessment: Report given to RN and Post -op Vital signs reviewed and stable  Post vital signs: Reviewed and stable  Last Vitals:  Vitals Value Taken Time  BP 125/87 02/17/24 16:15  Temp 36.5 C 02/17/24 16:12  Pulse 100 02/17/24 16:17  Resp 12 02/17/24 16:17  SpO2 100 % 02/17/24 16:17  Vitals shown include unfiled device data.  Last Pain:  Vitals:   02/17/24 1122  TempSrc: Temporal  PainSc: 5          Complications: No notable events documented.

## 2024-02-17 NOTE — Anesthesia Procedure Notes (Signed)
 Procedure Name: Intubation Date/Time: 02/17/2024 1:41 PM  Performed by: Ledora Duncan, CRNAPre-anesthesia Checklist: Patient identified, Emergency Drugs available, Suction available and Patient being monitored Patient Re-evaluated:Patient Re-evaluated prior to induction Oxygen Delivery Method: Circle system utilized Preoxygenation: Pre-oxygenation with 100% oxygen Induction Type: IV induction Ventilation: Mask ventilation without difficulty Laryngoscope Size: McGrath and 3 Grade View: Grade I Tube type: Oral Tube size: 7.0 mm Number of attempts: 1 Airway Equipment and Method: Stylet Placement Confirmation: ETT inserted through vocal cords under direct vision, positive ETCO2 and breath sounds checked- equal and bilateral Secured at: 21 cm Tube secured with: Tape Dental Injury: Teeth and Oropharynx as per pre-operative assessment

## 2024-02-17 NOTE — Discharge Instructions (Signed)

## 2024-02-17 NOTE — Op Note (Signed)
 Indications: 58 year old man with a history of progressive cervical myeloradiculopathy found to have multilevel cervical stenosis with T2 signal change at C4-5 and C5-6  Findings: Severe cervical stenosis secondary to disc osteophyte complexes causing severe compression of the cervical spine well decompressed at the end of the procedure    Preoperative Diagnosis: Cervical spondylosis with myelopathy  Postoperative Diagnosis: Cervical spondylosis with myelopathy   EBL: 20 mL IVF: See Anesthesia Report Drains: none Disposition: Extubated and Stable to PACU Complications: none  No foley catheter was placed.   Preoperative Note:   Risks of surgery discussed include: infection, bleeding, stroke, coma, death, paralysis, CSF leak, nerve/spinal cord injury, numbness, tingling, weakness, complex regional pain syndrome, recurrent stenosis and/or disc herniation, vascular injury, development of instability, neck/back pain, need for further surgery, persistent symptoms, development of deformity, and the risks of anesthesia. The patient understood these risks and agreed to proceed.  Procedure:  1) Anterior cervical diskectomy and fusion at C4-6 2) Anterior cervical instrumentation at C4-6 3) Structural allograft consisting of corticocancellous allograft   Procedure: After obtaining informed consent, the patient taken to the operating room, placed in supine position, general anesthesia induced.  The patient had a small shoulder roll placed behind their shoulders.  The patient received preop antibiotics and IV Decadron .  The patient had a neck incision outlined, was prepped and draped in usual sterile fashion. The incision was injected with local anesthetic.   An incision was opened, dissection taken down medial to the carotid artery and jugular vein, lateral to the trachea and esophagus.  The prevertebral fascia identified and a localizing x-ray demonstrated the correct level.  The longus colli were  dissected laterally, and self-retaining retractors placed to open the operative field. The microscope was then brought into the field.  With this complete, distractor pins were placed in the vertebral bodies of C 4 and C5. The distractor was placed, and the annulus at C4/5 was opened using a bovie.  Curettes and pituitary rongeurs used to remove the majority of disk, then the drill was used to remove the posterior osteophyte and begin the foraminotomies. The nerve hook was used to elevate the posterior longitudinal ligament, which was then removed with Kerrison rongeurs. The microblunt nerve hook could be passed out the foramen bilaterally.   Meticulous hemostasis obtained.  Structural allograft was tapped behind the anterior lip of the vertebral body at C4/5(7 mm).    With this complete, distractor pins were placed in the vertebral bodies of C5 and C6. The distractor was placed, and the annulus at C5/6 was opened using a bovie.  Curettes and pituitary rongeurs used to remove the majority of disk, then the drill was used to remove the posterior osteophyte and begin the foraminotomies. The nerve hook was used to elevate the posterior longitudinal ligament, which was then removed with Kerrison rongeurs. The microblunt nerve hook could be passed out the foramen bilaterally.   Meticulous hemostasis obtained.  Structural allograft was tapped behind the anterior lip of the vertebral body at C5/6 (3 mm).    The caspar distractor was removed, and bone wax used for hemostasis. A separate, 38 mm plate was chosen.  Two screws placed in each vertebral body, respectively making sure the screws were behind the locking mechanism.  Final AP and lateral radiographs were taken.   With everything in good position, the wound was irrigated copiously and meticulous hemostasis obtained.  Wound was closed in 2 layers using interrupted inverted 3-0 Vicryl sutures.  The wound was  dressed with dermabond, the head of bed at 30 degrees,  taken to recovery room in stable condition.  No new postop neurological deficits were identified.  Sponge and pattie counts were correct at the end of the procedure.    I performed the procedure with the assistance of [employer], they aided in the exposure, retraction of critical structures, visualization of critical structures, stabilization during instrumentation, and closure.  Implants: Implant Name Type Inv. Item Serial No. Manufacturer Lot No. LRB No. Used Action  SPACER CERVICAL FRGE [id number] - [id number] Spacer SPACER CERVICAL FRGE [id number] [employer] [id number] DONOR N/A 1 Implanted  SPACER CERVICAL FRGE [id number] - [id number] Spacer SPACER CERVICAL FRGE [id number] [facility] MEDICAL [id number] DONOR N/A 1 Implanted  PLATE ACP 8.3K61 2LVL - [id number] Plate PLATE ACP 8.3K61 2LVL  [employer]  N/A 1 Implanted  SCREW ACP VA ST 3.5X15 - [id number] Screw SCREW ACP VA ST 3.5X15  [employer]  N/A 6 Implanted      Penne MICAEL Sharps, MD/MSCR

## 2024-02-17 NOTE — Anesthesia Preprocedure Evaluation (Signed)
 Anesthesia Evaluation  Patient identified by MRN, date of birth, ID band Patient awake    Reviewed: Allergy & Precautions, NPO status , Patient's Chart, lab work & pertinent test results  History of Anesthesia Complications Negative for: history of anesthetic complications  Airway Mallampati: III  TM Distance: >3 FB Neck ROM: full    Dental   Pulmonary asthma , COPD, former smoker   Pulmonary exam normal        Cardiovascular hypertension, On Medications + CAD, + Peripheral Vascular Disease and + DOE  Normal cardiovascular exam     Neuro/Psych  Headaches PSYCHIATRIC DISORDERS         GI/Hepatic Neg liver ROS,GERD  ,,  Endo/Other  negative endocrine ROSdiabetes, Type 2    Renal/GU      Musculoskeletal   Abdominal   Peds  Hematology  (+) Blood dyscrasia, anemia   Anesthesia Other Findings Past Medical History: No date: Acute posthemorrhagic anemia 2015: Allergy No date: Aortic atherosclerosis No date: Arthritis 2008: Asthma No date: Bilateral carotid artery disease No date: BPH (benign prostatic hyperplasia) No date: Complication of anesthesia     Comment:  c/o difficulty breathing after anesthesia No date: COPD (chronic obstructive pulmonary disease) (HCC) No date: Coronary artery disease No date: Dissection of left iliac artery No date: GERD (gastroesophageal reflux disease) No date: Hyperlipemia No date: Labile hypertension No date: Long term current use of clopidogrel  No date: Long-term use of aspirin  therapy 08/21/2019: Melena No date: PAD (peripheral artery disease) 08/19/2019: Paroxysmal supraventricular tachycardia 08/21/2019: Postural dizziness with presyncope No date: Renal disorder     Comment:  early stage CKD 07/2019: Splenic infarction s/p stent placement No date: T2DM (type 2 diabetes mellitus) (HCC) No date: Tobacco abuse No date: Ulcer 08/21/2019: Upper GI bleed  Past Surgical  History: No date: BACK SURGERY     Comment:  lumbar 08/24/2019: COLONOSCOPY WITH PROPOFOL ; N/A     Comment:  Procedure: COLONOSCOPY WITH PROPOFOL ;  Surgeon: Jinny Carmine, MD;  Location: [facility] ENDOSCOPY;  Service:               Endoscopy;  Laterality: N/A; 08/24/2019: ESOPHAGOGASTRODUODENOSCOPY (EGD) WITH PROPOFOL ; N/A     Comment:  Procedure: ESOPHAGOGASTRODUODENOSCOPY (EGD) WITH               PROPOFOL ;  Surgeon: Jinny Carmine, MD;  Location: [facility]               ENDOSCOPY;  Service: Endoscopy;  Laterality: N/A; 2008: SPINE SURGERY 07/2019: STENT PLACEMENT VASCULAR ([facility] HX)     Comment:  stenosis of distal splenic artery and stent placed 08/06/2019: VISCERAL ANGIOGRAPHY; N/A     Comment:  Procedure: VISCERAL ANGIOGRAPHY;  Surgeon: Jama Cordella MATSU, MD;  Location: [facility] INVASIVE CV LAB;  Service:              Cardiovascular;  Laterality: N/A;     Reproductive/Obstetrics negative OB ROS                              Anesthesia Physical Anesthesia Plan  ASA: 3  Anesthesia Plan: General ETT   Post-op Pain Management: Toradol  IV (intra-op)*, Ofirmev  IV (intra-op)* and Dilaudid  IV   Induction: Intravenous  PONV Risk Score and Plan: 2 and Ondansetron , Dexamethasone , Midazolam  and Treatment  may vary due to age or medical condition  Airway Management Planned: Oral ETT  Additional Equipment:   Intra-op Plan:   Post-operative Plan: Extubation in OR  Informed Consent: I have reviewed the patients History and Physical, chart, labs and discussed the procedure including the risks, benefits and alternatives for the proposed anesthesia with the patient or authorized representative who has indicated his/her understanding and acceptance.     Dental Advisory Given  Plan Discussed with: Anesthesiologist, CRNA and Surgeon  Anesthesia Plan Comments: (Patient consented for risks of anesthesia including but not limited to:  - adverse  reactions to medications - damage to eyes, teeth, lips or other oral mucosa - nerve damage due to positioning  - sore throat or hoarseness - Damage to heart, brain, nerves, lungs, other parts of body or loss of life  Patient voiced understanding and assent.)        Anesthesia Quick Evaluation

## 2024-02-17 NOTE — H&P (Signed)
 Referring Physician:  Claudene Penne ORN, MD [address] [place],  [place] [address]  Primary Physician:  Dineen Rollene MATSU, [name]  History of Present Illness Benjamin Conley is a 58 year old male with severe cervical stenosis who presents with worsening weakness and gait instability. He has severe cervical stenosis, particularly at the C5-6 level, with spinal cord compression at C3-4 and C4-5. A CT scan from January 10, 2024, shows multilevel spondylosis from C5 to C7 and osteophytes at C5-6 and C6-7, with the C5-6 level being the most affected. He experiences worsening weakness and gait instability, requiring a cane for ambulation. He feels wobbly and has episodes where his legs give out, leading to falls. He has difficulty straightening up after sitting due to back tightness, with a history of two lumbar fusions. His hands are weak, causing difficulty holding objects and frequent dropping. Severe cramping in his hands makes it difficult to straighten his fingers. He has a history of trigger finger, untreated due to diabetes.  Tobacco use: he quit in 2021   Bowel/Bladder Dysfunction: none   Conservative measures:  Physical therapy: Has participated at [facility] in [place] for shoulder pain not back or neck issues Multimodal medical therapy including regular antiinflammatories: Tylenol   Injections:   Lumbar epidural steroid injections per patient with [facility]-not sure what year   Past Surgery:  2009--Fusion L4-L5 at [facility] has no symptoms of cervical myelopathy.   The symptoms are causing a significant impact on the patient's life.     Exam: Physical Exam MUSCULOSKELETAL: Limited range of motion in both shoulders. Giveaway weakness in both shoulders. Elbow flexion strength 4/5 bilaterally. Elbow extension strength 4+ bilaterally. Wrist extension strength 4+ bilaterally. Grip strength 4/5 right, 4+ left. Intrinsic hand muscle strength 4/5 left, 4-  right. NEUROLOGICAL: Hyperreflexia in left brachioradialis and bicep. Trace reflex in left tricep. Hyperreflexia in right bicep brachioradialis. Trace reflex in right tricep. Positive Hoffman's and Tromner sign on right. Positive Hoffman sign on left. Hyperreflexia and spreading in right and left patella. 1+ reflex in left and right Achilles. Hyperreflexic in right patella. Cross adductor reflexes on right. Unsteady on feet, unable to perform tandem gait.    Imaging: Cervical MRI dated 12/29/23:  FINDINGS:   BONES AND ALIGNMENT: Normal alignment. Normal vertebral body heights. Bone marrow signal is unremarkable.   SPINAL CORD: Normal spinal cord size. No abnormal spinal cord signal.   SOFT TISSUES: No paraspinal mass.   C2-C3: No significant disc herniation. No spinal canal stenosis or neural foraminal narrowing.   C3-C4: Small central disc protrusion with moderate spinal canal stenosis indenting the ventral spinal cord. No neural foraminal narrowing.   C4-C5: Small central disc protrusion indenting the ventral spinal cord and causing moderate spinal canal stenosis. No neural foraminal stenosis.   C5-C6: Disc bulge with left subarticular protrusion superimposed and mild bilateral uncovertebral spurring. Severe spinal canal stenosis. Moderate bilateral neural foraminal stenosis.   C6-C7: Small disc bulge with uncovertebral hypertrophy. Mild spinal canal stenosis. Moderate left foraminal stenosis.   C7-T1: No significant disc herniation. No spinal canal stenosis or neural foraminal narrowing.   IMPRESSION: 1. C5-C6. Disc bulge with left subarticular protrusion and mild bilateral uncovertebral spurring causing severe spinal canal stenosis and moderate bilateral neural foraminal stenosis. 2. C3-C4. Small central disc protrusion with moderate spinal canal stenosis. 3. C4-C5. Small central disc protrusion with moderate spinal canal stenosis. 4. C6-C7. Small disc bulge with  uncovertebral hypertrophy causing  mild spinal canal stenosis and moderate left foraminal stenosis.   Electronically signed by: Franky Stanford MD 01/01/2024 02:46 AM EDT RP Workstation: [id number]  Results RADIOLOGY Cervical spine MRI: Severe stenosis at C5-C6, spinal cord compression at C3-C4 and C4-C5, myelomalacia at C4-C5 and C5-C6 (12/29/2023) Cervical spine CT: Multilevel spondylosis from C5 to C7, osteophytes at C5-C6 and C6-C7, large bone spur at C6 (01/10/2024) Cervical spine X-ray: Loss of cervical lordosis, no major instability, disc degeneration most severe at C5-C6, followed by C4-C5 (01/14/2024)    Assessment and Plan: Assessment & Plan Cervical spinal stenosis with myelopathy and myelomalacia, C4-C6 Severe stenosis with myelopathy and myelomalacia at C4-C6, significant compression at C5-6 and C4-5. Myelopathy due to spinal cord compression affecting arm and leg function. Surgical intervention recommended due to progressive symptoms and functional impairment.Gait instability and frequent falls secondary to cervical myelopathy. Bilateral shoulder weakness and limited range of motion secondary to cervical myelopathy - Proceed with two-level anterior cervical discectomy and fusion from C4 to C6. - Discussed risks of surgery including spinal cord injury (1-5%) and C5 palsy (5-15%), as well as other postoperative complications such as sore throat and hardware issues.   Bilateral shoulder weakness and limited range of motion secondary to cervical myelopathy, but also given his multiple recent falls potentially shoulder pathology present - Recommend further evaluation of shoulder function post-surgery.  Penne MICAEL Sharps, MD  neurosurgery

## 2024-02-18 ENCOUNTER — Encounter: Payer: Self-pay | Admitting: Oncology

## 2024-02-18 ENCOUNTER — Encounter: Payer: Self-pay | Admitting: Neurosurgery

## 2024-02-18 ENCOUNTER — Other Ambulatory Visit: Payer: Self-pay

## 2024-02-18 DIAGNOSIS — M4712 Other spondylosis with myelopathy, cervical region: Secondary | ICD-10-CM | POA: Diagnosis not present

## 2024-02-18 LAB — GLUCOSE, CAPILLARY
Glucose-Capillary: 206 mg/dL — ABNORMAL HIGH (ref 70–99)
Glucose-Capillary: 219 mg/dL — ABNORMAL HIGH (ref 70–99)

## 2024-02-18 MED ORDER — ACETAMINOPHEN 500 MG PO TABS: 1000.0000 mg | ORAL_TABLET | Freq: Four times a day (QID) | ORAL | Status: AC | PRN

## 2024-02-18 MED ORDER — POLYETHYLENE GLYCOL 3350 17 GM/SCOOP PO POWD
17.0000 g | Freq: Every day | ORAL | 0 refills | Status: DC | PRN
  Filled 2024-02-18: qty 238, 14d supply, fill #0

## 2024-02-18 MED ORDER — OXYCODONE HCL 5 MG PO TABS
5.0000 mg | ORAL_TABLET | ORAL | 0 refills | Status: AC | PRN
  Filled 2024-02-18: qty 15, 3d supply, fill #0

## 2024-02-18 MED ORDER — METHOCARBAMOL 500 MG PO TABS
500.0000 mg | ORAL_TABLET | Freq: Four times a day (QID) | ORAL | 0 refills | Status: DC | PRN
  Filled 2024-02-18: qty 30, 8d supply, fill #0

## 2024-02-18 MED ORDER — SENNA 8.6 MG PO TABS
1.0000 | ORAL_TABLET | Freq: Two times a day (BID) | ORAL | 0 refills | Status: DC | PRN
  Filled 2024-02-18: qty 14, 7d supply, fill #0

## 2024-02-18 NOTE — Plan of Care (Signed)
 Problem: Education: Goal: Knowledge of General Education information will improve Description: Including pain rating scale, medication(s)/side effects and non-pharmacologic comfort measures Outcome: Adequate for Discharge   Problem: Health Behavior/Discharge Planning: Goal: Ability to manage health-related needs will improve Outcome: Adequate for Discharge   Problem: Clinical Measurements: Goal: Ability to maintain clinical measurements within normal limits will improve Outcome: Adequate for Discharge Goal: Will remain free from infection Outcome: Adequate for Discharge Goal: Diagnostic test results will improve Outcome: Adequate for Discharge Goal: Respiratory complications will improve Outcome: Adequate for Discharge Goal: Cardiovascular complication will be avoided Outcome: Adequate for Discharge   Problem: Activity: Goal: Risk for activity intolerance will decrease Outcome: Adequate for Discharge   Problem: Nutrition: Goal: Adequate nutrition will be maintained Outcome: Adequate for Discharge   Problem: Coping: Goal: Level of anxiety will decrease Outcome: Adequate for Discharge   Problem: Elimination: Goal: Will not experience complications related to bowel motility Outcome: Adequate for Discharge Goal: Will not experience complications related to urinary retention Outcome: Adequate for Discharge   Problem: Pain Managment: Goal: General experience of comfort will improve and/or be controlled Outcome: Adequate for Discharge   Problem: Safety: Goal: Ability to remain free from injury will improve Outcome: Adequate for Discharge   Problem: Skin Integrity: Goal: Risk for impaired skin integrity will decrease Outcome: Adequate for Discharge   Problem: Education: Goal: Ability to describe self-care measures that may prevent or decrease complications (Diabetes Survival Skills Education) will improve Outcome: Adequate for Discharge Goal: Individualized Educational  Video(s) Outcome: Adequate for Discharge   Problem: Coping: Goal: Ability to adjust to condition or change in health will improve Outcome: Adequate for Discharge   Problem: Fluid Volume: Goal: Ability to maintain a balanced intake and output will improve Outcome: Adequate for Discharge   Problem: Health Behavior/Discharge Planning: Goal: Ability to identify and utilize available resources and services will improve Outcome: Adequate for Discharge Goal: Ability to manage health-related needs will improve Outcome: Adequate for Discharge   Problem: Metabolic: Goal: Ability to maintain appropriate glucose levels will improve Outcome: Adequate for Discharge   Problem: Nutritional: Goal: Maintenance of adequate nutrition will improve Outcome: Adequate for Discharge Goal: Progress toward achieving an optimal weight will improve Outcome: Adequate for Discharge   Problem: Skin Integrity: Goal: Risk for impaired skin integrity will decrease Outcome: Adequate for Discharge   Problem: Tissue Perfusion: Goal: Adequacy of tissue perfusion will improve Outcome: Adequate for Discharge   Problem: Education: Goal: Ability to verbalize activity precautions or restrictions will improve Outcome: Adequate for Discharge Goal: Knowledge of the prescribed therapeutic regimen will improve Outcome: Adequate for Discharge Goal: Understanding of discharge needs will improve Outcome: Adequate for Discharge   Problem: Activity: Goal: Ability to avoid complications of mobility impairment will improve Outcome: Adequate for Discharge Goal: Ability to tolerate increased activity will improve Outcome: Adequate for Discharge Goal: Will remain free from falls Outcome: Adequate for Discharge   Problem: Bowel/Gastric: Goal: Gastrointestinal status for postoperative course will improve Outcome: Adequate for Discharge   Problem: Clinical Measurements: Goal: Ability to maintain clinical measurements  within normal limits will improve Outcome: Adequate for Discharge Goal: Postoperative complications will be avoided or minimized Outcome: Adequate for Discharge Goal: Diagnostic test results will improve Outcome: Adequate for Discharge   Problem: Pain Management: Goal: Pain level will decrease Outcome: Adequate for Discharge   Problem: Skin Integrity: Goal: Will show signs of wound healing Outcome: Adequate for Discharge   Problem: Health Behavior/Discharge Planning: Goal: Identification of resources available to assist in  meeting health care needs will improve Outcome: Adequate for Discharge   Problem: Bladder/Genitourinary: Goal: Urinary functional status for postoperative course will improve Outcome: Adequate for Discharge

## 2024-02-18 NOTE — Evaluation (Signed)
 Physical Therapy Evaluation Patient Details Name: Benjamin Conley MRN: [id number] DOB: 1965-04-30 Today's Date: 02/18/2024  History of Present Illness  58 year old man with a history of progressive cervical myeloradiculopathy found to have multilevel cervical stenosis  Clinical Impression  Patient noted to be in supine position at PT arrival in room, for an initial PT evaluation due to a decline in functional status, with baseline mobility reported as modI with history of falls, and currently requiring minA/CGA for ambulation. The patient is A&O x 4, presenting with good willingness to work with PT and goals of going to rehab. The patient resides in a house with family/friends with support. There are no STE inside the residence. Gait was assessed with SPC. Gait mechanic observations noted moderate lateral sway during loading phase R>L stance and mild to moderate unsteadiness depending on self selected gait speed. The overall clinical impression is that the patient presents with moderate mobility limitations secondary to cervical fusion. Recommended skilled PT will address safety, mobility, and discharge planning.     If plan is discharge home, recommend the following: A little help with walking and/or transfers;A little help with bathing/dressing/bathroom;Help with stairs or ramp for entrance;Assist for transportation   Can travel by private vehicle        Equipment Recommendations None recommended by PT  Recommendations for Other Services       Functional Status Assessment Patient has had a recent decline in their functional status and demonstrates the ability to make significant improvements in function in a reasonable and predictable amount of time.     Precautions / Restrictions Precautions Precautions: Fall;Cervical Precaution Booklet Issued: Yes (comment) Recall of Precautions/Restrictions: Intact Precaution/Restrictions Comments: cervical fusion Restrictions Weight Bearing  Restrictions Per Provider Order: No      Mobility  Bed Mobility Overal bed mobility: Modified Independent (light use of handrails on bed)                  Transfers Overall transfer level: Needs assistance Equipment used: Rolling walker (2 wheels) Transfers: Sit to/from Stand Sit to Stand: Supervision, Modified independent (Device/Increase time)           General transfer comment: mild unsteadiness with initial standing    Ambulation/Gait Ambulation/Gait assistance: Contact guard assist, Min assist Gait Distance (Feet): 220 Feet Assistive device: Straight cane Gait Pattern/deviations: Step-to pattern, Drifts right/left, Trendelenburg, Wide base of support, Ataxic Gait velocity: decreased; limited ability to increase gait speed without furthering therapist, nutritional faults     General Gait Details: lateral sway during loading bilaterally R>L; unsteadiness noted as gait distance increased  Stairs            Wheelchair Mobility     Tilt Bed    Modified Rankin (Stroke Patients Only)       Balance Overall balance assessment: Needs assistance Sitting-balance support: Feet supported, Single extremity supported Sitting balance-Leahy Scale: Normal     Standing balance support: During functional activity Standing balance-Leahy Scale: Good                               Pertinent Vitals/Pain Pain Assessment Pain Assessment: Faces Faces Pain Scale: Hurts little more Pain Location: neck Pain Intervention(s): Monitored during session    Home Living Family/patient expects to be discharged to:: Private residence Living Arrangements: Other relatives;Non-relatives/Friends Available Help at Discharge: Family;Friend(s)   Home Access: Stairs to enter;Ramped entrance       Home Layout: One level Home  Equipment: Cane - single point;Shower Counsellor (2 wheels);Grab bars - toilet Additional Comments: lives with brother his daughter her  boyfriend    Prior Function Prior Level of Function : Independent/Modified Independent             Mobility Comments: independent with history of falls ADLs Comments: modI with ADLs/iADLs with hx of falling in the bathtub     Extremity/Trunk Assessment   Upper Extremity Assessment Upper Extremity Assessment: Defer to OT evaluation    Lower Extremity Assessment Lower Extremity Assessment: Generalized weakness;RLE deficits/detail RLE Deficits / Details: R hip weakness; increasing R hip pain with gait    Cervical / Trunk Assessment Cervical / Trunk Assessment: Neck Surgery  Communication   Communication Communication: No apparent difficulties    Cognition Arousal: Alert Behavior During Therapy: WFL for tasks assessed/performed   PT - Cognitive impairments: No apparent impairments                         Following commands: Intact       Cueing Cueing Techniques: Verbal cues     General Comments      Exercises     Assessment/Plan    PT Assessment Patient needs continued PT services  PT Problem List Decreased strength;Decreased activity tolerance;Decreased mobility;Decreased range of motion       PT Treatment Interventions Gait training;Stair training;DME instruction;Functional mobility training;Therapeutic activities;Therapeutic exercise;Manual techniques;Patient/family education;Neuromuscular re-education;Balance training    PT Goals (Current goals can be found in the Care Plan section)  Acute Rehab PT Goals Patient Stated Goal: Pt wants to get home to get rehab PT Goal Formulation: With patient Time For Goal Achievement: 03/03/24 Potential to Achieve Goals: Good    Frequency Min 2X/week     Co-evaluation               AM-PAC PT 6 Clicks Mobility  Outcome Measure Help needed turning from your back to your side while in a flat bed without using bedrails?: None Help needed moving from lying on your back to sitting on the side of a  flat bed without using bedrails?: None Help needed moving to and from a bed to a chair (including a wheelchair)?: None Help needed standing up from a chair using your arms (e.g., wheelchair or bedside chair)?: None Help needed to walk in hospital room?: None Help needed climbing 3-5 steps with a railing? : A Little 6 Click Score: 23    End of Session Equipment Utilized During Treatment: Gait belt Activity Tolerance: Patient tolerated treatment well Patient left: in bed;with bed alarm set;with call bell/phone within reach Nurse Communication: Mobility status PT Visit Diagnosis: Unsteadiness on feet (R26.81);Other abnormalities of gait and mobility (R26.89);Muscle weakness (generalized) (M62.81);Difficulty in walking, not elsewhere classified (R26.2)    Time: 8996-8980 PT Time Calculation (min) (ACUTE ONLY): 16 min   Charges:   PT Evaluation $PT Eval Low Complexity: 1 Low   PT General Charges $$ ACUTE PT VISIT: 1 Visit         Sherlean Lesches DPT, PT    Sherlean A Marlynn Hinckley 02/18/2024, 10:44 AM

## 2024-02-18 NOTE — TOC Initial Note (Signed)
 Transition of Care [facility]) - Initial/Assessment Note    Patient Details  Name: Benjamin Conley MRN: [id number] Date of Birth: Aug 07, 1965  Transition of Care [facility]) CM/SW Contact:    Daved JONETTA Hamilton, RN Phone Number: 02/18/2024, 1:41 PM  Clinical Narrative:                  Spoke with patient via telephone as he is already discharged from the hospital, introduced self and explained role. Patient verbalized he lives in a home with his brother, niece and her s/o. Patient verbalized his PCP is Rollene Flatness NP-C with [facility]. Patient verbalized he is independent in all activities and needs. Discussed with patient recommendations for him to receive [facility] PT/OT. Patient verbalized understanding and agreement to this. This CM presented patient with choice, patient verbalized he has no preference of agency. This CM advised will send out [facility] referrals and contact him back with offer(s), patient verbalized understanding and agreement to this plan.  Referrals sent in HUB  Expected Discharge Plan: Home w Home Health Services Barriers to Discharge: Barriers Resolved   Patient Goals and CMS Choice     Choice offered to / list presented to : Patient      Expected Discharge Plan and Services   Discharge Planning Services: CM Consult Post Acute Care Choice: Home Health Living arrangements for the past 2 months: Single Family Home Expected Discharge Date: 02/18/24                                    Prior Living Arrangements/Services Living arrangements for the past 2 months: Single Family Home Lives with:: Relatives Patient language and need for interpreter reviewed:: Yes Do you feel safe going back to the place where you live?: Yes      Need for Family Participation in Patient Care: No (Comment) Care giver support system in place?: Yes (comment)   Criminal Activity/Legal Involvement Pertinent to Current Situation/Hospitalization: No - Comment as needed  Activities of Daily  Living   ADL Screening (condition at time of admission) Independently performs ADLs?: Yes (appropriate for developmental age) Is the patient deaf or have difficulty hearing?: No Does the patient have difficulty seeing, even when wearing glasses/contacts?: No Does the patient have difficulty concentrating, remembering, or making decisions?: No  Permission Sought/Granted Permission sought to share information with : Case Manager, Magazine Features Editor Permission granted to share information with : Yes, Verbal Permission Granted              Emotional Assessment Appearance:: Appears older than stated age Attitude/Demeanor/Rapport: Engaged Affect (typically observed): Appropriate Orientation: : Oriented to Self, Oriented to Place, Oriented to  Time, Oriented to Situation Alcohol / Substance Use: Not Applicable Psych Involvement: No (comment)  Admission diagnosis:  Cervical spondylosis [M47.812] Spinal stenosis in cervical region [M48.02] Spondylosis, cervical, with myelopathy [M47.12] Cervical myelopathy (HCC) [G95.9] Patient Active Problem List   Diagnosis Date Noted   Cervical myelopathy (HCC) 02/17/2024   Cervical spondylosis 01/14/2024   Spinal stenosis in cervical region 01/14/2024   Spondylosis, cervical, with myelopathy 01/14/2024   Shoulder injury, sequela 01/14/2024   DOE (dyspnea on exertion) 10/10/2023   Back pain 10/07/2023   Chest tightness 09/23/2023   DKA (diabetic ketoacidosis) (HCC) 09/19/2023   COPD with acute exacerbation (HCC) 09/19/2023   Hyperglycemia 09/19/2023   AKI (acute kidney injury) 09/19/2023   Headache 09/01/2023   Toe ulcer due to DM (  HCC) 09/01/2023   Tremor of both hands 08/02/2023   Cerebral ischemia 07/24/2023   Left shoulder pain 07/07/2023   Dysphagia 06/12/2023   History of medication noncompliance 06/12/2023   Open toe wound 10/30/2022   PAD (peripheral artery disease) 07/19/2022   Anemia 05/15/2022   Photophobia of  both eyes 02/20/2022   Bilateral hand pain 08/13/2021   Dizziness 06/11/2021   Sinus congestion 06/11/2021   Pulmonary emphysema (HCC) 05/07/2021   GERD (gastroesophageal reflux disease) 01/12/2021   Screening for blood or protein in urine 06/16/2020   Contact dermatitis 06/16/2020   Leukocytosis 01/30/2020   Nicotine  use disorder 01/27/2020   Slow transit constipation 01/27/2020   Nail avulsion of toe, initial encounter 12/09/2019   Benign prostatic hyperplasia with hesitancy 10/30/2019   Abnormal ejaculation 10/21/2019   B12 deficiency 10/21/2019   Rectal pressure 10/21/2019   Thrombosis of splenic artery (HCC) 10/03/2019   Abnormal brain MRI 09/22/2019   Difficulty sleeping 09/22/2019   Tinnitus of both ears 09/22/2019   Pain due to onychomycosis of toenails of both feet 09/06/2019   Coagulation disorder 09/06/2019   Iron  deficiency anemia due to chronic blood loss 08/29/2019   Thrombocytosis 08/24/2019   Angiodysplasia of intestinal tract    Black stool 08/22/2019   Presence of arterial stent - splenic artery 08/21/2019   Orthostatic hypotension 08/19/2019   Tachycardia 08/19/2019   Chest pain 08/19/2019   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   COPD with chronic bronchitis (HCC) 08/01/2019   Tobacco abuse 08/01/2019   Diabetes mellitus without complication (HCC)    Hyperlipidemia    Impingement syndrome of shoulder region 03/30/2018   Hypertension 11/10/2013   PCP:  Dineen Rollene MATSU, [name] Pharmacy:   [facility] [address] [place] - [address] [place] [address] Phone: [phone] Fax: [phone]  [facility] [address] [place] [address] Phone: [phone] Fax: [phone]     Social Drivers of Health (SDOH) Social History: SDOH Screenings   Food Insecurity: No Food Insecurity (02/17/2024)  Recent Concern: Food Insecurity - Food Insecurity Present (01/19/2024)  Housing: Low  Risk  (02/17/2024)  Transportation Needs: No Transportation Needs (02/17/2024)  Utilities: Not At Risk (02/17/2024)  Alcohol Screen: Low Risk  (10/31/2023)  Depression (PHQ2-9): Low Risk  (11/04/2023)  Financial Resource Strain: Medium Risk (01/19/2024)  Physical Activity: Insufficiently Active (01/19/2024)  Social Connections: Socially Isolated (02/17/2024)  Stress: No Stress Concern Present (01/19/2024)  Tobacco Use: Medium Risk (02/17/2024)  Health Literacy: Adequate Health Literacy (10/31/2023)   SDOH Interventions:     Readmission Risk Interventions     No data to display

## 2024-02-18 NOTE — Evaluation (Signed)
 Occupational Therapy Evaluation Patient Details Name: Benjamin Conley MRN: [id number] DOB: 1965/04/10 Today's Date: 02/18/2024   History of Present Illness   58 year old man with a history of progressive cervical myeloradiculopathy found to have multilevel cervical stenosis     Clinical Impressions Patient was seen for OT evaluation this date. Prior to hospital admission, patient was independent. Patient lives in single story home with several family members who are able to provide support. OT educated patient on cervical spine precautions with relation to ADL performance and bed mobility with good return demo. Patient demonstrates ADLs tasks without A.  OT noted 3/5 grip in LUE vs 4/5 in RUE; all other strength WNL and sensation is WNL. Patient presents with deficits in dynamic standing balance, UE strength, affecting safe and optimal ADL completion. Patient is currently requiring no A for ADLs but would benefit from OT in the home to ensure safety with environmental set up.  Paient would benefit from skilled OT services to address noted impairments and functional limitations (see below for any additional details) in order to maximize safety and independence while minimizing future risk of falls, injury, and readmission. Anticipate the need for follow up OT services upon acute hospital DC.      If plan is discharge home, recommend the following:   A little help with walking and/or transfers     Functional Status Assessment   Patient has had a recent decline in their functional status and demonstrates the ability to make significant improvements in function in a reasonable and predictable amount of time.     Equipment Recommendations   None recommended by OT     Recommendations for Other Services         Precautions/Restrictions   Precautions Precautions: Fall;Cervical Recall of Precautions/Restrictions: Intact Precaution/Restrictions Comments: cervical  fusion Restrictions Weight Bearing Restrictions Per Provider Order: No     Mobility Bed Mobility Overal bed mobility: Modified Independent                  Transfers Overall transfer level: Needs assistance Equipment used: Rolling walker (2 wheels) Transfers: Sit to/from Stand Sit to Stand: Supervision, Modified independent (Device/Increase time)           General transfer comment: mild unsteadiness with initial standing      Balance Overall balance assessment: Needs assistance Sitting-balance support: Feet supported, Single extremity supported Sitting balance-Leahy Scale: Normal     Standing balance support: During functional activity Standing balance-Leahy Scale: Good                             ADL either performed or assessed with clinical judgement   ADL Overall ADL's : Modified independent                                       General ADL Comments: able to perform all parts of ADLs, cues for maintaining cervical precautions with good return demo     Vision         Perception         Praxis         Pertinent Vitals/Pain Pain Assessment Pain Assessment: 0-10 Pain Location: neck Pain Descriptors / Indicators: Aching Pain Intervention(s): Monitored during session     Extremity/Trunk Assessment Upper Extremity Assessment Upper Extremity Assessment: Overall WFL for tasks assessed   Lower Extremity Assessment Lower  Extremity Assessment: Generalized weakness RLE Deficits / Details: R hip weakness; increasing R hip pain with gait   Cervical / Trunk Assessment Cervical / Trunk Assessment: Neck Surgery   Communication Communication Communication: No apparent difficulties   Cognition Arousal: Alert Behavior During Therapy: WFL for tasks assessed/performed                                 Following commands: Intact       Cueing  General Comments   Cueing Techniques: Verbal cues       Exercises     Shoulder Instructions      Home Living Family/patient expects to be discharged to:: Private residence Living Arrangements: Other relatives;Non-relatives/Friends Available Help at Discharge: Family;Friend(s) Type of Home: House Home Access: Stairs to enter;Ramped entrance     Home Layout: One level     Bathroom Shower/Tub: Chief Strategy Officer: Standard Bathroom Accessibility: No   Home Equipment: Cane - single point;Shower Counsellor (2 wheels);Grab bars - toilet   Additional Comments: lives with brother his daughter her boyfriend      Prior Functioning/Environment Prior Level of Function : Independent/Modified Independent             Mobility Comments: independent with history of falls ADLs Comments: modI with ADLs/iADLs with hx of falling in the bathtub    OT Problem List: Decreased strength;Decreased activity tolerance   OT Treatment/Interventions: Self-care/ADL training;Therapeutic exercise;Energy conservation      OT Goals(Current goals can be found in the care plan section)   Acute Rehab OT Goals Patient Stated Goal: to go home OT Goal Formulation: With patient Time For Goal Achievement: 03/03/24 Potential to Achieve Goals: Good ADL Goals Pt Will Perform Grooming: with modified independence;standing Pt Will Perform Lower Body Dressing: with modified independence;sit to/from stand Pt Will Transfer to Toilet: with modified independence;regular height toilet Pt Will Perform Toileting - Clothing Manipulation and hygiene: with modified independence;sit to/from stand   OT Frequency:  Min 2X/week    Co-evaluation              AM-PAC OT 6 Clicks Daily Activity     Outcome Measure Help from another person eating meals?: None Help from another person taking care of personal grooming?: None Help from another person toileting, which includes using toliet, bedpan, or urinal?: None Help from another person bathing  (including washing, rinsing, drying)?: None Help from another person to put on and taking off regular upper body clothing?: None Help from another person to put on and taking off regular lower body clothing?: None 6 Click Score: 24   End of Session Equipment Utilized During Treatment: Gait belt;Other (comment) [facility]) Nurse Communication: Mobility status  Activity Tolerance: Patient tolerated treatment well Patient left: in bed;with call bell/phone within reach;with bed alarm set  OT Visit Diagnosis: Unsteadiness on feet (R26.81);Other abnormalities of gait and mobility (R26.89)                Time: 1003-1019 OT Time Calculation (min): 16 min Charges:  OT General Charges $OT Visit: 1 Visit OT Evaluation $OT Eval Low Complexity: 1 Low  Rogers Clause, OT/L [facility], 02/18/2024

## 2024-02-18 NOTE — Plan of Care (Signed)

## 2024-02-18 NOTE — Discharge Summary (Signed)
 Discharge Summary  Patient ID: Benjamin Conley MRN: [id number] DOB/AGE: [birth year] 58 y.o.  Admit date: 02/17/2024 Discharge date: 02/18/2024  Admission Diagnoses: Cervical spondylosis with myelopathy  Discharge Diagnoses:  Principal Problem:   Cervical myelopathy (HCC) Active Problems:   Cervical spondylosis   Spinal stenosis in cervical region   Spondylosis, cervical, with myelopathy   Discharged Condition: good  Hospital Course:  Benjamin Conley is a 58 y.o presenting with cervical stenosis and myelopathy s/p C4-6 ACDF. His intraoperative course was uncomplicated and he was admitted overnight for pain control and therapy evaluation. He was seen by therapy and deemed appropriate for discharge home on POD1 with [facility] services. His pain was largely controlled with very minimal medications. We was given prescriptions for Oxycodone , Robaxin , Senna, and Miralax  to take as needed  Consults: None  Significant Diagnostic Studies: NA  Treatments: surgery: as above. Please see separately dictated operative report for further details   Discharge Exam: Blood pressure 117/84, pulse (!) 115, temperature 97.7 F (36.5 C), resp. rate 17, SpO2 97%. AA Ox3 CNI   Strength: Side Biceps Triceps Deltoid Interossei Grip Wrist Ext. Wrist Flex.  R 4 4+ 4- 4- 4 4+ 5  L 4 4+ 4- 4 4+ 4+ 5    Side Iliopsoas Quads Hamstring PF DF EHL  R 5 5 5 5 5 5   L 5 5 5 5 5 5     Incision: c/d/I with dermabond in place   Disposition: Discharge disposition: 06-Home-Health Care Svc       Discharge Instructions     Incentive spirometry RT   Complete by: As directed    No wound care   Complete by: As directed       Allergies as of 02/18/2024       Reactions   Wasp Venom Anaphylaxis   Wasp Venom Protein Anaphylaxis   Coffee Flavoring Agent (non-screening) Nausea And Vomiting   Patient states coffee gives him nausea and stomach cramps   Onion Nausea And Vomiting, Nausea Only   Prednisone  Other  (See Comments)   DKA after IV solu medrol  following by oral prednisone         Medication List     PAUSE taking these medications    clopidogrel  75 MG tablet Wait to take this until your doctor or other care provider tells you to start again. Commonly known as: PLAVIX  Take 1 tablet by mouth once daily       STOP taking these medications    famotidine  20 MG tablet Commonly known as: Pepcid    ipratropium-albuterol  0.5-2.5 (3) MG/3ML Soln Commonly known as: DUONEB   Ohtuvayre  3 MG/2.5ML Susp Generic drug: Ensifentrine    silodosin  8 MG Caps capsule Commonly known as: RAPAFLO        TAKE these medications    Accu-Chek Guide Test test strip Generic drug: glucose blood USE 1  IN THE MORNING THEN 1 AT NOON AND 1 AT BEDTIME   acetaminophen  500 MG tablet Commonly known as: TYLENOL  Take 2 tablets (1,000 mg total) by mouth every 6 (six) hours as needed. What changed:  when to take this reasons to take this   albuterol  108 (90 Base) MCG/ACT inhaler Commonly known as: VENTOLIN  HFA Inhale 2 puffs into the lungs every 6 (six) hours as needed for wheezing or shortness of breath.   aspirin  EC 81 MG tablet Take 1 tablet (81 mg total) by mouth daily.   atorvastatin  40 MG tablet Commonly known as: LIPITOR Take 0.5 tablets (20 mg total) by  mouth daily.   blood glucose meter kit and supplies Dispense based on patient and insurance preference. Use up to four times daily as directed. (FOR ICD-10 E10.9, E11.9).   Blood Glucose Monitoring Suppl Devi 1 each by Does not apply route in the morning, at noon, and at bedtime. May substitute to any manufacturer covered by patient's insurance.   budesonide -glycopyrrolate -formoterol  160-9-4.8 MCG/ACT Aero inhaler Commonly known as: BREZTRI  Inhale 2 puffs into the lungs 2 (two) times daily. What changed:  when to take this reasons to take this   Dexcom G7 Sensor Misc CHECK SUGARS CONTINUOUSLY   digoxin  0.125 MG tablet Commonly  known as: LANOXIN  Take 0.125 mg by mouth in the morning.   empagliflozin  25 MG Tabs tablet Commonly known as: Jardiance  Take 1 tablet (25 mg total) by mouth daily before breakfast.   methocarbamol  500 MG tablet Commonly known as: ROBAXIN  Take 1 tablet (500 mg total) by mouth every 6 (six) hours as needed for muscle spasms.   midodrine  10 MG tablet Commonly known as: PROAMATINE  Take 10 mg by mouth in the morning.   oxyCODONE  5 MG immediate release tablet Commonly known as: Oxy IR/ROXICODONE  Take 1 tablet (5 mg total) by mouth every 4 (four) hours as needed for up to 3 days for moderate pain (pain score 4-6) or severe pain (pain score 7-10).   Ozempic  (2 MG/DOSE) 8 MG/3ML Sopn Generic drug: Semaglutide  (2 MG/DOSE) INJECT 2MG  SUBCUTANEOUSLY ONCE WEEKLY What changed:  how much to take how to take this when to take this   polyethylene glycol powder 17 GM/SCOOP powder Commonly known as: GLYCOLAX /MIRALAX  Take 17 g by mouth daily as needed for moderate constipation. Dissolve 1 capful (17g) in 4-8 ounces of liquid and take by mouth daily.   senna 8.6 MG Tabs tablet Commonly known as: SENOKOT Take 1 tablet (8.6 mg total) by mouth 2 (two) times daily as needed for mild constipation.   triamcinolone  cream 0.1 % Commonly known as: KENALOG  Apply 1 application topically 2 (two) times daily. What changed:  when to take this reasons to take this         Signed: Edsel Jama Goods 02/18/2024, 1:03 PM

## 2024-02-18 NOTE — Inpatient Diabetes Management (Addendum)
 Inpatient Diabetes Program Recommendations  AACE/ADA: New Consensus Statement on Inpatient Glycemic Control   Target Ranges:  Prepandial:   less than 140 mg/dL      Peak postprandial:   less than 180 mg/dL (1-2 hours)      Critically ill patients:  140 - 180 mg/dL    Latest Reference Range & Units 02/17/24 11:21 02/17/24 16:17 02/17/24 21:36 02/18/24 08:14  Glucose-Capillary 70 - 99 mg/dL 847 (H) 859 (H) 807 (H) 206 (H)   Review of Glycemic Control  Diabetes history: DM2 Outpatient Diabetes medications: Jardiance  25 mg daily, Ozempic  2 mg QWeek, [employer] 3 Current orders for Inpatient glycemic control: Jardiance  25 mg QAM, Novolog  0-15 units TID with meals, Novolog  0-5 units QHS  NOTE: Patient admitted following cervical surgery on 02/17/24. Noted patient received Decadron  5 mg at 13:44 on 02/17/24. Inpatient diabetes coordinator consult for CGM. Spoke with patient over the phone to inquire about CGM. Patient states that he is taking Jardiance  and Ozempic  for DM control and he has been using the [employer] 3 for glucose monitoring. Patient reports that his insurance will not cover the [employer] [place] 3 sensors so using the CGM sensors were temporary. Informed patient that our team has some samples of the sensors so I will bring him a couple sensor samples so he can continue to follow glucose trends closely especially following surgery. Informed patient that he received Decadron  on 12/9 which is a steroid and will likely keep glucose elevated for at least 24-48 hours.  Patient states that he has no questions or concerns at this time and appreciative of call and getting sensor samples.    Addendum 02/18/24@11 :45-Provided patient with FreeStyle [place] 3 Plus sensor samples (2).  Thanks, Earnie Gainer, RN, MSN, CDCES Diabetes Coordinator Inpatient Diabetes Program [phone] (Team Pager from 8am to 5pm)

## 2024-02-18 NOTE — Progress Notes (Signed)
° °  Neurosurgery Progress Note  History: Benjamin Conley is s/p C4-6 ACDF.  POD1: Pt doing well this morning despite posterior neck pain as expected. He tolerated a largely regular diet overnight   Physical Exam: Vitals:   02/17/24 2316 02/18/24 0524  BP: (!) 135/92 131/81  Pulse: (!) 121 (!) 114  Resp: 18 17  Temp: (!) 97.4 F (36.3 C) 97.7 F (36.5 C)  SpO2: 97% 99%    AA Ox3 CNI  Strength: Side Biceps Triceps Deltoid Interossei Grip Wrist Ext. Wrist Flex.  R 4 4+ 4- 4- 4 4+ 5  L 4 4+ 4- 4 4+ 4+ 5    Side Iliopsoas Quads Hamstring PF DF EHL  R 5 5 5 5 5 5   L 5 5 5 5 5 5     Incision: c/d/I with dermabond in place   Data:  Other tests/results: NA  Assessment/Plan:  Benjamin Conley is a 58 y.o presenting with cervical myelopathy and weakness s/p C4-6 ACDF.   - mobilize - pain control - DVT prophylaxis - PTOT; dispo pending final recommendations  Edsel Goods PA-C Department of Neurosurgery

## 2024-02-19 ENCOUNTER — Encounter: Payer: Self-pay | Admitting: Neurosurgery

## 2024-02-29 NOTE — Progress Notes (Deleted)
" ° °  REFERRING PHYSICIAN:  Dineen Rollene MATSU, [name] [address] [place],  [place] [address]  DOS: 02/17/24   ACDF C4-C6  HISTORY OF PRESENT ILLNESS: Benjamin Conley is 2 weeks status post above surgery. Given oxycodone  and robaxin  on discharge from the hospital.    Restart PLAVIX  03/02/24?***  He has only been taking tylenol  for pain and prn robaxin .    PHYSICAL EXAMINATION:  NEUROLOGICAL:  General: In no acute distress.   Awake, alert, oriented to person, place, and time.  Pupils equal round and reactive to light.  Facial tone is symmetric.    Strength: Side Biceps Triceps Deltoid Interossei Grip Wrist Ext. Wrist Flex.  R 4*** 4+*** 4-*** 4-*** 4*** 4+*** 5  L 4*** 4+*** 4-*** 4*** 4+*** 4+*** 5   Side Iliopsoas Quads Hamstring PF DF EHL  R 5 5 5 5 5 5   L 5 5 5 5 5 5    Incision c/d/i  Imaging:  Nothing new to review.   Assessment / Plan: Benjamin Conley is doing well s/p above surgery. Treatment options reviewed with patient and following plan made:   - We discussed activity escalation and I have advised the patient to lift up to 10 pounds until 6 weeks after surgery (until follow up with Dr. Claudene).  - Reviewed wound care.  - He can restart PLAVIX  on 03/02/24.*** - Continue current medications including *** - Follow up as scheduled in 4 weeks and prn.   Advised to contact the office if any questions or concerns arise.   Glade Boys PA-C Dept of Neurosurgery  "

## 2024-02-29 NOTE — Anesthesia Postprocedure Evaluation (Signed)
"   Anesthesia Post Note  Patient: Therapist, Occupational  Procedure(s) Performed: Anterior cervical discectomy and fusion, C4-C6  Patient location during evaluation: PACU Anesthesia Type: General Level of consciousness: awake and alert Pain management: pain level controlled Vital Signs Assessment: post-procedure vital signs reviewed and stable Respiratory status: spontaneous breathing, nonlabored ventilation, respiratory function stable and patient connected to nasal cannula oxygen Cardiovascular status: blood pressure returned to baseline and stable Postop Assessment: no apparent nausea or vomiting Anesthetic complications: no   No notable events documented.   Last Vitals:  Vitals:   02/18/24 0745 02/18/24 0817  BP:  117/84  Pulse: (!) 116 (!) 115  Resp:  17  Temp:  36.5 C  SpO2:  97%    Last Pain:  Vitals:   02/18/24 1148  TempSrc:   PainSc: 5                  Prentice Murphy      "

## 2024-03-01 ENCOUNTER — Encounter: Admitting: Orthopedic Surgery

## 2024-03-01 DIAGNOSIS — M4802 Spinal stenosis, cervical region: Secondary | ICD-10-CM

## 2024-03-01 DIAGNOSIS — Z981 Arthrodesis status: Secondary | ICD-10-CM

## 2024-03-08 ENCOUNTER — Ambulatory Visit: Admitting: Neurosurgery

## 2024-03-08 VITALS — BP 124/78 | Temp 97.6°F | Ht 64.0 in | Wt 154.1 lb

## 2024-03-08 DIAGNOSIS — Z981 Arthrodesis status: Secondary | ICD-10-CM

## 2024-03-08 NOTE — Progress Notes (Signed)
" ° °  REFERRING PHYSICIAN:  Dineen Rollene MATSU, [name] [address] [place],  [place] [address]  DOS: 02/17/24   ACDF C4-C6  HISTORY OF PRESENT ILLNESS: Benjamin Conley is 2 weeks status post above surgery. Given oxycodone  and robaxin  on discharge from the hospital.  Today he reports ongoing neck pain particularly in the right shoulder but he feels to be managed okay with Robaxin .  He is not currently taking any medications for pain.  He does note some improvement in his gait.  He has only been taking tylenol  for pain and prn robaxin .    PHYSICAL EXAMINATION:  NEUROLOGICAL:  General: In no acute distress.   Awake, alert, oriented to person, place, and time.  Pupils equal round and reactive to light.  Facial tone is symmetric.    Strength: Side Biceps Triceps Deltoid Interossei Grip Wrist Ext. Wrist Flex.  R 4 4+ 4- 4- 4 4+ 5  L 4 4+ 4- 4 4+ 4+ 5   Side Iliopsoas Quads Hamstring PF DF EHL  R 5 5 5 5 5 5   L 5 5 5 5 5 5    Incision c/d/I and healing well   Imaging:  Nothing new to review.   Assessment / Plan: Benjamin Conley is doing well s/p above surgery. We discussed activity escalation and I have advised the patient to lift up to 10 pounds until 6 weeks after surgery (until follow up with Dr. Claudene).  He can return to driving if he feels he can do so safely.  Advised to contact the office if any questions or concerns arise.   Edsel Goods PA-C Dept of Neurosurgery  "

## 2024-03-15 NOTE — Progress Notes (Signed)
 Benjamin Conley                                          MRN: [id number]   03/15/2024   The [facility] Quality Team Specialist reviewed this patient medical record for the purposes of chart review for care gap closure. The following were reviewed: abstraction for care gap closure-glycemic status assessment.    [facility] Quality Team

## 2024-03-16 LAB — OPHTHALMOLOGY REPORT-SCANNED

## 2024-03-18 ENCOUNTER — Other Ambulatory Visit: Payer: Self-pay | Admitting: Family Medicine

## 2024-03-18 DIAGNOSIS — M47812 Spondylosis without myelopathy or radiculopathy, cervical region: Secondary | ICD-10-CM

## 2024-03-21 ENCOUNTER — Encounter: Payer: Self-pay | Admitting: Family

## 2024-03-22 ENCOUNTER — Other Ambulatory Visit: Payer: Self-pay

## 2024-03-22 DIAGNOSIS — R972 Elevated prostate specific antigen [PSA]: Secondary | ICD-10-CM

## 2024-03-29 ENCOUNTER — Encounter: Payer: Self-pay | Admitting: Neurosurgery

## 2024-03-29 ENCOUNTER — Other Ambulatory Visit: Payer: Self-pay | Admitting: Family

## 2024-03-29 ENCOUNTER — Ambulatory Visit

## 2024-03-29 ENCOUNTER — Ambulatory Visit: Admitting: Neurosurgery

## 2024-03-29 VITALS — BP 108/66 | Temp 97.8°F | Ht 64.0 in | Wt 154.0 lb

## 2024-03-29 DIAGNOSIS — Z981 Arthrodesis status: Secondary | ICD-10-CM | POA: Insufficient documentation

## 2024-03-29 DIAGNOSIS — T7589XA Other specified effects of external causes, initial encounter: Secondary | ICD-10-CM

## 2024-03-29 DIAGNOSIS — Z09 Encounter for follow-up examination after completed treatment for conditions other than malignant neoplasm: Secondary | ICD-10-CM

## 2024-03-29 DIAGNOSIS — M4712 Other spondylosis with myelopathy, cervical region: Secondary | ICD-10-CM

## 2024-03-29 DIAGNOSIS — M47812 Spondylosis without myelopathy or radiculopathy, cervical region: Secondary | ICD-10-CM

## 2024-03-29 NOTE — Progress Notes (Signed)
" ° °  REFERRING PHYSICIAN:  Dineen Rollene MATSU, [name] [address] [place],  [place] [address]  DOS: 02/17/24   ACDF C4-C6  Discussed the use of AI scribe software for clinical note transcription with the patient, who gave verbal consent to proceed.  History of Present Illness Trevonne Leopold Conley is a 59 year old male with cervical stenosis and myelopathy, status post anterior cervical discectomy and fusion at C4-C6, who presents for post-operative follow-up. He has persistent burning, aching pain in both shoulders radiating into the arms, which remains his main concern although he states that this is mild.He ambulates with a cane and reports improved gait stability since surgery, feeling less wobbly but still needing the cane for support. He denies dysphagia and does not request medication changes.  PHYSICAL EXAMINATION:  NEUROLOGICAL:  General: In no acute distress.   Awake, alert, oriented to person, place, and time.  Pupils equal round and reactive to light.  Facial tone is symmetric.    Strength: Side Biceps Triceps Deltoid Interossei Grip Wrist Ext. Wrist Flex.  R 4 4+ 4- 4 4 4+ 5  L 4 4+ 4- 4 4+ 4+ 5   Side Iliopsoas Quads Hamstring PF DF EHL  R 5 5 5 5 5 5   L 5 5 5 5 5 5    Incision c/d/I and healing well   Imaging:  No evidence of acute hardware failure.  Awaiting final reads.  Assessment and Plan Assessment & Plan Severe cervical stenosis with myelopathy and myelomalacia, C4-C6 Status post anterior cervical decompression and fusion at C4-C6 for severe stenosis with myelopathy and myelomalacia. He is in the expected recovery phase with improvement in gait and stability, though ambulation still requires a cane. There is no indication of hardware migration or surgical complications at this time. Gradual neurological improvement is anticipated as healing progresses. - Scheduled follow-up in one month to assess recovery and neurological status. - Provided reassurance  regarding expected gradual improvement in gait and stability postoperatively.  Bilateral shoulder weakness and limited range of motion Persistent burning and aching in both shoulders radiating into the arms is consistent with postoperative recovery following cervical decompression. These symptoms are expected during the healing phase and do not indicate new neurological deficits. - Provided reassurance that shoulder pain and weakness are expected during recovery.  Benjamin Conley Sharps MD  "

## 2024-03-30 ENCOUNTER — Other Ambulatory Visit

## 2024-03-30 DIAGNOSIS — R972 Elevated prostate specific antigen [PSA]: Secondary | ICD-10-CM

## 2024-03-30 NOTE — Telephone Encounter (Signed)
 Spoke to pt he is just concerned about the septic system at their house  does not need an appt and is wondering is he breathing in fumes from there causing him to be dizzy and sob at times

## 2024-04-01 LAB — PHI SCORE REFLEX
% Free PSA: 38.1 %
PSA, Free: 3.52 ng/mL
Prostate Heath Index Score: 15.9
p2PSA: 18.4 pg/mL

## 2024-04-01 LAB — PROSTATE HEALTH INDEX: Prostate Specific Ag: 9.2 ng/mL — ABNORMAL HIGH (ref 0.0–3.9)

## 2024-04-03 ENCOUNTER — Ambulatory Visit: Payer: Self-pay | Admitting: Neurosurgery

## 2024-04-05 ENCOUNTER — Telehealth: Payer: Self-pay | Admitting: Family

## 2024-04-05 ENCOUNTER — Other Ambulatory Visit: Payer: Self-pay | Admitting: Family

## 2024-04-05 ENCOUNTER — Ambulatory Visit: Admitting: Urology

## 2024-04-05 DIAGNOSIS — I951 Orthostatic hypotension: Secondary | ICD-10-CM

## 2024-04-05 NOTE — Telephone Encounter (Signed)
-----   Message from [name], MD sent at 03/30/2024 10:54 AM EST ----- Putting my engineering hat on... Most of the symptoms that he would experience would be more generalized.  Not necessarily specific to the lung.  He needs to have his septic tank professional check and inspect the septic tank thoroughly.  He can also contact the Health Department for more information ([email or web address]).  Dr. [name]. ----- Message ----- From: Dineen Rollene [name], [name] Sent: 03/29/2024  10:50 PM EST To: Dedra [name], MD  [name],   Sorry to bother you, I am just not sure where to go with this.    [name] sent me the below MyChart message. You last saw him in September of last year.    In brief reading, looks like gases Hydrogen sulfide, methane, carbon dioxide, and ammonia would be of concern with a septic tank.   I also do not know what specific symptoms he is referring to.   I offered infectious disease consult... Any direction from you would be appreciated.      Hi Ms. Benjamin Conley, You know Jesus been dealing with these ongoing symptoms for a long time. Now that Ive learned the septic issues at my home have likely been going on for years, what do you think about checking for any possible environmental exposure that could be contributing?   Thank you, Benjamin Conley

## 2024-04-05 NOTE — Telephone Encounter (Signed)
 Sent mychart to pt.

## 2024-04-07 NOTE — Progress Notes (Unsigned)
 "   04/08/2024 4:11 PM   Benjamin Conley 21-Dec-1965 [id number]  Referring provider: Dineen Rollene MATSU, [name] [address] [place],  [place] [address]  Urological history: 1. ED -sildenafil  100 mg, on-demand-dosing  2. BPH with LU TS -cysto (2021) prominent lateral lobe enlargement of prostate and moderate bladder neck elevation -prostate volume 47 grams -tamsulosin  0.4 mg daily - discontinued secondary to drop in BP  3. Ejaculatory disorder -retrograde ejaculation secondary to BPH and tamsulosin    4. Elevated PSA  -PSA (03/2024) 9.2 -prostate biopsy (07/2021) for PSA 4.1 - negative   No chief complaint on file.   HPI: Benjamin Conley is a 59 y.o. male who presents today for follow up.    Previous records reviewed.   I PSS ***  - Irritative symptoms: frequency, urgency, nocturia  ___*** - Obstructive symptoms: weak stream, hesitancy, intermittency, straining, incomplete emptying *** - Incontinence: none/post-void dribbling/urge incontinence/stress incontinence/mixed incontinence, and # of pads *** - Symptom progression: stable / worsening *** - Current therapy: ?-blocker? 5-ARI? (document) *** - Response to therapy: partial / good / inadequate *** - Denies: gross hematuria, dysuria, flank pain, fever, chills, retention. *** - Quality of life: moderate bother from urinary symptoms ***  UA (09/2023) clear   PVR***  Serum creatinine (01/2024) 0.66  Hemoglobin A1c (12/2023) 7.4   SHIM ***  - ED: persistent difficulty achieving/maintaining erections. Previously tried ___ (PDE5i, vacuum device, etc.) with ___ benefit. Libido stable. No penile pain or curvature.  Spontaneous erections. No spontaneous erections.  *** - Comorbidities: DM2 and HTN reasonably controlled per patient report. ***  Testosterone level ***  Cholesterol ***  Hemoglobin A1c ***  TSH ***    PMH: Past Medical History:  Diagnosis Date   Acute posthemorrhagic anemia     Allergy 2015   Aortic atherosclerosis    Arthritis    Asthma 2008   Bilateral carotid artery disease    BPH (benign prostatic hyperplasia)    Complication of anesthesia    c/o difficulty breathing after anesthesia   COPD (chronic obstructive pulmonary disease) (HCC)    Coronary artery disease    Dissection of left iliac artery    GERD (gastroesophageal reflux disease)    Hyperlipemia    Labile hypertension    Long term current use of clopidogrel     Long-term use of aspirin  therapy    Melena 08/21/2019   PAD (peripheral artery disease)    Paroxysmal supraventricular tachycardia 08/19/2019   Postural dizziness with presyncope 08/21/2019   Renal disorder    early stage CKD   Splenic infarction s/p stent placement 07/2019   T2DM (type 2 diabetes mellitus) (HCC)    Tobacco abuse    Ulcer    Upper GI bleed 08/21/2019    Surgical History: Past Surgical History:  Procedure Laterality Date   ANTERIOR CERVICAL DECOMP/DISCECTOMY FUSION N/A 02/17/2024   Procedure: Anterior cervical discectomy and fusion, C4-C6;  Surgeon: Claudene Penne ORN, MD;  Location: [facility];  Service: Neurosurgery;  Laterality: N/A;   BACK SURGERY     lumbar   COLONOSCOPY WITH PROPOFOL  N/A 08/24/2019   Procedure: COLONOSCOPY WITH PROPOFOL ;  Surgeon: Jinny Carmine, MD;  Location: [facility];  Service: Endoscopy;  Laterality: N/A;   ESOPHAGOGASTRODUODENOSCOPY (EGD) WITH PROPOFOL  N/A 08/24/2019   Procedure: ESOPHAGOGASTRODUODENOSCOPY (EGD) WITH PROPOFOL ;  Surgeon: Jinny Carmine, MD;  Location: [facility];  Service: Endoscopy;  Laterality: N/A;   SPINE SURGERY  2008   STENT PLACEMENT VASCULAR ([facility] HX)  07/2019  stenosis of distal splenic artery and stent placed   VISCERAL ANGIOGRAPHY N/A 08/06/2019   Procedure: VISCERAL ANGIOGRAPHY;  Surgeon: Jama Cordella MATSU, MD;  Location: [facility];  Service: Cardiovascular;  Laterality: N/A;    Home Medications:  Allergies as of 04/08/2024       Reactions    Wasp Venom Anaphylaxis   Wasp Venom Protein Anaphylaxis   Coffee Flavoring Agent (non-screening) Nausea And Vomiting   Patient states coffee gives him nausea and stomach cramps   Onion Nausea And Vomiting, Nausea Only   Prednisone  Other (See Comments)   DKA after IV solu medrol  following by oral prednisone         Medication List        Accurate as of April 07, 2024  4:11 PM. If you have any questions, ask your nurse or doctor.          Accu-Chek Guide Test test strip Generic drug: glucose blood USE 1  IN THE MORNING THEN 1 AT NOON AND 1 AT BEDTIME   acetaminophen  500 MG tablet Commonly known as: TYLENOL  Take 2 tablets (1,000 mg total) by mouth every 6 (six) hours as needed.   albuterol  108 (90 Base) MCG/ACT inhaler Commonly known as: VENTOLIN  HFA Inhale 2 puffs into the lungs every 6 (six) hours as needed for wheezing or shortness of breath.   aspirin  EC 81 MG tablet Take 1 tablet (81 mg total) by mouth daily.   atorvastatin  40 MG tablet Commonly known as: LIPITOR Take 0.5 tablets (20 mg total) by mouth daily.   blood glucose meter kit and supplies Dispense based on patient and insurance preference. Use up to four times daily as directed. (FOR ICD-10 E10.9, E11.9).   Blood Glucose Monitoring Suppl Devi 1 each by Does not apply route in the morning, at noon, and at bedtime. May substitute to any manufacturer covered by patient's insurance.   budesonide -glycopyrrolate -formoterol  160-9-4.8 MCG/ACT Aero inhaler Commonly known as: BREZTRI  Inhale 2 puffs into the lungs 2 (two) times daily. What changed:  when to take this reasons to take this   clopidogrel  75 MG tablet Commonly known as: PLAVIX  Take 1 tablet by mouth once daily   Dexcom G7 Sensor Misc CHECK SUGARS CONTINUOUSLY   digoxin  0.125 MG tablet Commonly known as: LANOXIN  Take 0.125 mg by mouth in the morning.   empagliflozin  25 MG Tabs tablet Commonly known as: Jardiance  Take 1 tablet (25 mg  total) by mouth daily before breakfast.   midodrine  10 MG tablet Commonly known as: PROAMATINE  Take 10 mg by mouth in the morning.   Ozempic  (2 MG/DOSE) 8 MG/3ML Sopn Generic drug: Semaglutide  (2 MG/DOSE) INJECT 2MG  SUBCUTANEOUSLY ONCE WEEKLY What changed:  how much to take how to take this when to take this   triamcinolone  cream 0.1 % Commonly known as: KENALOG  Apply 1 application topically 2 (two) times daily. What changed:  when to take this reasons to take this        Allergies:  Allergies  Allergen Reactions   Wasp Venom Anaphylaxis   Wasp Venom Protein Anaphylaxis   Coffee Flavoring Agent (Non-Screening) Nausea And Vomiting    Patient states coffee gives him nausea and stomach cramps   Onion Nausea And Vomiting and Nausea Only   Prednisone  Other (See Comments)    DKA after IV solu medrol  following by oral prednisone     Family History: Family History  Problem Relation Age of Onset   Diabetes Mother    Diabetes Mellitus II  Mother    Obesity Mother    Tuberculosis Father    Heart disease Father    Diabetes Father    Diabetes Sister    Diabetes Sister    Diabetes Sister    Diabetes Sister    Diabetes Brother    Diabetes Mellitus II Brother    Hyperlipidemia Brother    Obesity Brother    Cancer Maternal Aunt    Colon cancer Maternal Uncle    Obesity Sister    Obesity Sister    Obesity Sister    Obesity Sister    Prostate cancer Neg Hx     Social History:  reports that he quit smoking about 4 years ago. His smoking use included cigarettes. He started smoking about 52 years ago. He has a 96 pack-year smoking history. He has never used smokeless tobacco. He reports that he does not drink alcohol and does not use drugs.   Physical Exam: There were no vitals taken for this visit.  Constitutional:  Well nourished. Alert and oriented, No acute distress. HEENT: [place] AT, moist mucus membranes.  Trachea midline, no masses. Cardiovascular: No clubbing,  cyanosis, or edema. Respiratory: Normal respiratory effort, no increased work of breathing. GI: Abdomen is soft, non tender, non distended, no abdominal masses. Liver and spleen not palpable.  No hernias appreciated.  Stool sample for occult testing is not indicated.   GU: No CVA tenderness.  No bladder fullness or masses.  Patient with circumcised/uncircumcised phallus. ***Foreskin easily retracted***  Urethral meatus is patent.  No penile discharge. No penile lesions or rashes. Scrotum without lesions, cysts, rashes and/or edema.  Testicles are located scrotally bilaterally. No masses are appreciated in the testicles. Left and right epididymis are normal. Rectal: Patient with  normal sphincter tone. Anus and perineum without scarring or rashes. No rectal masses are appreciated. Prostate is approximately *** grams, *** nodules are appreciated. Seminal vesicles are normal. Skin: No rashes, bruises or suspicious lesions. Lymph: No cervical or inguinal adenopathy. Neurologic: Grossly intact, no focal deficits, moving all 4 extremities. Psychiatric: Normal mood and affect.   Laboratory data: See Epic and HPI    I have reviewed the labs.  Pertinent Imaging: N/A   Assessment & Plan:    1. BPH with LU TS - continue silodosin  8 mg daily   2. Prostate nodule/Elevated PSA - discussed the PHI results noted a low probability with having prostate cancer at this time, but this increase is quite precipitous - discussed repeating PHI in three months, obtaining a prostate MRI or proceeding with biopsy   3.  Erectile dysfunction -not sexually active   CLOTILDA CORNWALL, PA-C  [facility] [address] [place], [place] [address] [phone] "

## 2024-04-08 ENCOUNTER — Encounter: Payer: Self-pay | Admitting: Urology

## 2024-04-08 ENCOUNTER — Ambulatory Visit: Admitting: Urology

## 2024-04-08 ENCOUNTER — Encounter: Payer: Self-pay | Admitting: Oncology

## 2024-04-08 VITALS — BP 106/71 | HR 118 | Ht 65.0 in | Wt 155.0 lb

## 2024-04-08 DIAGNOSIS — R972 Elevated prostate specific antigen [PSA]: Secondary | ICD-10-CM

## 2024-04-08 DIAGNOSIS — N529 Male erectile dysfunction, unspecified: Secondary | ICD-10-CM

## 2024-04-08 DIAGNOSIS — N401 Enlarged prostate with lower urinary tract symptoms: Secondary | ICD-10-CM | POA: Diagnosis not present

## 2024-04-08 DIAGNOSIS — N138 Other obstructive and reflux uropathy: Secondary | ICD-10-CM

## 2024-04-11 ENCOUNTER — Other Ambulatory Visit: Payer: Self-pay | Admitting: Family

## 2024-04-11 DIAGNOSIS — E119 Type 2 diabetes mellitus without complications: Secondary | ICD-10-CM

## 2024-04-16 ENCOUNTER — Encounter: Payer: Self-pay | Admitting: Family

## 2024-04-20 ENCOUNTER — Ambulatory Visit: Admitting: Infectious Diseases

## 2024-05-03 ENCOUNTER — Encounter: Admitting: Orthopedic Surgery

## 2024-05-03 ENCOUNTER — Other Ambulatory Visit

## 2024-05-05 ENCOUNTER — Other Ambulatory Visit

## 2024-07-05 ENCOUNTER — Other Ambulatory Visit

## 2024-07-16 ENCOUNTER — Encounter (INDEPENDENT_AMBULATORY_CARE_PROVIDER_SITE_OTHER)

## 2024-07-16 ENCOUNTER — Ambulatory Visit (INDEPENDENT_AMBULATORY_CARE_PROVIDER_SITE_OTHER): Admitting: Vascular Surgery

## 2024-11-02 ENCOUNTER — Ambulatory Visit
